# Patient Record
Sex: Male | Born: 1945
Health system: Southern US, Community
[De-identification: ages and names within clinical notes are randomized; demographics above are authoritative.]

## PROBLEM LIST (undated history)

## (undated) DIAGNOSIS — I679 Cerebrovascular disease, unspecified: Secondary | ICD-10-CM

## (undated) DIAGNOSIS — K635 Polyp of colon: Secondary | ICD-10-CM

## (undated) DIAGNOSIS — N529 Male erectile dysfunction, unspecified: Secondary | ICD-10-CM

## (undated) DIAGNOSIS — E663 Overweight: Secondary | ICD-10-CM

## (undated) DIAGNOSIS — D689 Coagulation defect, unspecified: Secondary | ICD-10-CM

## (undated) DIAGNOSIS — J189 Pneumonia, unspecified organism: Secondary | ICD-10-CM

## (undated) DIAGNOSIS — K5790 Diverticulosis of intestine, part unspecified, without perforation or abscess without bleeding: Secondary | ICD-10-CM

## (undated) DIAGNOSIS — L719 Rosacea, unspecified: Secondary | ICD-10-CM

## (undated) DIAGNOSIS — I739 Peripheral vascular disease, unspecified: Secondary | ICD-10-CM

## (undated) DIAGNOSIS — F17201 Nicotine dependence, unspecified, in remission: Secondary | ICD-10-CM

## (undated) DIAGNOSIS — I2510 Atherosclerotic heart disease of native coronary artery without angina pectoris: Secondary | ICD-10-CM

## (undated) DIAGNOSIS — I10 Essential (primary) hypertension: Secondary | ICD-10-CM

## (undated) DIAGNOSIS — E538 Deficiency of other specified B group vitamins: Secondary | ICD-10-CM

## (undated) DIAGNOSIS — K219 Gastro-esophageal reflux disease without esophagitis: Secondary | ICD-10-CM

## (undated) DIAGNOSIS — M1990 Unspecified osteoarthritis, unspecified site: Secondary | ICD-10-CM

## (undated) DIAGNOSIS — Z8489 Family history of other specified conditions: Secondary | ICD-10-CM

## (undated) DIAGNOSIS — D649 Anemia, unspecified: Secondary | ICD-10-CM

## (undated) DIAGNOSIS — K921 Melena: Secondary | ICD-10-CM

## (undated) DIAGNOSIS — E785 Hyperlipidemia, unspecified: Secondary | ICD-10-CM

## (undated) HISTORY — DX: Overweight: E66.3

## (undated) HISTORY — DX: Polyp of colon: K63.5

## (undated) HISTORY — PX: APPENDECTOMY: SHX54

## (undated) HISTORY — DX: Unspecified osteoarthritis, unspecified site: M19.90

## (undated) HISTORY — DX: Nicotine dependence, unspecified, in remission: F17.201

## (undated) HISTORY — DX: Deficiency of other specified B group vitamins: E53.8

## (undated) HISTORY — DX: Rosacea, unspecified: L71.9

## (undated) HISTORY — DX: Peripheral vascular disease, unspecified: I73.9

## (undated) HISTORY — DX: Male erectile dysfunction, unspecified: N52.9

## (undated) HISTORY — DX: Cerebrovascular disease, unspecified: I67.9

## (undated) HISTORY — DX: Anemia, unspecified: D64.9

## (undated) HISTORY — DX: Gastro-esophageal reflux disease without esophagitis: K21.9

## (undated) HISTORY — DX: Hyperlipidemia, unspecified: E78.5

## (undated) HISTORY — DX: Diverticulosis of intestine, part unspecified, without perforation or abscess without bleeding: K57.90

## (undated) HISTORY — DX: Atherosclerotic heart disease of native coronary artery without angina pectoris: I25.10

## (undated) HISTORY — DX: Coagulation defect, unspecified: D68.9

## (undated) HISTORY — DX: Pneumonia, unspecified organism: J18.9

## (undated) HISTORY — DX: Melena: K92.1

---

## 1991-05-09 DIAGNOSIS — I2510 Atherosclerotic heart disease of native coronary artery without angina pectoris: Secondary | ICD-10-CM

## 1991-05-09 HISTORY — DX: Atherosclerotic heart disease of native coronary artery without angina pectoris: I25.10

## 2000-12-25 ENCOUNTER — Ambulatory Visit (HOSPITAL_COMMUNITY): Admission: RE | Admit: 2000-12-25 | Discharge: 2000-12-25 | Payer: Self-pay | Admitting: Cardiology

## 2004-03-03 ENCOUNTER — Inpatient Hospital Stay (HOSPITAL_COMMUNITY): Admission: EM | Admit: 2004-03-03 | Discharge: 2004-03-04 | Payer: Self-pay | Admitting: Emergency Medicine

## 2004-03-03 ENCOUNTER — Encounter (INDEPENDENT_AMBULATORY_CARE_PROVIDER_SITE_OTHER): Payer: Self-pay | Admitting: *Deleted

## 2004-04-27 ENCOUNTER — Ambulatory Visit: Payer: Self-pay | Admitting: Internal Medicine

## 2004-04-27 LAB — CONVERTED CEMR LAB: PSA: 0.33 ng/mL

## 2004-05-16 ENCOUNTER — Ambulatory Visit: Payer: Self-pay | Admitting: Internal Medicine

## 2005-02-14 ENCOUNTER — Ambulatory Visit: Payer: Self-pay | Admitting: Cardiology

## 2005-03-23 ENCOUNTER — Ambulatory Visit: Payer: Self-pay | Admitting: Family Medicine

## 2005-04-28 ENCOUNTER — Ambulatory Visit: Payer: Self-pay | Admitting: Internal Medicine

## 2005-05-22 ENCOUNTER — Ambulatory Visit: Payer: Self-pay | Admitting: Internal Medicine

## 2005-06-01 ENCOUNTER — Ambulatory Visit: Payer: Self-pay | Admitting: Family Medicine

## 2006-02-23 ENCOUNTER — Ambulatory Visit: Payer: Self-pay | Admitting: Cardiology

## 2006-11-30 ENCOUNTER — Encounter: Payer: Self-pay | Admitting: Internal Medicine

## 2006-11-30 DIAGNOSIS — L719 Rosacea, unspecified: Secondary | ICD-10-CM

## 2006-11-30 DIAGNOSIS — K219 Gastro-esophageal reflux disease without esophagitis: Secondary | ICD-10-CM

## 2006-12-07 ENCOUNTER — Ambulatory Visit: Payer: Self-pay | Admitting: Internal Medicine

## 2006-12-07 DIAGNOSIS — E785 Hyperlipidemia, unspecified: Secondary | ICD-10-CM | POA: Insufficient documentation

## 2006-12-07 DIAGNOSIS — E782 Mixed hyperlipidemia: Secondary | ICD-10-CM | POA: Insufficient documentation

## 2006-12-07 LAB — CONVERTED CEMR LAB
BUN: 15 mg/dL (ref 6–23)
CO2: 30 meq/L (ref 19–32)
Cholesterol: 90 mg/dL (ref 0–200)
Glucose, Bld: 101 mg/dL — ABNORMAL HIGH (ref 70–99)
LDL Cholesterol: 33 mg/dL (ref 0–99)
Phosphorus: 3.6 mg/dL (ref 2.3–4.6)
Potassium: 4.8 meq/L (ref 3.5–5.1)
Sodium: 141 meq/L (ref 135–145)
Total CHOL/HDL Ratio: 2.1
Triglycerides: 73 mg/dL (ref 0–149)

## 2007-01-17 ENCOUNTER — Ambulatory Visit: Payer: Self-pay | Admitting: Internal Medicine

## 2007-01-18 ENCOUNTER — Encounter (INDEPENDENT_AMBULATORY_CARE_PROVIDER_SITE_OTHER): Payer: Self-pay | Admitting: *Deleted

## 2007-01-18 ENCOUNTER — Ambulatory Visit (HOSPITAL_COMMUNITY): Admission: RE | Admit: 2007-01-18 | Discharge: 2007-01-18 | Payer: Self-pay | Admitting: Cardiology

## 2007-01-18 ENCOUNTER — Ambulatory Visit: Payer: Self-pay | Admitting: Cardiology

## 2007-06-24 ENCOUNTER — Telehealth (INDEPENDENT_AMBULATORY_CARE_PROVIDER_SITE_OTHER): Payer: Self-pay | Admitting: *Deleted

## 2007-12-23 ENCOUNTER — Telehealth (INDEPENDENT_AMBULATORY_CARE_PROVIDER_SITE_OTHER): Payer: Self-pay | Admitting: *Deleted

## 2008-01-31 ENCOUNTER — Ambulatory Visit: Payer: Self-pay | Admitting: Cardiology

## 2008-02-03 ENCOUNTER — Telehealth: Payer: Self-pay | Admitting: Internal Medicine

## 2008-02-21 ENCOUNTER — Ambulatory Visit: Payer: Self-pay | Admitting: Internal Medicine

## 2008-02-21 DIAGNOSIS — M159 Polyosteoarthritis, unspecified: Secondary | ICD-10-CM | POA: Insufficient documentation

## 2008-02-21 DIAGNOSIS — M170 Bilateral primary osteoarthritis of knee: Secondary | ICD-10-CM

## 2008-02-26 LAB — CONVERTED CEMR LAB
ALT: 28 units/L (ref 0–53)
Alkaline Phosphatase: 40 units/L (ref 39–117)
BUN: 18 mg/dL (ref 6–23)
Bilirubin, Direct: 0.1 mg/dL (ref 0.0–0.3)
Chloride: 102 meq/L (ref 96–112)
Creatinine, Ser: 1 mg/dL (ref 0.4–1.5)
Eosinophils Relative: 2.2 % (ref 0.0–5.0)
GFR calc Af Amer: 97 mL/min
Glucose, Bld: 99 mg/dL (ref 70–99)
HDL: 40.4 mg/dL (ref >39.0)
LDL Cholesterol: 32 mg/dL (ref 0–99)
Monocytes Relative: 11 % (ref 3.0–12.0)
Neutrophils Relative %: 54.9 % (ref 43.0–77.0)
Phosphorus: 3.8 mg/dL (ref 2.3–4.6)
Platelets: 246 10*3/uL (ref 150–400)
RDW: 13.2 % (ref 11.5–14.6)
TSH: 0.82 microintl units/mL (ref 0.35–5.50)
Total Bilirubin: 0.7 mg/dL (ref 0.3–1.2)
Total CHOL/HDL Ratio: 2.1
Total Protein: 7.2 g/dL (ref 6.0–8.3)
VLDL: 13 mg/dL (ref 0–40)
WBC: 7.7 10*3/uL (ref 4.5–10.5)

## 2008-05-25 ENCOUNTER — Ambulatory Visit: Payer: Self-pay | Admitting: Internal Medicine

## 2008-12-07 ENCOUNTER — Ambulatory Visit: Payer: Self-pay | Admitting: Internal Medicine

## 2009-02-01 ENCOUNTER — Encounter: Payer: Self-pay | Admitting: Cardiology

## 2009-03-05 ENCOUNTER — Encounter: Payer: Self-pay | Admitting: Cardiology

## 2009-03-08 ENCOUNTER — Ambulatory Visit: Payer: Self-pay | Admitting: Cardiology

## 2009-03-08 DIAGNOSIS — E669 Obesity, unspecified: Secondary | ICD-10-CM

## 2009-03-15 ENCOUNTER — Ambulatory Visit (HOSPITAL_COMMUNITY): Admission: RE | Admit: 2009-03-15 | Discharge: 2009-03-15 | Payer: Self-pay | Admitting: Cardiology

## 2009-03-18 ENCOUNTER — Telehealth (INDEPENDENT_AMBULATORY_CARE_PROVIDER_SITE_OTHER): Payer: Self-pay | Admitting: *Deleted

## 2009-03-18 ENCOUNTER — Encounter (INDEPENDENT_AMBULATORY_CARE_PROVIDER_SITE_OTHER): Payer: Self-pay | Admitting: *Deleted

## 2009-04-07 ENCOUNTER — Encounter (INDEPENDENT_AMBULATORY_CARE_PROVIDER_SITE_OTHER): Payer: Self-pay | Admitting: *Deleted

## 2009-04-13 ENCOUNTER — Encounter: Payer: Self-pay | Admitting: Cardiology

## 2009-04-13 ENCOUNTER — Telehealth (INDEPENDENT_AMBULATORY_CARE_PROVIDER_SITE_OTHER): Payer: Self-pay | Admitting: *Deleted

## 2009-05-04 ENCOUNTER — Encounter (INDEPENDENT_AMBULATORY_CARE_PROVIDER_SITE_OTHER): Payer: Self-pay | Admitting: *Deleted

## 2009-05-04 ENCOUNTER — Ambulatory Visit: Payer: Self-pay | Admitting: Internal Medicine

## 2009-05-04 LAB — CONVERTED CEMR LAB
AST: 26 units/L
Alkaline Phosphatase: 33 units/L
Cholesterol: 96 mg/dL
Eosinophils Absolute: 0.1 10*3/uL
HCT: 43.1 %
LDL Cholesterol: 30 mg/dL
Lymphs Abs: 2.3 10*3/uL
MCV: 109.8 fL
Monocytes Absolute: 0.9 10*3/uL
Monocytes Relative: 11.3 %
Platelets: 257 10*3/uL
RBC: 3.92 M/uL
Total Protein: 7.5 g/dL
Triglycerides: 89 mg/dL
WBC: 8.4 10*3/uL

## 2009-05-05 LAB — CONVERTED CEMR LAB
AST: 26 units/L (ref 0–37)
BUN: 14 mg/dL (ref 6–23)
Basophils Relative: 0.9 % (ref 0.0–3.0)
CO2: 30 meq/L (ref 19–32)
Chloride: 104 meq/L (ref 96–112)
Eosinophils Relative: 1.7 % (ref 0.0–5.0)
GFR calc non Af Amer: 79.68 mL/min (ref >60)
HCT: 43.1 % (ref 39.0–52.0)
HDL: 48.2 mg/dL (ref >39.00)
Hemoglobin: 14.3 g/dL (ref 13.0–17.0)
LDL Cholesterol: 30 mg/dL (ref 0–99)
Lymphocytes Relative: 27.9 % (ref 12.0–46.0)
Lymphs Abs: 2.3 10*3/uL (ref 0.7–4.0)
Monocytes Relative: 11.3 % (ref 3.0–12.0)
Neutro Abs: 5 10*3/uL (ref 1.4–7.7)
PSA: 0.42 ng/mL (ref 0.10–4.00)
Phosphorus: 3.7 mg/dL (ref 2.3–4.6)
Potassium: 4.2 meq/L (ref 3.5–5.1)
RBC: 3.92 M/uL — ABNORMAL LOW (ref 4.22–5.81)
RDW: 13.3 % (ref 11.5–14.6)
Total Bilirubin: 0.8 mg/dL (ref 0.3–1.2)
Total CHOL/HDL Ratio: 2
Triglycerides: 89 mg/dL (ref 0.0–149.0)
VLDL: 17.8 mg/dL (ref 0.0–40.0)

## 2009-05-26 ENCOUNTER — Ambulatory Visit: Payer: Self-pay | Admitting: Internal Medicine

## 2009-05-28 ENCOUNTER — Encounter: Payer: Self-pay | Admitting: Internal Medicine

## 2009-05-28 LAB — CONVERTED CEMR LAB: Fecal Occult Bld: NEGATIVE

## 2009-09-06 ENCOUNTER — Ambulatory Visit: Payer: Self-pay | Admitting: Family Medicine

## 2009-09-06 DIAGNOSIS — M7660 Achilles tendinitis, unspecified leg: Secondary | ICD-10-CM

## 2009-10-25 ENCOUNTER — Ambulatory Visit: Payer: Self-pay | Admitting: Family Medicine

## 2010-03-07 ENCOUNTER — Encounter (INDEPENDENT_AMBULATORY_CARE_PROVIDER_SITE_OTHER): Payer: Self-pay | Admitting: *Deleted

## 2010-03-10 ENCOUNTER — Encounter (INDEPENDENT_AMBULATORY_CARE_PROVIDER_SITE_OTHER): Payer: Self-pay | Admitting: *Deleted

## 2010-03-11 ENCOUNTER — Ambulatory Visit: Payer: Self-pay | Admitting: Cardiology

## 2010-03-11 DIAGNOSIS — I739 Peripheral vascular disease, unspecified: Secondary | ICD-10-CM

## 2010-03-11 HISTORY — DX: Peripheral vascular disease, unspecified: I73.9

## 2010-03-18 ENCOUNTER — Ambulatory Visit (HOSPITAL_COMMUNITY): Admission: RE | Admit: 2010-03-18 | Discharge: 2010-03-18 | Payer: Self-pay | Admitting: Cardiology

## 2010-03-24 ENCOUNTER — Telehealth (INDEPENDENT_AMBULATORY_CARE_PROVIDER_SITE_OTHER): Payer: Self-pay | Admitting: *Deleted

## 2010-05-06 ENCOUNTER — Ambulatory Visit: Payer: Self-pay | Admitting: Internal Medicine

## 2010-05-08 DIAGNOSIS — K5790 Diverticulosis of intestine, part unspecified, without perforation or abscess without bleeding: Secondary | ICD-10-CM

## 2010-05-08 HISTORY — PX: COLONOSCOPY W/ POLYPECTOMY: SHX1380

## 2010-05-08 HISTORY — DX: Diverticulosis of intestine, part unspecified, without perforation or abscess without bleeding: K57.90

## 2010-05-10 LAB — CONVERTED CEMR LAB
Albumin: 3.9 g/dL (ref 3.5–5.2)
Basophils Relative: 0.6 % (ref 0.0–3.0)
CO2: 28 meq/L (ref 19–32)
Calcium: 9.1 mg/dL (ref 8.4–10.5)
Chloride: 103 meq/L (ref 96–112)
Eosinophils Relative: 1.8 % (ref 0.0–5.0)
HCT: 41.3 % (ref 39.0–52.0)
HDL: 39.4 mg/dL (ref >39.00)
Hemoglobin: 13.7 g/dL (ref 13.0–17.0)
LDL Cholesterol: 38 mg/dL (ref 0–99)
Lymphs Abs: 2.5 10*3/uL (ref 0.7–4.0)
MCV: 110.1 fL — ABNORMAL HIGH (ref 78.0–100.0)
Monocytes Absolute: 1 10*3/uL (ref 0.1–1.0)
Neutro Abs: 5.1 10*3/uL (ref 1.4–7.7)
RBC: 3.75 M/uL — ABNORMAL LOW (ref 4.22–5.81)
Sodium: 138 meq/L (ref 135–145)
Total Bilirubin: 0.7 mg/dL (ref 0.3–1.2)
Total CHOL/HDL Ratio: 2
Triglycerides: 56 mg/dL (ref 0.0–149.0)
WBC: 8.8 10*3/uL (ref 4.5–10.5)

## 2010-05-11 ENCOUNTER — Ambulatory Visit
Admission: RE | Admit: 2010-05-11 | Discharge: 2010-05-11 | Payer: Self-pay | Source: Home / Self Care | Attending: Internal Medicine | Admitting: Internal Medicine

## 2010-05-11 ENCOUNTER — Other Ambulatory Visit: Payer: Self-pay | Admitting: Internal Medicine

## 2010-05-11 DIAGNOSIS — R718 Other abnormality of red blood cells: Secondary | ICD-10-CM | POA: Insufficient documentation

## 2010-05-13 LAB — VITAMIN B12: Vitamin B-12: 230 pg/mL (ref 211–911)

## 2010-05-24 ENCOUNTER — Other Ambulatory Visit: Payer: Self-pay | Admitting: Internal Medicine

## 2010-05-24 ENCOUNTER — Ambulatory Visit
Admission: RE | Admit: 2010-05-24 | Discharge: 2010-05-24 | Payer: Self-pay | Source: Home / Self Care | Attending: Internal Medicine | Admitting: Internal Medicine

## 2010-05-24 DIAGNOSIS — K921 Melena: Secondary | ICD-10-CM | POA: Insufficient documentation

## 2010-05-24 HISTORY — DX: Melena: K92.1

## 2010-05-24 LAB — FECAL OCCULT BLOOD, IMMUNOCHEMICAL: Fecal Occult Bld: POSITIVE

## 2010-06-09 NOTE — Assessment & Plan Note (Signed)
Summary: 1 yr f/u per checkout on 03/08/09/tg      Allergies Added:   Visit Type:  Nurse visit Primary Provider:  Dr. Alphonsus Sias   History of Present Illness: [name] returns to the office as scheduled for reassessment of widespread vascular disease and vascular risk factors.  Since his previous visit one year ago, he has done extremely well.  He is active, including performing yard work, without cardiopulmonary symptoms.  Specifically, he denies chest discomfort, dyspnea, orthopnea, PND, lightheadedness, syncope, and pedal edema.  Blood pressure control has been good.  Lipid profile showed extremely low cholesterol values when assessed one year ago.  Current Medications (verified): 1)  Pravachol 20 Mg  Tabs (Pravastatin Sodium) .... Take One By Mouth Once A Day 2)  Toprol Xl 100 Mg  Tb24 (Metoprolol Succinate) .... Take One By Mouth Once A Day 3)  Aspirin 81 Mg  Tbec (Aspirin) .... Take One By Mouth Once A Day 4)  Cimetidine 400 Mg  Tabs (Cimetidine) .... Take One By Mouth Two Times A Day As Needed 5)  Cialis 20 Mg  Tabs (Tadalafil) .... Use As Directed About 1 Hour Before Sex 6)  Fish Oil 1200 Mg Caps (Omega-3 Fatty Acids) .[name].. 1 Daily By Mouth 7)  Chlorthalidone 25 Mg Tabs (Chlorthalidone) .... Take One Half Tablet By Mouth Daily 8)  Nitrostat 0.4 Mg Subl (Nitroglycerin) .... Take As Directed For Chest Pain  Allergies (verified): 1)  Cardizem Cd (Diltiazem Hcl Coated Beads) 2)  Zocor (Simvastatin)  Comments:  Nurse/Medical Assistant: patient wants to discuss the diffrence between viagra and cialis and which you  think is better also stated that meds are correct he doesn't take glucosamine or  nitroglycerin patches any longer.  Past History:  PMH, FH, and Social History reviewed and updated.  Review of Systems       See history of present illness.  Vital Signs:  Patient profile:   65 year old male Weight:      240 pounds BMI:     34.56 Pulse rate:   81 /  minute BP sitting:   127 / 68  (right arm)  Vitals Entered By: Dreama Saa, CNA (March 11, 2010 3:09 PM)  Nutrition Counseling: Patient's BMI is greater than 25 and therefore counseled on weight management options.  Physical Exam  General:  Overweight; well developed; no acute distress Weight-246, 8 lb. 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  Problem # 2:  HYPERLIPIDEMIA (ICD-272.4) Extremely low values of total and LDL cholesterol on modest pharmacologic therapy, which will be continued.  CHOL: 96 (05/04/2009)   LDL: 30 (05/04/2009)   HDL: 48.20 (05/04/2009)   TG: 89.0 (05/04/2009)  Problem # 3:  HYPERTENSION (ICD-401.1) Blood pressure well controlled with current medications, which will be continued.  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  No specific therapy warranted unless he develops significant  claudication.  I will reassess this nice gentleman in one year.  Problem # 6:  OVERWEIGHT (ICD-278.02) Weight loss advised and diet discussed.  Other Orders: Carotid Duplex (Carotid Duplex)  Patient Instructions: 1)  Your physician recommends that you schedule a follow-up appointment in: 1 year 2)  Your physician recommends that you continue on your current medications as directed. Please refer to the Current Medication list given to you today. 3)  Your physician has requested that you have a carotid duplex. This test is an ultrasound of the carotid arteries in your neck. It looks at blood flow through these arteries that supply the brain with blood. Allow one hour for this exam. There are no restrictions or special instructions.

## 2010-06-09 NOTE — Assessment & Plan Note (Signed)
Summary: CHECK UP/CLE   Vital Signs:  Patient profile:   65 year old male Height:      69 inches Weight:      238 pounds O2 Sat:      92 % on Room air Temp:     98.4 degrees F oral Pulse rate:   76 / minute Pulse rhythm:   regular BP sitting:   130 / 62  (left arm) Cuff size:   large  Vitals Entered By: Mervin Hack CMA Duncan Dull) (May 06, 2010 8:37 AM)  O2 Flow:  Room air CC: adult physical   History of Present Illness: Doing okay  Recent cardiology eval  no problems Carotids were fine doesn't use NTG---taken off list (esp since he occ uses ciallis)  Having ongoing knee pain  takes aleve 220 two times a day  This helps and trying to exercise more now has tried tylenol  still working    Preventive Screening-Counseling & Management  Alcohol-Tobacco     Smoking Status: quit > 6 months  Allergies: 1)  Cardizem Cd (Diltiazem Hcl Coated Beads) 2)  Zocor (Simvastatin)  Past History:  Past medical, surgical, family and social histories (including risk factors) reviewed for relevance to current acute and chronic problems.  Past Medical History: Reviewed history from 03/08/2009 and no changes required. 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 Appendectomy  Family History: Reviewed history from 11/30/2006 and no changes required. Dad died @77  MI, HTN, TIA's Mom died @63  lung cancer HTN in siblings CAD in pat uncles/grandparents  Social History: Reviewed history from 12/07/2006 and no changes  required. Marital Status: Married Children: 3 Occupation: Now with [employer] in [employer] Former Smoker--quit 1992 Alcohol use-no Smoking Status:  quit > 6 months  Review of Systems General:  weight is up a few pounds sleeps okay--occ awakened by knee pain wears seat belt. Eyes:  Denies double vision and vision loss-1 eye. ENT:  Denies decreased hearing and ringing in ears; teeth okay--regular with dentist. CV:  Denies chest pain or discomfort, difficulty breathing at night, difficulty breathing while lying down, fainting, lightheadness, palpitations, and shortness of breath with exertion. Resp:  Denies cough and shortness of breath. GI:  Complains of indigestion; denies abdominal pain, bloody stools, change in bowel habits, dark tarry stools, nausea, and vomiting; occ uses tagamet. GU:  Complains of erectile dysfunction; denies nocturia, urinary frequency, and urinary hesitancy; cialis not that effective. MS:  Complains of joint pain; denies joint swelling. Derm:  Denies lesion(s) and rash. Neuro:  Denies headaches, numbness, and tingling. Psych:  Denies anxiety and depression. Heme:  Denies abnormal bruising and enlarge lymph nodes. Allergy:  Denies seasonal allergies and sneezing.  Physical Exam  General:  alert and normal appearance.   Eyes:  pupils equal, pupils round, pupils reactive to light, and no optic disk abnormalities.   Ears:  R ear normal and L ear normal.   Mouth:  no erythema, no exudates, and no lesions.   Neck:  supple, no masses, no thyromegaly, and no cervical lymphadenopathy.   Lungs:  normal respiratory effort, no intercostal retractions, no accessory muscle use, and normal breath sounds.   Heart:  normal rate,  regular rhythm, no murmur, and no gallop.   Abdomen:  soft, non-tender, and no masses.   Rectal:  deferred after discussion Msk:  no joint tenderness and no joint swelling.   Mild crepitus in left knee Pulses:  feet warm but without  pulses Extremities:  No edema Neurologic:  alert & oriented X3, strength normal in all extremities, and gait normal.   Skin:  no rashes and no suspicious lesions.   Axillary Nodes:  No palpable lymphadenopathy Psych:  normally interactive, good eye contact, not anxious appearing, and not depressed appearing.     Impression & Recommendations:  Problem # 1:  PREVENTIVE HEALTH CARE (ICD-V70.0) Assessment Comment Only  doing well but not overly fit discussed exercise and weight loss he will do stool immunoassay again discussed PSA--he would like to continue for now  Orders: TLB-PSA (Prostate Specific Antigen) (84153-PSA)  Problem # 2:  HYPERTENSION (ICD-401.1) Assessment: Unchanged  good control no changes needed  His updated medication list for this problem includes:    Toprol Xl 100 Mg Tb24 (Metoprolol succinate) .[name]... Take one by mouth once a day    Chlorthalidone 25 Mg Tabs (Chlorthalidone) .[name]... Take one half tablet by mouth daily  BP today: 130/62 Prior BP: 127/68 (03/11/2010)  Labs Reviewed: K+: 4.2 (05/04/2009) Creat: : 1.0 (05/04/2009)   Chol: 96 (05/04/2009)   HDL: 48.20 (05/04/2009)   LDL: 30 (05/04/2009)   TG: 89.0 (05/04/2009)  Orders: TLB-Renal Function Panel (80069-RENAL) TLB-CBC Platelet - w/Differential (85025-CBCD) TLB-TSH (Thyroid Stimulating Hormone) (84443-TSH)  Problem # 3:  HYPERLIPIDEMIA (ICD-272.4) Assessment: Unchanged  very low but approp to continue given vasculopathy  His updated medication list for this problem includes:    Pravachol 20 Mg Tabs (Pravastatin sodium) .[name]... Take one by mouth once a day  Labs Reviewed: SGOT: 26 (05/04/2009)   SGPT: 28 (05/04/2009)   HDL:48.20 (05/04/2009), 48.20 (05/04/2009)  LDL:30 (05/04/2009), 30 (05/04/2009)  Chol:96 (05/04/2009), 96 (05/04/2009)  Trig:89.0 (05/04/2009), 89.0 (05/04/2009)  Orders: TLB-Lipid Panel (80061-LIPID) TLB-Hepatic/Liver Function Pnl (80076-HEPATIC) Venipuncture  (30865)  Problem # 4:  OSTEOARTHRITIS (ICD-715.90) Assessment: Unchanged suggested tylenol instead of aleve for increased cardiac safety  Complete Medication List: 1)  Pravachol 20 Mg Tabs (Pravastatin sodium) .... Take one by mouth once a day 2)  Toprol Xl 100 Mg Tb24 (Metoprolol succinate) .... Take one by mouth once a day 3)  Cimetidine 400 Mg Tabs (Cimetidine) .... Take one by mouth two times a day as needed 4)  Cialis 20 Mg Tabs (Tadalafil) .... Use as directed about 1 hour before sex 5)  Chlorthalidone 25 Mg Tabs (Chlorthalidone) .... Take one half tablet by mouth daily 6)  Aspirin 81 Mg Tbec (Aspirin) .... Take one by mouth once a day 7)  Fish Oil 1200 Mg Caps (Omega-3 fatty acids) .[name].. 1 daily by mouth  Patient Instructions: 1)  Please try tylenol 650mg  three times a day instead of aleve----continue this if it works reasonably well 2)  Please schedule a follow-up appointment in 1 year.  3)  Complete your hemoccult cards and return them soon.    Orders Added: 1)  Est. Patient 65& > [99397] 2)  TLB-PSA (Prostate Specific Antigen) [84153-PSA] 3)  TLB-Lipid Panel [80061-LIPID] 4)  TLB-Hepatic/Liver Function Pnl [80076-HEPATIC] 5)  Venipuncture [36415] 6)  TLB-Renal Function Panel [80069-RENAL] 7)  TLB-CBC Platelet - w/Differential [85025-CBCD] 8)  TLB-TSH (Thyroid Stimulating Hormone) [78469-GEX]    Current Allergies (reviewed today): CARDIZEM CD (DILTIAZEM HCL COATED BEADS) ZOCOR (SIMVASTATIN)

## 2010-06-09 NOTE — Letter (Signed)
Summary: Results Follow up Letter  [place] at [facility]  [address] [place], [place] [address]   Phone: [phone]  Fax: [phone]    05/28/2009 MRN: [id number]  Aaron Sexton [address] [place], [place]  [address]  Dear [name],  The following are the results of your recent test(s):  Test         Result    Pap Smear:        Normal _____  Not Normal _____ Comments: ______________________________________________________ Cholesterol: LDL(Bad cholesterol):         Your goal is less than:         HDL (Good cholesterol):       Your goal is more than: Comments:  ______________________________________________________ Mammogram:        Normal _____  Not Normal _____ Comments:  ___________________________________________________________________ Hemoccult:        Normal __X___  Not normal _______ Comments:  Negative for blood, repeat in 1 year.  _____________________________________________________________________ Other Tests:    We routinely do not discuss normal results over the telephone.  If you desire a copy of the results, or you have any questions about this information we can discuss them at your next office visit.   Sincerely,      Tillman Abide, MD

## 2010-06-09 NOTE — Miscellaneous (Signed)
Summary: labs bmp,cbcd,tsh,lipid,liver,05/04/2009  Clinical Lists Changes  Observations: Added new observation of ALBUMIN: 4.3 g/dL (16/02/9603 54:09) Added new observation of PROTEIN, TOT: 7.5 g/dL (81/19/1478 29:56) Added new observation of SGPT (ALT): 28 units/L (05/04/2009 16:44) Added new observation of SGOT (AST): 26 units/L (05/04/2009 16:44) Added new observation of ALK PHOS: 33 units/L (05/04/2009 16:44) Added new observation of LDL: 30 mg/dL (21/30/8657 84:69) Added new observation of HDL: 48.20 mg/dL (62/95/2841 32:44) Added new observation of TRIGLYC TOT: 89.0 mg/dL (05/10/7251 66:44) Added new observation of CHOLESTEROL: 96 mg/dL (03/47/4259 56:38) Added new observation of TSH: 1.01 microintl units/mL (05/04/2009 16:44) Added new observation of ABSOLUTE BAS: 0.1 K/uL (05/04/2009 16:44) Added new observation of BASOPHIL %: 0.9 % (05/04/2009 16:44) Added new observation of EOS ABSLT: 0.1 K/uL (05/04/2009 16:44) Added new observation of % EOS AUTO: 1.7 % (05/04/2009 16:44) Added new observation of ABSOLUTE MON: 0.9 K/uL (05/04/2009 16:44) Added new observation of MONOCYTE %: 11.3 % (05/04/2009 16:44) Added new observation of ABS LYMPHOCY: 2.3 K/uL (05/04/2009 16:44) Added new observation of LYMPHS %: 27.9 % (05/04/2009 16:44) Added new observation of ABS NEUTROPH: 58.2 K/uL (05/04/2009 16:44) Added new observation of PLATELETK/UL: 257 K/uL (05/04/2009 16:44) Added new observation of RDW: 13.3 % (05/04/2009 16:44) Added new observation of MCHC RBC: 33.1 g/dL (75/64/3329 51:88) Added new observation of MCV: 109.8 fL (05/04/2009 16:44) Added new observation of HCT: 43.1 % (05/04/2009 16:44) Added new observation of HGB: 14.3 g/dL (41/66/0630 16:01) Added new observation of RBC M/UL: 3.92 M/uL (05/04/2009 16:44) Added new observation of WBC COUNT: 8.4 10*3/microliter (05/04/2009 16:44)

## 2010-06-09 NOTE — Letter (Signed)
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

## 2010-06-09 NOTE — Letter (Signed)
Summary: [facility]: Immunoassay Fecal Occult Blood (iFOB) Order Form  [place] at [facility]  [address] [place], [place] [address]   Phone: [phone]  Fax: [phone]      [facility]: Immunoassay Fecal Occult Blood (iFOB) Order Form   May 06, 2010 MRN: [id number]   Aaron Sexton 1945-11-26   Physicican Name:________[place]_________________  Diagnosis Code:________V76.51__________________      Cindee Salt MD

## 2010-06-09 NOTE — Assessment & Plan Note (Signed)
Summary: TENDON HURTING IN LEFT HEEL/JRR   Vital Signs:  Patient profile:   65 year old male Height:      70 inches Weight:      230.4 pounds BMI:     33.18 Temp:     98.2 degrees F oral Pulse rate:   84 / minute Pulse rhythm:   regular BP sitting:   122 / 72  (left arm) Cuff size:   large  Vitals Entered By: Benny Lennert CMA Duncan Dull) (Sep 06, 2009 3:32 PM)  History of Present Illness: Chief complaint tendon in R heel hurting  R heel  achilles  has been ongoing for a few weeks first noticed puffy and tender about six weeks ago   No trauma or injury  Not that active. Will fish. No sports.  no interventions have been tried.  Does have some superfeet orthotics in place  Allergies: 1)  Cardizem Cd (Diltiazem Hcl Coated Beads) 2)  Zocor (Simvastatin)  Past History:  Past medical, surgical, family and social histories (including risk factors) reviewed, and no changes noted (except as noted below).  Past Medical History: Reviewed history from 03/08/2009 and no changes required. 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 Appendectomy  Family History: Reviewed history from 11/30/2006 and no changes required. Dad died @77  MI, HTN, TIA's Mom died @63  lung cancer HTN in siblings CAD in pat uncles/grandparents  Social History: Reviewed history from 12/07/2006 and no changes required. Marital Status: Married Children: 3 Occupation: Now with [employer] in [employer] Former Smoker--quit 1992 Alcohol use-no  Review of  Systems       REVIEW OF SYSTEMS  GEN: No systemic complaints, no fevers, chills, sweats, or other acute illnesses MSK: Detailed in the HPI GI: tolerating PO intake without difficulty Neuro: No numbness, parasthesias, or tingling associated. Otherwise the pertinent positives of the ROS are noted above.    Physical Exam  General:  GEN: Well-developed,well-nourished,in no acute distress; alert,appropriate and cooperative throughout examination HEENT: Normocephalic and atraumatic without obvious abnormalities. No apparent alopecia or balding. Ears, externally no deformities PULM: Breathing comfortably in no respiratory distress EXT: No clubbing, cyanosis, or edema PSYCH: Normally interactive. Cooperative during the interview. Pleasant. Friendly and conversant. Not anxious or depressed appearing. Normal, full affect.  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  Additionally, I have given the patient the program emphasizing eccentric overloading detailed in the instructions based on Dr. Renato Gails work and protocols.  Placed in Tuli's heel cups. NTG patches have been demonstrated to encourage blood flow in chronic tendinopathy, diminish pain, and encourage remodeling of tendon to become more normal.  Counselled absolutely no cialis. Encouraged ice massage.   f/u 6  weeks  Complete Medication List: 1)  Pravachol 20 Mg Tabs (Pravastatin sodium) .... Take one by mouth once a day 2)  Toprol Xl 100 Mg Tb24 (Metoprolol succinate) .... Take one by mouth once a day 3)  Aspirin 81 Mg Tbec (Aspirin) .... Take one by mouth once a day 4)  Cimetidine 400 Mg Tabs (Cimetidine) .... Take one by mouth two times a day as needed 5)  Cialis 20 Mg Tabs (Tadalafil) .... Use as directed about 1 hour before sex 6)  Fish Oil 1200 Mg Caps (Omega-3 fatty acids) .[name].. 1 daily by mouth 7)  Chlorthalidone 25 Mg Tabs (Chlorthalidone) .... Take one half tablet by mouth daily 8)  Nitrostat 0.4 Mg Subl (Nitroglycerin) .... Take as directed for chest pain 9)  Glucosamine 500 Mg Caps (Glucosamine sulfate) .[name].. 1 in am and 1 in pm 10)  Nitroglycerin 0.2 Mg/hr Pt24 (Nitroglycerin) .... Apply 1/4 patch every 24 hours to affected heel  Patient Instructions: 1)  Achilles Rehab 2)  Begin with easy walking, heel, toe and backwards 3)  Calf raises on a step 4)  First lower and then raise on 1 foot 5)  If this is painful lower on 1 foot but do the heel raise on both feet 6)  Begin with 3 sets of 10 repetitions 7)  Increase by 5 repetitions every 3 days 8)  Goal is 3 sets of 30 repetitions 9)  Do with both knee straight and knee at 20 degrees of flexion 10)  If pain persists at 3 sets of 30 - add backpack with 5 lbs 11)  Increase by 5 lbs per week to max of 30 lbs  Prescriptions: NITROGLYCERIN 0.2 MG/HR PT24 (NITROGLYCERIN) Apply 1/4 patch every 24 hours to affected heel  #8 x 1   Entered and Authorized by:   Hannah Beat MD   Signed by:   Hannah Beat MD on 09/06/2009   Method used:   Print then Give to Patient   RxID:   [id number]   Current Allergies (reviewed today): CARDIZEM CD (DILTIAZEM HCL COATED BEADS) ZOCOR (SIMVASTATIN)

## 2010-06-09 NOTE — Assessment & Plan Note (Signed)
Summary: ROA FOR 6 WEEK FOLLOW-UP/JRR   Vital Signs:  Patient profile:   65 year old male Height:      70 inches Weight:      232.4 pounds BMI:     33.47 Temp:     98.4 degrees F oral Pulse rate:   88 / minute Pulse rhythm:   regular BP sitting:   120 / 68  (left arm) Cuff size:   large  Vitals Entered By: Benny Lennert CMA Duncan Dull) (October 25, 2009 3:53 PM)  History of Present Illness: Chief complaint 6 week follow up  R heel  achilles  patient is doing  a fair bit better compared to last time I saw him, he is having some decrease in the swelling, he is been compliant with his  rehabilitation protocol, and  has been using  his superficial orthotics and heel cups in his work shoes.  Additionally, he continues with his nitroglycerin patches without any significant side effects  Does have some superfeet orthotics in place  ROS: no fever, chills, nausea.    Allergies: 1)  Cardizem Cd (Diltiazem Hcl Coated Beads) 2)  Zocor (Simvastatin)  Physical Exam  General:  GEN: Well-developed,well-nourished,in no acute distress; alert,appropriate and cooperative throughout examination HEENT: Normocephalic and atraumatic without obvious abnormalities. Ears, externally no deformities PULM: Breathing comfortably in no respiratory distress EXT: No clubbing, cyanosis, or edema PSYCH: Normally interactive. Cooperative during the interview. Pleasant. Friendly and conversant. Not anxious or depressed appearing. Normal, full affect.  Msk:  R foot Echymosis: no Edema: no ROM: full LE B Gait: heel toe, non-antalgic MT pain: no Lateral Mall: NT Medial Mall: NT Talus: NT Navicular: NT Cuboid: NT Calcaneous: NT Metatarsals: NT 5th MT: NT Phalanges: NT Achilles: minimally painful - decreased nodule size. Plantar Fascia: NT Fat Pad: NT Peroneals: NT Post Tib: NT Great Toe: Nml motion Ant Drawer: neg ATFL: NT CFL: NT Deltoid: NT   Impression & Recommendations:  Problem # 1:   ACHILLES TENDINITIS (ICD-726.71) Advance eccentric overloading based on Dr. Renato Gails work and protocols.  Counselled absolutely no cialis. Encouraged ice massage.   doing a lot better - expect cont improvement  f/u as needed   Complete Medication List: 1)  Pravachol 20 Mg Tabs (Pravastatin sodium) .... Take one by mouth once a day 2)  Toprol Xl 100 Mg Tb24 (Metoprolol succinate) .... Take one by mouth once a day 3)  Aspirin 81 Mg Tbec (Aspirin) .... Take one by mouth once a day 4)  Cimetidine 400 Mg Tabs (Cimetidine) .... Take one by mouth two times a day as needed 5)  Cialis 20 Mg Tabs (Tadalafil) .... Use as directed about 1 hour before sex 6)  Fish Oil 1200 Mg Caps (Omega-3 fatty acids) .[name].. 1 daily by mouth 7)  Chlorthalidone 25 Mg Tabs (Chlorthalidone) .... Take one half tablet by mouth daily 8)  Nitrostat 0.4 Mg Subl (Nitroglycerin) .... Take as directed for chest pain 9)  Glucosamine 500 Mg Caps (Glucosamine sulfate) .[name].. 1 in am and 1 in pm 10)  Nitroglycerin 0.2 Mg/hr Pt24 (Nitroglycerin) .... Apply 1/4 patch every 24 hours to affected heel  Current Allergies (reviewed today): CARDIZEM CD (DILTIAZEM HCL COATED BEADS) ZOCOR (SIMVASTATIN)

## 2010-06-09 NOTE — Progress Notes (Signed)
Summary: PT WOULD LIKE RESULTS   Phone Note Call from Patient Call back at Home Phone [phone]   Caller: PT Reason for Call: Lab or Test Results Summary of Call: PT HAD CARODIT DOPPLERS DONE LAST WEEK AND WOULD LIKE RESULTS LEFT ON V/M IF HE DOESNT ANSWER Initial call taken by: Faythe Ghee,  March 24, 2010 11:17 AM  Follow-up for Phone Call        results called to pts spouse per his request. Follow-up by: Teressa Lower RN,  March 25, 2010 9:57 AM

## 2010-06-09 NOTE — Miscellaneous (Signed)
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  Ankle-brachial indices are moderately depressed at   rest with segmental evaluation consistent with bilateral SFA   occlusive disease and potential component of proximal inflow   disease.  Further anatomic delineation would be helpful with a   study such as CT angiography to determine exact nature of occlusive   disease.    Read By:  Irish Lack,  M.D.   Released By:  Irish Lack,  M.D.  Additional Information  HL7 RESULT STATUS : F  External image : [id number]  External IF Update Timestamp : 2009-03-15:15:03:58.000000  (03/15/2009 10:29) Added new observation of US CAROTID:   Clinical Data: Carotid bruit.  History of hypertension, coronary   angioplasty and tobacco use.    BILATERAL CAROTID DUPLEX ULTRASOUND    Technique: Wallace Cullens scale imaging, color Doppler and duplex ultrasound   was performed of bilateral carotid and vertebral arteries in the   neck.    Comparison: None    Criteria:  Quantification of carotid stenosis is based on velocity   parameters that correlate the residual internal carotid diameter   with NASCET-based stenosis levels, using the diameter of the distal   internal carotid lumen as the denominator for stenosis measurement.    The following velocity measurements were obtained:                     PEAK SYSTOLIC/END DIASTOLIC   RIGHT   ICA:                        139/26cm/sec   CCA:                        104/18cm/sec   SYSTOLIC ICA/CCA RATIO:     1.3   DIASTOLIC ICA/CCA RATIO:    1.5   ECA:                        198cm/sec    LEFT   ICA:                        147/23cm/sec   CCA:                        99/21cm/sec   SYSTOLIC ICA/CCA RATIO:     1.5   DIASTOLIC ICA/CCA RATIO:    1.1   ECA:                         121cm/sec    Findings:    RIGHT CAROTID ARTERY: There is moderate calcified plaque at the   right carotid bifurcation involving the distal bulb and proximal   ICA as well as the external carotid artery origin.  Estimated right   ICA stenosis is 50 - 69% based on velocities. Carotid arteries are   also tortuous consistent with underlying hypertension.    RIGHT VERTEBRAL ARTERY:  Antegrade flow with normal wave form.    LEFT CAROTID ARTERY: Carotid arteries are tortuous.  Mild to   moderate calcified plaque present at the level of the distal bulb   and proximal ICA.  Velocities correspond to an estimated 50 - 69%   stenosis.  LEFT VERTEBRAL ARTERY:  Antegrade flow with normal wave form.    IMPRESSION:   Bilateral atherosclerotic plaque, right greater than left.   Velocities correspond to bilateral estimated 50 - 69% ICA stenoses.  (03/15/2009 10:29)      Carotid Doppler  Procedure date:  03/15/2009  Findings:        Clinical Data: Carotid bruit.  History of hypertension, coronary   angioplasty and tobacco use.    BILATERAL CAROTID DUPLEX ULTRASOUND    Technique: Wallace Cullens scale imaging, color Doppler and duplex ultrasound   was performed of bilateral carotid and vertebral arteries in the   neck.    Comparison: None    Criteria:  Quantification of carotid stenosis is based on velocity   parameters that correlate the residual internal carotid diameter   with NASCET-based stenosis levels, using the diameter of the distal   internal carotid lumen as the denominator for stenosis measurement.    The following velocity measurements were obtained:                     PEAK SYSTOLIC/END DIASTOLIC   RIGHT   ICA:                        139/26cm/sec   CCA:                        104/18cm/sec   SYSTOLIC ICA/CCA RATIO:     1.3   DIASTOLIC ICA/CCA RATIO:    1.5   ECA:                        198cm/sec    LEFT   ICA:                        147/23cm/sec   CCA:                         99/21cm/sec   SYSTOLIC ICA/CCA RATIO:     1.5   DIASTOLIC ICA/CCA RATIO:    1.1   ECA:                        121cm/sec    Findings:    RIGHT CAROTID ARTERY: There is moderate calcified plaque at the   right carotid bifurcation involving the distal bulb and proximal   ICA as well as the external carotid artery origin.  Estimated right   ICA stenosis is 50 - 69% based on velocities. Carotid arteries are   also tortuous consistent with underlying hypertension.    RIGHT VERTEBRAL ARTERY:  Antegrade flow with normal wave form.    LEFT CAROTID ARTERY: Carotid arteries are tortuous.  Mild to   moderate calcified plaque present at the level of the distal bulb   and proximal ICA.  Velocities correspond to an estimated 50 - 69%   stenosis.    LEFT VERTEBRAL ARTERY:  Antegrade flow with normal wave form.    IMPRESSION:   Bilateral atherosclerotic plaque, right greater than left.   Velocities correspond to bilateral estimated 50 - 69% ICA stenoses.   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  Ankle-brachial indices are moderately depressed at   rest with  segmental evaluation consistent with bilateral SFA   occlusive disease and potential component of proximal inflow   disease.  Further anatomic delineation would be helpful with a   study such as CT angiography to determine exact nature of occlusive   disease.    Read By:  Irish Lack,  M.D.   Released By:  Irish Lack,  M.D.  Additional Information  HL7 RESULT STATUS : F  External image : [id number]  External IF Update Timestamp : 2009-03-15:15:03:58.000000

## 2010-07-07 DIAGNOSIS — K635 Polyp of colon: Secondary | ICD-10-CM

## 2010-07-07 HISTORY — DX: Polyp of colon: K63.5

## 2010-07-26 ENCOUNTER — Ambulatory Visit: Payer: BC Managed Care – PPO | Admitting: Gastroenterology

## 2010-07-28 LAB — PATHOLOGY REPORT

## 2010-08-02 ENCOUNTER — Encounter: Payer: Self-pay | Admitting: Internal Medicine

## 2010-08-04 ENCOUNTER — Encounter: Payer: Self-pay | Admitting: Internal Medicine

## 2010-08-09 ENCOUNTER — Ambulatory Visit: Payer: BC Managed Care – PPO | Admitting: Gastroenterology

## 2010-08-24 ENCOUNTER — Encounter: Payer: Self-pay | Admitting: Internal Medicine

## 2010-09-20 NOTE — Letter (Signed)
January 31, 2008    Karie Schwalbe, MD  [address] [place], [place] [address]   RE:  Aaron Sexton, Aaron Sexton  MRN:  [id number]  /  DOB:  Sep 15, 1945   Dear Dr. Alphonsus Sias:   Aaron Sexton returns to the office for continued assessment and treatment  of coronary artery disease, now 16 years following percutaneous  intervention for single-vessel disease of the right coronary artery.  He  continues to do superbly with no cardiopulmonary symptoms.  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  This  was verified in September 2008 by a duplex study.  He continues to be  overweight, but has been eating less and losing some weight.  He remains  active.  His most recent laboratory available to me is from last year at  which time a chemistry profile was normal and lipid profile was  excellent.   CURRENT MEDICATIONS:  1. Aspirin 81 mg daily.  2. Toprol 100 mg daily.  3. Pravastatin 20 mg daily.  4. Fish oil 1200 mg daily.   PHYSICAL EXAMINATION:  GENERAL:  Pleasant gentleman in no acute  distress.  VITAL SIGNS:  The weight is 219, 9 pounds less than last year.  Blood  pressure 150/85, heart rate 75 and regular, respirations 14.  NECK:  No  jugular venous distention; right carotid bruit present.  LUNGS:  Clear.  CARDIAC:  Normal first and second heart sounds; modest systolic ejection  murmur.  ABDOMEN:  Soft and nontender; no organomegaly.  EXTREMITIES:  Distal pulses 1+; trace edema.   IMPRESSION:  Aaron Sexton is doing well overall.  Control of blood  pressure has been borderline.  We will add chlorthalidone 12.5 mg daily.  A chemistry profile is scheduled to be performed within the next few  months by Dr. Alphonsus Sias.  Lipid profile was advised at that time as well.  Otherwise, he will continue his current medication, monitor blood  pressure at home and plan to see me again in 1 year, at which time a  carotid ultrasound study will be repeated.     Sincerely,      Gerrit Friends. Dietrich Pates, MD, [facility]  Electronically Signed    RMR/MedQ  DD: 01/31/2008  DT: 02/01/2008  Job #: [id number]

## 2010-09-20 NOTE — Assessment & Plan Note (Signed)
[facility]                       [place] [facility] OFFICE NOTE   NAME:PRUITTMurrell, Dome                        MRN:          [id number]  DATE:01/18/2007                            DOB:          July 10, 1945    REFERRING PHYSICIAN:  Karie Schwalbe, MD   Mr. Obryan returns to the office for continuing assessment and treatment  of coronary disease, cardiovascular risk factors, and mild  cerebrovascular disease.  Since his last visit, he has done beautifully.  He has not required urgent medical care.  He has not had chest pain nor  dyspnea.  He remains fairly active including doing some work for the  [employer].   CURRENT MEDICATIONS:  1. Aspirin 81 mg daily.  2. Toprol 100 mg daily.  3. Pravastatin 20 mg daily.   Mr. Racca reports recent laboratories with Dr. Alphonsus Sias.  Those results  are not currently available to me but we are seeking them.  His total  cholesterol is quite low at 90 as reported by Dr. Karle Starch secretary.   PHYSICAL EXAMINATION:  GENERAL:  A very pleasant overweight gentleman in  no acute distress.  VITAL SIGNS:  The weight is 228, 6 pounds more than in October 2003.  Blood pressure  145/70, heart rate 72 and regular, respirations 18.  NECK:  No jugular venous distention; faint right carotid bruit.  LUNGS:  Clear.  CARDIAC:  Normal 1st and 2nd heart sounds; modest basilar systolic  ejection murmur; 4th heart sound present.  ABDOMEN:  Soft and nontender; aortic pulsation not palpable; no  organomegaly.  EXTREMITIES:  No edema; distal pulses intact.   IMPRESSION:  Mr. Skoda continues to do remarkably well, now 15 years  following intervention for coronary artery disease.  He has not even  required stress testing since that event.  He tells me that his blood  sugars have been elevated and that you are addressing this with weight  loss and dietary restrictions.  I will plan to reassess this nice  gentleman again in 1 year.     Gerrit Friends. Dietrich Pates, MD, [facility]  Electronically Signed    RMR/MedQ  DD: 01/18/2007  DT: 01/18/2007  Job #: [id number]   cc:   Karie Schwalbe, MD

## 2010-09-23 NOTE — Op Note (Signed)
NAME:  HAIG, SELA NO.:  [id number]   MEDICAL RECORD NO.:  [id number]          PATIENT TYPE:  EMS   LOCATION:  [facility]                         FACILITY:  [facility]   PHYSICIAN:  Jimmye Norman III, M.D.  DATE OF BIRTH:  11-Feb-1946   DATE OF PROCEDURE:  03/04/2004  DATE OF DISCHARGE:                                 OPERATIVE REPORT   PREOPERATIVE DIAGNOSIS:  Acute appendicitis.   POSTOPERATIVE DIAGNOSIS:  Acute appendicitis.   PROCEDURE:  Laparoscopic appendectomy.   SURGEON:  Jimmye Norman, M.D.   ANESTHESIA:  General endotracheal.   ESTIMATED BLOOD LOSS:  100-150 mL.   COMPLICATIONS:  Hemorrhage from mesoappendix, which tore.   CONDITION:  Stable.   INDICATION FOR OPERATION:  The patient is a 65 year old with acute  appendicitis by CT and clinically, who now comes for laparoscopic  appendectomy.   FINDINGS:  The patient had acutely inflamed appendix in the right lower  quadrant with no evidence of perforation or abscess formation.   OPERATION:  The patient was taken to the operating room and placed on the  table in supine position.  After an adequate endotracheal anesthetic was  administered, he was prepped and draped in the usual sterile manner exposing  the midline and the right upper quadrant.   A supraumbilical curvilinear incision was made using a #11 blade and taken  down to the midline fascia.  At the level of the umbilicus there, the  patient had a little fascial defect, which I took advantage of to use the  Optiview cannula to perforate the peritoneal cavity while tenting upon the  anterior abdominal wall using sharp towel clamps.  We then insufflated the  abdomen with carbon dioxide gas with minimal difficulty.  We directly placed  a right upper quadrant 5 mm cannula and a suprapubic 12 mm cannula under  direct vision.  After this was done, we placed the patient in Trendelenburg,  the left side was tilted down and the dissection begun.   The  inflamed appendix was attached by adhesions and inflammatory reaction to  the lateral wall.  We were able to mobilize that medially, subsequently  dissect out a window at the base, and then come across that with an Endo-GIA  with 3.5 mm closure staples.  As we lifted the appendix up in order to pass  the 2.5 mm stapler, the appendix tore away from its mesoappendix  approximately 2-3 cm, causing some bleeding from the mesoappendix.  These  bleeders had to be controlled with Endoclips, which required some time and  most of the blood loss during the case.  We eventually were able to clip the  two bleeders from the mesoappendix which were bleeding mostly and  subsequently confirm no further bleeding using exploration and observation  while irrigating.  We used a total of about 4 L of saline to irrigate.   Once we knew we had control of the mesoappendix with the clips, we then used  an Endo-GIA 2.5 mm stapler to __________ the rest of the mesoappendix.  This  was done without event and  subsequently we irrigated and then came out.   The supraumbilical fascia was closed using a figure-of-eight stitch of 0  Vicryl.  Vicryl 5-0 was used to close the skin and 0.25% Marcaine with  epinephrine was injected at all sites.  All needle counts, sponge counts,  and instrument counts were correct, and sterile dressings were applied.       JW/MEDQ  D:  03/03/2004  T:  03/04/2004  Job:  [id number]

## 2010-09-23 NOTE — H&P (Signed)
NAME:  Aaron Sexton, Aaron Sexton NO.:  [id number]   MEDICAL RECORD NO.:  [id number]          PATIENT TYPE:  EMS   LOCATION:  MAJO                         FACILITY:  [facility]   PHYSICIAN:  Jimmye Norman III, M.D.  DATE OF BIRTH:  1946/03/26   DATE OF ADMISSION:  03/03/2004  DATE OF DISCHARGE:                                HISTORY & PHYSICAL   IDENTIFICATION/CHIEF COMPLAINT:  The patient is a 65 year old male with CT-  diagnosed acute appendicitis.   HISTORY OF PRESENT ILLNESS:  The patient reports getting ill yesterday  evening about 7 p.m. after he had a meal.  It started off with lower  abdominal discomfort with no nausea or vomiting.   The patient was able to go to sleep; however, he did have significant pain  that awakened him at about 1 a.m.  At that time it was suggested to him that  perhaps his abdominal pain was secondary to angina that he had had in the  past and therefore he tried a nitroglycerin tablet which only made him  nauseated and threw up and lost his appetite.  The pain did not subside.  He  came into the emergency room this morning with abdominal pain, had a CT scan  subsequently done eventually, and was found to have acute appendicitis.   PAST MEDICAL HISTORY:  Significant for coronary artery disease and angina  which has been not active.  He has no shortness of breath or chest pain.  He  had an angioplasty 12 years ago by a cardiologist with [facility]  and he has been stable since that time.  He had not taken any nitroglycerin  pills up until yesterday evening when he thought that his abdominal pain was  related to his heart.  His only medication is Toprol-XL.  He is allergic to  [facility] which causes him to have a rash.  No previous surgery.  He is a  nonsmoker, did smoke over 20 years ago, does not take in any alcohol.   REVIEW OF SYSTEMS:  He has had no diarrhea or constipation.  He has had a  decreased appetite and no fevers or chills.   PHYSICAL EXAMINATION:  VITAL SIGNS:  He is afebrile, his other vital signs  are stable.  HEENT:  He is normocephalic and atraumatic and anicteric.  NECK:  Supple.  CHEST:  Clear.  CARDIAC:  No murmurs, gallops, rubs, or heaves.  ABDOMEN:  Soft.  He is tender in the right lower quadrant with some guarding  but no rebound.  He has no positive Rovsing's sign.  RECTAL:  Not performed.   CT scan demonstrates acute appendicitis without evidence of rupture.  His  white blood cell count is 16.9 with a left shift and his hemoglobin is  normal.  UA also shows no evidence of abnormality or UTI.   IMPRESSION:  Acute appendicitis.   PLAN:  Take him to the operating room for an appendectomy laparoscopically.  The risks and benefits have been explained to the patient including the  possibility that we will have to open.  JW/MEDQ  D:  03/03/2004  T:  03/03/2004  Job:  [id number]

## 2010-09-23 NOTE — Letter (Signed)
February 23, 2006     Karie Schwalbe, MD  [address] [place], [place] [address]   RE:  AZAZEL, FRANZE  MRN:  [id number]  /  DOB:  21-Aug-1945   Dear Aaron Sexton:   Mr. Degeorge returns to the office for continued assessment and treatment of  coronary disease.  In the 14 years since his percutaneous intervention, he  has remained asymptomatic.  Control of hyperlipidemia has been excellent  with small doses of statins.  He continues to refrain from cigarette  smoking.  He maintains and extremely positive attitude despite not having  been able to find a job for some time.  He has no health insurance and  requests that expenses be minimized.   CURRENT MEDICATIONS:  1. Aspirin 81 mg daily.  2. Toprol 100 mg daily.  3. Pravastatin 20 mg daily.   PHYSICAL EXAMINATION:  GENERAL:  A very pleasant, somewhat overweight  gentleman in no acute distress.  VITAL SIGNS:  Weight 229, 3 pounds less than last year.  Blood pressure  135/75, heart rate 75 and regular, respirations 16.  NECK:  No jugular venous distention.  Faint bilateral carotid bruits.  LUNGS:  Clear.  CARDIAC:  Normal first and second heart sounds.  ABDOMEN:  Aortic pulsation not palpable.  EXTREMITIES:  Distal pulses intact.  No edema.   IMPRESSION:  Mr. Sheldon continues to do beautifully.  I advised him that it  is time for his carotid ultrasound studies to be repeated.  He asked that  these be deferred until next year.  He requested samples of Viagra, but  these were not available in our office.  We will  continue his current medications, provide him with influenza vaccination and  plan to see this nice gentleman again in one year.    Sincerely,      Gerrit Friends. Dietrich Pates, MD, [facility]    RMR/MedQ  DD: 02/23/2006  DT: 02/26/2006  Job #: [id number]

## 2011-02-28 ENCOUNTER — Encounter: Payer: Self-pay | Admitting: Cardiology

## 2011-03-07 ENCOUNTER — Encounter: Payer: Self-pay | Admitting: Cardiology

## 2011-03-09 ENCOUNTER — Ambulatory Visit (INDEPENDENT_AMBULATORY_CARE_PROVIDER_SITE_OTHER): Payer: BC Managed Care – PPO | Admitting: Cardiology

## 2011-03-09 ENCOUNTER — Encounter: Payer: Self-pay | Admitting: Cardiology

## 2011-03-09 DIAGNOSIS — F528 Other sexual dysfunction not due to a substance or known physiological condition: Secondary | ICD-10-CM

## 2011-03-09 DIAGNOSIS — I679 Cerebrovascular disease, unspecified: Secondary | ICD-10-CM

## 2011-03-09 DIAGNOSIS — E785 Hyperlipidemia, unspecified: Secondary | ICD-10-CM

## 2011-03-09 DIAGNOSIS — E663 Overweight: Secondary | ICD-10-CM

## 2011-03-09 DIAGNOSIS — K219 Gastro-esophageal reflux disease without esophagitis: Secondary | ICD-10-CM

## 2011-03-09 DIAGNOSIS — I739 Peripheral vascular disease, unspecified: Secondary | ICD-10-CM

## 2011-03-09 DIAGNOSIS — M1990 Unspecified osteoarthritis, unspecified site: Secondary | ICD-10-CM

## 2011-03-09 DIAGNOSIS — K921 Melena: Secondary | ICD-10-CM

## 2011-03-09 DIAGNOSIS — I2510 Atherosclerotic heart disease of native coronary artery without angina pectoris: Secondary | ICD-10-CM | POA: Insufficient documentation

## 2011-03-09 NOTE — Assessment & Plan Note (Addendum)
Patient denies claudication at present.  No further evaluation of peripheral vascular disease is warranted.  Palpable distal pulses suggest reasonably good lower extremity circulation.

## 2011-03-09 NOTE — Progress Notes (Signed)
HPI : [name] returns to the office as scheduled for continued assessment and treatment of coronary disease and cardiovascular risk factors.  Since I last saw him one year ago, he has done superbly.  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  Despite an active lifestyle,he experiences no dyspnea, orthopnea, PND, pedal edema, lightheadedness, chest pain or syncope.  He recently underwent colonoscopy without complications and with negative results except for a single benign polyp.  His most recent laboratory studies performed nearly one year ago included an excellent lipid profile normal electrolytes and renal function.  Current Outpatient Prescriptions on File Prior to Visit  Medication Sig Dispense Refill  . aspirin 81 MG tablet Take 81 mg by mouth daily.        . chlorthalidone (HYGROTON) 25 MG tablet Take 12.5 mg by mouth daily.        . cimetidine (TAGAMET) 400 MG tablet Take 400 mg by mouth as directed.        . metoprolol (TOPROL-XL) 100 MG 24 hr tablet Take 100 mg by mouth daily.        . nitroGLYCERIN (NITROSTAT) 0.4 MG SL tablet Place 0.4 mg under the tongue every 5 (five) minutes as needed.        . Omega-3 Fatty Acids (FISH OIL PO) Take by mouth.        . pravastatin (PRAVACHOL) 40 MG tablet Take 40 mg by mouth daily.        . tadalafil (CIALIS) 20 MG tablet Take 20 mg by mouth daily as needed.           Allergies  Allergen Reactions  . Diltiazem Hcl   . Simvastatin     REACTION: Leg cramps      Past medical history, social history, and family history reviewed and updated.  ROS: See history of present illness.  PHYSICAL EXAM: BP 121/72  Pulse 89  Wt 107.956 kg (238 lb) ; decreased 2 pounds since last year General-Well developed; no acute distress Body habitus-overweight Neck-No JVD; no carotid bruits Lungs-clear lung fields; resonant to percussion Cardiovascular-normal PMI; normal S1  and S2; modest systolic ejection murmur Abdomen-normal bowel sounds; soft and non-tender without masses or organomegaly Musculoskeletal-No deformities, no cyanosis or clubbing Neurologic-Normal cranial nerves; symmetric strength and tone Skin-Warm, no significant lesions Extremities-distal pulses intact; trace edema   ASSESSMENT AND PLAN:

## 2011-03-09 NOTE — Assessment & Plan Note (Signed)
Patient has essentially been asymptomatic since percutaneous intervention 20 years ago.  This may be the result of good fortune or good control of cardiovascular risk factors.  We will try to see that both of these continue.

## 2011-03-09 NOTE — Assessment & Plan Note (Signed)
Patient was congratulated on a 2 pound weight loss and encouraged to restrict calories and exercise to achieve further weight reduction.

## 2011-03-09 NOTE — Assessment & Plan Note (Signed)
Total and LDL cholesterol are extremely low while HDL is quite good.  Current therapy will be continued.

## 2011-03-09 NOTE — Assessment & Plan Note (Signed)
Asymptomatic cerebrovascular disease of moderate severity.  A repeat carotid ultrasound will be obtained within the next 12 months.

## 2011-03-09 NOTE — Patient Instructions (Signed)
Your physician recommends that you schedule a follow-up appointment in: 12 months with Dr Dietrich Pates  Your physician has requested that you have a carotid duplex in 6 months. This test is an ultrasound of the carotid arteries in your neck. It looks at blood flow through these arteries that supply the brain with blood. Allow one hour for this exam. There are no restrictions or special instructions.

## 2011-05-10 ENCOUNTER — Encounter: Payer: Self-pay | Admitting: Internal Medicine

## 2011-05-15 ENCOUNTER — Ambulatory Visit (INDEPENDENT_AMBULATORY_CARE_PROVIDER_SITE_OTHER): Payer: BC Managed Care – PPO | Admitting: Internal Medicine

## 2011-05-15 ENCOUNTER — Encounter: Payer: Self-pay | Admitting: Internal Medicine

## 2011-05-15 VITALS — BP 140/74 | HR 73 | Temp 98.5°F | Ht 69.0 in | Wt 234.0 lb

## 2011-05-15 DIAGNOSIS — M1990 Unspecified osteoarthritis, unspecified site: Secondary | ICD-10-CM

## 2011-05-15 DIAGNOSIS — D126 Benign neoplasm of colon, unspecified: Secondary | ICD-10-CM | POA: Insufficient documentation

## 2011-05-15 DIAGNOSIS — E785 Hyperlipidemia, unspecified: Secondary | ICD-10-CM

## 2011-05-15 DIAGNOSIS — Z0000 Encounter for general adult medical examination without abnormal findings: Secondary | ICD-10-CM | POA: Insufficient documentation

## 2011-05-15 DIAGNOSIS — E538 Deficiency of other specified B group vitamins: Secondary | ICD-10-CM | POA: Insufficient documentation

## 2011-05-15 DIAGNOSIS — I2510 Atherosclerotic heart disease of native coronary artery without angina pectoris: Secondary | ICD-10-CM

## 2011-05-15 LAB — CBC WITH DIFFERENTIAL/PLATELET
Basophils Absolute: 0 10*3/uL (ref 0.0–0.1)
Basophils Relative: 0.5 % (ref 0.0–3.0)
Eosinophils Absolute: 0.1 10*3/uL (ref 0.0–0.7)
Lymphocytes Relative: 24.2 % (ref 12.0–46.0)
MCHC: 33.6 g/dL (ref 30.0–36.0)
Neutrophils Relative %: 65 % (ref 43.0–77.0)
RBC: 3.61 Mil/uL — ABNORMAL LOW (ref 4.22–5.81)
RDW: 14.3 % (ref 11.5–14.6)

## 2011-05-15 LAB — PSA: PSA: 0.32 ng/mL (ref 0.10–4.00)

## 2011-05-15 LAB — LIPID PANEL
HDL: 45.7 mg/dL (ref >39.00)
LDL Cholesterol: 41 mg/dL (ref 0–99)
Total CHOL/HDL Ratio: 2
VLDL: 17 mg/dL (ref 0.0–40.0)

## 2011-05-15 LAB — VITAMIN B12: Vitamin B-12: 697 pg/mL (ref 211–911)

## 2011-05-15 LAB — BASIC METABOLIC PANEL
CO2: 29 mEq/L (ref 19–32)
Calcium: 9 mg/dL (ref 8.4–10.5)
Creatinine, Ser: 0.9 mg/dL (ref 0.4–1.5)

## 2011-05-15 NOTE — Progress Notes (Signed)
Subjective:    Patient ID: Aaron Sexton, male    DOB: [birth year], 66 y.o.   MRN: [id number]  HPI Doing well Still with shoulder pain Did have cortisone shots in both shoulders 20 years ago and this helped Uses acetaminophen with some help---mostly helps knees but not shoulders  No other acute concerns  No changes in heart Continues to see Dr Dietrich Pates  Discussed PSA--he feels he wants to continue that Asks about testosterone levels---he is satisfied with cialis. No reason to check  Retiring in several months  Current Outpatient Prescriptions on File Prior to Visit  Medication Sig Dispense Refill  . aspirin 81 MG tablet Take 81 mg by mouth daily.        . chlorthalidone (HYGROTON) 25 MG tablet Take 12.5 mg by mouth daily.        . cimetidine (TAGAMET) 400 MG tablet Take 400 mg by mouth as directed.        . metoprolol (TOPROL-XL) 100 MG 24 hr tablet Take 100 mg by mouth daily.        . nitroGLYCERIN (NITROSTAT) 0.4 MG SL tablet Place 0.4 mg under the tongue every 5 (five) minutes as needed.        . Omega-3 Fatty Acids (FISH OIL PO) Take by mouth.        . pravastatin (PRAVACHOL) 40 MG tablet Take 40 mg by mouth daily.        . tadalafil (CIALIS) 20 MG tablet Take 20 mg by mouth daily as needed.          Allergies  Allergen Reactions  . Diltiazem Hcl   . Simvastatin     REACTION: Leg cramps    Past Medical History  Diagnosis Date  . Diverticulosis 2012    found on colonoscopy  . Colon polyps 3/12    single 2mm polyp--tubular adenoma  . GERD (gastroesophageal reflux disease)   . Arteriosclerotic cardiovascular disease (ASCVD) 1993    Critical RCA disease in 1993 treated with PTCA  . Hyperlipidemia   . Erectile dysfunction   . Osteoarthritis     knees/hands-Dr Sherlean Foot  . Cerebrovascular disease     Moderate ASVD without focal stenosis in 01/2007  . Tobacco abuse, in remission     30-40 pack years discontinued in 1993  . Overweight   . Rosacea   . Hematochezia  05/24/2010  . Peripheral vascular disease 03/11/2010    Moderate SFA stenosis; history of claudication  . Vitamin B12 deficiency     Past Surgical History  Procedure Date  . Appendectomy     Family History  Problem Relation Age of Onset  . Hypertension Father     And siblings  . Heart disease Father     And second-degree relatives  . Transient ischemic attack Father   . Lung cancer Mother     History   Social History  . Marital Status: Married    Spouse Name: N/A    Number of Children: N/A  . Years of Education: N/A   Occupational History  . Manages supply [employer]   Social History Main Topics  . Smoking status: Former Smoker -- 1.0 packs/day for 30 years  . Smokeless tobacco: Never Used   Comment: Quit in 1993  . Alcohol Use: No  . Drug Use: Not on file  . Sexually Active: Not on file   Other Topics Concern  . Not on file   Social History Narrative  .  No narrative on file   Review of Systems  Constitutional: Negative for fatigue and unexpected weight change.       Wears seat belt No falls and not worried about falling May feel tired after a long day's work  HENT: Negative for hearing loss, congestion, rhinorrhea, dental problem and tinnitus.        Regular with dentist---has top and bottom partials  Eyes: Negative for visual disturbance.       No unilateral vision loss or diplopia  Respiratory: Negative for cough, chest tightness and shortness of breath.   Cardiovascular: Negative for chest pain, palpitations and leg swelling.  Gastrointestinal: Negative for abdominal pain, constipation and blood in stool.       Usually has 4-5 stools per day. Normal for him No sig heartburn---rarely will use OTC tagamet  Genitourinary: Negative for urgency, frequency and difficulty urinating.       Still satisfied with cialis  Musculoskeletal: Positive for arthralgias. Negative for back pain and joint swelling.       Shoulder and knee pain  regularly  Skin: Negative for pallor and rash.       No suspicious lesions Chronic age spots and skin tags  Neurological: Negative for dizziness, syncope, weakness, light-headedness, numbness and headaches.  Hematological: Negative for adenopathy. Does not bruise/bleed easily.  Psychiatric/Behavioral: Negative for sleep disturbance and dysphoric mood. The patient is not nervous/anxious.        Objective:   Physical Exam  Constitutional: He is oriented to person, place, and time. He appears well-developed and well-nourished. No distress.  HENT:  Head: Normocephalic and atraumatic.  Right Ear: External ear normal.  Left Ear: External ear normal.  Mouth/Throat: Oropharynx is clear and moist. No oropharyngeal exudate.       TMs normal  Eyes: Conjunctivae and EOM are normal. Pupils are equal, round, and reactive to light.       Fundi benign  Neck: Normal range of motion. Neck supple. No thyromegaly present.  Cardiovascular: Normal rate, regular rhythm and normal heart sounds.  Exam reveals no gallop.   No murmur heard. Pulmonary/Chest: Effort normal and breath sounds normal. No respiratory distress. He has no wheezes. He has no rales.  Abdominal: Soft. There is no tenderness.  Musculoskeletal: Normal range of motion. He exhibits no edema and no tenderness.  Lymphadenopathy:    He has no cervical adenopathy.  Neurological: He is alert and oriented to person, place, and time.  Skin: Skin is warm. No rash noted. No erythema.  Psychiatric: He has a normal mood and affect. His behavior is normal. Judgment and thought content normal.          Assessment & Plan:

## 2011-05-15 NOTE — Patient Instructions (Signed)
Please set up 15 minute visit at your convenience to do shoulder injection

## 2011-05-15 NOTE — Assessment & Plan Note (Signed)
Lab Results  Component Value Date   LDLCALC 38 05/06/2010   Good control Due for recheck

## 2011-05-15 NOTE — Assessment & Plan Note (Signed)
Will schedule shoulder injections

## 2011-05-15 NOTE — Assessment & Plan Note (Signed)
Sig macrocytosis last year Will recheck on oral therapy

## 2011-05-15 NOTE — Assessment & Plan Note (Signed)
Seems to be quiet Dr Dietrich Pates follows

## 2011-05-15 NOTE — Assessment & Plan Note (Signed)
Doing okay but not in very good shape Discussed fitness He doesn't want zostavax Will check PSA

## 2011-05-16 LAB — HEPATIC FUNCTION PANEL
Bilirubin, Direct: 0.1 mg/dL (ref 0.0–0.3)
Total Bilirubin: 0.6 mg/dL (ref 0.3–1.2)

## 2011-05-25 ENCOUNTER — Ambulatory Visit (INDEPENDENT_AMBULATORY_CARE_PROVIDER_SITE_OTHER): Payer: BC Managed Care – PPO | Admitting: Internal Medicine

## 2011-05-25 ENCOUNTER — Encounter: Payer: Self-pay | Admitting: Internal Medicine

## 2011-05-25 DIAGNOSIS — M19019 Primary osteoarthritis, unspecified shoulder: Secondary | ICD-10-CM

## 2011-05-25 NOTE — Patient Instructions (Signed)
Please call for appointment if you want the right shoulder injected

## 2011-05-25 NOTE — Progress Notes (Signed)
Subjective:    Patient ID: Aaron Sexton, male    DOB: April 21, 1946, 66 y.o.   MRN: [id number]  HPI Ongoing bad pain in left >right shoulder Helped by cortisone in the past Here for injection   Review of Systems     Objective:   Physical Exam        Assessment & Plan:

## 2011-05-25 NOTE — Assessment & Plan Note (Signed)
Procedure  Sterile prep with posterior approach to left shoulder Local anaesthesia with 1.5cc plain 2% lidocaine Some spurs encountered but then shoulder entered cleanly 40mg  kenalog and 8cc 2% lidocaine Instilled Tolerated well Home care discussed

## 2011-06-16 ENCOUNTER — Other Ambulatory Visit: Payer: Self-pay | Admitting: *Deleted

## 2011-06-16 ENCOUNTER — Other Ambulatory Visit: Payer: Self-pay | Admitting: Cardiology

## 2011-06-16 MED ORDER — PRAVASTATIN SODIUM 40 MG PO TABS: 40.0000 mg | ORAL_TABLET | Freq: Every day | ORAL | Status: DC

## 2011-08-08 ENCOUNTER — Encounter: Payer: Self-pay | Admitting: Cardiology

## 2011-08-08 ENCOUNTER — Ambulatory Visit (HOSPITAL_COMMUNITY)
Admission: RE | Admit: 2011-08-08 | Discharge: 2011-08-08 | Disposition: A | Discharge status: Home / Self Care | Payer: BC Managed Care – PPO | Source: Ambulatory Visit | Attending: Cardiology | Admitting: Cardiology

## 2011-08-08 DIAGNOSIS — I679 Cerebrovascular disease, unspecified: Secondary | ICD-10-CM

## 2011-08-08 DIAGNOSIS — I6529 Occlusion and stenosis of unspecified carotid artery: Secondary | ICD-10-CM | POA: Insufficient documentation

## 2011-08-08 DIAGNOSIS — I10 Essential (primary) hypertension: Secondary | ICD-10-CM | POA: Insufficient documentation

## 2011-08-08 DIAGNOSIS — F17200 Nicotine dependence, unspecified, uncomplicated: Secondary | ICD-10-CM | POA: Insufficient documentation

## 2011-10-03 ENCOUNTER — Other Ambulatory Visit: Payer: Self-pay | Admitting: Cardiology

## 2011-10-03 ENCOUNTER — Encounter: Payer: Self-pay | Admitting: *Deleted

## 2011-10-03 ENCOUNTER — Ambulatory Visit (INDEPENDENT_AMBULATORY_CARE_PROVIDER_SITE_OTHER): Payer: BC Managed Care – PPO | Admitting: Family Medicine

## 2011-10-03 ENCOUNTER — Encounter: Payer: Self-pay | Admitting: Family Medicine

## 2011-10-03 VITALS — BP 160/78 | HR 96 | Temp 98.8°F | Wt 235.2 lb

## 2011-10-03 DIAGNOSIS — J069 Acute upper respiratory infection, unspecified: Secondary | ICD-10-CM | POA: Insufficient documentation

## 2011-10-03 DIAGNOSIS — J40 Bronchitis, not specified as acute or chronic: Secondary | ICD-10-CM

## 2011-10-03 MED ORDER — METOPROLOL SUCCINATE ER 100 MG PO TB24: 100.0000 mg | ORAL_TABLET | Freq: Every day | ORAL | Status: DC

## 2011-10-03 MED ORDER — HYDROCOD POLST-CHLORPHEN POLST 10-8 MG/5ML PO LQCR: 5.0000 mL | Freq: Every evening | ORAL | Status: DC | PRN

## 2011-10-03 MED ORDER — AZITHROMYCIN 250 MG PO TABS: ORAL_TABLET | ORAL | Status: AC

## 2011-10-03 NOTE — Telephone Encounter (Signed)
Per pt please call in today he is going to town this afternoon/tmj

## 2011-10-03 NOTE — Progress Notes (Signed)
Subjective:    Patient ID: Aaron Sexton, male    DOB: 1945-10-28, 66 y.o.   MRN: [id number]  HPI CC: cough "i haven't slept in 4 nights"  6d h/o mild cough, progressively worsening.  Mildly productive of sputum.  Trouble sleeping at night.  Worse when laying flat.  Mild HA with coughing.  Has not tried OTC med other than halls.  No fevers/chills, abd pain, nausea, ST, ear or tooth pain.  No PNdrainage.  No smokers at home.  No sick contacts.  No h/o asthma, COPD.  Recent PNA exposure.  Past Medical History  Diagnosis Date  . Diverticulosis 2012    found on colonoscopy  . Colon polyps 3/12    single 2mm polyp--tubular adenoma  . GERD (gastroesophageal reflux disease)   . Arteriosclerotic cardiovascular disease (ASCVD) 1993    Critical RCA disease in 1993 treated with PTCA  . Hyperlipidemia   . Erectile dysfunction   . Osteoarthritis     knees/hands-Dr Sherlean Foot  . Cerebrovascular disease     Moderate ASVD without focal stenosis in 01/2007  . Tobacco abuse, in remission     30-40 pack years discontinued in 1993  . Overweight   . Rosacea   . Hematochezia 05/24/2010  . Peripheral vascular disease 03/11/2010    Moderate SFA stenosis; history of claudication  . Vitamin B12 deficiency      Review of Systems Per HPI    Objective:   Physical Exam  Nursing note and vitals reviewed. Constitutional: He appears well-developed and well-nourished. No distress.       Evidently congested  HENT:  Head: Normocephalic and atraumatic.  Right Ear: Tympanic membrane, external ear and ear canal normal.  Left Ear: Tympanic membrane, external ear and ear canal normal.  Nose: Nose normal. No mucosal edema or rhinorrhea. Right sinus exhibits no maxillary sinus tenderness and no frontal sinus tenderness. Left sinus exhibits no maxillary sinus tenderness and no frontal sinus tenderness.  Mouth/Throat: Oropharynx is clear and moist. No oropharyngeal exudate.  Eyes: Conjunctivae and EOM are  normal. Pupils are equal, round, and reactive to light. No scleral icterus.  Neck: Normal range of motion. Neck supple.  Cardiovascular: Normal rate, regular rhythm, normal heart sounds and intact distal pulses.   No murmur heard. Pulmonary/Chest: Effort normal and breath sounds normal. No respiratory distress. He has no wheezes. He has no rales.       Slightly coarse at LUL  Lymphadenopathy:    He has no cervical adenopathy.  Skin: Skin is warm and dry. No rash noted.      Assessment & Plan:

## 2011-10-03 NOTE — Patient Instructions (Signed)
I think you have bronchitis. Treat with zpack and tussionex for cough at night. Push fluids and rest. If not improving as expected or any worsening cough, fever >101.5, or other concerns, please return to see [place].

## 2011-10-03 NOTE — Assessment & Plan Note (Signed)
Anticipate bronchitis. Given relative mild hypoxia and progression of sxs, will cover with zpack. Update [place] if not improving as expected. tussionex for cough at night.

## 2011-10-06 ENCOUNTER — Ambulatory Visit: Payer: BC Managed Care – PPO | Admitting: Family Medicine

## 2011-11-02 ENCOUNTER — Encounter: Payer: Self-pay | Admitting: Family Medicine

## 2011-11-02 ENCOUNTER — Ambulatory Visit (INDEPENDENT_AMBULATORY_CARE_PROVIDER_SITE_OTHER): Payer: BC Managed Care – PPO | Admitting: Family Medicine

## 2011-11-02 VITALS — BP 140/76 | HR 85 | Temp 97.9°F | Wt 234.8 lb

## 2011-11-02 DIAGNOSIS — J069 Acute upper respiratory infection, unspecified: Secondary | ICD-10-CM

## 2011-11-02 MED ORDER — HYDROCOD POLST-CHLORPHEN POLST 10-8 MG/5ML PO LQCR: 5.0000 mL | Freq: Every evening | ORAL | Status: DC | PRN

## 2011-11-02 NOTE — Progress Notes (Signed)
Subjective:    Patient ID: Aaron Sexton, male    DOB: October 08, 1945, 66 y.o.   MRN: [id number]  HPI CC: f/u bronchitis  Pleasant 66 yo pt of Dr. Karle Sexton presents as f/u after seen here 10/03/2011 with dx bronchitis and treatment with zpack and tussionex at night.  This did improve after initial treatment.  Now sxs returning for last 2 days.  Still has tussionex at home - takes at night and able to rest well.  Has run out.  Currently mostly dry cough, no sputum production  No fevers/chills, abd pain, nausea, ST, ear or tooth pain. No PNdrainage.  No HA.  No chest pain/tightness, SOB.  No nasal congestion  No smokers at home. No sick contacts. No h/o asthma, COPD.  At baseline no cough.  Has been working outside more, but no h/o allergies.   Past Medical History  Diagnosis Date  . Diverticulosis 2012    found on colonoscopy  . Colon polyps 3/12    single 2mm polyp--tubular adenoma  . GERD (gastroesophageal reflux disease)   . Arteriosclerotic cardiovascular disease (ASCVD) 1993    Critical RCA disease in 1993 treated with PTCA  . Hyperlipidemia   . Erectile dysfunction   . Osteoarthritis     knees/hands-Dr Aaron Sexton  . Cerebrovascular disease     Moderate ASVD without focal stenosis in 01/2007  . Tobacco abuse, in remission     30-40 pack years discontinued in 1993  . Overweight   . Rosacea   . Hematochezia 05/24/2010  . Peripheral vascular disease 03/11/2010    Moderate SFA stenosis; history of claudication  . Vitamin B12 deficiency      Review of Systems Per HPI    Objective:   Physical Exam  Nursing note and vitals reviewed. Constitutional: He appears well-developed and well-nourished. No distress.       Mild dry cough present  HENT:  Head: Normocephalic and atraumatic.  Right Ear: Hearing, tympanic membrane, external ear and ear canal normal.  Left Ear: Hearing, tympanic membrane, external ear and ear canal normal.  Nose: Nose normal. No mucosal edema or rhinorrhea.  Right sinus exhibits no maxillary sinus tenderness and no frontal sinus tenderness. Left sinus exhibits no maxillary sinus tenderness and no frontal sinus tenderness.  Mouth/Throat: Uvula is midline and oropharynx is clear and moist. No oropharyngeal exudate, posterior oropharyngeal edema, posterior oropharyngeal erythema or tonsillar abscesses.  Eyes: Conjunctivae and EOM are normal. Pupils are equal, round, and reactive to light. No scleral icterus.  Neck: Normal range of motion. Neck supple.  Cardiovascular: Normal rate, regular rhythm, normal heart sounds and intact distal pulses.   No murmur heard. Pulmonary/Chest: Effort normal and breath sounds normal. No respiratory distress. He has no wheezes. He has no rales.  Lymphadenopathy:    He has no cervical adenopathy.  Skin: Skin is warm and dry. No rash noted.      Assessment & Plan:

## 2011-11-02 NOTE — Patient Instructions (Signed)
I think this is viral upper respiratory infection with cough.  Treat with tussionex at night. May use simple mucinex or immediate release guaifenesin with plenty of fluid to mobilize mucous. Get plenty of rest. Antibiotics are not needed for this.  Viral infections usually take 7-10 days to resolve.  The cough can last a few weeks to go away. Please return if not feeling better in 1 week or if any fever >101.5, worsening productive cough. Call clinic with questions.  Good to see you today.

## 2011-11-02 NOTE — Assessment & Plan Note (Signed)
Anticipate viral uri with cough - treat supportively as per instructions. Discussed red flags to return or notify us.

## 2011-12-07 ENCOUNTER — Other Ambulatory Visit: Payer: Self-pay | Admitting: *Deleted

## 2011-12-07 MED ORDER — TADALAFIL 20 MG PO TABS: 20.0000 mg | ORAL_TABLET | Freq: Every day | ORAL | Status: DC | PRN

## 2012-03-13 ENCOUNTER — Encounter: Payer: Self-pay | Admitting: Cardiology

## 2012-03-13 ENCOUNTER — Ambulatory Visit (INDEPENDENT_AMBULATORY_CARE_PROVIDER_SITE_OTHER): Payer: BC Managed Care – PPO | Admitting: Cardiology

## 2012-03-13 VITALS — BP 128/82 | HR 73 | Ht 70.5 in | Wt 233.0 lb

## 2012-03-13 DIAGNOSIS — I7090 Unspecified atherosclerosis: Secondary | ICD-10-CM

## 2012-03-13 DIAGNOSIS — Z9189 Other specified personal risk factors, not elsewhere classified: Secondary | ICD-10-CM

## 2012-03-13 DIAGNOSIS — M1990 Unspecified osteoarthritis, unspecified site: Secondary | ICD-10-CM

## 2012-03-13 DIAGNOSIS — E785 Hyperlipidemia, unspecified: Secondary | ICD-10-CM

## 2012-03-13 DIAGNOSIS — Z9289 Personal history of other medical treatment: Secondary | ICD-10-CM

## 2012-03-13 DIAGNOSIS — I2510 Atherosclerotic heart disease of native coronary artery without angina pectoris: Secondary | ICD-10-CM

## 2012-03-13 DIAGNOSIS — I739 Peripheral vascular disease, unspecified: Secondary | ICD-10-CM

## 2012-03-13 DIAGNOSIS — I679 Cerebrovascular disease, unspecified: Secondary | ICD-10-CM

## 2012-03-13 NOTE — Progress Notes (Signed)
Patient ID: LYMON KIDNEY, male   DOB: 1946/01/30, 66 y.o.   MRN: [id number]  HPI: Scheduled annual visit for this delightful gentleman with long-standing coronary artery disease.  Since last year, he has continued to do extremely well with good control blood pressure, no new medical problems and no manifestations of atherosclerosis.  Carotid ultrasound 6 months ago revealed stable moderate disease.  He is retired from his position with the state, but remains active, performing maintenance chores around the home and fishing.  The only medical contacts he has had for a specific issue over the past year was for drainage of a sebaceous cyst.  Prior to Admission medications   Medication Sig Start Date End Date Taking? Authorizing Provider  aspirin 81 MG tablet Take 81 mg by mouth daily.     Yes Historical Provider, MD  chlorpheniramine-HYDROcodone (TUSSIONEX) 10-8 MG/5ML LQCR Take 5 mLs by mouth at bedtime as needed. Sedation precautions 11/02/11  Yes Eustaquio Boyden, MD  chlorthalidone (HYGROTON) 25 MG tablet TAKE 1/2 TABLET BY MOUTH EVERY DAY 06/16/11  Yes Kathlen Brunswick, MD  cimetidine (TAGAMET) 400 MG tablet Take 400 mg by mouth as directed.     Yes Historical Provider, MD  doxycycline (DORYX) 100 MG EC tablet Take 100 mg by mouth daily.   Yes Historical Provider, MD  metoprolol succinate (TOPROL-XL) 100 MG 24 hr tablet Take 1 tablet (100 mg total) by mouth daily. Take with or immediately following a meal. 10/03/11  Yes Kathlen Brunswick, MD  nitroGLYCERIN (NITROSTAT) 0.4 MG SL tablet Place 0.4 mg under the tongue every 5 (five) minutes as needed.     Yes Historical Provider, MD  Omega-3 Fatty Acids (FISH OIL PO) Take by mouth.     Yes Historical Provider, MD  pravastatin (PRAVACHOL) 20 MG tablet Take 20 mg by mouth daily.   Yes Historical Provider, MD  tadalafil (CIALIS) 20 MG tablet Take 1 tablet (20 mg total) by mouth daily as needed. 12/07/11  Yes Kathlen Brunswick, MD  vitamin B-12  (CYANOCOBALAMIN) 1000 MCG tablet Take 1,000 mcg by mouth daily.     Yes Historical Provider, MD   Allergies  Allergen Reactions  . Diltiazem Hcl      Past medical history, social history, and family history reviewed and updated.  ROS: Denies chest pain, orthopnea, PND, dyspnea, palpitations, lightheadedness or syncope.  He experiences burning discomfort in the soles of his feet after walking approximately 1/4 mile it resolves promptly with rest.  All other systems reviewed and are negative.  PHYSICAL EXAM: Ht 5' 10.5" (1.791 m)  Wt 105.688 kg (233 lb)  BMI 32.96 kg/m2  General-Well developed; no acute distress Body habitus-Moderately overweight Neck-No JVD; Faint right carotid bruit Lungs-clear lung fields; resonant to percussion Cardiovascular-normal PMI; normal S1 and S2; minimal systolic murmur at the left sternal border Abdomen-normal bowel sounds; soft and non-tender without masses or organomegaly Musculoskeletal-No deformities, no cyanosis or clubbing Neurologic-Normal cranial nerves; symmetric strength and tone; vibratory sense in lower extremities normal Skin-Warm, no significant lesions Extremities-1-2+distal pulses; no edema  ASSESSMENT AND PLAN:  Pueblito Bing, MD 03/13/2012 1:34 PM

## 2012-03-13 NOTE — Assessment & Plan Note (Addendum)
No neurologic symptoms.  Moderate cerebrovascular disease that has been stable since 2011.  Repeat carotid ultrasound will be obtained in approximately 2 years.

## 2012-03-13 NOTE — Assessment & Plan Note (Signed)
Excellent lipid profile less than one year ago.  Current therapy appears quite efficacious.

## 2012-03-13 NOTE — Assessment & Plan Note (Signed)
Patient remains asymptomatic.  We will continue to focus our attention on optimal control of cardiovascular risk factors.

## 2012-03-13 NOTE — Progress Notes (Deleted)
Name: Aaron Sexton    DOB: [birth year]  Age: 66 y.o.  MR#: [id number]       PCP:  Tillman Abide, MD      Insurance: @PAYORNAME @   MEDICATION LIST  CC:    Chief Complaint  Patient presents with  . Follow-up    VS Ht 5' 10.5" (1.791 m)  Wt 233 lb (105.688 kg)  BMI 32.96 kg/m2  Weights Current Weight  03/13/12 233 lb (105.688 kg)  11/02/11 234 lb 12 oz (106.482 kg)  10/03/11 235 lb 4 oz (106.709 kg)    Blood Pressure  BP Readings from Last 3 Encounters:  11/02/11 140/76  10/03/11 160/78  05/25/11 140/70     Admit date:  (Not on file) Last encounter with RMR:  10/03/2011   Allergy Allergies  Allergen Reactions  . Diltiazem Hcl     Current Outpatient Prescriptions  Medication Sig Dispense Refill  . aspirin 81 MG tablet Take 81 mg by mouth daily.        . chlorpheniramine-HYDROcodone (TUSSIONEX) 10-8 MG/5ML LQCR Take 5 mLs by mouth at bedtime as needed. Sedation precautions  140 mL  0  . chlorthalidone (HYGROTON) 25 MG tablet TAKE 1/2 TABLET BY MOUTH EVERY DAY  45 tablet  2  . cimetidine (TAGAMET) 400 MG tablet Take 400 mg by mouth as directed.        . doxycycline (DORYX) 100 MG EC tablet Take 100 mg by mouth daily.      . metoprolol succinate (TOPROL-XL) 100 MG 24 hr tablet Take 1 tablet (100 mg total) by mouth daily. Take with or immediately following a meal.  90 tablet  3  . nitroGLYCERIN (NITROSTAT) 0.4 MG SL tablet Place 0.4 mg under the tongue every 5 (five) minutes as needed.        . Omega-3 Fatty Acids (FISH OIL PO) Take by mouth.        . pravastatin (PRAVACHOL) 20 MG tablet Take 20 mg by mouth daily.      . tadalafil (CIALIS) 20 MG tablet Take 1 tablet (20 mg total) by mouth daily as needed.  10 tablet  6  . vitamin B-12 (CYANOCOBALAMIN) 1000 MCG tablet Take 1,000 mcg by mouth daily.          Discontinued Meds:   There are no discontinued medications.  Patient Active Problem List  Diagnosis  . HYPERLIPIDEMIA  . Overweight  . ERECTILE DYSFUNCTION  .  PERIPHERAL VASCULAR DISEASE  . Gastroesophageal reflux disease  . OSTEOARTHRITIS  . Arteriosclerotic cardiovascular disease (ASCVD)  . Cerebrovascular disease  . Adenomatous colon polyp  . Vitamin B12 deficiency  . Routine general medical examination at a health care facility  . Osteoarthritis, shoulder  . Viral URI with cough    LABS No visits with results within 3 Month(s) from this visit. Latest known visit with results is:  Office Visit on 05/15/2011  Component Date Value  . Vitamin B-12 05/15/2011 697   . Cholesterol 05/15/2011 104   . Triglycerides 05/15/2011 85.0   . HDL 05/15/2011 45.70   . VLDL 05/15/2011 17.0   . LDL Cholesterol 05/15/2011 41   . Total CHOL/HDL Ratio 05/15/2011 2   . PSA 05/15/2011 0.32   . Sodium 05/15/2011 139   . Potassium 05/15/2011 4.0   . Chloride 05/15/2011 103   . CO2 05/15/2011 29   . Glucose, Bld 05/15/2011 89   . BUN 05/15/2011 16   . Creatinine, Ser 05/15/2011 0.9   .  Calcium 05/15/2011 9.0   . GFR 05/15/2011 88.29   . WBC 05/15/2011 9.7   . RBC 05/15/2011 3.61*  . Hemoglobin 05/15/2011 13.6   . HCT 05/15/2011 40.4   . MCV 05/15/2011 111.8 Repeated and verified X2.*  . MCHC 05/15/2011 33.6   . RDW 05/15/2011 14.3   . Platelets 05/15/2011 269.0   . Neutrophils Relative 05/15/2011 65.0   . Lymphocytes Relative 05/15/2011 24.2   . Monocytes Relative 05/15/2011 9.0   . Eosinophils Relative 05/15/2011 1.3   . Basophils Relative 05/15/2011 0.5   . Neutro Abs 05/15/2011 6.3   . Lymphs Abs 05/15/2011 2.3   . Monocytes Absolute 05/15/2011 0.9   . Eosinophils Absolute 05/15/2011 0.1   . Basophils Absolute 05/15/2011 0.0   . TSH 05/15/2011 0.79   . Total Bilirubin 05/15/2011 0.6   . Bilirubin, Direct 05/15/2011 0.1   . Alkaline Phosphatase 05/15/2011 38*  . AST 05/15/2011 27   . ALT 05/15/2011 34   . Total Protein 05/15/2011 7.2   . Albumin 05/15/2011 4.2      Results for this Opt Visit:     Results for orders placed in  visit on 05/15/11  VITAMIN B12      Component Value Range   Vitamin B-12 697  211 - 911 pg/mL  LIPID PANEL      Component Value Range   Cholesterol 104  0 - 200 mg/dL   Triglycerides 34.7  0.0 - 149.0 mg/dL   HDL 42.59  >56.38 mg/dL   VLDL 75.6  0.0 - 43.3 mg/dL   LDL Cholesterol 41  0 - 99 mg/dL   Total CHOL/HDL Ratio 2    PSA      Component Value Range   PSA 0.32  0.10 - 4.00 ng/mL  BASIC METABOLIC PANEL      Component Value Range   Sodium 139  135 - 145 mEq/L   Potassium 4.0  3.5 - 5.1 mEq/L   Chloride 103  96 - 112 mEq/L   CO2 29  19 - 32 mEq/L   Glucose, Bld 89  70 - 99 mg/dL   BUN 16  6 - 23 mg/dL   Creatinine, Ser 0.9  0.4 - 1.5 mg/dL   Calcium 9.0  8.4 - 29.5 mg/dL   GFR 18.84  >16.60 mL/min  CBC WITH DIFFERENTIAL      Component Value Range   WBC 9.7  4.5 - 10.5 K/uL   RBC 3.61 (*) 4.22 - 5.81 Mil/uL   Hemoglobin 13.6  13.0 - 17.0 g/dL   HCT 63.0  16.0 - 10.9 %   MCV 111.8 Repeated and verified X2. (*) 78.0 - 100.0 fl   MCHC 33.6  30.0 - 36.0 g/dL   RDW 32.3  55.7 - 32.2 %   Platelets 269.0  150.0 - 400.0 K/uL   Neutrophils Relative 65.0  43.0 - 77.0 %   Lymphocytes Relative 24.2  12.0 - 46.0 %   Monocytes Relative 9.0  3.0 - 12.0 %   Eosinophils Relative 1.3  0.0 - 5.0 %   Basophils Relative 0.5  0.0 - 3.0 %   Neutro Abs 6.3  1.4 - 7.7 K/uL   Lymphs Abs 2.3  0.7 - 4.0 K/uL   Monocytes Absolute 0.9  0.1 - 1.0 K/uL   Eosinophils Absolute 0.1  0.0 - 0.7 K/uL   Basophils Absolute 0.0  0.0 - 0.1 K/uL  TSH      Component Value Range   TSH  0.79  0.35 - 5.50 uIU/mL  HEPATIC FUNCTION PANEL      Component Value Range   Total Bilirubin 0.6  0.3 - 1.2 mg/dL   Bilirubin, Direct 0.1  0.0 - 0.3 mg/dL   Alkaline Phosphatase 38 (*) 39 - 117 U/L   AST 27  0 - 37 U/L   ALT 34  0 - 53 U/L   Total Protein 7.2  6.0 - 8.3 g/dL   Albumin 4.2  3.5 - 5.2 g/dL    EKG Orders placed in visit on 03/08/09  . CONVERTED CEMR EKG     Prior Assessment and Plan Problem List as  of 03/13/2012            Cardiology Problems   HYPERLIPIDEMIA   Last Assessment & Plan Note   05/15/2011 Office Visit Signed 05/15/2011 10:57 AM by Karie Schwalbe, MD     Lab Results  Component Value Date   LDLCALC 38 05/06/2010   Good control Due for recheck    PERIPHERAL VASCULAR DISEASE   Last Assessment & Plan Note   03/09/2011 Office Visit Addendum 03/11/2011 10:54 PM by Kathlen Brunswick, MD    Patient denies claudication at present.  No further evaluation of peripheral vascular disease is warranted.  Palpable distal pulses suggest reasonably good lower extremity circulation.    Arteriosclerotic cardiovascular disease (ASCVD)   Last Assessment & Plan Note   05/15/2011 Office Visit Signed 05/15/2011 10:56 AM by Karie Schwalbe, MD    Seems to be quiet Dr Dietrich Pates follows    Cerebrovascular disease   Last Assessment & Plan Note   03/09/2011 Office Visit Signed 03/09/2011  4:05 PM by Kathlen Brunswick, MD    Asymptomatic cerebrovascular disease of moderate severity.  A repeat carotid ultrasound will be obtained within the next 12 months.      Other   Overweight   Last Assessment & Plan Note   03/09/2011 Office Visit Signed 03/09/2011  4:07 PM by Kathlen Brunswick, MD    Patient was congratulated on a 2 pound weight loss and encouraged to restrict calories and exercise to achieve further weight reduction.    ERECTILE DYSFUNCTION   Gastroesophageal reflux disease   OSTEOARTHRITIS   Last Assessment & Plan Note   05/15/2011 Office Visit Signed 05/15/2011 10:57 AM by Karie Schwalbe, MD    Will schedule shoulder injections    Adenomatous colon polyp   Vitamin B12 deficiency   Last Assessment & Plan Note   05/15/2011 Office Visit Signed 05/15/2011 10:57 AM by Karie Schwalbe, MD    Sig macrocytosis last year Will recheck on oral therapy    Routine general medical examination at a health care facility   Last Assessment & Plan Note   05/15/2011 Office Visit Signed 05/15/2011 10:56 AM by  Karie Schwalbe, MD    Doing okay but not in very good shape Discussed fitness He doesn't want zostavax Will check PSA    Osteoarthritis, shoulder   Last Assessment & Plan Note   05/25/2011 Office Visit Signed 05/25/2011  4:40 PM by Karie Schwalbe, MD    Procedure  Sterile prep with posterior approach to left shoulder Local anaesthesia with 1.5cc plain 2% lidocaine Some spurs encountered but then shoulder entered cleanly 40mg  kenalog and 8cc 2% lidocaine Instilled Tolerated well Home care discussed    Viral URI with cough   Last Assessment & Plan Note   11/02/2011 Office Visit Signed 11/02/2011  9:10 AM  by Eustaquio Boyden, MD    Anticipate viral uri with cough - treat supportively as per instructions. Discussed red flags to return or notify us.        Imaging: No results found.   FRS Calculation: Score not calculated. Missing: Total Cholesterol

## 2012-03-13 NOTE — Patient Instructions (Addendum)
Your physician recommends that you schedule a follow-up appointment in: 1 year follow up  

## 2012-03-13 NOTE — Assessment & Plan Note (Signed)
Patient notes burning discomfort in the soles of his feet with exertion and relief with rest.  This certainly does not sound like typical claudication.  He has a history of SFA occlusion, but distal pulses are relatively good.  I suggested symptomatic treatment.

## 2012-03-19 ENCOUNTER — Other Ambulatory Visit: Payer: Self-pay | Admitting: Cardiology

## 2012-03-19 ENCOUNTER — Other Ambulatory Visit: Payer: Self-pay | Admitting: *Deleted

## 2012-03-19 ENCOUNTER — Ambulatory Visit (INDEPENDENT_AMBULATORY_CARE_PROVIDER_SITE_OTHER): Payer: Medicare Other | Admitting: *Deleted

## 2012-03-19 DIAGNOSIS — Z23 Encounter for immunization: Secondary | ICD-10-CM

## 2012-03-19 MED ORDER — CHLORTHALIDONE 25 MG PO TABS: 12.5000 mg | ORAL_TABLET | Freq: Every day | ORAL | Status: DC

## 2012-03-19 MED ORDER — PRAVASTATIN SODIUM 20 MG PO TABS: 20.0000 mg | ORAL_TABLET | Freq: Every day | ORAL | Status: DC

## 2012-03-19 NOTE — Telephone Encounter (Signed)
Pharmacy states they have faxed this over last week.tmj

## 2012-05-14 ENCOUNTER — Encounter: Payer: BC Managed Care – PPO | Admitting: Internal Medicine

## 2012-05-31 ENCOUNTER — Encounter: Payer: BC Managed Care – PPO | Admitting: Internal Medicine

## 2012-07-26 ENCOUNTER — Encounter: Payer: Self-pay | Admitting: Family Medicine

## 2012-07-26 ENCOUNTER — Ambulatory Visit (INDEPENDENT_AMBULATORY_CARE_PROVIDER_SITE_OTHER): Payer: Medicare PPO | Admitting: Family Medicine

## 2012-07-26 VITALS — BP 138/78 | HR 78 | Temp 98.8°F | Wt 234.0 lb

## 2012-07-26 DIAGNOSIS — J069 Acute upper respiratory infection, unspecified: Secondary | ICD-10-CM | POA: Insufficient documentation

## 2012-07-26 MED ORDER — HYDROCOD POLST-CHLORPHEN POLST 10-8 MG/5ML PO LQCR: 5.0000 mL | Freq: Every evening | ORAL | Status: DC | PRN

## 2012-07-26 MED ORDER — AZITHROMYCIN 250 MG PO TABS: ORAL_TABLET | ORAL | Status: DC

## 2012-07-26 NOTE — Progress Notes (Signed)
Subjective:    Patient ID: Aaron Sexton, male    DOB: 1945-12-21, 67 y.o.   MRN: [id number]  HPI CC: cough  1d h/o cough - keeping him up at night - with significant PNdrainage.  Productive of clear.  Head congestion.  Mild ST last night.  Hasn't tried anything for this in past.  No fevers/chills, abd pain, ear or tooth pain, HA or rash.  No smokers. No sick contacts. No h/o asthma, COPD, allergic rhinitis.  comorbidities include CAD and HLD.  On doxy daily for rosacea.  Past Medical History  Diagnosis Date  . Diverticulosis 2012    found on colonoscopy  . Colon polyps 3/12    single 2mm polyp--tubular adenoma  . GERD (gastroesophageal reflux disease)   . Arteriosclerotic cardiovascular disease (ASCVD) 1993    Critical RCA disease in 1993 treated with PTCA  . Hyperlipidemia   . Erectile dysfunction   . Osteoarthritis     knees/hands-Dr Sherlean Foot  . Cerebrovascular disease     Moderate ASVD without focal stenosis in 01/2007  . Tobacco abuse, in remission     30-40 pack years discontinued in 1993  . Overweight   . Rosacea   . Hematochezia 05/24/2010  . Peripheral vascular disease 03/11/2010    Moderate SFA stenosis; history of claudication  . Vitamin B12 deficiency      Review of Systems Per HPI    Objective:   Physical Exam  Nursing note and vitals reviewed. Constitutional: He appears well-developed and well-nourished. No distress.  HENT:  Head: Normocephalic and atraumatic.  Right Ear: Hearing, tympanic membrane, external ear and ear canal normal.  Left Ear: Hearing, tympanic membrane, external ear and ear canal normal.  Nose: Nose normal. No mucosal edema or rhinorrhea. Right sinus exhibits no maxillary sinus tenderness and no frontal sinus tenderness. Left sinus exhibits no maxillary sinus tenderness and no frontal sinus tenderness.  Mouth/Throat: Uvula is midline and mucous membranes are normal. Posterior oropharyngeal erythema present. No oropharyngeal  exudate, posterior oropharyngeal edema or tonsillar abscesses.  Nasal congestion  Eyes: Conjunctivae and EOM are normal. Pupils are equal, round, and reactive to light. No scleral icterus.  Neck: Normal range of motion. Neck supple. No thyromegaly present.  Cardiovascular: Normal rate, regular rhythm, normal heart sounds and intact distal pulses.   No murmur heard. Pulmonary/Chest: Effort normal and breath sounds normal. No respiratory distress. He has no wheezes. He has no rales.  Harsh cough present  Lymphadenopathy:    He has no cervical adenopathy.  Skin: Skin is warm and dry. No rash noted.       Assessment & Plan:

## 2012-07-26 NOTE — Assessment & Plan Note (Signed)
Given short duration, anticipate viral cough. Supportive care as per instructions, tussionex for cough at night. If worsening or developing fever or worsening cough, provided with zpack script as WASP. Pt agrees with plan.

## 2012-07-26 NOTE — Patient Instructions (Signed)
Sounds like you have a viral upper respiratory infection with cough. Antibiotics are not needed for this.  Viral infections usually take 7-10 days to resolve.  The cough can last several weeks to go away. Use medication as prescribed: tussionex at night time for cough. Push fluids and plenty of rest. If cough worsening or fever, fill zpack provided today. Call clinic with questions.  Good to see you today.

## 2012-07-29 ENCOUNTER — Ambulatory Visit: Payer: Medicare Other | Admitting: Family Medicine

## 2012-08-16 ENCOUNTER — Ambulatory Visit (INDEPENDENT_AMBULATORY_CARE_PROVIDER_SITE_OTHER): Payer: Medicare PPO | Admitting: Internal Medicine

## 2012-08-16 ENCOUNTER — Encounter: Payer: Self-pay | Admitting: Internal Medicine

## 2012-08-16 VITALS — BP 140/70 | HR 73 | Temp 97.7°F | Ht 70.0 in | Wt 232.0 lb

## 2012-08-16 DIAGNOSIS — I2510 Atherosclerotic heart disease of native coronary artery without angina pectoris: Secondary | ICD-10-CM

## 2012-08-16 DIAGNOSIS — E669 Obesity, unspecified: Secondary | ICD-10-CM

## 2012-08-16 DIAGNOSIS — I7090 Unspecified atherosclerosis: Secondary | ICD-10-CM

## 2012-08-16 DIAGNOSIS — Z1331 Encounter for screening for depression: Secondary | ICD-10-CM

## 2012-08-16 DIAGNOSIS — Z7902 Long term (current) use of antithrombotics/antiplatelets: Secondary | ICD-10-CM | POA: Insufficient documentation

## 2012-08-16 DIAGNOSIS — K219 Gastro-esophageal reflux disease without esophagitis: Secondary | ICD-10-CM

## 2012-08-16 DIAGNOSIS — E785 Hyperlipidemia, unspecified: Secondary | ICD-10-CM

## 2012-08-16 DIAGNOSIS — F528 Other sexual dysfunction not due to a substance or known physiological condition: Secondary | ICD-10-CM

## 2012-08-16 DIAGNOSIS — Z0000 Encounter for general adult medical examination without abnormal findings: Secondary | ICD-10-CM

## 2012-08-16 LAB — BASIC METABOLIC PANEL
BUN: 25 mg/dL — ABNORMAL HIGH (ref 6–23)
Chloride: 101 mEq/L (ref 96–112)
Creatinine, Ser: 1.1 mg/dL (ref 0.4–1.5)

## 2012-08-16 LAB — LIPID PANEL
Cholesterol: 84 mg/dL (ref 0–200)
Triglycerides: 71 mg/dL (ref 0.0–149.0)

## 2012-08-16 LAB — VITAMIN B12: Vitamin B-12: 878 pg/mL (ref 211–911)

## 2012-08-16 LAB — CBC WITH DIFFERENTIAL/PLATELET
Eosinophils Absolute: 0.2 10*3/uL (ref 0.0–0.7)
Eosinophils Relative: 2.1 % (ref 0.0–5.0)
MCV: 111.9 fl — ABNORMAL HIGH (ref 78.0–100.0)
Monocytes Absolute: 1 10*3/uL (ref 0.1–1.0)
Neutrophils Relative %: 57.3 % (ref 43.0–77.0)
Platelets: 280 10*3/uL (ref 150.0–400.0)
WBC: 8.6 10*3/uL (ref 4.5–10.5)

## 2012-08-16 LAB — TSH: TSH: 0.51 u[IU]/mL (ref 0.35–5.50)

## 2012-08-16 LAB — HEPATIC FUNCTION PANEL: Total Bilirubin: 0.3 mg/dL (ref 0.3–1.2)

## 2012-08-16 NOTE — Assessment & Plan Note (Signed)
Healthy but lacking in fitness Discussed exercise Defer PSA to next year DASH diet info given

## 2012-08-16 NOTE — Assessment & Plan Note (Signed)
Will give DASH diet info

## 2012-08-16 NOTE — Progress Notes (Signed)
Subjective:    Patient ID: Aaron Sexton, male    DOB: 02/13/1946, 67 y.o.   MRN: [id number]  HPI Here for physical Doing well Did retire and that has gone well Stays busy with yard work, etc---but does no exercise. Discussed this  UTD on colon and imms PSA fine last year  No heart problems Has not needed NTG in all his cardiac years---does have them just in case Discussed taking the NTG off list---any angina should be considered unstable (and I am not comfortable with him having this with his cialis) Current Outpatient Prescriptions on File Prior to Visit  Medication Sig Dispense Refill  . aspirin 81 MG tablet Take 81 mg by mouth daily.        . chlorthalidone (HYGROTON) 25 MG tablet Take 0.5 tablets (12.5 mg total) by mouth daily.  45 tablet  3  . cimetidine (TAGAMET) 400 MG tablet Take 400 mg by mouth 2 (two) times daily as needed.       . metoprolol succinate (TOPROL-XL) 100 MG 24 hr tablet Take 1 tablet (100 mg total) by mouth daily. Take with or immediately following a meal.  90 tablet  3  . Omega-3 Fatty Acids (FISH OIL PO) Take by mouth.        . pravastatin (PRAVACHOL) 20 MG tablet Take 1 tablet (20 mg total) by mouth daily.  90 tablet  3  . tadalafil (CIALIS) 20 MG tablet Take 1 tablet (20 mg total) by mouth daily as needed.  10 tablet  6  . vitamin B-12 (CYANOCOBALAMIN) 1000 MCG tablet Take 1,000 mcg by mouth daily.         No current facility-administered medications on file prior to visit.    Allergies  Allergen Reactions  . Diltiazem Hcl     Past Medical History  Diagnosis Date  . Diverticulosis 2012    found on colonoscopy  . Colon polyps 3/12    single 2mm polyp--tubular adenoma  . GERD (gastroesophageal reflux disease)   . Arteriosclerotic cardiovascular disease (ASCVD) 1993    Critical RCA disease in 1993 treated with PTCA  . Hyperlipidemia   . Erectile dysfunction   . Osteoarthritis     knees/hands-Dr Sherlean Foot  . Cerebrovascular disease     Moderate  ASVD without focal stenosis in 01/2007  . Tobacco abuse, in remission     30-40 pack years discontinued in 1993  . Overweight   . Rosacea   . Hematochezia 05/24/2010  . Peripheral vascular disease 03/11/2010    Moderate SFA stenosis; history of claudication  . Vitamin B12 deficiency     Past Surgical History  Procedure Laterality Date  . Appendectomy    . Colonoscopy w/ polypectomy  2012    Family History  Problem Relation Age of Onset  . Hypertension Father     And siblings  . Heart disease Father     And second-degree relatives  . Transient ischemic attack Father   . Lung cancer Mother     History   Social History  . Marital Status: Married    Spouse Name: N/A    Number of Children: N/A  . Years of Education: N/A   Occupational History  . 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 Smoking status: Former Smoker -- 1.00 packs/day for 30 years  . Smokeless tobacco: Never Used     Comment: Quit in 1993  . Alcohol Use:  No  . Drug Use: No  . Sexually Active: Not on file   Other Topics Concern  . Not on file   Social History Narrative   No living will   Requests wife as health care POA   Would accept resuscitation but no artificial ventilation.   No tube feeds if cognitively unaware   Review of Systems  Constitutional: Negative for fatigue and unexpected weight change.       Wears seat belt  HENT: Negative for hearing loss, congestion, rhinorrhea, dental problem and tinnitus.        Regular with dentist  Eyes: Negative for visual disturbance.       No diplopia or unilateral vision loss  Respiratory: Negative for cough, chest tightness and shortness of breath.   Cardiovascular: Negative for chest pain, palpitations and leg swelling.  Gastrointestinal: Negative for nausea, vomiting, abdominal pain, constipation and blood in stool.       Rare heartburn-- uses tagament occ  Endocrine: Negative for cold  intolerance and heat intolerance.  Genitourinary: Negative for urgency, frequency and difficulty urinating.       Satisfied with the cialis  Musculoskeletal: Positive for arthralgias. Negative for back pain and joint swelling.       Hand arthritis--uses tylenol prn  Skin: Negative for rash.       Some hyperpigmented spots---nothing suspicious  Allergic/Immunologic: Negative for environmental allergies and immunocompromised state.  Neurological: Negative for dizziness, syncope, weakness, light-headedness, numbness and headaches.       Still has some burning on plantar feet  Psychiatric/Behavioral: Negative for sleep disturbance and dysphoric mood. The patient is not nervous/anxious.        Objective:   Physical Exam  Constitutional: He is oriented to person, place, and time. He appears well-developed and well-nourished. No distress.  HENT:  Head: Normocephalic and atraumatic.  Right Ear: External ear normal.  Left Ear: External ear normal.  Mouth/Throat: Oropharynx is clear and moist. No oropharyngeal exudate.  Eyes: Conjunctivae and EOM are normal. Pupils are equal, round, and reactive to light.  Neck: Normal range of motion. Neck supple. No thyromegaly present.  Cardiovascular: Normal rate, regular rhythm and normal heart sounds.  Exam reveals no gallop.   No murmur heard. ?faint distal pulses  Pulmonary/Chest: Effort normal and breath sounds normal. No respiratory distress. He has no wheezes. He has no rales.  Abdominal: Soft. There is no tenderness.  Musculoskeletal: He exhibits no edema and no tenderness.  Lymphadenopathy:    He has no cervical adenopathy.  Neurological: He is alert and oriented to person, place, and time.  Skin: No rash noted. No erythema.  Psychiatric: He has a normal mood and affect. His behavior is normal.          Assessment & Plan:

## 2012-08-16 NOTE — Assessment & Plan Note (Signed)
Rarely needs the tagamet

## 2012-08-16 NOTE — Assessment & Plan Note (Signed)
No problems with med Will recheck labs

## 2012-08-16 NOTE — Assessment & Plan Note (Signed)
Satisfied with the cialis 

## 2012-08-16 NOTE — Assessment & Plan Note (Signed)
Has been quiet No angina Will stop the NTG--- call 911 if he has symptoms (is on cialis)

## 2012-08-16 NOTE — Patient Instructions (Signed)

## 2012-08-20 ENCOUNTER — Encounter: Payer: Self-pay | Admitting: *Deleted

## 2012-09-12 ENCOUNTER — Encounter: Payer: Self-pay | Admitting: Internal Medicine

## 2012-09-12 ENCOUNTER — Ambulatory Visit (INDEPENDENT_AMBULATORY_CARE_PROVIDER_SITE_OTHER): Payer: Medicare PPO | Admitting: Internal Medicine

## 2012-09-12 VITALS — BP 140/80 | HR 80 | Temp 97.9°F | Wt 230.0 lb

## 2012-09-12 DIAGNOSIS — J069 Acute upper respiratory infection, unspecified: Secondary | ICD-10-CM | POA: Insufficient documentation

## 2012-09-12 NOTE — Assessment & Plan Note (Signed)
Discussed apparent viral etiology Supportive Rx If worsens next week, would consider empiric antibiotic

## 2012-09-12 NOTE — Progress Notes (Signed)
Subjective:    Patient ID: Aaron Sexton, male    DOB: 09-14-1945, 67 y.o.   MRN: [id number]  HPI Has been sick again Woke up yesterday morning---- stopped up in head and ears Eyes are watering Nasal congestion Tried tylenol  No fever Some cough--clear sputum Still has some tussinex--helps him sleep at night No SOB Slight scratchy throat from drainage  Current Outpatient Prescriptions on File Prior to Visit  Medication Sig Dispense Refill  . aspirin 81 MG tablet Take 81 mg by mouth daily.        . chlorthalidone (HYGROTON) 25 MG tablet Take 0.5 tablets (12.5 mg total) by mouth daily.  45 tablet  3  . cimetidine (TAGAMET) 400 MG tablet Take 400 mg by mouth 2 (two) times daily as needed.       . metoprolol succinate (TOPROL-XL) 100 MG 24 hr tablet Take 1 tablet (100 mg total) by mouth daily. Take with or immediately following a meal.  90 tablet  3  . Omega-3 Fatty Acids (FISH OIL PO) Take by mouth.        . pravastatin (PRAVACHOL) 20 MG tablet Take 1 tablet (20 mg total) by mouth daily.  90 tablet  3  . tadalafil (CIALIS) 20 MG tablet Take 1 tablet (20 mg total) by mouth daily as needed.  10 tablet  6  . vitamin B-12 (CYANOCOBALAMIN) 1000 MCG tablet Take 1,000 mcg by mouth daily.         No current facility-administered medications on file prior to visit.    Allergies  Allergen Reactions  . Diltiazem Hcl     Past Medical History  Diagnosis Date  . Diverticulosis 2012    found on colonoscopy  . Colon polyps 3/12    single 2mm polyp--tubular adenoma  . GERD (gastroesophageal reflux disease)   . Arteriosclerotic cardiovascular disease (ASCVD) 1993    Critical RCA disease in 1993 treated with PTCA  . Hyperlipidemia   . Erectile dysfunction   . Osteoarthritis     knees/hands-Dr Sherlean Foot  . Cerebrovascular disease     Moderate ASVD without focal stenosis in 01/2007  . Tobacco abuse, in remission     30-40 pack years discontinued in 1993  . Overweight   . Rosacea   .  Hematochezia 05/24/2010  . Peripheral vascular disease 03/11/2010    Moderate SFA stenosis; history of claudication  . Vitamin B12 deficiency     Past Surgical History  Procedure Laterality Date  . Appendectomy    . Colonoscopy w/ polypectomy  2012    Family History  Problem Relation Age of Onset  . Hypertension Father     And siblings  . Heart disease Father     And second-degree relatives  . Transient ischemic attack Father   . Lung cancer Mother     History   Social History  . Marital Status: Married    Spouse Name: N/A    Number of Children: N/A  . Years of Education: N/A   Occupational History  . 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 Smoking status: Former Smoker -- 1.00 packs/day for 30 years  . Smokeless tobacco: Never Used     Comment: Quit in 1993  . Alcohol Use: No  . Drug Use: No  . Sexually Active: Not on file   Other Topics Concern  . Not on file   Social History Narrative   No  living will   Requests wife as health care POA   Would accept resuscitation but no artificial ventilation.   No tube feeds if cognitively unaware    Review of Systems No allergy symptoms No rash  No vomiting or diarrhea    Objective:   Physical Exam  Constitutional: He appears well-developed and well-nourished. No distress.  HENT:  Mouth/Throat: Oropharynx is clear and moist. No oropharyngeal exudate.  No sinus tenderness TMs normal Mild nasal congestion   Neck: Normal range of motion. Neck supple. No thyromegaly present.  Pulmonary/Chest: Effort normal and breath sounds normal. No respiratory distress. He has no wheezes. He has no rales.  Lymphadenopathy:    He has no cervical adenopathy.          Assessment & Plan:

## 2012-12-11 ENCOUNTER — Other Ambulatory Visit: Payer: Self-pay | Admitting: Cardiology

## 2012-12-11 NOTE — Telephone Encounter (Signed)
Medication sent via escribe for Metoprolol.  

## 2012-12-13 ENCOUNTER — Other Ambulatory Visit: Payer: Self-pay | Admitting: *Deleted

## 2012-12-13 MED ORDER — METOPROLOL SUCCINATE ER 100 MG PO TB24: ORAL_TABLET | ORAL | Status: DC

## 2013-01-29 ENCOUNTER — Encounter: Payer: Self-pay | Admitting: Cardiology

## 2013-02-12 ENCOUNTER — Ambulatory Visit (INDEPENDENT_AMBULATORY_CARE_PROVIDER_SITE_OTHER): Payer: Medicare PPO

## 2013-02-12 DIAGNOSIS — Z23 Encounter for immunization: Secondary | ICD-10-CM

## 2013-03-13 ENCOUNTER — Other Ambulatory Visit: Payer: Self-pay | Admitting: Cardiology

## 2013-03-14 ENCOUNTER — Ambulatory Visit (INDEPENDENT_AMBULATORY_CARE_PROVIDER_SITE_OTHER): Payer: Medicare PPO | Admitting: Cardiology

## 2013-03-14 ENCOUNTER — Encounter: Payer: Self-pay | Admitting: Cardiology

## 2013-03-14 VITALS — BP 130/62 | HR 70 | Ht 70.0 in | Wt 230.0 lb

## 2013-03-14 DIAGNOSIS — E785 Hyperlipidemia, unspecified: Secondary | ICD-10-CM

## 2013-03-14 DIAGNOSIS — I2510 Atherosclerotic heart disease of native coronary artery without angina pectoris: Secondary | ICD-10-CM

## 2013-03-14 DIAGNOSIS — I10 Essential (primary) hypertension: Secondary | ICD-10-CM

## 2013-03-14 DIAGNOSIS — I739 Peripheral vascular disease, unspecified: Secondary | ICD-10-CM

## 2013-03-14 MED ORDER — CHLORTHALIDONE 25 MG PO TABS: 12.5000 mg | ORAL_TABLET | Freq: Every day | ORAL | Status: DC

## 2013-03-14 MED ORDER — CILOSTAZOL 50 MG PO TABS: 50.0000 mg | ORAL_TABLET | Freq: Two times a day (BID) | ORAL | Status: DC

## 2013-03-14 MED ORDER — NITROGLYCERIN 0.4 MG SL SUBL: 0.4000 mg | SUBLINGUAL_TABLET | SUBLINGUAL | Status: DC | PRN

## 2013-03-14 MED ORDER — METOPROLOL SUCCINATE ER 100 MG PO TB24: ORAL_TABLET | ORAL | Status: DC

## 2013-03-14 MED ORDER — PRAVASTATIN SODIUM 20 MG PO TABS: 20.0000 mg | ORAL_TABLET | Freq: Every day | ORAL | Status: DC

## 2013-03-14 NOTE — Progress Notes (Signed)
Clinical Summary Aaron Sexton is a 67 y.o.male former patient of Dr Dietrich Pates, presents for follow up of the following medical problems  1. CAD - prior PTCA to RCA in 1993 - denies any recent chest pain, no SOB or DOE. Fairly sedentary lifestyle, tolerates daily activities well. No orthopnea, no pnd, no orthopnea - compliant with meds: ASA, Toprol, pravastatin  2. Carotid stenosis - moderate disease on most recent imaging - denies any neurological symptoms  3. Hyperlipidemia - compliant with statin, denies any side effects  4. Leg cramping - bilateral cramping pain in calves with walking at about 1 block. No pain at rest. No sores on feet.  Started several years ago. Stable symptoms over that time Prior ABIs in 2010 showed right ABI 0.56 Left 0.44  5. HTN - checks bp at home daily, typically 120s/70s - compliant with meds  Past Medical History  Diagnosis Date  . Diverticulosis 2012    found on colonoscopy  . Colon polyps 3/12    single 2mm polyp--tubular adenoma  . GERD (gastroesophageal reflux disease)   . Arteriosclerotic cardiovascular disease (ASCVD) 1993    Critical RCA disease in 1993 treated with PTCA  . Hyperlipidemia   . Erectile dysfunction   . Osteoarthritis     knees/hands-Dr Sherlean Foot  . Cerebrovascular disease     Moderate ASVD without focal stenosis in 01/2007  . Tobacco abuse, in remission     30-40 pack years discontinued in 1993  . Overweight(278.02)   . Rosacea   . Hematochezia 05/24/2010  . Peripheral vascular disease 03/11/2010    Moderate SFA stenosis; history of claudication  . Vitamin B12 deficiency      Allergies  Allergen Reactions  . Diltiazem Hcl      Current Outpatient Prescriptions  Medication Sig Dispense Refill  . aspirin 81 MG tablet Take 81 mg by mouth daily.        . chlorthalidone (HYGROTON) 25 MG tablet Take 0.5 tablets (12.5 mg total) by mouth daily.  45 tablet  3  . chlorthalidone (HYGROTON) 25 MG tablet TAKE 1/2 TABLET  BY MOUTH EVERY DAY  45 tablet  2  . cimetidine (TAGAMET) 400 MG tablet Take 400 mg by mouth 2 (two) times daily as needed.       . metoprolol succinate (TOPROL-XL) 100 MG 24 hr tablet TAKE ONE TABLET BY MOUTH EVERY DAY  90 tablet  1  . Omega-3 Fatty Acids (FISH OIL PO) Take by mouth.        . pravastatin (PRAVACHOL) 20 MG tablet Take 1 tablet (20 mg total) by mouth daily.  90 tablet  3  . pravastatin (PRAVACHOL) 20 MG tablet TAKE ONE TABLET BY MOUTH EVERY DAY  90 tablet  2  . tadalafil (CIALIS) 20 MG tablet Take 1 tablet (20 mg total) by mouth daily as needed.  10 tablet  6  . vitamin B-12 (CYANOCOBALAMIN) 1000 MCG tablet Take 1,000 mcg by mouth daily.         No current facility-administered medications for this visit.     Past Surgical History  Procedure Laterality Date  . Appendectomy    . Colonoscopy w/ polypectomy  2012     Allergies  Allergen Reactions  . Diltiazem Hcl       Family History  Problem Relation Age of Onset  . Hypertension Father     And siblings  . Heart disease Father     And second-degree relatives  . Transient ischemic  attack Father   . Lung cancer Mother      Social History Aaron Sexton reports that he has quit smoking. He has never used smokeless tobacco. Aaron Sexton reports that he does not drink alcohol.   Review of Systems CONSTITUTIONAL: No weight loss, fever, chills, weakness or fatigue.  HEENT: Eyes: No visual loss, blurred vision, double vision or yellow sclerae.No hearing loss, sneezing, congestion, runny nose or sore throat.  SKIN: No rash or itching.  CARDIOVASCULAR: per HPI RESPIRATORY: per HPI GASTROINTESTINAL: No anorexia, nausea, vomiting or diarrhea. No abdominal pain or blood.  GENITOURINARY: No burning on urination, no polyuria NEUROLOGICAL: No headache, dizziness, syncope, paralysis, ataxia, numbness or tingling in the extremities. No change in bowel or bladder control.  MUSCULOSKELETAL: per HPI  LYMPHATICS: No enlarged  nodes. No history of splenectomy.  PSYCHIATRIC: No history of depression or anxiety.  ENDOCRINOLOGIC: No reports of sweating, cold or heat intolerance. No polyuria or polydipsia.  [name]   Physical Examination p 70 bp 130/62 Wt 230 lbs BMI 33 Gen: resting comfortably, no acute distress HEENT: no scleral icterus, pupils equal round and reactive, no palptable cervical adenopathy,  CV: RRR, no m/r/g, no JVD, no carotid brui Resp: Clear to auscultation bilaterally GI: abdomen is soft, non-tender, non-distended, normal bowel sounds, no hepatosplenomegaly MSK: extremities are warm, 1+ bilateral edema.  Skin: warm, no rash Neuro:  no focal deficits Psych: appropriate affect   Diagnostic Studies 08/2011 Carotid US:  IMPRESSION: No significant change in the bilateral carotid atherosclerosis.  Right ICA narrowing less than 50%.  Moderate left ICA narrowing, 50-69%.  Pertinent labs 08/2012: TC 84 TG 71 HDL 40 LDL 29 Na 135 BUN 25 Cr 1.1 Hgb 12.9 Hct 39.3 Plt 280 AST 23 ALT 31 TSH 0.51  03/14/13 Clinic EKG: sinus rhythm, normal axis, no ischemic changes Assessment and Plan  1. CAD - no current symptoms - continue current medications and risk factor modification  2. Carotid stenosis - no current symptoms, moderate by recent US - repeat in 1 year  3. Hyperlipidemia - at goal on current statin, continue current medication - low cholesterol levels, despite known cardiovascular disease would not convert to high dose statin at this time  4. Claudication - symptoms consistent with stable claudication, prior ABI showed significant bilateral disease - start cilostazol 50mg  bid, educated on walking training for his symptoms  5. HTN - at goal, continue current meds.  - consider adding ACE-I at follow up due to proven cardiovascular outcome benefits in patients with known cardiovascular disease at follow up     Antoine Poche, M.D., F.A.C.C.

## 2013-03-14 NOTE — Patient Instructions (Addendum)
Your physician recommends that you schedule a follow-up appointment in: 4 months  Your physician has recommended you make the following change in your medication:   1) START PLETAL 50MG  TWICE DAILY

## 2013-03-17 ENCOUNTER — Ambulatory Visit: Payer: Medicare PPO | Admitting: Cardiology

## 2013-07-24 ENCOUNTER — Encounter: Payer: Self-pay | Admitting: Cardiology

## 2013-07-30 ENCOUNTER — Ambulatory Visit: Payer: Medicare PPO | Admitting: Cardiology

## 2013-08-11 ENCOUNTER — Ambulatory Visit: Payer: Medicare PPO | Admitting: Cardiology

## 2013-08-19 ENCOUNTER — Ambulatory Visit (INDEPENDENT_AMBULATORY_CARE_PROVIDER_SITE_OTHER): Payer: Medicare PPO | Admitting: Internal Medicine

## 2013-08-19 ENCOUNTER — Encounter: Payer: Self-pay | Admitting: Internal Medicine

## 2013-08-19 VITALS — BP 122/60 | HR 79 | Temp 97.5°F | Ht 70.0 in | Wt 231.0 lb

## 2013-08-19 DIAGNOSIS — D649 Anemia, unspecified: Secondary | ICD-10-CM

## 2013-08-19 DIAGNOSIS — I2510 Atherosclerotic heart disease of native coronary artery without angina pectoris: Secondary | ICD-10-CM

## 2013-08-19 DIAGNOSIS — M1990 Unspecified osteoarthritis, unspecified site: Secondary | ICD-10-CM

## 2013-08-19 DIAGNOSIS — E785 Hyperlipidemia, unspecified: Secondary | ICD-10-CM

## 2013-08-19 DIAGNOSIS — I679 Cerebrovascular disease, unspecified: Secondary | ICD-10-CM

## 2013-08-19 DIAGNOSIS — D539 Nutritional anemia, unspecified: Secondary | ICD-10-CM | POA: Insufficient documentation

## 2013-08-19 DIAGNOSIS — E538 Deficiency of other specified B group vitamins: Secondary | ICD-10-CM

## 2013-08-19 DIAGNOSIS — I7090 Unspecified atherosclerosis: Secondary | ICD-10-CM

## 2013-08-19 DIAGNOSIS — Z125 Encounter for screening for malignant neoplasm of prostate: Secondary | ICD-10-CM

## 2013-08-19 DIAGNOSIS — Z0000 Encounter for general adult medical examination without abnormal findings: Secondary | ICD-10-CM

## 2013-08-19 DIAGNOSIS — Z23 Encounter for immunization: Secondary | ICD-10-CM

## 2013-08-19 DIAGNOSIS — I739 Peripheral vascular disease, unspecified: Secondary | ICD-10-CM

## 2013-08-19 LAB — CBC WITH DIFFERENTIAL/PLATELET
BASOS ABS: 0.1 10*3/uL (ref 0.0–0.1)
Basophils Relative: 0.8 % (ref 0.0–3.0)
EOS ABS: 0.2 10*3/uL (ref 0.0–0.7)
Eosinophils Relative: 1.9 % (ref 0.0–5.0)
HCT: 39.7 % (ref 39.0–52.0)
Hemoglobin: 12.9 g/dL — ABNORMAL LOW (ref 13.0–17.0)
LYMPHS PCT: 26.2 % (ref 12.0–46.0)
Lymphs Abs: 2.5 10*3/uL (ref 0.7–4.0)
MCHC: 32.5 g/dL (ref 30.0–36.0)
MONO ABS: 0.9 10*3/uL (ref 0.1–1.0)
Monocytes Relative: 9.5 % (ref 3.0–12.0)
NEUTROS PCT: 61.6 % (ref 43.0–77.0)
Neutro Abs: 5.9 10*3/uL (ref 1.4–7.7)
PLATELETS: 312 10*3/uL (ref 150.0–400.0)
RBC: 3.47 Mil/uL — ABNORMAL LOW (ref 4.22–5.81)
RDW: 14.8 % — AB (ref 11.5–14.6)
WBC: 9.6 10*3/uL (ref 4.5–10.5)

## 2013-08-19 LAB — COMPREHENSIVE METABOLIC PANEL
ALBUMIN: 4.1 g/dL (ref 3.5–5.2)
ALK PHOS: 35 U/L — AB (ref 39–117)
ALT: 28 U/L (ref 0–53)
AST: 20 U/L (ref 0–37)
BUN: 23 mg/dL (ref 6–23)
CALCIUM: 9.2 mg/dL (ref 8.4–10.5)
CHLORIDE: 100 meq/L (ref 96–112)
CO2: 27 mEq/L (ref 19–32)
Creatinine, Ser: 0.9 mg/dL (ref 0.4–1.5)
GFR: 91.16 mL/min (ref >60.00)
Glucose, Bld: 98 mg/dL (ref 70–99)
POTASSIUM: 3.9 meq/L (ref 3.5–5.1)
SODIUM: 135 meq/L (ref 135–145)
TOTAL PROTEIN: 7.4 g/dL (ref 6.0–8.3)
Total Bilirubin: 0.9 mg/dL (ref 0.3–1.2)

## 2013-08-19 LAB — VITAMIN B12: Vitamin B-12: 1354 pg/mL — ABNORMAL HIGH (ref 211–911)

## 2013-08-19 LAB — LIPID PANEL
CHOLESTEROL: 86 mg/dL (ref 0–200)
HDL: 49.7 mg/dL (ref >39.00)
LDL CALC: 27 mg/dL (ref 0–99)
Total CHOL/HDL Ratio: 2
Triglycerides: 45 mg/dL (ref 0.0–149.0)
VLDL: 9 mg/dL (ref 0.0–40.0)

## 2013-08-19 LAB — T4, FREE: Free T4: 0.84 ng/dL (ref 0.60–1.60)

## 2013-08-19 LAB — PSA: PSA: 0.3 ng/mL (ref 0.10–4.00)

## 2013-08-19 LAB — TSH: TSH: 0.43 u[IU]/mL (ref 0.35–5.50)

## 2013-08-19 MED ORDER — LOSARTAN POTASSIUM 25 MG PO TABS: 25.0000 mg | ORAL_TABLET | Freq: Every day | ORAL | Status: DC

## 2013-08-19 MED ORDER — ZOSTER VACCINE LIVE 19400 UNT/0.65ML ~~LOC~~ SOLR: 0.6500 mL | Freq: Once | SUBCUTANEOUS | Status: DC

## 2013-08-19 NOTE — Progress Notes (Signed)
Pre visit review using our clinic review tool, if applicable. No additional management support is needed unless otherwise documented below in the visit note. 

## 2013-08-19 NOTE — Progress Notes (Signed)
Subjective:    Patient ID: Aaron Sexton, male    DOB: [birth year], 68 y.o.   MRN: [id number]  HPI Here for physical Has had cardiology follow up recently--no changes Reviewed advanced directives Discussed exercise  Ongoing arthritis pain Knees, shoulders, etc Tylenol gives a little help  Heart has been fine No chest pain, SOB, etc No edema No palpitations Some leg pain when he walks Has burning on bottom of feet also  Current Outpatient Prescriptions on File Prior to Visit  Medication Sig Dispense Refill  . aspirin 81 MG tablet Take 81 mg by mouth daily.        . chlorthalidone (HYGROTON) 25 MG tablet Take 0.5 tablets (12.5 mg total) by mouth daily.  135 tablet  3  . cilostazol (PLETAL) 50 MG tablet Take 1 tablet (50 mg total) by mouth 2 (two) times daily.  180 tablet  1  . cimetidine (TAGAMET) 400 MG tablet Take 400 mg by mouth 2 (two) times daily as needed.       . metoprolol succinate (TOPROL-XL) 100 MG 24 hr tablet TAKE ONE TABLET BY MOUTH EVERY DAY  90 tablet  3  . nitroGLYCERIN (NITROSTAT) 0.4 MG SL tablet Place 1 tablet (0.4 mg total) under the tongue every 5 (five) minutes as needed for chest pain.  90 tablet  3  . Omega-3 Fatty Acids (FISH OIL PO) Take by mouth.        . pravastatin (PRAVACHOL) 20 MG tablet Take 1 tablet (20 mg total) by mouth daily.  90 tablet  3  . tadalafil (CIALIS) 20 MG tablet Take 1 tablet (20 mg total) by mouth daily as needed.  10 tablet  6  . vitamin B-12 (CYANOCOBALAMIN) 1000 MCG tablet Take 1,000 mcg by mouth daily.         No current facility-administered medications on file prior to visit.    Allergies  Allergen Reactions  . Diltiazem Hcl     Past Medical History  Diagnosis Date  . Diverticulosis 2012    found on colonoscopy  . Colon polyps 3/12    single 2mm polyp--tubular adenoma  . GERD (gastroesophageal reflux disease)   . Arteriosclerotic cardiovascular disease (ASCVD) 1993    Critical RCA disease in 1993 treated with  PTCA  . Hyperlipidemia   . Erectile dysfunction   . Osteoarthritis     knees/hands-Dr Sherlean Foot  . Cerebrovascular disease     Moderate ASVD without focal stenosis in 01/2007  . Tobacco abuse, in remission     30-40 pack years discontinued in 1993  . Overweight   . Rosacea   . Hematochezia 05/24/2010  . Peripheral vascular disease 03/11/2010    Moderate SFA stenosis; history of claudication  . Vitamin B12 deficiency     Past Surgical History  Procedure Laterality Date  . Appendectomy    . Colonoscopy w/ polypectomy  2012    Family History  Problem Relation Age of Onset  . Hypertension Father     And siblings  . Heart disease Father     And second-degree relatives  . Transient ischemic attack Father   . Lung cancer Mother     History   Social History  . Marital Status: Married    Spouse Name: N/A    Number of Children: N/A  . Years of Education: N/A   Occupational History  . 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  Smoking status: Former Smoker -- 1.00 packs/day for 30 years  . Smokeless tobacco: Never Used     Comment: Quit in 1993  . Alcohol Use: No  . Drug Use: No  . Sexual Activity: Not on file   Other Topics Concern  . Not on file   Social History Narrative   No living will   Requests wife as health care POA   Would accept resuscitation but no artificial ventilation.   No tube feeds if cognitively unaware   Review of Systems  Constitutional: Negative for fatigue and unexpected weight change.       Wears seat belt  HENT: Negative for dental problem, hearing loss and tinnitus.        Regular with dentist  Eyes: Negative for visual disturbance.       No diplopia or unilateral vision loss  Respiratory: Negative for chest tightness and shortness of breath.        Rare cough  Cardiovascular: Negative for chest pain, palpitations and leg swelling.  Gastrointestinal: Negative for nausea,  vomiting, abdominal pain, constipation and blood in stool.       No heartburn  Endocrine: Negative for cold intolerance and heat intolerance.  Genitourinary: Negative for urgency, frequency and difficulty urinating.       Hasn't used the cialis  Musculoskeletal: Positive for arthralgias. Negative for back pain and joint swelling.  Skin: Negative for rash.       No suspicious lesions  Allergic/Immunologic: Negative for environmental allergies and immunocompromised state.  Neurological: Positive for numbness. Negative for dizziness, syncope, weakness, light-headedness and headaches.  Hematological: Negative for adenopathy. Does not bruise/bleed easily.  Psychiatric/Behavioral: Negative for sleep disturbance and dysphoric mood. The patient is not nervous/anxious.        Objective:   Physical Exam  Constitutional: He is oriented to person, place, and time. He appears well-developed and well-nourished. No distress.  HENT:  Head: Normocephalic and atraumatic.  Right Ear: External ear normal.  Left Ear: External ear normal.  Mouth/Throat: Oropharynx is clear and moist. No oropharyngeal exudate.  Eyes: Conjunctivae and EOM are normal. Pupils are equal, round, and reactive to light.  Neck: Normal range of motion. Neck supple. No thyromegaly present.  Cardiovascular: Normal rate, regular rhythm and normal heart sounds.  Exam reveals no gallop.   No murmur heard. Very faint pedal pulses  Pulmonary/Chest: Effort normal and breath sounds normal. No respiratory distress. He has no wheezes. He has no rales.  Abdominal: Soft. He exhibits no distension. There is no tenderness. There is no rebound and no guarding.  Musculoskeletal: He exhibits no edema and no tenderness.  Lymphadenopathy:    He has no cervical adenopathy.  Neurological: He is alert and oriented to person, place, and time.  Skin: No rash noted. No erythema.  Psychiatric: He has a normal mood and affect. His behavior is normal.           Assessment & Plan:

## 2013-08-19 NOTE — Assessment & Plan Note (Signed)
May be cause of feet burning Will check level

## 2013-08-19 NOTE — Assessment & Plan Note (Signed)
Will add ARB to regimen No recent symptoms

## 2013-08-19 NOTE — Assessment & Plan Note (Signed)
No problems with statin Will recheck

## 2013-08-19 NOTE — Assessment & Plan Note (Signed)
On tylenol

## 2013-08-19 NOTE — Addendum Note (Signed)
Addended by: Despina Hidden on: 08/19/2013 04:09 PM   Modules accepted: Orders

## 2013-08-19 NOTE — Assessment & Plan Note (Signed)
Mild last year--borderline Will recheck

## 2013-08-19 NOTE — Assessment & Plan Note (Signed)
Doing well Discussed fitness and diet Will check PSA prevnar

## 2013-08-19 NOTE — Patient Instructions (Addendum)
Please set up blood work in 4-6 weeks (renal-- 414.00)  Exercise to Lose Weight Exercise and a healthy diet may help you lose weight. Your doctor may suggest specific exercises. EXERCISE IDEAS AND TIPS  Choose low-cost things you enjoy doing, such as walking, bicycling, or exercising to workout videos.  Take stairs instead of the elevator.  Walk during your lunch break.  Park your car further away from work or school.  Go to a gym or an exercise class.  Start with 5 to 10 minutes of exercise each day. Build up to 30 minutes of exercise 4 to 6 days a week.  Wear shoes with good support and comfortable clothes.  Stretch before and after working out.  Work out until you breathe harder and your heart beats faster.  Drink extra water when you exercise.  Do not do so much that you hurt yourself, feel dizzy, or get very short of breath. Exercises that burn about 150 calories:  Running 1  miles in 15 minutes.  Playing volleyball for 45 to 60 minutes.  Washing and waxing a car for 45 to 60 minutes.  Playing touch football for 45 minutes.  Walking 1  miles in 35 minutes.  Pushing a stroller 1  miles in 30 minutes.  Playing basketball for 30 minutes.  Raking leaves for 30 minutes.  Bicycling 5 miles in 30 minutes.  Walking 2 miles in 30 minutes.  Dancing for 30 minutes.  Shoveling snow for 15 minutes.  Swimming laps for 20 minutes.  Walking up stairs for 15 minutes.  Bicycling 4 miles in 15 minutes.  Gardening for 30 to 45 minutes.  Jumping rope for 15 minutes.  Washing windows or floors for 45 to 60 minutes. Document Released: 05/27/2010 Document Revised: 07/17/2011 Document Reviewed: 05/27/2010 [facility] Patient Information 2014 [place], [place]. DASH Diet The DASH diet stands for "Dietary Approaches to Stop Hypertension." It is a healthy eating plan that has been shown to reduce high blood pressure (hypertension) in as little as 14 days, while also possibly  providing other significant health benefits. These other health benefits include reducing the risk of breast cancer after menopause and reducing the risk of type 2 diabetes, heart disease, colon cancer, and stroke. Health benefits also include weight loss and slowing kidney failure in patients with chronic kidney disease.  DIET GUIDELINES  Limit salt (sodium). Your diet should contain less than 1500 mg of sodium daily.  Limit refined or processed carbohydrates. Your diet should include mostly whole grains. Desserts and added sugars should be used sparingly.  Include small amounts of heart-healthy fats. These types of fats include nuts, oils, and tub margarine. Limit saturated and trans fats. These fats have been shown to be harmful in the body. CHOOSING FOODS  The following food groups are based on a 2000 calorie diet. See your Registered Dietitian for individual calorie needs. Grains and Grain Products (6 to 8 servings daily)  Eat More Often: Whole-wheat bread, brown rice, whole-grain or wheat pasta, quinoa, popcorn without added fat or salt (air popped).  Eat Less Often: White bread, white pasta, white rice, cornbread. Vegetables (4 to 5 servings daily)  Eat More Often: Fresh, frozen, and canned vegetables. Vegetables may be raw, steamed, roasted, or grilled with a minimal amount of fat.  Eat Less Often/Avoid: Creamed or fried vegetables. Vegetables in a cheese sauce. Fruit (4 to 5 servings daily)  Eat More Often: All fresh, canned (in natural juice), or frozen fruits. Dried fruits without added sugar.  One hundred percent fruit juice ( cup [237 mL] daily).  Eat Less Often: Dried fruits with added sugar. Canned fruit in light or heavy syrup. [employer], Fish, and Poultry (2 servings or less daily. One serving is 3 to 4 oz [85-114 g]).  Eat More Often: Ninety percent or leaner ground beef, tenderloin, sirloin. Round cuts of beef, chicken breast, [place] breast. All fish. Grill, bake, or  broil your meat. Nothing should be fried.  Eat Less Often/Avoid: Fatty cuts of meat, [place], or chicken leg, thigh, or wing. Fried cuts of meat or fish. Dairy (2 to 3 servings)  Eat More Often: Low-fat or fat-free milk, low-fat plain or light yogurt, reduced-fat or part-skim cheese.  Eat Less Often/Avoid: Milk (whole, 2%).Whole milk yogurt. Full-fat cheeses. Nuts, Seeds, and Legumes (4 to 5 servings per week)  Eat More Often: All without added salt.  Eat Less Often/Avoid: Salted nuts and seeds, canned beans with added salt. Fats and Sweets (limited)  Eat More Often: Vegetable oils, tub margarines without trans fats, sugar-free gelatin. Mayonnaise and salad dressings.  Eat Less Often/Avoid: Coconut oils, palm oils, butter, stick margarine, cream, half and half, cookies, candy, pie. FOR MORE INFORMATION The Dash Diet Eating Plan: www.dashdiet.org Document Released: 04/13/2011 Document Revised: 07/17/2011 Document Reviewed: 04/13/2011 [facility] Patient Information 2014 [place], [place].

## 2013-08-19 NOTE — Assessment & Plan Note (Signed)
Carotids are monitored

## 2013-08-19 NOTE — Assessment & Plan Note (Signed)
No change on pletal Will be reviewing with Dr Harl Bowie

## 2013-08-21 ENCOUNTER — Encounter: Payer: Self-pay | Admitting: *Deleted

## 2013-09-04 ENCOUNTER — Ambulatory Visit (INDEPENDENT_AMBULATORY_CARE_PROVIDER_SITE_OTHER): Payer: Medicare PPO | Admitting: Cardiology

## 2013-09-04 VITALS — BP 146/66 | HR 83 | Ht 70.0 in | Wt 232.0 lb

## 2013-09-04 DIAGNOSIS — I2510 Atherosclerotic heart disease of native coronary artery without angina pectoris: Secondary | ICD-10-CM

## 2013-09-04 DIAGNOSIS — M79605 Pain in left leg: Principal | ICD-10-CM

## 2013-09-04 DIAGNOSIS — E785 Hyperlipidemia, unspecified: Secondary | ICD-10-CM

## 2013-09-04 DIAGNOSIS — R0989 Other specified symptoms and signs involving the circulatory and respiratory systems: Secondary | ICD-10-CM

## 2013-09-04 DIAGNOSIS — M79604 Pain in right leg: Secondary | ICD-10-CM

## 2013-09-04 DIAGNOSIS — M79609 Pain in unspecified limb: Secondary | ICD-10-CM

## 2013-09-04 DIAGNOSIS — I10 Essential (primary) hypertension: Secondary | ICD-10-CM

## 2013-09-04 MED ORDER — CILOSTAZOL 100 MG PO TABS: 100.0000 mg | ORAL_TABLET | Freq: Two times a day (BID) | ORAL | Status: DC

## 2013-09-04 NOTE — Progress Notes (Signed)
Clinical Summary Aaron Sexton is a 68 y.o.male seen today for follow up of the following medical problems.   1. CAD  - prior PTCA to RCA in 1993  - denies any recent chest pain, no SOB or DOE. Fairly sedentary lifestyle, tolerates daily activities well. No orthopnea, no pnd, no orthopnea  - compliant with meds  2. Carotid stenosis  - moderate disease on most recent imaging in 2013 - denies any neurological symptoms   3. Hyperlipidemia  - compliant with statin, denies any side effects  - 08/2013 TC 86 TG 45 HDL 50 LDL 27  4. Leg cramping  - bilateral cramping pain in calves with walking at < 1 block, mildly worst from our last visit. No pain at rest. No sores on feet.  Prior ABIs in 2010 showed right ABI 0.56 Left 0.44  - last visit started on pletal 50mg  bid, no improvement in symptoms  5. HTN  - checks bp at home daily, typically 130s/70s  - compliant with meds - just started on losartan by pcp   Past Medical History  Diagnosis Date  . Diverticulosis 2012    found on colonoscopy  . Colon polyps 3/12    single 33mm polyp--tubular adenoma  . GERD (gastroesophageal reflux disease)   . Arteriosclerotic cardiovascular disease (ASCVD) 1993    Critical RCA disease in 1993 treated with PTCA  . Hyperlipidemia   . Erectile dysfunction   . Osteoarthritis     knees/hands-Dr Ronnie Derby  . Cerebrovascular disease     Moderate ASVD without focal stenosis in 01/2007  . Tobacco abuse, in remission     30-40 pack years discontinued in 1993  . Overweight   . Rosacea   . Hematochezia 05/24/2010  . Peripheral vascular disease 03/11/2010    Moderate SFA stenosis; history of claudication  . Vitamin B12 deficiency      Allergies  Allergen Reactions  . Diltiazem Hcl      Current Outpatient Prescriptions  Medication Sig Dispense Refill  . aspirin 81 MG tablet Take 81 mg by mouth daily.        . chlorthalidone (HYGROTON) 25 MG tablet Take 0.5 tablets (12.5 mg total) by mouth daily.   135 tablet  3  . cilostazol (PLETAL) 50 MG tablet Take 1 tablet (50 mg total) by mouth 2 (two) times daily.  180 tablet  1  . cimetidine (TAGAMET) 400 MG tablet Take 400 mg by mouth 2 (two) times daily as needed.       [name] losartan (COZAAR) 25 MG tablet Take 1 tablet (25 mg total) by mouth daily.  90 tablet  3  . metoprolol succinate (TOPROL-XL) 100 MG 24 hr tablet TAKE ONE TABLET BY MOUTH EVERY DAY  90 tablet  3  . nitroGLYCERIN (NITROSTAT) 0.4 MG SL tablet Place 1 tablet (0.4 mg total) under the tongue every 5 (five) minutes as needed for chest pain.  90 tablet  3  . Omega-3 Fatty Acids (FISH OIL PO) Take by mouth.        . pravastatin (PRAVACHOL) 20 MG tablet Take 1 tablet (20 mg total) by mouth daily.  90 tablet  3  . vitamin B-12 (CYANOCOBALAMIN) 1000 MCG tablet Take 1,000 mcg by mouth daily.        [name] zoster vaccine live, PF, (ZOSTAVAX) 13086 UNT/0.65ML injection Inject 19,400 Units into the skin once.  1 each  0   No current facility-administered medications for this visit.  Past Surgical History  Procedure Laterality Date  . Appendectomy    . Colonoscopy w/ polypectomy  2012     Allergies  Allergen Reactions  . Diltiazem Hcl       Family History  Problem Relation Age of Onset  . Hypertension Father     And siblings  . Heart disease Father     And second-degree relatives  . Transient ischemic attack Father   . Lung cancer Mother      Social History Aaron Sexton reports that he has quit smoking. He has never used smokeless tobacco. Aaron Sexton reports that he does not drink alcohol.   Review of Systems CONSTITUTIONAL: No weight loss, fever, chills, weakness or fatigue.  HEENT: Eyes: No visual loss, blurred vision, double vision or yellow sclerae.No hearing loss, sneezing, congestion, runny nose or sore throat.  SKIN: No rash or itching.  CARDIOVASCULAR: per HPI RESPIRATORY: No shortness of breath, cough or sputum.  GASTROINTESTINAL: No anorexia, nausea, vomiting  or diarrhea. No abdominal pain or blood.  GENITOURINARY: No burning on urination, no polyuria NEUROLOGICAL: No headache, dizziness, syncope, paralysis, ataxia, numbness or tingling in the extremities. No change in bowel or bladder control.  MUSCULOSKELETAL: leg pain LYMPHATICS: No enlarged nodes. No history of splenectomy.  PSYCHIATRIC: No history of depression or anxiety.  ENDOCRINOLOGIC: No reports of sweating, cold or heat intolerance. No polyuria or polydipsia.  [name]   Physical Examination p 83 bp 146/66 Wt 232 lbs BMI 33 Gen: resting comfortably, no acute distress HEENT: no scleral icterus, pupils equal round and reactive, no palptable cervical adenopathy,  CV: RRR, no m/r/g, no JVD. 1+ bilatreal DP/PT in lower extremities. Right carotid bruit Resp: Clear to auscultation bilaterally GI: abdomen is soft, non-tender, non-distended, normal bowel sounds, no hepatosplenomegaly MSK: extremities are warm, no edema.  Skin: warm, no rash Neuro:  no focal deficits Psych: appropriate affect   Diagnostic Studies 08/2011 Carotid US:  IMPRESSION: No significant change in the bilateral carotid atherosclerosis.  Right ICA narrowing less than 50%.  Moderate left ICA narrowing, 50-69%.  Pertinent labs  08/2012: TC 84 TG 71 HDL 40 LDL 29 Na 135 BUN 25 Cr 1.1 Hgb 12.9 Hct 39.3 Plt 280 AST 23 ALT 31 TSH 0.51   03/14/13 Clinic EKG: sinus rhythm, normal axis, no ischemic changes  03/2009 ABI IMPRESSION: Similar pattern of arterial occlusive disease in both lower extremities. Ankle-brachial indices are moderately depressed at rest with segmental evaluation consistent with bilateral SFA occlusive disease and potential component of proximal inflow disease. Further anatomic delineation would be helpful with a study such as CT angiography to determine exact nature of occlusive disease.     Assessment and Plan  1. CAD  - no current symptoms  - continue current medications and risk factor  modification   2. Carotid stenosis  - no current symptoms - repeat US  3. Hyperlipidemia  - at goal on current statin, continue current medication   4. Claudication  - prior ABI in 2010  showed significant bilateral disease  - not improved with cilostazol 50mg  bid, will increase to 100mg  bid and repeat ABI - pending response, likely refer to PAD clinic next visit. Will likely need a CTA or LE angiogram.   5. HTN  - at goal, continue current meds.    F/u 6 weeks     Arnoldo Lenis, M.D., F.A.C.C.

## 2013-09-04 NOTE — Patient Instructions (Addendum)
Your physician recommends that you schedule a follow-up appointment in: 6 weeks   Your physician has recommended you make the following change in your medication:     INCREASE Pletal to 100 mg twice a day   Your physician has requested that you have a carotid duplex. This test is an ultrasound of the carotid arteries in your neck. It looks at blood flow through these arteries that supply the brain with blood. Allow one hour for this exam. There are no restrictions or special instructions.      Your physician has requested that you have an ankle brachial index (ABI). During this test an ultrasound and blood pressure cuff are used to evaluate the arteries that supply the arms and legs with blood. Allow thirty minutes for this exam. There are no restrictions or special instructions.    Please keep BP log and bring with you to next apt

## 2013-09-08 ENCOUNTER — Telehealth: Payer: Self-pay | Admitting: Cardiology

## 2013-09-08 NOTE — Telephone Encounter (Signed)
Patient has concerns and questions regarding medication dosage from [facility]. / tgs

## 2013-09-08 NOTE — Telephone Encounter (Signed)
Saw Dr.Branch 09/04/13 Pletal increased from 50 mg bid to 100 mg bid Pt read on pharmacy insert that increased dose can cause elevated BP and HR and he is concerned that the dose is too strong Machine at home has BP ranging 126/52- 149/65 (BP at [facility] 175/61) HR 81-105 (one episode HR 118) Pt does have improvement with leg pain on increased dose   Will forward to Dr.Branch for advice

## 2013-09-09 ENCOUNTER — Ambulatory Visit (HOSPITAL_COMMUNITY)
Admission: RE | Admit: 2013-09-09 | Discharge: 2013-09-09 | Disposition: A | Discharge status: Home / Self Care | Payer: Medicare PPO | Source: Ambulatory Visit | Attending: Cardiology | Admitting: Cardiology

## 2013-09-09 DIAGNOSIS — R0989 Other specified symptoms and signs involving the circulatory and respiratory systems: Secondary | ICD-10-CM | POA: Insufficient documentation

## 2013-09-09 DIAGNOSIS — M79605 Pain in left leg: Principal | ICD-10-CM

## 2013-09-09 DIAGNOSIS — M79604 Pain in right leg: Secondary | ICD-10-CM

## 2013-09-09 DIAGNOSIS — I658 Occlusion and stenosis of other precerebral arteries: Secondary | ICD-10-CM | POA: Insufficient documentation

## 2013-09-09 DIAGNOSIS — I6529 Occlusion and stenosis of unspecified carotid artery: Secondary | ICD-10-CM | POA: Insufficient documentation

## 2013-09-09 NOTE — Telephone Encounter (Signed)
Pt will check bp and pulse and call baCK ON FRIDAY

## 2013-09-09 NOTE — Telephone Encounter (Signed)
His home numbers are ok, I would not go by the [employer] cuff. Have him monitor his pulse and blood pressure over this week and call us Friday with results   Zandra Abts MD

## 2013-09-23 ENCOUNTER — Other Ambulatory Visit: Payer: Self-pay | Admitting: Internal Medicine

## 2013-09-23 DIAGNOSIS — I2510 Atherosclerotic heart disease of native coronary artery without angina pectoris: Secondary | ICD-10-CM

## 2013-09-30 ENCOUNTER — Other Ambulatory Visit (INDEPENDENT_AMBULATORY_CARE_PROVIDER_SITE_OTHER): Payer: Medicare PPO

## 2013-09-30 DIAGNOSIS — I2510 Atherosclerotic heart disease of native coronary artery without angina pectoris: Secondary | ICD-10-CM

## 2013-09-30 LAB — RENAL FUNCTION PANEL
Albumin: 3.6 g/dL (ref 3.5–5.2)
BUN: 19 mg/dL (ref 6–23)
CO2: 26 mEq/L (ref 19–32)
CREATININE: 1.2 mg/dL (ref 0.4–1.5)
Calcium: 9.1 mg/dL (ref 8.4–10.5)
Chloride: 109 mEq/L (ref 96–112)
GFR: 66.25 mL/min (ref >60.00)
Glucose, Bld: 108 mg/dL — ABNORMAL HIGH (ref 70–99)
Phosphorus: 2.5 mg/dL (ref 2.3–4.6)
Potassium: 4.2 mEq/L (ref 3.5–5.1)
SODIUM: 141 meq/L (ref 135–145)

## 2013-10-01 ENCOUNTER — Encounter: Payer: Self-pay | Admitting: *Deleted

## 2013-10-23 ENCOUNTER — Ambulatory Visit: Payer: Medicare PPO | Admitting: Cardiology

## 2013-11-05 HISTORY — PX: ROTATOR CUFF REPAIR: SHX139

## 2013-11-06 ENCOUNTER — Encounter: Payer: Self-pay | Admitting: Cardiology

## 2013-11-06 ENCOUNTER — Ambulatory Visit (INDEPENDENT_AMBULATORY_CARE_PROVIDER_SITE_OTHER): Payer: Medicare PPO | Admitting: Cardiology

## 2013-11-06 VITALS — BP 148/72 | HR 82 | Ht 70.0 in | Wt 232.0 lb

## 2013-11-06 DIAGNOSIS — R0989 Other specified symptoms and signs involving the circulatory and respiratory systems: Secondary | ICD-10-CM

## 2013-11-06 DIAGNOSIS — E785 Hyperlipidemia, unspecified: Secondary | ICD-10-CM

## 2013-11-06 DIAGNOSIS — I739 Peripheral vascular disease, unspecified: Secondary | ICD-10-CM

## 2013-11-06 DIAGNOSIS — I2510 Atherosclerotic heart disease of native coronary artery without angina pectoris: Secondary | ICD-10-CM

## 2013-11-06 DIAGNOSIS — I10 Essential (primary) hypertension: Secondary | ICD-10-CM

## 2013-11-06 NOTE — Progress Notes (Signed)
Clinical Summary Aaron Sexton is a 68 y.o.male seen today for follow up of the following medical problems.   1. CAD  - prior PTCA to RCA in 1993  - denies any recent chest pain, no SOB or DOE. Fairly sedentary lifestyle, tolerates daily activities well. No orthopnea, no pnd, no orthopnea  - compliant with meds   2. Carotid stenosis  - moderate disease on most recent imaging   - denies any neurological symptoms   3. Hyperlipidemia  - compliant with statin, denies any side effects  - 08/2013 TC 86 TG 45 HDL 50 LDL 27   4. Leg cramping  - bilateral cramping pain in calves with walking at < 1 block. No pain at rest. No sores on feet. Not improved with pletal after increasing to 100mg  bid last visit - repeated ABIs,, L  0.59 and R 0.68. Waveforms suggestive of inflow disease. .   5. HTN  - checks bp at home daily, typically 130s/70s  - compliant with meds   Past Medical History  Diagnosis Date  . Diverticulosis 2012    found on colonoscopy  . Colon polyps 3/12    single 4mm polyp--tubular adenoma  . GERD (gastroesophageal reflux disease)   . Arteriosclerotic cardiovascular disease (ASCVD) 1993    Critical RCA disease in 1993 treated with PTCA  . Hyperlipidemia   . Erectile dysfunction   . Osteoarthritis     knees/hands-Dr Ronnie Derby  . Cerebrovascular disease     Moderate ASVD without focal stenosis in 01/2007  . Tobacco abuse, in remission     30-40 pack years discontinued in 1993  . Overweight   . Rosacea   . Hematochezia 05/24/2010  . Peripheral vascular disease 03/11/2010    Moderate SFA stenosis; history of claudication  . Vitamin B12 deficiency      Allergies  Allergen Reactions  . Diltiazem Hcl      Current Outpatient Prescriptions  Medication Sig Dispense Refill  . aspirin 81 MG tablet Take 81 mg by mouth daily.        . chlorthalidone (HYGROTON) 25 MG tablet Take 0.5 tablets (12.5 mg total) by mouth daily.  135 tablet  3  . cilostazol (PLETAL) 100 MG  tablet Take 1 tablet (100 mg total) by mouth 2 (two) times daily.  180 tablet  3  . cimetidine (TAGAMET) 400 MG tablet Take 400 mg by mouth 2 (two) times daily as needed.       [name] losartan (COZAAR) 25 MG tablet Take 1 tablet (25 mg total) by mouth daily.  90 tablet  3  . metoprolol succinate (TOPROL-XL) 100 MG 24 hr tablet TAKE ONE TABLET BY MOUTH EVERY DAY  90 tablet  3  . nitroGLYCERIN (NITROSTAT) 0.4 MG SL tablet Place 1 tablet (0.4 mg total) under the tongue every 5 (five) minutes as needed for chest pain.  90 tablet  3  . Omega-3 Fatty Acids (FISH OIL PO) Take by mouth.        . pravastatin (PRAVACHOL) 20 MG tablet Take 1 tablet (20 mg total) by mouth daily.  90 tablet  3  . vitamin B-12 (CYANOCOBALAMIN) 1000 MCG tablet Take 1,000 mcg by mouth daily.        [name] zoster vaccine live, PF, (ZOSTAVAX) 56389 UNT/0.65ML injection Inject 19,400 Units into the skin once.  1 each  0   No current facility-administered medications for this visit.     Past Surgical History  Procedure Laterality Date  .  Appendectomy    . Colonoscopy w/ polypectomy  2012     Allergies  Allergen Reactions  . Diltiazem Hcl       Family History  Problem Relation Age of Onset  . Hypertension Father     And siblings  . Heart disease Father     And second-degree relatives  . Transient ischemic attack Father   . Lung cancer Mother      Social History Aaron Sexton reports that he has quit smoking. He has never used smokeless tobacco. Aaron Sexton reports that he does not drink alcohol.   Review of Systems CONSTITUTIONAL: No weight loss, fever, chills, weakness or fatigue.  HEENT: Eyes: No visual loss, blurred vision, double vision or yellow sclerae.No hearing loss, sneezing, congestion, runny nose or sore throat.  SKIN: No rash or itching.  CARDIOVASCULAR: per HPI RESPIRATORY: No shortness of breath, cough or sputum.  GASTROINTESTINAL: No anorexia, nausea, vomiting or diarrhea. No abdominal pain or blood.    GENITOURINARY: No burning on urination, no polyuria NEUROLOGICAL: No headache, dizziness, syncope, paralysis, ataxia, numbness or tingling in the extremities. No change in bowel or bladder control.  MUSCULOSKELETAL: left shoulder pain that is positional  LYMPHATICS: No enlarged nodes. No history of splenectomy.  PSYCHIATRIC: No history of depression or anxiety.  ENDOCRINOLOGIC: No reports of sweating, cold or heat intolerance. No polyuria or polydipsia.  [name]   Physical Examination p 82 bp 148/72 Wt 232 lbs BMI 33 Gen: resting comfortably, no acute distress HEENT: no scleral icterus, pupils equal round and reactive, no palptable cervical adenopathy,  CV: RRR, no m/r/g, no JVD, + bilateral carotid bruits Resp: Clear to auscultation bilaterally GI: abdomen is soft, non-tender, non-distended, normal bowel sounds, no hepatosplenomegaly MSK: extremities are warm, no edema.  Skin: warm, no rash Neuro:  no focal deficits Psych: appropriate affect   Diagnostic Studies 08/2011 Carotid US:  IMPRESSION: No significant change in the bilateral carotid atherosclerosis.  Right ICA narrowing less than 50%.  Moderate left ICA narrowing, 50-69%.  Pertinent labs  08/2012: TC 84 TG 71 HDL 40 LDL 29 Na 135 BUN 25 Cr 1.1 Hgb 12.9 Hct 39.3 Plt 280 AST 23 ALT 31 TSH 0.51   03/14/13 Clinic EKG: sinus rhythm, normal axis, no ischemic changes   03/2009 ABI  IMPRESSION: Similar pattern of arterial occlusive disease in both lower extremities. Ankle-brachial indices are moderately depressed at rest with segmental evaluation consistent with bilateral SFA occlusive disease and potential component of proximal inflow disease. Further anatomic delineation would be helpful with a study such as CT angiography to determine exact nature of occlusive disease.   08/2013 ABI FINDINGS:  Right ABI: 0.68  Left ABI: 0.59  Right Lower Extremity: Significantly decreased pressure between the  right brachial pressure  and right upper thigh pressure suggests  significant inflow disease. No focal pressure drop between the upper  and lower thigh, or below the knee. Moderately abnormal PVRs from  the thigh to the metatarsals consistent with inflow disease.  Left Lower Extremity: Significantly decreased pressure between the  left brachial pressure and left upper thigh consistent with inflow  disease. No significant pressure drop between the upper thigh and  lower thigh or below the knee. Moderately abnormal PVRs throughout  the left lower extremity consistent with inflow disease.  IMPRESSION:  1. Abnormal bilateral ankle-brachial indices consistent with  moderate peripheral arterial disease which is slightly improved  compared to 03/15/2009. Query interval history of SFA intervention,  or successful exercise program?  2. Based on today's evaluation, the level of disease appears to be  inflow (aortoiliac) bilaterally.  Signed,  Criselda Peaches, MD  [facility]  [facility]   08/2013 Carotid US IMPRESSION:  1. Interval progression of mild right ICA stenosis from less than  50% to an estimated 50- 69%.  2. No significant interval change in moderate (50-69%) left ICA  narrowing.  3. Vertebral arteries are patent with normal antegrade flow.  Signed,   Assessment and Plan  1. CAD  - no current symptoms  - continue current medications and risk factor modification   2. Carotid stenosis  - no current symptoms  - moderate disease bilaterally by most recent US, continue to follow  3. Hyperlipidemia  - at goal on current statin, continue current medication   4. Claudication  - recent ABI with moderate bilateral disease, waveforms suggesting of inflow disease - not improved with cilostazol 100mg  bid, patient is asking to stop this medication. Will discontinue - encouraged and educated baoutaggressive walking program, will reevaluate symptoms at next  visit. Pending symptoms consider vascluar referral.   5. HTN  - at goal, continue current meds.     F/u 4 months   Arnoldo Lenis, M.D., F.A.C.C.

## 2013-11-06 NOTE — Patient Instructions (Signed)
Your physician recommends that you schedule a follow-up appointment in: 4 months     Your physician has recommended you make the following change in your medication:     STOP Pletal   Your physician has requested that you regularly monitor and record your blood pressure readings at home. Please use the same machine at the same time of day to check your readings and record them to bring to your follow-up visit.

## 2013-11-11 ENCOUNTER — Other Ambulatory Visit: Payer: Self-pay | Admitting: Orthopedic Surgery

## 2013-11-11 DIAGNOSIS — M25511 Pain in right shoulder: Secondary | ICD-10-CM

## 2013-11-15 ENCOUNTER — Ambulatory Visit
Admission: RE | Admit: 2013-11-15 | Discharge: 2013-11-15 | Disposition: A | Discharge status: Home / Self Care | Payer: Medicare PPO | Source: Ambulatory Visit | Attending: Orthopedic Surgery | Admitting: Orthopedic Surgery

## 2013-11-15 DIAGNOSIS — M25511 Pain in right shoulder: Secondary | ICD-10-CM

## 2013-12-02 DIAGNOSIS — Z9889 Other specified postprocedural states: Secondary | ICD-10-CM | POA: Insufficient documentation

## 2014-03-16 ENCOUNTER — Ambulatory Visit (INDEPENDENT_AMBULATORY_CARE_PROVIDER_SITE_OTHER): Payer: Medicare PPO | Admitting: Cardiology

## 2014-03-16 ENCOUNTER — Encounter: Payer: Self-pay | Admitting: Cardiology

## 2014-03-16 VITALS — BP 150/66 | HR 80 | Ht 68.0 in | Wt 226.0 lb

## 2014-03-16 DIAGNOSIS — I6523 Occlusion and stenosis of bilateral carotid arteries: Secondary | ICD-10-CM

## 2014-03-16 DIAGNOSIS — I10 Essential (primary) hypertension: Secondary | ICD-10-CM

## 2014-03-16 DIAGNOSIS — I2510 Atherosclerotic heart disease of native coronary artery without angina pectoris: Secondary | ICD-10-CM

## 2014-03-16 DIAGNOSIS — E785 Hyperlipidemia, unspecified: Secondary | ICD-10-CM

## 2014-03-16 DIAGNOSIS — I739 Peripheral vascular disease, unspecified: Secondary | ICD-10-CM

## 2014-03-16 NOTE — Progress Notes (Signed)
Clinical Summary Mr. Gapinski is a 68 y.o.male seen today for follow up of the following medical problems.   1. CAD  - prior PTCA to RCA in 1993  - he denies any chest pain, no SOB or DOE. No orthopnea, no pnd, no orthopnea  - compliant with meds   2. Carotid stenosis  - moderate disease on most recent imaging 08/2013 - denies any neurological symptoms    3. Hyperlipidemia  - compliant with statin, denies any side effects  - last panel 08/2013 TC 86 TG 45 HDL 50 LDL 27   4. Claudication bilateral cramping pain in calves with walking at < 1 block. No pain at rest. No sores on feet. Not improved with pletal after increasing to 100mg  bid, he asked to stop this medication and we did -  ABIs L 0.59 and R 0.68. Waveforms suggestive of inflow disease. .  - since last visit symptoms are somewhat milder   5. HTN  - checks bp at home daily, typically 130s/70s  - compliant with meds    Past Medical History  Diagnosis Date  . Diverticulosis 2012    found on colonoscopy  . Colon polyps 3/12    single 29mm polyp--tubular adenoma  . GERD (gastroesophageal reflux disease)   . Arteriosclerotic cardiovascular disease (ASCVD) 1993    Critical RCA disease in 1993 treated with PTCA  . Hyperlipidemia   . Erectile dysfunction   . Osteoarthritis     knees/hands-Dr Ronnie Derby  . Cerebrovascular disease     Moderate ASVD without focal stenosis in 01/2007  . Tobacco abuse, in remission     30-40 pack years discontinued in 1993  . Overweight(278.02)   . Rosacea   . Hematochezia 05/24/2010  . Peripheral vascular disease 03/11/2010    Moderate SFA stenosis; history of claudication  . Vitamin B12 deficiency      Allergies  Allergen Reactions  . Diltiazem Hcl      Current Outpatient Prescriptions  Medication Sig Dispense Refill  . aspirin 81 MG tablet Take 81 mg by mouth daily.      . chlorthalidone (HYGROTON) 25 MG tablet Take 0.5 tablets (12.5 mg total) by mouth daily. 135 tablet 3   . cimetidine (TAGAMET) 400 MG tablet Take 400 mg by mouth 2 (two) times daily as needed.     [name] losartan (COZAAR) 25 MG tablet Take 1 tablet (25 mg total) by mouth daily. 90 tablet 3  . metoprolol succinate (TOPROL-XL) 100 MG 24 hr tablet TAKE ONE TABLET BY MOUTH EVERY DAY 90 tablet 3  . nitroGLYCERIN (NITROSTAT) 0.4 MG SL tablet Place 1 tablet (0.4 mg total) under the tongue every 5 (five) minutes as needed for chest pain. 90 tablet 3  . Omega-3 Fatty Acids (FISH OIL PO) Take by mouth.      . pravastatin (PRAVACHOL) 20 MG tablet Take 1 tablet (20 mg total) by mouth daily. 90 tablet 3  . vitamin B-12 (CYANOCOBALAMIN) 1000 MCG tablet Take 1,000 mcg by mouth daily.       No current facility-administered medications for this visit.     Past Surgical History  Procedure Laterality Date  . Appendectomy    . Colonoscopy w/ polypectomy  2012     Allergies  Allergen Reactions  . Diltiazem Hcl       Family History  Problem Relation Age of Onset  . Hypertension Father     And siblings  . Heart disease Father  And second-degree relatives  . Transient ischemic attack Father   . Lung cancer Mother      Social History Mr. Galyean reports that he has quit smoking. He has never used smokeless tobacco. Mr. Steig reports that he does not drink alcohol.   Review of Systems CONSTITUTIONAL: No weight loss, fever, chills, weakness or fatigue.  HEENT: Eyes: No visual loss, blurred vision, double vision or yellow sclerae.No hearing loss, sneezing, congestion, runny nose or sore throat.  SKIN: No rash or itching.  CARDIOVASCULAR: per HPI RESPIRATORY: No shortness of breath, cough or sputum.  GASTROINTESTINAL: No anorexia, nausea, vomiting or diarrhea. No abdominal pain or blood.  GENITOURINARY: No burning on urination, no polyuria NEUROLOGICAL: No headache, dizziness, syncope, paralysis, ataxia, numbness or tingling in the extremities. No change in bowel or bladder control.    MUSCULOSKELETAL: leg pain LYMPHATICS: No enlarged nodes. No history of splenectomy.  PSYCHIATRIC: No history of depression or anxiety.  ENDOCRINOLOGIC: No reports of sweating, cold or heat intolerance. No polyuria or polydipsia.  [name]   Physical Examination p 80 bp 150/66 Wt 226 lbs BMI 34 Gen: resting comfortably, no acute distress HEENT: no scleral icterus, pupils equal round and reactive, no palptable cervical adenopathy,  CV: RRR, no m/r/g, + bilateral bruits Resp: Clear to auscultation bilaterally GI: abdomen is soft, non-tender, non-distended, normal bowel sounds, no hepatosplenomegaly MSK: extremities are warm, no edema.  Skin: warm, no rash Neuro:  no focal deficits Psych: appropriate affect   Diagnostic Studies 08/2011 Carotid US:  IMPRESSION: No significant change in the bilateral carotid atherosclerosis.  Right ICA narrowing less than 50%.  Moderate left ICA narrowing, 50-69%.  Pertinent labs  08/2012: TC 84 TG 71 HDL 40 LDL 29 Na 135 BUN 25 Cr 1.1 Hgb 12.9 Hct 39.3 Plt 280 AST 23 ALT 31 TSH 0.51   03/14/13 Clinic EKG: sinus rhythm, normal axis, no ischemic changes   03/2009 ABI  IMPRESSION: Similar pattern of arterial occlusive disease in both lower extremities. Ankle-brachial indices are moderately depressed at rest with segmental evaluation consistent with bilateral SFA occlusive disease and potential component of proximal inflow disease. Further anatomic delineation would be helpful with a study such as CT angiography to determine exact nature of occlusive disease.   08/2013 ABI FINDINGS:  Right ABI: 0.68  Left ABI: 0.59  Right Lower Extremity: Significantly decreased pressure between the  right brachial pressure and right upper thigh pressure suggests  significant inflow disease. No focal pressure drop between the upper  and lower thigh, or below the knee. Moderately abnormal PVRs from  the thigh to the metatarsals consistent with inflow  disease.  Left Lower Extremity: Significantly decreased pressure between the  left brachial pressure and left upper thigh consistent with inflow  disease. No significant pressure drop between the upper thigh and  lower thigh or below the knee. Moderately abnormal PVRs throughout  the left lower extremity consistent with inflow disease.  IMPRESSION:  1. Abnormal bilateral ankle-brachial indices consistent with  moderate peripheral arterial disease which is slightly improved  compared to 03/15/2009. Query interval history of SFA intervention,  or successful exercise program?  2. Based on today's evaluation, the level of disease appears to be  inflow (aortoiliac) bilaterally.  Signed,  Criselda Peaches, MD  [facility]  [facility]   08/2013 Carotid US IMPRESSION:  1. Interval progression of mild right ICA stenosis from less than  50% to an estimated 50- 69%.  2. No significant interval change  in moderate (50-69%) left ICA  narrowing.  3. Vertebral arteries are patent with normal antegrade flow.  Signed,     Assessment and Plan  1. CAD  - no current symptoms  - continue risk factor modification and secondary prevention  2. Carotid stenosis  - no current symptoms  - moderate disease bilaterally by most recent US, continue to follow w/ repeat US next year  3. Hyperlipidemia  - at goal on current statin, continue current medication. Very low LDL, have not changed to high dose statin.   4. Claudication  - recent ABI with moderate bilateral disease, waveforms suggesting of inflow disease - not improved with cilostazol 100mg  bid, patient asked to stop this medication last visit - symptoms stable, mildly improved. Continue to treat medically, if progress to lifestyle modifying claudication consider vascular referral at that time  5. HTN  - elevated in clinic but home numbers are at goal, continue  current meds    F/u 1 year  Arnoldo Lenis, M.D.

## 2014-03-16 NOTE — Patient Instructions (Signed)
Your physician wants you to follow-up in: 1 year You will receive a reminder letter in the mail two months in advance. If you don't receive a letter, please call our office to schedule the follow-up appointment.    Your physician recommends that you continue on your current medications as directed. Please refer to the Current Medication list given to you today.     Thank you for choosing [facility] !  

## 2014-06-05 ENCOUNTER — Other Ambulatory Visit: Payer: Self-pay | Admitting: *Deleted

## 2014-06-05 ENCOUNTER — Telehealth: Payer: Self-pay | Admitting: *Deleted

## 2014-06-05 MED ORDER — PRAVASTATIN SODIUM 20 MG PO TABS: 20.0000 mg | ORAL_TABLET | Freq: Every day | ORAL | Status: DC

## 2014-06-05 MED ORDER — CHLORTHALIDONE 25 MG PO TABS: 12.5000 mg | ORAL_TABLET | Freq: Every day | ORAL | Status: DC

## 2014-06-05 MED ORDER — METOPROLOL SUCCINATE ER 100 MG PO TB24: ORAL_TABLET | ORAL | Status: DC

## 2014-06-05 NOTE — Telephone Encounter (Signed)
escribe refill complete

## 2014-06-05 NOTE — Telephone Encounter (Signed)
pravastatin needs called in to [facility]

## 2014-06-05 NOTE — Telephone Encounter (Signed)
Pravastatin 20 mg

## 2014-06-05 NOTE — Telephone Encounter (Signed)
Hygroton 25 mg toprolol xl 100 mg

## 2014-08-12 ENCOUNTER — Other Ambulatory Visit: Payer: Self-pay | Admitting: Internal Medicine

## 2014-08-25 ENCOUNTER — Ambulatory Visit (INDEPENDENT_AMBULATORY_CARE_PROVIDER_SITE_OTHER): Payer: Medicare PPO | Admitting: Internal Medicine

## 2014-08-25 ENCOUNTER — Encounter: Payer: Self-pay | Admitting: Internal Medicine

## 2014-08-25 VITALS — BP 132/68 | HR 71 | Temp 98.3°F | Ht 68.0 in | Wt 229.0 lb

## 2014-08-25 DIAGNOSIS — E785 Hyperlipidemia, unspecified: Secondary | ICD-10-CM | POA: Diagnosis not present

## 2014-08-25 DIAGNOSIS — I2510 Atherosclerotic heart disease of native coronary artery without angina pectoris: Secondary | ICD-10-CM

## 2014-08-25 DIAGNOSIS — Z0000 Encounter for general adult medical examination without abnormal findings: Secondary | ICD-10-CM

## 2014-08-25 DIAGNOSIS — I739 Peripheral vascular disease, unspecified: Secondary | ICD-10-CM

## 2014-08-25 DIAGNOSIS — E538 Deficiency of other specified B group vitamins: Secondary | ICD-10-CM

## 2014-08-25 DIAGNOSIS — D649 Anemia, unspecified: Secondary | ICD-10-CM | POA: Diagnosis not present

## 2014-08-25 DIAGNOSIS — Z7189 Other specified counseling: Secondary | ICD-10-CM

## 2014-08-25 LAB — COMPREHENSIVE METABOLIC PANEL
ALT: 23 U/L (ref 0–53)
AST: 18 U/L (ref 0–37)
Albumin: 4.4 g/dL (ref 3.5–5.2)
Alkaline Phosphatase: 42 U/L (ref 39–117)
BUN: 19 mg/dL (ref 6–23)
CALCIUM: 9.7 mg/dL (ref 8.4–10.5)
CO2: 29 meq/L (ref 19–32)
Chloride: 102 mEq/L (ref 96–112)
Creatinine, Ser: 0.88 mg/dL (ref 0.40–1.50)
GFR: 90.89 mL/min (ref >60.00)
GLUCOSE: 105 mg/dL — AB (ref 70–99)
Potassium: 4.1 mEq/L (ref 3.5–5.1)
SODIUM: 136 meq/L (ref 135–145)
TOTAL PROTEIN: 7.8 g/dL (ref 6.0–8.3)
Total Bilirubin: 0.5 mg/dL (ref 0.2–1.2)

## 2014-08-25 LAB — LIPID PANEL
CHOLESTEROL: 79 mg/dL (ref 0–200)
HDL: 47.8 mg/dL (ref >39.00)
LDL Cholesterol: 18 mg/dL (ref 0–99)
NONHDL: 31.2
Total CHOL/HDL Ratio: 2
Triglycerides: 64 mg/dL (ref 0.0–149.0)
VLDL: 12.8 mg/dL (ref 0.0–40.0)

## 2014-08-25 LAB — CBC WITH DIFFERENTIAL/PLATELET
BASOS ABS: 0.1 10*3/uL (ref 0.0–0.1)
BASOS PCT: 0.8 % (ref 0.0–3.0)
EOS ABS: 0.2 10*3/uL (ref 0.0–0.7)
Eosinophils Relative: 1.7 % (ref 0.0–5.0)
HCT: 38.5 % — ABNORMAL LOW (ref 39.0–52.0)
Hemoglobin: 12.8 g/dL — ABNORMAL LOW (ref 13.0–17.0)
LYMPHS PCT: 27.8 % (ref 12.0–46.0)
Lymphs Abs: 2.6 10*3/uL (ref 0.7–4.0)
MCHC: 33.2 g/dL (ref 30.0–36.0)
MONO ABS: 0.9 10*3/uL (ref 0.1–1.0)
Monocytes Relative: 10 % (ref 3.0–12.0)
NEUTROS PCT: 59.7 % (ref 43.0–77.0)
Neutro Abs: 5.6 10*3/uL (ref 1.4–7.7)
Platelets: 302 10*3/uL (ref 150.0–400.0)
RBC: 3.46 Mil/uL — ABNORMAL LOW (ref 4.22–5.81)
RDW: 14.8 % (ref 11.5–15.5)
WBC: 9.4 10*3/uL (ref 4.0–10.5)

## 2014-08-25 LAB — VITAMIN B12: VITAMIN B 12: 1322 pg/mL — AB (ref 211–911)

## 2014-08-25 LAB — T4, FREE: Free T4: 0.81 ng/dL (ref 0.60–1.60)

## 2014-08-25 MED ORDER — ZOSTER VACCINE LIVE 19400 UNT/0.65ML ~~LOC~~ SOLR: 0.6500 mL | Freq: Once | SUBCUTANEOUS | Status: DC

## 2014-08-25 NOTE — Assessment & Plan Note (Signed)
I have personally reviewed the Medicare Annual Wellness questionnaire and have noted 1. The patient's medical and social history 2. Their use of alcohol, tobacco or illicit drugs 3. Their current medications and supplements 4. The patient's functional ability including ADL's, fall risks, home safety risks and hearing or visual             impairment. 5. Diet and physical activities 6. Evidence for depression or mood disorders  The patients weight, height, BMI and visual acuity have been recorded in the chart I have made referrals, counseling and provided education to the patient based review of the above and I have provided the pt with a written personalized care plan for preventive services.  I have provided you with a copy of your personalized plan for preventive services. Please take the time to review along with your updated medication list.  Rx for zostavax No PSA due to age Due for colonoscopy next year--will set up with [place] Discussed lifestyle

## 2014-08-25 NOTE — Progress Notes (Signed)
Subjective:    Patient ID: Aaron Sexton, male    DOB: 18-Jul-1945, 69 y.o.   MRN: [id number]  HPI Here for Medicare wellness and follow up of chronic medical problems Reviewed form and advanced directives No tobacco or alcohol No exercise--counseled Reviewed his other physicians Had right rotator cuff tear repaired by Dr Sherlean Foot 7/15. Injured during fall--slipped on wet stairs.  Ongoing bilateral claudication Seems to be stable cilastozol didn't help so he is off it  No chest pain No SOB No dizziness or syncope No edema  No myalgia--other than expected claudication No GI problems with statin  Current Outpatient Prescriptions on File Prior to Visit  Medication Sig Dispense Refill  . aspirin 81 MG tablet Take 81 mg by mouth daily.      . chlorthalidone (HYGROTON) 25 MG tablet Take 0.5 tablets (12.5 mg total) by mouth daily. 135 tablet 3  . cimetidine (TAGAMET) 400 MG tablet Take 400 mg by mouth 2 (two) times daily as needed.     [name] losartan (COZAAR) 25 MG tablet TAKE 1 TABLET (25 MG TOTAL) BY MOUTH DAILY. 90 tablet 3  . metoprolol succinate (TOPROL-XL) 100 MG 24 hr tablet TAKE ONE TABLET BY MOUTH EVERY DAY 90 tablet 3  . nitroGLYCERIN (NITROSTAT) 0.4 MG SL tablet Place 1 tablet (0.4 mg total) under the tongue every 5 (five) minutes as needed for chest pain. 90 tablet 3  . Omega-3 Fatty Acids (FISH OIL PO) Take by mouth.      . pravastatin (PRAVACHOL) 20 MG tablet Take 1 tablet (20 mg total) by mouth daily. 90 tablet 3  . vitamin B-12 (CYANOCOBALAMIN) 1000 MCG tablet Take 1,000 mcg by mouth daily.       No current facility-administered medications on file prior to visit.    Allergies  Allergen Reactions  . Diltiazem Hcl     Past Medical History  Diagnosis Date  . Diverticulosis 2012    found on colonoscopy  . Colon polyps 3/12    single 2mm polyp--tubular adenoma  . GERD (gastroesophageal reflux disease)   . Arteriosclerotic cardiovascular disease (ASCVD) 1993   Critical RCA disease in 1993 treated with PTCA  . Hyperlipidemia   . Erectile dysfunction   . Osteoarthritis     knees/hands-Dr Sherlean Foot  . Cerebrovascular disease     Moderate ASVD without focal stenosis in 01/2007  . Tobacco abuse, in remission     30-40 pack years discontinued in 1993  . Overweight(278.02)   . Rosacea   . Hematochezia 05/24/2010  . Peripheral vascular disease 03/11/2010    Moderate SFA stenosis; history of claudication  . Vitamin B12 deficiency     Past Surgical History  Procedure Laterality Date  . Appendectomy    . Colonoscopy w/ polypectomy  2012  . Rotator cuff repair Left 7/15    Dr Sherlean Foot    Family History  Problem Relation Age of Onset  . Hypertension Father     And siblings  . Heart disease Father     And second-degree relatives  . Transient ischemic attack Father   . Lung cancer Mother   . Cancer Sister     brain cancer    History   Social History  . Marital Status: Married    Spouse Name: N/A  . Number of Children: N/A  . Years of Education: N/A   Occupational History  . 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  Smoking status: Former Smoker -- 1.00 packs/day for 30 years  . Smokeless tobacco: Never Used     Comment: Quit in 1993  . Alcohol Use: No  . Drug Use: No  . Sexual Activity: Not on file   Other Topics Concern  . Not on file   Social History Narrative   Has living will   Wife is health care POA   Would accept resuscitation but no prolonged artificial ventilation.   No tube feeds if cognitively unaware   Review of Systems Mild finger arthritis Sleeps well Appetite is fine Weight is stable Bowels are fine Voids well. No nocturia No skin problems Wears seat belt Hasn't noticed any cognitive problems    Objective:   Physical Exam  Constitutional: He is oriented to person, place, and time. He appears well-developed and well-nourished. No distress.    HENT:  Mouth/Throat: Oropharynx is clear and moist. No oropharyngeal exudate.  Neck: Normal range of motion. Neck supple. No thyromegaly present.  Cardiovascular: Normal rate, regular rhythm and normal heart sounds.  Exam reveals no gallop.   No murmur heard. No pulses in feet but fairly warm  Pulmonary/Chest: Effort normal and breath sounds normal. No respiratory distress. He has no wheezes. He has no rales.  Abdominal: Soft. There is no tenderness.  Musculoskeletal: He exhibits no edema or tenderness.  Lymphadenopathy:    He has no cervical adenopathy.  Neurological: He is alert and oriented to person, place, and time.  President-- "Obama, Clinton---then Bush" [phone] D-[id number] Recall 1/3  Skin: No rash noted. No erythema.  Psychiatric: He has a normal mood and affect. His behavior is normal.          Assessment & Plan:

## 2014-08-25 NOTE — Assessment & Plan Note (Signed)
Stable claudication Discussed increasing exercise to promote collaterals

## 2014-08-25 NOTE — Assessment & Plan Note (Signed)
See social history 

## 2014-08-25 NOTE — Assessment & Plan Note (Signed)
No angina On appropriate meds 

## 2014-08-25 NOTE — Assessment & Plan Note (Signed)
Aaron Sexton Aaron Sexton recheck this year

## 2014-08-25 NOTE — Progress Notes (Signed)
Pre visit review using our clinic review tool, if applicable. No additional management support is needed unless otherwise documented below in the visit note. 

## 2014-08-25 NOTE — Patient Instructions (Signed)
DASH Eating Plan DASH stands for "Dietary Approaches to Stop Hypertension." The DASH eating plan is a healthy eating plan that has been shown to reduce high blood pressure (hypertension). Additional health benefits may include reducing the risk of type 2 diabetes mellitus, heart disease, and stroke. The DASH eating plan may also help with weight loss. WHAT DO I NEED TO KNOW ABOUT THE DASH EATING PLAN? For the DASH eating plan, you will follow these general guidelines:  Choose foods with a percent daily value for sodium of less than 5% (as listed on the food label).  Use salt-free seasonings or herbs instead of table salt or sea salt.  Check with your health care provider or pharmacist before using salt substitutes.  Eat lower-sodium products, often labeled as "lower sodium" or "no salt added."  Eat fresh foods.  Eat more vegetables, fruits, and low-fat dairy products.  Choose whole grains. Look for the word "whole" as the first word in the ingredient list.  Choose fish and skinless chicken or turkey more often than red meat. Limit fish, poultry, and meat to 6 oz (170 g) each day.  Limit sweets, desserts, sugars, and sugary drinks.  Choose heart-healthy fats.  Limit cheese to 1 oz (28 g) per day.  Eat more home-cooked food and less restaurant, buffet, and fast food.  Limit fried foods.  Cook foods using methods other than frying.  Limit canned vegetables. If you do use them, rinse them well to decrease the sodium.  When eating at a restaurant, ask that your food be prepared with less salt, or no salt if possible. WHAT FOODS CAN I EAT? Seek help from a dietitian for individual calorie needs. Grains Whole grain or whole wheat bread. Brown rice. Whole grain or whole wheat pasta. Quinoa, bulgur, and whole grain cereals. Low-sodium cereals. Corn or whole wheat flour tortillas. Whole grain cornbread. Whole grain crackers. Low-sodium crackers. Vegetables Fresh or frozen vegetables  (raw, steamed, roasted, or grilled). Low-sodium or reduced-sodium tomato and vegetable juices. Low-sodium or reduced-sodium tomato sauce and paste. Low-sodium or reduced-sodium canned vegetables.  Fruits All fresh, canned (in natural juice), or frozen fruits. Meat and Other Protein Products Ground beef (85% or leaner), grass-fed beef, or beef trimmed of fat. Skinless chicken or turkey. Ground chicken or turkey. Pork trimmed of fat. All fish and seafood. Eggs. Dried beans, peas, or lentils. Unsalted nuts and seeds. Unsalted canned beans. Dairy Low-fat dairy products, such as skim or 1% milk, 2% or reduced-fat cheeses, low-fat ricotta or cottage cheese, or plain low-fat yogurt. Low-sodium or reduced-sodium cheeses. Fats and Oils Tub margarines without trans fats. Light or reduced-fat mayonnaise and salad dressings (reduced sodium). Avocado. Safflower, olive, or canola oils. Natural peanut or almond butter. Other Unsalted popcorn and pretzels. The items listed above may not be a complete list of recommended foods or beverages. Contact your dietitian for more options. WHAT FOODS ARE NOT RECOMMENDED? Grains White bread. White pasta. White rice. Refined cornbread. Bagels and croissants. Crackers that contain trans fat. Vegetables Creamed or fried vegetables. Vegetables in a cheese sauce. Regular canned vegetables. Regular canned tomato sauce and paste. Regular tomato and vegetable juices. Fruits Dried fruits. Canned fruit in light or heavy syrup. Fruit juice. Meat and Other Protein Products Fatty cuts of meat. Ribs, chicken wings, bacon, sausage, bologna, salami, chitterlings, fatback, hot dogs, bratwurst, and packaged luncheon meats. Salted nuts and seeds. Canned beans with salt. Dairy Whole or 2% milk, cream, half-and-half, and cream cheese. Whole-fat or sweetened yogurt. Full-fat   cheeses or blue cheese. Nondairy creamers and whipped toppings. Processed cheese, cheese spreads, or cheese  curds. Condiments Onion and garlic salt, seasoned salt, table salt, and sea salt. Canned and packaged gravies. Worcestershire sauce. Tartar sauce. Barbecue sauce. Teriyaki sauce. Soy sauce, including reduced sodium. Steak sauce. Fish sauce. Oyster sauce. Cocktail sauce. Horseradish. Ketchup and mustard. Meat flavorings and tenderizers. Bouillon cubes. Hot sauce. Tabasco sauce. Marinades. Taco seasonings. Relishes. Fats and Oils Butter, stick margarine, lard, shortening, ghee, and bacon fat. Coconut, palm kernel, or palm oils. Regular salad dressings. Other Pickles and olives. Salted popcorn and pretzels. The items listed above may not be a complete list of foods and beverages to avoid. Contact your dietitian for more information. WHERE CAN I FIND MORE INFORMATION? National Heart, Lung, and Blood Institute: www.nhlbi.nih.gov/health/health-topics/topics/dash/ Document Released: 04/13/2011 Document Revised: 09/08/2013 Document Reviewed: 02/26/2013 ExitCare Patient Information 2015 [employer]. This information is not intended to replace advice given to you by your health care provider. Make sure you discuss any questions you have with your health care provider. Exercise to Lose Weight Exercise and a healthy diet may help you lose weight. Your doctor may suggest specific exercises. EXERCISE IDEAS AND TIPS  Choose low-cost things you enjoy doing, such as walking, bicycling, or exercising to workout videos.  Take stairs instead of the elevator.  Walk during your lunch break.  Park your car further away from work or school.  Go to a gym or an exercise class.  Start with 5 to 10 minutes of exercise each day. Build up to 30 minutes of exercise 4 to 6 days a week.  Wear shoes with good support and comfortable clothes.  Stretch before and after working out.  Work out until you breathe harder and your heart beats faster.  Drink extra water when you exercise.  Do not do so much that you  hurt yourself, feel dizzy, or get very short of breath. Exercises that burn about 150 calories:  Running 1  miles in 15 minutes.  Playing volleyball for 45 to 60 minutes.  Washing and waxing a car for 45 to 60 minutes.  Playing touch football for 45 minutes.  Walking 1  miles in 35 minutes.  Pushing a stroller 1  miles in 30 minutes.  Playing basketball for 30 minutes.  Raking leaves for 30 minutes.  Bicycling 5 miles in 30 minutes.  Walking 2 miles in 30 minutes.  Dancing for 30 minutes.  Shoveling snow for 15 minutes.  Swimming laps for 20 minutes.  Walking up stairs for 15 minutes.  Bicycling 4 miles in 15 minutes.  Gardening for 30 to 45 minutes.  Jumping rope for 15 minutes.  Washing windows or floors for 45 to 60 minutes. Document Released: 05/27/2010 Document Revised: 07/17/2011 Document Reviewed: 05/27/2010 ExitCare Patient Information 2015 [employer]. This information is not intended to replace advice given to you by your health care provider. Make sure you discuss any questions you have with your health care provider.  

## 2014-08-25 NOTE — Assessment & Plan Note (Signed)
Will recheck levels Mild trouble on cognitive tests this year---probably not related

## 2014-08-26 ENCOUNTER — Encounter: Payer: Self-pay | Admitting: *Deleted

## 2014-09-22 DIAGNOSIS — H524 Presbyopia: Secondary | ICD-10-CM | POA: Diagnosis not present

## 2014-12-21 ENCOUNTER — Other Ambulatory Visit: Payer: Self-pay

## 2014-12-21 MED ORDER — CIMETIDINE 400 MG PO TABS: 400.0000 mg | ORAL_TABLET | Freq: Two times a day (BID) | ORAL | Status: DC | PRN

## 2015-03-17 ENCOUNTER — Encounter: Payer: Self-pay | Admitting: Cardiology

## 2015-03-17 ENCOUNTER — Other Ambulatory Visit: Payer: Self-pay | Admitting: Cardiology

## 2015-03-17 ENCOUNTER — Ambulatory Visit (INDEPENDENT_AMBULATORY_CARE_PROVIDER_SITE_OTHER): Payer: Medicare PPO | Admitting: Cardiology

## 2015-03-17 VITALS — HR 84 | Ht 70.5 in | Wt 230.6 lb

## 2015-03-17 DIAGNOSIS — I739 Peripheral vascular disease, unspecified: Secondary | ICD-10-CM

## 2015-03-17 DIAGNOSIS — Z136 Encounter for screening for cardiovascular disorders: Secondary | ICD-10-CM

## 2015-03-17 DIAGNOSIS — I2510 Atherosclerotic heart disease of native coronary artery without angina pectoris: Secondary | ICD-10-CM | POA: Diagnosis not present

## 2015-03-17 DIAGNOSIS — I6523 Occlusion and stenosis of bilateral carotid arteries: Secondary | ICD-10-CM | POA: Diagnosis not present

## 2015-03-17 NOTE — Progress Notes (Signed)
Patient ID: ZANDON PINELL, male   DOB: [birth year], 69 y.o.   MRN: [id number]     Clinical Summary Mr. Scholtes is a 69 y.o.male seen today for follow up of the following medical problems.   1. CAD  - prior PTCA to RCA in 1993  - denies any chest pain or SOB or DOE - compliant with meds  2. Carotid stenosis  - moderate disease on most recent imaging 08/2013 - denies any neurological symptoms   3. Hyperlipidemia  - compliant with statin. - last panel 08/2014 TC 79 HDL 47 LDL 18 TG 64  4. Claudication bilateral cramping pain in calves with walking at < 1 block. No pain at rest. No sores on feet. Not improved with pletal after increasing to 100mg  bid, he asked to stop this medication and we did - ABIs L 0.59 and R 0.68. Waveforms suggestive of inflow disease. .   - notes symptoms are overall stable since last visit  5. HTN  - checks bp at home daily, typically 130s/70s  - compliant with meds  Past Medical History  Diagnosis Date  . Diverticulosis 2012    found on colonoscopy  . Colon polyps 3/12    single 2mm polyp--tubular adenoma  . GERD (gastroesophageal reflux disease)   . Arteriosclerotic cardiovascular disease (ASCVD) 1993    Critical RCA disease in 1993 treated with PTCA  . Hyperlipidemia   . Erectile dysfunction   . Osteoarthritis     knees/hands-Dr Sherlean Foot  . Cerebrovascular disease     Moderate ASVD without focal stenosis in 01/2007  . Tobacco abuse, in remission     30-40 pack years discontinued in 1993  . Overweight(278.02)   . Rosacea   . Hematochezia 05/24/2010  . Peripheral vascular disease 03/11/2010    Moderate SFA stenosis; history of claudication  . Vitamin B12 deficiency      Allergies  Allergen Reactions  . Diltiazem Hcl      Current Outpatient Prescriptions  Medication Sig Dispense Refill  . aspirin 81 MG tablet Take 81 mg by mouth daily.      . chlorthalidone (HYGROTON) 25 MG tablet Take 0.5 tablets (12.5 mg total) by mouth daily.  135 tablet 3  . cimetidine (TAGAMET) 400 MG tablet Take 1 tablet (400 mg total) by mouth 2 (two) times daily as needed. 60 tablet 3  . losartan (COZAAR) 25 MG tablet TAKE 1 TABLET (25 MG TOTAL) BY MOUTH DAILY. 90 tablet 3  . metoprolol succinate (TOPROL-XL) 100 MG 24 hr tablet TAKE ONE TABLET BY MOUTH EVERY DAY 90 tablet 3  . nitroGLYCERIN (NITROSTAT) 0.4 MG SL tablet Place 1 tablet (0.4 mg total) under the tongue every 5 (five) minutes as needed for chest pain. 90 tablet 3  . Omega-3 Fatty Acids (FISH OIL PO) Take by mouth.      . pravastatin (PRAVACHOL) 20 MG tablet Take 1 tablet (20 mg total) by mouth daily. 90 tablet 3  . vitamin B-12 (CYANOCOBALAMIN) 1000 MCG tablet Take 1,000 mcg by mouth daily.      [name] zoster vaccine live, PF, (ZOSTAVAX) 29528 UNT/0.65ML injection Inject 19,400 Units into the skin once. 1 each 0   No current facility-administered medications for this visit.     Past Surgical History  Procedure Laterality Date  . Appendectomy    . Colonoscopy w/ polypectomy  2012  . Rotator cuff repair Left 7/15    Dr Sherlean Foot     Allergies  Allergen Reactions  . Diltiazem  Hcl       Family History  Problem Relation Age of Onset  . Hypertension Father     And siblings  . Heart disease Father     And second-degree relatives  . Transient ischemic attack Father   . Lung cancer Mother   . Cancer Sister     brain cancer     Social History Mr. Donaghey reports that he has quit smoking. He has never used smokeless tobacco. Mr. Alioto reports that he does not drink alcohol.   Review of Systems CONSTITUTIONAL: No weight loss, fever, chills, weakness or fatigue.  HEENT: Eyes: No visual loss, blurred vision, double vision or yellow sclerae.No hearing loss, sneezing, congestion, runny nose or sore throat.  SKIN: No rash or itching.  CARDIOVASCULAR: per HPI RESPIRATORY: No shortness of breath, cough or sputum.  GASTROINTESTINAL: No anorexia, nausea, vomiting or diarrhea. No  abdominal pain or blood.  GENITOURINARY: No burning on urination, no polyuria NEUROLOGICAL: No headache, dizziness, syncope, paralysis, ataxia, numbness or tingling in the extremities. No change in bowel or bladder control.  MUSCULOSKELETAL: per HPI LYMPHATICS: No enlarged nodes. No history of splenectomy.  PSYCHIATRIC: No history of depression or anxiety.  ENDOCRINOLOGIC: No reports of sweating, cold or heat intolerance. No polyuria or polydipsia.  [name]   Physical Examination Filed Vitals:   03/17/15 1407  Pulse: 84   Filed Vitals:   03/17/15 1407  Height: 5' 10.5" (1.791 m)  Weight: 230 lb 9.6 oz (104.599 kg)    Gen: resting comfortably, no acute distress HEENT: no scleral icterus, pupils equal round and reactive, no palptable cervical adenopathy,  CV: RRR, no m/r/g, no jvd Resp: Clear to auscultation bilaterally GI: abdomen is soft, non-tender, non-distended, normal bowel sounds, no hepatosplenomegaly MSK: extremities are warm, no edema.  Skin: warm, no rash Neuro:  no focal deficits Psych: appropriate affect   Diagnostic Studies  08/2011 Carotid US:  IMPRESSION: No significant change in the bilateral carotid atherosclerosis.  Right ICA narrowing less than 50%.  Moderate left ICA narrowing, 50-69%.  Pertinent labs  08/2012: TC 84 TG 71 HDL 40 LDL 29 Na 135 BUN 25 Cr 1.1 Hgb 12.9 Hct 39.3 Plt 280 AST 23 ALT 31 TSH 0.51   03/14/13 Clinic EKG: sinus rhythm, normal axis, no ischemic changes   03/2009 ABI  IMPRESSION: Similar pattern of arterial occlusive disease in both lower extremities. Ankle-brachial indices are moderately depressed at rest with segmental evaluation consistent with bilateral SFA occlusive disease and potential component of proximal inflow disease. Further anatomic delineation would be helpful with a study such as CT angiography to determine exact nature of occlusive disease.   08/2013 ABI FINDINGS:  Right ABI: 0.68  Left ABI: 0.59   Right Lower Extremity: Significantly decreased pressure between the  right brachial pressure and right upper thigh pressure suggests  significant inflow disease. No focal pressure drop between the upper  and lower thigh, or below the knee. Moderately abnormal PVRs from  the thigh to the metatarsals consistent with inflow disease.  Left Lower Extremity: Significantly decreased pressure between the  left brachial pressure and left upper thigh consistent with inflow  disease. No significant pressure drop between the upper thigh and  lower thigh or below the knee. Moderately abnormal PVRs throughout  the left lower extremity consistent with inflow disease.  IMPRESSION:  1. Abnormal bilateral ankle-brachial indices consistent with  moderate peripheral arterial disease which is slightly improved  compared to 03/15/2009. Query interval history of SFA intervention,  or successful exercise program?  2. Based on today's evaluation, the level of disease appears to be  inflow (aortoiliac) bilaterally.  Signed,  Sterling Big, MD  [facility]  [facility]   08/2013 Carotid US IMPRESSION:  1. Interval progression of mild right ICA stenosis from less than  50% to an estimated 50- 69%.  2. No significant interval change in moderate (50-69%) left ICA  narrowing.  3. Vertebral arteries are patent with normal antegrade flow.  Signed,       Assessment and Plan  1. CAD  - no current symptoms  - continue risk factor modification and secondary prevention  2. Carotid stenosis  - no current symptoms  - will repeat carotid US  3. Hyperlipidemia  - at goal on current statin, continue current medication. Very low LDL, have not changed to high dose statin.   4. Claudication  - recent ABI with moderate bilateral disease, waveforms suggesting of inflow disease - not improved with cilostazol 100mg  bid, patient  asked to stop this medication last visit - symptoms stable. WIll continue to monitor. Reducated on walking program for claudicaoitn.   5. HTN  - at goal, conitnue current meds  6. Tobacco history - male over the age of 21 with previous tobacco history. Will order AAA screen.    F/u 1 year Antoine Poche, M.D.

## 2015-03-17 NOTE — Patient Instructions (Addendum)
Your physician wants you to follow-up in: 1 year with Dr Bryna Colander will receive a reminder letter in the mail two months in advance. If you don't receive a letter, please call our office to schedule the follow-up appointment.    Your physician recommends that you continue on your current medications as directed. Please refer to the Current Medication list given to you today.    If you need a refill on your cardiac medications before your next appointment, please call your pharmacy.    Your physician has requested that you have an abdominal aorta duplex. During this test, an ultrasound is used to evaluate the aorta. Allow 30 minutes for this exam. Do not eat after midnight the day before and avoid carbonated beverages   Your physician has requested that you have a carotid duplex. This test is an ultrasound of the carotid arteries in your neck. It looks at blood flow through these arteries that supply the brain with blood. Allow one hour for this exam. There are no restrictions or special instructions.     Thank you for choosing [facility] !

## 2015-03-22 ENCOUNTER — Ambulatory Visit (HOSPITAL_COMMUNITY)
Admission: RE | Admit: 2015-03-22 | Discharge: 2015-03-22 | Disposition: A | Discharge status: Home / Self Care | Payer: Medicare PPO | Source: Ambulatory Visit | Attending: Cardiology | Admitting: Cardiology

## 2015-03-22 DIAGNOSIS — I77811 Abdominal aortic ectasia: Secondary | ICD-10-CM | POA: Diagnosis not present

## 2015-03-22 DIAGNOSIS — Z1389 Encounter for screening for other disorder: Secondary | ICD-10-CM | POA: Diagnosis not present

## 2015-03-22 DIAGNOSIS — I10 Essential (primary) hypertension: Secondary | ICD-10-CM | POA: Insufficient documentation

## 2015-03-22 DIAGNOSIS — Z136 Encounter for screening for cardiovascular disorders: Secondary | ICD-10-CM

## 2015-03-22 DIAGNOSIS — I2510 Atherosclerotic heart disease of native coronary artery without angina pectoris: Secondary | ICD-10-CM | POA: Insufficient documentation

## 2015-03-22 DIAGNOSIS — I6522 Occlusion and stenosis of left carotid artery: Secondary | ICD-10-CM | POA: Insufficient documentation

## 2015-03-22 DIAGNOSIS — I6523 Occlusion and stenosis of bilateral carotid arteries: Secondary | ICD-10-CM

## 2015-05-21 ENCOUNTER — Telehealth: Payer: Self-pay | Admitting: Cardiology

## 2015-05-21 NOTE — Telephone Encounter (Signed)
FYI: Patient called office requesting appointment with Dr.Branch for "leg pain". Advised patient that Dr. Nelly Laurence next appointment in [facility] office was not until 07/07/15.  Offered patient appointment w/ extender and he refused. Also offered patient appointment in the [place] office and he refused. Advised patient that for "leg pain" he could also follow with PCP. He stated that he would call back if he needed to. / tg

## 2015-05-24 ENCOUNTER — Ambulatory Visit (INDEPENDENT_AMBULATORY_CARE_PROVIDER_SITE_OTHER): Payer: Medicare Other | Admitting: Adult Health

## 2015-05-24 ENCOUNTER — Encounter: Payer: Self-pay | Admitting: Adult Health

## 2015-05-24 VITALS — BP 138/58 | HR 84 | Ht 70.5 in | Wt 228.0 lb

## 2015-05-24 DIAGNOSIS — I2510 Atherosclerotic heart disease of native coronary artery without angina pectoris: Secondary | ICD-10-CM

## 2015-05-24 DIAGNOSIS — I739 Peripheral vascular disease, unspecified: Secondary | ICD-10-CM | POA: Diagnosis not present

## 2015-05-24 DIAGNOSIS — E7800 Pure hypercholesterolemia, unspecified: Secondary | ICD-10-CM | POA: Diagnosis not present

## 2015-05-24 NOTE — Progress Notes (Signed)
Cardiology Office Note   Date:  05/24/2015   ID:  Aaron Sexton, DOB [birth year], MRN [id number]  PCP:  Aaron Abide, MD  Cardiologist:  Aaron Calix, NP   Chief Complaint  Patient presents with  . Coronary Artery Disease  . PAD      History of Present Illness: Aaron Sexton is a 70 y.o. male who presents for ongoing assessment and management of CAD, with hx of PTCA to RCA, carotid artery stenosis, hyperlipidemia, and claudication. He comes today with worsening leg pain.   He has a hx of abnormal ABI from May of 2015  Right ABI: 0.68 Left ABI: 0.59 IMPRESSION: 1. Abnormal bilateral ankle-brachial indices consistent with moderate peripheral arterial disease which is slightly improved compared to 03/15/2009. Query interval history of SFA intervention, or successful exercise program? 2. Based on today's evaluation, the level of disease appears to be inflow (aortoiliac) bilaterally.   He was treated medically with Pletal but this did not help after 3 months of treatment.He comes today with worsening symptoms on the left, unable to bear weight due to pain, and worsening pain with walking. Some tingling but no numbness. He states the pain sometimes radiates into his left hip. He is now ready for referral to PVD clinic.   Past Medical History  Diagnosis Date  . Diverticulosis 2012    found on colonoscopy  . Colon polyps 3/12    single 2mm polyp--tubular adenoma  . GERD (gastroesophageal reflux disease)   . Arteriosclerotic cardiovascular disease (ASCVD) 1993    Critical RCA disease in 1993 treated with PTCA  . Hyperlipidemia   . Erectile dysfunction   . Osteoarthritis     knees/hands-Dr Aaron Sexton  . Cerebrovascular disease     Moderate ASVD without focal stenosis in 01/2007  . Tobacco abuse, in remission     30-40 pack years discontinued in 1993  . Overweight(278.02)   . Rosacea   . Hematochezia 05/24/2010  . Peripheral vascular disease (HCC) 03/11/2010   Moderate SFA stenosis; history of claudication  . Vitamin B12 deficiency     Past Surgical History  Procedure Laterality Date  . Appendectomy    . Colonoscopy w/ polypectomy  2012  . Rotator cuff repair Left 7/15    Dr Aaron Sexton     Current Outpatient Prescriptions  Medication Sig Dispense Refill  . aspirin 81 MG tablet Take 81 mg by mouth daily.      . chlorthalidone (HYGROTON) 25 MG tablet Take 0.5 tablets (12.5 mg total) by mouth daily. 135 tablet 3  . cimetidine (TAGAMET) 400 MG tablet Take 1 tablet (400 mg total) by mouth 2 (two) times daily as needed. 60 tablet 3  . losartan (COZAAR) 25 MG tablet TAKE 1 TABLET (25 MG TOTAL) BY MOUTH DAILY. 90 tablet 3  . metoprolol succinate (TOPROL-XL) 100 MG 24 hr tablet TAKE ONE TABLET BY MOUTH EVERY DAY 90 tablet 3  . nitroGLYCERIN (NITROSTAT) 0.4 MG SL tablet Place 1 tablet (0.4 mg total) under the tongue every 5 (five) minutes as needed for chest pain. 90 tablet 3  . Omega-3 Fatty Acids (FISH OIL PO) Take by mouth.      . pravastatin (PRAVACHOL) 20 MG tablet Take 1 tablet (20 mg total) by mouth daily. 90 tablet 3  . vitamin B-12 (CYANOCOBALAMIN) 1000 MCG tablet Take 1,000 mcg by mouth daily.       No current facility-administered medications for this visit.    Allergies:   Diltiazem hcl  Social History:  The patient  reports that he quit smoking about 25 years ago. He has never used smokeless tobacco. He reports that he does not drink alcohol or use illicit drugs.   Family History:  The patient's family history includes Cancer in his sister; Heart disease in his father; Hypertension in his father; Lung cancer in his mother; Transient ischemic attack in his father.    ROS: All other systems are reviewed and negative. Unless otherwise mentioned in H&P    PHYSICAL EXAM: VS:  BP 138/58 mmHg  Pulse 84  Ht 5' 10.5" (1.791 m)  Wt 228 lb (103.42 kg)  BMI 32.24 kg/m2  SpO2 95% , BMI Body mass index is 32.24 kg/(m^2). GEN: Well  nourished, well developed, in no acute distress HEENT: normal Neck: no JVD, Right carotid bruit, non on the left.  Cardiac: RRR; 1/6 systolic murmur, , rubs, or gallops,no edema  Respiratory:  clear to auscultation bilaterally, normal work of breathing GI: soft, nontender, nondistended, + BS MS: no deformity or atrophyUnable to palpate pulses in either leg from popliteal distal. Bilateral femoral bruits are auscultated. Skin is warm to the touch. No erythema or edema.  Skin: warm and dry, no rash Neuro:  Strength and sensation are intact Psych: euthymic mood, full affect  Other Cardiac Studies 03/22/2015 Carotid Ultrasound Right: Heterogeneous plaque at the carotid bifurcation, with discordant results regarding degree of stenosis. Peak velocity suggests 50%- 69% stenosis, with the ICA/ CCA ratio suggesting a lesser degree of stenosis. If establishing a more accurate degree of stenosis is desired, cerebral angiogram should be considered, or as a second best test, CTA. Left: Heterogeneous plaque at the carotid bifurcation, with discordant results regarding degree of stenosis. Peak velocity suggests 50% -69% stenosis, with the ICA/ CCA ratio suggesting a lesser degree of stenosis. If establishing a more accurate degree of stenosis is desired, cerebral angiogram should be considered, or as a second best test, CTA.  Recent Labs: 08/25/2014: ALT 23; BUN 19; Creatinine, Ser 0.88; Hemoglobin 12.8*; Platelets 302.0; Potassium 4.1; Sodium 136    Lipid Panel    Component Value Date/Time   CHOL 79 08/25/2014 1039   TRIG 64.0 08/25/2014 1039   HDL 47.80 08/25/2014 1039   CHOLHDL 2 08/25/2014 1039   VLDL 12.8 08/25/2014 1039   LDLCALC 18 08/25/2014 1039      Wt Readings from Last 3 Encounters:  05/24/15 228 lb (103.42 kg)  03/17/15 230 lb 9.6 oz (104.599 kg)  08/25/14 229 lb (103.874 kg)     ASSESSMENT AND PLAN:  1. PAD: Abnormal ABI in May of 2015, with worsening claudication  symptoms on the left and significant pain with walking. No evidence of necrosis, edema or skin damage. Skin is warn. Femoral bruits are noted. I will refer him to Dr. Allyson Sabal for evaluation and need for arteriogram and possible intervention. I have discussed this with the patient and his wife who are happy to see Dr. Allyson Sabal, as he did PTCA on this patient in the past. In the interim, I have provided non-narcotic pain control with Tramadol 50 mg Q 8 hours. He will see Dr. Allyson Sabal this week.   2. CAD: S/P PTCA to RCA in 1990's. He is asymptomatic from cardiology standpoint. Will countinue ARB, Metoprolol and ASA.   3. Hypercholesterolemia: On Pravastatin.Recent labs in April of 2016 demonstrate good control.    Current medicines are reviewed at length with the patient today.    Labs/ tests ordered today include:  Orders Placed This Encounter  Procedures  . Ambulatory referral to Cardiology     Disposition:   FU with after seen and recommendations per Dr. Allyson Sabal.   Signed, [name], NP  05/24/2015 2:45 PM    [facility] [address], [place], [place] [address] Phone: [phone]; Fax: [phone]

## 2015-05-24 NOTE — Patient Instructions (Signed)
Your physician recommends that you schedule a follow-up appointment in: as directed  You have been referred to Dr Gwenlyn Found at [facility] [phone]   Your physician recommends that you continue on your current medications as directed. Please refer to the Current Medication list given to you today.       Thank you for choosing [facility] !

## 2015-05-24 NOTE — Progress Notes (Signed)
Name: Aaron Sexton    DOB: 06-Nov-1945  Age: 70 y.o.  MR#: [id number]       PCP:  Tillman Abide, MD      Insurance: Payor: Cleatrice Burke MEDICARE / Plan: [facility] MEDICARE / Product Type: *No Product type* /   CC:    Chief Complaint  Patient presents with  . Coronary Artery Disease  . PAD    VS Filed Vitals:   05/24/15 1407  BP: 138/58  Pulse: 84  Height: 5' 10.5" (1.791 m)  Weight: 228 lb (103.42 kg)  SpO2: 95%    Weights Current Weight  05/24/15 228 lb (103.42 kg)  03/17/15 230 lb 9.6 oz (104.599 kg)  08/25/14 229 lb (103.874 kg)    Blood Pressure  BP Readings from Last 3 Encounters:  05/24/15 138/58  08/25/14 132/68  03/16/14 150/66     Admit date:  (Not on file) Last encounter with RMR:  Visit date not found   Allergy Diltiazem hcl  Current Outpatient Prescriptions  Medication Sig Dispense Refill  . aspirin 81 MG tablet Take 81 mg by mouth daily.      . chlorthalidone (HYGROTON) 25 MG tablet Take 0.5 tablets (12.5 mg total) by mouth daily. 135 tablet 3  . cimetidine (TAGAMET) 400 MG tablet Take 1 tablet (400 mg total) by mouth 2 (two) times daily as needed. 60 tablet 3  . losartan (COZAAR) 25 MG tablet TAKE 1 TABLET (25 MG TOTAL) BY MOUTH DAILY. 90 tablet 3  . metoprolol succinate (TOPROL-XL) 100 MG 24 hr tablet TAKE ONE TABLET BY MOUTH EVERY DAY 90 tablet 3  . nitroGLYCERIN (NITROSTAT) 0.4 MG SL tablet Place 1 tablet (0.4 mg total) under the tongue every 5 (five) minutes as needed for chest pain. 90 tablet 3  . Omega-3 Fatty Acids (FISH OIL PO) Take by mouth.      . pravastatin (PRAVACHOL) 20 MG tablet Take 1 tablet (20 mg total) by mouth daily. 90 tablet 3  . vitamin B-12 (CYANOCOBALAMIN) 1000 MCG tablet Take 1,000 mcg by mouth daily.       No current facility-administered medications for this visit.    Discontinued Meds:   There are no discontinued medications.  Patient Active Problem List   Diagnosis Date Noted  . Advance directive discussed with  patient 08/25/2014  . Anemia 08/19/2013  . Routine general medical examination at a health care facility 08/16/2012  . Adenomatous colon polyp   . Vitamin B12 deficiency   . Arteriosclerotic cardiovascular disease (ASCVD)   . Cerebrovascular disease   . Peripheral vascular disease (HCC) 03/11/2010  . Obesity, unspecified 03/08/2009  . OSTEOARTHRITIS 02/21/2008  . HYPERLIPIDEMIA 12/07/2006  . Gastroesophageal reflux disease 11/30/2006    LABS    Component Value Date/Time   NA 136 08/25/2014 1039   NA 141 09/30/2013 0910   NA 135 08/19/2013 1047   K 4.1 08/25/2014 1039   K 4.2 09/30/2013 0910   K 3.9 08/19/2013 1047   CL 102 08/25/2014 1039   CL 109 09/30/2013 0910   CL 100 08/19/2013 1047   CO2 29 08/25/2014 1039   CO2 26 09/30/2013 0910   CO2 27 08/19/2013 1047   GLUCOSE 105* 08/25/2014 1039   GLUCOSE 108* 09/30/2013 0910   GLUCOSE 98 08/19/2013 1047   BUN 19 08/25/2014 1039   BUN 19 09/30/2013 0910   BUN 23 08/19/2013 1047   CREATININE 0.88 08/25/2014 1039   CREATININE 1.2 09/30/2013 0910   CREATININE 0.9 08/19/2013 1047  CALCIUM 9.7 08/25/2014 1039   CALCIUM 9.1 09/30/2013 0910   CALCIUM 9.2 08/19/2013 1047   GFRNONAA 102.76 05/06/2010 0914   GFRNONAA 79.68 05/04/2009 1028   GFRNONAA 80 02/21/2008 1024   GFRAA 97 02/21/2008 1024   GFRAA 110 12/07/2006 0918   CMP     Component Value Date/Time   NA 136 08/25/2014 1039   K 4.1 08/25/2014 1039   CL 102 08/25/2014 1039   CO2 29 08/25/2014 1039   GLUCOSE 105* 08/25/2014 1039   BUN 19 08/25/2014 1039   CREATININE 0.88 08/25/2014 1039   CALCIUM 9.7 08/25/2014 1039   PROT 7.8 08/25/2014 1039   ALBUMIN 4.4 08/25/2014 1039   AST 18 08/25/2014 1039   ALT 23 08/25/2014 1039   ALKPHOS 42 08/25/2014 1039   BILITOT 0.5 08/25/2014 1039   GFRNONAA 102.76 05/06/2010 0914   GFRAA 97 02/21/2008 1024       Component Value Date/Time   WBC 9.4 08/25/2014 1039   WBC 9.6 08/19/2013 1047   WBC 8.6 08/16/2012 0916    HGB 12.8* 08/25/2014 1039   HGB 12.9* 08/19/2013 1047   HGB 12.9* 08/16/2012 0916   HCT 38.5* 08/25/2014 1039   HCT 39.7 08/19/2013 1047   HCT 39.3 08/16/2012 0916   MCV 111.2 Repeated and verified X2.* 08/25/2014 1039   MCV 114.5 Repeated and verified X2.* 08/19/2013 1047   MCV 111.9 Repeated and verified X2.* 08/16/2012 0916    Lipid Panel     Component Value Date/Time   CHOL 79 08/25/2014 1039   TRIG 64.0 08/25/2014 1039   HDL 47.80 08/25/2014 1039   CHOLHDL 2 08/25/2014 1039   VLDL 12.8 08/25/2014 1039   LDLCALC 18 08/25/2014 1039    ABG No results found for: PHART, PCO2ART, PO2ART, HCO3, TCO2, ACIDBASEDEF, O2SAT   Lab Results  Component Value Date   TSH 0.43 08/19/2013   BNP (last 3 results) No results for input(s): BNP in the last 8760 hours.  ProBNP (last 3 results) No results for input(s): PROBNP in the last 8760 hours.  Cardiac Panel (last 3 results) No results for input(s): CKTOTAL, CKMB, TROPONINI, RELINDX in the last 72 hours.  Iron/TIBC/Ferritin/ %Sat No results found for: IRON, TIBC, FERRITIN, IRONPCTSAT   EKG Orders placed or performed in visit on 03/17/15  . EKG 12-Lead     Prior Assessment and Plan Problem List as of 05/24/2015      Cardiovascular and Mediastinum   Peripheral vascular disease [facility])   Last Assessment & Plan 08/25/2014 Office Visit Written 08/25/2014 10:23 AM by Karie Schwalbe, MD    Stable claudication Discussed increasing exercise to promote collaterals      Arteriosclerotic cardiovascular disease (ASCVD)   Last Assessment & Plan 08/25/2014 Office Visit Written 08/25/2014 10:23 AM by Karie Schwalbe, MD    No angina On appropriate meds      Cerebrovascular disease   Last Assessment & Plan 08/19/2013 Office Visit Written 08/19/2013 10:33 AM by Karie Schwalbe, MD    Carotids are monitored        Digestive   Gastroesophageal reflux disease   Last Assessment & Plan 08/16/2012 Office Visit Written 08/16/2012  9:07 AM by  Karie Schwalbe, MD    Rarely needs the tagamet      Adenomatous colon polyp   Vitamin B12 deficiency   Last Assessment & Plan 08/25/2014 Office Visit Written 08/25/2014 10:24 AM by Karie Schwalbe, MD    Will recheck levels Mild trouble on  cognitive tests this year---probably not related        Musculoskeletal and Integument   OSTEOARTHRITIS   Last Assessment & Plan 08/19/2013 Office Visit Written 08/19/2013 10:35 AM by Karie Schwalbe, MD    On tylenol        Other   HYPERLIPIDEMIA   Last Assessment & Plan 08/19/2013 Office Visit Written 08/19/2013 10:34 AM by Karie Schwalbe, MD    No problems with statin Will recheck      Obesity, unspecified   Last Assessment & Plan 08/16/2012 Office Visit Written 08/16/2012  9:07 AM by Karie Schwalbe, MD    Will give DASH diet info      Routine general medical examination at a health care facility   Last Assessment & Plan 08/25/2014 Office Visit Written 08/25/2014 10:22 AM by Karie Schwalbe, MD    I have personally reviewed the Medicare Annual Wellness questionnaire and have noted 1. The patient's medical and social history 2. Their use of alcohol, tobacco or illicit drugs 3. Their current medications and supplements 4. The patient's functional ability including ADL's, fall risks, home safety risks and hearing or visual             impairment. 5. Diet and physical activities 6. Evidence for depression or mood disorders  The patients weight, height, BMI and visual acuity have been recorded in the chart I have made referrals, counseling and provided education to the patient based review of the above and I have provided the pt with a written personalized care plan for preventive services.  I have provided you with a copy of your personalized plan for preventive services. Please take the time to review along with your updated medication list.  Rx for zostavax No PSA due to age Due for colonoscopy next year--will set up with  [facility] Discussed lifestyle      Anemia   Last Assessment & Plan 08/25/2014 Office Visit Written 08/25/2014 10:24 AM by Karie Schwalbe, MD    Mild Will recheck this year      Advance directive discussed with patient   Last Assessment & Plan 08/25/2014 Office Visit Written 08/25/2014 10:25 AM by Karie Schwalbe, MD    See social history          Imaging: No results found.

## 2015-05-28 ENCOUNTER — Ambulatory Visit
Admission: RE | Admit: 2015-05-28 | Discharge: 2015-05-28 | Disposition: A | Discharge status: Home / Self Care | Payer: Medicare Other | Source: Ambulatory Visit | Attending: Cardiovascular Disease | Admitting: Cardiovascular Disease

## 2015-05-28 ENCOUNTER — Encounter: Payer: Self-pay | Admitting: Cardiovascular Disease

## 2015-05-28 ENCOUNTER — Ambulatory Visit (INDEPENDENT_AMBULATORY_CARE_PROVIDER_SITE_OTHER): Payer: Medicare Other | Admitting: Cardiovascular Disease

## 2015-05-28 DIAGNOSIS — I739 Peripheral vascular disease, unspecified: Secondary | ICD-10-CM | POA: Diagnosis not present

## 2015-05-28 NOTE — Assessment & Plan Note (Signed)
Aaron Sexton was referred to me by Jory Sims nurse practitioner for evaluation of peripheral arterial disease. He has a history of coronary artery disease status post angioplasty of his RCA by myself in 1994. He had Dopplers performed 09/11/13 revealing a right ABI 0.68 and left upper 59 with both SFA and inflow disease. He does complain of left eye limiting claudication left greater than right. I'm going to repeat his lower extremity or till Doppler studies and arrange for him to undergo angiography and potential endovascular therapy.

## 2015-05-28 NOTE — Progress Notes (Signed)
05/28/2015 Aaron Sexton   April 04, 1946  [id number]  Primary Physician Tillman Abide, MD Primary Cardiologist: Runell Gess MD [name]   HPI:   Aaron Sexton is a 70 year old mildly overweight married Caucasian male father of 3, grandfather and 3 grandchildren referred by [name] registered nurse practitioner for evaluation of peripheral arterial disease. His cardiologist is Dr. Dina Rich. He has a history of hypertension and hyperlipidemia. I performed angioplasty of his right coronary artery back in 1994 and he's been a symptomatically since. He stopped smoking at that time. 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 He complains of worsening left greater than right lower extremity claudication. Dopplers performed at [facility] 09/11/13 revealed a right ABI 0.68 and a left ABI 0.59.   Current Outpatient Prescriptions  Medication Sig Dispense Refill  . aspirin 81 MG tablet Take 81 mg by mouth daily.      . chlorthalidone (HYGROTON) 25 MG tablet Take 0.5 tablets (12.5 mg total) by mouth daily. 135 tablet 3  . cimetidine (TAGAMET) 400 MG tablet Take 1 tablet (400 mg total) by mouth 2 (two) times daily as needed. 60 tablet 3  . losartan (COZAAR) 25 MG tablet TAKE 1 TABLET (25 MG TOTAL) BY MOUTH DAILY. 90 tablet 3  . metoprolol succinate (TOPROL-XL) 100 MG 24 hr tablet TAKE ONE TABLET BY MOUTH EVERY DAY 90 tablet 3  . nitroGLYCERIN (NITROSTAT) 0.4 MG SL tablet Place 1 tablet (0.4 mg total) under the tongue every 5 (five) minutes as needed for chest pain. 90 tablet 3  . Omega-3 Fatty Acids (FISH OIL PO) Take by mouth.      . pravastatin (PRAVACHOL) 20 MG tablet Take 1 tablet (20 mg total) by mouth daily. 90 tablet 3  . traMADol (ULTRAM) 50 MG tablet Take 50 mg by mouth every 8 (eight) hours as needed.  1  . vitamin B-12 (CYANOCOBALAMIN) 1000 MCG tablet Take 1,000 mcg by mouth daily.       No current facility-administered  medications for this visit.    Allergies  Allergen Reactions  . Diltiazem Hcl     Social History   Social History  . Marital Status: Married    Spouse Name: N/A  . Number of Children: N/A  . Years of Education: N/A   Occupational History  . 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 Smoking status: Former Smoker -- 1.00 packs/day for 30 years    Quit date: 05/16/1990  . Smokeless tobacco: Never Used     Comment: Quit in 1993  . Alcohol Use: No  . Drug Use: No  . Sexual Activity: Not on file   Other Topics Concern  . Not on file   Social History Narrative   Has living will   Wife is health care POA   Would accept resuscitation but no prolonged artificial ventilation.   No tube feeds if cognitively unaware     Review of Systems: General: negative for chills, fever, night sweats or weight changes.  Cardiovascular: negative for chest pain, dyspnea on exertion, edema, orthopnea, palpitations, paroxysmal nocturnal dyspnea or shortness of breath Dermatological: negative for rash Respiratory: negative for cough or wheezing Urologic: negative for hematuria Abdominal: negative for nausea, vomiting, diarrhea, bright red blood per rectum, melena, or hematemesis Neurologic: negative for visual changes, syncope, or dizziness All other systems reviewed and are otherwise  negative except as noted above.    Blood pressure 130/68, pulse 66, height 5\' 10"  (1.778 m), weight 230 lb 3.2 oz (104.418 kg).  General appearance: alert and no distress Neck: no adenopathy, no JVD, supple, symmetrical, trachea midline, thyroid not enlarged, symmetric, no tenderness/mass/nodules and right carotid bruit Lungs: clear to auscultation bilaterally Heart: regular rate and rhythm, S1, S2 normal, no murmur, click, rub or gallop Extremities: extremities normal, atraumatic, no cyanosis or edema and absent pedal pulses  EKG not performed  today  ASSESSMENT AND PLAN:   Peripheral vascular disease Aaron Sexton was referred to me by [name] nurse practitioner for evaluation of peripheral arterial disease. He has a history of coronary artery disease status post angioplasty of his RCA by myself in 1994. He had Dopplers performed 09/11/13 revealing a right ABI 0.68 and left upper 59 with both SFA and inflow disease. He does complain of left eye limiting claudication left greater than right. I'm going to repeat his lower extremity or till Doppler studies and arrange for him to undergo angiography and potential endovascular therapy.      Runell Gess MD FACP,FACC,FAHA, [facility] 05/28/2015 10:47 AM

## 2015-05-28 NOTE — Patient Instructions (Signed)
Medication Instructions:  Your physician recommends that you continue on your current medications as directed. Please refer to the Current Medication list given to you today.   Labwork: Your physician recommends that you return for lab work in: Within 7 days of procedure The lab can be found on the FIRST FLOOR of out building in [address]   Testing/Procedures: Dr. Gwenlyn Found has ordered a peripheral angiogram to be done at [facility].  This procedure is going to look at the bloodflow in your lower extremities.  If Dr. Gwenlyn Found is able to open up the arteries, you will have to spend one night in the hospital.  If he is not able to open the arteries, you will be able to go home that same day.  SCHEDULED ON 06/21/15  After the procedure, you will not be allowed to drive for 3 days or push, pull, or lift anything greater than 10 lbs for one week.    You will be required to have the following tests prior to the procedure:  1. Blood work-the blood work can be done no more than 7 days prior to the procedure.  It can be done at any [facility] lab.  There is one downstairs on the first floor of this building and one in the [facility] building [phone] [address], [address])  2. Chest Xray-the chest xray order has already been placed at the [facility].   ([place])  *REPS: Scott  Puncture site : Right Groin      Any Other Special Instructions Will Be Listed Below (If Applicable).     If you need a refill on your cardiac medications before your next appointment, please call your pharmacy.

## 2015-05-30 ENCOUNTER — Other Ambulatory Visit: Payer: Self-pay

## 2015-05-30 DIAGNOSIS — I739 Peripheral vascular disease, unspecified: Secondary | ICD-10-CM

## 2015-05-31 ENCOUNTER — Telehealth: Payer: Self-pay | Admitting: Cardiovascular Disease

## 2015-05-31 NOTE — Telephone Encounter (Signed)
Routed to Dr. Harl Bowie as he is pt primary cardiologist.

## 2015-05-31 NOTE — Telephone Encounter (Signed)
1. What dental office are you calling from? (wife called in) [facility]   2. What is your office phone and fax number?  She did not know   3. What type of procedure is the patient having performed? Teeth Cleaning   4. What date is procedure scheduled? 2/8  5. What is your question (ex. Antibiotics prior to procedure, holding medication-we need to know how long dentist wants pt to hold med)? Is pt ok to have this procedure done.  6.

## 2015-06-01 ENCOUNTER — Other Ambulatory Visit: Payer: Self-pay | Admitting: Cardiovascular Disease

## 2015-06-01 DIAGNOSIS — I739 Peripheral vascular disease, unspecified: Secondary | ICD-10-CM

## 2015-06-01 NOTE — Telephone Encounter (Signed)
Spoke with pt, aware okay for dental procedure.

## 2015-06-01 NOTE — Telephone Encounter (Signed)
Dental cleaning is fine. Does not need antibiotics or to hold any meds

## 2015-06-02 MED ORDER — PRAVASTATIN SODIUM 20 MG PO TABS: 20.0000 mg | ORAL_TABLET | Freq: Every day | ORAL | Status: DC

## 2015-06-02 MED ORDER — CHLORTHALIDONE 25 MG PO TABS: 12.5000 mg | ORAL_TABLET | Freq: Every day | ORAL | Status: DC

## 2015-06-02 MED ORDER — METOPROLOL SUCCINATE ER 100 MG PO TB24: ORAL_TABLET | ORAL | Status: DC

## 2015-06-10 ENCOUNTER — Other Ambulatory Visit: Payer: Self-pay | Admitting: Cardiovascular Disease

## 2015-06-10 ENCOUNTER — Ambulatory Visit (HOSPITAL_COMMUNITY)
Admission: RE | Admit: 2015-06-10 | Discharge: 2015-06-10 | Disposition: A | Discharge status: Home / Self Care | Payer: Medicare Other | Source: Ambulatory Visit | Attending: Cardiovascular Disease | Admitting: Cardiovascular Disease

## 2015-06-10 DIAGNOSIS — I739 Peripheral vascular disease, unspecified: Secondary | ICD-10-CM | POA: Insufficient documentation

## 2015-06-11 ENCOUNTER — Other Ambulatory Visit: Payer: Self-pay | Admitting: Cardiovascular Disease

## 2015-06-15 NOTE — Progress Notes (Signed)
OK. Thx

## 2015-06-17 LAB — CBC WITH DIFFERENTIAL/PLATELET
BASOS ABS: 0 10*3/uL (ref 0.0–0.1)
BASOS PCT: 0 % (ref 0–1)
EOS ABS: 0.2 10*3/uL (ref 0.0–0.7)
EOS PCT: 2 % (ref 0–5)
HCT: 37.4 % — ABNORMAL LOW (ref 39.0–52.0)
Hemoglobin: 12.4 g/dL — ABNORMAL LOW (ref 13.0–17.0)
LYMPHS ABS: 3.1 10*3/uL (ref 0.7–4.0)
Lymphocytes Relative: 27 % (ref 12–46)
MCH: 36.9 pg — ABNORMAL HIGH (ref 26.0–34.0)
MCHC: 33.2 g/dL (ref 30.0–36.0)
MCV: 111.3 fL — AB (ref 78.0–100.0)
MPV: 9.8 fL (ref 8.6–12.4)
Monocytes Absolute: 1 10*3/uL (ref 0.1–1.0)
Monocytes Relative: 9 % (ref 3–12)
NEUTROS PCT: 62 % (ref 43–77)
Neutro Abs: 7.1 10*3/uL (ref 1.7–7.7)
PLATELETS: 335 10*3/uL (ref 150–400)
RBC: 3.36 MIL/uL — AB (ref 4.22–5.81)
RDW: 14.2 % (ref 11.5–15.5)
WBC: 11.4 10*3/uL — AB (ref 4.0–10.5)

## 2015-06-17 LAB — TSH: TSH: 0.9 mIU/L (ref 0.40–4.50)

## 2015-06-17 LAB — BASIC METABOLIC PANEL
BUN: 17 mg/dL (ref 7–25)
CALCIUM: 9.3 mg/dL (ref 8.6–10.3)
CHLORIDE: 103 mmol/L (ref 98–110)
CO2: 22 mmol/L (ref 20–31)
CREATININE: 1 mg/dL (ref 0.70–1.18)
Glucose, Bld: 86 mg/dL (ref 65–99)
Potassium: 4 mmol/L (ref 3.5–5.3)
Sodium: 138 mmol/L (ref 135–146)

## 2015-06-17 LAB — PROTIME-INR
INR: 1.06 (ref <1.50)
Prothrombin Time: 13.9 seconds (ref 11.6–15.2)

## 2015-06-17 LAB — APTT: aPTT: 36 seconds (ref 24–37)

## 2015-06-21 ENCOUNTER — Encounter (HOSPITAL_COMMUNITY): Payer: Self-pay | Admitting: Cardiovascular Disease

## 2015-06-21 ENCOUNTER — Ambulatory Visit (HOSPITAL_COMMUNITY)
Admission: RE | Admit: 2015-06-21 | Discharge: 2015-06-21 | Disposition: A | Discharge status: Home / Self Care | Payer: Medicare Other | Source: Ambulatory Visit | Attending: Cardiovascular Disease | Admitting: Cardiovascular Disease

## 2015-06-21 ENCOUNTER — Encounter (HOSPITAL_COMMUNITY)
Admission: RE | Disposition: A | Discharge status: Home / Self Care | Payer: Self-pay | Source: Ambulatory Visit | Attending: Cardiovascular Disease

## 2015-06-21 DIAGNOSIS — I10 Essential (primary) hypertension: Secondary | ICD-10-CM | POA: Insufficient documentation

## 2015-06-21 DIAGNOSIS — I70213 Atherosclerosis of native arteries of extremities with intermittent claudication, bilateral legs: Secondary | ICD-10-CM | POA: Insufficient documentation

## 2015-06-21 DIAGNOSIS — E785 Hyperlipidemia, unspecified: Secondary | ICD-10-CM | POA: Diagnosis not present

## 2015-06-21 DIAGNOSIS — I739 Peripheral vascular disease, unspecified: Secondary | ICD-10-CM

## 2015-06-21 HISTORY — PX: PERIPHERAL VASCULAR CATHETERIZATION: SHX172C

## 2015-06-21 SURGERY — LOWER EXTREMITY ANGIOGRAPHY

## 2015-06-21 MED ORDER — LIDOCAINE HCL (PF) 1 % IJ SOLN
INTRAMUSCULAR | Status: AC
  Filled 2015-06-21: qty 30

## 2015-06-21 MED ORDER — ACETAMINOPHEN 325 MG PO TABS: 650.0000 mg | ORAL_TABLET | ORAL | Status: DC | PRN

## 2015-06-21 MED ORDER — HYDRALAZINE HCL 20 MG/ML IJ SOLN: 10.0000 mg | INTRAMUSCULAR | Status: DC | PRN

## 2015-06-21 MED ORDER — SODIUM CHLORIDE 0.9 % IV SOLN: INTRAVENOUS | Status: AC

## 2015-06-21 MED ORDER — SODIUM CHLORIDE 0.9 % WEIGHT BASED INFUSION: 1.0000 mL/kg/h | INTRAVENOUS | Status: DC

## 2015-06-21 MED ORDER — SODIUM CHLORIDE 0.9 % WEIGHT BASED INFUSION
3.0000 mL/kg/h | INTRAVENOUS | Status: DC
  Administered 2015-06-21: 3 mL/kg/h via INTRAVENOUS

## 2015-06-21 MED ORDER — LIDOCAINE HCL (PF) 1 % IJ SOLN
INTRAMUSCULAR | Status: DC | PRN
  Administered 2015-06-21: 30 mL

## 2015-06-21 MED ORDER — MORPHINE SULFATE (PF) 2 MG/ML IV SOLN: 2.0000 mg | INTRAVENOUS | Status: DC | PRN

## 2015-06-21 MED ORDER — HEPARIN (PORCINE) IN NACL 2-0.9 UNIT/ML-% IJ SOLN
INTRAMUSCULAR | Status: AC
  Filled 2015-06-21: qty 1000

## 2015-06-21 MED ORDER — ASPIRIN 81 MG PO CHEW
81.0000 mg | CHEWABLE_TABLET | ORAL | Status: AC
  Administered 2015-06-21: 81 mg via ORAL

## 2015-06-21 MED ORDER — ONDANSETRON HCL 4 MG/2ML IJ SOLN: 4.0000 mg | Freq: Four times a day (QID) | INTRAMUSCULAR | Status: DC | PRN

## 2015-06-21 MED ORDER — SODIUM CHLORIDE 0.9 % IJ SOLN: 3.0000 mL | INTRAMUSCULAR | Status: DC | PRN

## 2015-06-21 MED ORDER — ASPIRIN 81 MG PO CHEW
CHEWABLE_TABLET | ORAL | Status: AC
  Filled 2015-06-21: qty 1

## 2015-06-21 MED ORDER — HEPARIN (PORCINE) IN NACL 2-0.9 UNIT/ML-% IJ SOLN
INTRAMUSCULAR | Status: DC | PRN
Start: 2015-06-21 — End: 2015-06-21
  Administered 2015-06-21: 1000 mL

## 2015-06-21 MED ORDER — IODIXANOL 320 MG/ML IV SOLN
INTRAVENOUS | Status: DC | PRN
  Administered 2015-06-21: 115 mL via INTRA_ARTERIAL

## 2015-06-21 SURGICAL SUPPLY — 11 items
CATH ANGIO 5F PIGTAIL 65CM (CATHETERS) ×2 IMPLANT
CATH CROSS OVER TEMPO 5F (CATHETERS) ×2 IMPLANT
CATH STRAIGHT 5FR 65CM (CATHETERS) ×2 IMPLANT
KIT PV (KITS) ×4 IMPLANT
SHEATH PINNACLE 5F 10CM (SHEATH) ×2 IMPLANT
STOPCOCK MORSE 400PSI 3WAY (MISCELLANEOUS) ×2 IMPLANT
SYRINGE MEDRAD AVANTA MACH 7 (SYRINGE) ×2 IMPLANT
TRANSDUCER W/STOPCOCK (MISCELLANEOUS) ×4 IMPLANT
TRAY PV CATH (CUSTOM PROCEDURE TRAY) ×4 IMPLANT
TUBING CIL FLEX 10 FLL-RA (TUBING) ×2 IMPLANT
WIRE HITORQ VERSACORE ST 145CM (WIRE) ×2 IMPLANT

## 2015-06-21 NOTE — Discharge Instructions (Signed)
Angiogram, Care After °Refer to this sheet in the next few weeks. These instructions provide you with information about caring for yourself after your procedure. Your health care provider may also give you more specific instructions. Your treatment has been planned according to current medical practices, but problems sometimes occur. Call your health care provider if you have any problems or questions after your procedure. °WHAT TO EXPECT AFTER THE PROCEDURE °After your procedure, it is typical to have the following: °· Bruising at the catheter insertion site that usually fades within 1-2 weeks. °· Blood collecting in the tissue (hematoma) that may be painful to the touch. It should usually decrease in size and tenderness within 1-2 weeks. °HOME CARE INSTRUCTIONS °· Take medicines only as directed by your health care provider. °· You may shower 24-48 hours after the procedure or as directed by your health care provider. Remove the bandage (dressing) and gently wash the site with plain soap and water. Pat the area dry with a clean towel. Do not rub the site, because this may cause bleeding. °· Do not take baths, swim, or use a hot tub until your health care provider approves. °· Check your insertion site every day for redness, swelling, or drainage. °· Do not apply powder or lotion to the site. °· Do not lift over 10 lb (4.5 kg) for 5 days after your procedure or as directed by your health care provider. °· Ask your health care provider when it is okay to: °¨ Return to work or school. °¨ Resume usual physical activities or sports. °¨ Resume sexual activity. °· Do not drive home if you are discharged the same day as the procedure. Have someone else drive you. °· You may drive 24 hours after the procedure unless otherwise instructed by your health care provider. °· Do not operate machinery or power tools for 24 hours after the procedure or as directed by your health care provider. °· If your procedure was done as an  outpatient procedure, which means that you went home the same day as your procedure, a responsible adult should be with you for the first 24 hours after you arrive home. °· Keep all follow-up visits as directed by your health care provider. This is important. °SEEK MEDICAL CARE IF: °· You have a fever. °· You have chills. °· You have increased bleeding from the catheter insertion site. Hold pressure on the site.  CALL 911 °SEEK IMMEDIATE MEDICAL CARE IF: °· You have unusual pain at the catheter insertion site. °· You have redness, warmth, or swelling at the catheter insertion site. °· You have drainage (other than a small amount of blood on the dressing) from the catheter insertion site. °· The catheter insertion site is bleeding, and the bleeding does not stop after 30 minutes of holding steady pressure on the site. °· The area near or just beyond the catheter insertion site becomes pale, cool, tingly, or numb. °  °This information is not intended to replace advice given to you by your health care provider. Make sure you discuss any questions you have with your health care provider. °  °Document Released: 11/10/2004 Document Revised: 05/15/2014 Document Reviewed: 09/25/2012 °Elsevier Interactive Patient Education ©2016 Elsevier [employer] ° °

## 2015-06-21 NOTE — Progress Notes (Signed)
Site area: RFA Site Prior to Removal:  Level 0 Pressure Applied For:20 min Manual:   yes Patient Status During Pull:  stagble Post Pull Site:  Level 0 Post Pull Instructions Given:  given Post Pull Pulses Present: doppler Dressing Applied:  clear Bedrest begins @ 0910 1310 Comments:

## 2015-06-21 NOTE — H&P (View-Only) (Signed)
OK. Thx

## 2015-06-21 NOTE — Interval H&P Note (Signed)
History and Physical Interval Note:  06/21/2015 7:28 AM  Aaron Sexton  has presented today for surgery, with the diagnosis of pad  The various methods of treatment have been discussed with the patient and family. After consideration of risks, benefits and other options for treatment, the patient has consented to  Procedure(s): Lower Extremity Angiography (N/A) as a surgical intervention .  The patient's history has been reviewed, patient examined, no change in status, stable for surgery.  I have reviewed the patient's chart and labs.  Questions were answered to the patient's satisfaction.     [name]

## 2015-06-22 ENCOUNTER — Telehealth: Payer: Self-pay | Admitting: Cardiovascular Disease

## 2015-06-22 NOTE — Telephone Encounter (Signed)
New MEssage  Pt wife calling to speak w/ RN concerning angiogram from 2/15.[name] Please call back and discuss.

## 2015-06-22 NOTE — Telephone Encounter (Signed)
Pt wife requesting pt see Dr. Gwenlyn Found for his f/u rather than PA. Msg sent to scheduling.

## 2015-07-07 ENCOUNTER — Ambulatory Visit: Payer: Medicare Other | Admitting: Physician Assistant

## 2015-07-14 ENCOUNTER — Encounter: Payer: Self-pay | Admitting: Cardiovascular Disease

## 2015-07-14 ENCOUNTER — Ambulatory Visit (INDEPENDENT_AMBULATORY_CARE_PROVIDER_SITE_OTHER): Payer: Medicare Other | Admitting: Cardiovascular Disease

## 2015-07-14 VITALS — BP 160/68 | HR 88 | Ht 70.0 in | Wt 224.0 lb

## 2015-07-14 DIAGNOSIS — I739 Peripheral vascular disease, unspecified: Secondary | ICD-10-CM

## 2015-07-14 DIAGNOSIS — Z01818 Encounter for other preprocedural examination: Secondary | ICD-10-CM

## 2015-07-14 NOTE — Progress Notes (Signed)
07/14/2015 Aaron Sexton   01/04/1946  [id number]  Primary Physician Tillman Abide, MD Primary Cardiologist: Runell Gess MD Aaron Sexton   HPI:  Aaron Sexton is a 70 year old mildly overweight married Caucasian male father of 3, grandfather and 3 grandchildren referred by [name] registered nurse practitioner for evaluation of peripheral arterial disease. His cardiologist is Dr. Dina Rich. He has a history of hypertension and hyperlipidemia. I performed angioplasty of his right coronary artery back in 1994 and he's been a symptomatically since. He stopped smoking at that time. 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 He complains of worsening left greater than right lower extremity claudication. Dopplers performed at [facility] 09/11/13 revealed a right ABI 0.68 and a left ABI 0.59. He peripheral angiography by myself 06/21/15 revealing bilateral SFA occlusions. The right was longer than left although the left was fluoroscopically calcified. He had three-vessel run off bilaterally. We have discussed options regarding treatment including percutaneous vascular station, surgical consultation and medical therapy. He prefers to attempt percutaneous revascularization.   Current Outpatient Prescriptions  Medication Sig Dispense Refill  . aspirin 81 MG tablet Take 81 mg by mouth daily.      . chlorthalidone (HYGROTON) 25 MG tablet Take 0.5 tablets (12.5 mg total) by mouth daily. 135 tablet 3  . cimetidine (TAGAMET) 400 MG tablet Take 1 tablet (400 mg total) by mouth 2 (two) times daily as needed. (Patient taking differently: Take 400 mg by mouth 2 (two) times daily as needed (indigestion). ) 60 tablet 3  . losartan (COZAAR) 25 MG tablet TAKE 1 TABLET (25 MG TOTAL) BY MOUTH DAILY. 90 tablet 3  . metoprolol succinate (TOPROL-XL) 100 MG 24 hr tablet TAKE ONE TABLET BY MOUTH EVERY DAY 90 tablet 3  . nitroGLYCERIN (NITROSTAT) 0.4 MG SL  tablet Place 1 tablet (0.4 mg total) under the tongue every 5 (five) minutes as needed for chest pain. 90 tablet 3  . Omega-3 Fatty Acids (FISH OIL PO) Take 1,000 mg by mouth daily.     . pravastatin (PRAVACHOL) 20 MG tablet Take 1 tablet (20 mg total) by mouth daily. 90 tablet 3  . traMADol (ULTRAM) 50 MG tablet Take 50 mg by mouth every 8 (eight) hours as needed for moderate pain.   1  . vitamin B-12 (CYANOCOBALAMIN) 1000 MCG tablet Take 1,000 mcg by mouth daily.       No current facility-administered medications for this visit.    Allergies  Allergen Reactions  . Diltiazem Hcl Hives and Rash    Social History   Social History  . Marital Status: Married    Spouse Name: N/A  . Number of Children: N/A  . Years of Education: N/A   Occupational History  . 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 Smoking status: Former Smoker -- 1.00 packs/day for 30 years    Quit date: 05/16/1990  . Smokeless tobacco: Never Used     Comment: Quit in 1993  . Alcohol Use: No  . Drug Use: No  . Sexual Activity: Not on file   Other Topics Concern  . Not on file   Social History Narrative   Has living will   Wife is health care POA   Would accept resuscitation but no prolonged artificial ventilation.   No tube feeds if cognitively unaware     Review of Systems:  General: negative for chills, fever, night sweats or weight changes.  Cardiovascular: negative for chest pain, dyspnea on exertion, edema, orthopnea, palpitations, paroxysmal nocturnal dyspnea or shortness of breath Dermatological: negative for rash Respiratory: negative for cough or wheezing Urologic: negative for hematuria Abdominal: negative for nausea, vomiting, diarrhea, bright red blood per rectum, melena, or hematemesis Neurologic: negative for visual changes, syncope, or dizziness All other systems reviewed and are otherwise negative except as noted  above.    Blood pressure 160/68, pulse 88, height 5\' 10"  (1.778 m), weight 224 lb (101.606 kg).  General appearance: alert and no distress Neck: no adenopathy, no carotid bruit, no JVD, supple, symmetrical, trachea midline and thyroid not enlarged, symmetric, no tenderness/mass/nodules Lungs: clear to auscultation bilaterally Heart: regular rate and rhythm, S1, S2 normal, no murmur, click, rub or gallop Extremities: extremities normal, atraumatic, no cyanosis or edema  EKG not performed today  ASSESSMENT AND PLAN:   Peripheral vascular disease (HCC) Aaron Sexton returns after his recent peripheral angiogram which I performed 06/21/15. He does have lifestyle limiting claudication. He has bilateral SFA occlusion. Left SFA has a small to medium with occlusion appears fluoroscopically calcified with three-vessel runoff. The right SFA has a longer occlusion beginning at the origin down to at the adductor canal. I have discussed options including percutaneous intervention,* surgery or conservative medical therapy. After careful discussion of risks and benefits he desires to proceed with attempt at right SFA percutaneous revascularization. I'll schedule this for March 23. I discussed the risks and benefits with the patient and his wife.      Runell Gess MD FACP,FACC,FAHA, Aaron Sexton 07/14/2015 5:08 PM

## 2015-07-14 NOTE — Assessment & Plan Note (Signed)
Aaron Sexton returns after his recent peripheral angiogram which I performed 06/21/15. He does have lifestyle limiting claudication. He has bilateral SFA occlusion. Left SFA has a small to medium with occlusion appears fluoroscopically calcified with three-vessel runoff. The right SFA has a longer occlusion beginning at the origin down to at the adductor canal. I have discussed options including percutaneous intervention,* surgery or conservative medical therapy. After careful discussion of risks and benefits he desires to proceed with attempt at right SFA percutaneous revascularization. I'll schedule this for March 23. I discussed the risks and benefits with the patient and his wife.

## 2015-07-14 NOTE — Patient Instructions (Signed)
Medication Instructions:  Your physician recommends that you continue on your current medications as directed. Please refer to the Current Medication list given to you today.   Labwork: Your physician recommends that you return for lab work in: 7 days before procedure -  The lab can be found on the FIRST FLOOR of out building in [address]   Testing/Procedures: Dr. Gwenlyn Found has ordered a peripheral angiogram to be done at [facility].  This procedure is going to look at the bloodflow in your lower extremities.  If Dr. Gwenlyn Found is able to open up the arteries, you will have to spend one night in the hospital.  If he is not able to open the arteries, you will be able to go home that same day.    After the procedure, you will not be allowed to drive for 3 days or push, pull, or lift anything greater than 10 lbs for one week.    You will be required to have the following tests prior to the procedure:  1. Blood work-the blood work can be done no more than 7 days prior to the procedure.  It can be done at any [facility] lab.  There is one downstairs on the first floor of this building and one in the [facility] building [phone] [address], [address])  2. *REPS: Scott  Puncture site: Left   Any Other Special Instructions Will Be Listed Below (If Applicable).     If you need a refill on your cardiac medications before your next appointment, please call your pharmacy.

## 2015-07-19 LAB — CBC WITH DIFFERENTIAL/PLATELET
BASOS PCT: 1 % (ref 0–1)
Basophils Absolute: 0.1 10*3/uL (ref 0.0–0.1)
EOS ABS: 0.3 10*3/uL (ref 0.0–0.7)
EOS PCT: 3 % (ref 0–5)
HCT: 35.6 % — ABNORMAL LOW (ref 39.0–52.0)
Hemoglobin: 12 g/dL — ABNORMAL LOW (ref 13.0–17.0)
LYMPHS ABS: 2.6 10*3/uL (ref 0.7–4.0)
Lymphocytes Relative: 29 % (ref 12–46)
MCH: 37.5 pg — AB (ref 26.0–34.0)
MCHC: 33.7 g/dL (ref 30.0–36.0)
MCV: 111.3 fL — AB (ref 78.0–100.0)
MONO ABS: 0.9 10*3/uL (ref 0.1–1.0)
MONOS PCT: 10 % (ref 3–12)
MPV: 9.8 fL (ref 8.6–12.4)
Neutro Abs: 5.1 10*3/uL (ref 1.7–7.7)
Neutrophils Relative %: 57 % (ref 43–77)
PLATELETS: 314 10*3/uL (ref 150–400)
RBC: 3.2 MIL/uL — ABNORMAL LOW (ref 4.22–5.81)
RDW: 14 % (ref 11.5–15.5)
WBC: 8.9 10*3/uL (ref 4.0–10.5)

## 2015-07-20 LAB — BASIC METABOLIC PANEL
BUN: 14 mg/dL (ref 7–25)
CALCIUM: 8.8 mg/dL (ref 8.6–10.3)
CHLORIDE: 104 mmol/L (ref 98–110)
CO2: 27 mmol/L (ref 20–31)
CREATININE: 1.02 mg/dL (ref 0.70–1.18)
GLUCOSE: 98 mg/dL (ref 65–99)
Potassium: 3.8 mmol/L (ref 3.5–5.3)
Sodium: 140 mmol/L (ref 135–146)

## 2015-07-20 LAB — PROTIME-INR
INR: 1.04 (ref <1.50)
Prothrombin Time: 13.7 seconds (ref 11.6–15.2)

## 2015-07-20 LAB — APTT: APTT: 33 s (ref 24–37)

## 2015-07-23 ENCOUNTER — Other Ambulatory Visit: Payer: Self-pay

## 2015-07-23 DIAGNOSIS — I739 Peripheral vascular disease, unspecified: Secondary | ICD-10-CM

## 2015-07-26 ENCOUNTER — Ambulatory Visit (HOSPITAL_COMMUNITY)
Admission: RE | Admit: 2015-07-26 | Discharge: 2015-07-26 | Disposition: A | Discharge status: Home / Self Care | Payer: Medicare Other | Source: Ambulatory Visit | Attending: Cardiovascular Disease | Admitting: Cardiovascular Disease

## 2015-07-26 ENCOUNTER — Encounter (HOSPITAL_COMMUNITY)
Admission: RE | Disposition: A | Discharge status: Home / Self Care | Payer: Self-pay | Source: Ambulatory Visit | Attending: Cardiovascular Disease

## 2015-07-26 DIAGNOSIS — E663 Overweight: Secondary | ICD-10-CM | POA: Insufficient documentation

## 2015-07-26 DIAGNOSIS — I739 Peripheral vascular disease, unspecified: Secondary | ICD-10-CM

## 2015-07-26 DIAGNOSIS — Z9861 Coronary angioplasty status: Secondary | ICD-10-CM | POA: Insufficient documentation

## 2015-07-26 DIAGNOSIS — Z87891 Personal history of nicotine dependence: Secondary | ICD-10-CM | POA: Diagnosis not present

## 2015-07-26 DIAGNOSIS — I10 Essential (primary) hypertension: Secondary | ICD-10-CM | POA: Diagnosis not present

## 2015-07-26 DIAGNOSIS — I700 Atherosclerosis of aorta: Secondary | ICD-10-CM | POA: Insufficient documentation

## 2015-07-26 DIAGNOSIS — Z6831 Body mass index (BMI) 31.0-31.9, adult: Secondary | ICD-10-CM | POA: Insufficient documentation

## 2015-07-26 DIAGNOSIS — E785 Hyperlipidemia, unspecified: Secondary | ICD-10-CM | POA: Insufficient documentation

## 2015-07-26 DIAGNOSIS — I70213 Atherosclerosis of native arteries of extremities with intermittent claudication, bilateral legs: Secondary | ICD-10-CM | POA: Diagnosis not present

## 2015-07-26 DIAGNOSIS — I2510 Atherosclerotic heart disease of native coronary artery without angina pectoris: Secondary | ICD-10-CM | POA: Diagnosis not present

## 2015-07-26 DIAGNOSIS — Z7982 Long term (current) use of aspirin: Secondary | ICD-10-CM | POA: Insufficient documentation

## 2015-07-26 HISTORY — PX: PERIPHERAL VASCULAR CATHETERIZATION: SHX172C

## 2015-07-26 SURGERY — LOWER EXTREMITY ANGIOGRAPHY

## 2015-07-26 MED ORDER — NITROGLYCERIN 1 MG/10 ML FOR IR/CATH LAB
INTRA_ARTERIAL | Status: DC | PRN
  Administered 2015-07-26: 08:00:00

## 2015-07-26 MED ORDER — LIDOCAINE HCL (PF) 1 % IJ SOLN
INTRAMUSCULAR | Status: AC
  Filled 2015-07-26: qty 30

## 2015-07-26 MED ORDER — ACETAMINOPHEN 325 MG PO TABS: 650.0000 mg | ORAL_TABLET | ORAL | Status: DC | PRN

## 2015-07-26 MED ORDER — IODIXANOL 320 MG/ML IV SOLN
INTRAVENOUS | Status: DC | PRN
  Administered 2015-07-26: 70 mL via INTRA_ARTERIAL

## 2015-07-26 MED ORDER — MIDAZOLAM HCL 2 MG/2ML IJ SOLN
INTRAMUSCULAR | Status: AC
  Filled 2015-07-26: qty 2

## 2015-07-26 MED ORDER — HEPARIN (PORCINE) IN NACL 2-0.9 UNIT/ML-% IJ SOLN
INTRAMUSCULAR | Status: AC
  Filled 2015-07-26: qty 1000

## 2015-07-26 MED ORDER — SODIUM CHLORIDE 0.9 % WEIGHT BASED INFUSION
3.0000 mL/kg/h | INTRAVENOUS | Status: AC
  Administered 2015-07-26: 3 mL/kg/h via INTRAVENOUS

## 2015-07-26 MED ORDER — FENTANYL CITRATE (PF) 100 MCG/2ML IJ SOLN
INTRAMUSCULAR | Status: DC | PRN
  Administered 2015-07-26: 25 ug via INTRAVENOUS

## 2015-07-26 MED ORDER — SODIUM CHLORIDE 0.9% FLUSH: 3.0000 mL | INTRAVENOUS | Status: DC | PRN

## 2015-07-26 MED ORDER — MORPHINE SULFATE (PF) 2 MG/ML IV SOLN: 2.0000 mg | INTRAVENOUS | Status: DC | PRN

## 2015-07-26 MED ORDER — SODIUM CHLORIDE 0.9 % WEIGHT BASED INFUSION: 1.0000 mL/kg/h | INTRAVENOUS | Status: DC

## 2015-07-26 MED ORDER — SODIUM CHLORIDE 0.9 % IV SOLN: INTRAVENOUS | Status: AC

## 2015-07-26 MED ORDER — HEPARIN SODIUM (PORCINE) 1000 UNIT/ML IJ SOLN
INTRAMUSCULAR | Status: AC
  Filled 2015-07-26: qty 1

## 2015-07-26 MED ORDER — ONDANSETRON HCL 4 MG/2ML IJ SOLN: 4.0000 mg | Freq: Four times a day (QID) | INTRAMUSCULAR | Status: DC | PRN

## 2015-07-26 MED ORDER — ASPIRIN 81 MG PO CHEW: 81.0000 mg | CHEWABLE_TABLET | ORAL | Status: DC

## 2015-07-26 MED ORDER — NITROGLYCERIN 1 MG/10 ML FOR IR/CATH LAB
INTRA_ARTERIAL | Status: DC | PRN
  Administered 2015-07-26: 200 ug via INTRACORONARY

## 2015-07-26 MED ORDER — MIDAZOLAM HCL 2 MG/2ML IJ SOLN
INTRAMUSCULAR | Status: DC | PRN
  Administered 2015-07-26: 1 mg via INTRAVENOUS

## 2015-07-26 MED ORDER — FENTANYL CITRATE (PF) 100 MCG/2ML IJ SOLN
INTRAMUSCULAR | Status: AC
  Filled 2015-07-26: qty 2

## 2015-07-26 MED ORDER — NITROGLYCERIN 1 MG/10 ML FOR IR/CATH LAB
INTRA_ARTERIAL | Status: AC
  Filled 2015-07-26: qty 10

## 2015-07-26 SURGICAL SUPPLY — 12 items
CATH ANGIO 5F PIGTAIL 65CM (CATHETERS) ×2 IMPLANT
CATH CROSS OVER TEMPO 5F (CATHETERS) ×2 IMPLANT
CATH SOFT-VU 4F 65 STRAIGHT (CATHETERS) IMPLANT
CATH SOFT-VU STRAIGHT 4F 65CM (CATHETERS) ×3
GUIDEWIRE ANGLED .035X150CM (WIRE) ×2 IMPLANT
KIT PV (KITS) ×3 IMPLANT
SHEATH PINNACLE 5F 10CM (SHEATH) ×2 IMPLANT
SYR MEDRAD MARK V 150ML (SYRINGE) ×3 IMPLANT
TRANSDUCER W/STOPCOCK (MISCELLANEOUS) ×3 IMPLANT
TRAY PV CATH (CUSTOM PROCEDURE TRAY) ×3 IMPLANT
TUBING CIL FLEX 10 FLL-RA (TUBING) ×2 IMPLANT
WIRE HITORQ VERSACORE ST 145CM (WIRE) ×2 IMPLANT

## 2015-07-26 NOTE — Progress Notes (Signed)
Site area: rt groin Site Prior to Removal:  Level 0  Pressure Applied For:  20 minutes Manual:   yes Patient Status During Pull:  stable Post Pull Site:  Level  0 Post Pull Instructions Given:  yes Post Pull Pulses Present: yes Dressing Applied:  tegaderm Bedrest begins @  0930 Comments:

## 2015-07-26 NOTE — Discharge Instructions (Signed)

## 2015-07-26 NOTE — H&P (Signed)
07/26/15 HERO Aaron Sexton  1945/08/14  [id number]  Primary Physician Tillman Abide, MD Primary Cardiologist: Runell Gess MD [name]   HPI: [name] is a 70 year old mildly overweight married Caucasian male father of 3, grandfather and 3 grandchildren referred by [name] registered nurse practitioner for evaluation of peripheral arterial disease. His cardiologist is Dr. Dina Rich. He has a history of hypertension and hyperlipidemia. I performed angioplasty of his right coronary artery back in 1994 and he's been a symptomatically since. He stopped smoking at that time. 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 He complains of worsening left greater than right lower extremity claudication. Dopplers performed at [facility] 09/11/13 revealed a right ABI 0.68 and a left ABI 0.59.   Current Outpatient Prescriptions  Medication Sig Dispense Refill  . aspirin 81 MG tablet Take 81 mg by mouth daily.     . chlorthalidone (HYGROTON) 25 MG tablet Take 0.5 tablets (12.5 mg total) by mouth daily. 135 tablet 3  . cimetidine (TAGAMET) 400 MG tablet Take 1 tablet (400 mg total) by mouth 2 (two) times daily as needed. 60 tablet 3  . losartan (COZAAR) 25 MG tablet TAKE 1 TABLET (25 MG TOTAL) BY MOUTH DAILY. 90 tablet 3  . metoprolol succinate (TOPROL-XL) 100 MG 24 hr tablet TAKE ONE TABLET BY MOUTH EVERY DAY 90 tablet 3  . nitroGLYCERIN (NITROSTAT) 0.4 MG SL tablet Place 1 tablet (0.4 mg total) under the tongue every 5 (five) minutes as needed for chest pain. 90 tablet 3  . Omega-3 Fatty Acids (FISH OIL PO) Take by mouth.     . pravastatin (PRAVACHOL) 20 MG tablet Take 1 tablet (20 mg total) by mouth daily. 90 tablet 3  . traMADol (ULTRAM) 50 MG tablet Take 50 mg by mouth every 8 (eight) hours as needed.  1  . vitamin B-12 (CYANOCOBALAMIN) 1000 MCG tablet Take 1,000 mcg by  mouth daily.      No current facility-administered medications for this visit.    Allergies  Allergen Reactions  . Diltiazem Hcl     Social History   Social History  . Marital Status: Married    Spouse Name: N/A  . Number of Children: N/A  . Years of Education: N/A   Occupational History  . 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 Smoking status: Former Smoker -- 1.00 packs/day for 30 years    Quit date: 05/16/1990  . Smokeless tobacco: Never Used     Comment: Quit in 1993  . Alcohol Use: No  . Drug Use: No  . Sexual Activity: Not on file   Other Topics Concern  . Not on file   Social History Narrative   Has living will   Wife is health care POA   Would accept resuscitation but no prolonged artificial ventilation.   No tube feeds if cognitively unaware     Review of Systems: General: negative for chills, fever, night sweats or weight changes.  Cardiovascular: negative for chest pain, dyspnea on exertion, edema, orthopnea, palpitations, paroxysmal nocturnal dyspnea or shortness of breath Dermatological: negative for rash Respiratory: negative for cough or wheezing Urologic: negative for hematuria Abdominal: negative for nausea, vomiting, diarrhea, bright red blood per rectum, melena, or hematemesis Neurologic: negative for visual changes, syncope, or dizziness All other systems reviewed and are otherwise negative except as noted above.  Blood pressure 130/68, pulse 66, height 5\' 10"  (1.778 m), weight 230 lb 3.2 oz (104.418 kg).  General appearance: alert and no distress Neck: no adenopathy, no JVD, supple, symmetrical, trachea midline, thyroid not enlarged, symmetric, no tenderness/mass/nodules and right carotid bruit Lungs: clear to auscultation bilaterally Heart: regular rate and rhythm, S1, S2 normal, no  murmur, click, rub or gallop Extremities: extremities normal, atraumatic, no cyanosis or edema and absent pedal pulses  EKG not performed today  ASSESSMENT AND PLAN:   Peripheral vascular disease Aaron Sexton was referred to me by [name] nurse practitioner for evaluation of peripheral arterial disease. He has a history of coronary artery disease status post angioplasty of his RCA by myself in 1994. He had Dopplers performed 09/11/13 revealing a right ABI 0.68 and left upper 59 with both SFA and inflow disease. He does complain of left eye limiting claudication left greater than right. I'm going to repeat his lower extremity or till Doppler studies and arrange for him to undergo angiography and potential endovascular therapy.     Runell Gess, M.D., FACP, [facility], [facility] [address] [place], [place]  [address]  [phone] 07/26/2015 7:33 AM

## 2015-07-26 NOTE — Interval H&P Note (Signed)
History and Physical Interval Note:  07/26/2015 7:32 AM  Aaron Sexton  has presented today for surgery, with the diagnosis of claudication/pad  The various methods of treatment have been discussed with the patient and family. After consideration of risks, benefits and other options for treatment, the patient has consented to  Procedure(s): Lower Extremity Angiography (N/A) as a surgical intervention .  The patient's history has been reviewed, patient examined, no change in status, stable for surgery.  I have reviewed the patient's chart and labs.  Questions were answered to the patient's satisfaction.     [name]

## 2015-07-26 NOTE — H&P (View-Only) (Signed)
07/14/2015 Aaron Sexton   01/04/1946  [id number]  Primary Physician Tillman Abide, MD Primary Cardiologist: Runell Gess MD [name]   HPI:  Mr. Balsamo is a 70 year old mildly overweight married Caucasian male father of 3, grandfather and 3 grandchildren referred by [name] registered nurse practitioner for evaluation of peripheral arterial disease. His cardiologist is Dr. Dina Rich. He has a history of hypertension and hyperlipidemia. I performed angioplasty of his right coronary artery back in 1994 and he's been a symptomatically since. He stopped smoking at that time. 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 He complains of worsening left greater than right lower extremity claudication. Dopplers performed at [facility] 09/11/13 revealed a right ABI 0.68 and a left ABI 0.59. He peripheral angiography by myself 06/21/15 revealing bilateral SFA occlusions. The right was longer than left although the left was fluoroscopically calcified. He had three-vessel run off bilaterally. We have discussed options regarding treatment including percutaneous vascular station, surgical consultation and medical therapy. He prefers to attempt percutaneous revascularization.   Current Outpatient Prescriptions  Medication Sig Dispense Refill  . aspirin 81 MG tablet Take 81 mg by mouth daily.      . chlorthalidone (HYGROTON) 25 MG tablet Take 0.5 tablets (12.5 mg total) by mouth daily. 135 tablet 3  . cimetidine (TAGAMET) 400 MG tablet Take 1 tablet (400 mg total) by mouth 2 (two) times daily as needed. (Patient taking differently: Take 400 mg by mouth 2 (two) times daily as needed (indigestion). ) 60 tablet 3  . losartan (COZAAR) 25 MG tablet TAKE 1 TABLET (25 MG TOTAL) BY MOUTH DAILY. 90 tablet 3  . metoprolol succinate (TOPROL-XL) 100 MG 24 hr tablet TAKE ONE TABLET BY MOUTH EVERY DAY 90 tablet 3  . nitroGLYCERIN (NITROSTAT) 0.4 MG SL  tablet Place 1 tablet (0.4 mg total) under the tongue every 5 (five) minutes as needed for chest pain. 90 tablet 3  . Omega-3 Fatty Acids (FISH OIL PO) Take 1,000 mg by mouth daily.     . pravastatin (PRAVACHOL) 20 MG tablet Take 1 tablet (20 mg total) by mouth daily. 90 tablet 3  . traMADol (ULTRAM) 50 MG tablet Take 50 mg by mouth every 8 (eight) hours as needed for moderate pain.   1  . vitamin B-12 (CYANOCOBALAMIN) 1000 MCG tablet Take 1,000 mcg by mouth daily.       No current facility-administered medications for this visit.    Allergies  Allergen Reactions  . Diltiazem Hcl Hives and Rash    Social History   Social History  . Marital Status: Married    Spouse Name: N/A  . Number of Children: N/A  . Years of Education: N/A   Occupational History  . 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 Smoking status: Former Smoker -- 1.00 packs/day for 30 years    Quit date: 05/16/1990  . Smokeless tobacco: Never Used     Comment: Quit in 1993  . Alcohol Use: No  . Drug Use: No  . Sexual Activity: Not on file   Other Topics Concern  . Not on file   Social History Narrative   Has living will   Wife is health care POA   Would accept resuscitation but no prolonged artificial ventilation.   No tube feeds if cognitively unaware     Review of Systems:  General: negative for chills, fever, night sweats or weight changes.  Cardiovascular: negative for chest pain, dyspnea on exertion, edema, orthopnea, palpitations, paroxysmal nocturnal dyspnea or shortness of breath Dermatological: negative for rash Respiratory: negative for cough or wheezing Urologic: negative for hematuria Abdominal: negative for nausea, vomiting, diarrhea, bright red blood per rectum, melena, or hematemesis Neurologic: negative for visual changes, syncope, or dizziness All other systems reviewed and are otherwise negative except as noted  above.    Blood pressure 160/68, pulse 88, height 5\' 10"  (1.778 m), weight 224 lb (101.606 kg).  General appearance: alert and no distress Neck: no adenopathy, no carotid bruit, no JVD, supple, symmetrical, trachea midline and thyroid not enlarged, symmetric, no tenderness/mass/nodules Lungs: clear to auscultation bilaterally Heart: regular rate and rhythm, S1, S2 normal, no murmur, click, rub or gallop Extremities: extremities normal, atraumatic, no cyanosis or edema  EKG not performed today  ASSESSMENT AND PLAN:   Peripheral vascular disease (HCC) Superior returns after his recent peripheral angiogram which I performed 06/21/15. He does have lifestyle limiting claudication. He has bilateral SFA occlusion. Left SFA has a small to medium with occlusion appears fluoroscopically calcified with three-vessel runoff. The right SFA has a longer occlusion beginning at the origin down to at the adductor canal. I have discussed options including percutaneous intervention,* surgery or conservative medical therapy. After careful discussion of risks and benefits he desires to proceed with attempt at right SFA percutaneous revascularization. I'll schedule this for March 23. I discussed the risks and benefits with the patient and his wife.      Runell Gess MD FACP,FACC,FAHA, [facility] 07/14/2015 5:08 PM

## 2015-07-27 ENCOUNTER — Encounter (HOSPITAL_COMMUNITY): Payer: Self-pay | Admitting: Cardiovascular Disease

## 2015-07-27 ENCOUNTER — Ambulatory Visit (INDEPENDENT_AMBULATORY_CARE_PROVIDER_SITE_OTHER): Payer: Medicare Other | Admitting: Cardiovascular Disease

## 2015-07-27 VITALS — BP 140/68 | HR 81 | Ht 70.5 in | Wt 224.7 lb

## 2015-07-27 DIAGNOSIS — I739 Peripheral vascular disease, unspecified: Secondary | ICD-10-CM

## 2015-07-27 MED FILL — Heparin Sodium (Porcine) 2 Unit/ML in Sodium Chloride 0.9%: INTRAMUSCULAR | Qty: 500 | Status: AC

## 2015-07-27 MED FILL — Lidocaine HCl Local Preservative Free (PF) Inj 1%: INTRAMUSCULAR | Qty: 30 | Status: AC

## 2015-07-27 MED FILL — Heparin Sodium (Porcine) Inj 1000 Unit/ML: INTRAMUSCULAR | Qty: 10 | Status: AC

## 2015-07-27 NOTE — Progress Notes (Signed)
Mr. Dehn returns today after undergoing angiography yesterday. 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 My plan is to perform diamondback orbital rotational atherectomy, PTCA and stenting of his right external iliac artery. Right femoral approach on April 3 with potential left SFA intervention at the same time. Discussed this approach the patient and family agrees to proceed.

## 2015-07-27 NOTE — Patient Instructions (Signed)
Medication Instructions:  Your physician recommends that you continue on your current medications as directed. Please refer to the Current Medication list given to you today.   Labwork: Your physician recommends that you return for lab work in: 7 days before procedure The lab can be found on the FIRST FLOOR of out building in [address]   Testing/Procedures: Dr. Gwenlyn Found has ordered a peripheral angiogram to be done at [facility].  This procedure is going to look at the bloodflow in your lower extremities.  If Dr. Gwenlyn Found is able to open up the arteries, you will have to spend one night in the hospital.  If he is not able to open the arteries, you will be able to go home that same day.  SCHEDULE ON 4/3  After the procedure, you will not be allowed to drive for 3 days or push, pull, or lift anything greater than 10 lbs for one week.    You will be required to have the following tests prior to the procedure:  1. Blood work-the blood work can be done no more than 7 days prior to the procedure.  It can be done at any [facility] lab.  There is one downstairs on the first floor of this building and one in the [facility] building [phone] [address])  *REPS: SCOTT AND ERIC  Puncture site RIGHT GROIN    Any Other Special Instructions Will Be Listed Below (If Applicable).     If you need a refill on your cardiac medications before your next appointment, please call your pharmacy.

## 2015-07-27 NOTE — Assessment & Plan Note (Signed)
Aaron Sexton returns today after undergoing repeat angiography yesterday. I found a 90% calcific exophytic plaque in the right external iliac artery with a 30 mm gradient after intra-articular to glycerin. He does have total SFAs bilaterally. 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 We talked about performing diamondback orbital rotational atherectomy on the right external iliac artery stenosis followed by potential left SFA intervention. The patient is agreeable with this approach.

## 2015-07-28 ENCOUNTER — Telehealth: Payer: Self-pay | Admitting: *Deleted

## 2015-07-28 ENCOUNTER — Ambulatory Visit: Payer: Medicare Other | Admitting: Cardiovascular Disease

## 2015-07-28 ENCOUNTER — Encounter: Payer: Self-pay | Admitting: Cardiovascular Disease

## 2015-07-28 NOTE — Telephone Encounter (Signed)
Spoke with patient regarding procedure scheduled for Monday 08/09/15 at 9:00 am at [place] time at [place] Stay is 7:00 am--NPO after midnight and plan on spending one night in the hospital.  Please have pre procedure labs done on Tuesday 08/03/15.  I also told patient I would mail a copy of his instructions to him.  He voiced his understanding.

## 2015-07-28 NOTE — Telephone Encounter (Signed)
Spoke with Randall Hiss and [place] regarding procedure scheduled for Monday 08/09/15 at [facility].  They both voiced their understanding.

## 2015-07-29 ENCOUNTER — Telehealth: Payer: Self-pay

## 2015-07-29 ENCOUNTER — Other Ambulatory Visit: Payer: Self-pay

## 2015-07-29 DIAGNOSIS — I739 Peripheral vascular disease, unspecified: Secondary | ICD-10-CM

## 2015-07-29 NOTE — Telephone Encounter (Signed)
Great - thanks

## 2015-07-29 NOTE — Telephone Encounter (Signed)
Spoke to patient after receiving notes from Dr Gwenlyn Found. He said he is feeling well. Having another procedure in April, also. Appreciated our call

## 2015-08-03 ENCOUNTER — Ambulatory Visit: Payer: Medicare Other | Admitting: Cardiovascular Disease

## 2015-08-04 LAB — CBC WITH DIFFERENTIAL/PLATELET
BASOS ABS: 0.1 10*3/uL (ref 0.0–0.1)
Basophils Relative: 1 % (ref 0–1)
EOS ABS: 0.2 10*3/uL (ref 0.0–0.7)
EOS PCT: 2 % (ref 0–5)
HCT: 35.3 % — ABNORMAL LOW (ref 39.0–52.0)
Hemoglobin: 11.7 g/dL — ABNORMAL LOW (ref 13.0–17.0)
LYMPHS PCT: 28 % (ref 12–46)
Lymphs Abs: 2.8 10*3/uL (ref 0.7–4.0)
MCH: 36.8 pg — ABNORMAL HIGH (ref 26.0–34.0)
MCHC: 33.1 g/dL (ref 30.0–36.0)
MCV: 111 fL — ABNORMAL HIGH (ref 78.0–100.0)
MPV: 9.8 fL (ref 8.6–12.4)
Monocytes Absolute: 0.7 10*3/uL (ref 0.1–1.0)
Monocytes Relative: 7 % (ref 3–12)
NEUTROS PCT: 62 % (ref 43–77)
Neutro Abs: 6.1 10*3/uL (ref 1.7–7.7)
PLATELETS: 345 10*3/uL (ref 150–400)
RBC: 3.18 MIL/uL — AB (ref 4.22–5.81)
RDW: 13.9 % (ref 11.5–15.5)
WBC: 9.9 10*3/uL (ref 4.0–10.5)

## 2015-08-04 LAB — BASIC METABOLIC PANEL
BUN: 19 mg/dL (ref 7–25)
CHLORIDE: 105 mmol/L (ref 98–110)
CO2: 26 mmol/L (ref 20–31)
CREATININE: 1.01 mg/dL (ref 0.70–1.18)
Calcium: 9.1 mg/dL (ref 8.6–10.3)
Glucose, Bld: 123 mg/dL — ABNORMAL HIGH (ref 65–99)
POTASSIUM: 4 mmol/L (ref 3.5–5.3)
Sodium: 140 mmol/L (ref 135–146)

## 2015-08-04 LAB — PROTIME-INR
INR: 1.07 (ref <1.50)
Prothrombin Time: 14 seconds (ref 11.6–15.2)

## 2015-08-04 LAB — APTT: APTT: 33 s (ref 24–37)

## 2015-08-07 ENCOUNTER — Other Ambulatory Visit: Payer: Self-pay | Admitting: Internal Medicine

## 2015-08-09 ENCOUNTER — Ambulatory Visit (HOSPITAL_COMMUNITY)
Admission: RE | Admit: 2015-08-09 | Discharge: 2015-08-10 | Disposition: A | Discharge status: Home / Self Care | Payer: Medicare Other | Source: Ambulatory Visit | Attending: Cardiovascular Disease | Admitting: Cardiovascular Disease

## 2015-08-09 ENCOUNTER — Encounter (HOSPITAL_COMMUNITY): Payer: Self-pay | Admitting: General Practice

## 2015-08-09 ENCOUNTER — Encounter (HOSPITAL_COMMUNITY)
Admission: RE | Disposition: A | Discharge status: Home / Self Care | Payer: Self-pay | Source: Ambulatory Visit | Attending: Cardiovascular Disease

## 2015-08-09 DIAGNOSIS — I70211 Atherosclerosis of native arteries of extremities with intermittent claudication, right leg: Secondary | ICD-10-CM | POA: Diagnosis not present

## 2015-08-09 DIAGNOSIS — Z87891 Personal history of nicotine dependence: Secondary | ICD-10-CM | POA: Insufficient documentation

## 2015-08-09 DIAGNOSIS — I2510 Atherosclerotic heart disease of native coronary artery without angina pectoris: Secondary | ICD-10-CM | POA: Diagnosis not present

## 2015-08-09 DIAGNOSIS — Z9861 Coronary angioplasty status: Secondary | ICD-10-CM | POA: Insufficient documentation

## 2015-08-09 DIAGNOSIS — I10 Essential (primary) hypertension: Secondary | ICD-10-CM | POA: Diagnosis not present

## 2015-08-09 DIAGNOSIS — Z7982 Long term (current) use of aspirin: Secondary | ICD-10-CM | POA: Diagnosis not present

## 2015-08-09 DIAGNOSIS — E785 Hyperlipidemia, unspecified: Secondary | ICD-10-CM | POA: Insufficient documentation

## 2015-08-09 DIAGNOSIS — I739 Peripheral vascular disease, unspecified: Secondary | ICD-10-CM | POA: Diagnosis present

## 2015-08-09 DIAGNOSIS — I70213 Atherosclerosis of native arteries of extremities with intermittent claudication, bilateral legs: Secondary | ICD-10-CM | POA: Insufficient documentation

## 2015-08-09 HISTORY — DX: Family history of other specified conditions: Z84.89

## 2015-08-09 HISTORY — PX: ILIAC VEIN ANGIOPLASTY / STENTING: SHX1788

## 2015-08-09 HISTORY — PX: PERIPHERAL VASCULAR CATHETERIZATION: SHX172C

## 2015-08-09 LAB — POCT ACTIVATED CLOTTING TIME
ACTIVATED CLOTTING TIME: 255 s
ACTIVATED CLOTTING TIME: 224 s
ACTIVATED CLOTTING TIME: 183 s
ACTIVATED CLOTTING TIME: 162 s
Activated Clotting Time: 198 seconds
Activated Clotting Time: 204 seconds

## 2015-08-09 SURGERY — LOWER EXTREMITY ANGIOGRAPHY

## 2015-08-09 MED ORDER — NITROGLYCERIN 0.4 MG SL SUBL: 0.4000 mg | SUBLINGUAL_TABLET | SUBLINGUAL | Status: DC | PRN

## 2015-08-09 MED ORDER — HEPARIN SODIUM (PORCINE) 1000 UNIT/ML IJ SOLN
INTRAMUSCULAR | Status: AC
  Filled 2015-08-09: qty 1

## 2015-08-09 MED ORDER — HEPARIN SODIUM (PORCINE) 1000 UNIT/ML IJ SOLN
INTRAMUSCULAR | Status: DC | PRN
  Administered 2015-08-09: 4000 [IU] via INTRAVENOUS
  Administered 2015-08-09: 5000 [IU] via INTRAVENOUS
  Administered 2015-08-09: 6000 [IU] via INTRAVENOUS

## 2015-08-09 MED ORDER — ATROPINE SULFATE 0.1 MG/ML IJ SOLN
INTRAMUSCULAR | Status: AC
  Filled 2015-08-09: qty 10

## 2015-08-09 MED ORDER — VERAPAMIL HCL 2.5 MG/ML IV SOLN
INTRAVENOUS | Status: AC
  Filled 2015-08-09: qty 2

## 2015-08-09 MED ORDER — CLOPIDOGREL BISULFATE 300 MG PO TABS
ORAL_TABLET | ORAL | Status: AC
  Filled 2015-08-09: qty 1

## 2015-08-09 MED ORDER — MORPHINE SULFATE (PF) 2 MG/ML IV SOLN: 2.0000 mg | INTRAVENOUS | Status: DC | PRN

## 2015-08-09 MED ORDER — CLOPIDOGREL BISULFATE 300 MG PO TABS
ORAL_TABLET | ORAL | Status: DC | PRN
  Administered 2015-08-09: 300 mg via ORAL

## 2015-08-09 MED ORDER — LIDOCAINE HCL (PF) 1 % IJ SOLN
INTRAMUSCULAR | Status: AC
  Filled 2015-08-09: qty 30

## 2015-08-09 MED ORDER — SODIUM CHLORIDE 0.9 % WEIGHT BASED INFUSION: 1.0000 mL/kg/h | INTRAVENOUS | Status: DC

## 2015-08-09 MED ORDER — LOSARTAN POTASSIUM 50 MG PO TABS
25.0000 mg | ORAL_TABLET | Freq: Every day | ORAL | Status: DC
  Administered 2015-08-10: 09:00:00 25 mg via ORAL
  Filled 2015-08-09 (×2): qty 1

## 2015-08-09 MED ORDER — MIDAZOLAM HCL 2 MG/2ML IJ SOLN
INTRAMUSCULAR | Status: DC | PRN
  Administered 2015-08-09: 1 mg via INTRAVENOUS

## 2015-08-09 MED ORDER — FENTANYL CITRATE (PF) 100 MCG/2ML IJ SOLN
INTRAMUSCULAR | Status: DC | PRN
  Administered 2015-08-09: 25 ug via INTRAVENOUS

## 2015-08-09 MED ORDER — PRAVASTATIN SODIUM 40 MG PO TABS
20.0000 mg | ORAL_TABLET | Freq: Every day | ORAL | Status: DC
  Administered 2015-08-09: 18:00:00 20 mg via ORAL
  Filled 2015-08-09: qty 1

## 2015-08-09 MED ORDER — METOPROLOL SUCCINATE ER 50 MG PO TB24
100.0000 mg | ORAL_TABLET | Freq: Every day | ORAL | Status: DC
  Administered 2015-08-10: 09:00:00 100 mg via ORAL
  Filled 2015-08-09: qty 2

## 2015-08-09 MED ORDER — CHLORTHALIDONE 25 MG PO TABS
12.5000 mg | ORAL_TABLET | Freq: Every day | ORAL | Status: DC
  Administered 2015-08-10: 12.5 mg via ORAL
  Filled 2015-08-09: qty 0.5

## 2015-08-09 MED ORDER — CLOPIDOGREL BISULFATE 75 MG PO TABS
75.0000 mg | ORAL_TABLET | Freq: Every day | ORAL | Status: DC
  Administered 2015-08-10: 09:00:00 75 mg via ORAL
  Filled 2015-08-09: qty 1

## 2015-08-09 MED ORDER — VIPERSLIDE LUBRICANT OPTIME
TOPICAL | Status: DC | PRN
  Administered 2015-08-09: 10:00:00 via SURGICAL_CAVITY

## 2015-08-09 MED ORDER — SODIUM CHLORIDE 0.9 % WEIGHT BASED INFUSION
3.0000 mL/kg/h | INTRAVENOUS | Status: DC
  Administered 2015-08-09: 3 mL/kg/h via INTRAVENOUS

## 2015-08-09 MED ORDER — SODIUM CHLORIDE 0.9% FLUSH: 3.0000 mL | INTRAVENOUS | Status: DC | PRN

## 2015-08-09 MED ORDER — NITROGLYCERIN IN D5W 200-5 MCG/ML-% IV SOLN
INTRAVENOUS | Status: AC
  Filled 2015-08-09: qty 250

## 2015-08-09 MED ORDER — HEPARIN (PORCINE) IN NACL 2-0.9 UNIT/ML-% IJ SOLN
INTRAMUSCULAR | Status: DC | PRN
  Administered 2015-08-09: 11:00:00

## 2015-08-09 MED ORDER — ACETAMINOPHEN 325 MG PO TABS: 650.0000 mg | ORAL_TABLET | ORAL | Status: DC | PRN

## 2015-08-09 MED ORDER — ONDANSETRON HCL 4 MG/2ML IJ SOLN: 4.0000 mg | Freq: Four times a day (QID) | INTRAMUSCULAR | Status: DC | PRN

## 2015-08-09 MED ORDER — IODIXANOL 320 MG/ML IV SOLN
INTRAVENOUS | Status: DC | PRN
  Administered 2015-08-09: 100 mL via INTRA_ARTERIAL

## 2015-08-09 MED ORDER — TRAMADOL HCL 50 MG PO TABS
50.0000 mg | ORAL_TABLET | Freq: Three times a day (TID) | ORAL | Status: DC | PRN
  Administered 2015-08-09: 13:00:00 50 mg via ORAL
  Filled 2015-08-09: qty 1

## 2015-08-09 MED ORDER — HYDRALAZINE HCL 20 MG/ML IJ SOLN: 10.0000 mg | INTRAMUSCULAR | Status: DC | PRN

## 2015-08-09 MED ORDER — ASPIRIN 81 MG PO CHEW: 81.0000 mg | CHEWABLE_TABLET | ORAL | Status: DC

## 2015-08-09 MED ORDER — FAMOTIDINE 20 MG PO TABS: 20.0000 mg | ORAL_TABLET | Freq: Every day | ORAL | Status: DC

## 2015-08-09 MED ORDER — ASPIRIN EC 325 MG PO TBEC
325.0000 mg | DELAYED_RELEASE_TABLET | Freq: Every day | ORAL | Status: DC
  Filled 2015-08-09: qty 1

## 2015-08-09 MED ORDER — HYDRALAZINE HCL 20 MG/ML IJ SOLN
10.0000 mg | INTRAMUSCULAR | Status: DC | PRN
  Administered 2015-08-09: 14:00:00 10 mg via INTRAVENOUS
  Filled 2015-08-09: qty 1

## 2015-08-09 MED ORDER — VITAMIN B-12 1000 MCG PO TABS
1000.0000 ug | ORAL_TABLET | Freq: Every day | ORAL | Status: DC
  Administered 2015-08-10: 09:00:00 1000 ug via ORAL
  Filled 2015-08-09 (×2): qty 1

## 2015-08-09 MED ORDER — MIDAZOLAM HCL 2 MG/2ML IJ SOLN
INTRAMUSCULAR | Status: AC
  Filled 2015-08-09: qty 2

## 2015-08-09 MED ORDER — HEPARIN (PORCINE) IN NACL 2-0.9 UNIT/ML-% IJ SOLN
INTRAMUSCULAR | Status: AC
  Filled 2015-08-09: qty 1000

## 2015-08-09 MED ORDER — ASPIRIN 81 MG PO TABS: 81.0000 mg | ORAL_TABLET | Freq: Every day | ORAL | Status: DC

## 2015-08-09 MED ORDER — FENTANYL CITRATE (PF) 100 MCG/2ML IJ SOLN
INTRAMUSCULAR | Status: AC
  Filled 2015-08-09: qty 2

## 2015-08-09 MED ORDER — SODIUM CHLORIDE 0.9 % IV SOLN: INTRAVENOUS | Status: AC

## 2015-08-09 MED ORDER — LIDOCAINE HCL (PF) 1 % IJ SOLN
INTRAMUSCULAR | Status: DC | PRN
  Administered 2015-08-09: 30 mL

## 2015-08-09 MED ORDER — NITROGLYCERIN 1 MG/10 ML FOR IR/CATH LAB
INTRA_ARTERIAL | Status: DC | PRN
  Administered 2015-08-09: 200 ug via INTRA_ARTERIAL

## 2015-08-09 SURGICAL SUPPLY — 30 items
BALLN ARMADA 7X20X80 (BALLOONS) ×4
BALLN ARMADA 8X40X80 (BALLOONS) ×4
BALLOON ARMADA 7X20X80 (BALLOONS) IMPLANT
BALLOON ARMADA 8X40X80 (BALLOONS) IMPLANT
CATH CROSS OVER TEMPO 5F (CATHETERS) ×2 IMPLANT
CATH CXI SUPP ANG 2.6FR 150CM (MICROCATHETER) ×2 IMPLANT
CATH SOFT-VU 4F 65 STRAIGHT (CATHETERS) IMPLANT
CATH SOFT-VU STRAIGHT 4F 65CM (CATHETERS) ×4
CATH VIANCE CROSS STAND 150CM (MICROCATHETER) ×4
CATH VIANCE CROSS STD 150CM (MICROCATHETER) IMPLANT
DIAMONDBACK SOLID OAS 2.0MM (CATHETERS) ×4
GUIDEWIRE ASTATO XS 20G 300CM (WIRE) ×2 IMPLANT
KIT ENCORE 26 ADVANTAGE (KITS) ×2 IMPLANT
KIT PV (KITS) ×4 IMPLANT
LUBRICANT VIPERSLIDE CORONARY (MISCELLANEOUS) ×2 IMPLANT
SHEATH BRITE TIP 7FR 35CM (SHEATH) ×2 IMPLANT
SHEATH HIGHFLEX ANSEL 7FR 55CM (SHEATH) ×2 IMPLANT
SHEATH PINNACLE 7F 10CM (SHEATH) ×2 IMPLANT
STENT ABSOLUTE 10X40X135 (Permanent Stent) ×2 IMPLANT
STOPCOCK MORSE 400PSI 3WAY (MISCELLANEOUS) ×2 IMPLANT
SYRINGE MEDRAD AVANTA MACH 7 (SYRINGE) ×2 IMPLANT
SYSTEM DIMNDBCK SLD OAS 2.0MM (CATHETERS) IMPLANT
TAPE RADIOPAQUE TURBO (MISCELLANEOUS) ×2 IMPLANT
TRANSDUCER W/STOPCOCK (MISCELLANEOUS) ×4 IMPLANT
TRAY PV CATH (CUSTOM PROCEDURE TRAY) ×4 IMPLANT
TUBING CIL FLEX 10 FLL-RA (TUBING) ×2 IMPLANT
WIRE HITORQ VERSACORE ST 145CM (WIRE) ×2 IMPLANT
WIRE SPARTACORE .014X300CM (WIRE) ×2 IMPLANT
WIRE VERSACORE LOC 115CM (WIRE) ×2 IMPLANT
WIRE VIPER ADVANCE .017X335CM (WIRE) ×2 IMPLANT

## 2015-08-09 NOTE — Progress Notes (Signed)
Site area: right groin  Site Prior to Removal:  Level 0  Pressure Applied For 20 MINUTES    Minutes Beginning at 1435  Manual:   Yes.    Patient Status During Pull:  AAO X4  Post Pull Groin Site:  Level 0  Post Pull Instructions Given:  Yes.    Post Pull Pulses Present:  Yes.    Dressing Applied:  Yes.    Comments:  Tolerated procedure well

## 2015-08-09 NOTE — Telephone Encounter (Signed)
Rx sent electronically.  

## 2015-08-09 NOTE — H&P (Signed)
Admission Note  08/09/15 BOBAK PYNES  08-04-1945  [id number]  Primary Physician Tillman Abide, MD Primary Cardiologist: Runell Gess MD [name]   HPI: Mr. Phenix is a 70 year old mildly overweight married Caucasian male father of 3, grandfather and 3 grandchildren referred by [name] registered nurse practitioner for evaluation of peripheral arterial disease. His cardiologist is Dr. Dina Rich. He has a history of hypertension and hyperlipidemia. I performed angioplasty of his right coronary artery back in 1994 and he's been a symptomatically since. He stopped smoking at that time. 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 He complains of worsening left greater than right lower extremity claudication. Dopplers performed at [facility] 09/11/13 revealed a right ABI 0.68 and a left ABI 0.59.   Current Outpatient Prescriptions  Medication Sig Dispense Refill  . aspirin 81 MG tablet Take 81 mg by mouth daily.     . chlorthalidone (HYGROTON) 25 MG tablet Take 0.5 tablets (12.5 mg total) by mouth daily. 135 tablet 3  . cimetidine (TAGAMET) 400 MG tablet Take 1 tablet (400 mg total) by mouth 2 (two) times daily as needed. 60 tablet 3  . losartan (COZAAR) 25 MG tablet TAKE 1 TABLET (25 MG TOTAL) BY MOUTH DAILY. 90 tablet 3  . metoprolol succinate (TOPROL-XL) 100 MG 24 hr tablet TAKE ONE TABLET BY MOUTH EVERY DAY 90 tablet 3  . nitroGLYCERIN (NITROSTAT) 0.4 MG SL tablet Place 1 tablet (0.4 mg total) under the tongue every 5 (five) minutes as needed for chest pain. 90 tablet 3  . Omega-3 Fatty Acids (FISH OIL PO) Take by mouth.     . pravastatin (PRAVACHOL) 20 MG tablet Take 1 tablet (20 mg total) by mouth daily. 90 tablet 3  . traMADol (ULTRAM) 50 MG tablet Take 50 mg by mouth every 8 (eight) hours as needed.  1  .  vitamin B-12 (CYANOCOBALAMIN) 1000 MCG tablet Take 1,000 mcg by mouth daily.      No current facility-administered medications for this visit.    Allergies  Allergen Reactions  . Diltiazem Hcl     Social History   Social History  . Marital Status: Married    Spouse Name: N/A  . Number of Children: N/A  . Years of Education: N/A   Occupational History  . 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 Smoking status: Former Smoker -- 1.00 packs/day for 30 years    Quit date: 05/16/1990  . Smokeless tobacco: Never Used     Comment: Quit in 1993  . Alcohol Use: No  . Drug Use: No  . Sexual Activity: Not on file   Other Topics Concern  . Not on file   Social History Narrative   Has living will   Wife is health care POA   Would accept resuscitation but no prolonged artificial ventilation.   No tube feeds if cognitively unaware    Review of Systems: General: negative for chills, fever, night sweats or weight changes.  Cardiovascular: negative for chest pain, dyspnea on exertion, edema, orthopnea, palpitations, paroxysmal nocturnal dyspnea or shortness of breath Dermatological: negative for rash Respiratory: negative for cough or wheezing Urologic: negative for hematuria Abdominal: negative for nausea, vomiting, diarrhea, bright red blood per rectum, melena, or hematemesis Neurologic: negative for visual changes, syncope, or dizziness All other systems reviewed and are otherwise negative except as noted above.  Blood pressure 130/68, pulse 66, height 5\' 10"  (1.778 m), weight 230 lb 3.2 oz (104.418 kg).  General appearance: alert and no distress Neck: no adenopathy, no JVD, supple, symmetrical, trachea midline, thyroid not enlarged, symmetric, no  tenderness/mass/nodules and right carotid bruit Lungs: clear to auscultation bilaterally Heart: regular rate and rhythm, S1, S2 normal, no murmur, click, rub or gallop Extremities: extremities normal, atraumatic, no cyanosis or edema and absent pedal pulses  EKG not performed today  ASSESSMENT AND PLAN:   Peripheral vascular disease [name] was referred to me by [name] nurse practitioner for evaluation of peripheral arterial disease. He has a history of coronary artery disease status post angioplasty of his RCA by myself in 1994. He had Dopplers performed 09/11/13 revealing a right ABI 0.68 and left upper 59 with both SFA and inflow disease. He does complain of left eye limiting claudication left greater than right. I'm going to repeat his lower extremity or till Doppler studies and arrange for him to undergo angiography and potential endovascular therapy.  Runell Gess, M.D., FACP, [facility], [facility] [address] [place], [place]  [address]  [phone] 08/09/2015 9:10 AM

## 2015-08-09 NOTE — Interval H&P Note (Signed)
History and Physical Interval Note:  08/09/2015 9:10 AM  Aaron Sexton  has presented today for surgery, with the diagnosis of claudication  The various methods of treatment have been discussed with the patient and family. After consideration of risks, benefits and other options for treatment, the patient has consented to  Procedure(s): Lower Extremity Angiography (N/A) as a surgical intervention .  The patient's history has been reviewed, patient examined, no change in status, stable for surgery.  I have reviewed the patient's chart and labs.  Questions were answered to the patient's satisfaction.     [name]

## 2015-08-10 DIAGNOSIS — E785 Hyperlipidemia, unspecified: Secondary | ICD-10-CM

## 2015-08-10 DIAGNOSIS — I70213 Atherosclerosis of native arteries of extremities with intermittent claudication, bilateral legs: Secondary | ICD-10-CM | POA: Diagnosis not present

## 2015-08-10 DIAGNOSIS — I739 Peripheral vascular disease, unspecified: Secondary | ICD-10-CM | POA: Diagnosis not present

## 2015-08-10 MED ORDER — CLOPIDOGREL BISULFATE 75 MG PO TABS: 75.0000 mg | ORAL_TABLET | Freq: Every day | ORAL | Status: DC

## 2015-08-10 MED ORDER — ANGIOPLASTY BOOK
Freq: Once | Status: AC
  Administered 2015-08-10: 06:00:00
  Filled 2015-08-10: qty 1

## 2015-08-10 NOTE — Discharge Instructions (Signed)

## 2015-08-10 NOTE — Care Management Note (Signed)
Case Management Note  Patient Details  Name: Aaron Sexton MRN: [id number] Date of Birth: 1946-05-05  Subjective/Objective:  Patient is from home, pta indep, on plavix, no other needs.                 Action/Plan:   Expected Discharge Date:                  Expected Discharge Plan:  Home/Self Care  In-House Referral:     Discharge planning Services  CM Consult  Post Acute Care Choice:    Choice offered to:     DME Arranged:    DME Agency:     HH Arranged:    [place] Agency:     Status of Service:  Completed, signed off  Medicare Important Message Given:    Date Medicare IM Given:    Medicare IM give by:    Date Additional Medicare IM Given:    Additional Medicare Important Message give by:     If discussed at [place] of Stay Meetings, dates discussed:    Additional Comments:  Zenon Mayo, RN 08/10/2015, 9:39 PM

## 2015-08-10 NOTE — Progress Notes (Signed)
Pt up in hall ambulating without difficulty; ambulates independently. No s/s of distress. Denies shortness of breath or chest pain.

## 2015-08-10 NOTE — Discharge Summary (Signed)
Discharge Summary    Patient ID: Aaron Sexton,  MRN: [id number], DOB/AGE: 72-Mar-1947 72 y.o.  Admit date: 08/09/2015 Discharge date: 08/10/2015  Primary Care Provider: Tillman Abide Primary Cardiologist: Dr Wyline Mood PV: Dr Allyson Sabal  Discharge Diagnoses    Primary discharge diagnosis   Peripheral vascular disease [facility])  Active Problems:   Claudication (HCC)   Allergies Allergies  Allergen Reactions  . Diltiazem Hcl Hives and Rash    Diagnostic Studies/Procedures    Peripheral Cath: 08/09/2015 Procedures Performed: 1. Diamondback orbital rotational atherectomy, PTA and stenting right external iliac artery 2. Contralateral access 3. 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 I will see him back in the office in 2-3 weeks for follow-up. That time we will discuss staged right SFA intervention.  _____________   History of Present Illness     70 yo male w/ hx HTN, HLD, PTCA RCA 1994. S/p eval for PAD, ABIs were abnormal and pt scheduled for PV cath. On 08/09/2015.  Hospital Course     Consultants: None   Cath results above. Pt had PTA and stent R-EIA, L-SFA could not be crossed. Tolerated the procedure well.  On 08/10/2015, Aaron Sexton was seen by Dr Delton See and all data were reviewed. His VS were stable and he was having no bleeding issues. He was having no chest pain or shortness of breath. His cath site was without hematoma.  _____________  Discharge Vitals Blood pressure 136/59, pulse 78, temperature 97.5 F (36.4 C), temperature source Oral, resp. rate 21, height 5' 10.5" (1.791 m), weight 223 lb 15.8 oz (101.6 kg), SpO2 93 %.  Filed Weights   08/09/15 0714 08/09/15 1920 08/10/15 0636  Weight: 224 lb (101.606 kg) 223 lb 15.8 oz (101.6 kg) 223 lb 15.8 oz (101.6 kg)  General: Well  developed, well nourished, male in no acute distress Head: Eyes PERRLA, No xanthomas.   Normocephalic and atraumatic  Lungs: Clear bilaterally to auscultation. Heart: HRRR S1 S2, without MRG.  Pulses are 2+ & equal both upper extremities, decreased but palpable in right lower extremity and not palpable in the left lower extremity but the foot is warm and capillary refill is preserved. Very soft bilateral femoral bruits are appreciated. No JVD. Abdomen: Bowel sounds are present, abdomen soft and non-tender without masses or  hernias noted. Msk: Normal strength and tone for age. Extremities: No clubbing, cyanosis or edema. Cath site right groin with minimal ecchymosis, no hematoma  Skin:  No rashes or lesions noted. Neuro: Alert and oriented X 3. Psych:  Good affect, responds appropriately   Labs & Radiologic Studies    NONE   Disposition   Pt is being discharged home today in good condition.  Follow-up Plans & Appointments    Follow-up Information    Follow up with Nanetta Batty, MD On 09/03/2015.   Specialties:  Cardiology, Radiology   Why:  See MD at 10:30, please arrive 15 minutes early for paperwork.   Contact information:   [address] [place] [address] [phone]        Discharge Medications   Current Discharge Medication List    START taking these medications   Details  clopidogrel (PLAVIX) 75 MG tablet Take 1 tablet (75 mg total) by mouth daily with breakfast. Qty: 30 tablet, Refills: 11      CONTINUE these medications which have  NOT CHANGED   Details  aspirin 81 MG tablet Take 81 mg by mouth daily.      chlorthalidone (HYGROTON) 25 MG tablet Take 0.5 tablets (12.5 mg total) by mouth daily. Qty: 135 tablet, Refills: 3    cimetidine (TAGAMET) 400 MG tablet Take 1 tablet (400 mg total) by mouth 2 (two) times daily as needed. Qty: 60 tablet, Refills: 3    metoprolol succinate (TOPROL-XL) 100 MG 24 hr tablet TAKE ONE TABLET BY MOUTH  EVERY DAY Qty: 90 tablet, Refills: 3    Omega-3 Fatty Acids (FISH OIL PO) Take 1,000 mg by mouth daily.     pravastatin (PRAVACHOL) 20 MG tablet Take 1 tablet (20 mg total) by mouth daily. Qty: 90 tablet, Refills: 3    traMADol (ULTRAM) 50 MG tablet Take 50 mg by mouth every 8 (eight) hours as needed for moderate pain.  Refills: 1    vitamin B-12 (CYANOCOBALAMIN) 1000 MCG tablet Take 1,000 mcg by mouth daily.      losartan (COZAAR) 25 MG tablet TAKE ONE TABLET BY MOUTH EVERY DAY Qty: 90 tablet, Refills: 0    nitroGLYCERIN (NITROSTAT) 0.4 MG SL tablet Place 1 tablet (0.4 mg total) under the tongue every 5 (five) minutes as needed for chest pain. Qty: 90 tablet, Refills: 3         Outstanding Labs/Studies   None  Duration of Discharge Encounter   Greater than 30 minutes including physician time.  Melida Quitter NP 08/10/2015, 12:23 PM

## 2015-08-16 ENCOUNTER — Other Ambulatory Visit: Payer: Self-pay | Admitting: Cardiovascular Disease

## 2015-08-16 NOTE — Telephone Encounter (Signed)
°*  STAT* If patient is at the pharmacy, call can be transferred to refill team.   1. Which medications need to be refilled? (please list name of each medication and dose if known) Tramadol 2.Whiich pharmacy/location (including street and city if [employer]) is medication to be sent to?[facility]-[phone]  3. Do they need a 30 day or 90 day supply? 90 and refills

## 2015-08-17 ENCOUNTER — Other Ambulatory Visit: Payer: Self-pay | Admitting: Adult Health

## 2015-08-17 NOTE — Telephone Encounter (Signed)
Message was forwarded to [facility] as patient's wife stated she was going to call that office for refill request.   Encounter closed.

## 2015-08-17 NOTE — Telephone Encounter (Signed)
Spoke with patient's wife. Notified her that Dr. Gwenlyn Found does not refill this medication. She states this medication was refilled by either Dr. Harl Bowie or K. [employer] routed

## 2015-08-23 ENCOUNTER — Telehealth (HOSPITAL_COMMUNITY): Payer: Self-pay | Admitting: Adult Health

## 2015-08-23 ENCOUNTER — Other Ambulatory Visit: Payer: Self-pay

## 2015-08-23 MED ORDER — TRAMADOL HCL 50 MG PO TABS: 50.0000 mg | ORAL_TABLET | Freq: Three times a day (TID) | ORAL | Status: DC | PRN

## 2015-08-23 NOTE — Telephone Encounter (Signed)
Pt is needing someone to call in his Tramadol

## 2015-08-23 NOTE — Telephone Encounter (Signed)
plz phone in. Will refill #20 to last him until appt with PCP later this month and then will need to further discuss with PCP

## 2015-08-23 NOTE — Telephone Encounter (Signed)
Mrs Iocco left v/m requesting refill tramadol for lt leg and knee pain to [facility] [address] Last seen 08/25/14 for annual exam with Dr Silvio Pate. rx was given by Ms Purcell Nails NP who advised pts wife to ck with pts PCP for refill. Pt has CPX appt with Dr Silvio Pate on 09/03/15. Dr Silvio Pate is out of office. Pt is out of med and request refill done today, 08/23/15.Please advise.

## 2015-08-23 NOTE — Telephone Encounter (Signed)
Left refill on voice mail at pharmacy  

## 2015-08-23 NOTE — Telephone Encounter (Signed)
Returned pt call. Let him know he needs to contact his PCP for refills on his tramadol.

## 2015-09-01 ENCOUNTER — Encounter: Payer: Medicare PPO | Admitting: Internal Medicine

## 2015-09-01 ENCOUNTER — Other Ambulatory Visit: Payer: Self-pay | Admitting: Internal Medicine

## 2015-09-01 NOTE — Telephone Encounter (Signed)
Left refill on voice mail at pharmacy  

## 2015-09-01 NOTE — Telephone Encounter (Signed)
Mrs Aaron Sexton signed) left v/m requesting refill tramadol to [facility] [address] Pt will be out of med on 09/03/15. rx last filled # 20 on 08/23/15. Pt cancelled CPX with Dr Silvio Pate on 09/01/15; pt has appt to see Dr Gwenlyn Found on 09/03/15 about blockages in his leg.

## 2015-09-01 NOTE — Telephone Encounter (Signed)
Approved: #20 x 0 

## 2015-09-03 ENCOUNTER — Ambulatory Visit (INDEPENDENT_AMBULATORY_CARE_PROVIDER_SITE_OTHER): Payer: Medicare Other | Admitting: Cardiovascular Disease

## 2015-09-03 ENCOUNTER — Encounter: Payer: Self-pay | Admitting: Cardiovascular Disease

## 2015-09-03 ENCOUNTER — Encounter: Payer: Medicare PPO | Admitting: Internal Medicine

## 2015-09-03 VITALS — BP 136/68 | HR 90 | Ht 70.0 in | Wt 224.0 lb

## 2015-09-03 DIAGNOSIS — I739 Peripheral vascular disease, unspecified: Secondary | ICD-10-CM | POA: Diagnosis not present

## 2015-09-03 DIAGNOSIS — Z01818 Encounter for other preprocedural examination: Secondary | ICD-10-CM | POA: Diagnosis not present

## 2015-09-03 NOTE — Progress Notes (Signed)
09/03/2015 Aaron Sexton   05/06/1946  [id number]  Primary Physician Tillman Abide, MD Primary Cardiologist: Runell Gess MD [name]   HPI:  [name] is a 70 year old mildly overweight married Caucasian male father of 3, grandfather and 3 grandchildren referred by [name] registered nurse practitioner for evaluation of peripheral arterial disease. His cardiologist is Dr. Dina Rich. i last saw him in the office 07/28/15. He has a history of hypertension and hyperlipidemia. I performed angioplasty of his right coronary artery back in 1994 and he's been a symptomatically since. He stopped smoking at that time. 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 He complains of worsening left greater than right lower extremity claudication. Dopplers performed at [facility] 09/11/13 revealed a right ABI 0.68 and a left ABI 0.59. There was a high-frequency signal in the right external iliac artery. Angiography recently performed showed a high-grade calcific//exophytic plaque in the right external iliac artery with a 30 mm gradient. I performed diamond back orbital rotational atherectomy, PTCA and stenting of a highly calcified physiologically significant rightexternal iliac artery stenosis. I failed to cross the highly calcified left SFA CTO however. His right leg is mildly symptomatically improved but he wishes to have his right SFA intervened on.   Current Outpatient Prescriptions  Medication Sig Dispense Refill  . aspirin 81 MG tablet Take 81 mg by mouth daily.      . chlorthalidone (HYGROTON) 25 MG tablet Take 0.5 tablets (12.5 mg total) by mouth daily. 135 tablet 3  . cimetidine (TAGAMET) 400 MG tablet Take 1 tablet (400 mg total) by mouth 2 (two) times daily as needed. (Patient taking differently: Take 400 mg by mouth 2 (two) times daily as needed (indigestion). ) 60 tablet 3  . clopidogrel (PLAVIX) 75 MG tablet Take 1 tablet  (75 mg total) by mouth daily with breakfast. 30 tablet 11  . losartan (COZAAR) 25 MG tablet TAKE ONE TABLET BY MOUTH EVERY DAY 90 tablet 0  . metoprolol succinate (TOPROL-XL) 100 MG 24 hr tablet TAKE ONE TABLET BY MOUTH EVERY DAY 90 tablet 3  . nitroGLYCERIN (NITROSTAT) 0.4 MG SL tablet Place 1 tablet (0.4 mg total) under the tongue every 5 (five) minutes as needed for chest pain. 90 tablet 3  . Omega-3 Fatty Acids (FISH OIL PO) Take 1,000 mg by mouth daily.     . pravastatin (PRAVACHOL) 20 MG tablet Take 1 tablet (20 mg total) by mouth daily. 90 tablet 3  . traMADol (ULTRAM) 50 MG tablet TAKE 1 TABLET BY MOUTH EVERY 8 HOURS AS NEEDED FOR MODERATE PAIN 20 tablet 0  . vitamin B-12 (CYANOCOBALAMIN) 1000 MCG tablet Take 1,000 mcg by mouth daily.       No current facility-administered medications for this visit.    Allergies  Allergen Reactions  . Diltiazem Hcl Hives and Rash    Social History   Social History  . Marital Status: Married    Spouse Name: N/A  . Number of Children: N/A  . Years of Education: N/A   Occupational History  . 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 Smoking status: Former Smoker -- 1.00 packs/day for 30 years    Quit date: 05/16/1990  . Smokeless tobacco: Never Used     Comment: Quit in 1993  . Alcohol Use: No  . Drug Use: No  . Sexual Activity:  Not on file   Other Topics Concern  . Not on file   Social History Narrative   Has living will   Wife is health care POA   Would accept resuscitation but no prolonged artificial ventilation.   No tube feeds if cognitively unaware     Review of Systems: General: negative for chills, fever, night sweats or weight changes.  Cardiovascular: negative for chest pain, dyspnea on exertion, edema, orthopnea, palpitations, paroxysmal nocturnal dyspnea or shortness of breath Dermatological: negative for rash Respiratory: negative for cough or  wheezing Urologic: negative for hematuria Abdominal: negative for nausea, vomiting, diarrhea, bright red blood per rectum, melena, or hematemesis Neurologic: negative for visual changes, syncope, or dizziness All other systems reviewed and are otherwise negative except as noted above.    Blood pressure 136/68, pulse 90, height 5\' 10"  (1.778 m), weight 224 lb (101.606 kg).  General appearance: alert and no distress Neck: no adenopathy, no carotid bruit, no JVD, supple, symmetrical, trachea midline and thyroid not enlarged, symmetric, no tenderness/mass/nodules Lungs: clear to auscultation bilaterally Heart: regular rate and rhythm, S1, S2 normal, no murmur, click, rub or gallop Extremities: extremities normal, atraumatic, no cyanosis or edema  EKG not performed today  ASSESSMENT AND PLAN:   Peripheral vascular disease [facility]) [name] has known occluded SFAs bilaterally. His left SFA is highly calcified. I recently performed diamondback orbital rotational atherectomy, PTCA and stenting of a highly calcified right external iliac artery stenosis and during the same sitting attempted to cross the left SFA unsuccessfully. He says that his right leg feels somewhat improved though he still has lifestyle including claudication. He wishes to proceed with attempt at right SFA revascularization. If this is successful he will need referral to [facility] for Hoy Finlay to do retrograde access attempt at left SFA intervention.      Runell Gess MD FACP,FACC,FAHA, [facility] 09/03/2015 11:27 AM

## 2015-09-03 NOTE — Patient Instructions (Signed)
Medication Instructions:  Your physician recommends that you continue on your current medications as directed. Please refer to the Current Medication list given to you today.    Testing/Procedures: Dr. Gwenlyn Found has ordered a peripheral angiogram to be done at [facility].  This procedure is going to look at the bloodflow in your lower extremities.  If Dr. Gwenlyn Found is able to open up the arteries, you will have to spend one night in the hospital.  If he is not able to open the arteries, you will be able to go home that same day.  SCHEDULED FOR [facility] 5/15  After the procedure, you will not be allowed to drive for 3 days or push, pull, or lift anything greater than 10 lbs for one week.    You will be required to have the following tests prior to the procedure:  1. Blood work-the blood work can be done no more than 7 days prior to the procedure.  It can be done at any [facility] lab.  There is one downstairs on the first floor of this building and one in the [facility] building [phone] N. [address])    *REPS: Scott  Puncture site: Left Groin     Any Other Special Instructions Will Be Listed Below (If Applicable).     If you need a refill on your cardiac medications before your next appointment, please call your pharmacy.

## 2015-09-03 NOTE — Assessment & Plan Note (Signed)
[name] has known occluded SFAs bilaterally. His left SFA is highly calcified. I recently performed diamondback orbital rotational atherectomy, PTCA and stenting of a highly calcified right external iliac artery stenosis and during the same sitting attempted to cross the left SFA unsuccessfully. He says that his right leg feels somewhat improved though he still has lifestyle including claudication. He wishes to proceed with attempt at right SFA revascularization. If this is successful he will need referral to [facility] for [name] to do retrograde access attempt at left SFA intervention.

## 2015-09-13 ENCOUNTER — Other Ambulatory Visit: Payer: Self-pay | Admitting: Internal Medicine

## 2015-09-13 NOTE — Telephone Encounter (Signed)
Approved: #20 x 0 If he needs ongoing meds, will need to see him before June

## 2015-09-13 NOTE — Telephone Encounter (Signed)
Spoke to patient. He is having surgery on Monday on his arteries in his legs. Says he takes the tramadol for the leg pain. He said he is used to getting #90 of Tramadol. He would rather not come in for a visit since he is due to come in June.

## 2015-09-13 NOTE — Telephone Encounter (Signed)
Pt left v/m requesting cb about tramadol refill.

## 2015-09-13 NOTE — Telephone Encounter (Signed)
Last filled 09-01-15 #20 Last OV 09-03-15 Next OV 11-03-15

## 2015-09-14 NOTE — Telephone Encounter (Signed)
The circulation problems shouldn't be causing pain--other than with walking. I have not prescribed this for him before and will not give that many without a discussion (we can have that discussion when he comes in for his yearly)

## 2015-09-14 NOTE — Telephone Encounter (Signed)
Spoke to patient. He said he will discuss it with you at the office visit next month. Left refill on voice mail at pharmacy

## 2015-09-16 LAB — PROTIME-INR
INR: 1.08 (ref <1.50)
PROTHROMBIN TIME: 14.1 s (ref 11.6–15.2)

## 2015-09-16 LAB — CBC WITH DIFFERENTIAL/PLATELET
BASOS PCT: 0 %
Basophils Absolute: 0 cells/uL (ref 0–200)
EOS PCT: 3 %
Eosinophils Absolute: 294 cells/uL (ref 15–500)
HCT: 35.8 % — ABNORMAL LOW (ref 38.5–50.0)
Hemoglobin: 11.7 g/dL — ABNORMAL LOW (ref 13.2–17.1)
LYMPHS PCT: 29 %
Lymphs Abs: 2842 cells/uL (ref 850–3900)
MCH: 36.4 pg — ABNORMAL HIGH (ref 27.0–33.0)
MCHC: 32.7 g/dL (ref 32.0–36.0)
MCV: 111.5 fL — ABNORMAL HIGH (ref 80.0–100.0)
MPV: 9.3 fL (ref 7.5–12.5)
Monocytes Absolute: 686 cells/uL (ref 200–950)
Monocytes Relative: 7 %
NEUTROS PCT: 61 %
Neutro Abs: 5978 cells/uL (ref 1500–7800)
PLATELETS: 306 10*3/uL (ref 140–400)
RBC: 3.21 MIL/uL — AB (ref 4.20–5.80)
RDW: 14.5 % (ref 11.0–15.0)
WBC: 9.8 10*3/uL (ref 3.8–10.8)

## 2015-09-16 LAB — BASIC METABOLIC PANEL
BUN: 21 mg/dL (ref 7–25)
CHLORIDE: 103 mmol/L (ref 98–110)
CO2: 25 mmol/L (ref 20–31)
Calcium: 9.2 mg/dL (ref 8.6–10.3)
Creat: 0.91 mg/dL (ref 0.70–1.18)
Glucose, Bld: 93 mg/dL (ref 65–99)
POTASSIUM: 4 mmol/L (ref 3.5–5.3)
Sodium: 137 mmol/L (ref 135–146)

## 2015-09-16 LAB — APTT: APTT: 34 s (ref 24–37)

## 2015-09-18 ENCOUNTER — Other Ambulatory Visit: Payer: Self-pay

## 2015-09-18 DIAGNOSIS — I739 Peripheral vascular disease, unspecified: Secondary | ICD-10-CM

## 2015-09-20 ENCOUNTER — Encounter (HOSPITAL_COMMUNITY)
Admission: RE | Disposition: A | Discharge status: Home / Self Care | Payer: Self-pay | Source: Ambulatory Visit | Attending: Cardiovascular Disease

## 2015-09-20 ENCOUNTER — Ambulatory Visit (HOSPITAL_COMMUNITY)
Admission: RE | Admit: 2015-09-20 | Discharge: 2015-09-20 | Disposition: A | Discharge status: Home / Self Care | Payer: Medicare Other | Source: Ambulatory Visit | Attending: Cardiovascular Disease | Admitting: Cardiovascular Disease

## 2015-09-20 DIAGNOSIS — E785 Hyperlipidemia, unspecified: Secondary | ICD-10-CM | POA: Insufficient documentation

## 2015-09-20 DIAGNOSIS — E663 Overweight: Secondary | ICD-10-CM | POA: Diagnosis not present

## 2015-09-20 DIAGNOSIS — Z87891 Personal history of nicotine dependence: Secondary | ICD-10-CM | POA: Diagnosis not present

## 2015-09-20 DIAGNOSIS — I70211 Atherosclerosis of native arteries of extremities with intermittent claudication, right leg: Secondary | ICD-10-CM

## 2015-09-20 DIAGNOSIS — Z7982 Long term (current) use of aspirin: Secondary | ICD-10-CM | POA: Insufficient documentation

## 2015-09-20 DIAGNOSIS — Z955 Presence of coronary angioplasty implant and graft: Secondary | ICD-10-CM | POA: Diagnosis not present

## 2015-09-20 DIAGNOSIS — I7092 Chronic total occlusion of artery of the extremities: Secondary | ICD-10-CM | POA: Insufficient documentation

## 2015-09-20 DIAGNOSIS — Z7902 Long term (current) use of antithrombotics/antiplatelets: Secondary | ICD-10-CM | POA: Diagnosis not present

## 2015-09-20 DIAGNOSIS — I739 Peripheral vascular disease, unspecified: Secondary | ICD-10-CM | POA: Diagnosis present

## 2015-09-20 DIAGNOSIS — Z6831 Body mass index (BMI) 31.0-31.9, adult: Secondary | ICD-10-CM | POA: Insufficient documentation

## 2015-09-20 DIAGNOSIS — I2510 Atherosclerotic heart disease of native coronary artery without angina pectoris: Secondary | ICD-10-CM | POA: Insufficient documentation

## 2015-09-20 DIAGNOSIS — I10 Essential (primary) hypertension: Secondary | ICD-10-CM | POA: Diagnosis not present

## 2015-09-20 HISTORY — PX: PERIPHERAL VASCULAR CATHETERIZATION: SHX172C

## 2015-09-20 LAB — POCT ACTIVATED CLOTTING TIME: ACTIVATED CLOTTING TIME: 167 s

## 2015-09-20 SURGERY — LOWER EXTREMITY ANGIOGRAPHY
Anesthesia: LOCAL | Laterality: Right

## 2015-09-20 MED ORDER — SODIUM CHLORIDE 0.9 % WEIGHT BASED INFUSION
3.0000 mL/kg/h | INTRAVENOUS | Status: DC
  Administered 2015-09-20: 3 mL/kg/h via INTRAVENOUS

## 2015-09-20 MED ORDER — ACETAMINOPHEN 325 MG PO TABS: 650.0000 mg | ORAL_TABLET | ORAL | Status: DC | PRN

## 2015-09-20 MED ORDER — FENTANYL CITRATE (PF) 100 MCG/2ML IJ SOLN
INTRAMUSCULAR | Status: DC | PRN
  Administered 2015-09-20: 25 ug via INTRAVENOUS

## 2015-09-20 MED ORDER — ASPIRIN 81 MG PO CHEW
81.0000 mg | CHEWABLE_TABLET | ORAL | Status: DC
Start: 2015-09-21 — End: 2015-09-20

## 2015-09-20 MED ORDER — SODIUM CHLORIDE 0.9 % IV SOLN: INTRAVENOUS | Status: DC

## 2015-09-20 MED ORDER — FENTANYL CITRATE (PF) 100 MCG/2ML IJ SOLN
INTRAMUSCULAR | Status: AC
  Filled 2015-09-20: qty 2

## 2015-09-20 MED ORDER — IODIXANOL 320 MG/ML IV SOLN
INTRAVENOUS | Status: DC | PRN
  Administered 2015-09-20: 125 mL via INTRA_ARTERIAL

## 2015-09-20 MED ORDER — ONDANSETRON HCL 4 MG/2ML IJ SOLN: 4.0000 mg | Freq: Four times a day (QID) | INTRAMUSCULAR | Status: DC | PRN

## 2015-09-20 MED ORDER — HEPARIN (PORCINE) IN NACL 2-0.9 UNIT/ML-% IJ SOLN
INTRAMUSCULAR | Status: AC
  Filled 2015-09-20: qty 1000

## 2015-09-20 MED ORDER — HEPARIN SODIUM (PORCINE) 1000 UNIT/ML IJ SOLN
INTRAMUSCULAR | Status: AC
  Filled 2015-09-20: qty 1

## 2015-09-20 MED ORDER — HEPARIN SODIUM (PORCINE) 1000 UNIT/ML IJ SOLN
INTRAMUSCULAR | Status: DC | PRN
  Administered 2015-09-20: 6000 [IU] via INTRAVENOUS
  Administered 2015-09-20: 2500 [IU] via INTRAVENOUS

## 2015-09-20 MED ORDER — TRAMADOL HCL 50 MG PO TABS
ORAL_TABLET | ORAL | Status: AC
  Filled 2015-09-20: qty 1

## 2015-09-20 MED ORDER — MIDAZOLAM HCL 2 MG/2ML IJ SOLN
INTRAMUSCULAR | Status: AC
  Filled 2015-09-20: qty 2

## 2015-09-20 MED ORDER — TRAMADOL HCL 50 MG PO TABS
50.0000 mg | ORAL_TABLET | Freq: Once | ORAL | Status: AC
  Administered 2015-09-20: 50 mg via ORAL

## 2015-09-20 MED ORDER — MIDAZOLAM HCL 2 MG/2ML IJ SOLN
INTRAMUSCULAR | Status: DC | PRN
  Administered 2015-09-20: 1 mg via INTRAVENOUS

## 2015-09-20 MED ORDER — HEPARIN (PORCINE) IN NACL 2-0.9 UNIT/ML-% IJ SOLN
INTRAMUSCULAR | Status: DC | PRN
  Administered 2015-09-20: 1000 mL

## 2015-09-20 MED ORDER — SODIUM CHLORIDE 0.9% FLUSH: 3.0000 mL | INTRAVENOUS | Status: DC | PRN

## 2015-09-20 MED ORDER — SODIUM CHLORIDE 0.9 % WEIGHT BASED INFUSION: 1.0000 mL/kg/h | INTRAVENOUS | Status: DC

## 2015-09-20 MED ORDER — LIDOCAINE HCL (PF) 1 % IJ SOLN
INTRAMUSCULAR | Status: DC | PRN
  Administered 2015-09-20: 25 mL

## 2015-09-20 MED ORDER — LIDOCAINE HCL (PF) 1 % IJ SOLN
INTRAMUSCULAR | Status: AC
  Filled 2015-09-20: qty 30

## 2015-09-20 MED ORDER — MORPHINE SULFATE (PF) 2 MG/ML IV SOLN: 2.0000 mg | INTRAVENOUS | Status: DC | PRN

## 2015-09-20 SURGICAL SUPPLY — 16 items
CATH CROSS OVER TEMPO 5F (CATHETERS) ×1 IMPLANT
CATH VIANCE CROSS STAND 150CM (MICROCATHETER) ×3
CATH VIANCE CROSS STD 150CM (MICROCATHETER) IMPLANT
GUIDEWIRE ASTATO XS 20G 300CM (WIRE) ×1 IMPLANT
KIT ENCORE 26 ADVANTAGE (KITS) ×1 IMPLANT
KIT PV (KITS) ×3 IMPLANT
SHEATH HIGHFLEX ANSEL 7FR 55CM (SHEATH) ×1 IMPLANT
SHEATH PINNACLE 5F 10CM (SHEATH) ×1 IMPLANT
SHEATH PINNACLE 7F 10CM (SHEATH) ×1 IMPLANT
STOPCOCK MORSE 400PSI 3WAY (MISCELLANEOUS) ×1 IMPLANT
SYRINGE MEDRAD AVANTA MACH 7 (SYRINGE) ×1 IMPLANT
TAPE RADIOPAQUE TURBO (MISCELLANEOUS) ×1 IMPLANT
TRANSDUCER W/STOPCOCK (MISCELLANEOUS) ×3 IMPLANT
TRAY PV CATH (CUSTOM PROCEDURE TRAY) ×3 IMPLANT
TUBING CIL FLEX 10 FLL-RA (TUBING) ×1 IMPLANT
WIRE HITORQ VERSACORE ST 145CM (WIRE) ×1 IMPLANT

## 2015-09-20 NOTE — Discharge Instructions (Signed)

## 2015-09-20 NOTE — Interval H&P Note (Signed)
History and Physical Interval Note:  09/20/2015 8:51 AM  Aaron Sexton  has presented today for surgery, with the diagnosis of pvd  The various methods of treatment have been discussed with the patient and family. After consideration of risks, benefits and other options for treatment, the patient has consented to  Procedure(s): Lower Extremity Angiography (N/A) as a surgical intervention .  The patient's history has been reviewed, patient examined, no change in status, stable for surgery.  I have reviewed the patient's chart and labs.  Questions were answered to the patient's satisfaction.     [name]

## 2015-09-20 NOTE — H&P (View-Only) (Signed)
09/03/2015 Aaron Sexton   05/06/1946  [id number]  Primary Physician Tillman Abide, MD Primary Cardiologist: Runell Gess MD [name]   HPI:  Aaron Sexton is a 70 year old mildly overweight married Caucasian male father of 3, grandfather and 3 grandchildren referred by [name] registered nurse practitioner for evaluation of peripheral arterial disease. His cardiologist is Dr. Dina Rich. i last saw him in the office 07/28/15. He has a history of hypertension and hyperlipidemia. I performed angioplasty of his right coronary artery back in 1994 and he's been a symptomatically since. He stopped smoking at that time. 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 He complains of worsening left greater than right lower extremity claudication. Dopplers performed at [facility] 09/11/13 revealed a right ABI 0.68 and a left ABI 0.59. There was a high-frequency signal in the right external iliac artery. Angiography recently performed showed a high-grade calcific//exophytic plaque in the right external iliac artery with a 30 mm gradient. I performed diamond back orbital rotational atherectomy, PTCA and stenting of a highly calcified physiologically significant rightexternal iliac artery stenosis. I failed to cross the highly calcified left SFA CTO however. His right leg is mildly symptomatically improved but he wishes to have his right SFA intervened on.   Current Outpatient Prescriptions  Medication Sig Dispense Refill  . aspirin 81 MG tablet Take 81 mg by mouth daily.      . chlorthalidone (HYGROTON) 25 MG tablet Take 0.5 tablets (12.5 mg total) by mouth daily. 135 tablet 3  . cimetidine (TAGAMET) 400 MG tablet Take 1 tablet (400 mg total) by mouth 2 (two) times daily as needed. (Patient taking differently: Take 400 mg by mouth 2 (two) times daily as needed (indigestion). ) 60 tablet 3  . clopidogrel (PLAVIX) 75 MG tablet Take 1 tablet  (75 mg total) by mouth daily with breakfast. 30 tablet 11  . losartan (COZAAR) 25 MG tablet TAKE ONE TABLET BY MOUTH EVERY DAY 90 tablet 0  . metoprolol succinate (TOPROL-XL) 100 MG 24 hr tablet TAKE ONE TABLET BY MOUTH EVERY DAY 90 tablet 3  . nitroGLYCERIN (NITROSTAT) 0.4 MG SL tablet Place 1 tablet (0.4 mg total) under the tongue every 5 (five) minutes as needed for chest pain. 90 tablet 3  . Omega-3 Fatty Acids (FISH OIL PO) Take 1,000 mg by mouth daily.     . pravastatin (PRAVACHOL) 20 MG tablet Take 1 tablet (20 mg total) by mouth daily. 90 tablet 3  . traMADol (ULTRAM) 50 MG tablet TAKE 1 TABLET BY MOUTH EVERY 8 HOURS AS NEEDED FOR MODERATE PAIN 20 tablet 0  . vitamin B-12 (CYANOCOBALAMIN) 1000 MCG tablet Take 1,000 mcg by mouth daily.       No current facility-administered medications for this visit.    Allergies  Allergen Reactions  . Diltiazem Hcl Hives and Rash    Social History   Social History  . Marital Status: Married    Spouse Name: N/A  . Number of Children: N/A  . Years of Education: N/A   Occupational History  . 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 Smoking status: Former Smoker -- 1.00 packs/day for 30 years    Quit date: 05/16/1990  . Smokeless tobacco: Never Used     Comment: Quit in 1993  . Alcohol Use: No  . Drug Use: No  . Sexual Activity:  Not on file   Other Topics Concern  . Not on file   Social History Narrative   Has living will   Wife is health care POA   Would accept resuscitation but no prolonged artificial ventilation.   No tube feeds if cognitively unaware     Review of Systems: General: negative for chills, fever, night sweats or weight changes.  Cardiovascular: negative for chest pain, dyspnea on exertion, edema, orthopnea, palpitations, paroxysmal nocturnal dyspnea or shortness of breath Dermatological: negative for rash Respiratory: negative for cough or  wheezing Urologic: negative for hematuria Abdominal: negative for nausea, vomiting, diarrhea, bright red blood per rectum, melena, or hematemesis Neurologic: negative for visual changes, syncope, or dizziness All other systems reviewed and are otherwise negative except as noted above.    Blood pressure 136/68, pulse 90, height 5\' 10"  (1.778 m), weight 224 lb (101.606 kg).  General appearance: alert and no distress Neck: no adenopathy, no carotid bruit, no JVD, supple, symmetrical, trachea midline and thyroid not enlarged, symmetric, no tenderness/mass/nodules Lungs: clear to auscultation bilaterally Heart: regular rate and rhythm, S1, S2 normal, no murmur, click, rub or gallop Extremities: extremities normal, atraumatic, no cyanosis or edema  EKG not performed today  ASSESSMENT AND PLAN:   Peripheral vascular disease [facility]) [name] has known occluded SFAs bilaterally. His left SFA is highly calcified. I recently performed diamondback orbital rotational atherectomy, PTCA and stenting of a highly calcified right external iliac artery stenosis and during the same sitting attempted to cross the left SFA unsuccessfully. He says that his right leg feels somewhat improved though he still has lifestyle including claudication. He wishes to proceed with attempt at right SFA revascularization. If this is successful he will need referral to [facility] for Hoy Finlay to do retrograde access attempt at left SFA intervention.      Runell Gess MD FACP,FACC,FAHA, [facility] 09/03/2015 11:27 AM

## 2015-09-20 NOTE — Progress Notes (Signed)
Removal of Left arterial femoral sheath without complication by C. Tierre Gerard. Left groin stable with no oozing, bruising or hematoma present. Distal pulses present and unchanged from prior assessment. Pulses present with doppler. Sterile dressing applied to insertion site with sterile 4 x 4 and tegaderm to secure. Dressing dry and intact. BP: 108/64, HR: 68, SP02: 98%.

## 2015-09-21 ENCOUNTER — Other Ambulatory Visit: Payer: Self-pay | Admitting: Internal Medicine

## 2015-09-21 ENCOUNTER — Encounter (HOSPITAL_COMMUNITY): Payer: Self-pay | Admitting: Cardiovascular Disease

## 2015-09-21 ENCOUNTER — Other Ambulatory Visit: Payer: Self-pay

## 2015-09-21 LAB — POCT ACTIVATED CLOTTING TIME
Activated Clotting Time: 209 seconds
Activated Clotting Time: 188 seconds

## 2015-09-21 NOTE — Telephone Encounter (Signed)
Aaron Sexton left v/m requesting refill for tramadol to [facility] [address]. Pt had angiogram on 09/20/15 on rt leg and requesting pain med.Aaron Sexton request cb. Pt has CPX scheduled on 11/03/15.

## 2015-09-22 MED ORDER — TRAMADOL HCL 50 MG PO TABS: 50.0000 mg | ORAL_TABLET | Freq: Three times a day (TID) | ORAL | Status: DC

## 2015-09-22 NOTE — Telephone Encounter (Signed)
Left refill on voice mail at pharmacy  

## 2015-09-22 NOTE — Telephone Encounter (Signed)
Approved: #20 x 0 

## 2015-10-05 ENCOUNTER — Other Ambulatory Visit: Payer: Self-pay | Admitting: Internal Medicine

## 2015-10-05 NOTE — Telephone Encounter (Signed)
Approved: #20 x 0 

## 2015-10-05 NOTE — Telephone Encounter (Signed)
Last filled 09-12-15 #20 Next OV 11-03-15. Pt was advised earlier in the month that he needed to be seen to discuss the medication and he had said he has an appt in June and would discuss it then, but it is not until the end of the month

## 2015-10-05 NOTE — Telephone Encounter (Signed)
Left refill on voice mail at pharmacy  

## 2015-10-15 ENCOUNTER — Ambulatory Visit (INDEPENDENT_AMBULATORY_CARE_PROVIDER_SITE_OTHER): Payer: Medicare Other | Admitting: Cardiovascular Disease

## 2015-10-15 ENCOUNTER — Encounter: Payer: Self-pay | Admitting: Cardiovascular Disease

## 2015-10-15 VITALS — BP 122/60 | HR 74 | Ht 70.0 in | Wt 220.8 lb

## 2015-10-15 DIAGNOSIS — I739 Peripheral vascular disease, unspecified: Secondary | ICD-10-CM

## 2015-10-15 NOTE — Progress Notes (Signed)
10/15/2015 Aaron Sexton   1945-12-10  [id number]  Primary Physician Tillman Abide, MD Primary Cardiologist: Runell Gess MD [name]  HPI:  Aaron Sexton is a 70 year old mildly overweight married Caucasian male father of 3, grandfather and 3 grandchildren referred by [name] registered nurse practitioner for evaluation of peripheral arterial disease. His cardiologist is Dr. Dina Rich. i last saw him in the office 09/03/15. He has a history of hypertension and hyperlipidemia. I performed angioplasty of his right coronary artery back in 1994 and he's been a symptomatically since. He stopped smoking at that time. 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 He complains of worsening left greater than right lower extremity claudication. Dopplers performed at [facility] 09/11/13 revealed a right ABI 0.68 and a left ABI 0.59. There was a high-frequency signal in the right external iliac artery. Angiography recently performed showed a high-grade calcific//exophytic plaque in the right external iliac artery with a 30 mm gradient. I performed diamond back orbital rotational atherectomy, PTCA and stenting of a highly calcified physiologically significant rightexternal iliac artery stenosis. I failed to cross the highly calcified left SFA CTO however.I attempted right SFA intervention 09/20/15 unsuccessfully He does have mild lifestyle limiting claudication but this is not severe.  Current Outpatient Prescriptions  Medication Sig Dispense Refill  . aspirin 81 MG tablet Take 81 mg by mouth daily.      . chlorthalidone (HYGROTON) 25 MG tablet Take 0.5 tablets (12.5 mg total) by mouth daily. 135 tablet 3  . cimetidine (TAGAMET) 400 MG tablet Take 1 tablet (400 mg total) by mouth 2 (two) times daily as needed. (Patient taking differently: Take 400 mg by mouth 2 (two) times daily as needed (indigestion). ) 60 tablet 3  . clopidogrel (PLAVIX) 75  MG tablet Take 1 tablet (75 mg total) by mouth daily with breakfast. 30 tablet 11  . losartan (COZAAR) 25 MG tablet TAKE ONE TABLET BY MOUTH EVERY DAY 90 tablet 0  . metoprolol succinate (TOPROL-XL) 100 MG 24 hr tablet TAKE ONE TABLET BY MOUTH EVERY DAY 90 tablet 3  . nitroGLYCERIN (NITROSTAT) 0.4 MG SL tablet Place 1 tablet (0.4 mg total) under the tongue every 5 (five) minutes as needed for chest pain. 90 tablet 3  . Omega-3 Fatty Acids (FISH OIL PO) Take 1,000 mg by mouth daily.     . pravastatin (PRAVACHOL) 20 MG tablet Take 1 tablet (20 mg total) by mouth daily. 90 tablet 3  . traMADol (ULTRAM) 50 MG tablet TAKE 1 TABLET BY MOUTH EVERY 8 HOURS AS NEEDED FOR PAIN 20 tablet 0  . vitamin B-12 (CYANOCOBALAMIN) 1000 MCG tablet Take 1,000 mcg by mouth daily.       No current facility-administered medications for this visit.    Allergies  Allergen Reactions  . Diltiazem Hcl Hives and Rash    Social History   Social History  . Marital Status: Married    Spouse Name: N/A  . Number of Children: N/A  . Years of Education: N/A   Occupational History  . 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 Smoking status: Former Smoker -- 1.00 packs/day for 30 years    Quit date: 05/16/1990  . Smokeless tobacco: Never Used     Comment: Quit in 1993  . Alcohol Use: No  . Drug Use: No  . Sexual Activity:  Not on file   Other Topics Concern  . Not on file   Social History Narrative   Has living will   Wife is health care POA   Would accept resuscitation but no prolonged artificial ventilation.   No tube feeds if cognitively unaware     Review of Systems: General: negative for chills, fever, night sweats or weight changes.  Cardiovascular: negative for chest pain, dyspnea on exertion, edema, orthopnea, palpitations, paroxysmal nocturnal dyspnea or shortness of breath Dermatological: negative for rash Respiratory: negative for  cough or wheezing Urologic: negative for hematuria Abdominal: negative for nausea, vomiting, diarrhea, bright red blood per rectum, melena, or hematemesis Neurologic: negative for visual changes, syncope, or dizziness All other systems reviewed and are otherwise negative except as noted above.    Blood pressure 122/60, pulse 74, height 5\' 10"  (1.778 m), weight 220 lb 12.8 oz (100.154 kg).  General appearance: alert and no distress Neck: no adenopathy, no carotid bruit, no JVD, supple, symmetrical, trachea midline and thyroid not enlarged, symmetric, no tenderness/mass/nodules Lungs: clear to auscultation bilaterally Heart: regular rate and rhythm, S1, S2 normal, no murmur, click, rub or gallop Extremities: extremities normal, atraumatic, no cyanosis or edema  EKG normal sinus rhythm at 74 with nonspecific ST and T-wave changes. I personally reviewed this EKG  ASSESSMENT AND PLAN:   Peripheral vascular disease [facility]) Aaron Sexton returns today after his recent failed attempt at right SFA percutaneous vascular patient which I performed 09/20/15. He did have a right external iliac artery stent prior to that. He had fell attempt at left SFA intervention as well He has known occluded SFAs bilaterally. He does have mild limitation of his daily activities related to claudication but not severely so. Should he wish his SFAs to be revascularized have offered referral to [facility] which Dr. Hoy Finlay may have better luck performing retrograde approach.      Runell Gess MD FACP,FACC,FAHA, [facility] 10/15/2015 12:32 PM

## 2015-10-15 NOTE — Patient Instructions (Signed)

## 2015-10-15 NOTE — Assessment & Plan Note (Signed)
Aaron Sexton returns today after his recent failed attempt at right SFA percutaneous vascular patient which I performed 09/20/15. He did have a right external iliac artery stent prior to that. He had fell attempt at left SFA intervention as well He has known occluded SFAs bilaterally. He does have mild limitation of his daily activities related to claudication but not severely so. Should he wish his SFAs to be revascularized have offered referral to [facility] which Dr. [name] may have better luck performing retrograde approach.

## 2015-10-27 ENCOUNTER — Other Ambulatory Visit: Payer: Self-pay | Admitting: Internal Medicine

## 2015-10-27 NOTE — Telephone Encounter (Signed)
Approved: #20 x 0 

## 2015-10-27 NOTE — Telephone Encounter (Signed)
Left refill on voice mail at pharmacy  

## 2015-10-27 NOTE — Telephone Encounter (Signed)
Last filled 10-05-15 #20 Last OV 08-25-14 Next OV 11-03-15

## 2015-11-03 ENCOUNTER — Encounter: Payer: Self-pay | Admitting: Internal Medicine

## 2015-11-03 ENCOUNTER — Ambulatory Visit (INDEPENDENT_AMBULATORY_CARE_PROVIDER_SITE_OTHER): Payer: Medicare Other | Admitting: Internal Medicine

## 2015-11-03 VITALS — BP 130/68 | HR 74 | Temp 97.3°F | Ht 67.75 in | Wt 220.0 lb

## 2015-11-03 DIAGNOSIS — Z0000 Encounter for general adult medical examination without abnormal findings: Secondary | ICD-10-CM | POA: Diagnosis not present

## 2015-11-03 DIAGNOSIS — Z7189 Other specified counseling: Secondary | ICD-10-CM

## 2015-11-03 DIAGNOSIS — I2510 Atherosclerotic heart disease of native coronary artery without angina pectoris: Secondary | ICD-10-CM

## 2015-11-03 DIAGNOSIS — I739 Peripheral vascular disease, unspecified: Secondary | ICD-10-CM | POA: Diagnosis not present

## 2015-11-03 DIAGNOSIS — D126 Benign neoplasm of colon, unspecified: Secondary | ICD-10-CM

## 2015-11-03 DIAGNOSIS — I779 Disorder of arteries and arterioles, unspecified: Secondary | ICD-10-CM | POA: Insufficient documentation

## 2015-11-03 LAB — COMPREHENSIVE METABOLIC PANEL
ALBUMIN: 4.3 g/dL (ref 3.5–5.2)
ALK PHOS: 42 U/L (ref 39–117)
ALT: 20 U/L (ref 0–53)
AST: 18 U/L (ref 0–37)
BILIRUBIN TOTAL: 0.4 mg/dL (ref 0.2–1.2)
BUN: 15 mg/dL (ref 6–23)
CO2: 29 mEq/L (ref 19–32)
CREATININE: 0.83 mg/dL (ref 0.40–1.50)
Calcium: 9.4 mg/dL (ref 8.4–10.5)
Chloride: 101 mEq/L (ref 96–112)
GFR: 96.91 mL/min (ref >60.00)
GLUCOSE: 97 mg/dL (ref 70–99)
Potassium: 3.8 mEq/L (ref 3.5–5.1)
SODIUM: 136 meq/L (ref 135–145)
TOTAL PROTEIN: 7.8 g/dL (ref 6.0–8.3)

## 2015-11-03 LAB — LIPID PANEL
CHOLESTEROL: 80 mg/dL (ref 0–200)
HDL: 47.8 mg/dL (ref >39.00)
LDL Cholesterol: 21 mg/dL (ref 0–99)
NONHDL: 31.73
Total CHOL/HDL Ratio: 2
Triglycerides: 56 mg/dL (ref 0.0–149.0)
VLDL: 11.2 mg/dL (ref 0.0–40.0)

## 2015-11-03 MED ORDER — TRAMADOL HCL 50 MG PO TABS: 50.0000 mg | ORAL_TABLET | Freq: Three times a day (TID) | ORAL | Status: DC | PRN

## 2015-11-03 NOTE — Assessment & Plan Note (Signed)
Tolerating the claudication Helped by the tramadol Considering further intervention by specialist at [place]

## 2015-11-03 NOTE — Progress Notes (Signed)
Subjective:    Patient ID: Aaron Sexton, male    DOB: 1945/08/20, 70 y.o.   MRN: [id number]  HPI Here for Medicare wellness and follow up of chronic health conditions Reviewed form and advanced directives Reviewed other doctors No tobacco now. No alcohol Hasn't been exercising--but tries to walk when he can Vision and hearing are fine No falls No depression or anhedonia Independent with instrumental ADLs Hasn't noted any sig cognitive problems  Has had ongoing claudication in both legs Several procedures for this Dr Allyson Sabal unable to get access for percutaneous Rx Considering going to [facility] for specialist--but not sure if he wants to He uses the tramadol twice a day---"helps me live with the pain" Tylenol no help  Has carotid disease No symptoms Recent evaluation 50-69% bilaterally  No chest pain No SOB No palpitations No dizziness or syncope  Tolerates statin No muscle aching or GI symptoms  Takes cimetidine prn only Needs rarely actually  Current Outpatient Prescriptions on File Prior to Visit  Medication Sig Dispense Refill  . aspirin 81 MG tablet Take 81 mg by mouth daily.      . chlorthalidone (HYGROTON) 25 MG tablet Take 0.5 tablets (12.5 mg total) by mouth daily. 135 tablet 3  . cimetidine (TAGAMET) 400 MG tablet Take 1 tablet (400 mg total) by mouth 2 (two) times daily as needed. (Patient taking differently: Take 400 mg by mouth 2 (two) times daily as needed (indigestion). ) 60 tablet 3  . clopidogrel (PLAVIX) 75 MG tablet Take 1 tablet (75 mg total) by mouth daily with breakfast. 30 tablet 11  . losartan (COZAAR) 25 MG tablet TAKE ONE TABLET BY MOUTH EVERY DAY 90 tablet 0  . metoprolol succinate (TOPROL-XL) 100 MG 24 hr tablet TAKE ONE TABLET BY MOUTH EVERY DAY 90 tablet 3  . nitroGLYCERIN (NITROSTAT) 0.4 MG SL tablet Place 1 tablet (0.4 mg total) under the tongue every 5 (five) minutes as needed for chest pain. 90 tablet 3  . Omega-3 Fatty Acids (FISH OIL  PO) Take 1,000 mg by mouth daily.     . pravastatin (PRAVACHOL) 20 MG tablet Take 1 tablet (20 mg total) by mouth daily. 90 tablet 3  . traMADol (ULTRAM) 50 MG tablet TAKE 1 TABLET BY MOUTH EVERY 8 HOURS AS NEEDED FOR PAIN 20 tablet 0  . vitamin B-12 (CYANOCOBALAMIN) 1000 MCG tablet Take 1,000 mcg by mouth daily.       No current facility-administered medications on file prior to visit.    Allergies  Allergen Reactions  . Diltiazem Hcl Hives and Rash    Past Medical History  Diagnosis Date  . Diverticulosis 2012    found on colonoscopy  . Colon polyps 3/12    single 2mm polyp--tubular adenoma  . GERD (gastroesophageal reflux disease)   . Arteriosclerotic cardiovascular disease (ASCVD) 1993    Critical RCA disease in 1993 treated with PTCA  . Hyperlipidemia   . Erectile dysfunction   . Osteoarthritis     knees/hands-Dr Sherlean Foot  . Cerebrovascular disease     Moderate ASVD without focal stenosis in 01/2007  . Tobacco abuse, in remission     30-40 pack years discontinued in 1993  . Overweight(278.02)   . Rosacea   . Hematochezia 05/24/2010  . Peripheral vascular disease (HCC) 03/11/2010    Moderate SFA stenosis; history of claudication  . Vitamin B12 deficiency   . Family history of adverse reaction to anesthesia     difficult for son & sistor  to wake     Past Surgical History  Procedure Laterality Date  . Appendectomy    . Colonoscopy w/ polypectomy  2012  . Rotator cuff repair Left 7/15    Dr Sherlean Foot  . Peripheral vascular catheterization Bilateral 06/21/2015    Procedure: Lower Extremity Angiography;  Surgeon: Runell Gess, MD;  Location[facility] INVASIVE CV LAB;  Service: Cardiovascular;  Laterality: Bilateral;  . Peripheral vascular catheterization N/A 06/21/2015    Procedure: Abdominal Aortogram;  Surgeon: Runell Gess, MD;  Location: [facility] INVASIVE CV LAB;  Service: Cardiovascular;  Laterality: N/A;  . Peripheral vascular catheterization N/A 07/26/2015    Procedure: Lower  Extremity Angiography;  Surgeon: Runell Gess, MD;  Location: [facility] INVASIVE CV LAB;  Service: Cardiovascular;  Laterality: N/A;  . Iliac vein angioplasty / stenting Right 08/09/2015  . Peripheral vascular catheterization N/A 08/09/2015    Procedure: Lower Extremity Angiography;  Surgeon: Runell Gess, MD;  Location: [facility] INVASIVE CV LAB;  Service: Cardiovascular;  Laterality: N/A;  . Peripheral vascular catheterization  08/09/2015    Procedure: Peripheral Vascular Intervention;  Surgeon: Runell Gess, MD;  Location: [facility] INVASIVE CV LAB;  Service: Cardiovascular;;  rt ext. iliac atherectomy and stent  . Peripheral vascular catheterization N/A 09/20/2015    Procedure: Lower Extremity Angiography;  Surgeon: Runell Gess, MD;  Location: [facility] INVASIVE CV LAB;  Service: Cardiovascular;  Laterality: N/A;  . Peripheral vascular catheterization Right 09/20/2015    Procedure: Peripheral Vascular Intervention;  Surgeon: Runell Gess, MD;  Location: [facility] INVASIVE CV LAB;  Service: Cardiovascular;  Laterality: Right;  SFA    Family History  Problem Relation Age of Onset  . Hypertension Father     And siblings  . Heart disease Father     And second-degree relatives  . Transient ischemic attack Father   . Lung cancer Mother   . Cancer Sister     brain cancer    Social History   Social History  . Marital Status: Married    Spouse Name: N/A  . Number of Children: N/A  . Years of Education: N/A   Occupational History  . 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 Smoking status: Former Smoker -- 1.00 packs/day for 30 years    Quit date: 05/16/1990  . Smokeless tobacco: Never Used     Comment: Quit in 1993  . Alcohol Use: No  . Drug Use: No  . Sexual Activity: Not on file   Other Topics Concern  . Not on file   Social History Narrative   Has living will   Wife is health care POA   Would accept resuscitation but no  prolonged artificial ventilation.   No tube feeds if cognitively unaware   Review of Systems Has lost about 10# being careful Sleeps well Teeth okay--- keeps up with dentist [facility] dentist) Wears seat belt Bowels are fine--no blood in stool Voids okay. No sex--no problem. No back or joint pains No skin problems    Objective:   Physical Exam  Constitutional: He is oriented to person, place, and time. He appears well-developed and well-nourished. No distress.  HENT:  Mouth/Throat: Oropharynx is clear and moist. No oropharyngeal exudate.  Neck: Normal range of motion. Neck supple. No thyromegaly present.  Cardiovascular: Normal rate, regular rhythm and normal heart sounds.  Exam reveals no gallop.   No murmur heard. Feet warm but no pulses palpable  Pulmonary/Chest: Effort normal and breath sounds normal. No respiratory distress. He has no wheezes. He has no rales.  Abdominal: Soft. There is no tenderness.  Musculoskeletal: He exhibits no edema or tenderness.  Lymphadenopathy:    He has no cervical adenopathy.  Neurological: He is alert and oriented to person, place, and time.  President-- "Arletha Pili, [place]" [phone] [id number] Recall 3/3  Skin: No rash noted. No erythema.  Psychiatric: He has a normal mood and affect. His behavior is normal.          Assessment & Plan:

## 2015-11-03 NOTE — Assessment & Plan Note (Signed)
I have personally reviewed the Medicare Annual Wellness questionnaire and have noted 1. The patient's medical and social history 2. Their use of alcohol, tobacco or illicit drugs 3. Their current medications and supplements 4. The patient's functional ability including ADL's, fall risks, home safety risks and hearing or visual             impairment. 5. Diet and physical activities 6. Evidence for depression or mood disorders  The patients weight, height, BMI and visual acuity have been recorded in the chart I have made referrals, counseling and provided education to the patient based review of the above and I have provided the pt with a written personalized care plan for preventive services.  I have provided you with a copy of your personalized plan for preventive services. Please take the time to review along with your updated medication list.  Yearly flu vaccine Has been working on exercise Due for colonoscopy-- Dr Gustavo Lah No PSA due to age

## 2015-11-03 NOTE — Assessment & Plan Note (Signed)
See social history 

## 2015-11-03 NOTE — Assessment & Plan Note (Signed)
Due for colon now

## 2015-11-03 NOTE — Assessment & Plan Note (Signed)
No angina On appropriate secondary prevention

## 2015-11-03 NOTE — Progress Notes (Signed)
Pre visit review using our clinic review tool, if applicable. No additional management support is needed unless otherwise documented below in the visit note. 

## 2015-11-03 NOTE — Assessment & Plan Note (Signed)
Not enough for intervention On statin, plavix, BP control

## 2015-11-05 ENCOUNTER — Other Ambulatory Visit: Payer: Self-pay | Admitting: Internal Medicine

## 2015-11-29 ENCOUNTER — Other Ambulatory Visit: Payer: Self-pay | Admitting: Internal Medicine

## 2015-11-29 NOTE — Telephone Encounter (Signed)
Received refill request electronically Last refill 11/03/15 #60 Last office visit 11/03/15

## 2015-11-30 NOTE — Telephone Encounter (Signed)
Medication phoned to pharmacy.  

## 2015-11-30 NOTE — Telephone Encounter (Signed)
Please call in.  Thanks.   

## 2015-12-30 ENCOUNTER — Other Ambulatory Visit: Payer: Self-pay | Admitting: Family Medicine

## 2015-12-30 NOTE — Telephone Encounter (Signed)
Left refill on voice mail at pharmacy  

## 2015-12-30 NOTE — Telephone Encounter (Signed)
Approved: #[phone] x 0 

## 2015-12-30 NOTE — Telephone Encounter (Signed)
Last filled 11-30-15 #60 Last OV 11-03-15 Next OV 11-10-16

## 2016-01-27 ENCOUNTER — Other Ambulatory Visit: Payer: Self-pay | Admitting: Internal Medicine

## 2016-01-27 NOTE — Telephone Encounter (Signed)
Last filled 12-30-15 #60 Last OV 11-03-15 Next OV 11-10-16

## 2016-01-27 NOTE — Telephone Encounter (Signed)
Left refill on voice mail at pharmacy  

## 2016-01-27 NOTE — Telephone Encounter (Signed)
Approved: #[phone] x 0 

## 2016-02-29 ENCOUNTER — Other Ambulatory Visit: Payer: Self-pay | Admitting: Internal Medicine

## 2016-02-29 NOTE — Telephone Encounter (Signed)
Left refill on voice mail at pharmacy  

## 2016-02-29 NOTE — Telephone Encounter (Signed)
Approved: #[phone] x 0 

## 2016-02-29 NOTE — Telephone Encounter (Signed)
Last filled 01-27-16 #60 Last OV 11-03-15 Next OV 11-10-16

## 2016-03-08 ENCOUNTER — Ambulatory Visit (INDEPENDENT_AMBULATORY_CARE_PROVIDER_SITE_OTHER): Payer: Medicare Other | Admitting: Family Medicine

## 2016-03-08 ENCOUNTER — Encounter: Payer: Self-pay | Admitting: Family Medicine

## 2016-03-08 VITALS — BP 118/62 | HR 72 | Temp 97.8°F | Wt 220.0 lb

## 2016-03-08 DIAGNOSIS — L03032 Cellulitis of left toe: Secondary | ICD-10-CM | POA: Diagnosis not present

## 2016-03-08 MED ORDER — AMOXICILLIN-POT CLAVULANATE 875-125 MG PO TABS: 1.0000 | ORAL_TABLET | Freq: Two times a day (BID) | ORAL | 0 refills | Status: DC

## 2016-03-08 NOTE — Patient Instructions (Signed)
Please let us know if you are getting worse or are not better in 48 hours  Paronychia Paronychia is an infection of the skin that surrounds a nail. It usually affects the skin around a fingernail, but it may also occur near a toenail. It often causes pain and swelling around the nail. This condition may come on suddenly or develop over a longer period. In some cases, a collection of pus (abscess) can form near or under the nail. Usually, paronychia is not serious and it clears up with treatment. CAUSES This condition may be caused by bacteria or fungi. It is commonly caused by either Streptococcus or Staphylococcus bacteria. The bacteria or fungi often cause the infection by getting into the affected area through an opening in the skin, such as a cut or a hangnail. RISK FACTORS This condition is more likely to develop in:  People who get their hands wet often, such as those who work as Designer, industrial/product, bartenders, or nurses.  People who bite their fingernails or suck their thumbs.  People who trim their nails too short.  People who have hangnails or injured fingertips.  People who get manicures.  People who have diabetes. SYMPTOMS Symptoms of this condition include:  Redness and swelling of the skin near the nail.  Tenderness around the nail when you touch the area.  Pus-filled bumps under the cuticle. The cuticle is the skin at the base or sides of the nail.  Fluid or pus under the nail.  Throbbing pain in the area. DIAGNOSIS This condition is usually diagnosed with a physical exam. In some cases, a sample of pus may be taken from an abscess to be tested in a lab. This can help to determine what type of bacteria or fungi is causing the condition. TREATMENT Treatment for this condition depends on the cause and severity of the condition. If the condition is mild, it may clear up on its own in a few days. Your health care provider may recommend soaking the affected area in warm water a  few times a day. When treatment is needed, the options may include:  Antibiotic medicine, if the condition is caused by a bacterial infection.  Antifungal medicine, if the condition is caused by a fungal infection.  Incision and drainage, if an abscess is present. In this procedure, the health care provider will cut open the abscess so the pus can drain out. HOME CARE INSTRUCTIONS  Soak the affected area in warm water if directed to do so by your health care provider. You may be told to do this for 20 minutes, 2-3 times a day. Keep the area dry in between soakings.  Take medicines only as directed by your health care provider.  If you were prescribed an antibiotic medicine, finish all of it even if you start to feel better.  Keep the affected area clean.  Do not try to drain a fluid-filled bump yourself.  If you will be washing dishes or performing other tasks that require your hands to get wet, wear rubber gloves. You should also wear gloves if your hands might come in contact with irritating substances, such as cleaners or chemicals.  Follow your health care provider's instructions about:  Wound care.  Bandage (dressing) changes and removal. SEEK MEDICAL CARE IF:  Your symptoms get worse or do not improve with treatment.  You have a fever or chills.  You have redness spreading from the affected area.  You have continued or increased fluid, blood, or pus  coming from the affected area.  Your finger or knuckle becomes swollen or is difficult to move.   This information is not intended to replace advice given to you by your health care provider. Make sure you discuss any questions you have with your health care provider.   Document Released: 10/18/2000 Document Revised: 09/08/2014 Document Reviewed: 04/01/2014 Elsevier Interactive Patient Education [employer].

## 2016-03-08 NOTE — Progress Notes (Signed)
Subjective:    Patient ID: Aaron Sexton, male    DOB: 02/24/1946, 70 y.o.   MRN: [id number]  HPI This is a very pleasant 70 yo male accompanied by his wife. He has had 4 days of left great toe pain. He awoke 4 days ago with slight pain and it has gotten progressively worse.He was on his feet a lot yesterday and pain was worse than today. No fever/chills or streaking. No history of gout or similar pain in feet. He has known PVD. Occasional burning of feet, no decreased sensation. Has been doing warm water and Epson salt soaks and applied Mercurocrome.    Past Medical History:  Diagnosis Date  . Arteriosclerotic cardiovascular disease (ASCVD) 1993   Critical RCA disease in 1993 treated with PTCA  . Cerebrovascular disease    Moderate ASVD without focal stenosis in 01/2007  . Colon polyps 3/12   single 2mm polyp--tubular adenoma  . Diverticulosis 2012   found on colonoscopy  . Erectile dysfunction   . Family history of adverse reaction to anesthesia    difficult for son & sistor to wake   . GERD (gastroesophageal reflux disease)   . Hematochezia 05/24/2010  . Hyperlipidemia   . Osteoarthritis    knees/hands-Dr Sherlean Foot  . Overweight(278.02)   . Peripheral vascular disease (HCC) 03/11/2010   Moderate SFA stenosis; history of claudication  . Rosacea   . Tobacco abuse, in remission    30-40 pack years discontinued in 1993  . Vitamin B12 deficiency    Past Surgical History:  Procedure Laterality Date  . APPENDECTOMY    . COLONOSCOPY W/ POLYPECTOMY  2012  . ILIAC VEIN ANGIOPLASTY / STENTING Right 08/09/2015  . PERIPHERAL VASCULAR CATHETERIZATION Bilateral 06/21/2015   Procedure: Lower Extremity Angiography;  Surgeon: Runell Gess, MD;  Location: [facility] INVASIVE CV LAB;  Service: Cardiovascular;  Laterality: Bilateral;  . PERIPHERAL VASCULAR CATHETERIZATION N/A 06/21/2015   Procedure: Abdominal Aortogram;  Surgeon: Runell Gess, MD;  Location: [facility] INVASIVE CV LAB;  Service:  Cardiovascular;  Laterality: N/A;  . PERIPHERAL VASCULAR CATHETERIZATION N/A 07/26/2015   Procedure: Lower Extremity Angiography;  Surgeon: Runell Gess, MD;  Location: [facility] INVASIVE CV LAB;  Service: Cardiovascular;  Laterality: N/A;  . PERIPHERAL VASCULAR CATHETERIZATION N/A 08/09/2015   Procedure: Lower Extremity Angiography;  Surgeon: Runell Gess, MD;  Location: [facility] INVASIVE CV LAB;  Service: Cardiovascular;  Laterality: N/A;  . PERIPHERAL VASCULAR CATHETERIZATION  08/09/2015   Procedure: Peripheral Vascular Intervention;  Surgeon: Runell Gess, MD;  Location: [facility] INVASIVE CV LAB;  Service: Cardiovascular;;  rt ext. iliac atherectomy and stent  . PERIPHERAL VASCULAR CATHETERIZATION N/A 09/20/2015   Procedure: Lower Extremity Angiography;  Surgeon: Runell Gess, MD;  Location: [facility] INVASIVE CV LAB;  Service: Cardiovascular;  Laterality: N/A;  . PERIPHERAL VASCULAR CATHETERIZATION Right 09/20/2015   Procedure: Peripheral Vascular Intervention;  Surgeon: Runell Gess, MD;  Location: [facility] INVASIVE CV LAB;  Service: Cardiovascular;  Laterality: Right;  SFA  . ROTATOR CUFF REPAIR Left 7/15   Dr Sherlean Foot   Family History  Problem Relation Age of Onset  . Hypertension Father     And siblings  . Heart disease Father     And second-degree relatives  . Transient ischemic attack Father   . Lung cancer Mother   . Cancer Sister     brain cancer      Review of Systems Per HPI    Objective:   Physical Exam  Constitutional:  He is oriented to person, place, and time. He appears well-developed and well-nourished. No distress.  Obese.   HENT:  Head: Normocephalic and atraumatic.  Eyes: Conjunctivae are normal.  Cardiovascular: Normal rate.   Pulmonary/Chest: Effort normal.  Musculoskeletal: He exhibits no edema.  Neurological: He is alert and oriented to person, place, and time.  Skin: Skin is warm and dry. He is not diaphoretic.  Left great toe with erythema around nail bed to DIP,  difficult to assess amount due to mercurochrome use. Lateral edge of nail with visible pus under skin. Non fluctuant. Tender.   Psychiatric: He has a normal mood and affect. His behavior is normal. Judgment and thought content normal.  Vitals reviewed.     BP 118/62   Pulse 72   Temp 97.8 F (36.6 C)   Wt 220 lb (99.8 kg)   SpO2 93%   BMI 33.70 kg/m  Wt Readings from Last 3 Encounters:  03/08/16 220 lb (99.8 kg)  11/03/15 220 lb (99.8 kg)  10/15/15 220 lb 12.8 oz (100.2 kg)       Assessment & Plan:  1. Paronychia of great toe, left - not a candidate for drainage since area non fluctuant and at increased risk due to Plavix - Provided written and verbal information regarding diagnosis and treatment. - RTC precautions reviewed - continue warm soaks several times a day  - amoxicillin-clavulanate (AUGMENTIN) 875-125 MG tablet; Take 1 tablet by mouth 2 (two) times daily.  Dispense: 14 tablet; Refill: 0  Olean Ree, FNP-BC  [facility], [facility]  03/08/2016 12:04 PM

## 2016-04-05 ENCOUNTER — Other Ambulatory Visit: Payer: Self-pay

## 2016-04-05 NOTE — Telephone Encounter (Signed)
Pt left v/m requesting status of refill. Pt did not leave name of pharmacy or name of med. Left v/m requesting pt to cb.

## 2016-04-06 MED ORDER — TRAMADOL HCL 50 MG PO TABS: 50.0000 mg | ORAL_TABLET | Freq: Three times a day (TID) | ORAL | 0 refills | Status: DC | PRN

## 2016-04-06 NOTE — Telephone Encounter (Signed)
Left refill on voice mail at pharmacy  

## 2016-04-06 NOTE — Telephone Encounter (Signed)
Pt left v/m requesting refill tramadol to [facility] [address]. Last refilled # 60 on 02/29/16. Last annual 11/03/15.

## 2016-04-06 NOTE — Telephone Encounter (Signed)
Approved: #[phone] x 0 

## 2016-04-10 ENCOUNTER — Encounter: Payer: Self-pay | Admitting: Cardiology

## 2016-04-10 ENCOUNTER — Ambulatory Visit (INDEPENDENT_AMBULATORY_CARE_PROVIDER_SITE_OTHER): Payer: Medicare Other | Admitting: Cardiology

## 2016-04-10 VITALS — BP 139/72 | HR 66 | Ht 67.0 in | Wt 215.2 lb

## 2016-04-10 DIAGNOSIS — I2510 Atherosclerotic heart disease of native coronary artery without angina pectoris: Secondary | ICD-10-CM

## 2016-04-10 DIAGNOSIS — I6523 Occlusion and stenosis of bilateral carotid arteries: Secondary | ICD-10-CM | POA: Diagnosis not present

## 2016-04-10 DIAGNOSIS — I739 Peripheral vascular disease, unspecified: Secondary | ICD-10-CM

## 2016-04-10 NOTE — Patient Instructions (Signed)
Your physician wants you to follow-up in: 1 Year with Dr. Harl Bowie.  You will receive a reminder letter in the mail two months in advance. If you don't receive a letter, please call our office to schedule the follow-up appointment.  Your physician recommends that you continue on your current medications as directed. Please refer to the Current Medication list given to you today.  Your physician has requested that you have a carotid duplex. This test is an ultrasound of the carotid arteries in your neck. It looks at blood flow through these arteries that supply the brain with blood. Allow one hour for this exam. There are no restrictions or special instructions.  If you need a refill on your cardiac medications before your next appointment, please call your pharmacy.  Thank you for choosing [facility]!

## 2016-04-10 NOTE — Progress Notes (Signed)
Clinical Summary Mr. Merrick is a 70 y.o.male seen today for follow up of the following medical problems.   1. CAD  - prior PTCA to RCA in 1993   - no recent chest pain. No SOB or DOE - compliant with meds  2. Carotid stenosis  - moderate disease on most recent imaging 03/2015 - denies any neurological symptoms since last visit   3. Hyperlipidemia  - compliant with statin. - last panel 10/2015 TC 80 TG 56 HDL 48 LDL 21 - compliant with statin.   4. Claudication bilateral cramping pain in calves with walking at < 1 block. No pain at rest. No sores on feet. Not improved with pletal after increasing to 100mg  bid, he asked to stop this medication and we did - ABIs L 0.59 and R 0.68. Waveforms suggestive of inflow disease. .  - followed by Dr Gwenlyn Found.  - 09/20/15 failed attempt at percutanous revasc of right SFA CTO, recs for continued medical therapy.  - 08/09/15 stenting of right EIA - from notes prior failed attempt at left SFA stenting as well  - no recent leg pains.    5. HTN  - checks bp once weekly. Typicalyl 130s/ 80s - compliant with meds  Past Medical History:  Diagnosis Date  . Arteriosclerotic cardiovascular disease (ASCVD) 1993   Critical RCA disease in 1993 treated with PTCA  . Cerebrovascular disease    Moderate ASVD without focal stenosis in 01/2007  . Colon polyps 3/12   single 39mm polyp--tubular adenoma  . Diverticulosis 2012   found on colonoscopy  . Erectile dysfunction   . Family history of adverse reaction to anesthesia    difficult for son & sistor to wake   . GERD (gastroesophageal reflux disease)   . Hematochezia 05/24/2010  . Hyperlipidemia   . Osteoarthritis    knees/hands-Dr Ronnie Derby  . Overweight(278.02)   . Peripheral vascular disease ([place]) 03/11/2010   Moderate SFA stenosis; history of claudication  . Rosacea   . Tobacco abuse, in remission    30-40 pack years discontinued in 1993  . Vitamin B12 deficiency      Allergies    Allergen Reactions  . Diltiazem Hcl Hives and Rash     Current Outpatient Prescriptions  Medication Sig Dispense Refill  . amoxicillin-clavulanate (AUGMENTIN) 875-125 MG tablet Take 1 tablet by mouth 2 (two) times daily. 14 tablet 0  . aspirin 81 MG tablet Take 81 mg by mouth daily.      . chlorthalidone (HYGROTON) 25 MG tablet Take 0.5 tablets (12.5 mg total) by mouth daily. 135 tablet 3  . cimetidine (TAGAMET) 400 MG tablet Take 1 tablet (400 mg total) by mouth 2 (two) times daily as needed. (Patient taking differently: Take 400 mg by mouth 2 (two) times daily as needed (indigestion). ) 60 tablet 3  . clopidogrel (PLAVIX) 75 MG tablet Take 1 tablet (75 mg total) by mouth daily with breakfast. 30 tablet 11  . losartan (COZAAR) 25 MG tablet TAKE ONE TABLET BY MOUTH EVERY DAY 90 tablet 3  . metoprolol succinate (TOPROL-XL) 100 MG 24 hr tablet TAKE ONE TABLET BY MOUTH EVERY DAY 90 tablet 3  . nitroGLYCERIN (NITROSTAT) 0.4 MG SL tablet Place 1 tablet (0.4 mg total) under the tongue every 5 (five) minutes as needed for chest pain. 90 tablet 3  . Omega-3 Fatty Acids (FISH OIL PO) Take 1,000 mg by mouth daily.     . pravastatin (PRAVACHOL) 20 MG tablet Take 1  tablet (20 mg total) by mouth daily. 90 tablet 3  . traMADol (ULTRAM) 50 MG tablet Take 1 tablet (50 mg total) by mouth every 8 (eight) hours as needed. for pain 60 tablet 0  . vitamin B-12 (CYANOCOBALAMIN) 1000 MCG tablet Take 1,000 mcg by mouth daily.       No current facility-administered medications for this visit.      Past Surgical History:  Procedure Laterality Date  . APPENDECTOMY    . COLONOSCOPY W/ POLYPECTOMY  2012  . ILIAC VEIN ANGIOPLASTY / STENTING Right 08/09/2015  . PERIPHERAL VASCULAR CATHETERIZATION Bilateral 06/21/2015   Procedure: Lower Extremity Angiography;  Surgeon: Lorretta Harp, MD;  Location: [facility] CV LAB;  Service: Cardiovascular;  Laterality: Bilateral;  . PERIPHERAL VASCULAR CATHETERIZATION N/A  06/21/2015   Procedure: Abdominal Aortogram;  Surgeon: Lorretta Harp, MD;  Location: [place] CV LAB;  Service: Cardiovascular;  Laterality: N/A;  . PERIPHERAL VASCULAR CATHETERIZATION N/A 07/26/2015   Procedure: Lower Extremity Angiography;  Surgeon: Lorretta Harp, MD;  Location: [facility] CV LAB;  Service: Cardiovascular;  Laterality: N/A;  . PERIPHERAL VASCULAR CATHETERIZATION N/A 08/09/2015   Procedure: Lower Extremity Angiography;  Surgeon: Lorretta Harp, MD;  Location: [place] CV LAB;  Service: Cardiovascular;  Laterality: N/A;  . PERIPHERAL VASCULAR CATHETERIZATION  08/09/2015   Procedure: Peripheral Vascular Intervention;  Surgeon: Lorretta Harp, MD;  Location: [place] CV LAB;  Service: Cardiovascular;;  rt ext. iliac atherectomy and stent  . PERIPHERAL VASCULAR CATHETERIZATION N/A 09/20/2015   Procedure: Lower Extremity Angiography;  Surgeon: Lorretta Harp, MD;  Location: [place] CV LAB;  Service: Cardiovascular;  Laterality: N/A;  . PERIPHERAL VASCULAR CATHETERIZATION Right 09/20/2015   Procedure: Peripheral Vascular Intervention;  Surgeon: Lorretta Harp, MD;  Location: [place] CV LAB;  Service: Cardiovascular;  Laterality: Right;  SFA  . ROTATOR CUFF REPAIR Left 7/15   Dr Ronnie Derby     Allergies  Allergen Reactions  . Diltiazem Hcl Hives and Rash      Family History  Problem Relation Age of Onset  . Hypertension Father     And siblings  . Heart disease Father     And second-degree relatives  . Transient ischemic attack Father   . Lung cancer Mother   . Cancer Sister     brain cancer     Social History Mr. Busse reports that he quit smoking about 25 years ago. He has a 30.00 pack-year smoking history. He has never used smokeless tobacco. Mr. Vongphakdy reports that he does not drink alcohol.   Review of Systems CONSTITUTIONAL: No weight loss, fever, chills, weakness or fatigue.  HEENT: Eyes: No visual loss, blurred vision, double vision or  yellow sclerae.No hearing loss, sneezing, congestion, runny nose or sore throat.  SKIN: No rash or itching.  CARDIOVASCULAR: per hpi RESPIRATORY: No shortness of breath, cough or sputum.  GASTROINTESTINAL: No anorexia, nausea, vomiting or diarrhea. No abdominal pain or blood.  GENITOURINARY: No burning on urination, no polyuria NEUROLOGICAL: No headache, dizziness, syncope, paralysis, ataxia, numbness or tingling in the extremities. No change in bowel or bladder control.  MUSCULOSKELETAL: No muscle, back pain, joint pain or stiffness.  LYMPHATICS: No enlarged nodes. No history of splenectomy.  PSYCHIATRIC: No history of depression or anxiety.  ENDOCRINOLOGIC: No reports of sweating, cold or heat intolerance. No polyuria or polydipsia.  [name]   Physical Examination Vitals:   04/10/16 1630  BP: 139/72  Pulse: 66   Vitals:  04/10/16 1630  Weight: 215 lb 3.2 oz (97.6 kg)  Height: 5\' 7"  (1.702 m)    Gen: resting comfortably, no acute distress HEENT: no scleral icterus, pupils equal round and reactive, no palptable cervical adenopathy,  CV: RRR, no m/r/g, no jvd Resp: Clear to auscultation bilaterally GI: abdomen is soft, non-tender, non-distended, normal bowel sounds, no hepatosplenomegaly MSK: extremities are warm, no edema.  Skin: warm, no rash Neuro:  no focal deficits Psych: appropriate affect   Diagnostic Studies 08/2011 Carotid US:  IMPRESSION: No significant change in the bilateral carotid atherosclerosis.  Right ICA narrowing less than 50%.  Moderate left ICA narrowing, 50-69%.  Pertinent labs  08/2012: TC 84 TG 71 HDL 40 LDL 29 Na 135 BUN 25 Cr 1.1 Hgb 12.9 Hct 39.3 Plt 280 AST 23 ALT 31 TSH 0.51   03/14/13 Clinic EKG: sinus rhythm, normal axis, no ischemic changes   03/2009 ABI  IMPRESSION: Similar pattern of arterial occlusive disease in both lower extremities. Ankle-brachial indices are moderately depressed at rest with segmental evaluation consistent with  bilateral SFA occlusive disease and potential component of proximal inflow disease. Further anatomic delineation would be helpful with a study such as CT angiography to determine exact nature of occlusive disease.   08/2013 ABI FINDINGS:  Right ABI: 0.68  Left ABI: 0.59  Right Lower Extremity: Significantly decreased pressure between the  right brachial pressure and right upper thigh pressure suggests  significant inflow disease. No focal pressure drop between the upper  and lower thigh, or below the knee. Moderately abnormal PVRs from  the thigh to the metatarsals consistent with inflow disease.  Left Lower Extremity: Significantly decreased pressure between the  left brachial pressure and left upper thigh consistent with inflow  disease. No significant pressure drop between the upper thigh and  lower thigh or below the knee. Moderately abnormal PVRs throughout  the left lower extremity consistent with inflow disease.  IMPRESSION:  1. Abnormal bilateral ankle-brachial indices consistent with  moderate peripheral arterial disease which is slightly improved  compared to 03/15/2009. Query interval history of SFA intervention,  or successful exercise program?  2. Based on today's evaluation, the level of disease appears to be  inflow (aortoiliac) bilaterally.  Signed,  Criselda Peaches, MD  [facility]  [facility]   08/2013 Carotid US IMPRESSION:  1. Interval progression of mild right ICA stenosis from less than  50% to an estimated 50- 69%.  2. No significant interval change in moderate (50-69%) left ICA  narrowing.  3. Vertebral arteries are patent with normal antegrade flow.  Signed,   03/2015 Carotid US Right:  Heterogeneous plaque at the carotid bifurcation, with discordant results regarding degree of stenosis. Peak velocity suggests 50%- 69% stenosis, with the ICA/ CCA ratio suggesting  a lesser degree of stenosis. If establishing a more accurate degree of stenosis is desired, cerebral angiogram should be considered, or as a second best test, CTA.  Left:  Heterogeneous plaque at the carotid bifurcation, with discordant results regarding degree of stenosis. Peak velocity suggests 50% -69% stenosis, with the ICA/ CCA ratio suggesting a lesser degree of stenosis. If establishing a more accurate degree of stenosis is desired, cerebral angiogram should be considered, or as a second best test, CTA.  Assessment and Plan   1. CAD  - no current symptoms  - continue current meds  2. Carotid stenosis  - no current symptoms  - we will repeat carotid US  3. Hyperlipidemia  -  lipids at goal, continue current meds  4. Claudication  - no recent symptoms - continue to follow with Dr Gwenlyn Found   5. HTN  - he is at goal, conitnue current meds      Arnoldo Lenis, M.D.

## 2016-05-09 ENCOUNTER — Other Ambulatory Visit: Payer: Self-pay | Admitting: Internal Medicine

## 2016-05-09 NOTE — Telephone Encounter (Signed)
This is a medication only Dr Silvio Pate can approve. We have not received a request from them as of right now. I will send the request to him to approve for tomorrow  Last called in 04-06-16 #60 Last OV 03-08-16 Next OV 11-10-16

## 2016-05-09 NOTE — Telephone Encounter (Signed)
Pt is calling about his refill of tramadol.  He said the pharmacy called them because they havent heard from [place] about this request.  Can you please call the pharmacy.

## 2016-05-10 MED ORDER — TRAMADOL HCL 50 MG PO TABS: 50.0000 mg | ORAL_TABLET | Freq: Three times a day (TID) | ORAL | 0 refills | Status: DC | PRN

## 2016-05-10 NOTE — Telephone Encounter (Signed)
Approved: #[phone] x 0 

## 2016-05-10 NOTE — Telephone Encounter (Signed)
Left refill on voice mail at pharmacy  

## 2016-05-23 ENCOUNTER — Ambulatory Visit (HOSPITAL_COMMUNITY)
Admission: RE | Admit: 2016-05-23 | Discharge: 2016-05-23 | Disposition: A | Discharge status: Home / Self Care | Payer: Medicare Other | Source: Ambulatory Visit | Attending: Cardiology | Admitting: Cardiology

## 2016-05-23 ENCOUNTER — Other Ambulatory Visit: Payer: Self-pay | Admitting: Cardiology

## 2016-05-23 DIAGNOSIS — I6523 Occlusion and stenosis of bilateral carotid arteries: Secondary | ICD-10-CM | POA: Insufficient documentation

## 2016-05-26 ENCOUNTER — Telehealth: Payer: Self-pay | Admitting: Cardiology

## 2016-05-26 NOTE — Telephone Encounter (Signed)
Wanting results from Carotid US

## 2016-05-29 NOTE — Telephone Encounter (Signed)
Will forward to Dr. Branch. 

## 2016-06-09 ENCOUNTER — Other Ambulatory Visit: Payer: Self-pay | Admitting: Internal Medicine

## 2016-06-09 ENCOUNTER — Other Ambulatory Visit: Payer: Self-pay

## 2016-06-09 MED ORDER — TRAMADOL HCL 50 MG PO TABS: 50.0000 mg | ORAL_TABLET | Freq: Three times a day (TID) | ORAL | 0 refills | Status: DC | PRN

## 2016-06-09 NOTE — Telephone Encounter (Signed)
Ok to phone in Tramadol 

## 2016-06-09 NOTE — Telephone Encounter (Signed)
Left refill on voice mail at pharmacy  

## 2016-06-09 NOTE — Telephone Encounter (Signed)
Pt request refill tramadol to [facility] [address] Pt thought pharmacy requested refill on 06/06/16; pt will be out of med today. Last seen 11/03/15 for annual exam. Last refilled # 60 on 05/10/16. Dr Silvio Pate out of office this afternoon.Please advise.

## 2016-06-26 ENCOUNTER — Telehealth: Payer: Self-pay | Admitting: Cardiovascular Disease

## 2016-06-26 NOTE — Telephone Encounter (Signed)
New Message    Pt c/o medication issue:  1. Name of Medication: clopidogrel (PLAVIX) 75 MG tablet  2. How are you currently taking this medication (dosage and times per day)? 75 MG taking it 1 time per day  3. Are you having a reaction (difficulty breathing--STAT)? NO  4. What is your medication issue? Pt having teeth cleaned and wants to know if it is OK to stay on Plavis. Pt states that last year he was advised to come off for the teeth cleaning and would like call back

## 2016-06-26 NOTE — Telephone Encounter (Signed)
Returned call.  Advised no interruption to antiplatelet therapy for routine cleaning. Pt aware to call if further needs.

## 2016-07-04 ENCOUNTER — Other Ambulatory Visit: Payer: Self-pay | Admitting: Internal Medicine

## 2016-07-04 NOTE — Telephone Encounter (Signed)
Approved: #[phone] x 0 

## 2016-07-04 NOTE — Telephone Encounter (Signed)
Rx called in to pharmacy. 

## 2016-07-04 NOTE — Telephone Encounter (Signed)
Last filled 06/09/2016 #60--please advise

## 2016-07-11 ENCOUNTER — Encounter: Payer: Self-pay | Admitting: Cardiovascular Disease

## 2016-07-11 ENCOUNTER — Ambulatory Visit (INDEPENDENT_AMBULATORY_CARE_PROVIDER_SITE_OTHER): Payer: Medicare Other | Admitting: Cardiovascular Disease

## 2016-07-11 VITALS — BP 132/62 | HR 71 | Ht 69.5 in | Wt 217.0 lb

## 2016-07-11 DIAGNOSIS — I2510 Atherosclerotic heart disease of native coronary artery without angina pectoris: Secondary | ICD-10-CM | POA: Diagnosis not present

## 2016-07-11 DIAGNOSIS — I739 Peripheral vascular disease, unspecified: Secondary | ICD-10-CM

## 2016-07-11 NOTE — Patient Instructions (Signed)
Medication Instructions: Your physician recommends that you continue on your current medications as directed. Please refer to the Current Medication list given to you today.  Testing/Procedures: Your physician has requested that you have a lower extremity arterial duplex. During this test, ultrasound is used to evaluate arterial blood flow in the legs. Allow one hour for this exam. There are no restrictions or special instructions.  Your physician has requested that you have an ankle brachial index (ABI). During this test an ultrasound and blood pressure cuff are used to evaluate the arteries that supply the arms and legs with blood. Allow thirty minutes for this exam. There are no restrictions or special instructions.  (In May 2018)   Follow-Up: Your physician wants you to follow-up in: 1 year with Dr. Berry. You will receive a reminder letter in the mail two months in advance. If you don't receive a letter, please call our office to schedule the follow-up appointment.  If you need a refill on your cardiac medications before your next appointment, please call your pharmacy.  

## 2016-07-11 NOTE — Assessment & Plan Note (Signed)
History of peripheral arterial disease status post angiography by myself 06/21/15 revealing long segment calcified bilateral SFA CTO's with three-vessel runoff. I ultimately performed diamondback orbital rotation atherectomy, PTCA and stenting of a highly calcified, physiologic significant right external iliac artery stenosis which improved his claudication. I then came back and was unsuccessful at revascularizing his right SFA percutaneously because of the long diffuse calcified nature of the stenosis. He continues to have limiting claudication. He failed a trial of Pletal. At this point, he has 3 options. We can continue to treat him conservatively, I can refer him to [name] for another attempt at antegrade recanalization of his right SFA were femoropopliteal bypass grafting. We will continue to follow his lower extremity arterial Doppler studies. I will see him back in one year.

## 2016-07-11 NOTE — Progress Notes (Signed)
07/11/2016 Aaron Sexton   [birth year]  [id number]  Primary Physician Aaron Abide, MD Primary Cardiologist: Aaron Gess MD Aaron Sexton  HPI:  [name] is a 71 year old mildly overweight married Caucasian male father of 3, grandfather and 3 grandchildren referred by Aaron Sexton registered nurse practitioner for evaluation of peripheral arterial disease. His cardiologist is Dr. Dina Sexton. i last saw him in the office 10/15/15. He has a history of hypertension and hyperlipidemia. I performed angioplasty of his right coronary artery back in 1994 and he's been a symptomatically since. He stopped smoking at that time. 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 He complains of worsening left greater than right lower extremity claudication. Dopplers performed at [facility] 09/11/13 revealed a right ABI 0.68 and a left ABI 0.59. There was a high-frequency signal in the right external iliac artery. Angiography recently performed showed a high-grade calcific//exophytic plaque in the right external iliac artery with a 30 mm gradient. 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 I failed to cross the highly calcified left SFA CTO however.I attempted right SFA intervention 09/20/15 unsuccessfully He does have mild lifestyle limiting claudication but this is not severe. Since I saw him a year ago. Been no change. He did fail a trial of Pletal.   Current Outpatient Prescriptions  Medication Sig Dispense Refill  . aspirin 81 MG tablet Take 81 mg by mouth daily.      . chlorthalidone (HYGROTON) 25 MG tablet Take 0.5 tablets (12.5 mg total) by mouth daily. 135 tablet 3  . cimetidine (TAGAMET) 400 MG tablet Take 400 mg by mouth 2 (two) times daily as needed (indigestion).    . clopidogrel (PLAVIX) 75 MG tablet Take 1 tablet (75 mg  total) by mouth daily with breakfast. 30 tablet 11  . losartan (COZAAR) 25 MG tablet TAKE ONE TABLET BY MOUTH EVERY DAY 90 tablet 3  . metoprolol succinate (TOPROL-XL) 100 MG 24 hr tablet TAKE 1 TABLET BY MOUTH EVERY DAY 90 tablet 0  . nitroGLYCERIN (NITROSTAT) 0.4 MG SL tablet Place 1 tablet (0.4 mg total) under the tongue every 5 (five) minutes as needed for chest pain. 90 tablet 3  . Omega-3 Fatty Acids (FISH OIL PO) Take 1,000 mg by mouth daily.     . pravastatin (PRAVACHOL) 20 MG tablet TAKE 1 TABLET(20 MG) BY MOUTH DAILY 90 tablet 0  . traMADol (ULTRAM) 50 MG tablet TAKE 1 TABLET BY MOUTH EVERY 8 HOURS AS NEEDED FOR PAIN 60 tablet 0  . vitamin B-12 (CYANOCOBALAMIN) 1000 MCG tablet Take 1,000 mcg by mouth daily.       No current facility-administered medications for this visit.     Allergies  Allergen Reactions  . Diltiazem Hcl Hives and Rash    Social History   Social History  . Marital status: Married    Spouse name: N/A  . Number of children: N/A  . Years of education: N/A   Occupational History  . 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 Smoking status: Former Smoker    Packs/day: 1.00    Years: 30.00    Quit date: 05/16/1990  . Smokeless tobacco: Never Used     Comment: Quit in 1993  . Alcohol use No  . Drug use: No  .  Sexual activity: Not on file   Other Topics Concern  . Not on file   Social History Narrative   Has living will   Wife is health care POA   Would accept resuscitation but no prolonged artificial ventilation.   No tube feeds if cognitively unaware     Review of Systems: General: negative for chills, fever, night sweats or weight changes.  Cardiovascular: negative for chest pain, dyspnea on exertion, edema, orthopnea, palpitations, paroxysmal nocturnal dyspnea or shortness of breath Dermatological: negative for rash Respiratory: negative for cough or wheezing Urologic: negative for  hematuria Abdominal: negative for nausea, vomiting, diarrhea, bright red blood per rectum, melena, or hematemesis Neurologic: negative for visual changes, syncope, or dizziness All other systems reviewed and are otherwise negative except as noted above.    Blood pressure 132/62, pulse 71, height 5' 9.5" (1.765 m), weight 217 lb (98.4 kg).  General appearance: alert and no distress Neck: no adenopathy, no carotid bruit, no JVD, supple, symmetrical, trachea midline and thyroid not enlarged, symmetric, no tenderness/mass/nodules Lungs: clear to auscultation bilaterally Heart: regular rate and rhythm, S1, S2 normal, no murmur, click, rub or gallop Extremities: extremities normal, atraumatic, no cyanosis or edema  EKG sinus rhythm at 71 without ST or T-wave changes. I personally reviewed this EKG  ASSESSMENT AND PLAN:   Peripheral vascular disease (HCC) History of peripheral arterial disease status post angiography by myself 06/21/15 revealing long segment calcified bilateral SFA CTO's with three-vessel runoff. I ultimately performed diamondback orbital rotation atherectomy, PTCA and stenting of a highly calcified, physiologic significant right external iliac artery stenosis which improved his claudication. I then came back and was unsuccessful at revascularizing his right SFA percutaneously because of the long diffuse calcified nature of the stenosis. He continues to have limiting claudication. He failed a trial of Pletal. At this point, he has 3 options. We can continue to treat him conservatively, I can refer him to Aaron Sexton for another attempt at antegrade recanalization of his right SFA were femoropopliteal bypass grafting. We will continue to follow his lower extremity arterial Doppler studies. I will see him back in one year.      Aaron Gess MD FACP,FACC,FAHA, [facility] 07/11/2016 1:54 PM

## 2016-07-26 ENCOUNTER — Other Ambulatory Visit: Payer: Self-pay

## 2016-07-26 MED ORDER — CLOPIDOGREL BISULFATE 75 MG PO TABS: 75.0000 mg | ORAL_TABLET | Freq: Every day | ORAL | 10 refills | Status: DC

## 2016-08-07 ENCOUNTER — Other Ambulatory Visit: Payer: Self-pay | Admitting: Internal Medicine

## 2016-08-07 NOTE — Telephone Encounter (Signed)
Last filled 07-04-16 #60 Last OV 03-08-16 Acute Next OV 11-10-16

## 2016-08-07 NOTE — Telephone Encounter (Signed)
Left refill on voice mail at pharmacy  

## 2016-08-07 NOTE — Telephone Encounter (Signed)
Approved: #[phone] x 0 

## 2016-08-18 ENCOUNTER — Other Ambulatory Visit: Payer: Self-pay | Admitting: Cardiology

## 2016-09-04 ENCOUNTER — Other Ambulatory Visit: Payer: Self-pay | Admitting: Internal Medicine

## 2016-09-04 NOTE — Telephone Encounter (Signed)
Rx last filled 08/07/16-- please advise

## 2016-09-04 NOTE — Telephone Encounter (Signed)
Approved: #[phone] x 0 

## 2016-09-05 NOTE — Telephone Encounter (Signed)
Left refill on voice mail at pharmacy  

## 2016-09-25 ENCOUNTER — Ambulatory Visit (HOSPITAL_COMMUNITY)
Admission: RE | Admit: 2016-09-25 | Discharge: 2016-09-25 | Disposition: A | Discharge status: Home / Self Care | Payer: Medicare Other | Source: Ambulatory Visit | Attending: Cardiovascular Disease | Admitting: Cardiovascular Disease

## 2016-09-25 ENCOUNTER — Encounter (HOSPITAL_COMMUNITY): Payer: Medicare Other

## 2016-09-25 DIAGNOSIS — I708 Atherosclerosis of other arteries: Secondary | ICD-10-CM | POA: Diagnosis not present

## 2016-09-25 DIAGNOSIS — I745 Embolism and thrombosis of iliac artery: Secondary | ICD-10-CM | POA: Insufficient documentation

## 2016-09-25 DIAGNOSIS — I2510 Atherosclerotic heart disease of native coronary artery without angina pectoris: Secondary | ICD-10-CM

## 2016-09-25 DIAGNOSIS — I739 Peripheral vascular disease, unspecified: Secondary | ICD-10-CM

## 2016-09-27 ENCOUNTER — Other Ambulatory Visit: Payer: Self-pay | Admitting: Cardiovascular Disease

## 2016-09-27 DIAGNOSIS — I739 Peripheral vascular disease, unspecified: Secondary | ICD-10-CM

## 2016-10-05 ENCOUNTER — Other Ambulatory Visit: Payer: Self-pay | Admitting: Internal Medicine

## 2016-10-05 NOTE — Telephone Encounter (Signed)
Last filled 09-05-16 #60 Last OV/CPE 11-03-15 Next OV 11-10-16 No UDS on file

## 2016-10-05 NOTE — Telephone Encounter (Signed)
Approved: #60 x 0 UDS at next visit

## 2016-10-05 NOTE — Telephone Encounter (Signed)
Left refill on voice mail at pharmacy  

## 2016-10-26 ENCOUNTER — Other Ambulatory Visit: Payer: Self-pay | Admitting: Internal Medicine

## 2016-11-06 ENCOUNTER — Other Ambulatory Visit: Payer: Self-pay | Admitting: Internal Medicine

## 2016-11-06 NOTE — Telephone Encounter (Signed)
Last filled 10-05-16 #60 Last OV 08-06-16 Next OV 11-10-16  Forwarding to Dr Damita Dunnings in Dr Alla German absence. Please send back to me when approved/denied. Thanks

## 2016-11-06 NOTE — Telephone Encounter (Signed)
Please call in.  Thanks.   

## 2016-11-07 NOTE — Telephone Encounter (Signed)
Left refill on voice mail at pharmacy  

## 2016-11-10 ENCOUNTER — Encounter: Payer: Self-pay | Admitting: Internal Medicine

## 2016-11-10 ENCOUNTER — Ambulatory Visit (INDEPENDENT_AMBULATORY_CARE_PROVIDER_SITE_OTHER): Payer: Medicare Other | Admitting: Internal Medicine

## 2016-11-10 VITALS — BP 142/74 | HR 70 | Temp 97.9°F | Ht 68.0 in | Wt 220.0 lb

## 2016-11-10 DIAGNOSIS — I779 Disorder of arteries and arterioles, unspecified: Secondary | ICD-10-CM

## 2016-11-10 DIAGNOSIS — I739 Peripheral vascular disease, unspecified: Secondary | ICD-10-CM

## 2016-11-10 DIAGNOSIS — Z0000 Encounter for general adult medical examination without abnormal findings: Secondary | ICD-10-CM

## 2016-11-10 DIAGNOSIS — K219 Gastro-esophageal reflux disease without esophagitis: Secondary | ICD-10-CM | POA: Diagnosis not present

## 2016-11-10 DIAGNOSIS — E538 Deficiency of other specified B group vitamins: Secondary | ICD-10-CM

## 2016-11-10 DIAGNOSIS — Z7189 Other specified counseling: Secondary | ICD-10-CM | POA: Diagnosis not present

## 2016-11-10 DIAGNOSIS — Z1211 Encounter for screening for malignant neoplasm of colon: Secondary | ICD-10-CM | POA: Diagnosis not present

## 2016-11-10 DIAGNOSIS — I2510 Atherosclerotic heart disease of native coronary artery without angina pectoris: Secondary | ICD-10-CM | POA: Diagnosis not present

## 2016-11-10 LAB — COMPREHENSIVE METABOLIC PANEL
ALT: 17 U/L (ref 0–53)
AST: 14 U/L (ref 0–37)
Albumin: 4.2 g/dL (ref 3.5–5.2)
Alkaline Phosphatase: 37 U/L — ABNORMAL LOW (ref 39–117)
BUN: 20 mg/dL (ref 6–23)
CHLORIDE: 103 meq/L (ref 96–112)
CO2: 30 meq/L (ref 19–32)
Calcium: 9.5 mg/dL (ref 8.4–10.5)
Creatinine, Ser: 0.97 mg/dL (ref 0.40–1.50)
GFR: 80.72 mL/min (ref >60.00)
Glucose, Bld: 107 mg/dL — ABNORMAL HIGH (ref 70–99)
POTASSIUM: 3.8 meq/L (ref 3.5–5.1)
SODIUM: 140 meq/L (ref 135–145)
TOTAL PROTEIN: 7.6 g/dL (ref 6.0–8.3)
Total Bilirubin: 0.4 mg/dL (ref 0.2–1.2)

## 2016-11-10 LAB — LIPID PANEL
CHOLESTEROL: 84 mg/dL (ref 0–200)
HDL: 48.8 mg/dL (ref >39.00)
LDL Cholesterol: 23 mg/dL (ref 0–99)
NONHDL: 35.33
Total CHOL/HDL Ratio: 2
Triglycerides: 60 mg/dL (ref 0.0–149.0)
VLDL: 12 mg/dL (ref 0.0–40.0)

## 2016-11-10 LAB — CBC WITH DIFFERENTIAL/PLATELET
BASOS PCT: 0.7 % (ref 0.0–3.0)
Basophils Absolute: 0.1 10*3/uL (ref 0.0–0.1)
Eosinophils Absolute: 0.2 10*3/uL (ref 0.0–0.7)
Eosinophils Relative: 2.1 % (ref 0.0–5.0)
HCT: 36.5 % — ABNORMAL LOW (ref 39.0–52.0)
Hemoglobin: 12.1 g/dL — ABNORMAL LOW (ref 13.0–17.0)
LYMPHS ABS: 2.3 10*3/uL (ref 0.7–4.0)
Lymphocytes Relative: 27.1 % (ref 12.0–46.0)
MCHC: 33.2 g/dL (ref 30.0–36.0)
MONO ABS: 1 10*3/uL (ref 0.1–1.0)
MONOS PCT: 11.4 % (ref 3.0–12.0)
NEUTROS ABS: 5 10*3/uL (ref 1.4–7.7)
NEUTROS PCT: 58.7 % (ref 43.0–77.0)
Platelets: 310 10*3/uL (ref 150.0–400.0)
RBC: 3.23 Mil/uL — AB (ref 4.22–5.81)
RDW: 14.6 % (ref 11.5–15.5)
WBC: 8.5 10*3/uL (ref 4.0–10.5)

## 2016-11-10 LAB — VITAMIN B12: VITAMIN B 12: 1243 pg/mL — AB (ref 211–911)

## 2016-11-10 NOTE — Progress Notes (Signed)
Subjective:    Patient ID: Aaron Sexton, male    DOB: 1945/07/24, 71 y.o.   MRN: [id number]  HPI Here for Medicare wellness visit and follow up of chronic medical conditions Reviewed form and advanced directives Reviewed other doctors No tobacco or alcohol Doesn't exercise--discussed No falls No problems with vision or hearing No depression or anhedonia Independent with instrumental ADLs No apparent memory problems  Never got the colonoscopy done---concerned about the plavix Will check with Dr Allyson Sabal  Not having as much pain in his legs Walks for only brief periods Takes the tramadol bid most days  No chest pain No SOB No dizziness or syncope No edema No headaches No focal weakness, dysphagia, aphasia, facial droop, etc  Uses cimetidine only prn No regular heartburn No dysphagia  Current Outpatient Prescriptions on File Prior to Visit  Medication Sig Dispense Refill  . aspirin 81 MG tablet Take 81 mg by mouth daily.      . chlorthalidone (HYGROTON) 25 MG tablet TAKE 1/2 TABLET BY MOUTH DAILY 45 tablet 3  . cimetidine (TAGAMET) 400 MG tablet Take 400 mg by mouth 2 (two) times daily as needed (indigestion).    . clopidogrel (PLAVIX) 75 MG tablet Take 1 tablet (75 mg total) by mouth daily with breakfast. 30 tablet 10  . losartan (COZAAR) 25 MG tablet TAKE ONE TABLET BY MOUTH EVERY DAY 90 tablet 0  . metoprolol succinate (TOPROL-XL) 100 MG 24 hr tablet TAKE 1 TABLET BY MOUTH EVERY DAY 90 tablet 3  . nitroGLYCERIN (NITROSTAT) 0.4 MG SL tablet Place 1 tablet (0.4 mg total) under the tongue every 5 (five) minutes as needed for chest pain. 90 tablet 3  . Omega-3 Fatty Acids (FISH OIL PO) Take 1,000 mg by mouth daily.     . pravastatin (PRAVACHOL) 20 MG tablet TAKE 1 TABLET(20 MG) BY MOUTH DAILY 90 tablet 3  . traMADol (ULTRAM) 50 MG tablet TAKE 1 TABLET BY MOUTH EVERY 8 HOURS AS NEEDED FOR PAIN 60 tablet 0  . vitamin B-12 (CYANOCOBALAMIN) 1000 MCG tablet Take 1,000 mcg by  mouth daily.       No current facility-administered medications on file prior to visit.     Allergies  Allergen Reactions  . Diltiazem Hcl Hives and Rash    Past Medical History:  Diagnosis Date  . Arteriosclerotic cardiovascular disease (ASCVD) 1993   Critical RCA disease in 1993 treated with PTCA  . Cerebrovascular disease    Moderate ASVD without focal stenosis in 01/2007  . Colon polyps 3/12   single 2mm polyp--tubular adenoma  . Diverticulosis 2012   found on colonoscopy  . Erectile dysfunction   . Family history of adverse reaction to anesthesia    difficult for son & sistor to wake   . GERD (gastroesophageal reflux disease)   . Hematochezia 05/24/2010  . Hyperlipidemia   . Osteoarthritis    knees/hands-Dr Sherlean Foot  . Overweight(278.02)   . Peripheral vascular disease (HCC) 03/11/2010   Moderate SFA stenosis; history of claudication  . Rosacea   . Tobacco abuse, in remission    30-40 pack years discontinued in 1993  . Vitamin B12 deficiency     Past Surgical History:  Procedure Laterality Date  . APPENDECTOMY    . COLONOSCOPY W/ POLYPECTOMY  2012  . ILIAC VEIN ANGIOPLASTY / STENTING Right 08/09/2015  . PERIPHERAL VASCULAR CATHETERIZATION Bilateral 06/21/2015   Procedure: Lower Extremity Angiography;  Surgeon: Runell Gess, MD;  Location: [facility] INVASIVE CV LAB;  Service: Cardiovascular;  Laterality: Bilateral;  . PERIPHERAL VASCULAR CATHETERIZATION N/A 06/21/2015   Procedure: Abdominal Aortogram;  Surgeon: Runell Gess, MD;  Location: [facility] INVASIVE CV LAB;  Service: Cardiovascular;  Laterality: N/A;  . PERIPHERAL VASCULAR CATHETERIZATION N/A 07/26/2015   Procedure: Lower Extremity Angiography;  Surgeon: Runell Gess, MD;  Location: [facility] INVASIVE CV LAB;  Service: Cardiovascular;  Laterality: N/A;  . PERIPHERAL VASCULAR CATHETERIZATION N/A 08/09/2015   Procedure: Lower Extremity Angiography;  Surgeon: Runell Gess, MD;  Location: [facility] INVASIVE CV LAB;  Service:  Cardiovascular;  Laterality: N/A;  . PERIPHERAL VASCULAR CATHETERIZATION  08/09/2015   Procedure: Peripheral Vascular Intervention;  Surgeon: Runell Gess, MD;  Location: [facility] INVASIVE CV LAB;  Service: Cardiovascular;;  rt ext. iliac atherectomy and stent  . PERIPHERAL VASCULAR CATHETERIZATION N/A 09/20/2015   Procedure: Lower Extremity Angiography;  Surgeon: Runell Gess, MD;  Location: [facility] INVASIVE CV LAB;  Service: Cardiovascular;  Laterality: N/A;  . PERIPHERAL VASCULAR CATHETERIZATION Right 09/20/2015   Procedure: Peripheral Vascular Intervention;  Surgeon: Runell Gess, MD;  Location: [facility] INVASIVE CV LAB;  Service: Cardiovascular;  Laterality: Right;  SFA  . ROTATOR CUFF REPAIR Left 7/15   Dr Sherlean Foot    Family History  Problem Relation Age of Onset  . Hypertension Father        And siblings  . Heart disease Father        And second-degree relatives  . Transient ischemic attack Father   . Lung cancer Mother   . Cancer Sister        brain cancer    Social History   Social History  . Marital status: Married    Spouse name: N/A  . Number of children: N/A  . Years of education: N/A   Occupational History  . 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 Smoking status: Former Smoker    Packs/day: 1.00    Years: 30.00    Quit date: 05/16/1990  . Smokeless tobacco: Never Used     Comment: Quit in 1993  . Alcohol use No  . Drug use: No  . Sexual activity: Not on file   Other Topics Concern  . Not on file   Social History Narrative   Has living will   Wife is health care POA   Would accept resuscitation but no prolonged artificial ventilation.   No tube feeds if cognitively unaware   Review of Systems  Appetite is good Weight stable Sleeps well Wears seat belt Bowels are fine. No blood visible Voids okay. No problems with stream. No rash or suspicious lesions. Unchanged dark lesion on right arm  (keratosis) No sig back or joint pain Keeps up with dentist--[facility] program    Objective:   Physical Exam  Constitutional: He is oriented to person, place, and time. He appears well-nourished. No distress.  HENT:  Mouth/Throat: Oropharynx is clear and moist. No oropharyngeal exudate.  Neck: Normal range of motion. No thyromegaly present.  Cardiovascular: Normal rate, regular rhythm and normal heart sounds.  Exam reveals no gallop.   No murmur heard. Feet warm without palpable pulses  Pulmonary/Chest: Effort normal and breath sounds normal. No respiratory distress. He has no wheezes. He has no rales.  Abdominal: Soft. There is no tenderness.  Musculoskeletal: He exhibits no edema or tenderness.  Lymphadenopathy:    He has no cervical adenopathy.  Neurological: He is alert and oriented to  person, place, and time.  President--- "Marguarite Arbour Trump, Obama, Bush" 100-93-? [id number] Recall 3/3  Skin: No rash noted. No erythema.  Psychiatric: He has a normal mood and affect. His behavior is normal.          Assessment & Plan:

## 2016-11-10 NOTE — Assessment & Plan Note (Addendum)
No angina Keeps up with cardiologist  BP at home generally under 122 systolic A lot of stress now--will hold off on increase BP Readings from Last 3 Encounters:  11/10/16 (!) 142/74  07/11/16 132/62  04/10/16 139/72

## 2016-11-10 NOTE — Assessment & Plan Note (Signed)
On antiplatelet Rx and statin

## 2016-11-10 NOTE — Assessment & Plan Note (Signed)
I have personally reviewed the Medicare Annual Wellness questionnaire and have noted  1. The patient's medical and social history  2. Their use of alcohol, tobacco or illicit drugs  3. Their current medications and supplements  4. The patient's functional ability including ADL's, fall risks, home safety risks and hearing or visual              impairment.  5. Diet and physical activities  6. Evidence for depression or mood disorders  The patients weight, height, BMI and visual acuity have been recorded in the chart  I have made referrals, counseling and provided education to the patient based review of the above and I have provided the pt with a written personalized care plan for preventive services.   I have provided you with a copy of your personalized plan for preventive services. Please take the time to review along with your updated medication list.  Yearly flu vaccine Didn't get colonoscopy--afraid to stop plavix. Will do FIT No PSA due to age Valentino Saxon to walk

## 2016-11-10 NOTE — Assessment & Plan Note (Signed)
Okay with only occasional cimetidine

## 2016-11-10 NOTE — Assessment & Plan Note (Signed)
See social history 

## 2016-11-10 NOTE — Assessment & Plan Note (Signed)
Continues on supplement

## 2016-11-10 NOTE — Assessment & Plan Note (Signed)
Ongoing limitations due to PVD Uses the tramadol regularly

## 2016-11-14 ENCOUNTER — Encounter: Payer: Self-pay | Admitting: *Deleted

## 2016-11-20 ENCOUNTER — Other Ambulatory Visit (INDEPENDENT_AMBULATORY_CARE_PROVIDER_SITE_OTHER): Payer: Medicare Other

## 2016-11-20 DIAGNOSIS — Z1211 Encounter for screening for malignant neoplasm of colon: Secondary | ICD-10-CM

## 2016-11-20 LAB — FECAL OCCULT BLOOD, IMMUNOCHEMICAL: FECAL OCCULT BLD: NEGATIVE

## 2016-12-06 ENCOUNTER — Other Ambulatory Visit: Payer: Self-pay | Admitting: Internal Medicine

## 2016-12-06 NOTE — Telephone Encounter (Signed)
Approved: #[phone] x 0 

## 2016-12-06 NOTE — Telephone Encounter (Signed)
Left refill on voice mail at pharmacy  

## 2016-12-06 NOTE — Telephone Encounter (Signed)
Last filled 10-05-16 #60 Last OV 11-10-16 Next OV 11-16-17

## 2017-01-04 ENCOUNTER — Other Ambulatory Visit: Payer: Self-pay | Admitting: Internal Medicine

## 2017-01-04 NOTE — Telephone Encounter (Signed)
Last filled 12-06-16 #60 Last OV 11-10-16 Next OV 11-16-17

## 2017-01-04 NOTE — Telephone Encounter (Signed)
Left refill on voice mail at pharmacy  

## 2017-01-04 NOTE — Telephone Encounter (Signed)
Approved: #[phone] x 0 

## 2017-01-22 ENCOUNTER — Other Ambulatory Visit: Payer: Self-pay

## 2017-01-22 ENCOUNTER — Telehealth: Payer: Self-pay | Admitting: Cardiology

## 2017-01-22 ENCOUNTER — Other Ambulatory Visit: Payer: Self-pay | Admitting: Internal Medicine

## 2017-01-22 MED ORDER — NITROGLYCERIN 0.4 MG SL SUBL: 0.4000 mg | SUBLINGUAL_TABLET | SUBLINGUAL | 3 refills | Status: DC | PRN

## 2017-01-22 NOTE — Telephone Encounter (Signed)
°*  STAT* If patient is at the pharmacy, call can be transferred to refill team.   1. Which medications need to be refilled? (please list name of each medication and dose if known) nitroGLYCERIN (NITROSTAT) 0.4 MG SL tablet [[id number]]    2. Which pharmacy/location (including street and city if local pharmacy) is medication to be sent to? [place]    3. Do they need a 30 day or 90 day supply? 90 day

## 2017-01-22 NOTE — Telephone Encounter (Signed)
Done

## 2017-02-06 ENCOUNTER — Other Ambulatory Visit: Payer: Self-pay

## 2017-02-06 NOTE — Telephone Encounter (Signed)
Pt left vm  Requesting refill tramadol to [facility] [address]last refilled # 60 on 01/04/17; last annual 11/10/16. Pt thought he left request last week for tramadol; do not see request in chart and did not get phone message. I apologized to pt and he said would be OK to have called in on 02/07/17. Pt is out of tramadol.

## 2017-02-07 MED ORDER — TRAMADOL HCL 50 MG PO TABS: 50.0000 mg | ORAL_TABLET | Freq: Three times a day (TID) | ORAL | 0 refills | Status: DC | PRN

## 2017-02-07 NOTE — Telephone Encounter (Signed)
Approved: #[phone] x 0 

## 2017-02-07 NOTE — Telephone Encounter (Signed)
Left refill on voice mail at pharmacy  

## 2017-03-05 ENCOUNTER — Other Ambulatory Visit: Payer: Self-pay | Admitting: Internal Medicine

## 2017-03-05 NOTE — Telephone Encounter (Signed)
Approved: #[phone] x 0 

## 2017-03-05 NOTE — Telephone Encounter (Signed)
Last filled 02-07-17 #60 Last OV 11-11-26 Next OV 11-16-17

## 2017-03-05 NOTE — Telephone Encounter (Signed)
Left refill on voice mail at pharmacy  

## 2017-04-03 ENCOUNTER — Other Ambulatory Visit: Payer: Self-pay | Admitting: Internal Medicine

## 2017-04-03 NOTE — Telephone Encounter (Signed)
Approved: #[phone] x 0 

## 2017-04-03 NOTE — Telephone Encounter (Signed)
Last Rx 03/05/2017. Last OV 11/2016. pls advise

## 2017-04-03 NOTE — Telephone Encounter (Signed)
Rx called in to requested pharmacy 

## 2017-05-04 ENCOUNTER — Other Ambulatory Visit: Payer: Self-pay | Admitting: Internal Medicine

## 2017-05-04 NOTE — Telephone Encounter (Signed)
Left refill on voice mail at pharmacy  

## 2017-05-04 NOTE — Telephone Encounter (Signed)
Approved: #[phone] x 0 

## 2017-05-04 NOTE — Telephone Encounter (Signed)
Last filled 04-03-17 #60 Last OV 11-10-16 Next OV 11-16-17

## 2017-05-11 ENCOUNTER — Encounter: Payer: Self-pay | Admitting: Cardiology

## 2017-05-11 ENCOUNTER — Ambulatory Visit: Payer: Medicare Other | Admitting: Cardiology

## 2017-05-11 VITALS — BP 144/60 | HR 90 | Ht 68.0 in | Wt 219.0 lb

## 2017-05-11 DIAGNOSIS — I2510 Atherosclerotic heart disease of native coronary artery without angina pectoris: Secondary | ICD-10-CM | POA: Diagnosis not present

## 2017-05-11 DIAGNOSIS — E782 Mixed hyperlipidemia: Secondary | ICD-10-CM

## 2017-05-11 DIAGNOSIS — I6523 Occlusion and stenosis of bilateral carotid arteries: Secondary | ICD-10-CM | POA: Diagnosis not present

## 2017-05-11 DIAGNOSIS — I10 Essential (primary) hypertension: Secondary | ICD-10-CM

## 2017-05-11 NOTE — Progress Notes (Signed)
Clinical Summary Aaron Sexton is a 72 y.o.male seen today for follow up of the following medical problems.   1. CAD  - prior PTCA to RCA in 1993    - no recent chest pain. No recent SOB/DOE - compliant with meds.   2. Carotid stenosis  - Jan 2018 [place] 50-69%.  - no recent symptoms  3. Hyperlipidemia  - 11/2016 TC 84 TG 60 HDL 49 LDL 23 - compliant with meds  4. Claudication/PAD bilateral cramping pain in calves with walking at < 1 block. No pain at rest. No sores on feet. Not improved with pletal after increasing to 100mg  bid, he asked to stop this medication and we did - ABIs L 0.59 and R 0.68. Waveforms suggestive of inflow disease. .  - followed by Dr Gwenlyn Found.  - 09/20/15 failed attempt at percutanous revasc of right SFA CTO, recs for continued medical therapy.  - 08/09/15 stenting of right EIA - from notes prior failed attempt at left SFA stenting as well  - no recent leg pains.    5. HTN  - home bp's around 120-130/70-80s - remains compliant with meds   Past Medical History:  Diagnosis Date  . Arteriosclerotic cardiovascular disease (ASCVD) 1993   Critical RCA disease in 1993 treated with PTCA  . Cerebrovascular disease    Moderate ASVD without focal stenosis in 01/2007  . Colon polyps 3/12   single 37mm polyp--tubular adenoma  . Diverticulosis 2012   found on colonoscopy  . Erectile dysfunction   . Family history of adverse reaction to anesthesia    difficult for son & sistor to wake   . GERD (gastroesophageal reflux disease)   . Hematochezia 05/24/2010  . Hyperlipidemia   . Osteoarthritis    knees/hands-Dr Ronnie Derby  . Overweight(278.02)   . Peripheral vascular disease ([place]) 03/11/2010   Moderate SFA stenosis; history of claudication  . Rosacea   . Tobacco abuse, in remission    30-40 pack years discontinued in 1993  . Vitamin B12 deficiency      Allergies  Allergen Reactions  . Diltiazem Hcl Hives and Rash     Current Outpatient  Medications  Medication Sig Dispense Refill  . aspirin 81 MG tablet Take 81 mg by mouth daily.      . chlorthalidone (HYGROTON) 25 MG tablet TAKE 1/2 TABLET BY MOUTH DAILY 45 tablet 3  . cimetidine (TAGAMET) 400 MG tablet Take 400 mg by mouth 2 (two) times daily as needed (indigestion).    . clopidogrel (PLAVIX) 75 MG tablet Take 1 tablet (75 mg total) by mouth daily with breakfast. 30 tablet 10  . Cyanocobalamin (VITAMIN B-12) 500 MCG SUBL Place 500 mcg under the tongue daily.    [name] losartan (COZAAR) 25 MG tablet TAKE ONE TABLET BY MOUTH EVERY DAY 90 tablet 2  . metoprolol succinate (TOPROL-XL) 100 MG 24 hr tablet TAKE 1 TABLET BY MOUTH EVERY DAY 90 tablet 3  . nitroGLYCERIN (NITROSTAT) 0.4 MG SL tablet Place 1 tablet (0.4 mg total) under the tongue every 5 (five) minutes as needed for chest pain. 25 tablet 3  . Omega-3 Fatty Acids (FISH OIL PO) Take 1,000 mg by mouth daily.     . pravastatin (PRAVACHOL) 20 MG tablet TAKE 1 TABLET(20 MG) BY MOUTH DAILY 90 tablet 3  . traMADol (ULTRAM) 50 MG tablet TAKE 1 TABLET BY MOUTH EVERY 8 HOURS AS NEEDED FOR PAIN 60 tablet 0   No current facility-administered medications for this visit.  Past Surgical History:  Procedure Laterality Date  . APPENDECTOMY    . COLONOSCOPY W/ POLYPECTOMY  2012  . ILIAC VEIN ANGIOPLASTY / STENTING Right 08/09/2015  . PERIPHERAL VASCULAR CATHETERIZATION Bilateral 06/21/2015   Procedure: Lower Extremity Angiography;  Surgeon: Lorretta Harp, MD;  Location: [facility];  Service: Cardiovascular;  Laterality: Bilateral;  . PERIPHERAL VASCULAR CATHETERIZATION N/A 06/21/2015   Procedure: Abdominal Aortogram;  Surgeon: Lorretta Harp, MD;  Location: [facility];  Service: Cardiovascular;  Laterality: N/A;  . PERIPHERAL VASCULAR CATHETERIZATION N/A 07/26/2015   Procedure: Lower Extremity Angiography;  Surgeon: Lorretta Harp, MD;  Location: [facility];  Service: Cardiovascular;  Laterality: N/A;  .  PERIPHERAL VASCULAR CATHETERIZATION N/A 08/09/2015   Procedure: Lower Extremity Angiography;  Surgeon: Lorretta Harp, MD;  Location: [facility];  Service: Cardiovascular;  Laterality: N/A;  . PERIPHERAL VASCULAR CATHETERIZATION  08/09/2015   Procedure: Peripheral Vascular Intervention;  Surgeon: Lorretta Harp, MD;  Location: [facility];  Service: Cardiovascular;;  rt ext. iliac atherectomy and stent  . PERIPHERAL VASCULAR CATHETERIZATION N/A 09/20/2015   Procedure: Lower Extremity Angiography;  Surgeon: Lorretta Harp, MD;  Location: [facility];  Service: Cardiovascular;  Laterality: N/A;  . PERIPHERAL VASCULAR CATHETERIZATION Right 09/20/2015   Procedure: Peripheral Vascular Intervention;  Surgeon: Lorretta Harp, MD;  Location: [facility];  Service: Cardiovascular;  Laterality: Right;  SFA  . ROTATOR CUFF REPAIR Left 7/15   Dr Ronnie Derby     Allergies  Allergen Reactions  . Diltiazem Hcl Hives and Rash      Family History  Problem Relation Age of Onset  . Hypertension Father        And siblings  . Heart disease Father        And second-degree relatives  . Transient ischemic attack Father   . Lung cancer Mother   . Cancer Sister        brain cancer     Social History Aaron Sexton reports that he quit smoking about 27 years ago. He has a 30.00 pack-year smoking history. he has never used smokeless tobacco. Aaron Sexton reports that he does not drink alcohol.   Review of Systems CONSTITUTIONAL: No weight loss, fever, chills, weakness or fatigue.  HEENT: Eyes: No visual loss, blurred vision, double vision or yellow sclerae.No hearing loss, sneezing, congestion, runny nose or sore throat.  SKIN: No rash or itching.  CARDIOVASCULAR: per hpi RESPIRATORY: No shortness of breath, cough or sputum.  GASTROINTESTINAL: No anorexia, nausea, vomiting or diarrhea. No abdominal pain or blood.  GENITOURINARY: No burning on urination, no polyuria NEUROLOGICAL:  No headache, dizziness, syncope, paralysis, ataxia, numbness or tingling in the extremities. No change in bowel or bladder control.  MUSCULOSKELETAL: No muscle, back pain, joint pain or stiffness.  LYMPHATICS: No enlarged nodes. No history of splenectomy.  PSYCHIATRIC: No history of depression or anxiety.  ENDOCRINOLOGIC: No reports of sweating, cold or heat intolerance. No polyuria or polydipsia.  [name]   Physical Examination Vitals:   05/11/17 0918  BP: (!) 144/60  Pulse: 90  SpO2: 98%   Vitals:   05/11/17 0918  Weight: 219 lb (99.3 kg)  Height: 5\' 8"  (1.727 m)    Gen: resting comfortably, no acute distress HEENT: no scleral icterus, pupils equal round and reactive, no palptable cervical adenopathy,  CV: RRR, no m/rg, no jvd Resp: Clear to auscultation bilaterally GI: abdomen is soft, non-tender, non-distended,  normal bowel sounds, no hepatosplenomegaly MSK: extremities are warm, no edema.  Skin: warm, no rash Neuro:  no focal deficits Psych: appropriate affect   Diagnostic Studies 08/2011 Carotid US:  IMPRESSION: No significant change in the bilateral carotid atherosclerosis.  Right ICA narrowing less than 50%.  Moderate left ICA narrowing, 50-69%. Pertinent labs  08/2012: TC 84 TG 71 HDL 40 LDL 29 Na 135 BUN 25 Cr 1.1 Hgb 12.9 Hct 39.3 Plt 280 AST 23 ALT 31 TSH 0.51   03/14/13 Clinic EKG: sinus rhythm, normal axis, no ischemic changes   03/2009 ABI IMPRESSION: Similar pattern of arterial occlusive disease in both lower extremities. Ankle-brachial indices are moderately depressed at rest with segmental evaluation consistent with bilateral SFA occlusive disease and potential component of proximal inflow disease. Further anatomic delineation would be helpful with a study such as CT angiography to determine exact nature of occlusive disease.  08/2013 ABI FINDINGS:  Right ABI: 0.68  Left ABI: 0.59  Right Lower Extremity: Significantly decreased pressure  between the  right brachial pressure and right upper thigh pressure suggests  significant inflow disease. No focal pressure drop between the upper  and lower thigh, or below the knee. Moderately abnormal PVRs from  the thigh to the metatarsals consistent with inflow disease.  Left Lower Extremity: Significantly decreased pressure between the  left brachial pressure and left upper thigh consistent with inflow  disease. No significant pressure drop between the upper thigh and  lower thigh or below the knee. Moderately abnormal PVRs throughout  the left lower extremity consistent with inflow disease.  IMPRESSION:  1. Abnormal bilateral ankle-brachial indices consistent with  moderate peripheral arterial disease which is slightly improved  compared to 03/15/2009. Query interval history of SFA intervention,  or successful exercise program?  2. Based on today's evaluation, the level of disease appears to be  inflow (aortoiliac) bilaterally.  Signed,  Criselda Peaches, MD  [facility]  [facility]   08/2013 Carotid US IMPRESSION:  1. Interval progression of mild right ICA stenosis from less than  50% to an estimated 50- 69%.  2. No significant interval change in moderate (50-69%) left ICA  narrowing.  3. Vertebral arteries are patent with normal antegrade flow.  Signed,   03/2015 Carotid US Right:  Heterogeneous plaque at the carotid bifurcation, with discordant results regarding degree of stenosis. Peak velocity suggests 50%- 69% stenosis, with the ICA/ CCA ratio suggesting a lesser degree of stenosis. If establishing a more accurate degree of stenosis is desired, cerebral angiogram should be considered, or as a second best test, CTA.  Left:  Heterogeneous plaque at the carotid bifurcation, with discordant results regarding degree of stenosis. Peak velocity suggests 50% -69% stenosis, with the  ICA/ CCA ratio suggesting a lesser degree of stenosis. If establishing a more accurate degree of stenosis is desired, cerebral angiogram should be considered, or as a second best test, CTA.    Assessment and Plan  1. CAD  - remains asymptomatic, continue current meds  2. Carotid stenosis  - asymptomatic. Repeat carotid US.   3. Hyperlipidemia  - he is at goal, continue current meds  4. Claudication  - continue to follow with Dr Gwenlyn Found   5. HTN  - elevated in clinic, home numbers at goal - continue current meds     Aaron Sexton, M.D.

## 2017-05-11 NOTE — Patient Instructions (Signed)
Medication Instructions:  Your physician recommends that you continue on your current medications as directed. Please refer to the Current Medication list given to you today.   Labwork: NONE  Testing/Procedures: Your physician has requested that you have a carotid duplex. This test is an ultrasound of the carotid arteries in your neck. It looks at blood flow through these arteries that supply the brain with blood. Allow one hour for this exam. There are no restrictions or special instructions.    Follow-Up: Your physician wants you to follow-up in: 1 YEAR.  You will receive a reminder letter in the mail two months in advance. If you don't receive a letter, please call our office to schedule the follow-up appointment.   Any Other Special Instructions Will Be Listed Below (If Applicable).     If you need a refill on your cardiac medications before your next appointment, please call your pharmacy.   

## 2017-05-16 ENCOUNTER — Encounter: Payer: Self-pay | Admitting: Cardiology

## 2017-05-17 ENCOUNTER — Ambulatory Visit (HOSPITAL_COMMUNITY)
Admission: RE | Admit: 2017-05-17 | Discharge: 2017-05-17 | Disposition: A | Discharge status: Home / Self Care | Payer: Medicare Other | Source: Ambulatory Visit | Attending: Cardiology | Admitting: Cardiology

## 2017-05-17 DIAGNOSIS — I6523 Occlusion and stenosis of bilateral carotid arteries: Secondary | ICD-10-CM | POA: Insufficient documentation

## 2017-05-31 ENCOUNTER — Other Ambulatory Visit: Payer: Self-pay | Admitting: Internal Medicine

## 2017-05-31 NOTE — Telephone Encounter (Signed)
Last filled 05-04-17 #60 Last OV 11-10-16 Next OV 11-16-17

## 2017-06-22 ENCOUNTER — Other Ambulatory Visit: Payer: Self-pay | Admitting: Cardiovascular Disease

## 2017-06-22 NOTE — Telephone Encounter (Signed)
REFILL 

## 2017-06-29 ENCOUNTER — Other Ambulatory Visit: Payer: Self-pay | Admitting: Internal Medicine

## 2017-06-29 NOTE — Telephone Encounter (Signed)
Last filled 05-31-17 #60  Last OV 11-10-16 Next OV 11-16-17

## 2017-07-24 ENCOUNTER — Encounter: Payer: Self-pay | Admitting: Cardiovascular Disease

## 2017-07-24 ENCOUNTER — Ambulatory Visit: Payer: Medicare Other | Admitting: Cardiovascular Disease

## 2017-07-24 VITALS — BP 132/64 | HR 70 | Ht 70.0 in | Wt 218.0 lb

## 2017-07-24 DIAGNOSIS — I2510 Atherosclerotic heart disease of native coronary artery without angina pectoris: Secondary | ICD-10-CM

## 2017-07-24 DIAGNOSIS — I6523 Occlusion and stenosis of bilateral carotid arteries: Secondary | ICD-10-CM

## 2017-07-24 DIAGNOSIS — I739 Peripheral vascular disease, unspecified: Secondary | ICD-10-CM

## 2017-07-24 NOTE — Progress Notes (Signed)
07/24/2017 Aaron Sexton   1945-07-12  [id number]  Primary Physician Karie Schwalbe, MD Primary Cardiologist: Runell Gess MD Nicholes Calamity, [place]  HPI:  Aaron Sexton is a 72 y.o.  mildly overweight married Caucasian male father of 3, grandfather and 3 grandchildren referred by [name] registered nurse practitioner for evaluation of peripheral arterial disease. His cardiologist is Dr. Dina Rich. i last saw him in the office 10/15/15. He has a history of hypertension and hyperlipidemia. I performed angioplasty of his right coronary artery back in 1994 and he's been a symptomatically since. He stopped smoking at that time. 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 He complains of worsening left greater than right lower extremity claudication. Dopplers performed at [facility] 09/11/13 revealed a right ABI 0.68 and a left ABI 0.59. There was a high-frequency signal in the right external iliac artery. Angiography recently performed showed a high-grade calcific//exophytic plaque in the right external iliac artery with a 30 mm gradient. 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 I failed to cross the highly calcified left SFA CTO however.I attempted right SFA intervention 09/20/15 unsuccessfully He does have mild lifestyle limiting claudication but this is not severe.   I saw him a year ago. He still has moderate bilateral calf claudication which is lifestyle limiting. He did tell try also Pletal. I'm going to refer him to Dr. Hoy Finlay at [facility] in [place] for attempted bilateral SFA endovascular revascularization.     Current Meds  Medication Sig  . aspirin 81 MG tablet Take 81 mg by mouth daily.    . chlorthalidone (HYGROTON) 25 MG tablet TAKE 1/2 TABLET BY MOUTH DAILY  . cimetidine (TAGAMET) 400 MG tablet  Take 400 mg by mouth 2 (two) times daily as needed (indigestion).  . clopidogrel (PLAVIX) 75 MG tablet TAKE 1 TABLET(75 MG) BY MOUTH DAILY WITH BREAKFAST  . Cyanocobalamin (VITAMIN B-12) 500 MCG SUBL Place 500 mcg under the tongue daily.  [name] losartan (COZAAR) 25 MG tablet TAKE ONE TABLET BY MOUTH EVERY DAY  . metoprolol succinate (TOPROL-XL) 100 MG 24 hr tablet TAKE 1 TABLET BY MOUTH EVERY DAY  . nitroGLYCERIN (NITROSTAT) 0.4 MG SL tablet Place 1 tablet (0.4 mg total) under the tongue every 5 (five) minutes as needed for chest pain.  . Omega-3 Fatty Acids (FISH OIL PO) Take 1,000 mg by mouth daily.   . pravastatin (PRAVACHOL) 20 MG tablet TAKE 1 TABLET(20 MG) BY MOUTH DAILY  . traMADol (ULTRAM) 50 MG tablet TAKE 1 TABLET BY MOUTH EVERY 8 HOURS AS NEEDED FOR PAIN     Allergies  Allergen Reactions  . Diltiazem Hcl Hives and Rash    Social History   Socioeconomic History  . Marital status: Married    Spouse name: Not on file  . Number of children: Not on file  . Years of education: Not on file  . Highest education level: Not on file  Social Needs  . Financial resource strain: Not on file  . Food insecurity - worry: Not on file  . Food insecurity - inability: Not on file  . Transportation needs - medical: Not on file  . Transportation needs - non-medical: Not on file  Occupational History  . 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 Smoking  status: Former Smoker    Packs/day: 1.00    Years: 30.00    Pack years: 30.00    Last attempt to quit: 05/16/1990    Years since quitting: 27.2  . Smokeless tobacco: Never Used  . Tobacco comment: Quit in 1993  Substance and Sexual Activity  . Alcohol use: No    Alcohol/week: 0.0 oz  . Drug use: No  . Sexual activity: Not on file  Other Topics Concern  . Not on file  Social History Narrative   Has living will   Wife is health care POA   Would accept resuscitation but no  prolonged artificial ventilation.   No tube feeds if cognitively unaware     Review of Systems: General: negative for chills, fever, night sweats or weight changes.  Cardiovascular: negative for chest pain, dyspnea on exertion, edema, orthopnea, palpitations, paroxysmal nocturnal dyspnea or shortness of breath Dermatological: negative for rash Respiratory: negative for cough or wheezing Urologic: negative for hematuria Abdominal: negative for nausea, vomiting, diarrhea, bright red blood per rectum, melena, or hematemesis Neurologic: negative for visual changes, syncope, or dizziness All other systems reviewed and are otherwise negative except as noted above.    Blood pressure 132/64, pulse 70, height 5\' 10"  (1.778 m), weight 218 lb (98.9 kg).  General appearance: alert and no distress Neck: no adenopathy, no carotid bruit, no JVD, supple, symmetrical, trachea midline and thyroid not enlarged, symmetric, no tenderness/mass/nodules Lungs: clear to auscultation bilaterally Heart: regular rate and rhythm, S1, S2 normal, no murmur, click, rub or gallop Extremities: extremities normal, atraumatic, no cyanosis or edema Pulses: absent pedal pulses bilaterally Skin: Skin color, texture, turgor normal. No rashes or lesions Neurologic: Alert and oriented X 3, normal strength and tone. Normal symmetric reflexes. Normal coordination and gait  EKG /rhythm at 70 without ST or T-wave changes. I personally reviewed this EKG  ASSESSMENT AND PLAN:   Claudication [facility]) History of peripheral arterial disease status post diamond that lower facial atherectomy, PTA and stenting of the highly calcifiedright external iliac artery back in 2017. I had to tell the attempts at crossing his right and left SFA which are highly calcified. He felt Pletal. M dinner for him to Dr. Hoy Finlay at [facility] for attempted SFA revascularizationin the near future.      Runell Gess MD FACP,FACC,FAHA,  [facility] 07/24/2017 4:15 PM

## 2017-07-24 NOTE — Assessment & Plan Note (Signed)
History of peripheral arterial disease status post diamond that lower facial atherectomy, PTA and stenting of the highly calcifiedright external iliac artery back in 2017. I had to tell the attempts at crossing his right and left SFA which are highly calcified. He felt Pletal. M dinner for him to Dr. [name] at [facility] for attempted SFA revascularizationin the near future.

## 2017-07-24 NOTE — Patient Instructions (Addendum)
Medication Instructions: Your physician recommends that you continue on your current medications as directed. Please refer to the Current Medication list given to you today.   Follow-Up: You have been referred to Dr. [name] at [facility] in [place] for evaluation for SFA Intervention.  Your physician wants you to follow-up in: 1 year with Dr. Gwenlyn Found. You will receive a reminder letter in the mail two months in advance. If you don't receive a letter, please call our office to schedule the follow-up appointment.  If you need a refill on your cardiac medications before your next appointment, please call your pharmacy.

## 2017-07-26 ENCOUNTER — Other Ambulatory Visit: Payer: Self-pay | Admitting: Cardiology

## 2017-07-26 ENCOUNTER — Other Ambulatory Visit: Payer: Self-pay | Admitting: Internal Medicine

## 2017-07-26 MED ORDER — CHLORTHALIDONE 25 MG PO TABS: 12.5000 mg | ORAL_TABLET | Freq: Every day | ORAL | 3 refills | Status: DC

## 2017-07-26 NOTE — Telephone Encounter (Signed)
Wellness appt scheduled for 11/16/17, last filled on 06/29/17 #60 tabs with 0 refills

## 2017-08-02 ENCOUNTER — Telehealth: Payer: Self-pay | Admitting: Cardiology

## 2017-08-02 NOTE — Telephone Encounter (Signed)
Called Aaron Sexton at [phone] to check on appt.  The patient is scheduled to see Dr. [name] on 08-31-17 at 1 p.m.

## 2017-08-20 ENCOUNTER — Ambulatory Visit: Payer: Medicare Other | Admitting: Family Medicine

## 2017-08-20 ENCOUNTER — Telehealth: Payer: Self-pay

## 2017-08-20 NOTE — Telephone Encounter (Signed)
PLEASE NOTE: All timestamps contained within this report are represented as [place] Standard Time. CONFIDENTIALTY NOTICE: This fax transmission is intended only for the addressee. It contains information that is legally privileged, confidential or otherwise protected from use or disclosure. If you are not the intended recipient, you are strictly prohibited from reviewing, disclosing, copying using or disseminating any of this information or taking any action in reliance on or regarding this information. If you have received this fax in error, please notify us immediately by telephone so that we can arrange for its return to [place]. Phone: [phone], Toll-Free: [phone], Fax: [phone] Page: 1 of 1 Call Id: [id number] [place] Night - Client Nonclinical Telephone Record [place] Night - Client Client Site [place] Physician Viviana Simpler - MD Contact Type Call Who Is Calling Patient / Member / Family / Caregiver Caller Name Aaron Sexton Caller Phone Number [phone] Patient Name Aaron Sexton Patient DOB 30-Aug-1945 Call Type Message Only Information Provided Reason for Call Request to [place] Appointment Initial Comment Caller states her husband is in the ER in [place], needing to cancel appointment 08/20/2017 at 3:45pm. Additional Comment Call Closed By: Nancy Nordmann Transaction Date/Time: 08/17/2017 7:41:57 PM (ET)

## 2017-09-03 ENCOUNTER — Other Ambulatory Visit: Payer: Self-pay | Admitting: Internal Medicine

## 2017-09-03 NOTE — Telephone Encounter (Signed)
Last filled 07-26-17 #60 Last OV 11-10-16 Next OV 09-07-17

## 2017-09-07 ENCOUNTER — Ambulatory Visit: Payer: Medicare Other | Admitting: Internal Medicine

## 2017-09-07 ENCOUNTER — Encounter: Payer: Self-pay | Admitting: Internal Medicine

## 2017-09-07 VITALS — BP 122/68 | HR 78 | Temp 97.5°F | Resp 22 | Ht 70.0 in | Wt 206.0 lb

## 2017-09-07 DIAGNOSIS — I739 Peripheral vascular disease, unspecified: Secondary | ICD-10-CM | POA: Diagnosis not present

## 2017-09-07 DIAGNOSIS — J181 Lobar pneumonia, unspecified organism: Secondary | ICD-10-CM | POA: Diagnosis not present

## 2017-09-07 LAB — COMPREHENSIVE METABOLIC PANEL
ALK PHOS: 45 U/L (ref 39–117)
ALT: 15 U/L (ref 0–53)
AST: 18 U/L (ref 0–37)
Albumin: 3.7 g/dL (ref 3.5–5.2)
BILIRUBIN TOTAL: 0.4 mg/dL (ref 0.2–1.2)
BUN: 19 mg/dL (ref 6–23)
CO2: 29 mEq/L (ref 19–32)
Calcium: 9.2 mg/dL (ref 8.4–10.5)
Chloride: 100 mEq/L (ref 96–112)
Creatinine, Ser: 0.9 mg/dL (ref 0.40–1.50)
GFR: 87.81 mL/min (ref >60.00)
GLUCOSE: 96 mg/dL (ref 70–99)
POTASSIUM: 3.9 meq/L (ref 3.5–5.1)
Sodium: 137 mEq/L (ref 135–145)
Total Protein: 7.4 g/dL (ref 6.0–8.3)

## 2017-09-07 LAB — CBC
HCT: 31 % — ABNORMAL LOW (ref 39.0–52.0)
Hemoglobin: 10.3 g/dL — ABNORMAL LOW (ref 13.0–17.0)
MCHC: 33.3 g/dL (ref 30.0–36.0)
MCV: 112.9 fl — ABNORMAL HIGH (ref 78.0–100.0)
PLATELETS: 335 10*3/uL (ref 150.0–400.0)
RBC: 2.74 Mil/uL — ABNORMAL LOW (ref 4.22–5.81)
RDW: 16 % — AB (ref 11.5–15.5)
WBC: 7.6 10*3/uL (ref 4.0–10.5)

## 2017-09-07 LAB — SEDIMENTATION RATE: Sed Rate: 77 mm/h — ABNORMAL HIGH (ref 0–20)

## 2017-09-07 NOTE — Assessment & Plan Note (Signed)
Will do handicapped permit

## 2017-09-07 NOTE — Progress Notes (Signed)
Subjective:    Patient ID: Aaron Sexton, male    DOB: December 07, 1945, 72 y.o.   MRN: [id number]  HPI Here for hospital follow up  Was in hospital for 7-8 days in [place] [facility]) Visiting daughter Had been coughing and fever. No SOB but couldn't eat To ER-- diagnosed with pneumonia in both lungs with sepsis IV antibiotics and oral levofloxacin for another 5 days Now taking probiotic Seen by ID when in hospital--and then once as outpatient Was told the blood work was "off the chart" (??sed rate) A repeat CXR was not any better ---but only 1 week later  No fever No SOB Only a little cough  Current Outpatient Medications on File Prior to Visit  Medication Sig Dispense Refill  . aspirin 81 MG tablet Take 81 mg by mouth daily.      . chlorthalidone (HYGROTON) 25 MG tablet Take 0.5 tablets (12.5 mg total) by mouth daily. 45 tablet 3  . cimetidine (TAGAMET) 400 MG tablet Take 400 mg by mouth 2 (two) times daily as needed (indigestion).    . clopidogrel (PLAVIX) 75 MG tablet TAKE 1 TABLET(75 MG) BY MOUTH DAILY WITH BREAKFAST 30 tablet 11  . Cyanocobalamin (VITAMIN B-12) 500 MCG SUBL Place 500 mcg under the tongue daily.    [name] losartan (COZAAR) 25 MG tablet TAKE ONE TABLET BY MOUTH EVERY DAY 90 tablet 2  . metoprolol succinate (TOPROL-XL) 100 MG 24 hr tablet TAKE 1 TABLET BY MOUTH EVERY DAY 90 tablet 0  . nitroGLYCERIN (NITROSTAT) 0.4 MG SL tablet Place 1 tablet (0.4 mg total) under the tongue every 5 (five) minutes as needed for chest pain. 25 tablet 3  . Omega-3 Fatty Acids (FISH OIL PO) Take 1,000 mg by mouth daily.     . pravastatin (PRAVACHOL) 20 MG tablet TAKE 1 TABLET(20 MG) BY MOUTH DAILY 90 tablet 0  . Probiotic Product (PROBIOTIC PO) Take by mouth.    . traMADol (ULTRAM) 50 MG tablet TAKE 1 TABLET BY MOUTH EVERY 8 HOURS AS NEEDED FOR PAIN 60 tablet 0   No current facility-administered medications on file prior to visit.     Allergies  Allergen Reactions    . Diltiazem Hcl Hives and Rash    Past Medical History:  Diagnosis Date  . Arteriosclerotic cardiovascular disease (ASCVD) 1993   Critical RCA disease in 1993 treated with PTCA  . Cerebrovascular disease    Moderate ASVD without focal stenosis in 01/2007  . Colon polyps 3/12   single 2mm polyp--tubular adenoma  . Diverticulosis 2012   found on colonoscopy  . Erectile dysfunction   . Family history of adverse reaction to anesthesia    difficult for son & sistor to wake   . GERD (gastroesophageal reflux disease)   . Hematochezia 05/24/2010  . Hyperlipidemia   . Osteoarthritis    knees/hands-Dr Sherlean Foot  . Overweight(278.02)   . Peripheral vascular disease (HCC) 03/11/2010   Moderate SFA stenosis; history of claudication  . Rosacea   . Tobacco abuse, in remission    30-40 pack years discontinued in 1993  . Vitamin B12 deficiency     Past Surgical History:  Procedure Laterality Date  . APPENDECTOMY    . COLONOSCOPY W/ POLYPECTOMY  2012  . ILIAC VEIN ANGIOPLASTY / STENTING Right 08/09/2015  . PERIPHERAL VASCULAR CATHETERIZATION Bilateral 06/21/2015   Procedure: Lower Extremity Angiography;  Surgeon: Runell Gess, MD;  Location: [facility] INVASIVE CV LAB;  Service: Cardiovascular;  Laterality: Bilateral;  .  PERIPHERAL VASCULAR CATHETERIZATION N/A 06/21/2015   Procedure: Abdominal Aortogram;  Surgeon: Runell Gess, MD;  Location: [facility] INVASIVE CV LAB;  Service: Cardiovascular;  Laterality: N/A;  . PERIPHERAL VASCULAR CATHETERIZATION N/A 07/26/2015   Procedure: Lower Extremity Angiography;  Surgeon: Runell Gess, MD;  Location: [facility] INVASIVE CV LAB;  Service: Cardiovascular;  Laterality: N/A;  . PERIPHERAL VASCULAR CATHETERIZATION N/A 08/09/2015   Procedure: Lower Extremity Angiography;  Surgeon: Runell Gess, MD;  Location: [facility] INVASIVE CV LAB;  Service: Cardiovascular;  Laterality: N/A;  . PERIPHERAL VASCULAR CATHETERIZATION  08/09/2015   Procedure: Peripheral Vascular Intervention;   Surgeon: Runell Gess, MD;  Location: [facility] INVASIVE CV LAB;  Service: Cardiovascular;;  rt ext. iliac atherectomy and stent  . PERIPHERAL VASCULAR CATHETERIZATION N/A 09/20/2015   Procedure: Lower Extremity Angiography;  Surgeon: Runell Gess, MD;  Location: [facility] INVASIVE CV LAB;  Service: Cardiovascular;  Laterality: N/A;  . PERIPHERAL VASCULAR CATHETERIZATION Right 09/20/2015   Procedure: Peripheral Vascular Intervention;  Surgeon: Runell Gess, MD;  Location: [facility] INVASIVE CV LAB;  Service: Cardiovascular;  Laterality: Right;  SFA  . ROTATOR CUFF REPAIR Left 7/15   Dr Sherlean Foot    Family History  Problem Relation Age of Onset  . Hypertension Father        And siblings  . Heart disease Father        And second-degree relatives  . Transient ischemic attack Father   . Lung cancer Mother   . Cancer Sister        brain cancer    Social History   Socioeconomic History  . Marital status: Married    Spouse name: Not on file  . Number of children: Not on file  . Years of education: Not on file  . Highest education level: Not on file  Occupational History  . 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 Financial resource strain: Not on file  . Food insecurity:    Worry: Not on file    Inability: Not on file  . Transportation needs:    Medical: Not on file    Non-medical: Not on file  Tobacco Use  . Smoking status: Former Smoker    Packs/day: 1.00    Years: 30.00    Pack years: 30.00    Last attempt to quit: 05/16/1990    Years since quitting: 27.3  . Smokeless tobacco: Never Used  . Tobacco comment: Quit in 1993  Substance and Sexual Activity  . Alcohol use: No    Alcohol/week: 0.0 oz  . Drug use: No  . Sexual activity: Not on file  Lifestyle  . Physical activity:    Days per week: Not on file    Minutes per session: Not on file  . Stress: Not on file  Relationships  . Social connections:    Talks on  phone: Not on file    Gets together: Not on file    Attends religious service: Not on file    Active member of club or organization: Not on file    Attends meetings of clubs or organizations: Not on file    Relationship status: Not on file  . Intimate partner violence:    Fear of current or ex partner: Not on file    Emotionally abused: Not on file    Physically abused: Not on file    Forced sexual activity: Not on file  Other Topics Concern  .  Not on file  Social History Narrative   Has living will   Wife is health care POA   Would accept resuscitation but no prolonged artificial ventilation.   No tube feeds if cognitively unaware   Review of Systems Bad diarrhea with the antibiotics--better now. Eating better now Has lost some weight--even before the illness    Objective:   Physical Exam  Constitutional: He appears well-developed. No distress.  HENT:  Mouth/Throat: Oropharynx is clear and moist. No oropharyngeal exudate.  Neck: No thyromegaly present.  Cardiovascular: Normal rate, regular rhythm and normal heart sounds. Exam reveals no gallop.  No murmur heard. Pulmonary/Chest: Effort normal. No stridor. No respiratory distress. He has no wheezes.  No dullness to suggest effusion Slight left basilar crackles  Musculoskeletal: He exhibits no edema.  Lymphadenopathy:    He has no cervical adenopathy.  Psychiatric: He has a normal mood and affect. His behavior is normal.          Assessment & Plan:

## 2017-09-07 NOTE — Assessment & Plan Note (Signed)
Still some findings in LLL and told it was bilateral Will recheck labs Didn't get the hospital records ---will request them now Needs repeat CXR sometime--- has follow up in 2 months (will do this sooner if any worrisome findings on the initial CXR from the hospital) Done with the antibiotics Still mild tachypnea---discussed taking it easy over the next 2-3 weeks

## 2017-09-12 IMAGING — US US CAROTID DUPLEX BILAT
1 series · 13 of 24 positions shown · non-contrast
Comparison: 03/22/2015 .

CLINICAL DATA: Carotid artery stenosis.

EXAM:
BILATERAL CAROTID DUPLEX ULTRASOUND
TECHNIQUE: Gray scale imaging, color Doppler and duplex ultrasound were
performed of bilateral carotid and vertebral arteries in the neck.

[Series 1: us carotid duplex bilat · 0.06mm/px · 13 of 88 slices shown]
[im 1/88]
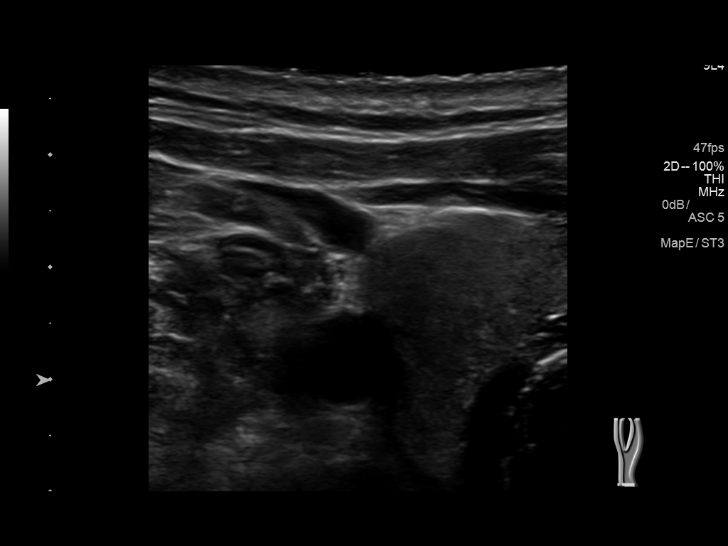
[im 8/88]
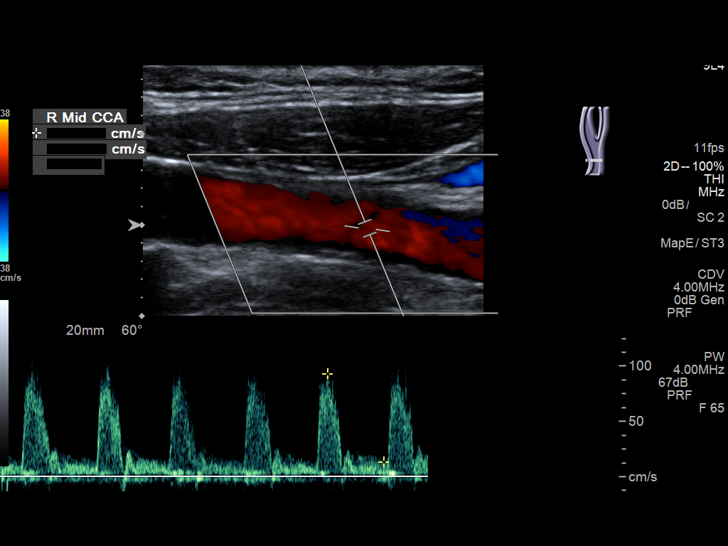
[im 16/88]
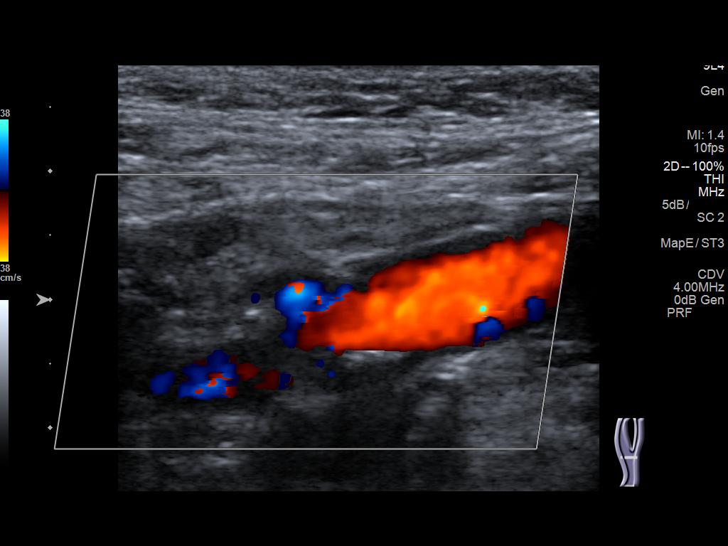
[im 23/88]
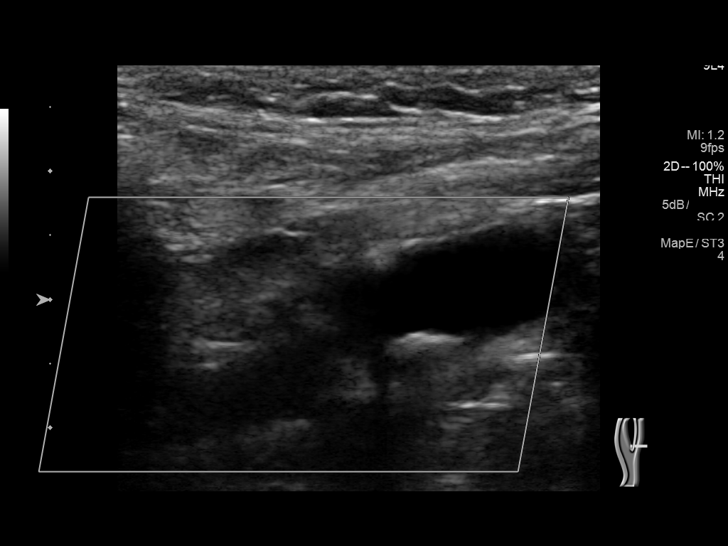
[im 31/88]
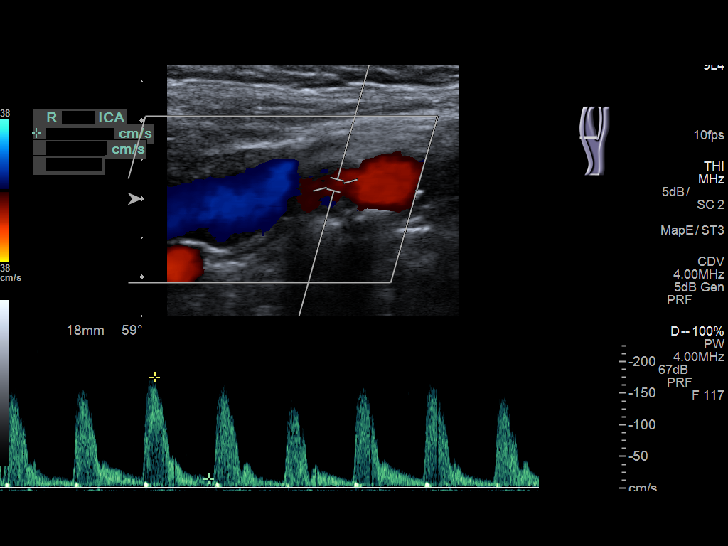
[im 38/88]
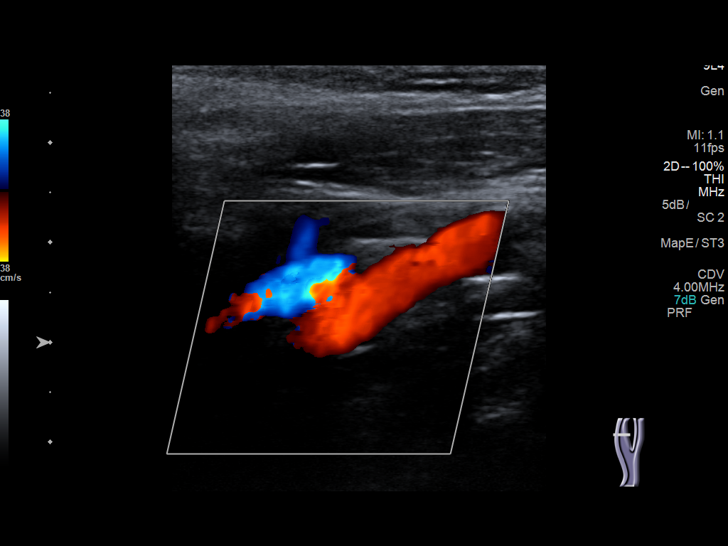
[im 46/88]
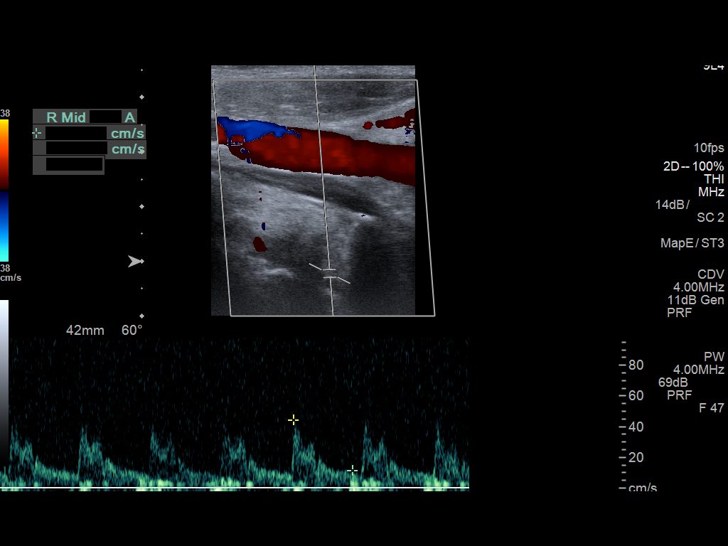
[im 50/88]
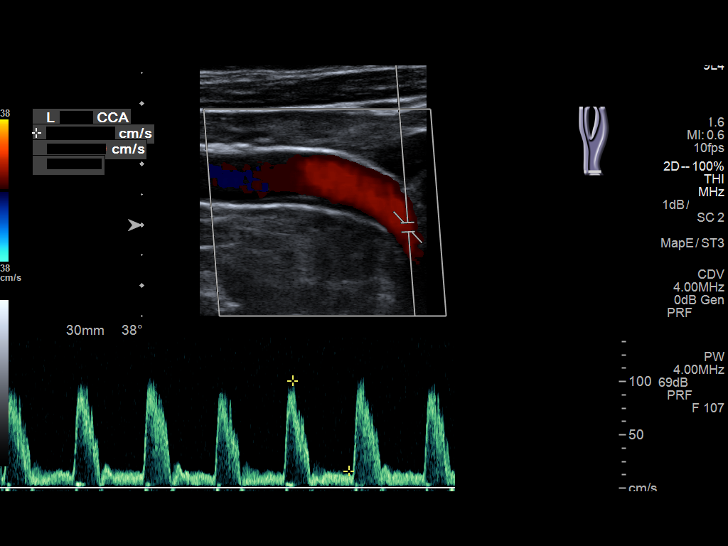
[im 57/88]
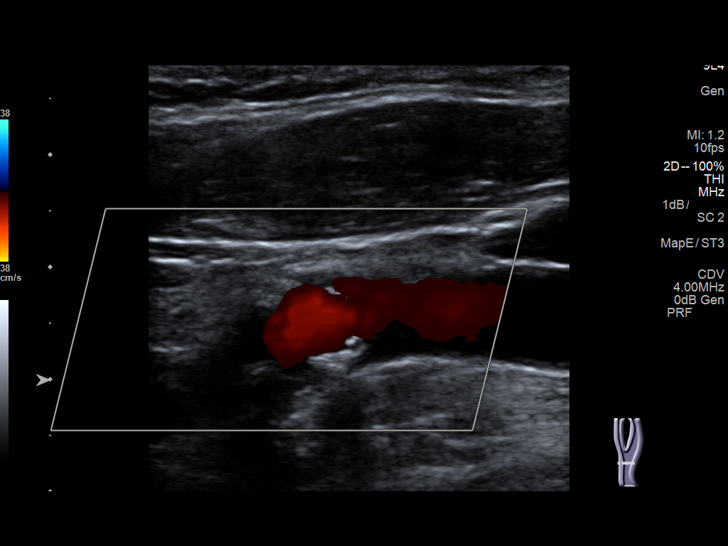
[im 65/88]
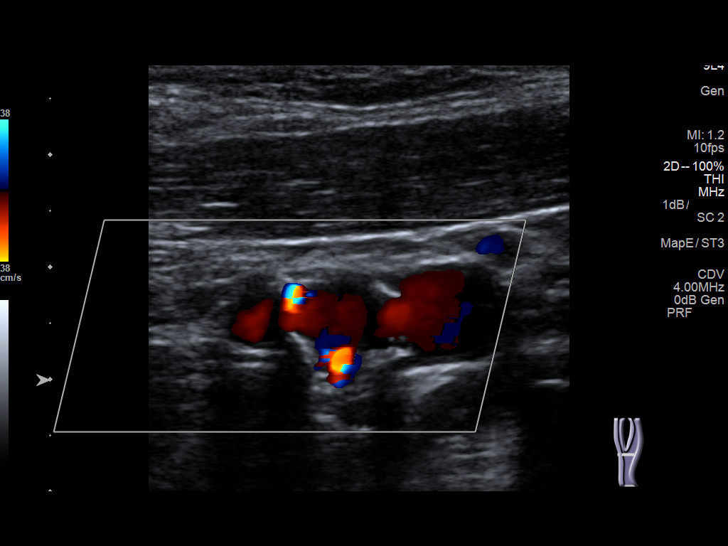
[im 72/88]
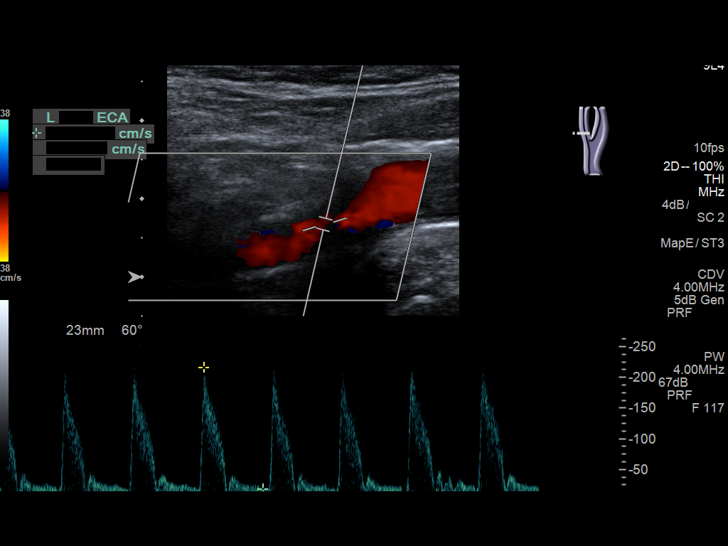
[im 80/88]
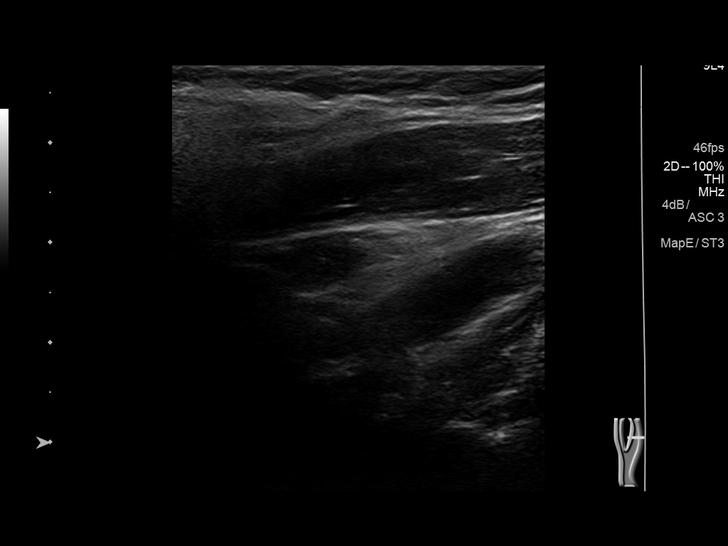
[im 88/88]
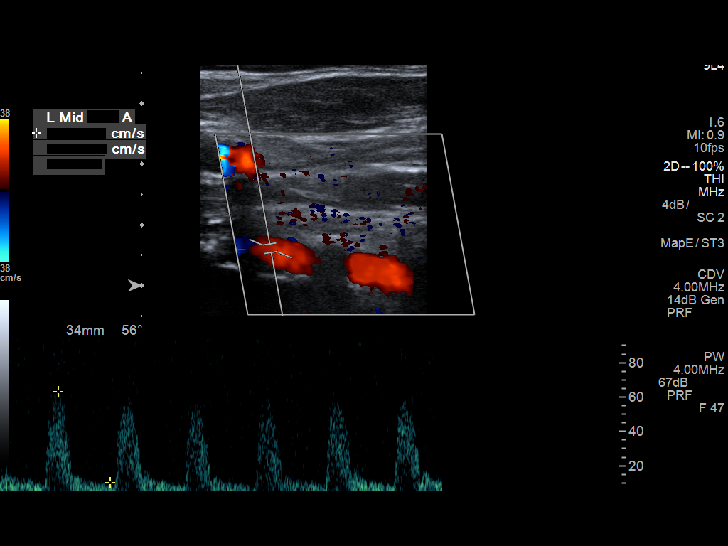

[13 of 24 positions shown; findings below may reference images not displayed]

FINDINGS: Criteria: Quantification of carotid stenosis is based on velocity
parameters that correlate the residual internal carotid diameter
with NASCET-based stenosis levels, using the diameter of the distal
internal carotid lumen as the denominator for stenosis measurement.

The following velocity measurements were obtained:

RIGHT

ICA:  175 cm/sec

CCA:  15 cm/sec

SYSTOLIC ICA/CCA RATIO:

DIASTOLIC ICA/CCA RATIO:

ECA:  127 cm/sec

LEFT

ICA:  193/34 cm/sec

CCA:  103/60 cm/sec

SYSTOLIC ICA/CCA RATIO:

DIASTOLIC ICA/CCA RATIO:

ECA:  216 cm/sec

RIGHT CAROTID ARTERY: Prominent atherosclerotic vascular plaque at
the right carotid bifurcation and proximal internal carotid artery.
Worsening flow velocity and velocity ratios. Findings consistent
with 50-69% stenosis.

RIGHT VERTEBRAL ARTERY:  Patent with antegrade flow.

LEFT CAROTID ARTERY: Prominent atherosclerotic vascular plaque left
carotid bifurcation and proximal internal carotid artery. Worsening
flow velocity velocity ratios on the left are noted. Findings
consistent with 50-69% stenosis. Ulcerated plaque left carotid bulb
may be present.

LEFT VERTEBRAL ARTERY:  Patent with antegrade flow.
IMPRESSION: 1. Prominent atherosclerotic vascular plaques are noted the right
and left carotid bifurcations and proximal internal carotid
arteries. O velocity and velocity ratios have worsened from prior
exam. Findings consistent with bilateral carotid bifurcation/
proximal ICA 50-69% stenosis. Ulceration of the left carotid bulb
plaque may be present.

2. Vertebral arteries are patent with antegrade flow.

## 2017-09-18 ENCOUNTER — Telehealth: Payer: Self-pay | Admitting: Internal Medicine

## 2017-09-18 NOTE — Telephone Encounter (Signed)
I was not the one who requested the 1st set. I will have to see if the request was scanned in. Otherwise, he will have to sign a new release form.

## 2017-09-18 NOTE — Telephone Encounter (Signed)
See below CRM   Copied from [place] #[id number]. Topic: General - Other >> Sep 18, 2017  8:54 AM Synthia Innocent wrote: Reason for CRM: Patient would like to know if medical records were received from [facility] in [facility]  >> Sep 18, 2017 10:26 AM Margot Ables wrote: Resend request needs to include name/DOB, records from 08/17/17-08/24/17 for Dr. Eduard Roux, and put on it "STAT" to rush the request, fax# [phone].

## 2017-09-19 ENCOUNTER — Telehealth: Payer: Self-pay | Admitting: Internal Medicine

## 2017-09-19 NOTE — Telephone Encounter (Signed)
Received from Joya Salm forwarded 40 pages

## 2017-09-20 NOTE — Telephone Encounter (Signed)
Per note on 09-19-17, notes were received.

## 2017-09-24 ENCOUNTER — Telehealth: Payer: Self-pay

## 2017-09-24 NOTE — Telephone Encounter (Signed)
Spoke to pt. He will let us know if it is not improving in the next 1-2- weeks.

## 2017-09-24 NOTE — Telephone Encounter (Signed)
Spoke to pt. He is still having a lingering cough. Needs to know what to do about. Does he need an OV? He took some Delsym a few nights ago and it helped some.

## 2017-09-24 NOTE — Telephone Encounter (Signed)
A little residual cough is not surprising. If he has fever or SOB---needs to be seen right away. If the cough persists over the next 1-2 weeks, I will repeat the chest x-ray then  (instead of waiting till his follow up visit)

## 2017-10-02 ENCOUNTER — Other Ambulatory Visit: Payer: Self-pay | Admitting: Internal Medicine

## 2017-10-02 NOTE — Telephone Encounter (Signed)
Last filled 09-03-17 #60 Last OV 09-07-17 Next OV 11-16-17

## 2017-10-08 ENCOUNTER — Other Ambulatory Visit: Payer: Self-pay | Admitting: Cardiovascular Disease

## 2017-10-08 DIAGNOSIS — I739 Peripheral vascular disease, unspecified: Secondary | ICD-10-CM

## 2017-10-12 ENCOUNTER — Ambulatory Visit (HOSPITAL_COMMUNITY)
Admission: RE | Admit: 2017-10-12 | Discharge: 2017-10-12 | Disposition: A | Discharge status: Home / Self Care | Payer: Medicare Other | Source: Ambulatory Visit | Attending: Cardiology | Admitting: Cardiology

## 2017-10-12 DIAGNOSIS — I739 Peripheral vascular disease, unspecified: Secondary | ICD-10-CM | POA: Insufficient documentation

## 2017-10-12 DIAGNOSIS — I700 Atherosclerosis of aorta: Secondary | ICD-10-CM | POA: Diagnosis not present

## 2017-10-12 DIAGNOSIS — Z87891 Personal history of nicotine dependence: Secondary | ICD-10-CM | POA: Diagnosis not present

## 2017-10-12 DIAGNOSIS — I2510 Atherosclerotic heart disease of native coronary artery without angina pectoris: Secondary | ICD-10-CM | POA: Diagnosis not present

## 2017-10-12 DIAGNOSIS — I10 Essential (primary) hypertension: Secondary | ICD-10-CM | POA: Diagnosis not present

## 2017-10-12 DIAGNOSIS — E785 Hyperlipidemia, unspecified: Secondary | ICD-10-CM | POA: Insufficient documentation

## 2017-10-12 DIAGNOSIS — R6889 Other general symptoms and signs: Secondary | ICD-10-CM | POA: Diagnosis not present

## 2017-10-15 ENCOUNTER — Other Ambulatory Visit: Payer: Self-pay | Admitting: *Deleted

## 2017-10-15 DIAGNOSIS — I739 Peripheral vascular disease, unspecified: Secondary | ICD-10-CM

## 2017-10-22 ENCOUNTER — Other Ambulatory Visit: Payer: Self-pay | Admitting: Cardiology

## 2017-11-02 ENCOUNTER — Other Ambulatory Visit: Payer: Self-pay | Admitting: Internal Medicine

## 2017-11-02 NOTE — Telephone Encounter (Signed)
Last filled 10-02-17 #60 Last CPE: 11-10-16 Next OV/CPE: 11-16-17  [facility] [address]

## 2017-11-16 ENCOUNTER — Ambulatory Visit (INDEPENDENT_AMBULATORY_CARE_PROVIDER_SITE_OTHER)
Admission: RE | Admit: 2017-11-16 | Discharge: 2017-11-16 | Disposition: A | Discharge status: Home / Self Care | Payer: Medicare Other | Source: Ambulatory Visit | Attending: Internal Medicine | Admitting: Internal Medicine

## 2017-11-16 ENCOUNTER — Encounter: Payer: Self-pay | Admitting: Internal Medicine

## 2017-11-16 ENCOUNTER — Ambulatory Visit (INDEPENDENT_AMBULATORY_CARE_PROVIDER_SITE_OTHER): Payer: Medicare Other | Admitting: Internal Medicine

## 2017-11-16 VITALS — BP 134/70 | HR 66 | Ht 67.5 in | Wt 204.0 lb

## 2017-11-16 DIAGNOSIS — J181 Lobar pneumonia, unspecified organism: Secondary | ICD-10-CM | POA: Diagnosis not present

## 2017-11-16 DIAGNOSIS — Z7189 Other specified counseling: Secondary | ICD-10-CM

## 2017-11-16 DIAGNOSIS — I6523 Occlusion and stenosis of bilateral carotid arteries: Secondary | ICD-10-CM | POA: Diagnosis not present

## 2017-11-16 DIAGNOSIS — Z1211 Encounter for screening for malignant neoplasm of colon: Secondary | ICD-10-CM | POA: Diagnosis not present

## 2017-11-16 DIAGNOSIS — Z0000 Encounter for general adult medical examination without abnormal findings: Secondary | ICD-10-CM | POA: Diagnosis not present

## 2017-11-16 DIAGNOSIS — M170 Bilateral primary osteoarthritis of knee: Secondary | ICD-10-CM

## 2017-11-16 DIAGNOSIS — I739 Peripheral vascular disease, unspecified: Secondary | ICD-10-CM | POA: Diagnosis not present

## 2017-11-16 LAB — CBC
HEMATOCRIT: 37 % — AB (ref 39.0–52.0)
HEMOGLOBIN: 12.5 g/dL — AB (ref 13.0–17.0)
MCHC: 33.9 g/dL (ref 30.0–36.0)
MCV: 111.1 fl — AB (ref 78.0–100.0)
PLATELETS: 293 10*3/uL (ref 150.0–400.0)
RBC: 3.33 Mil/uL — ABNORMAL LOW (ref 4.22–5.81)
RDW: 14 % (ref 11.5–15.5)
WBC: 7.8 10*3/uL (ref 4.0–10.5)

## 2017-11-16 LAB — LIPID PANEL
CHOL/HDL RATIO: 2
Cholesterol: 85 mg/dL (ref 0–200)
HDL: 52.9 mg/dL (ref >39.00)
LDL Cholesterol: 21 mg/dL (ref 0–99)
NonHDL: 32.18
Triglycerides: 54 mg/dL (ref 0.0–149.0)
VLDL: 10.8 mg/dL (ref 0.0–40.0)

## 2017-11-16 LAB — SEDIMENTATION RATE: Sed Rate: 24 mm/hr — ABNORMAL HIGH (ref 0–20)

## 2017-11-16 NOTE — Assessment & Plan Note (Signed)
See social history 

## 2017-11-16 NOTE — Progress Notes (Signed)
Hearing Screening   Method: Audiometry   125Hz  250Hz  500Hz  1000Hz  2000Hz  3000Hz  4000Hz  6000Hz  8000Hz   Right ear:   25 25 20  25     Left ear:   20 20 20  20       Visual Acuity Screening   Right eye Left eye Both eyes  Without correction:     With correction: 20/25 20/20 20/15

## 2017-11-16 NOTE — Assessment & Plan Note (Signed)
Stable moderate disease

## 2017-11-16 NOTE — Assessment & Plan Note (Addendum)
I have personally reviewed the Medicare Annual Wellness questionnaire and have noted 1. The patient's medical and social history 2. Their use of alcohol, tobacco or illicit drugs 3. Their current medications and supplements 4. The patient's functional ability including ADL's, fall risks, home safety risks and hearing or visual             impairment. 5. Diet and physical activities 6. Evidence for depression or mood disorders  The patients weight, height, BMI and visual acuity have been recorded in the chart I have made referrals, counseling and provided education to the patient based review of the above and I have provided the pt with a written personalized care plan for preventive services.  I have provided you with a copy of your personalized plan for preventive services. Please take the time to review along with your updated medication list.  Yearly flu vaccine FIT No PSA due to age Will consider shingrix in future Discussed being as active as possible

## 2017-11-16 NOTE — Assessment & Plan Note (Signed)
Clinically resolved Will recheck CXR, CBC and sed rate

## 2017-11-16 NOTE — Assessment & Plan Note (Signed)
Still limited with walking On statin, ASA

## 2017-11-16 NOTE — Assessment & Plan Note (Signed)
Does okay with the tramadol bid

## 2017-11-16 NOTE — Progress Notes (Signed)
Subjective:    Patient ID: Aaron Sexton, male    DOB: Sep 25, 1945, 72 y.o.   MRN: [id number]  HPI Here for Medicare wellness visit and follow up of chronic health conditions Reviewed form and advanced directives Reviewed other doctors No tobacco or alcohol Vision and hearing are fine No exercise---tries to walk some 1 fall ---missed step. No injury No depression or anhedonia Independent with instrumental ADLs   Still limited in walking--pain in both legs Better after rest Continues with Dr Allyson Sabal  Yearly carotid checks Still moderate but subcritical blockage No aphasia, facial droop, focal weakness, etc  Feels completely over the pneumonia Energy levels back to baseline No cough or fever  No chest pain No palpitations No SOB No dizziness or syncope Mild edema if up on feet for a while--resolves with elevation  Current Outpatient Medications on File Prior to Visit  Medication Sig Dispense Refill  . aspirin 81 MG tablet Take 81 mg by mouth daily.      . chlorthalidone (HYGROTON) 25 MG tablet Take 0.5 tablets (12.5 mg total) by mouth daily. 45 tablet 3  . cimetidine (TAGAMET) 400 MG tablet Take 400 mg by mouth 2 (two) times daily as needed (indigestion).    . clopidogrel (PLAVIX) 75 MG tablet TAKE 1 TABLET(75 MG) BY MOUTH DAILY WITH BREAKFAST 30 tablet 11  . Cyanocobalamin (VITAMIN B-12) 500 MCG SUBL Place 500 mcg under the tongue daily.    [name] losartan (COZAAR) 25 MG tablet TAKE ONE TABLET BY MOUTH EVERY DAY 90 tablet 0  . metoprolol succinate (TOPROL-XL) 100 MG 24 hr tablet TAKE 1 TABLET BY MOUTH EVERY DAY 90 tablet 3  . nitroGLYCERIN (NITROSTAT) 0.4 MG SL tablet Place 1 tablet (0.4 mg total) under the tongue every 5 (five) minutes as needed for chest pain. 25 tablet 3  . Omega-3 Fatty Acids (FISH OIL PO) Take 1,000 mg by mouth daily.     . pravastatin (PRAVACHOL) 20 MG tablet TAKE 1 TABLET(20 MG) BY MOUTH DAILY 90 tablet 3  . Probiotic Product (PROBIOTIC PO) Take by  mouth.    . traMADol (ULTRAM) 50 MG tablet TAKE 1 TABLET BY MOUTH EVERY 8 HOURS AS NEEDED FOR PAIN 60 tablet 0   No current facility-administered medications on file prior to visit.     Allergies  Allergen Reactions  . Diltiazem Hcl Hives and Rash    Past Medical History:  Diagnosis Date  . Arteriosclerotic cardiovascular disease (ASCVD) 1993   Critical RCA disease in 1993 treated with PTCA  . Cerebrovascular disease    Moderate ASVD without focal stenosis in 01/2007  . Colon polyps 3/12   single 2mm polyp--tubular adenoma  . Diverticulosis 2012   found on colonoscopy  . Erectile dysfunction   . Family history of adverse reaction to anesthesia    difficult for son & sistor to wake   . GERD (gastroesophageal reflux disease)   . Hematochezia 05/24/2010  . Hyperlipidemia   . Osteoarthritis    knees/hands-Dr Sherlean Foot  . Overweight(278.02)   . Peripheral vascular disease (HCC) 03/11/2010   Moderate SFA stenosis; history of claudication  . Rosacea   . Tobacco abuse, in remission    30-40 pack years discontinued in 1993  . Vitamin B12 deficiency     Past Surgical History:  Procedure Laterality Date  . APPENDECTOMY    . COLONOSCOPY W/ POLYPECTOMY  2012  . ILIAC VEIN ANGIOPLASTY / STENTING Right 08/09/2015  . PERIPHERAL VASCULAR CATHETERIZATION Bilateral 06/21/2015  Procedure: Lower Extremity Angiography;  Surgeon: Runell Gess, MD;  Location: [facility] INVASIVE CV LAB;  Service: Cardiovascular;  Laterality: Bilateral;  . PERIPHERAL VASCULAR CATHETERIZATION N/A 06/21/2015   Procedure: Abdominal Aortogram;  Surgeon: Runell Gess, MD;  Location: [facility] INVASIVE CV LAB;  Service: Cardiovascular;  Laterality: N/A;  . PERIPHERAL VASCULAR CATHETERIZATION N/A 07/26/2015   Procedure: Lower Extremity Angiography;  Surgeon: Runell Gess, MD;  Location: [facility] INVASIVE CV LAB;  Service: Cardiovascular;  Laterality: N/A;  . PERIPHERAL VASCULAR CATHETERIZATION N/A 08/09/2015   Procedure: Lower  Extremity Angiography;  Surgeon: Runell Gess, MD;  Location: [facility] INVASIVE CV LAB;  Service: Cardiovascular;  Laterality: N/A;  . PERIPHERAL VASCULAR CATHETERIZATION  08/09/2015   Procedure: Peripheral Vascular Intervention;  Surgeon: Runell Gess, MD;  Location: [facility] INVASIVE CV LAB;  Service: Cardiovascular;;  rt ext. iliac atherectomy and stent  . PERIPHERAL VASCULAR CATHETERIZATION N/A 09/20/2015   Procedure: Lower Extremity Angiography;  Surgeon: Runell Gess, MD;  Location: [facility] INVASIVE CV LAB;  Service: Cardiovascular;  Laterality: N/A;  . PERIPHERAL VASCULAR CATHETERIZATION Right 09/20/2015   Procedure: Peripheral Vascular Intervention;  Surgeon: Runell Gess, MD;  Location: [facility] INVASIVE CV LAB;  Service: Cardiovascular;  Laterality: Right;  SFA  . ROTATOR CUFF REPAIR Left 7/15   Dr Sherlean Foot    Family History  Problem Relation Age of Onset  . Hypertension Father        And siblings  . Heart disease Father        And second-degree relatives  . Transient ischemic attack Father   . Lung cancer Mother   . Cancer Sister        brain cancer    Social History   Socioeconomic History  . Marital status: Married    Spouse name: Not on file  . Number of children: Not on file  . Years of education: Not on file  . Highest education level: Not on file  Occupational History  . 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 Financial resource strain: Not on file  . Food insecurity:    Worry: Not on file    Inability: Not on file  . Transportation needs:    Medical: Not on file    Non-medical: Not on file  Tobacco Use  . Smoking status: Former Smoker    Packs/day: 1.00    Years: 30.00    Pack years: 30.00    Last attempt to quit: 05/16/1990    Years since quitting: 27.5  . Smokeless tobacco: Never Used  . Tobacco comment: Quit in 1993  Substance and Sexual Activity  . Alcohol use: No    Alcohol/week: 0.0 oz  .  Drug use: No  . Sexual activity: Not on file  Lifestyle  . Physical activity:    Days per week: Not on file    Minutes per session: Not on file  . Stress: Not on file  Relationships  . Social connections:    Talks on phone: Not on file    Gets together: Not on file    Attends religious service: Not on file    Active member of club or organization: Not on file    Attends meetings of clubs or organizations: Not on file    Relationship status: Not on file  . Intimate partner violence:    Fear of current or ex partner: Not on file    Emotionally abused: Not  on file    Physically abused: Not on file    Forced sexual activity: Not on file  Other Topics Concern  . Not on file  Social History Narrative   Has living will   Wife is health care POA---alternate is son Aaron Sexton   Would accept resuscitation but no prolonged artificial ventilation.   No tube feeds if cognitively unaware   Review of Systems Appetite is good Weight is down from baseline--being careful Sleeps well Wears seat belt No teeth problems--regular exams No skin lesions---does not see derm Bowels are okay Voids with good stream. Empties fine Rare heartburn---tagamet prn. No dysphagia Does have knee pain--- tramadol bid helps    Objective:   Physical Exam  Constitutional: He is oriented to person, place, and time. He appears well-developed. No distress.  Neck: No thyromegaly present.  Cardiovascular: Normal rate, regular rhythm and normal heart sounds. Exam reveals no gallop.  No murmur heard. Feet warm Very faint pedal pulses  Respiratory: Effort normal and breath sounds normal. No respiratory distress. He has no wheezes. He has no rales.  GI: Soft. There is no tenderness.  Musculoskeletal: He exhibits no edema or tenderness.  Lymphadenopathy:    He has no cervical adenopathy.  Neurological: He is alert and oriented to person, place, and time.  President--- "Garnet Koyanagi, Obama, Bush" 100- "I don't do  numbers" [id number]---mixed it up some Recall 3/3  Skin: No rash noted. No erythema.  Psychiatric: He has a normal mood and affect. His behavior is normal.           Assessment & Plan:

## 2017-11-22 ENCOUNTER — Other Ambulatory Visit (INDEPENDENT_AMBULATORY_CARE_PROVIDER_SITE_OTHER): Payer: Medicare Other

## 2017-11-22 DIAGNOSIS — Z1211 Encounter for screening for malignant neoplasm of colon: Secondary | ICD-10-CM | POA: Diagnosis not present

## 2017-11-22 LAB — FECAL OCCULT BLOOD, IMMUNOCHEMICAL: Fecal Occult Bld: NEGATIVE

## 2017-12-04 ENCOUNTER — Other Ambulatory Visit: Payer: Self-pay | Admitting: Cardiology

## 2017-12-10 ENCOUNTER — Other Ambulatory Visit: Payer: Self-pay | Admitting: Internal Medicine

## 2017-12-10 NOTE — Telephone Encounter (Signed)
Last filled 11-02-17 #60 Last OV 11-16-17  Next OV 11-22-18

## 2018-01-02 ENCOUNTER — Other Ambulatory Visit: Payer: Self-pay | Admitting: Internal Medicine

## 2018-01-02 NOTE — Telephone Encounter (Signed)
Last filled 12-10-17 #60 Last OV 11-16-17 Next OV 11-22-18

## 2018-01-18 ENCOUNTER — Encounter: Payer: Self-pay | Admitting: Internal Medicine

## 2018-01-18 ENCOUNTER — Ambulatory Visit: Payer: Medicare Other | Admitting: Internal Medicine

## 2018-01-18 ENCOUNTER — Ambulatory Visit (INDEPENDENT_AMBULATORY_CARE_PROVIDER_SITE_OTHER)
Admission: RE | Admit: 2018-01-18 | Discharge: 2018-01-18 | Disposition: A | Discharge status: Home / Self Care | Payer: Medicare Other | Source: Ambulatory Visit | Attending: Internal Medicine | Admitting: Internal Medicine

## 2018-01-18 VITALS — BP 132/74 | HR 75 | Temp 98.0°F | Ht 67.5 in | Wt 207.0 lb

## 2018-01-18 DIAGNOSIS — M25531 Pain in right wrist: Secondary | ICD-10-CM | POA: Diagnosis not present

## 2018-01-18 NOTE — Assessment & Plan Note (Addendum)
Likely just contusion but does have significant limitation of use Will check x-ray  X-ray negative Discussed supportive Rx---brace till pain better, ice intemittently and eventually heat

## 2018-01-18 NOTE — Progress Notes (Signed)
Subjective:    Patient ID: Aaron Sexton, male    DOB: May 28, 1945, 72 y.o.   MRN: [id number]  HPI Here due to wrist pain on right  The ice got stuck in the tray Banged it with the base of his right hand Didn't hurt at all then Noticed pain a couple of days later--some aching Tried warm and cold water---feels better. Ice pack also  Daughter gave him elastic brace--may help some  No medications for this Limited in hand movement  Current Outpatient Medications on File Prior to Visit  Medication Sig Dispense Refill  . aspirin 81 MG tablet Take 81 mg by mouth daily.      . chlorthalidone (HYGROTON) 25 MG tablet Take 0.5 tablets (12.5 mg total) by mouth daily. 45 tablet 3  . cimetidine (TAGAMET) 400 MG tablet Take 400 mg by mouth 2 (two) times daily as needed (indigestion).    . clopidogrel (PLAVIX) 75 MG tablet TAKE 1 TABLET(75 MG) BY MOUTH DAILY WITH BREAKFAST 30 tablet 11  . Cyanocobalamin (VITAMIN B-12) 500 MCG SUBL Place 500 mcg under the tongue daily.    [name] losartan (COZAAR) 25 MG tablet TAKE ONE TABLET BY MOUTH EVERY DAY 90 tablet 0  . metoprolol succinate (TOPROL-XL) 100 MG 24 hr tablet TAKE 1 TABLET BY MOUTH EVERY DAY 90 tablet 3  . nitroGLYCERIN (NITROSTAT) 0.4 MG SL tablet Place 1 tablet (0.4 mg total) under the tongue every 5 (five) minutes as needed for chest pain. 25 tablet 3  . Omega-3 Fatty Acids (FISH OIL PO) Take 1,000 mg by mouth daily.     . pravastatin (PRAVACHOL) 20 MG tablet TAKE 1 TABLET(20 MG) BY MOUTH DAILY 90 tablet 3  . Probiotic Product (PROBIOTIC PO) Take by mouth.    . traMADol (ULTRAM) 50 MG tablet TAKE 1 TABLET BY MOUTH EVERY 8 HOURS AS NEEDED FOR PAIN 60 tablet 0   No current facility-administered medications on file prior to visit.     Allergies  Allergen Reactions  . Diltiazem Hcl Hives and Rash    Past Medical History:  Diagnosis Date  . Arteriosclerotic cardiovascular disease (ASCVD) 1993   Critical RCA disease in 1993 treated with PTCA    . Cerebrovascular disease    Moderate ASVD without focal stenosis in 01/2007  . Colon polyps 3/12   single 2mm polyp--tubular adenoma  . Diverticulosis 2012   found on colonoscopy  . Erectile dysfunction   . Family history of adverse reaction to anesthesia    difficult for son & sistor to wake   . GERD (gastroesophageal reflux disease)   . Hematochezia 05/24/2010  . Hyperlipidemia   . Osteoarthritis    knees/hands-Dr Sherlean Foot  . Overweight(278.02)   . Peripheral vascular disease (HCC) 03/11/2010   Moderate SFA stenosis; history of claudication  . Rosacea   . Tobacco abuse, in remission    30-40 pack years discontinued in 1993  . Vitamin B12 deficiency     Past Surgical History:  Procedure Laterality Date  . APPENDECTOMY    . COLONOSCOPY W/ POLYPECTOMY  2012  . ILIAC VEIN ANGIOPLASTY / STENTING Right 08/09/2015  . PERIPHERAL VASCULAR CATHETERIZATION Bilateral 06/21/2015   Procedure: Lower Extremity Angiography;  Surgeon: Runell Gess, MD;  Location: [facility] INVASIVE CV LAB;  Service: Cardiovascular;  Laterality: Bilateral;  . PERIPHERAL VASCULAR CATHETERIZATION N/A 06/21/2015   Procedure: Abdominal Aortogram;  Surgeon: Runell Gess, MD;  Location: [facility] INVASIVE CV LAB;  Service: Cardiovascular;  Laterality: N/A;  .  PERIPHERAL VASCULAR CATHETERIZATION N/A 07/26/2015   Procedure: Lower Extremity Angiography;  Surgeon: Runell Gess, MD;  Location: [facility] INVASIVE CV LAB;  Service: Cardiovascular;  Laterality: N/A;  . PERIPHERAL VASCULAR CATHETERIZATION N/A 08/09/2015   Procedure: Lower Extremity Angiography;  Surgeon: Runell Gess, MD;  Location: [facility] INVASIVE CV LAB;  Service: Cardiovascular;  Laterality: N/A;  . PERIPHERAL VASCULAR CATHETERIZATION  08/09/2015   Procedure: Peripheral Vascular Intervention;  Surgeon: Runell Gess, MD;  Location: [facility] INVASIVE CV LAB;  Service: Cardiovascular;;  rt ext. iliac atherectomy and stent  . PERIPHERAL VASCULAR CATHETERIZATION N/A 09/20/2015    Procedure: Lower Extremity Angiography;  Surgeon: Runell Gess, MD;  Location: [facility] INVASIVE CV LAB;  Service: Cardiovascular;  Laterality: N/A;  . PERIPHERAL VASCULAR CATHETERIZATION Right 09/20/2015   Procedure: Peripheral Vascular Intervention;  Surgeon: Runell Gess, MD;  Location: [facility] INVASIVE CV LAB;  Service: Cardiovascular;  Laterality: Right;  SFA  . ROTATOR CUFF REPAIR Left 7/15   Dr Sherlean Foot    Family History  Problem Relation Age of Onset  . Hypertension Father        And siblings  . Heart disease Father        And second-degree relatives  . Transient ischemic attack Father   . Lung cancer Mother   . Cancer Sister        brain cancer    Social History   Socioeconomic History  . Marital status: Married    Spouse name: Not on file  . Number of children: Not on file  . Years of education: Not on file  . Highest education level: Not on file  Occupational History  . 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 Financial resource strain: Not on file  . Food insecurity:    Worry: Not on file    Inability: Not on file  . Transportation needs:    Medical: Not on file    Non-medical: Not on file  Tobacco Use  . Smoking status: Former Smoker    Packs/day: 1.00    Years: 30.00    Pack years: 30.00    Last attempt to quit: 05/16/1990    Years since quitting: 27.6  . Smokeless tobacco: Never Used  . Tobacco comment: Quit in 1993  Substance and Sexual Activity  . Alcohol use: No    Alcohol/week: 0.0 standard drinks  . Drug use: No  . Sexual activity: Not on file  Lifestyle  . Physical activity:    Days per week: Not on file    Minutes per session: Not on file  . Stress: Not on file  Relationships  . Social connections:    Talks on phone: Not on file    Gets together: Not on file    Attends religious service: Not on file    Active member of club or organization: Not on file    Attends meetings of  clubs or organizations: Not on file    Relationship status: Not on file  . Intimate partner violence:    Fear of current or ex partner: Not on file    Emotionally abused: Not on file    Physically abused: Not on file    Forced sexual activity: Not on file  Other Topics Concern  . Not on file  Social History Narrative   Has living will   Wife is health care POA---alternate is son Algernon Huxley   Would accept resuscitation but  no prolonged artificial ventilation.   No tube feeds if cognitively unaware   Review of Systems  No fever Some tingling and numbness at the tips of his fingers     Objective:   Physical Exam  Constitutional: No distress.  Musculoskeletal:  Right elbow normal No tenderness over fingers or metacarpals Moderate mid right wrist tenderness           Assessment & Plan:

## 2018-01-23 ENCOUNTER — Ambulatory Visit: Payer: Medicare Other | Admitting: Internal Medicine

## 2018-01-29 ENCOUNTER — Other Ambulatory Visit: Payer: Self-pay | Admitting: *Deleted

## 2018-01-29 DIAGNOSIS — I739 Peripheral vascular disease, unspecified: Secondary | ICD-10-CM

## 2018-01-30 ENCOUNTER — Other Ambulatory Visit: Payer: Self-pay | Admitting: Internal Medicine

## 2018-01-30 NOTE — Telephone Encounter (Signed)
Last filled 01-03-18 #60 Last OV Acute 01-18-18 Next OV 11-22-18

## 2018-02-28 ENCOUNTER — Ambulatory Visit: Payer: Medicare Other

## 2018-03-05 ENCOUNTER — Other Ambulatory Visit: Payer: Self-pay | Admitting: Internal Medicine

## 2018-03-05 NOTE — Telephone Encounter (Signed)
Last filled 01-30-18 #60 Last OV 11-16-17  Next OV 11-22-18

## 2018-04-01 ENCOUNTER — Other Ambulatory Visit: Payer: Self-pay | Admitting: Internal Medicine

## 2018-04-01 NOTE — Telephone Encounter (Signed)
Last filled 03-05-18 #60 Last OV 11-16-17 Next OV 11-22-18 [facility] [address] [facility] [employer]

## 2018-04-18 ENCOUNTER — Encounter: Payer: Self-pay | Admitting: Internal Medicine

## 2018-04-18 ENCOUNTER — Telehealth: Payer: Self-pay | Admitting: Internal Medicine

## 2018-04-18 DIAGNOSIS — Z0279 Encounter for issue of other medical certificate: Secondary | ICD-10-CM

## 2018-04-18 NOTE — Telephone Encounter (Signed)
Letter done $20 charge 

## 2018-04-18 NOTE — Telephone Encounter (Signed)
Placed in Dr Alla German Inbox

## 2018-04-18 NOTE — Telephone Encounter (Signed)
Pt dropped off form requesting a letter to be excused from jury duty. Pt is requesting it to be mailed to him. Placed request in rx tower.

## 2018-04-19 NOTE — Telephone Encounter (Signed)
Patient notified letter is complete and $20 charge.  Letter mailed to patient.

## 2018-05-02 ENCOUNTER — Other Ambulatory Visit: Payer: Self-pay | Admitting: Internal Medicine

## 2018-05-02 NOTE — Telephone Encounter (Signed)
Last Filled 04-01-18 #60 Last OV 11-16-17 Next OV 11-22-18 [facility] [address]

## 2018-05-14 ENCOUNTER — Ambulatory Visit: Payer: Medicare Other | Admitting: Cardiology

## 2018-05-14 ENCOUNTER — Encounter: Payer: Self-pay | Admitting: Cardiology

## 2018-05-14 VITALS — BP 129/66 | HR 73 | Ht 67.5 in | Wt 212.0 lb

## 2018-05-14 DIAGNOSIS — I739 Peripheral vascular disease, unspecified: Secondary | ICD-10-CM

## 2018-05-14 DIAGNOSIS — I2510 Atherosclerotic heart disease of native coronary artery without angina pectoris: Secondary | ICD-10-CM | POA: Diagnosis not present

## 2018-05-14 DIAGNOSIS — I10 Essential (primary) hypertension: Secondary | ICD-10-CM

## 2018-05-14 DIAGNOSIS — I6523 Occlusion and stenosis of bilateral carotid arteries: Secondary | ICD-10-CM

## 2018-05-14 DIAGNOSIS — E782 Mixed hyperlipidemia: Secondary | ICD-10-CM

## 2018-05-14 NOTE — Patient Instructions (Addendum)
Medication Instructions:  Your physician recommends that you continue on your current medications as directed. Please refer to the Current Medication list given to you today.   Labwork: NONE  Testing/Procedures: Your physician has requested that you have a carotid duplex. This test is an ultrasound of the carotid arteries in your neck. It looks at blood flow through these arteries that supply the brain with blood. Allow one hour for this exam. There are no restrictions or special instructions.    Follow-Up: Your physician wants you to follow-up in: 1 YEAR.  You will receive a reminder letter in the mail two months in advance. If you don't receive a letter, please call our office to schedule the follow-up appointment.   Any Other Special Instructions Will Be Listed Below (If Applicable).     If you need a refill on your cardiac medications before your next appointment, please call your pharmacy.   

## 2018-05-14 NOTE — Progress Notes (Signed)
Clinical Summary Mr. Ressler is a 73 y.o.male seen today for follow up of the following medical problems.  1. CAD  - prior PTCA to RCA in 1993   - no recent chest pain. No SOB/DOE - compliant with meds  2. Carotid stenosis  - Jan 2019 [place] 50-69% bilateal disease - denies any symptoms  3. Hyperlipidemia  11/2017 TC 85 TG 54 HDL 52 LDL 21  4. Claudication/PAD  bilateral cramping pain in calves with walking at < 1 block. No pain at rest. No sores on feet. Not improved with pletal after increasing to 100mg  bid, he asked to stop this medication and we did - ABIs L 0.59 and R 0.68. Waveforms suggestive of inflow disease. .  - followed by Dr Gwenlyn Found.  - 09/20/15 failed attempt at percutanous revasc of right SFA CTO, recs for continued medical therapy.  - 08/09/15 stenting of right EIA - from notes prior failed attempt at left SFA stenting as well  - chronic stable leg pains. Bilateral calves.    5. HTN   - home bp's 110s-120s/70s   Past Medical History:  Diagnosis Date  . Arteriosclerotic cardiovascular disease (ASCVD) 1993   Critical RCA disease in 1993 treated with PTCA  . Cerebrovascular disease    Moderate ASVD without focal stenosis in 01/2007  . Colon polyps 3/12   single 57mm polyp--tubular adenoma  . Diverticulosis 2012   found on colonoscopy  . Erectile dysfunction   . Family history of adverse reaction to anesthesia    difficult for son & sistor to wake   . GERD (gastroesophageal reflux disease)   . Hematochezia 05/24/2010  . Hyperlipidemia   . Osteoarthritis    knees/hands-Dr Ronnie Derby  . Overweight(278.02)   . Peripheral vascular disease ([place]) 03/11/2010   Moderate SFA stenosis; history of claudication  . Rosacea   . Tobacco abuse, in remission    30-40 pack years discontinued in 1993  . Vitamin B12 deficiency      Allergies  Allergen Reactions  . Diltiazem Hcl Hives and Rash     Current Outpatient Medications  Medication Sig Dispense  Refill  . aspirin 81 MG tablet Take 81 mg by mouth daily.      . chlorthalidone (HYGROTON) 25 MG tablet Take 0.5 tablets (12.5 mg total) by mouth daily. 45 tablet 3  . cimetidine (TAGAMET) 400 MG tablet Take 400 mg by mouth 2 (two) times daily as needed (indigestion).    . clopidogrel (PLAVIX) 75 MG tablet TAKE 1 TABLET(75 MG) BY MOUTH DAILY WITH BREAKFAST 30 tablet 11  . Cyanocobalamin (VITAMIN B-12) 500 MCG SUBL Place 500 mcg under the tongue daily.    [name] losartan (COZAAR) 25 MG tablet TAKE ONE TABLET BY MOUTH EVERY DAY 90 tablet 3  . metoprolol succinate (TOPROL-XL) 100 MG 24 hr tablet TAKE 1 TABLET BY MOUTH EVERY DAY 90 tablet 3  . nitroGLYCERIN (NITROSTAT) 0.4 MG SL tablet Place 1 tablet (0.4 mg total) under the tongue every 5 (five) minutes as needed for chest pain. 25 tablet 3  . Omega-3 Fatty Acids (FISH OIL PO) Take 1,000 mg by mouth daily.     . pravastatin (PRAVACHOL) 20 MG tablet TAKE 1 TABLET(20 MG) BY MOUTH DAILY 90 tablet 3  . Probiotic Product (PROBIOTIC PO) Take by mouth.    . traMADol (ULTRAM) 50 MG tablet TAKE 1 TABLET BY MOUTH EVERY 8 HOURS AS NEEDED FOR PAIN 60 tablet 0   No current facility-administered medications for  this visit.      Past Surgical History:  Procedure Laterality Date  . APPENDECTOMY    . COLONOSCOPY W/ POLYPECTOMY  2012  . ILIAC VEIN ANGIOPLASTY / STENTING Right 08/09/2015  . PERIPHERAL VASCULAR CATHETERIZATION Bilateral 06/21/2015   Procedure: Lower Extremity Angiography;  Surgeon: Lorretta Harp, MD;  Location: [place] CV LAB;  Service: Cardiovascular;  Laterality: Bilateral;  . PERIPHERAL VASCULAR CATHETERIZATION N/A 06/21/2015   Procedure: Abdominal Aortogram;  Surgeon: Lorretta Harp, MD;  Location: [place] CV LAB;  Service: Cardiovascular;  Laterality: N/A;  . PERIPHERAL VASCULAR CATHETERIZATION N/A 07/26/2015   Procedure: Lower Extremity Angiography;  Surgeon: Lorretta Harp, MD;  Location: [place] CV LAB;  Service:  Cardiovascular;  Laterality: N/A;  . PERIPHERAL VASCULAR CATHETERIZATION N/A 08/09/2015   Procedure: Lower Extremity Angiography;  Surgeon: Lorretta Harp, MD;  Location: [place] CV LAB;  Service: Cardiovascular;  Laterality: N/A;  . PERIPHERAL VASCULAR CATHETERIZATION  08/09/2015   Procedure: Peripheral Vascular Intervention;  Surgeon: Lorretta Harp, MD;  Location: [place] CV LAB;  Service: Cardiovascular;;  rt ext. iliac atherectomy and stent  . PERIPHERAL VASCULAR CATHETERIZATION N/A 09/20/2015   Procedure: Lower Extremity Angiography;  Surgeon: Lorretta Harp, MD;  Location: [place] CV LAB;  Service: Cardiovascular;  Laterality: N/A;  . PERIPHERAL VASCULAR CATHETERIZATION Right 09/20/2015   Procedure: Peripheral Vascular Intervention;  Surgeon: Lorretta Harp, MD;  Location: [place] CV LAB;  Service: Cardiovascular;  Laterality: Right;  SFA  . ROTATOR CUFF REPAIR Left 7/15   Dr Ronnie Derby     Allergies  Allergen Reactions  . Diltiazem Hcl Hives and Rash      Family History  Problem Relation Age of Onset  . Hypertension Father        And siblings  . Heart disease Father        And second-degree relatives  . Transient ischemic attack Father   . Lung cancer Mother   . Cancer Sister        brain cancer     Social History Mr. Cowley reports that he quit smoking about 28 years ago. He has a 30.00 pack-year smoking history. He has never used smokeless tobacco. Mr. Brosseau reports no history of alcohol use.   Review of Systems CONSTITUTIONAL: No weight loss, fever, chills, weakness or fatigue.  HEENT: Eyes: No visual loss, blurred vision, double vision or yellow sclerae.No hearing loss, sneezing, congestion, runny nose or sore throat.  SKIN: No rash or itching.  CARDIOVASCULAR: per hpi RESPIRATORY: No shortness of breath, cough or sputum.  GASTROINTESTINAL: No anorexia, nausea, vomiting or diarrhea. No abdominal pain or blood.  GENITOURINARY: No burning on  urination, no polyuria NEUROLOGICAL: No headache, dizziness, syncope, paralysis, ataxia, numbness or tingling in the extremities. No change in bowel or bladder control.  MUSCULOSKELETAL: No muscle, back pain, joint pain or stiffness.  LYMPHATICS: No enlarged nodes. No history of splenectomy.  PSYCHIATRIC: No history of depression or anxiety.  ENDOCRINOLOGIC: No reports of sweating, cold or heat intolerance. No polyuria or polydipsia.  [name]   Physical Examination Vitals:   05/14/18 1453  BP: 129/66  Pulse: 73  SpO2: 95%   Vitals:   05/14/18 1453  Weight: 212 lb (96.2 kg)  Height: 5' 7.5" (1.715 m)    Gen: resting comfortably, no acute distress HEENT: no scleral icterus, pupils equal round and reactive, no palptable cervical adenopathy,  CV: RRR, no m/r/g, no jvd Resp: Clear to auscultation bilaterally GI:  abdomen is soft, non-tender, non-distended, normal bowel sounds, no hepatosplenomegaly MSK: extremities are warm, no edema.  Skin: warm, no rash Neuro:  no focal deficits Psych: appropriate affect   Diagnostic Studies  08/2011 Carotid US:  IMPRESSION: No significant change in the bilateral carotid atherosclerosis.  Right ICA narrowing less than 50%.  Moderate left ICA narrowing, 50-69%. Pertinent labs  08/2012: TC 84 TG 71 HDL 40 LDL 29 Na 135 BUN 25 Cr 1.1 Hgb 12.9 Hct 39.3 Plt 280 AST 23 ALT 31 TSH 0.51   03/14/13 Clinic EKG: sinus rhythm, normal axis, no ischemic changes   03/2009 ABI IMPRESSION: Similar pattern of arterial occlusive disease in both lower extremities. Ankle-brachial indices are moderately depressed at rest with segmental evaluation consistent with bilateral SFA occlusive disease and potential component of proximal inflow disease. Further anatomic delineation would be helpful with a study such as CT angiography to determine exact nature of occlusive disease.  08/2013 ABI FINDINGS:  Right ABI: 0.68  Left ABI: 0.59  Right Lower  Extremity: Significantly decreased pressure between the  right brachial pressure and right upper thigh pressure suggests  significant inflow disease. No focal pressure drop between the upper  and lower thigh, or below the knee. Moderately abnormal PVRs from  the thigh to the metatarsals consistent with inflow disease.  Left Lower Extremity: Significantly decreased pressure between the  left brachial pressure and left upper thigh consistent with inflow  disease. No significant pressure drop between the upper thigh and  lower thigh or below the knee. Moderately abnormal PVRs throughout  the left lower extremity consistent with inflow disease.  IMPRESSION:  1. Abnormal bilateral ankle-brachial indices consistent with  moderate peripheral arterial disease which is slightly improved  compared to 03/15/2009. Query interval history of SFA intervention,  or successful exercise program?  2. Based on today's evaluation, the level of disease appears to be  inflow (aortoiliac) bilaterally.  Signed,  Criselda Peaches, MD  [facility]  [facility]   08/2013 Carotid US IMPRESSION:  1. Interval progression of mild right ICA stenosis from less than  50% to an estimated 50- 69%.  2. No significant interval change in moderate (50-69%) left ICA  narrowing.  3. Vertebral arteries are patent with normal antegrade flow.  Signed,   03/2015 Carotid US Right:  Heterogeneous plaque at the carotid bifurcation, with discordant results regarding degree of stenosis. Peak velocity suggests 50%- 69% stenosis, with the ICA/ CCA ratio suggesting a lesser degree of stenosis. If establishing a more accurate degree of stenosis is desired, cerebral angiogram should be considered, or as a second best test, CTA.  Left:  Heterogeneous plaque at the carotid bifurcation, with discordant results regarding degree of stenosis. Peak  velocity suggests 50% -69% stenosis, with the ICA/ CCA ratio suggesting a lesser degree of stenosis. If establishing a more accurate degree of stenosis is desired, cerebral angiogram should be considered, or as a second best test, CTA.  Jan 2019 carotid US IMPRESSION: 50-69% stenosis in the right and left internal carotid arteries. No significant change.    Assessment and Plan  1. CAD  - no symptoms, continue current meds  2. Carotid stenosis  - asymptomatic. Will repeat carotid US - if carotids stable then go 2 year intervals  3. Hyperlipidemia  -at goal, continue current meds  4. Claudication/PAD - continue to follow with Dr Gwenlyn Found  5. HTN  -at goal, continue current meds   F/u 1 year  Arnoldo Lenis, M.D

## 2018-05-15 ENCOUNTER — Encounter: Payer: Self-pay | Admitting: Cardiology

## 2018-05-20 ENCOUNTER — Ambulatory Visit (HOSPITAL_COMMUNITY)
Admission: RE | Admit: 2018-05-20 | Discharge: 2018-05-20 | Disposition: A | Discharge status: Home / Self Care | Payer: Medicare Other | Source: Ambulatory Visit | Attending: Cardiology | Admitting: Cardiology

## 2018-05-20 DIAGNOSIS — I6523 Occlusion and stenosis of bilateral carotid arteries: Secondary | ICD-10-CM | POA: Insufficient documentation

## 2018-06-04 ENCOUNTER — Other Ambulatory Visit: Payer: Self-pay | Admitting: Internal Medicine

## 2018-06-04 NOTE — Telephone Encounter (Signed)
Last filled 05-02-18 #60 Last OV 05-14-18 Next OV 11-22-18 [facility] [address] [employer] [address]

## 2018-07-03 ENCOUNTER — Other Ambulatory Visit: Payer: Self-pay | Admitting: Cardiovascular Disease

## 2018-07-03 ENCOUNTER — Other Ambulatory Visit: Payer: Self-pay | Admitting: Internal Medicine

## 2018-07-03 NOTE — Telephone Encounter (Signed)
Last filled 06-04-18 #60 Last OV 11-16-17 Next OV 11-22-18 [facility] [address]

## 2018-08-07 ENCOUNTER — Other Ambulatory Visit: Payer: Self-pay | Admitting: Internal Medicine

## 2018-08-07 ENCOUNTER — Other Ambulatory Visit: Payer: Self-pay | Admitting: Cardiology

## 2018-08-07 NOTE — Telephone Encounter (Signed)
Last filled 07-03-18 #60 Last OV 11-16-17 Next OV 11-22-18 [facility] [address] [employer] [address]

## 2018-09-06 IMAGING — US US CAROTID DUPLEX BILAT
1 series · 13 of 24 positions shown · non-contrast
Comparison: 05/23/2016

CLINICAL DATA: Bilateral carotid artery stenosis

EXAM:
BILATERAL CAROTID DUPLEX ULTRASOUND
TECHNIQUE: Gray scale imaging, color Doppler and duplex ultrasound were
performed of bilateral carotid and vertebral arteries in the neck.

[Series 1: us carotid duplex bilat · 0.06mm/px · 13 of 75 slices shown]
[im 1/75]
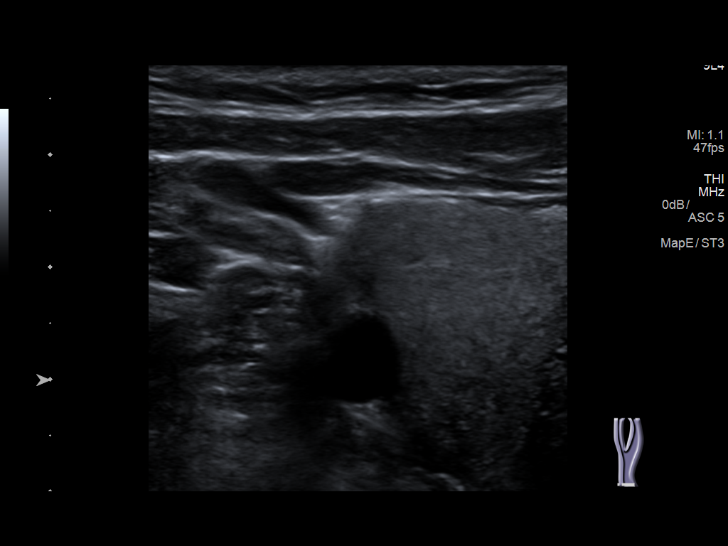
[im 7/75]
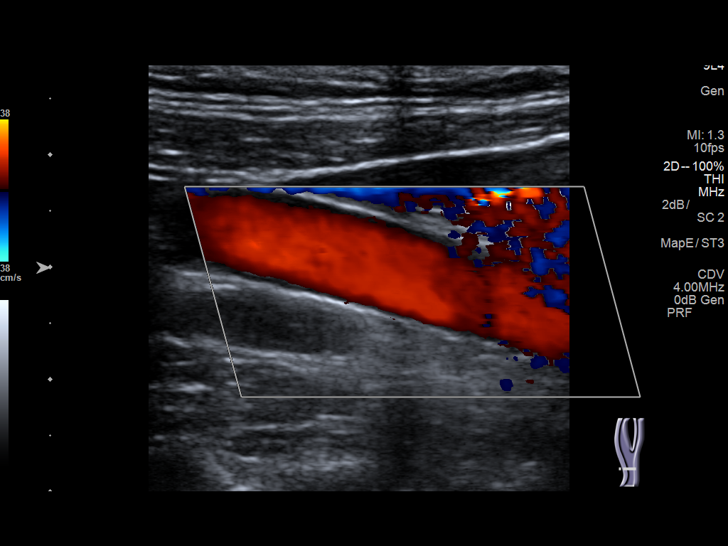
[im 13/75]
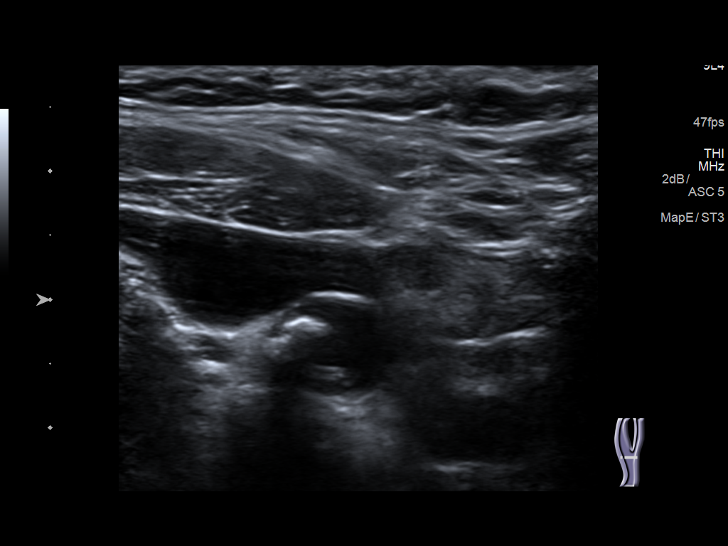
[im 20/75]
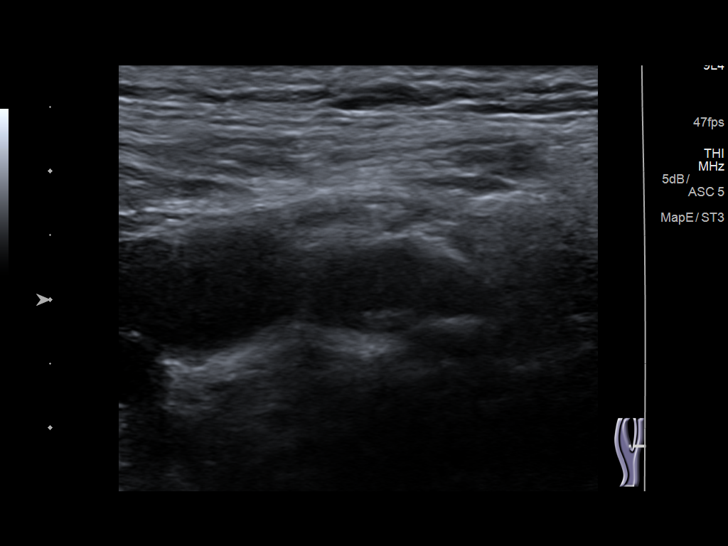
[im 26/75]
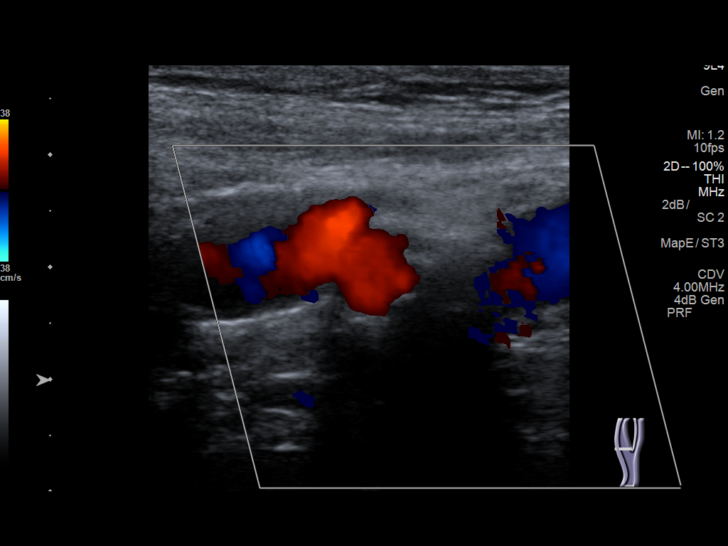
[im 33/75]
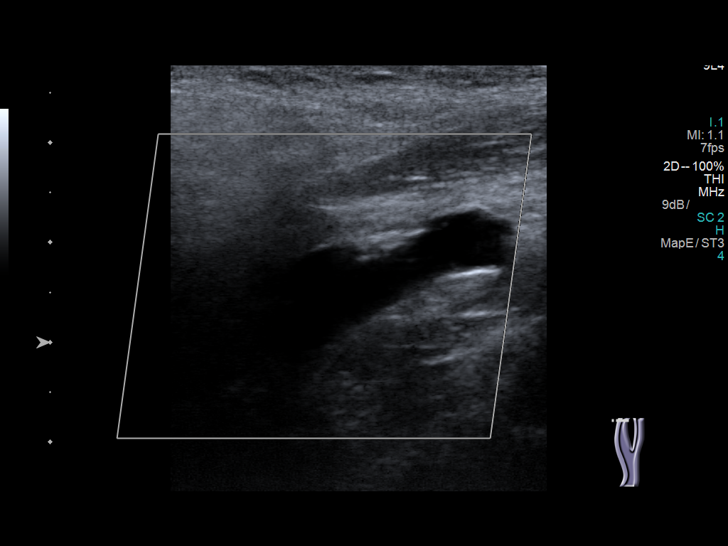
[im 39/75]
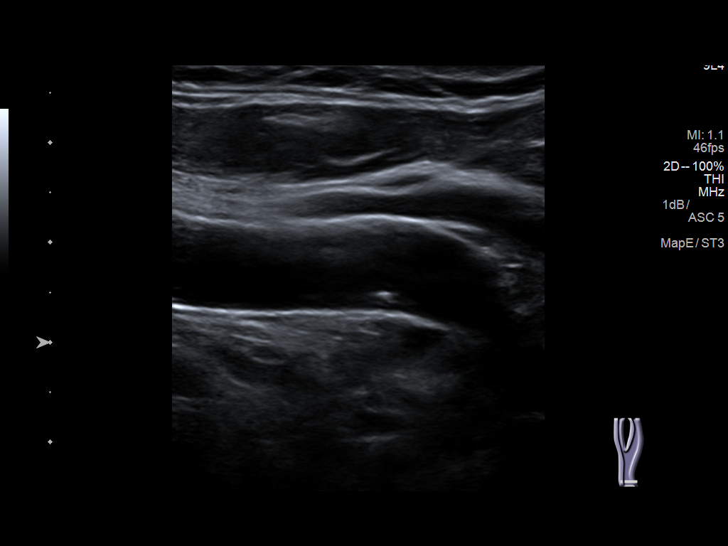
[im 42/75]
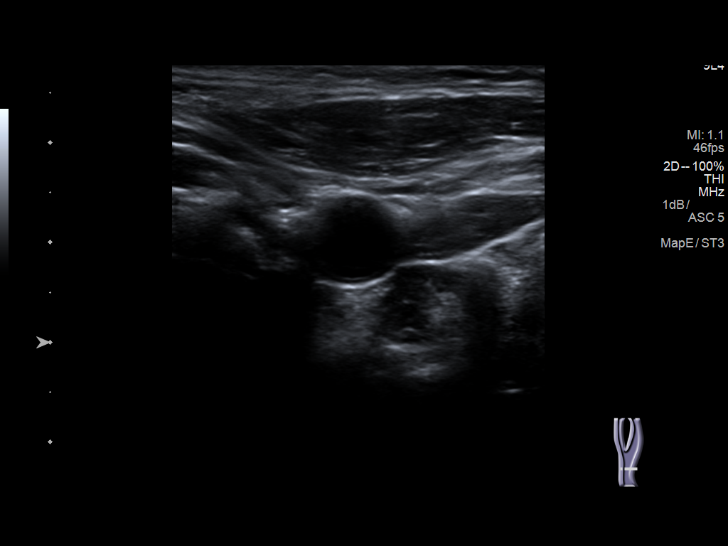
[im 49/75]
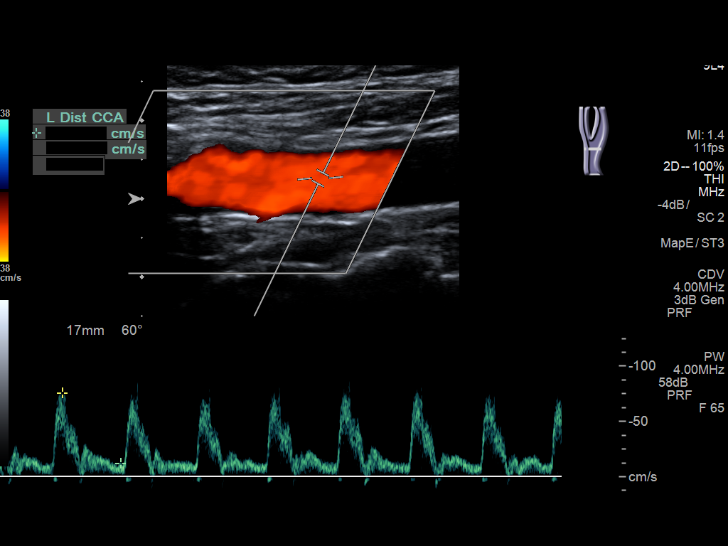
[im 55/75]
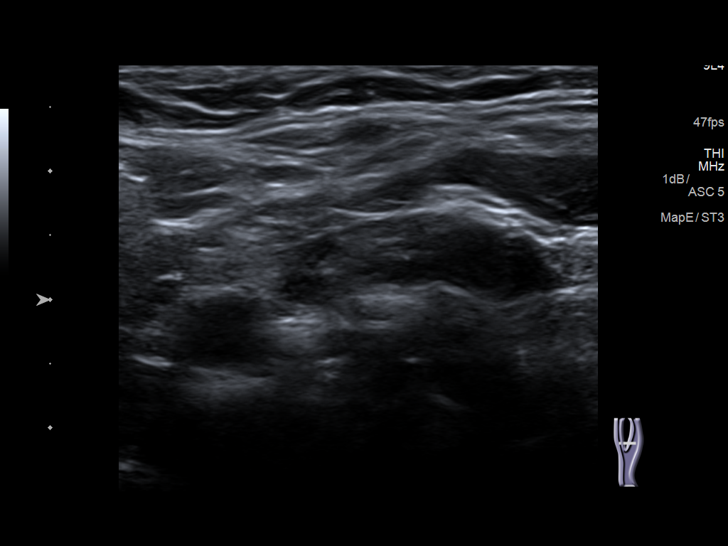
[im 62/75]
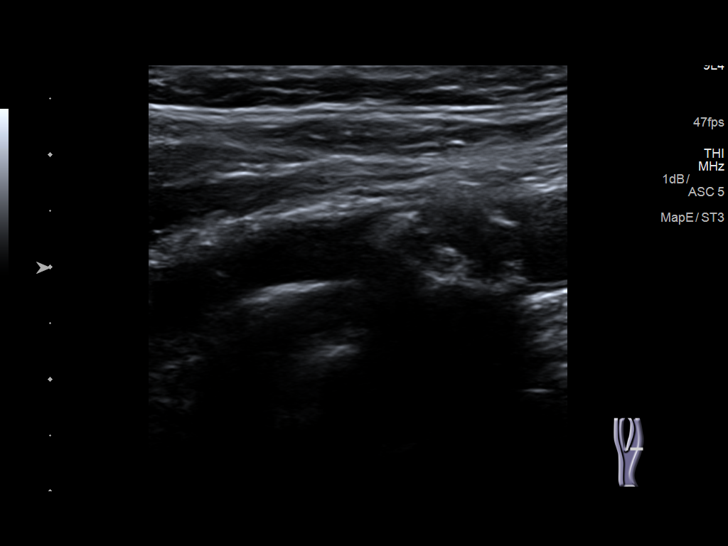
[im 68/75]
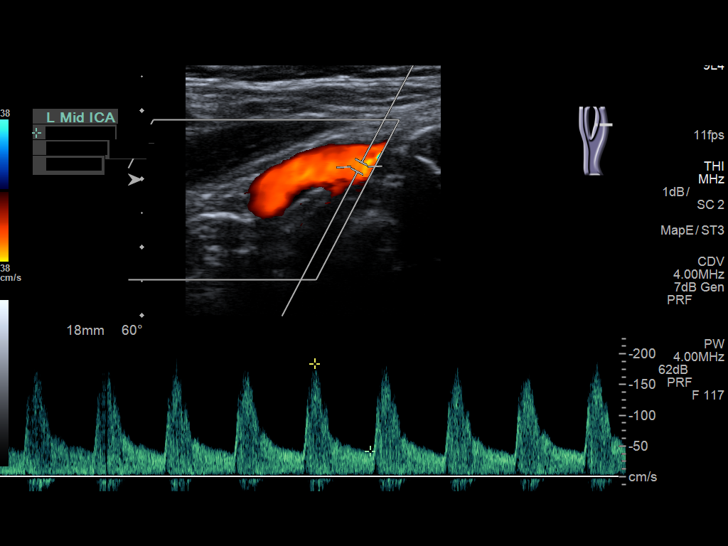
[im 75/75]
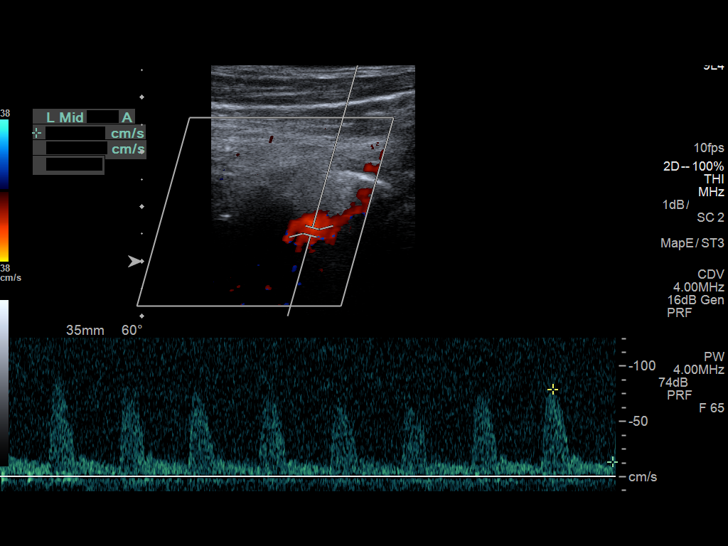

[13 of 24 positions shown; findings below may reference images not displayed]

FINDINGS: Criteria: Quantification of carotid stenosis is based on velocity
parameters that correlate the residual internal carotid diameter
with NASCET-based stenosis levels, using the diameter of the distal
internal carotid lumen as the denominator for stenosis measurement.

The following velocity measurements were obtained:

RIGHT

ICA:  156 cm/sec

CCA:  100 cm/sec

SYSTOLIC ICA/CCA RATIO:

DIASTOLIC ICA/CCA RATIO:

ECA:  163 cm/sec

LEFT

ICA:  184 cm/sec

CCA:  97 cm/sec

SYSTOLIC ICA/CCA RATIO:

DIASTOLIC ICA/CCA RATIO:

ECA:  231 cm/sec

RIGHT CAROTID ARTERY: Moderate mixed plaque in the bulb. Low
resistance internal carotid Doppler pattern.

RIGHT VERTEBRAL ARTERY:  Antegrade.

LEFT CAROTID ARTERY: Moderate mixed plaque in the bulb. Low
resistance internal carotid Doppler pattern.

LEFT VERTEBRAL ARTERY:  Antegrade.
IMPRESSION: 50-69% stenosis in the right and left internal carotid arteries. No
significant change.

## 2018-09-09 ENCOUNTER — Other Ambulatory Visit: Payer: Self-pay | Admitting: Internal Medicine

## 2018-09-09 IMAGING — DX DG CHEST 2V
2 series · 2 of 2 positions shown · non-contrast
Comparison: Radiographs May 28, 2015.

CLINICAL DATA: Pneumonia.

EXAM:
CHEST - 2 VIEW

[chest pa]
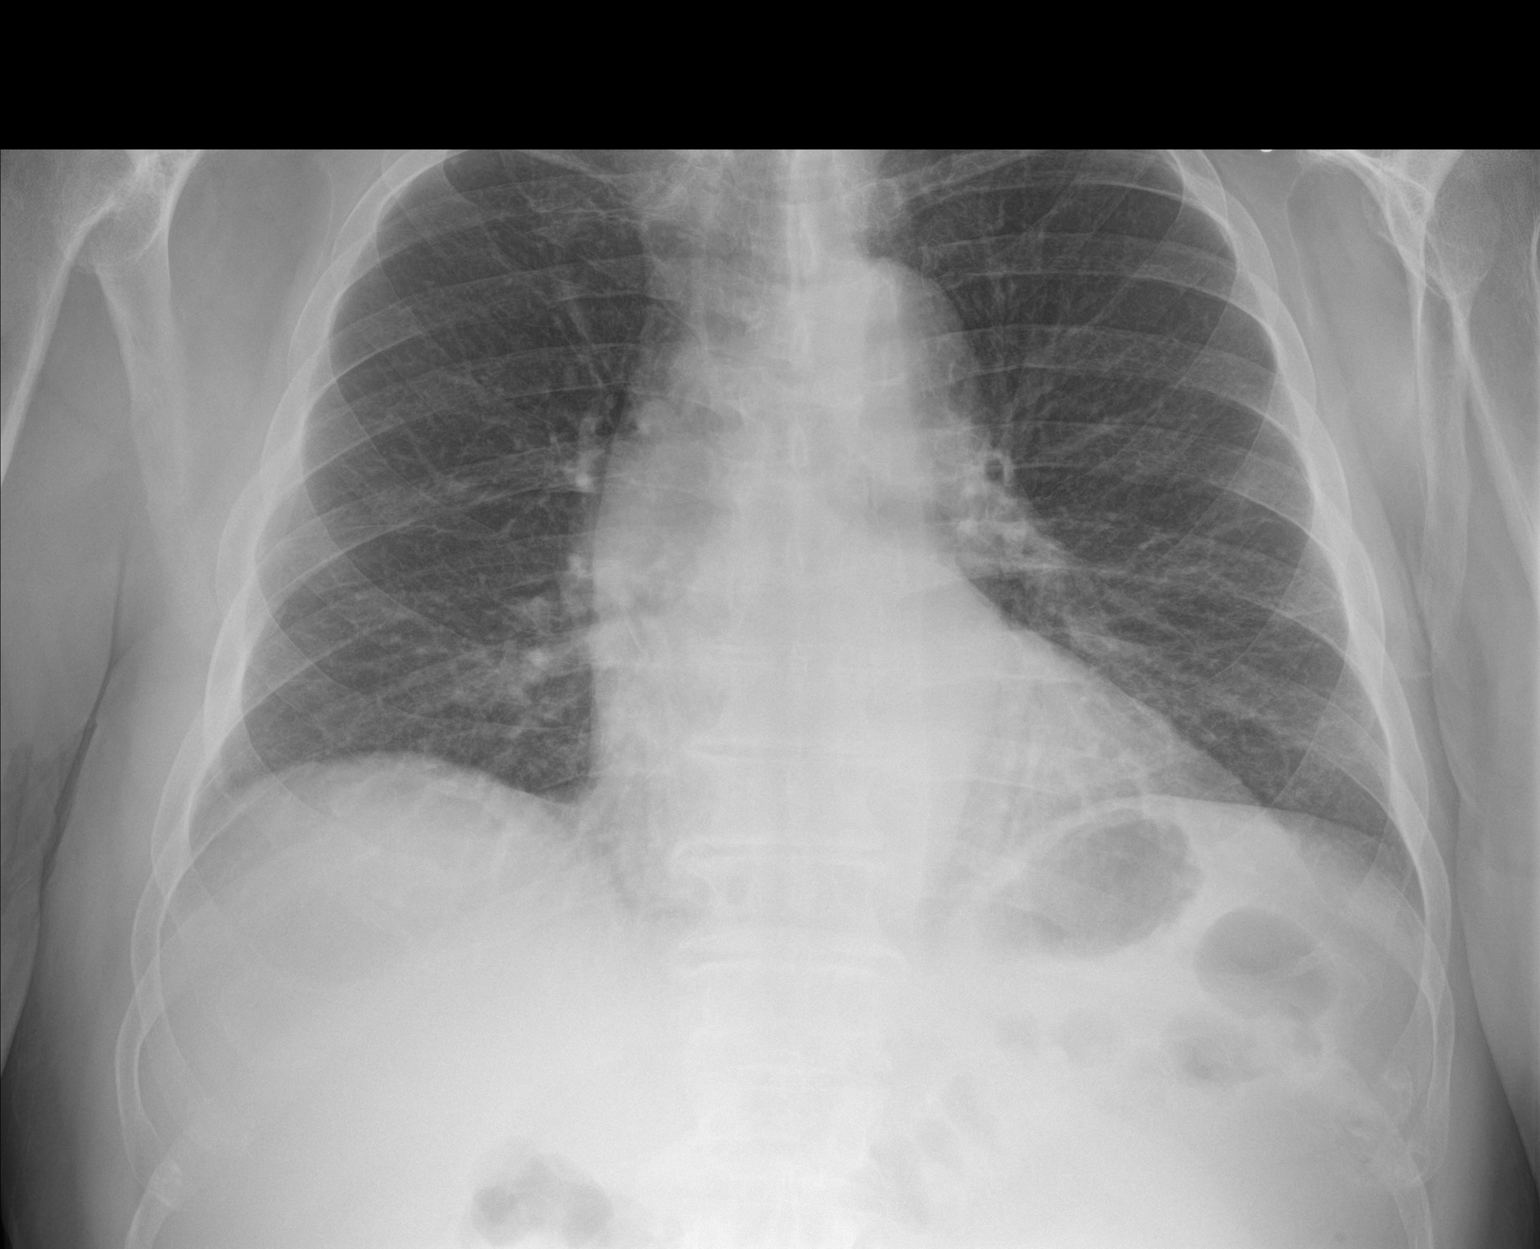

[chest lat]
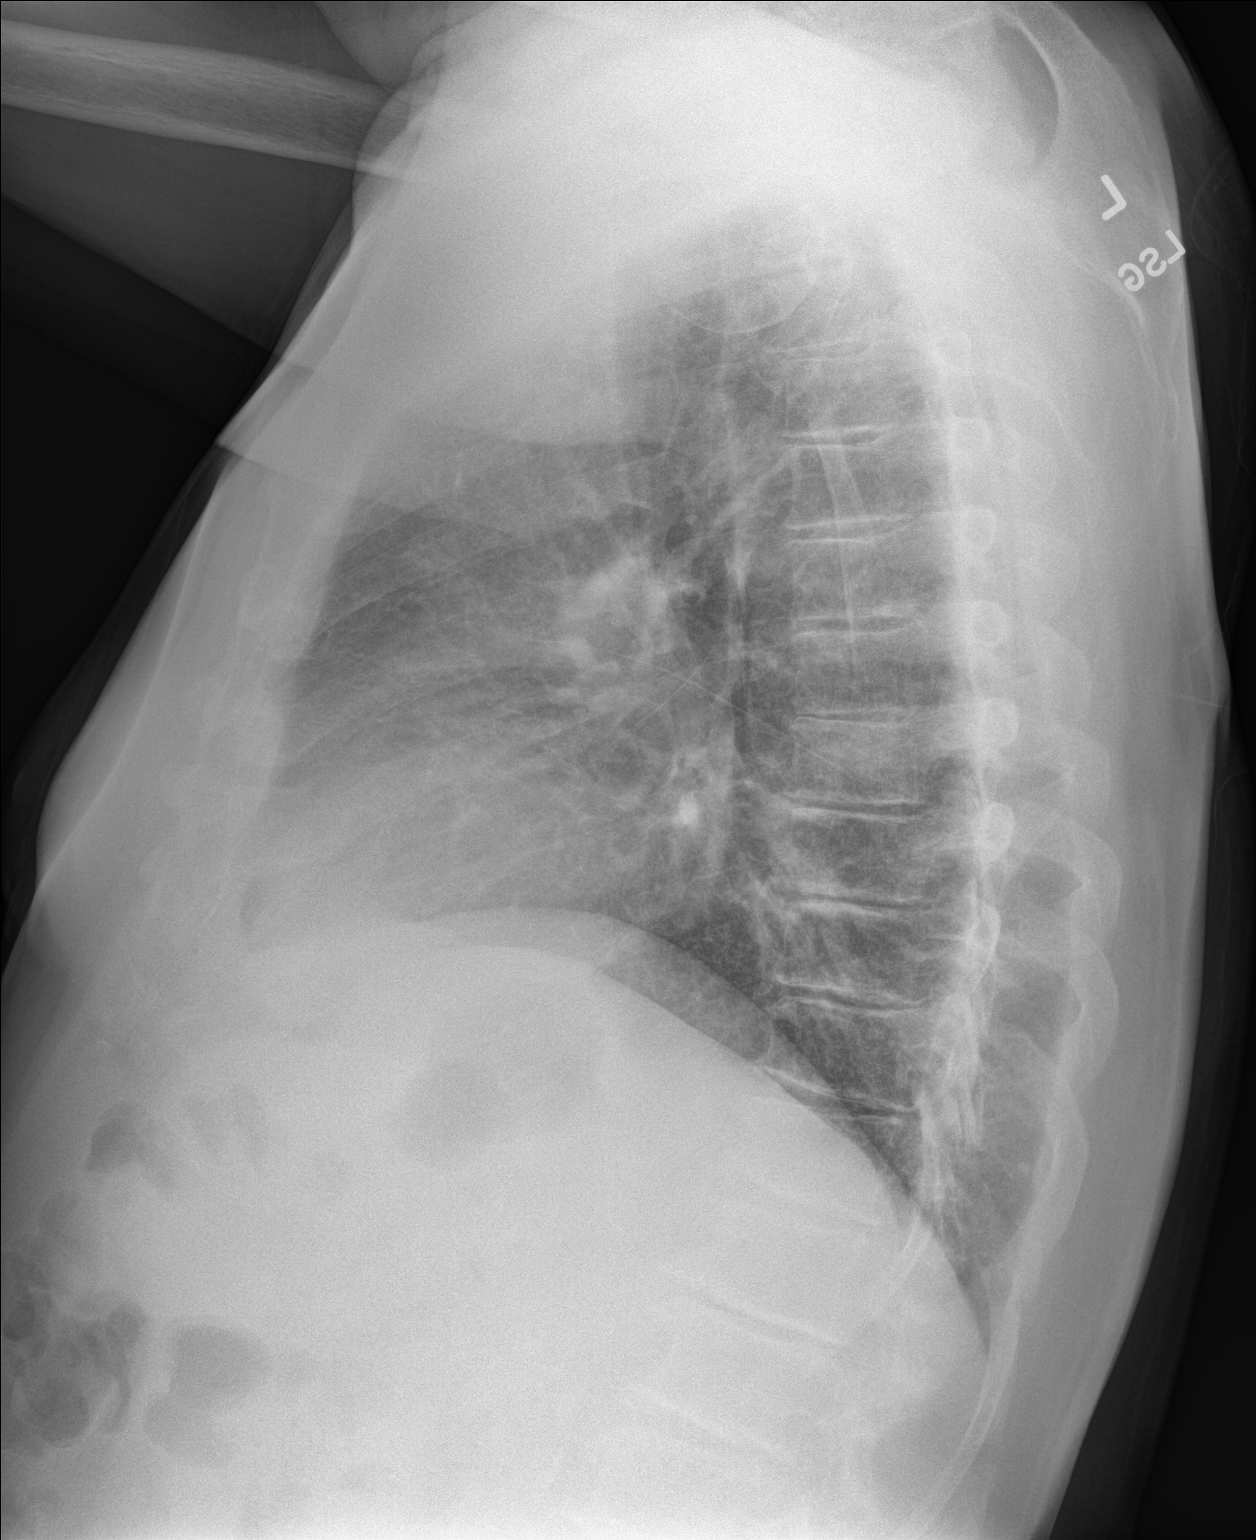

[2 of 2 positions shown; findings below may reference images not displayed]

FINDINGS: The heart size and mediastinal contours are within normal limits.
Both lungs are clear. No pneumothorax or pleural effusion is noted.
The visualized skeletal structures are unremarkable.
IMPRESSION: No active cardiopulmonary disease.

## 2018-09-09 NOTE — Telephone Encounter (Signed)
Last filled 08-07-18 #60 Last OV 05-14-18 Next OV 11-22-18 [facility] [address]

## 2018-10-01 ENCOUNTER — Other Ambulatory Visit: Payer: Self-pay | Admitting: Internal Medicine

## 2018-10-09 ENCOUNTER — Other Ambulatory Visit: Payer: Self-pay | Admitting: Internal Medicine

## 2018-10-09 NOTE — Telephone Encounter (Signed)
Last filled 09-09-18 #60 Last OV 05-14-18 Next OV 11-22-18 [facility] [address] [employer]

## 2018-10-18 ENCOUNTER — Other Ambulatory Visit (HOSPITAL_COMMUNITY): Payer: Self-pay | Admitting: Cardiovascular Disease

## 2018-10-18 DIAGNOSIS — I739 Peripheral vascular disease, unspecified: Secondary | ICD-10-CM

## 2018-10-25 ENCOUNTER — Ambulatory Visit (HOSPITAL_BASED_OUTPATIENT_CLINIC_OR_DEPARTMENT_OTHER)
Admission: RE | Admit: 2018-10-25 | Discharge: 2018-10-25 | Disposition: A | Discharge status: Home / Self Care | Payer: Medicare Other | Source: Ambulatory Visit | Attending: Cardiovascular Disease | Admitting: Cardiovascular Disease

## 2018-10-25 ENCOUNTER — Other Ambulatory Visit: Payer: Self-pay

## 2018-10-25 ENCOUNTER — Ambulatory Visit (HOSPITAL_COMMUNITY)
Admission: RE | Admit: 2018-10-25 | Discharge: 2018-10-25 | Disposition: A | Discharge status: Home / Self Care | Payer: Medicare Other | Source: Ambulatory Visit | Attending: Cardiology | Admitting: Cardiology

## 2018-10-25 ENCOUNTER — Other Ambulatory Visit (HOSPITAL_COMMUNITY): Payer: Self-pay | Admitting: Cardiovascular Disease

## 2018-10-25 DIAGNOSIS — I739 Peripheral vascular disease, unspecified: Secondary | ICD-10-CM | POA: Insufficient documentation

## 2018-10-25 DIAGNOSIS — Z95828 Presence of other vascular implants and grafts: Secondary | ICD-10-CM

## 2018-10-30 ENCOUNTER — Encounter (HOSPITAL_COMMUNITY): Payer: Medicare Other

## 2018-11-04 ENCOUNTER — Telehealth: Payer: Self-pay | Admitting: Cardiovascular Disease

## 2018-11-04 ENCOUNTER — Other Ambulatory Visit: Payer: Self-pay | Admitting: Internal Medicine

## 2018-11-04 NOTE — Telephone Encounter (Signed)
Patient calling calling for his vascular test results.

## 2018-11-04 NOTE — Telephone Encounter (Addendum)
Pt updated with results a voiced understanding. Pt requesting results be mailed for his record keeping.

## 2018-11-05 NOTE — Telephone Encounter (Signed)
Last filled 10-09-18 #60 Last OV 01-18-18 Next OV 11-22-18 [facility] [address] [facility] [employer]

## 2018-11-22 ENCOUNTER — Encounter: Payer: Self-pay | Admitting: Internal Medicine

## 2018-11-22 ENCOUNTER — Other Ambulatory Visit: Payer: Self-pay

## 2018-11-22 ENCOUNTER — Ambulatory Visit (INDEPENDENT_AMBULATORY_CARE_PROVIDER_SITE_OTHER): Payer: Medicare Other | Admitting: Internal Medicine

## 2018-11-22 VITALS — BP 136/72 | HR 74 | Temp 98.6°F | Ht 67.0 in | Wt 211.0 lb

## 2018-11-22 DIAGNOSIS — M159 Polyosteoarthritis, unspecified: Secondary | ICD-10-CM | POA: Diagnosis not present

## 2018-11-22 DIAGNOSIS — Z1211 Encounter for screening for malignant neoplasm of colon: Secondary | ICD-10-CM

## 2018-11-22 DIAGNOSIS — Z0000 Encounter for general adult medical examination without abnormal findings: Secondary | ICD-10-CM

## 2018-11-22 DIAGNOSIS — I739 Peripheral vascular disease, unspecified: Secondary | ICD-10-CM

## 2018-11-22 DIAGNOSIS — K219 Gastro-esophageal reflux disease without esophagitis: Secondary | ICD-10-CM | POA: Diagnosis not present

## 2018-11-22 DIAGNOSIS — I6523 Occlusion and stenosis of bilateral carotid arteries: Secondary | ICD-10-CM

## 2018-11-22 DIAGNOSIS — Z7189 Other specified counseling: Secondary | ICD-10-CM

## 2018-11-22 DIAGNOSIS — E538 Deficiency of other specified B group vitamins: Secondary | ICD-10-CM | POA: Diagnosis not present

## 2018-11-22 LAB — LIPID PANEL
Cholesterol: 82 mg/dL (ref 0–200)
HDL: 52.2 mg/dL (ref >39.00)
LDL Cholesterol: 22 mg/dL (ref 0–99)
NonHDL: 30.04
Total CHOL/HDL Ratio: 2
Triglycerides: 41 mg/dL (ref 0.0–149.0)
VLDL: 8.2 mg/dL (ref 0.0–40.0)

## 2018-11-22 LAB — COMPREHENSIVE METABOLIC PANEL
ALT: 15 U/L (ref 0–53)
AST: 13 U/L (ref 0–37)
Albumin: 4.4 g/dL (ref 3.5–5.2)
Alkaline Phosphatase: 37 U/L — ABNORMAL LOW (ref 39–117)
BUN: 24 mg/dL — ABNORMAL HIGH (ref 6–23)
CO2: 28 mEq/L (ref 19–32)
Calcium: 8.9 mg/dL (ref 8.4–10.5)
Chloride: 103 mEq/L (ref 96–112)
Creatinine, Ser: 0.92 mg/dL (ref 0.40–1.50)
GFR: 80.28 mL/min (ref >60.00)
Glucose, Bld: 100 mg/dL — ABNORMAL HIGH (ref 70–99)
Potassium: 4.2 mEq/L (ref 3.5–5.1)
Sodium: 138 mEq/L (ref 135–145)
Total Bilirubin: 0.5 mg/dL (ref 0.2–1.2)
Total Protein: 7 g/dL (ref 6.0–8.3)

## 2018-11-22 LAB — CBC
HCT: 31.3 % — ABNORMAL LOW (ref 39.0–52.0)
Hemoglobin: 10.6 g/dL — ABNORMAL LOW (ref 13.0–17.0)
MCHC: 33.9 g/dL (ref 30.0–36.0)
MCV: 113.5 fl — ABNORMAL HIGH (ref 78.0–100.0)
Platelets: 312 10*3/uL (ref 150.0–400.0)
RBC: 2.76 Mil/uL — ABNORMAL LOW (ref 4.22–5.81)
RDW: 14.8 % (ref 11.5–15.5)
WBC: 7.9 10*3/uL (ref 4.0–10.5)

## 2018-11-22 LAB — VITAMIN B12: Vitamin B-12: 739 pg/mL (ref 211–911)

## 2018-11-22 NOTE — Assessment & Plan Note (Signed)
Controlled with cimetidine now

## 2018-11-22 NOTE — Assessment & Plan Note (Signed)
More prominent in hands/wrists now No true synovitis to suggest RA, etc He will try voltaren gel

## 2018-11-22 NOTE — Assessment & Plan Note (Signed)
See social history 

## 2018-11-22 NOTE — Assessment & Plan Note (Signed)
I have personally reviewed the Medicare Annual Wellness questionnaire and have noted 1. The patient's medical and social history 2. Their use of alcohol, tobacco or illicit drugs 3. Their current medications and supplements 4. The patient's functional ability including ADL's, fall risks, home safety risks and hearing or visual             impairment. 5. Diet and physical activities 6. Evidence for depression or mood disorders  The patients weight, height, BMI and visual acuity have been recorded in the chart I have made referrals, counseling and provided education to the patient based review of the above and I have provided the pt with a written personalized care plan for preventive services.  I have provided you with a copy of your personalized plan for preventive services. Please take the time to review along with your updated medication list.  Flu vaccine in the fall Td is any injury No PSA due to age FIT Discussed fitness

## 2018-11-22 NOTE — Assessment & Plan Note (Signed)
Will recheck levels

## 2018-11-22 NOTE — Progress Notes (Signed)
Subjective:    Patient ID: Aaron Sexton, male    DOB: 1946/04/15, 73 y.o.   MRN: [id number]  HPI Here for Medicare wellness visit and follow up of chronic health conditions Reviewed form and advanced directives Reviewed other doctors No tobacco or alcohol Vision is fine Hearing is okay No falls No depression or anhedonia Independent with instrumental ADLs--cooks, yard/garden work Memory seems to be fine  Having some pain in both wrists and ankles Figures it is arthritis Constant pain---they do swell (as well as his hands) AM stiffness till he puts hot water on them Also with finger numbness Uses tramadol bid generally---this does help No FH of RA, etc  Keeps up cardiologist and vascular doctor Regular ultrasounds, etc No stroke like symptoms Still gets claudication fairly quickly--but tries to walk No foot ulcers  Stomach quiet on cimetidine now No dysphagia Also takes probiotic  Current Outpatient Medications on File Prior to Visit  Medication Sig Dispense Refill  . aspirin 81 MG tablet Take 81 mg by mouth daily.      . chlorthalidone (HYGROTON) 25 MG tablet TAKE 1/2 TABLET(12.5 MG) BY MOUTH DAILY 45 tablet 3  . cimetidine (TAGAMET) 400 MG tablet Take 400 mg by mouth 2 (two) times daily as needed (indigestion).    . clopidogrel (PLAVIX) 75 MG tablet TAKE 1 TABLET(75 MG) BY MOUTH DAILY WITH BREAKFAST 30 tablet 11  . Cyanocobalamin (VITAMIN B-12) 500 MCG SUBL Place 500 mcg under the tongue daily.    [name] losartan (COZAAR) 25 MG tablet TAKE ONE TABLET BY MOUTH EVERY DAY 90 tablet 3  . metoprolol succinate (TOPROL-XL) 100 MG 24 hr tablet TAKE 1 TABLET BY MOUTH EVERY DAY 90 tablet 3  . nitroGLYCERIN (NITROSTAT) 0.4 MG SL tablet Place 1 tablet (0.4 mg total) under the tongue every 5 (five) minutes as needed for chest pain. 25 tablet 3  . Omega-3 Fatty Acids (FISH OIL PO) Take 1,000 mg by mouth daily.     . pravastatin (PRAVACHOL) 20 MG tablet TAKE 1 TABLET(20 MG) BY MOUTH  DAILY 90 tablet 3  . Probiotic Product (PROBIOTIC PO) Take by mouth.    . traMADol (ULTRAM) 50 MG tablet TAKE 1 TABLET BY MOUTH EVERY 8 HOURS AS NEEDED FOR PAIN 60 tablet 0   No current facility-administered medications on file prior to visit.     Allergies  Allergen Reactions  . Diltiazem Hcl Hives and Rash    Past Medical History:  Diagnosis Date  . Arteriosclerotic cardiovascular disease (ASCVD) 1993   Critical RCA disease in 1993 treated with PTCA  . Cerebrovascular disease    Moderate ASVD without focal stenosis in 01/2007  . Colon polyps 3/12   single 2mm polyp--tubular adenoma  . Diverticulosis 2012   found on colonoscopy  . Erectile dysfunction   . Family history of adverse reaction to anesthesia    difficult for son & sistor to wake   . GERD (gastroesophageal reflux disease)   . Hematochezia 05/24/2010  . Hyperlipidemia   . Osteoarthritis    knees/hands-Dr Sherlean Foot  . Overweight(278.02)   . Peripheral vascular disease (HCC) 03/11/2010   Moderate SFA stenosis; history of claudication  . Rosacea   . Tobacco abuse, in remission    30-40 pack years discontinued in 1993  . Vitamin B12 deficiency     Past Surgical History:  Procedure Laterality Date  . APPENDECTOMY    . COLONOSCOPY W/ POLYPECTOMY  2012  . ILIAC VEIN ANGIOPLASTY / STENTING Right 08/09/2015  .  PERIPHERAL VASCULAR CATHETERIZATION Bilateral 06/21/2015   Procedure: Lower Extremity Angiography;  Surgeon: Runell Gess, MD;  Location: [facility] INVASIVE CV LAB;  Service: Cardiovascular;  Laterality: Bilateral;  . PERIPHERAL VASCULAR CATHETERIZATION N/A 06/21/2015   Procedure: Abdominal Aortogram;  Surgeon: Runell Gess, MD;  Location: [facility] INVASIVE CV LAB;  Service: Cardiovascular;  Laterality: N/A;  . PERIPHERAL VASCULAR CATHETERIZATION N/A 07/26/2015   Procedure: Lower Extremity Angiography;  Surgeon: Runell Gess, MD;  Location: [facility] INVASIVE CV LAB;  Service: Cardiovascular;  Laterality: N/A;  . PERIPHERAL  VASCULAR CATHETERIZATION N/A 08/09/2015   Procedure: Lower Extremity Angiography;  Surgeon: Runell Gess, MD;  Location: [facility] INVASIVE CV LAB;  Service: Cardiovascular;  Laterality: N/A;  . PERIPHERAL VASCULAR CATHETERIZATION  08/09/2015   Procedure: Peripheral Vascular Intervention;  Surgeon: Runell Gess, MD;  Location: [facility] INVASIVE CV LAB;  Service: Cardiovascular;;  rt ext. iliac atherectomy and stent  . PERIPHERAL VASCULAR CATHETERIZATION N/A 09/20/2015   Procedure: Lower Extremity Angiography;  Surgeon: Runell Gess, MD;  Location: [facility] INVASIVE CV LAB;  Service: Cardiovascular;  Laterality: N/A;  . PERIPHERAL VASCULAR CATHETERIZATION Right 09/20/2015   Procedure: Peripheral Vascular Intervention;  Surgeon: Runell Gess, MD;  Location: [facility] INVASIVE CV LAB;  Service: Cardiovascular;  Laterality: Right;  SFA  . ROTATOR CUFF REPAIR Left 7/15   Dr Sherlean Foot    Family History  Problem Relation Age of Onset  . Hypertension Father        And siblings  . Heart disease Father        And second-degree relatives  . Transient ischemic attack Father   . Lung cancer Mother   . Cancer Sister        brain cancer    Social History   Socioeconomic History  . Marital status: Married    Spouse name: Not on file  . Number of children: Not on file  . Years of education: Not on file  . Highest education level: Not on file  Occupational History  . 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 Financial resource strain: Not on file  . Food insecurity    Worry: Not on file    Inability: Not on file  . Transportation needs    Medical: Not on file    Non-medical: Not on file  Tobacco Use  . Smoking status: Former Smoker    Packs/day: 1.00    Years: 30.00    Pack years: 30.00    Quit date: 05/16/1990    Years since quitting: 28.5  . Smokeless tobacco: Never Used  . Tobacco comment: Quit in 1993  Substance and Sexual Activity  .  Alcohol use: No    Alcohol/week: 0.0 standard drinks  . Drug use: No  . Sexual activity: Not on file  Lifestyle  . Physical activity    Days per week: Not on file    Minutes per session: Not on file  . Stress: Not on file  Relationships  . Social Musician on phone: Not on file    Gets together: Not on file    Attends religious service: Not on file    Active member of club or organization: Not on file    Attends meetings of clubs or organizations: Not on file    Relationship status: Not on file  . Intimate partner violence    Fear of current or ex partner: Not on file  Emotionally abused: Not on file    Physically abused: Not on file    Forced sexual activity: Not on file  Other Topics Concern  . Not on file  Social History Narrative   Has living will   Wife is health care POA---alternate is son Algernon Huxley   Would accept resuscitation but no prolonged artificial ventilation.   No tube feeds if cognitively unaware   Review of Systems Appetite is okay Weight is about the same Sleeps fine Wears seat belt Teeth are fine--keeps up with dentist No suspicious skin lesions--no rash Bowels are fine--no blood Voids fine. No nocturia. Stream is fine Some AM back pain also    Objective:   Physical Exam  Constitutional: He is oriented to person, place, and time. He appears well-developed. No distress.  HENT:  Mouth/Throat: Oropharynx is clear and moist. No oropharyngeal exudate.  Neck: No thyromegaly present.  Cardiovascular: Normal rate, regular rhythm and normal heart sounds. Exam reveals no gallop.  No murmur heard. Feet warm but no palpable pulses  Respiratory: Effort normal and breath sounds normal. No respiratory distress. He has no wheezes. He has no rales.  GI: Soft. There is no abdominal tenderness.  Musculoskeletal:        General: No tenderness or edema.     Comments: No synovitis  Lymphadenopathy:    He has no cervical adenopathy.  Neurological: He is  alert and oriented to person, place, and time.  President--- "Magda Paganini" [phone] [id number] Recall 3/3  Skin: No rash noted. No erythema.  Psychiatric: He has a normal mood and affect. His behavior is normal.           Assessment & Plan:

## 2018-11-22 NOTE — Assessment & Plan Note (Signed)
Limited in exercise Discussed resistance alternatives Regular follow up Clopidogrel/statin

## 2018-11-22 NOTE — Assessment & Plan Note (Signed)
No TIA symptoms Ultrasound stable

## 2018-11-23 ENCOUNTER — Other Ambulatory Visit: Payer: Self-pay | Admitting: Cardiology

## 2018-11-25 ENCOUNTER — Other Ambulatory Visit: Payer: Self-pay | Admitting: Cardiology

## 2018-11-29 ENCOUNTER — Telehealth: Payer: Self-pay

## 2018-11-29 MED ORDER — PRAVASTATIN SODIUM 40 MG PO TABS: 40.0000 mg | ORAL_TABLET | Freq: Every day | ORAL | 3 refills | Status: DC

## 2018-11-29 NOTE — Telephone Encounter (Signed)
Sounds good. Thanks 

## 2018-11-29 NOTE — Telephone Encounter (Signed)
Received a letter from [facility] suggesting pt increase his pravastatin for his ASCVD from 20mg  to at least 40mg .   I spoke to the pt and he is in agreement. He will try the increased dosage of 40mg . He will take 2 of his 20mg  until he runs out. I sent a new rx for 40mg  to [facility], also.  I faxed the form back to [facility].

## 2018-12-03 ENCOUNTER — Telehealth: Payer: Self-pay | Admitting: Radiology

## 2018-12-03 ENCOUNTER — Other Ambulatory Visit (INDEPENDENT_AMBULATORY_CARE_PROVIDER_SITE_OTHER): Payer: Medicare Other

## 2018-12-03 DIAGNOSIS — Z1211 Encounter for screening for malignant neoplasm of colon: Secondary | ICD-10-CM | POA: Diagnosis not present

## 2018-12-03 LAB — FECAL OCCULT BLOOD, IMMUNOCHEMICAL: Fecal Occult Bld: POSITIVE — AB

## 2018-12-03 NOTE — Telephone Encounter (Signed)
[facility] called a POSITIVE ifob, results given to [facility].

## 2018-12-04 ENCOUNTER — Telehealth: Payer: Self-pay

## 2018-12-04 NOTE — Telephone Encounter (Signed)
Left message for patient to call back  

## 2018-12-04 NOTE — Telephone Encounter (Signed)
See lab result note.

## 2018-12-04 NOTE — Telephone Encounter (Signed)
Would recommend follow up with PCP re: positive IFOB and to discuss further testing if needed

## 2018-12-05 ENCOUNTER — Other Ambulatory Visit: Payer: Self-pay | Admitting: Internal Medicine

## 2018-12-05 NOTE — Telephone Encounter (Signed)
See lab notes

## 2018-12-05 NOTE — Telephone Encounter (Signed)
Patient returned call from office.   C/B # [phone]

## 2018-12-05 NOTE — Telephone Encounter (Signed)
Last filled 11-05-18 #60 Last OV 11-22-18 Next OV 12-02-18 [facility] [address] [facility] to [employer] in Dr Alla German absence

## 2018-12-07 ENCOUNTER — Other Ambulatory Visit: Payer: Self-pay | Admitting: Internal Medicine

## 2018-12-07 DIAGNOSIS — R195 Other fecal abnormalities: Secondary | ICD-10-CM

## 2018-12-16 ENCOUNTER — Telehealth: Payer: Self-pay | Admitting: Gastroenterology

## 2018-12-16 NOTE — Telephone Encounter (Signed)
Dr. Loletha Carrow, pt requested you as a GI MD.  Pt was referred for a colonoscopy; previous colonoscopy and path reports in 2012 from [facility] are in [place] and will be sent to you for review.

## 2018-12-17 NOTE — Telephone Encounter (Signed)
I would be glad to see him.   He has been referred for heme positive stool.  They were unable to complete his 2012 colonoscopy, and he is on plavix for vascular disease. Please make him a new patient office appointment with me.

## 2019-01-06 ENCOUNTER — Other Ambulatory Visit: Payer: Self-pay | Admitting: Internal Medicine

## 2019-01-07 NOTE — Telephone Encounter (Signed)
Last filled 12-05-18 #60 Last OV 11-22-18 Next OV 12-02-19 [facility] [address] [facility] [employer]

## 2019-01-21 ENCOUNTER — Other Ambulatory Visit (INDEPENDENT_AMBULATORY_CARE_PROVIDER_SITE_OTHER): Payer: Medicare Other

## 2019-01-21 ENCOUNTER — Encounter: Payer: Self-pay | Admitting: Gastroenterology

## 2019-01-21 ENCOUNTER — Telehealth: Payer: Self-pay

## 2019-01-21 ENCOUNTER — Ambulatory Visit (INDEPENDENT_AMBULATORY_CARE_PROVIDER_SITE_OTHER): Payer: Medicare Other | Admitting: Gastroenterology

## 2019-01-21 ENCOUNTER — Encounter (INDEPENDENT_AMBULATORY_CARE_PROVIDER_SITE_OTHER): Payer: Self-pay

## 2019-01-21 VITALS — BP 132/60 | HR 76 | Temp 98.4°F | Ht 67.0 in | Wt 212.2 lb

## 2019-01-21 DIAGNOSIS — D539 Nutritional anemia, unspecified: Secondary | ICD-10-CM

## 2019-01-21 DIAGNOSIS — I739 Peripheral vascular disease, unspecified: Secondary | ICD-10-CM

## 2019-01-21 DIAGNOSIS — R195 Other fecal abnormalities: Secondary | ICD-10-CM

## 2019-01-21 DIAGNOSIS — Z86010 Personal history of colonic polyps: Secondary | ICD-10-CM

## 2019-01-21 LAB — IBC + FERRITIN
Ferritin: 1001.5 ng/mL — ABNORMAL HIGH (ref 22.0–322.0)
Iron: 145 ug/dL (ref 42–165)
Saturation Ratios: 63.2 % — ABNORMAL HIGH (ref 20.0–50.0)
Transferrin: 164 mg/dL — ABNORMAL LOW (ref 212.0–360.0)

## 2019-01-21 LAB — FOLATE: Folate: 9.3 ng/mL (ref >5.9)

## 2019-01-21 MED ORDER — NA SULFATE-K SULFATE-MG SULF 17.5-3.13-1.6 GM/177ML PO SOLN: 1.0000 | Freq: Once | ORAL | 0 refills | Status: AC

## 2019-01-21 NOTE — Telephone Encounter (Signed)
[facility] Pre-operative Risk Assessment     Request for surgical clearance:     Endoscopy Procedure  What type of surgery is being performed?     Colonoscopy   When is this surgery scheduled?     02-17-2019  What type of clearance is required ?   Pharmacy  Are there any medications that need to be held prior to surgery and how long? Yes, Plavix, 5 days  Practice name and name of physician performing surgery?      [facility]  What is your office phone and fax number?      Phone- [phone]  Fax630 [phone]  Anesthesia type (None, local, MAC, general) ?       MAC

## 2019-01-21 NOTE — Progress Notes (Signed)
[facility] Consult Note:  History: Aaron Sexton 01/21/2019  Referring provider: Venia Carbon, MD  Reason for consult/chief complaint: positive FIT (pt on Plavix) and Anemia   Subjective  HPI:  This is a very pleasant 73 year old man referred by primary care for anemia and heme positive stool.  He apparently has stool check for occult blood annually, and this was the first time it was positive. He denies abdominal pain, nausea vomiting or weight loss.  He has intermittent regurgitation and pyrosis apparently well-controlled with as needed Tagamet.  He denies dysphagia or odynophagia. No rectal bleeding.  Years of intermittent diarrhea that he and his wife both feel is under control if he takes a daily probiotic.  He underwent colonoscopy with Dr. Gustavo Lah in [place] in March 2012.  It was an incomplete exam to the splenic flexure due to a tortuous colon.  A diminutive descending colon tubular adenoma was removed, he has had no further colonoscopies since then.    ROS:  Review of Systems  Constitutional: Negative for appetite change and unexpected weight change.  HENT: Negative for mouth sores and voice change.   Eyes: Negative for pain and redness.  Respiratory: Negative for cough and shortness of breath.   Cardiovascular: Positive for chest pain. Negative for palpitations.       Claudication  Genitourinary: Negative for dysuria and hematuria.  Musculoskeletal: Positive for arthralgias. Negative for myalgias.  Skin: Negative for pallor and rash.  Neurological: Negative for weakness and headaches.  Hematological: Negative for adenopathy.   Chronic stable angina  Past Medical History: Past Medical History:  Diagnosis Date  . Anemia   . Arteriosclerotic cardiovascular disease (ASCVD) 1993   Critical RCA disease in 1993 treated with PTCA  . Cerebrovascular disease    Moderate ASVD without focal stenosis in 01/2007  . Colon polyps 3/12   single 34m polyp--tubular adenoma  . Diverticulosis 2012   found on colonoscopy  . Erectile dysfunction   . Family history of adverse reaction to anesthesia    difficult for son & sistor to wake   . GERD (gastroesophageal reflux disease)   . Hematochezia 05/24/2010  . Hyperlipidemia   . Osteoarthritis    knees/hands-Dr LRonnie Derby . Overweight(278.02)   . Peripheral vascular disease ([facility] 03/11/2010   Moderate SFA stenosis; history of claudication  . Pneumonia   . Rosacea   . Tobacco abuse, in remission    30-40 pack years discontinued in 1993  . Vitamin B12 deficiency      Past Surgical History: Past Surgical History:  Procedure Laterality Date  . APPENDECTOMY    . COLONOSCOPY W/ POLYPECTOMY  2012  . ILIAC VEIN ANGIOPLASTY / STENTING Right 08/09/2015  . PERIPHERAL VASCULAR CATHETERIZATION Bilateral 06/21/2015   Procedure: Lower Extremity Angiography;  Surgeon: JLorretta Harp MD;  [facility];  Service: Cardiovascular;  Laterality: Bilateral;  . PERIPHERAL VASCULAR CATHETERIZATION N/A 06/21/2015   Procedure: Abdominal Aortogram;  Surgeon: JLorretta Harp MD;  [facility];  Service: Cardiovascular;  Laterality: N/A;  . PERIPHERAL VASCULAR CATHETERIZATION N/A 07/26/2015   Procedure: Lower Extremity Angiography;  Surgeon: JLorretta Harp MD;  [facility];  Service: Cardiovascular;  Laterality: N/A;  . PERIPHERAL VASCULAR CATHETERIZATION N/A 08/09/2015   Procedure: Lower Extremity Angiography;  Surgeon: JLorretta Harp MD;  [facility];  Service: Cardiovascular;  Laterality: N/A;  . PERIPHERAL VASCULAR CATHETERIZATION  08/09/2015   Procedure: Peripheral Vascular  Intervention;  Surgeon: Lorretta Harp, MD;  Location: [place] CV LAB;  Service: Cardiovascular;;  rt ext. iliac atherectomy and stent  . PERIPHERAL VASCULAR CATHETERIZATION N/A 09/20/2015   Procedure: Lower Extremity Angiography;  Surgeon: Lorretta Harp, MD;  Location: [place] CV LAB;  Service: Cardiovascular;  Laterality: N/A;  . PERIPHERAL VASCULAR CATHETERIZATION Right 09/20/2015   Procedure: Peripheral Vascular Intervention;  Surgeon: Lorretta Harp, MD;  Location: [place] CV LAB;  Service: Cardiovascular;  Laterality: Right;  SFA  . ROTATOR CUFF REPAIR Left 7/15   Dr Ronnie Derby     Family History: Family History  Problem Relation Age of Onset  . Hypertension Father        And siblings  . Heart disease Father        And second-degree relatives  . Transient ischemic attack Father   . Lung cancer Mother   . Diabetes Mother   . Brain cancer Sister   . Diabetes Brother     Social History: Social History   Socioeconomic History  . Marital status: Married    Spouse name: Not on file  . Number of children: Not on file  . Years of education: Not on file  . Highest education level: Not on file  Occupational History  . Occupation: Managed supply chain--- retired 2013    Comment: [employer]  . Financial resource strain: Not on file  . Food insecurity    Worry: Not on file    Inability: Not on file  . Transportation needs    Medical: Not on file    Non-medical: Not on file  Tobacco Use  . Smoking status: Former Smoker    Packs/day: 1.00    Years: 30.00    Pack years: 30.00    Quit date: 05/16/1990    Years since quitting: 28.7  . Smokeless tobacco: Never Used  . Tobacco comment: Quit in 1993  Substance and Sexual Activity  . Alcohol use: No    Alcohol/week: 0.0 standard drinks  . Drug use: No  . Sexual activity: Not on file  Lifestyle  . Physical activity    Days per week: Not on file    Minutes per session: Not on file  . Stress: Not on file  Relationships  . Social Herbalist on phone: Not on file    Gets together: Not on file    Attends religious service: Not on file    Active member of club or organization: Not on file    Attends meetings of clubs or  organizations: Not on file    Relationship status: Not on file  Other Topics Concern  . Not on file  Social History Narrative   Has living will   Wife is health care POA---alternate is son Monica Martinez   Would accept resuscitation but no prolonged artificial ventilation.   No tube feeds if cognitively unaware    Allergies: Allergies  Allergen Reactions  . Diltiazem Hcl Hives and Rash  . Cardizem [Diltiazem Hcl]     Outpatient Meds: Current Outpatient Medications  Medication Sig Dispense Refill  . aspirin 81 MG tablet Take 81 mg by mouth daily.      . chlorthalidone (HYGROTON) 25 MG tablet TAKE 1/2 TABLET(12.5 MG) BY MOUTH DAILY 45 tablet 3  . cimetidine (TAGAMET) 400 MG tablet Take 400 mg by mouth 2 (two) times daily as needed (indigestion).    . clopidogrel (PLAVIX) 75 MG tablet TAKE  1 TABLET(75 MG) BY MOUTH DAILY WITH BREAKFAST 30 tablet 11  . Cyanocobalamin (VITAMIN B-12) 500 MCG SUBL Place 500 mcg under the tongue daily.    [name] losartan (COZAAR) 25 MG tablet TAKE ONE TABLET BY MOUTH EVERY DAY 90 tablet 3  . metoprolol succinate (TOPROL-XL) 100 MG 24 hr tablet TAKE 1 TABLET BY MOUTH EVERY DAY 90 tablet 3  . nitroGLYCERIN (NITROSTAT) 0.4 MG SL tablet Place 1 tablet (0.4 mg total) under the tongue every 5 (five) minutes as needed for chest pain. 25 tablet 3  . Omega-3 Fatty Acids (FISH OIL PO) Take 1,000 mg by mouth daily.     . pravastatin (PRAVACHOL) 40 MG tablet Take 1 tablet (40 mg total) by mouth daily. 90 tablet 3  . Probiotic Product (PROBIOTIC PO) Take by mouth.    . traMADol (ULTRAM) 50 MG tablet TAKE 1 TABLET BY MOUTH EVERY 8 HOURS AS NEEDED FOR PAIN 60 tablet 0  . Na Sulfate-K Sulfate-Mg Sulf 17.5-3.13-1.6 GM/177ML SOLN Take 1 kit by mouth once for 1 dose. 354 mL 0   No current facility-administered medications for this visit.       ___________________________________________________________________ Objective   Exam:  BP 132/60 (BP Location: Left Arm, Patient Position:  Sitting, Cuff Size: Normal)   Pulse 76   Temp 98.4 F (36.9 C)   Ht _0  (1.702 m) Comment: height measured without shoes  Wt 212 lb 4 oz (96.3 kg)   BMI 33.24 kg/m    General: Well-appearing, pleasant and conversational.  Antalgic gait, gets on exam table without assistance or his cane.  Eyes: sclera anicteric, no redness  ENT: oral mucosa moist without lesions, no cervical or supraclavicular lymphadenopathy  CV: RRR without murmur, S1/S2, no JVD, no peripheral edema  Resp: clear to auscultation bilaterally, normal RR and effort noted  GI: soft, no tenderness, with active bowel sounds. No guarding or palpable organomegaly noted.  Skin; warm and dry, no rash or jaundice noted  Neuro: awake, alert and oriented x 3. Normal gross motor function and fluent speech  Labs:  CBC Latest Ref Rng & Units 11/22/2018 11/16/2017 09/07/2017  WBC 4.0 - 10.5 K/uL 7.9 7.8 7.6  Hemoglobin 13.0 - 17.0 g/dL 10.6(L) 12.5(L) 10.3(L)  Hematocrit 39.0 - 52.0 % 31.3(L) 37.0(L) 31.0(L)  Platelets 150.0 - 400.0 K/uL 312.0 293.0 335.0   MCV 113 B12 = 739 on 05/24/55 No folic acid level on file No iron levels on file  FOBT positive recently   Assessment: Encounter Diagnoses  Name Primary?  . Heme positive stool Yes  . Macrocytic anemia   . Personal history of colonic polyps   . Peripheral vascular disease (HCC)     Heme positive stool, of uncertain relation to the macrocytic anemia.  No localizing GI symptoms regarding possible source of blood loss. PAD, last intervention 2017, on DAPT.  Plan:  Colonoscopy.  He was agreeable after discussion of procedure and risks.  The benefits and risks of the planned procedure were described in detail with the patient or (when appropriate) their health care proxy.  Risks were outlined as including, but not limited to, bleeding, infection, perforation, adverse medication reaction leading to cardiac or pulmonary decompensation.  The limitation of incomplete  mucosal visualization was also discussed.  No guarantees or warranties were given.  Patient at increased risk for cardiopulmonary complications of procedure due to medical comorbidities.  He understands the small but real risk of vascular event if he is off Plavix 5 days before  the procedure (but remaining on aspirin).  I expect his cardiologist will be agreeable to this, given how long it has been since last intervention, but we will consult with them on this. He also understands the possibility I may also not be able to navigate the tortuosity discovered on last colonoscopy.  I sent him to the lab today for folic acid and iron levels because of the macrocytic anemia.  Thank you for the courtesy of this consult.  Please call me with any questions or concerns.  Nelida Meuse III  CC: Referring provider noted above

## 2019-01-21 NOTE — Patient Instructions (Signed)
If you are age 75 or older, your body mass index should be between 23-30. Your Body mass index is 33.24 kg/m. If this is out of the aforementioned range listed, please consider follow up with your Primary Care Provider.  If you are age 2 or younger, your body mass index should be between 19-25. Your Body mass index is 33.24 kg/m. If this is out of the aformentioned range listed, please consider follow up with your Primary Care Provider.   You have been scheduled for a colonoscopy. Please follow written instructions given to you at your visit today.  Please pick up your prep supplies at the pharmacy within the next 1-3 days. If you use inhalers (even only as needed), please bring them with you on the day of your procedure. Your physician has requested that you go to www.startemmi.com and enter the access code given to you at your visit today. This web site gives a general overview about your procedure. However, you should still follow specific instructions given to you by our office regarding your preparation for the procedure.  Your provider has requested that you go to the basement level for lab work before leaving today. Press "B" on the elevator. The lab is located at the first door on the left as you exit the elevator.   It was a pleasure to see you today!  Dr. Loletha Carrow

## 2019-01-22 NOTE — Telephone Encounter (Signed)
Okay to hold Plavix for his procedure

## 2019-01-22 NOTE — Telephone Encounter (Signed)
Ok to hold plavix for procedure   J Tinnie Kunin MD 

## 2019-01-22 NOTE — Telephone Encounter (Signed)
Dr. Harl Bowie and Dr. Gwenlyn Found  Please comment on whether or not the patient can hold Plavix for 5 days for upcoming colo. Richardson Dopp, PA-C    01/22/2019 10:02 AM

## 2019-01-22 NOTE — Telephone Encounter (Signed)
Patient has been notified and aware. He states clear understanding to hold his Plavix for 5 days.

## 2019-01-22 NOTE — Telephone Encounter (Signed)
Left a message to return call.  

## 2019-01-24 ENCOUNTER — Other Ambulatory Visit: Payer: Self-pay | Admitting: *Deleted

## 2019-01-24 DIAGNOSIS — R195 Other fecal abnormalities: Secondary | ICD-10-CM

## 2019-01-27 ENCOUNTER — Other Ambulatory Visit: Payer: Medicare Other

## 2019-01-27 DIAGNOSIS — R195 Other fecal abnormalities: Secondary | ICD-10-CM

## 2019-01-31 ENCOUNTER — Other Ambulatory Visit: Payer: Self-pay | Admitting: Internal Medicine

## 2019-01-31 LAB — HEMOCHROMATOSIS DNA-PCR(C282Y,H63D)

## 2019-02-03 ENCOUNTER — Other Ambulatory Visit: Payer: Self-pay | Admitting: Internal Medicine

## 2019-02-03 ENCOUNTER — Encounter: Payer: Self-pay | Admitting: Gastroenterology

## 2019-02-03 NOTE — Telephone Encounter (Signed)
Last filled 01-07-19 #60 Last OV 11-22-18 Next OV 12-02-19 [facility] [address] [facility] [employer]

## 2019-02-06 ENCOUNTER — Telehealth: Payer: Self-pay | Admitting: Gastroenterology

## 2019-02-10 NOTE — Telephone Encounter (Signed)
Refer to result note on 10/2.

## 2019-02-17 ENCOUNTER — Ambulatory Visit (AMBULATORY_SURGERY_CENTER): Payer: Medicare Other | Admitting: Gastroenterology

## 2019-02-17 ENCOUNTER — Encounter: Payer: Self-pay | Admitting: Gastroenterology

## 2019-02-17 ENCOUNTER — Other Ambulatory Visit: Payer: Self-pay

## 2019-02-17 VITALS — BP 117/60 | HR 63 | Temp 98.0°F | Resp 19 | Ht 67.0 in | Wt 212.0 lb

## 2019-02-17 DIAGNOSIS — D122 Benign neoplasm of ascending colon: Secondary | ICD-10-CM | POA: Diagnosis not present

## 2019-02-17 DIAGNOSIS — R195 Other fecal abnormalities: Secondary | ICD-10-CM

## 2019-02-17 DIAGNOSIS — D120 Benign neoplasm of cecum: Secondary | ICD-10-CM

## 2019-02-17 DIAGNOSIS — D124 Benign neoplasm of descending colon: Secondary | ICD-10-CM | POA: Diagnosis not present

## 2019-02-17 DIAGNOSIS — K5730 Diverticulosis of large intestine without perforation or abscess without bleeding: Secondary | ICD-10-CM

## 2019-02-17 MED ORDER — SODIUM CHLORIDE 0.9 % IV SOLN: 500.0000 mL | Freq: Once | INTRAVENOUS | Status: DC

## 2019-02-17 NOTE — Progress Notes (Signed)
To PACU, VSS. Report to Rn.tb 

## 2019-02-17 NOTE — Patient Instructions (Addendum)
YOU HAD AN ENDOSCOPIC PROCEDURE TODAY AT [facility]:   Refer to the procedure report that was given to you for any specific questions about what was found during the examination.  If the procedure report does not answer your questions, please call your gastroenterologist to clarify.  If you requested that your care partner not be given the details of your procedure findings, then the procedure report has been included in a sealed envelope for you to review at your convenience later.  YOU SHOULD EXPECT: Some feelings of bloating in the abdomen. Passage of more gas than usual.  Walking can help get rid of the air that was put into your GI tract during the procedure and reduce the bloating. If you had a lower endoscopy (such as a colonoscopy or flexible sigmoidoscopy) you may notice spotting of blood in your stool or on the toilet paper. If you underwent a bowel prep for your procedure, you may not have a normal bowel movement for a few days.  Please Note:  You might notice some irritation and congestion in your nose or some drainage.  This is from the oxygen used during your procedure.  There is no need for concern and it should clear up in a day or so.  SYMPTOMS TO REPORT IMMEDIATELY:   Following lower endoscopy (colonoscopy or flexible sigmoidoscopy):  Excessive amounts of blood in the stool  Significant tenderness or worsening of abdominal pains  Swelling of the abdomen that is new, acute  Fever of 100F or higher   For urgent or emergent issues, a gastroenterologist can be reached at any hour by calling [phone].   DIET:  We do recommend a small meal at first, but then you may proceed to your regular diet.  Drink plenty of fluids but you should avoid alcoholic beverages for 24 hours.  ACTIVITY:  You should plan to take it easy for the rest of today and you should NOT DRIVE or use heavy machinery until tomorrow (because of the sedation medicines used during the test).     FOLLOW UP: Our staff will call the number listed on your records 48-72 hours following your procedure to check on you and address any questions or concerns that you may have regarding the information given to you following your procedure. If we do not reach you, we will leave a message.  We will attempt to reach you two times.  During this call, we will ask if you have developed any symptoms of COVID 19. If you develop any symptoms (ie: fever, flu-like symptoms, shortness of breath, cough etc.) before then, please call [phone].  If you test positive for Covid 19 in the 2 weeks post procedure, please call and report this information to [place].    If any biopsies were taken you will be contacted by phone or by letter within the next 1-3 weeks.  Please call us at [phone] if you have not heard about the biopsies in 3 weeks.    SIGNATURES/CONFIDENTIALITY: You and/or your care partner have signed paperwork which will be entered into your electronic medical record.  These signatures attest to the fact that that the information above on your After Visit Summary has been reviewed and is understood.  Full responsibility of the confidentiality of this discharge information lies with you and/or your care-partner.    Handouts were given to your on polyps and diverticulosis. Restart PLAVIX in 3 days on Thursday, at prior dose. You may resume your current  medications today. Await biopsy results. Please call if any questions or concerns.

## 2019-02-17 NOTE — Op Note (Signed)
[facility] Patient Name: Aaron Sexton Procedure Date: 02/17/2019 2:07 PM MRN: [id number] Endoscopist: Sherilyn Cooter L. Myrtie Neither , MD Age: 73 Referring MD:  Date of Birth: 1945/08/04 Gender: Male Account #: [id number] Procedure:                Colonoscopy Indications:              Heme positive stool; macrocytic anemia (with                            elevated iron levels and compound heterozygous for                            [facility] - hematology referral pending) Medicines:                Monitored Anesthesia Care Procedure:                Pre-Anesthesia Assessment:                           - Prior to the procedure, a History and Physical                            was performed, and patient medications and                            allergies were reviewed. The patient's tolerance of                            previous anesthesia was also reviewed. The risks                            and benefits of the procedure and the sedation                            options and risks were discussed with the patient.                            All questions were answered, and informed consent                            was obtained. Prior Anticoagulants: The patient has                            taken Plavix (clopidogrel), last dose was 5 days                            prior to procedure. ASA Grade Assessment: II - A                            patient with mild systemic disease. After reviewing                            the risks and benefits, the patient was deemed in  satisfactory condition to undergo the procedure.                           After obtaining informed consent, the colonoscope                            was passed under direct vision. Throughout the                            procedure, the patient's blood pressure, pulse, and                            oxygen saturations were monitored continuously. The                            Colonoscope was  introduced through the anus and                            advanced to the the cecum, identified by                            appendiceal orifice and ileocecal valve. The                            colonoscopy was performed with difficulty due to                            multiple diverticula in the colon, a redundant                            colon and significant looping. The patient                            tolerated the procedure well. The quality of the                            bowel preparation was good. The ileocecal valve,                            appendiceal orifice, and rectum were photographed. Scope In: 2:14:01 PM Scope Out: 3:05:38 PM Scope Withdrawal Time: 0 hours 43 minutes 7 seconds  Total Procedure Duration: 0 hours 51 minutes 37 seconds  Findings:                 The perianal and digital rectal examinations were                            normal.                           Many diverticula were found in the left colon.                            There was associated tortuosity narrowing, making  scope passage challenging. Also caused redundancy                            and scope looping.                           A 12 mm polyp was found in the cecum. The polyp was                            sessile. The polyp was removed with a piecemeal                            technique using a hot snare. Scope position                            challenging due to anatomy (see above) Resection                            and retrieval were complete. To prevent bleeding                            post-intervention, two hemostatic clips were                            successfully placed (MR conditional).                           Three sessile polyps were found in the ascending                            colon. The polyps were 4 to 6 mm in size. These                            polyps were removed with a cold snare. Resection                             and retrieval were complete.                           A 5 mm polyp was found in the descending colon. The                            polyp was sessile. The polyp was removed with a                            cold snare. Resection and retrieval were complete.                           The exam was otherwise without abnormality on                            direct and retroflexion views. Complications:            No immediate complications. Estimated  Blood Loss:     Estimated blood loss was minimal. Impression:               - Diverticulosis in the left colon.                           - One 12 mm polyp in the cecum, removed piecemeal                            using a hot snare. Resected and retrieved. Clips                            (MR conditional) were placed.                           - Three 4 to 6 mm polyps in the ascending colon,                            removed with a cold snare. Resected and retrieved.                           - One 5 mm polyp in the descending colon, removed                            with a cold snare. Resected and retrieved.                           - The examination was otherwise normal on direct                            and retroflexion views. Recommendation:           - Patient has a contact number available for                            emergencies. The signs and symptoms of potential                            delayed complications were discussed with the                            patient. Return to normal activities tomorrow.                            Written discharge instructions were provided to the                            patient.                           - Resume previous diet.                           - Resume Plavix (clopidogrel) at prior dose in 3  days.                           - Await pathology results.                           - No recommendation at this time regarding repeat                             colonoscopy. [name], MD 02/17/2019 3:15:35 PM This report has been signed electronically.

## 2019-02-17 NOTE — Progress Notes (Signed)
Per Dr. Davonna Belling to take asa 81 mg daily.  Pt does not need to hold this. Pt ask that I go over the discharge instructions with his wife.  Pt's son is the driver and he did not want me to give results to his son.  I called the son, it was his phone number and asked that he get the car and bring it to the main entrance and ask his mother to come back to the recovery room.   I went over discharge instructions with pt's wife also and explained to hold PLAVIX until Thursday.  Explained 2 clips were placed in pt's cecum in his colon.  Clip card given to pt's wife and asked her to place this in his wallet and if  A MRI is ordered please show this this metal clip card.  maw

## 2019-02-17 NOTE — Progress Notes (Signed)
Vs by Janett Billow by JB front desk

## 2019-02-19 ENCOUNTER — Telehealth: Payer: Self-pay

## 2019-02-19 NOTE — Telephone Encounter (Signed)
  Follow up Call-  Call back number 02/17/2019  Post procedure Call Back phone  # [phone]  Permission to leave phone message Yes  Some recent data might be hidden     Patient questions:  Do you have a fever, pain , or abdominal swelling? No. Pain Score  0 *  Have you tolerated food without any problems? Yes.    Have you been able to return to your normal activities? Yes.    Do you have any questions about your discharge instructions: Diet   No. Medications  No. Follow up visit  No.  Do you have questions or concerns about your Care? No.  Actions: * If pain score is 4 or above: 1. No action needed, pain <4.Have you developed a fever since your procedure? no  2.   Have you had an respiratory symptoms (SOB or cough) since your procedure? no  3.   Have you tested positive for COVID 19 since your procedure no  4.   Have you had any family members/close contacts diagnosed with the COVID 19 since your procedure?  no   If yes to any of these questions please route to Joylene John, RN and Alphonsa Gin, Therapist, sports.

## 2019-02-25 ENCOUNTER — Encounter: Payer: Self-pay | Admitting: Gastroenterology

## 2019-03-07 ENCOUNTER — Other Ambulatory Visit: Payer: Self-pay

## 2019-03-07 MED ORDER — TRAMADOL HCL 50 MG PO TABS: 50.0000 mg | ORAL_TABLET | Freq: Three times a day (TID) | ORAL | 0 refills | Status: DC | PRN

## 2019-03-07 NOTE — Telephone Encounter (Signed)
refilled 

## 2019-03-07 NOTE — Telephone Encounter (Signed)
Patient states that he needs refill on his Tramadol and that he has 2 pills left and he takes this 2 times daily. Patient states that pharmacy has contacted [place] about this and has not heard back. I advised patient that I am sorry for the delay but I do not see any requests for this medication prior to now in the computer at leas,t unless it is on paper request. Since Dr Silvio Pate is not here and he will run out over the weekend advised patient I would see if another provider can help.   Last filled on 02/03/2019 #60 with 0 refill. Last OV 11-22-18 Next OV 12-02-19 [facility] [address]

## 2019-04-07 ENCOUNTER — Other Ambulatory Visit: Payer: Self-pay | Admitting: Internal Medicine

## 2019-04-08 NOTE — Telephone Encounter (Signed)
Tramadol 50mg  Last filled on 03/07/2019 #60 with 0 refill. Last OV 11-22-18 Next OV 12-02-19 [facility] [address]

## 2019-05-05 ENCOUNTER — Other Ambulatory Visit: Payer: Self-pay | Admitting: Internal Medicine

## 2019-05-05 ENCOUNTER — Other Ambulatory Visit: Payer: Self-pay | Admitting: Cardiovascular Disease

## 2019-05-05 NOTE — Telephone Encounter (Signed)
Last filled 04-08-19 #60 Last OV 11-22-18 Next OV 12-02-19 [facility] [address]

## 2019-05-16 ENCOUNTER — Telehealth: Payer: Medicare Other | Admitting: Cardiology

## 2019-05-27 ENCOUNTER — Ambulatory Visit: Payer: Medicare Other | Admitting: Cardiology

## 2019-05-27 ENCOUNTER — Encounter: Payer: Self-pay | Admitting: Cardiology

## 2019-05-27 VITALS — BP 143/63 | HR 74 | Temp 97.3°F | Ht 69.0 in | Wt 212.0 lb

## 2019-05-27 DIAGNOSIS — I6523 Occlusion and stenosis of bilateral carotid arteries: Secondary | ICD-10-CM | POA: Diagnosis not present

## 2019-05-27 DIAGNOSIS — I2510 Atherosclerotic heart disease of native coronary artery without angina pectoris: Secondary | ICD-10-CM | POA: Diagnosis not present

## 2019-05-27 DIAGNOSIS — I10 Essential (primary) hypertension: Secondary | ICD-10-CM | POA: Diagnosis not present

## 2019-05-27 DIAGNOSIS — E782 Mixed hyperlipidemia: Secondary | ICD-10-CM | POA: Diagnosis not present

## 2019-05-27 NOTE — Progress Notes (Signed)
Clinical Summary Mr. Goda is a 74 y.o.male seen today for follow up of the following medical problems.  1. CAD  - prior PTCA to RCA in 1993   - no recent chest pain - compliant with meds  2. Carotid stenosis  - Jan 2020 [place] 50-69% bilateal disease -no recent symptoms  3. Hyperlipidemia  - compliant with statin 11/2018 TC 82 TG 41 HDL 52 LDL 22   4. Claudication/PAD  bilateral cramping pain in calves with walking at < 1 block. No pain at rest. No sores on feet. Not improved with pletal after increasing to 100mg  bid, he asked to stop this medication and we did - ABIs L 0.59 and R 0.68. Waveforms suggestive of inflow disease. .  - followed by Dr Gwenlyn Found.  - 09/20/15 failed attempt at percutanous revasc of right SFA CTO, recs for continued medical therapy.  - 08/09/15 stenting of right EIA - from notes prior failed attempt at left SFA stenting as well   - stable symptoms  5. HTN  - home bp's 120s/70s.  - compliant with meds   Past Medical History:  Diagnosis Date  . Anemia   . Arteriosclerotic cardiovascular disease (ASCVD) 1993   Critical RCA disease in 1993 treated with PTCA  . Cerebrovascular disease    Moderate ASVD without focal stenosis in 01/2007  . Colon polyps 3/12   single 23mm polyp--tubular adenoma  . Diverticulosis 2012   found on colonoscopy  . Erectile dysfunction   . Family history of adverse reaction to anesthesia    difficult for son & sistor to wake   . GERD (gastroesophageal reflux disease)   . Hematochezia 05/24/2010  . Hyperlipidemia   . Osteoarthritis    knees/hands-Dr Ronnie Derby  . Overweight(278.02)   . Peripheral vascular disease ([place]) 03/11/2010   Moderate SFA stenosis; history of claudication  . Pneumonia   . Rosacea   . Tobacco abuse, in remission    30-40 pack years discontinued in 1993  . Vitamin B12 deficiency      Allergies  Allergen Reactions  . Diltiazem Hcl Hives and Rash  . Cardizem [Diltiazem Hcl]       Current Outpatient Medications  Medication Sig Dispense Refill  . aspirin 81 MG tablet Take 81 mg by mouth daily.      . chlorthalidone (HYGROTON) 25 MG tablet TAKE 1/2 TABLET(12.5 MG) BY MOUTH DAILY 45 tablet 3  . cimetidine (TAGAMET) 400 MG tablet Take 400 mg by mouth 2 (two) times daily as needed (indigestion).    . clopidogrel (PLAVIX) 75 MG tablet TAKE 1 TABLET(75 MG) BY MOUTH DAILY WITH BREAKFAST 30 tablet 11  . Cyanocobalamin (VITAMIN B-12) 500 MCG SUBL Place 500 mcg under the tongue daily.    [name] losartan (COZAAR) 25 MG tablet TAKE ONE TABLET BY MOUTH EVERY DAY 90 tablet 3  . metoprolol succinate (TOPROL-XL) 100 MG 24 hr tablet TAKE 1 TABLET BY MOUTH EVERY DAY 90 tablet 3  . nitroGLYCERIN (NITROSTAT) 0.4 MG SL tablet Place 1 tablet (0.4 mg total) under the tongue every 5 (five) minutes as needed for chest pain. 25 tablet 3  . Omega-3 Fatty Acids (FISH OIL PO) Take 1,000 mg by mouth daily.     . pravastatin (PRAVACHOL) 40 MG tablet Take 1 tablet (40 mg total) by mouth daily. 90 tablet 3  . Probiotic Product (PROBIOTIC PO) Take by mouth.    . traMADol (ULTRAM) 50 MG tablet TAKE 1 TABLET(50 MG) BY MOUTH EVERY  8 HOURS AS NEEDED FOR PAIN 60 tablet 0   No current facility-administered medications for this visit.     Past Surgical History:  Procedure Laterality Date  . APPENDECTOMY    . COLONOSCOPY W/ POLYPECTOMY  2012  . ILIAC VEIN ANGIOPLASTY / STENTING Right 08/09/2015  . PERIPHERAL VASCULAR CATHETERIZATION Bilateral 06/21/2015   Procedure: Lower Extremity Angiography;  Surgeon: Lorretta Harp, MD;  Location: [facility];  Service: Cardiovascular;  Laterality: Bilateral;  . PERIPHERAL VASCULAR CATHETERIZATION N/A 06/21/2015   Procedure: Abdominal Aortogram;  Surgeon: Lorretta Harp, MD;  Location: [facility];  Service: Cardiovascular;  Laterality: N/A;  . PERIPHERAL VASCULAR CATHETERIZATION N/A 07/26/2015   Procedure: Lower Extremity Angiography;  Surgeon:  Lorretta Harp, MD;  Location: [facility];  Service: Cardiovascular;  Laterality: N/A;  . PERIPHERAL VASCULAR CATHETERIZATION N/A 08/09/2015   Procedure: Lower Extremity Angiography;  Surgeon: Lorretta Harp, MD;  Location: [facility];  Service: Cardiovascular;  Laterality: N/A;  . PERIPHERAL VASCULAR CATHETERIZATION  08/09/2015   Procedure: Peripheral Vascular Intervention;  Surgeon: Lorretta Harp, MD;  Location: [facility];  Service: Cardiovascular;;  rt ext. iliac atherectomy and stent  . PERIPHERAL VASCULAR CATHETERIZATION N/A 09/20/2015   Procedure: Lower Extremity Angiography;  Surgeon: Lorretta Harp, MD;  Location: [facility];  Service: Cardiovascular;  Laterality: N/A;  . PERIPHERAL VASCULAR CATHETERIZATION Right 09/20/2015   Procedure: Peripheral Vascular Intervention;  Surgeon: Lorretta Harp, MD;  Location: [facility];  Service: Cardiovascular;  Laterality: Right;  SFA  . ROTATOR CUFF REPAIR Left 7/15   Dr Ronnie Derby     Allergies  Allergen Reactions  . Diltiazem Hcl Hives and Rash  . Cardizem [Diltiazem Hcl]       Family History  Problem Relation Age of Onset  . Hypertension Father        And siblings  . Heart disease Father        And second-degree relatives  . Transient ischemic attack Father   . Lung cancer Mother   . Diabetes Mother   . Brain cancer Sister   . Diabetes Brother      Social History Mr. Majkut reports that he quit smoking about 29 years ago. He has a 30.00 pack-year smoking history. He has never used smokeless tobacco. Mr. Lauderman reports no history of alcohol use.   Review of Systems CONSTITUTIONAL: No weight loss, fever, chills, weakness or fatigue.  HEENT: Eyes: No visual loss, blurred vision, double vision or yellow sclerae.No hearing loss, sneezing, congestion, runny nose or sore throat.  SKIN: No rash or itching.  CARDIOVASCULAR: per hpi RESPIRATORY: No shortness of breath, cough or sputum.   GASTROINTESTINAL: No anorexia, nausea, vomiting or diarrhea. No abdominal pain or blood.  GENITOURINARY: No burning on urination, no polyuria NEUROLOGICAL: No headache, dizziness, syncope, paralysis, ataxia, numbness or tingling in the extremities. No change in bowel or bladder control.  MUSCULOSKELETAL: No muscle, back pain, joint pain or stiffness.  LYMPHATICS: No enlarged nodes. No history of splenectomy.  PSYCHIATRIC: No history of depression or anxiety.  ENDOCRINOLOGIC: No reports of sweating, cold or heat intolerance. No polyuria or polydipsia.  [name]   Physical Examination Vitals:   05/27/19 1318  BP: (!) 143/63  Pulse: 74  Temp: (!) 97.3 F (36.3 C)  SpO2: 94%   Filed Weights   05/27/19 1318  Weight: 212 lb (96.2 kg)    Gen: resting comfortably, no acute  distress HEENT: no scleral icterus, pupils equal round and reactive, no palptable cervical adenopathy,  CV: RRR, no m/r/g, no jvd Resp: Clear to auscultation bilaterally GI: abdomen is soft, non-tender, non-distended, normal bowel sounds, no hepatosplenomegaly MSK: extremities are warm, no edema.  Skin: warm, no rash Neuro:  no focal deficits Psych: appropriate affect   Diagnostic Studies 08/2011 Carotid US:  IMPRESSION: No significant change in the bilateral carotid atherosclerosis.  Right ICA narrowing less than 50%.  Moderate left ICA narrowing, 50-69%. Pertinent labs  08/2012: TC 84 TG 71 HDL 40 LDL 29 Na 135 BUN 25 Cr 1.1 Hgb 12.9 Hct 39.3 Plt 280 AST 23 ALT 31 TSH 0.51   03/14/13 Clinic EKG: sinus rhythm, normal axis, no ischemic changes   03/2009 ABI IMPRESSION: Similar pattern of arterial occlusive disease in both lower extremities. Ankle-brachial indices are moderately depressed at rest with segmental evaluation consistent with bilateral SFA occlusive disease and potential component of proximal inflow disease. Further anatomic delineation would be helpful with a study such as CT angiography  to determine exact nature of occlusive disease.  08/2013 ABI FINDINGS:  Right ABI: 0.68  Left ABI: 0.59  Right Lower Extremity: Significantly decreased pressure between the  right brachial pressure and right upper thigh pressure suggests  significant inflow disease. No focal pressure drop between the upper  and lower thigh, or below the knee. Moderately abnormal PVRs from  the thigh to the metatarsals consistent with inflow disease.  Left Lower Extremity: Significantly decreased pressure between the  left brachial pressure and left upper thigh consistent with inflow  disease. No significant pressure drop between the upper thigh and  lower thigh or below the knee. Moderately abnormal PVRs throughout  the left lower extremity consistent with inflow disease.  IMPRESSION:  1. Abnormal bilateral ankle-brachial indices consistent with  moderate peripheral arterial disease which is slightly improved  compared to 03/15/2009. Query interval history of SFA intervention,  or successful exercise program?  2. Based on today's evaluation, the level of disease appears to be  inflow (aortoiliac) bilaterally.  Signed,  Criselda Peaches, MD  [facility]  [facility]   08/2013 Carotid US IMPRESSION:  1. Interval progression of mild right ICA stenosis from less than  50% to an estimated 50- 69%.  2. No significant interval change in moderate (50-69%) left ICA  narrowing.  3. Vertebral arteries are patent with normal antegrade flow.  Signed,   03/2015 Carotid US Right:  Heterogeneous plaque at the carotid bifurcation, with discordant results regarding degree of stenosis. Peak velocity suggests 50%- 69% stenosis, with the ICA/ CCA ratio suggesting a lesser degree of stenosis. If establishing a more accurate degree of stenosis is desired, cerebral angiogram should be considered, or as a second best test,  CTA.  Left:  Heterogeneous plaque at the carotid bifurcation, with discordant results regarding degree of stenosis. Peak velocity suggests 50% -69% stenosis, with the ICA/ CCA ratio suggesting a lesser degree of stenosis. If establishing a more accurate degree of stenosis is desired, cerebral angiogram should be considered, or as a second best test, CTA.  Jan 2019 carotid US IMPRESSION: 50-69% stenosis in the right and left internal carotid arteries. No significant change.    Assessment and Plan  1. CAD  - no recent symptoms, continue currnet meds  2. Carotid stenosis  - continue medical therapy, repeat surveillance [place] next year  3. Hyperlipidemia  -continue statin, request pcp labs  4. Claudication/PAD - continue to follow  with Dr Gwenlyn Found  5. HTN  -elevated in clinic but home numbers at goal, continue current meds   F/u 1 year   Arnoldo Lenis, M.D.

## 2019-05-27 NOTE — Patient Instructions (Signed)

## 2019-06-02 ENCOUNTER — Other Ambulatory Visit: Payer: Self-pay

## 2019-06-02 MED ORDER — CHLORTHALIDONE 25 MG PO TABS: ORAL_TABLET | ORAL | 3 refills | Status: DC

## 2019-06-04 ENCOUNTER — Other Ambulatory Visit: Payer: Self-pay

## 2019-06-04 NOTE — Telephone Encounter (Signed)
Last filled 05/05/2019 #60 with 0 refill Last OV 11-22-18 Next OV 12-02-19 [facility] [address]

## 2019-06-05 MED ORDER — TRAMADOL HCL 50 MG PO TABS: ORAL_TABLET | ORAL | 0 refills | Status: DC

## 2019-07-07 ENCOUNTER — Other Ambulatory Visit: Payer: Self-pay | Admitting: Internal Medicine

## 2019-07-07 NOTE — Telephone Encounter (Signed)
Last filled 06/04/19 #60 Last OV 11-22-18 Next OV 12-02-19 [facility] [address]  Nothing was received from [employer]

## 2019-07-07 NOTE — Telephone Encounter (Signed)
Patient called in regards to refill request, He stated that the pharmacy sent over the request on Friday and did not hear back from [place]i did not see a request in the chart   Patient is completely out of medication   TRAMADOL 50 MG

## 2019-07-08 MED ORDER — TRAMADOL HCL 50 MG PO TABS: ORAL_TABLET | ORAL | 0 refills | Status: DC

## 2019-08-06 ENCOUNTER — Other Ambulatory Visit: Payer: Self-pay | Admitting: Internal Medicine

## 2019-08-06 NOTE — Telephone Encounter (Signed)
Last filled 07-08-19 #60 Last OV 11-22-18 Next OV 12-02-19 [facility] [address] [employer] [address]

## 2019-09-01 ENCOUNTER — Other Ambulatory Visit: Payer: Self-pay

## 2019-09-01 MED ORDER — METOPROLOL SUCCINATE ER 100 MG PO TB24: 100.0000 mg | ORAL_TABLET | Freq: Every day | ORAL | 3 refills | Status: DC

## 2019-09-01 NOTE — Telephone Encounter (Signed)
Refilled toprol

## 2019-09-04 ENCOUNTER — Other Ambulatory Visit: Payer: Self-pay | Admitting: Internal Medicine

## 2019-09-04 NOTE — Telephone Encounter (Signed)
Last filled 08-06-19 #60 Last OV 11-22-18 Next OV 12-02-19 [facility] [address]

## 2019-10-02 ENCOUNTER — Other Ambulatory Visit: Payer: Self-pay | Admitting: Internal Medicine

## 2019-10-02 NOTE — Telephone Encounter (Signed)
Last filled 09-04-19 #60 Last OV 11-22-18 Next OV 12-02-19 [facility] [address]

## 2019-10-27 ENCOUNTER — Ambulatory Visit (HOSPITAL_BASED_OUTPATIENT_CLINIC_OR_DEPARTMENT_OTHER)
Admission: RE | Admit: 2019-10-27 | Discharge: 2019-10-27 | Disposition: A | Discharge status: Home / Self Care | Payer: Medicare PPO | Source: Ambulatory Visit | Attending: Cardiovascular Disease | Admitting: Cardiovascular Disease

## 2019-10-27 ENCOUNTER — Ambulatory Visit (HOSPITAL_COMMUNITY)
Admission: RE | Admit: 2019-10-27 | Discharge: 2019-10-27 | Disposition: A | Discharge status: Home / Self Care | Payer: Medicare PPO | Source: Ambulatory Visit | Attending: Cardiovascular Disease | Admitting: Cardiovascular Disease

## 2019-10-27 ENCOUNTER — Other Ambulatory Visit (HOSPITAL_COMMUNITY): Payer: Self-pay | Admitting: Cardiovascular Disease

## 2019-10-27 ENCOUNTER — Other Ambulatory Visit: Payer: Self-pay

## 2019-10-27 DIAGNOSIS — Z95828 Presence of other vascular implants and grafts: Secondary | ICD-10-CM | POA: Insufficient documentation

## 2019-10-27 DIAGNOSIS — I739 Peripheral vascular disease, unspecified: Secondary | ICD-10-CM | POA: Diagnosis not present

## 2019-10-31 ENCOUNTER — Telehealth: Payer: Self-pay | Admitting: Gastroenterology

## 2019-10-31 ENCOUNTER — Other Ambulatory Visit: Payer: Self-pay

## 2019-10-31 NOTE — Telephone Encounter (Signed)
Spoke with pt. He was contacted in early October and wanted to wait until after colonoscopy to have referral. Referral was not made at that time. Referral entered in epic and pt knows to expect a phone call regarding the appt from the cancer center. Thanked pt for the call.

## 2019-11-03 ENCOUNTER — Other Ambulatory Visit: Payer: Self-pay | Admitting: Internal Medicine

## 2019-11-03 NOTE — Telephone Encounter (Signed)
Last refilled 10/02/19 #60  Last OV 11-22-18 Next OV 12-02-19 [facility] [address] [employer]

## 2019-11-12 ENCOUNTER — Inpatient Hospital Stay (HOSPITAL_COMMUNITY): Payer: Medicare PPO | Attending: Hematology

## 2019-11-12 ENCOUNTER — Encounter (HOSPITAL_COMMUNITY): Payer: Self-pay | Admitting: Hematology

## 2019-11-12 ENCOUNTER — Other Ambulatory Visit: Payer: Self-pay

## 2019-11-12 ENCOUNTER — Inpatient Hospital Stay (HOSPITAL_COMMUNITY): Payer: Medicare PPO | Attending: Hematology | Admitting: Hematology

## 2019-11-12 VITALS — Ht 67.0 in

## 2019-11-12 DIAGNOSIS — K5730 Diverticulosis of large intestine without perforation or abscess without bleeding: Secondary | ICD-10-CM | POA: Insufficient documentation

## 2019-11-12 DIAGNOSIS — D539 Nutritional anemia, unspecified: Secondary | ICD-10-CM | POA: Diagnosis not present

## 2019-11-12 DIAGNOSIS — M1990 Unspecified osteoarthritis, unspecified site: Secondary | ICD-10-CM | POA: Diagnosis not present

## 2019-11-12 DIAGNOSIS — R7989 Other specified abnormal findings of blood chemistry: Secondary | ICD-10-CM | POA: Diagnosis not present

## 2019-11-12 DIAGNOSIS — I2510 Atherosclerotic heart disease of native coronary artery without angina pectoris: Secondary | ICD-10-CM | POA: Diagnosis not present

## 2019-11-12 DIAGNOSIS — D122 Benign neoplasm of ascending colon: Secondary | ICD-10-CM | POA: Diagnosis not present

## 2019-11-12 LAB — COMPREHENSIVE METABOLIC PANEL
ALT: 19 U/L (ref 0–44)
AST: 18 U/L (ref 15–41)
Albumin: 4.2 g/dL (ref 3.5–5.0)
Alkaline Phosphatase: 35 U/L — ABNORMAL LOW (ref 38–126)
Anion gap: 10 (ref 5–15)
BUN: 20 mg/dL (ref 8–23)
CO2: 24 mmol/L (ref 22–32)
Calcium: 8.9 mg/dL (ref 8.9–10.3)
Chloride: 103 mmol/L (ref 98–111)
Creatinine, Ser: 1.01 mg/dL (ref 0.61–1.24)
GFR calc Af Amer: >60 mL/min (ref >60)
GFR calc non Af Amer: >60 mL/min (ref >60)
Glucose, Bld: 93 mg/dL (ref 70–99)
Potassium: 3.6 mmol/L (ref 3.5–5.1)
Sodium: 137 mmol/L (ref 135–145)
Total Bilirubin: 0.5 mg/dL (ref 0.3–1.2)
Total Protein: 7.8 g/dL (ref 6.5–8.1)

## 2019-11-12 LAB — FOLATE: Folate: 12 ng/mL (ref >5.9)

## 2019-11-12 LAB — FERRITIN: Ferritin: 831 ng/mL — ABNORMAL HIGH (ref 24–336)

## 2019-11-12 LAB — VITAMIN B12: Vitamin B-12: 658 pg/mL (ref 180–914)

## 2019-11-12 LAB — CBC WITH DIFFERENTIAL/PLATELET
Abs Immature Granulocytes: 0.02 10*3/uL (ref 0.00–0.07)
Basophils Absolute: 0.1 10*3/uL (ref 0.0–0.1)
Basophils Relative: 1 %
Eosinophils Absolute: 0.2 10*3/uL (ref 0.0–0.5)
Eosinophils Relative: 3 %
HCT: 32.4 % — ABNORMAL LOW (ref 39.0–52.0)
Hemoglobin: 10.6 g/dL — ABNORMAL LOW (ref 13.0–17.0)
Immature Granulocytes: 0 %
Lymphocytes Relative: 31 %
Lymphs Abs: 2.7 10*3/uL (ref 0.7–4.0)
MCH: 37.7 pg — ABNORMAL HIGH (ref 26.0–34.0)
MCHC: 32.7 g/dL (ref 30.0–36.0)
MCV: 115.3 fL — ABNORMAL HIGH (ref 80.0–100.0)
Monocytes Absolute: 0.9 10*3/uL (ref 0.1–1.0)
Monocytes Relative: 11 %
Neutro Abs: 4.7 10*3/uL (ref 1.7–7.7)
Neutrophils Relative %: 54 %
Platelets: 322 10*3/uL (ref 150–400)
RBC: 2.81 MIL/uL — ABNORMAL LOW (ref 4.22–5.81)
RDW: 14.4 % (ref 11.5–15.5)
WBC: 8.6 10*3/uL (ref 4.0–10.5)
nRBC: 0.4 % — ABNORMAL HIGH (ref 0.0–0.2)

## 2019-11-12 LAB — SEDIMENTATION RATE: Sed Rate: 28 mm/hr — ABNORMAL HIGH (ref 0–16)

## 2019-11-12 LAB — C-REACTIVE PROTEIN: CRP: 0.6 mg/dL (ref <1.0)

## 2019-11-12 LAB — HEPATITIS B CORE ANTIBODY, IGM: Hep B C IgM: NONREACTIVE

## 2019-11-12 LAB — IRON AND TIBC
Iron: 145 ug/dL (ref 45–182)
Saturation Ratios: 61 % — ABNORMAL HIGH (ref 17.9–39.5)
TIBC: 239 ug/dL — ABNORMAL LOW (ref 250–450)
UIBC: 94 ug/dL

## 2019-11-12 LAB — LACTATE DEHYDROGENASE: LDH: 130 U/L (ref 98–192)

## 2019-11-12 LAB — HEPATITIS B SURFACE ANTIBODY,QUALITATIVE: Hep B S Ab: NONREACTIVE

## 2019-11-12 LAB — HEPATITIS B SURFACE ANTIGEN: Hepatitis B Surface Ag: NONREACTIVE

## 2019-11-12 LAB — VITAMIN D 25 HYDROXY (VIT D DEFICIENCY, FRACTURES): Vit D, 25-Hydroxy: 13.31 ng/mL — ABNORMAL LOW (ref 30–100)

## 2019-11-12 NOTE — Progress Notes (Signed)
CONSULT NOTE  Patient Care Team: Aaron Carbon, MD as PCP - General (Internal Medicine) Aaron Bowie Alphonse Guild, MD as PCP - Cardiology (Cardiology)  CHIEF COMPLAINTS/PURPOSE OF CONSULTATION: Elevated ferritin  HISTORY OF PRESENTING ILLNESS:  Aaron Sexton 74 y.o. male was sent here by his GI doctor for an elevated ferritin.  Patient was found to be anemic.  And was sent to his GI doctor for work-up.  It was found that his ferritin level was in the 1000s.  He was then sent for consult.  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  Patient denies a family history of hemochromatosis.  Patient denies a history of hepatitis.  Patient denies any alcohol smoking or illicit drug use.  Patient denies use of cast iron pans.  Patient reports he does not take any extra vitamin C or D.  He only takes vitamin B12 and fish oil.  He has never taken oral iron supplementation.  Patient avoids raw seafood.  Patient reports that he does have joint pain that has been worsening over the past few years.  Mostly his hands and hips and knees.  He has never been diagnosed with rheumatoid arthritis nor has he been tested.  Patient denies any abdominal pain.  Patient has not noticed any recent bleeding such as epistasis, hematuria or hematochezia.  He did have positive occult stool cards.  Patient denies any over-the-counter NSAID ingestion.  He has no prior history or diagnosis of cancer.  He reports a positive family history of a mother with lung cancer.  And a sister with glioblastoma.  MEDICAL HISTORY:  Past Medical History:  Diagnosis Date  . Anemia   . Arteriosclerotic cardiovascular disease (ASCVD) 1993   Critical RCA disease in 1993 treated with PTCA  . Cerebrovascular disease    Moderate ASVD without focal stenosis in 01/2007  . Colon polyps 3/12   single 53mm polyp--tubular adenoma  . Diverticulosis 2012   found on colonoscopy  . Erectile dysfunction   . Family history of adverse  reaction to anesthesia    difficult for son & sistor to wake   . GERD (gastroesophageal reflux disease)   . Hematochezia 05/24/2010  . Hyperlipidemia   . Osteoarthritis    knees/hands-Dr Aaron Sexton  . Overweight(278.02)   . Peripheral vascular disease ([place]) 03/11/2010   Moderate SFA stenosis; history of claudication  . Pneumonia   . Rosacea   . Tobacco abuse, in remission    30-40 pack years discontinued in 1993  . Vitamin B12 deficiency     SURGICAL HISTORY: Past Surgical History:  Procedure Laterality Date  . APPENDECTOMY    . COLONOSCOPY W/ POLYPECTOMY  2012  . ILIAC VEIN ANGIOPLASTY / STENTING Right 08/09/2015  . PERIPHERAL VASCULAR CATHETERIZATION Bilateral 06/21/2015   Procedure: Lower Extremity Angiography;  Surgeon: Lorretta Harp, MD;  Location: [facility];  Service: Cardiovascular;  Laterality: Bilateral;  . PERIPHERAL VASCULAR CATHETERIZATION N/A 06/21/2015   Procedure: Abdominal Aortogram;  Surgeon: Lorretta Harp, MD;  Location: [facility];  Service: Cardiovascular;  Laterality: N/A;  . PERIPHERAL VASCULAR CATHETERIZATION N/A 07/26/2015   Procedure: Lower Extremity Angiography;  Surgeon: Lorretta Harp, MD;  Location: [facility];  Service: Cardiovascular;  Laterality: N/A;  . PERIPHERAL VASCULAR CATHETERIZATION N/A 08/09/2015   Procedure: Lower Extremity Angiography;  Surgeon: Lorretta Harp, MD;  Location: [facility];  Service: Cardiovascular;  Laterality: N/A;  . PERIPHERAL VASCULAR CATHETERIZATION  08/09/2015   Procedure: Peripheral Vascular Intervention;  Surgeon: Lorretta Harp, MD;  Location: [facility];  Service: Cardiovascular;;  rt ext. iliac atherectomy and stent  . PERIPHERAL VASCULAR CATHETERIZATION N/A 09/20/2015   Procedure: Lower Extremity Angiography;  Surgeon: Lorretta Harp, MD;  Location: [facility];  Service: Cardiovascular;  Laterality: N/A;  . PERIPHERAL VASCULAR CATHETERIZATION Right 09/20/2015    Procedure: Peripheral Vascular Intervention;  Surgeon: Lorretta Harp, MD;  Location: [facility];  Service: Cardiovascular;  Laterality: Right;  SFA  . ROTATOR CUFF REPAIR Left 7/15   Dr Aaron Sexton    SOCIAL HISTORY: Social History   Socioeconomic History  . Marital status: Married    Spouse name: Not on file  . Number of children: 3  . Years of education: Not on file  . Highest education level: Not on file  Occupational History  . 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 Smoking status: Former Smoker    Packs/day: 1.00    Years: 30.00    Pack years: 30.00    Quit date: 05/16/1990    Years since quitting: 29.5  . Smokeless tobacco: Never Used  . Tobacco comment: Quit in 1993  Vaping Use  . Vaping Use: Never used  Substance and Sexual Activity  . Alcohol use: No    Alcohol/week: 0.0 standard drinks  . Drug use: No  . Sexual activity: Not on file  Other Topics Concern  . Not on file  Social History Narrative   Has living will   Wife is health care POA---alternate is son Aaron Sexton   Would accept resuscitation but no prolonged artificial ventilation.   No tube feeds if cognitively unaware   Social Determinants of Health   Financial Resource Strain:   . Difficulty of Paying Living Expenses:   Food Insecurity:   . Worried About Charity fundraiser in the Last Year:   . Arboriculturist in the Last Year:   Transportation Needs:   . Film/video editor (Medical):   [name] Lack of Transportation (Non-Medical):   Physical Activity:   . Days of Exercise per Week:   . Minutes of Exercise per Session:   Stress:   . Feeling of Stress :   Social Connections:   . Frequency of Communication with Friends and Family:   . Frequency of Social Gatherings with Friends and Family:   . Attends Religious Services:   . Active Member of Clubs or Organizations:   . Attends Archivist Meetings:   [name] Marital Status:    Intimate Partner Violence:   . Fear of Current or Ex-Partner:   . Emotionally Abused:   [name] Physically Abused:   . Sexually Abused:     FAMILY HISTORY: Family History  Problem Relation Age of Onset  . Hypertension Father        And siblings  . Heart disease Father        And second-degree relatives  . Transient ischemic attack Father   . Lung cancer Mother   . Diabetes Mother   . Brain cancer Sister   . Diabetes Brother   . Atrial fibrillation Brother   . Heart disease Sister   . Heart disease Sister   . Thyroid disease Sister   . Ulcers Son   . Diabetes Son   . Diabetes Son     ALLERGIES:  is allergic to diltiazem  hcl and cardizem [diltiazem hcl].  MEDICATIONS:  Current Outpatient Medications  Medication Sig Dispense Refill  . aspirin 81 MG tablet Take 81 mg by mouth daily.      . chlorthalidone (HYGROTON) 25 MG tablet TAKE 1/2 TABLET(12.5 MG) BY MOUTH DAILY 45 tablet 3  . clopidogrel (PLAVIX) 75 MG tablet TAKE 1 TABLET(75 MG) BY MOUTH DAILY WITH BREAKFAST 30 tablet 11  . Cyanocobalamin (VITAMIN B-12) 500 MCG SUBL Place 500 mcg under the tongue daily.    [name] losartan (COZAAR) 25 MG tablet TAKE ONE TABLET BY MOUTH EVERY DAY 90 tablet 3  . metoprolol succinate (TOPROL-XL) 100 MG 24 hr tablet Take 1 tablet (100 mg total) by mouth daily. Take with or immediately following a meal. 90 tablet 3  . Omega-3 Fatty Acids (FISH OIL PO) Take 1,000 mg by mouth daily.     . pravastatin (PRAVACHOL) 40 MG tablet Take 1 tablet (40 mg total) by mouth daily. 90 tablet 3  . Probiotic Product (PROBIOTIC PO) Take by mouth.    . traMADol (ULTRAM) 50 MG tablet TAKE 1 TABLET(50 MG) BY MOUTH EVERY 8 HOURS AS NEEDED FOR PAIN 60 tablet 0  . cimetidine (TAGAMET) 400 MG tablet Take 400 mg by mouth 2 (two) times daily as needed (indigestion). (Patient not taking: Reported on 11/12/2019)    . nitroGLYCERIN (NITROSTAT) 0.4 MG SL tablet Place 1 tablet (0.4 mg total) under the tongue every 5 (five) minutes as  needed for chest pain. (Patient not taking: Reported on 11/12/2019) 25 tablet 3   No current facility-administered medications for this visit.    REVIEW OF SYSTEMS:   Constitutional: Denies fevers, chills or abnormal night sweats Respiratory: Denies cough, dyspnea or wheezes Cardiovascular: Denies palpitation, chest discomfort or lower extremity swelling Gastrointestinal:  Denies nausea, heartburn or change in bowel habits, + constipation, + diarrhea Skin: Denies abnormal skin rashes Lymphatics: Denies new lymphadenopathy or easy bruising Neurological:Denies numbness, tingling or new weaknesses Behavioral/Psych: Mood is stable, no new changes ,+ anxiety All other systems were reviewed with the patient and are negative.  PHYSICAL EXAMINATION: ECOG PERFORMANCE STATUS: 0 - Asymptomatic  There were no vitals filed for this visit. There were no vitals filed for this visit.  GENERAL:alert, no distress and comfortable SKIN: skin color, texture, turgor are normal, no rashes or significant lesions NECK: supple, thyroid normal size, non-tender, without nodularity LYMPH:  no palpable lymphadenopathy in the cervical, axillary or inguinal LUNGS: clear to auscultation and percussion with normal breathing effort HEART: regular rate & rhythm and no murmurs and no lower extremity edema ABDOMEN:abdomen soft, non-tender and normal bowel sounds Musculoskeletal:no cyanosis of digits and no clubbing  PSYCH: alert & oriented x 3 with fluent speech NEURO: no focal motor/sensory deficits  LABORATORY DATA:  I have reviewed the data as listed Recent Results (from the past 2160 hour(s))  CBC with Differential/Platelet     Status: Abnormal   Collection Time: 11/12/19  2:30 PM  Result Value Ref Range   WBC 8.6 4.0 - 10.5 K/uL   RBC 2.81 (L) 4.22 - 5.81 MIL/uL   Hemoglobin 10.6 (L) 13.0 - 17.0 g/dL   HCT 32.4 (L) 39 - 52 %   MCV 115.3 (H) 80.0 - 100.0 fL   MCH 37.7 (H) 26.0 - 34.0 pg   MCHC 32.7 30.0 -  36.0 g/dL   RDW 14.4 11.5 - 15.5 %   Platelets 322 150 - 400 K/uL   nRBC 0.4 (H) 0.0 - 0.2 %  Neutrophils Relative % 54 %   Neutro Abs 4.7 1.7 - 7.7 K/uL   Lymphocytes Relative 31 %   Lymphs Abs 2.7 0.7 - 4.0 K/uL   Monocytes Relative 11 %   Monocytes Absolute 0.9 0 - 1 K/uL   Eosinophils Relative 3 %   Eosinophils Absolute 0.2 0 - 0 K/uL   Basophils Relative 1 %   Basophils Absolute 0.1 0 - 0 K/uL   Immature Granulocytes 0 %   Abs Immature Granulocytes 0.02 0.00 - 0.07 K/uL    Comment: Performed at [facility], [address], [place], [place] [address]  Comprehensive metabolic panel     Status: Abnormal   Collection Time: 11/12/19  2:30 PM  Result Value Ref Range   Sodium 137 135 - 145 mmol/L   Potassium 3.6 3.5 - 5.1 mmol/L   Chloride 103 98 - 111 mmol/L   CO2 24 22 - 32 mmol/L   Glucose, Bld 93 70 - 99 mg/dL    Comment: Glucose reference range applies only to samples taken after fasting for at least 8 hours.   BUN 20 8 - 23 mg/dL   Creatinine, Ser 1.01 0.61 - 1.24 mg/dL   Calcium 8.9 8.9 - 10.3 mg/dL   Total Protein 7.8 6.5 - 8.1 g/dL   Albumin 4.2 3.5 - 5.0 g/dL   AST 18 15 - 41 U/L   ALT 19 0 - 44 U/L   Alkaline Phosphatase 35 (L) 38 - 126 U/L   Total Bilirubin 0.5 0.3 - 1.2 mg/dL   GFR calc non Af Amer >60 >60 mL/min   GFR calc Af Amer >60 >60 mL/min   Anion gap 10 5 - 15    Comment: Performed at [facility], [address] [place], [place] [address]  Ferritin     Status: Abnormal   Collection Time: 11/12/19  2:30 PM  Result Value Ref Range   Ferritin 831 (H) 24 - 336 ng/mL    Comment: Performed at [facility], [address] [place], [place] [address]  Iron and TIBC     Status: Abnormal   Collection Time: 11/12/19  2:30 PM  Result Value Ref Range   Iron 145 45 - 182 ug/dL   TIBC 239 (L) 250 - 450 ug/dL   Saturation Ratios 61 (H) 17.9 - 39.5 %   UIBC 94 ug/dL    Comment: Performed at [facility], [address] [place], [place] [address]  Lactate  dehydrogenase     Status: None   Collection Time: 11/12/19  2:30 PM  Result Value Ref Range   LDH 130 98 - 192 U/L    Comment: Performed at [facility], [address] [place], [place] [address]  Vitamin B12     Status: None   Collection Time: 11/12/19  2:30 PM  Result Value Ref Range   Vitamin B-12 658 180 - 914 pg/mL    Comment: (NOTE) This assay is not validated for testing neonatal or myeloproliferative syndrome specimens for Vitamin B12 levels. Performed at [facility], [address], [place], [place] [address]   VITAMIN D 25 Hydroxy (Vit-D Deficiency, Fractures)     Status: Abnormal   Collection Time: 11/12/19  2:30 PM  Result Value Ref Range   Vit D, 25-Hydroxy 13.31 (L) 30 - 100 ng/mL    Comment: (NOTE) Vitamin D deficiency has been defined by the Institute of Medicine  and an Endocrine Society practice guideline as a level of serum 25-OH  vitamin D less than 20 ng/mL (1,2). The Endocrine Society went on to  further define vitamin D insufficiency as a level between 21 and 29  ng/mL (2).  1. IOM (Institute of Medicine). 2010. Dietary reference intakes for  calcium and D. [place]: The [employer]. 2. Holick MF, Binkley [place], Bischoff-Ferrari HA, et al. Evaluation,  treatment, and prevention of vitamin D deficiency: an Endocrine  Society clinical practice guideline, JCEM. 2011 Jul; 96(7): 1911-30.  Performed at [facility], [place] [address] [place], [place] [address]   Folate     Status: None   Collection Time: 11/12/19  2:30 PM  Result Value Ref Range   Folate 12.0 >5.9 ng/mL    Comment: Performed at [facility], [address], [place], [place] [address]  Hepatitis B core antibody, IgM     Status: None   Collection Time: 11/12/19  2:30 PM  Result Value Ref Range   Hep B C IgM NON REACTIVE NON REACTIVE    Comment: Performed at [facility], [place] [address], [place], [place] [address]  Hepatitis B surface antibody,qualitative     Status:  None   Collection Time: 11/12/19  2:30 PM  Result Value Ref Range   Hep B S Ab NON REACTIVE NON REACTIVE    Comment: (NOTE) Inconsistent with immunity, less than 10 mIU/mL.  Performed at [facility], [place] [address] [place], [place] [address]   Hepatitis B surface antigen     Status: None   Collection Time: 11/12/19  2:30 PM  Result Value Ref Range   Hepatitis B Surface Ag NON REACTIVE NON REACTIVE    Comment: Performed at [facility] [address] [place], [place] [address]  C-reactive protein     Status: None   Collection Time: 11/12/19  2:30 PM  Result Value Ref Range   CRP 0.6 <1.0 mg/dL    Comment: Performed at [facility], [address] [place], [place] [address]  Sedimentation rate     Status: Abnormal   Collection Time: 11/12/19  2:30 PM  Result Value Ref Range   Sed Rate 28 (H) 0 - 16 mm/hr    Comment: Performed at [facility], [address], [place], [place] [address]    RADIOGRAPHIC STUDIES: I have personally reviewed the radiological images as listed and agreed with the findings in the report. VAS [place] ABI WITH/WO TBI  Result Date: 10/28/2019 LOWER EXTREMITY DOPPLER STUDY Indications: Claudication, peripheral artery disease, and Patient uses cane to              mobilize around. He doesn't walk to much and notices if he exerts              himself both legs will start to ache. Other Factors: SEE AORTOILIAC DUPLEX REPORT.  Vascular Interventions: 08/09/2015 PTA and stent placed in right EIA.                          Patient has known bilateral SFA occlusions with                         inability to treat. Comparison Study: Prior ABI 10/25/2018 Right .60 Left .77 Performing Technologist: Salvadore Dom RVT  Examination Guidelines: A complete evaluation includes at minimum, Doppler waveform signals and systolic blood pressure reading at the level of bilateral brachial, anterior tibial, and posterior tibial  arteries, when vessel segments are accessible.  Bilateral testing is considered an integral part of a complete examination. Photoelectric Plethysmograph (PPG) waveforms and toe systolic pressure readings are included as required and additional duplex testing as needed. Limited examinations for reoccurring indications may be performed as noted.  ABI Findings: +---------+------------------+-----+----------+--------+ Right    Rt Pressure (mmHg)IndexWaveform  Comment  +---------+------------------+-----+----------+--------+ Brachial 141                                       +---------+------------------+-----+----------+--------+ ATA      109               0.69 monophasic         +---------+------------------+-----+----------+--------+ PTA      128               0.82 monophasic         +---------+------------------+-----+----------+--------+ PERO     73                0.46 monophasic         +---------+------------------+-----+----------+--------+ Great Toe64                0.41 Abnormal           +---------+------------------+-----+----------+--------+ +---------+------------------+-----+----------+-------+ Left     Lt Pressure (mmHg)IndexWaveform  Comment +---------+------------------+-----+----------+-------+ Brachial 157                                      +---------+------------------+-----+----------+-------+ ATA      78                0.50 monophasic        +---------+------------------+-----+----------+-------+ PTA      90                0.57 monophasic        +---------+------------------+-----+----------+-------+ PERO                            absent            +---------+------------------+-----+----------+-------+ Great Toe53                0.34 Abnormal          +---------+------------------+-----+----------+-------+ +-------+-----------+-----------+------------+------------+ ABI/TBIToday's ABIToday's TBIPrevious ABIPrevious TBI  +-------+-----------+-----------+------------+------------+ Right  .82        .41        .60         .44          +-------+-----------+-----------+------------+------------+ Left   .57        .34        .68         .33          +-------+-----------+-----------+------------+------------+ Right ABIs appear increased compared to prior study on 10/25/2018. Left ABIs and TBIs appear essentially unchanged compared to prior study on 10/25/2018.  Summary: Right: Resting right ankle-brachial index indicates mild right lower extremity arterial disease. The right toe-brachial index is abnormal. Left: Resting left ankle-brachial index indicates moderate left lower extremity arterial disease. The left toe-brachial index is abnormal.  *See table(s) above for measurements and observations.  Suggest follow up study in 12 months. Electronically signed by Larae Grooms MD on 10/28/2019 at 12:05:33 AM.    Final    VAS US AORTA/IVC/ILIACS  Result  Date: 10/28/2019 ABDOMINAL AORTA STUDY Indications: Patient uses cane to mobilize around. He doesn't walk to much and              notices if he exerts himself both legs will start to ache Risk Factors: Hypertension, hyperlipidemia, past history of smoking. Other Factors: SEE ABI REPORT                 Today's ABI                Right .82                Left .57. Vascular Interventions: 08/09/2015 PTA and stent placed in right EIA.                          Patient has known bilateral SFA occlusions with                         inability to treat. Limitations: Air/bowel gas and obesity.  Comparison Study: 10/25/2018 Aortoiliac duplex showed highest velocities in right                   CIA 154 cm/s, right EIA distal stent 231 cm/s. Left CIA 169                   cm/s, left distal EIA 343 cm/s. Performing Technologist: Salvadore Dom RVT, RDCS (AE), RDMS  Examination Guidelines: A complete evaluation includes B-mode imaging, spectral Doppler, color Doppler, and power Doppler as  needed of all accessible portions of each vessel. Bilateral testing is considered an integral part of a complete examination. Limited examinations for reoccurring indications may be performed as noted.  Abdominal Aorta Findings: +-------------+-------+----------+---------+----------+--------+---------------+ Location     AP (cm)Trans (cm)PSV      Waveform  ThrombusComments                                      (cm/s)                                     +-------------+-------+----------+---------+----------+--------+---------------+ Proximal     1.80   2.10      69       monophasic                        +-------------+-------+----------+---------+----------+--------+---------------+ Distal       1.60             71       monophasic                        +-------------+-------+----------+---------+----------+--------+---------------+ RT CIA Prox                   57       monophasic                        +-------------+-------+----------+---------+----------+--------+---------------+ RT CIA Mid                    65       monophasic                        +-------------+-------+----------+---------+----------+--------+---------------+  RT CIA Distal                 120      monophasic                        +-------------+-------+----------+---------+----------+--------+---------------+ RT EIA Prox                   139      monophasic        area of stent   +-------------+-------+----------+---------+----------+--------+---------------+ RT EIA Mid                    184      monophasic        area of stent   +-------------+-------+----------+---------+----------+--------+---------------+ RT EIA Distal                 174      monophasic        area of stent   +-------------+-------+----------+---------+----------+--------+---------------+ LT CIA Prox                   66       monophasic                         +-------------+-------+----------+---------+----------+--------+---------------+ LT CIA Mid                    91       monophasic                        +-------------+-------+----------+---------+----------+--------+---------------+ LT CIA Distal                 132      monophasic                        +-------------+-------+----------+---------+----------+--------+---------------+ LT EIA Prox                   169      monophasic                        +-------------+-------+----------+---------+----------+--------+---------------+ LT EIA Mid                    256      monophasic        > 50 % stenosis +-------------+-------+----------+---------+----------+--------+---------------+ LT EIA Distal                 276      monophasic        > 50 % stenosis +-------------+-------+----------+---------+----------+--------+---------------+ RT PRX CFA 153 cm/s LT PRX CFA 217 cm/s= >50 % stenosis. IVC/Iliac Findings: +--------+------+--------+--------+   IVC   PatentThrombusComments +--------+------+--------+--------+ IVC Proxpatent                 +--------+------+--------+--------+    Right Stent(s): +---+--------+--------+--------+--------+ EIAPSV cm/sStenosisWaveformComments +---+--------+--------+--------+--------+ stent struts not seen, refer to native aorta worksheet.   Summary: Abdominal Aorta: No evidence of an abdominal aortic aneurysm was visualized. The largest aortic measurement is 2.1 cm. Stenosis: +--------------------+-------------+--------------+---------------------------+ Location            Stenosis     Stent         Comments                    +--------------------+-------------+--------------+---------------------------+ Right External Iliac  1-49% stenosisstent struts not well seen. +--------------------+-------------+--------------+---------------------------+ Left External Iliac >50% stenosis                                           +--------------------+-------------+--------------+---------------------------+ Heavy atheroscelosis noted throughout aorta and iliac arteries. Left proximal CFA >50 % stenosis. IVC/Iliac: There is no evidence of thrombus involving the IVC. Unable to duplicate highest arterial velocities noted in prior exam, otherwise stable exam from last year.  *See table(s) above for measurements and observations. Suggest follow up study in 12 months.  Electronically signed by Larae Grooms MD on 10/28/2019 at 12:07:29 AM.    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 -EMR evaluation shows hemochromatosis testing on 01/27/2019 which showed heterozygosity for C282Y and H63D variants. -Given the elevated ferritin levels above 1000 and the results of genetic testing, this is compatible with hereditary hemochromatosis. -Patient does have arthritis of the small joints of the hands and arthritis of the back. -Denies any history of congestive heart failure but has CAD.  No history of diabetes.  No pigmentation of the skin. -I have recommended repeating ferritin and iron panel today.  We will also check MRI of the liver with and without contrast for hepatic iron overload. -His previous labs indicate that he is anemic.  He might not be a candidate for therapeutic phlebotomy. -Alternatives include iron chelation therapy with Exjade.  I will see him back after the imaging.  2.  Macrocytic anemia: -We will repeat CBC.  We will check for O17, folic acid levels. -Colonoscopy by Dr. Loletha Carrow on 02/17/2019 shows diverticulosis in the left colon, 12 mm polyp in the cecum, 4 to 6 mm polyp in the ascending colon and 5 mm polyp in the descending colon.  Pathology was consistent with tubular adenomas.   All questions were answered. The patient knows to call the clinic with any problems, questions or concerns.      Derek Jack, MD 11/12/19  7:22 PM

## 2019-11-13 LAB — RHEUMATOID FACTOR: Rheumatoid fact SerPl-aCnc: <10 IU/mL (ref 0.0–13.9)

## 2019-11-14 LAB — METHYLMALONIC ACID, SERUM: Methylmalonic Acid, Quantitative: 132 nmol/L (ref 0–378)

## 2019-11-18 ENCOUNTER — Other Ambulatory Visit (HOSPITAL_COMMUNITY): Payer: Self-pay | Admitting: *Deleted

## 2019-11-26 ENCOUNTER — Ambulatory Visit (HOSPITAL_COMMUNITY): Payer: Medicare PPO

## 2019-11-29 ENCOUNTER — Other Ambulatory Visit: Payer: Self-pay | Admitting: Internal Medicine

## 2019-12-02 ENCOUNTER — Ambulatory Visit (INDEPENDENT_AMBULATORY_CARE_PROVIDER_SITE_OTHER): Payer: Medicare PPO | Admitting: Internal Medicine

## 2019-12-02 ENCOUNTER — Encounter: Payer: Self-pay | Admitting: Internal Medicine

## 2019-12-02 ENCOUNTER — Other Ambulatory Visit: Payer: Self-pay

## 2019-12-02 DIAGNOSIS — M159 Polyosteoarthritis, unspecified: Secondary | ICD-10-CM | POA: Diagnosis not present

## 2019-12-02 DIAGNOSIS — Z7189 Other specified counseling: Secondary | ICD-10-CM

## 2019-12-02 DIAGNOSIS — I6523 Occlusion and stenosis of bilateral carotid arteries: Secondary | ICD-10-CM

## 2019-12-02 DIAGNOSIS — Z0000 Encounter for general adult medical examination without abnormal findings: Secondary | ICD-10-CM | POA: Diagnosis not present

## 2019-12-02 DIAGNOSIS — K219 Gastro-esophageal reflux disease without esophagitis: Secondary | ICD-10-CM

## 2019-12-02 DIAGNOSIS — I739 Peripheral vascular disease, unspecified: Secondary | ICD-10-CM

## 2019-12-02 MED ORDER — TRAMADOL HCL 50 MG PO TABS: 50.0000 mg | ORAL_TABLET | Freq: Three times a day (TID) | ORAL | 0 refills | Status: DC | PRN

## 2019-12-02 NOTE — Progress Notes (Signed)
Subjective:    Patient ID: Aaron Sexton, male    DOB: 22-Oct-1945, 74 y.o.   MRN: [id number]  HPI Here for Medicare wellness visit and follow up of chronic health conditions This visit occurred during the SARS-CoV-2 public health emergency.  Safety protocols were in place, including screening questions prior to the visit, additional usage of staff PPE, and extensive cleaning of exam room while observing appropriate contact time as indicated for disinfecting solutions.   Reviewed advanced directives Reviewed other doctors--- Dr Sherin Quarry, Dr Danis--GI, Dr Branch--cardiology, Dr Nada Boozer, Dental clinic at [facility] No hospitalizations or surgery this year No alcohol or tobacco Not really able to exercise Vision is fine No hearing problems No depression or anhedonia No falls Does do house and yard work No memory problems  Had colonoscopy Multiple polyps including 12mm one No follow up needed  Saw hematologist---high ferritin with anemia Has follow up planned  Continues with Dr Nada Boozer Still with bilateral claudication---intermittent Not really exercising--will walk till pain No chest pain No SOB No dizziness or syncope No edema No focal weakness, aphasia, facial droop, etc  Still with arthritic hands Tramadol does help---uses 2-3 per day  Current Outpatient Medications on File Prior to Visit  Medication Sig Dispense Refill  . aspirin 81 MG tablet Take 81 mg by mouth daily.      . chlorthalidone (HYGROTON) 25 MG tablet TAKE 1/2 TABLET(12.5 MG) BY MOUTH DAILY 45 tablet 3  . cimetidine (TAGAMET) 400 MG tablet Take 400 mg by mouth 2 (two) times daily as needed (indigestion).     . clopidogrel (PLAVIX) 75 MG tablet TAKE 1 TABLET(75 MG) BY MOUTH DAILY WITH BREAKFAST 30 tablet 11  . Cyanocobalamin (VITAMIN B-12) 500 MCG SUBL Place 500 mcg under the tongue daily.    [name] losartan (COZAAR) 25 MG tablet TAKE ONE TABLET BY MOUTH EVERY DAY 90 tablet 3  . metoprolol  succinate (TOPROL-XL) 100 MG 24 hr tablet Take 1 tablet (100 mg total) by mouth daily. Take with or immediately following a meal. 90 tablet 3  . nitroGLYCERIN (NITROSTAT) 0.4 MG SL tablet Place 1 tablet (0.4 mg total) under the tongue every 5 (five) minutes as needed for chest pain. 25 tablet 3  . Omega-3 Fatty Acids (FISH OIL PO) Take 1,000 mg by mouth daily.     . pravastatin (PRAVACHOL) 40 MG tablet Take 1 tablet (40 mg total) by mouth daily. 90 tablet 3  . Probiotic Product (PROBIOTIC PO) Take by mouth.    . traMADol (ULTRAM) 50 MG tablet TAKE 1 TABLET(50 MG) BY MOUTH EVERY 8 HOURS AS NEEDED FOR PAIN 60 tablet 0   No current facility-administered medications on file prior to visit.    Allergies  Allergen Reactions  . Diltiazem Hcl Hives and Rash  . Cardizem [Diltiazem Hcl]     Past Medical History:  Diagnosis Date  . Anemia   . Arteriosclerotic cardiovascular disease (ASCVD) 1993   Critical RCA disease in 1993 treated with PTCA  . Cerebrovascular disease    Moderate ASVD without focal stenosis in 01/2007  . Colon polyps 3/12   single 2mm polyp--tubular adenoma  . Diverticulosis 2012   found on colonoscopy  . Erectile dysfunction   . Family history of adverse reaction to anesthesia    difficult for son & sistor to wake   . GERD (gastroesophageal reflux disease)   . Hematochezia 05/24/2010  . Hyperlipidemia   . Osteoarthritis    knees/hands-Dr Sherlean Foot  . Overweight(278.02)   .  Peripheral vascular disease (HCC) 03/11/2010   Moderate SFA stenosis; history of claudication  . Pneumonia   . Rosacea   . Tobacco abuse, in remission    30-40 pack years discontinued in 1993  . Vitamin B12 deficiency     Past Surgical History:  Procedure Laterality Date  . APPENDECTOMY    . COLONOSCOPY W/ POLYPECTOMY  2012  . ILIAC VEIN ANGIOPLASTY / STENTING Right 08/09/2015  . PERIPHERAL VASCULAR CATHETERIZATION Bilateral 06/21/2015   Procedure: Lower Extremity Angiography;  Surgeon: Runell Gess, MD;  Location: [facility] INVASIVE CV LAB;  Service: Cardiovascular;  Laterality: Bilateral;  . PERIPHERAL VASCULAR CATHETERIZATION N/A 06/21/2015   Procedure: Abdominal Aortogram;  Surgeon: Runell Gess, MD;  Location: [facility] INVASIVE CV LAB;  Service: Cardiovascular;  Laterality: N/A;  . PERIPHERAL VASCULAR CATHETERIZATION N/A 07/26/2015   Procedure: Lower Extremity Angiography;  Surgeon: Runell Gess, MD;  Location: [facility] INVASIVE CV LAB;  Service: Cardiovascular;  Laterality: N/A;  . PERIPHERAL VASCULAR CATHETERIZATION N/A 08/09/2015   Procedure: Lower Extremity Angiography;  Surgeon: Runell Gess, MD;  Location: [facility] INVASIVE CV LAB;  Service: Cardiovascular;  Laterality: N/A;  . PERIPHERAL VASCULAR CATHETERIZATION  08/09/2015   Procedure: Peripheral Vascular Intervention;  Surgeon: Runell Gess, MD;  Location: [facility] INVASIVE CV LAB;  Service: Cardiovascular;;  rt ext. iliac atherectomy and stent  . PERIPHERAL VASCULAR CATHETERIZATION N/A 09/20/2015   Procedure: Lower Extremity Angiography;  Surgeon: Runell Gess, MD;  Location: [facility] INVASIVE CV LAB;  Service: Cardiovascular;  Laterality: N/A;  . PERIPHERAL VASCULAR CATHETERIZATION Right 09/20/2015   Procedure: Peripheral Vascular Intervention;  Surgeon: Runell Gess, MD;  Location: [facility] INVASIVE CV LAB;  Service: Cardiovascular;  Laterality: Right;  SFA  . ROTATOR CUFF REPAIR Left 7/15   Dr Sherlean Foot    Family History  Problem Relation Age of Onset  . Hypertension Father        And siblings  . Heart disease Father        And second-degree relatives  . Transient ischemic attack Father   . Lung cancer Mother   . Diabetes Mother   . Brain cancer Sister   . Diabetes Brother   . Atrial fibrillation Brother   . Heart disease Sister   . Heart disease Sister   . Thyroid disease Sister   . Ulcers Son   . Diabetes Son   . Diabetes Son     Social History   Socioeconomic History  . Marital status: Married    Spouse name: Not on file  .  Number of children: 3  . Years of education: Not on file  . Highest education level: Not on file  Occupational History  . 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 Smoking status: Former Smoker    Packs/day: 1.00    Years: 30.00    Pack years: 30.00    Quit date: 05/16/1990    Years since quitting: 29.5  . Smokeless tobacco: Never Used  . Tobacco comment: Quit in 1993  Vaping Use  . Vaping Use: Never used  Substance and Sexual Activity  . Alcohol use: No    Alcohol/week: 0.0 standard drinks  . Drug use: No  . Sexual activity: Not on file  Other Topics Concern  . Not on file  Social History Narrative   Has living will   Wife is health care POA---alternate is son Algernon Huxley   Would accept resuscitation but  no prolonged artificial ventilation.   No tube feeds if cognitively unaware   Social Determinants of Health   Financial Resource Strain:   . Difficulty of Paying Living Expenses:   Food Insecurity:   . Worried About Programme researcher, broadcasting/film/video in the Last Year:   . Barista in the Last Year:   Transportation Needs:   . Freight forwarder (Medical):   [name] Lack of Transportation (Non-Medical):   Physical Activity:   . Days of Exercise per Week:   . Minutes of Exercise per Session:   Stress:   . Feeling of Stress :   Social Connections:   . Frequency of Communication with Friends and Family:   . Frequency of Social Gatherings with Friends and Family:   . Attends Religious Services:   . Active Member of Clubs or Organizations:   . Attends Banker Meetings:   [name] Marital Status:   Intimate Partner Violence:   . Fear of Current or Ex-Partner:   . Emotionally Abused:   [name] Physically Abused:   . Sexually Abused:    Review of Systems Appetite is good Weight is stable Sleeps okay Wears seat belt Keeps up with dental clinic No skin problems No heartburn or dysphagia Bowels fine--no blood       Objective:   Physical Exam Constitutional:      Appearance: Normal appearance.  HENT:     Head: Normocephalic and atraumatic.     Mouth/Throat:     Mouth: Mucous membranes are moist.     Comments: No lesions Eyes:     Conjunctiva/sclera: Conjunctivae normal.     Pupils: Pupils are equal, round, and reactive to light.  Cardiovascular:     Rate and Rhythm: Normal rate and regular rhythm.     Heart sounds: No murmur heard.  No gallop.      Comments: Faint pulse in left foot--absent in right Pulmonary:     Effort: Pulmonary effort is normal.     Breath sounds: Normal breath sounds. No wheezing or rales.  Abdominal:     Palpations: Abdomen is soft.     Tenderness: There is no abdominal tenderness.  Musculoskeletal:     Cervical back: Neck supple.     Right lower leg: No edema.     Left lower leg: No edema.  Lymphadenopathy:     Cervical: No cervical adenopathy.  Skin:    General: Skin is warm.     Findings: No rash.  Neurological:     Mental Status: He is alert and oriented to person, place, and time.     Comments: President---"Biden, Trump, Obama" 100-92-87 [id number] Recall 3/3  Psychiatric:        Mood and Affect: Mood normal.        Behavior: Behavior normal.            Assessment & Plan:

## 2019-12-02 NOTE — Progress Notes (Signed)
Hearing Screening   Method: Audiometry   125Hz  250Hz  500Hz  1000Hz  2000Hz  3000Hz  4000Hz  6000Hz  8000Hz   Right ear:   20 20 20  20     Left ear:   20 20 20  20     Vision Screening Comments: Sees Dr Marvel Plan Every 2 years. Has appt in the Fall.

## 2019-12-02 NOTE — Assessment & Plan Note (Signed)
Worst in hands--but generalized Gets by with the tramadol

## 2019-12-02 NOTE — Assessment & Plan Note (Signed)
No TIA symptoms On statin, ASA

## 2019-12-02 NOTE — Assessment & Plan Note (Signed)
Uses the cimetidine prn only

## 2019-12-02 NOTE — Assessment & Plan Note (Signed)
I have personally reviewed the Medicare Annual Wellness questionnaire and have noted 1. The patient's medical and social history 2. Their use of alcohol, tobacco or illicit drugs 3. Their current medications and supplements 4. The patient's functional ability including ADL's, fall risks, home safety risks and hearing or visual             impairment. 5. Diet and physical activities 6. Evidence for depression or mood disorders  The patients weight, height, BMI and visual acuity have been recorded in the chart I have made referrals, counseling and provided education to the patient based review of the above and I have provided the pt with a written personalized care plan for preventive services.  I have provided you with a copy of your personalized plan for preventive services. Please take the time to review along with your updated medication list.  Done with colonoscopies No PSA due to age Discussed exercise Flu vaccine in the fall Td if any injury Consider shingrix

## 2019-12-02 NOTE — Assessment & Plan Note (Signed)
See social history 

## 2019-12-02 NOTE — Assessment & Plan Note (Signed)
Continues on statin and ASA 

## 2019-12-03 ENCOUNTER — Ambulatory Visit (HOSPITAL_COMMUNITY): Payer: Medicare PPO | Admitting: Hematology

## 2019-12-11 ENCOUNTER — Other Ambulatory Visit: Payer: Self-pay

## 2019-12-11 ENCOUNTER — Ambulatory Visit (HOSPITAL_COMMUNITY)
Admission: RE | Admit: 2019-12-11 | Discharge: 2019-12-11 | Disposition: A | Discharge status: Home / Self Care | Payer: Medicare PPO | Source: Ambulatory Visit | Attending: Hematology | Admitting: Hematology

## 2019-12-11 ENCOUNTER — Ambulatory Visit (HOSPITAL_COMMUNITY)
Admission: RE | Admit: 2019-12-11 | Discharge: 2019-12-11 | Disposition: A | Discharge status: Home / Self Care | Payer: Medicare PPO | Source: Ambulatory Visit | Attending: Nurse Practitioner | Admitting: Nurse Practitioner

## 2019-12-11 DIAGNOSIS — R7989 Other specified abnormal findings of blood chemistry: Secondary | ICD-10-CM | POA: Diagnosis not present

## 2019-12-11 DIAGNOSIS — Z01818 Encounter for other preprocedural examination: Secondary | ICD-10-CM | POA: Diagnosis not present

## 2019-12-11 DIAGNOSIS — I700 Atherosclerosis of aorta: Secondary | ICD-10-CM | POA: Diagnosis not present

## 2019-12-11 DIAGNOSIS — M47816 Spondylosis without myelopathy or radiculopathy, lumbar region: Secondary | ICD-10-CM | POA: Diagnosis not present

## 2019-12-11 MED ORDER — GADOBUTROL 1 MMOL/ML IV SOLN
10.0000 mL | Freq: Once | INTRAVENOUS | Status: AC | PRN
  Administered 2019-12-11: 10 mL via INTRAVENOUS

## 2019-12-15 ENCOUNTER — Inpatient Hospital Stay (HOSPITAL_COMMUNITY): Payer: Medicare PPO | Attending: Hematology | Admitting: Hematology

## 2019-12-15 ENCOUNTER — Other Ambulatory Visit: Payer: Self-pay

## 2019-12-15 DIAGNOSIS — D509 Iron deficiency anemia, unspecified: Secondary | ICD-10-CM | POA: Diagnosis not present

## 2019-12-15 DIAGNOSIS — Z79899 Other long term (current) drug therapy: Secondary | ICD-10-CM | POA: Insufficient documentation

## 2019-12-15 DIAGNOSIS — I2510 Atherosclerotic heart disease of native coronary artery without angina pectoris: Secondary | ICD-10-CM | POA: Insufficient documentation

## 2019-12-15 DIAGNOSIS — Z800 Family history of malignant neoplasm of digestive organs: Secondary | ICD-10-CM | POA: Insufficient documentation

## 2019-12-15 DIAGNOSIS — Z86010 Personal history of colonic polyps: Secondary | ICD-10-CM | POA: Diagnosis not present

## 2019-12-15 DIAGNOSIS — N529 Male erectile dysfunction, unspecified: Secondary | ICD-10-CM | POA: Diagnosis not present

## 2019-12-15 DIAGNOSIS — E785 Hyperlipidemia, unspecified: Secondary | ICD-10-CM | POA: Insufficient documentation

## 2019-12-15 DIAGNOSIS — Z801 Family history of malignant neoplasm of trachea, bronchus and lung: Secondary | ICD-10-CM | POA: Diagnosis not present

## 2019-12-15 DIAGNOSIS — Z7982 Long term (current) use of aspirin: Secondary | ICD-10-CM | POA: Insufficient documentation

## 2019-12-15 DIAGNOSIS — E538 Deficiency of other specified B group vitamins: Secondary | ICD-10-CM | POA: Insufficient documentation

## 2019-12-15 DIAGNOSIS — R5383 Other fatigue: Secondary | ICD-10-CM | POA: Insufficient documentation

## 2019-12-15 DIAGNOSIS — E559 Vitamin D deficiency, unspecified: Secondary | ICD-10-CM | POA: Diagnosis not present

## 2019-12-15 DIAGNOSIS — M1990 Unspecified osteoarthritis, unspecified site: Secondary | ICD-10-CM | POA: Diagnosis not present

## 2019-12-15 DIAGNOSIS — I739 Peripheral vascular disease, unspecified: Secondary | ICD-10-CM | POA: Insufficient documentation

## 2019-12-15 DIAGNOSIS — M129 Arthropathy, unspecified: Secondary | ICD-10-CM | POA: Diagnosis not present

## 2019-12-15 DIAGNOSIS — Z87891 Personal history of nicotine dependence: Secondary | ICD-10-CM | POA: Diagnosis not present

## 2019-12-15 MED ORDER — ERGOCALCIFEROL 1.25 MG (50000 UT) PO CAPS: 50000.0000 [IU] | ORAL_CAPSULE | ORAL | 2 refills | Status: DC

## 2019-12-15 MED ORDER — DEFERASIROX 180 MG PO TABS: 540.0000 mg | ORAL_TABLET | Freq: Every day | ORAL | 0 refills | Status: DC

## 2019-12-15 NOTE — Patient Instructions (Signed)
[place] at [facility] Discharge Instructions  You were seen today by Dr. Delton Coombes. He went over your recent results. You will be prescribed vitamin D to take once weekly. Do not take vitamin C or iron supplements and do not eat raw seafood. Purchase Imodium over the counter to take as needed for your diarrhea. Since you are not a candidate for phlebotomy, you will be prescribed Exjade (deferasirox) 540 mg to decrease the iron level in your blood; take it with a light meal or on an empty stomach. Dr. Delton Coombes will see you back in 3 weeks for labs and follow up.   Thank you for choosing [place] at [facility] to provide your oncology and hematology care.  To afford each patient quality time with our provider, please arrive at least 15 minutes before your scheduled appointment time.   If you have a lab appointment with the [facility] please come in thru the Main Entrance and check in at the main information desk  You need to re-schedule your appointment should you arrive 10 or more minutes late.  We strive to give you quality time with our providers, and arriving late affects you and other patients whose appointments are after yours.  Also, if you no show three or more times for appointments you may be dismissed from the clinic at the providers discretion.     Again, thank you for choosing [facility].  Our hope is that these requests will decrease the amount of time that you wait before being seen by our physicians.       _____________________________________________________________  Should you have questions after your visit to [facility], please contact our office at [phone] between the hours of 8:00 a.m. and 4:30 p.m.  Voicemails left after 4:00 p.m. will not be returned until the following business day.  For prescription refill requests, have your pharmacy contact our office and allow 72 hours.    Cancer  Center Support Programs:   > Cancer Support Group  2nd Tuesday of the month 1pm-2pm, Journey Room

## 2019-12-15 NOTE — Progress Notes (Signed)
[address], [place] [address]   CLINIC:  Medical Oncology/Hematology  PCP:  Venia Carbon, MD [place] / [place] [address]  [phone]  REASON FOR VISIT:  Follow-up for hereditary hemochromatosis  PRIOR THERAPY: None  CURRENT THERAPY: Under work-up  INTERVAL HISTORY:  [name], a 74 y.o. 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 Aaron Sexton was last seen on 11/12/2019.  Today he is accompanied by his wife. He denies any new issues his the last office visit and denies any new pains or recent infections. He has arthritis in his hands, wrists, back, and knees. He has 1 loose BM daily and takes probiotics for it daily.   REVIEW OF SYSTEMS:  Review of Systems  Constitutional: Positive for fatigue (moderate). Negative for appetite change.  Musculoskeletal: Negative for arthralgias and myalgias.  Neurological: Positive for numbness (fingers).  All other systems reviewed and are negative.   PAST MEDICAL/SURGICAL HISTORY:  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   Cerebrovascular disease    Moderate ASVD without focal stenosis in 01/2007   Colon polyps 3/12   single 36mm polyp--tubular adenoma   Diverticulosis 2012   found on colonoscopy   Erectile dysfunction    Family history of adverse reaction to anesthesia    difficult for son & sistor to wake    GERD (gastroesophageal reflux disease)    Hematochezia 05/24/2010   Hyperlipidemia    Osteoarthritis    knees/hands-Dr Lucey   Overweight(278.02)    Peripheral vascular disease ([place]) 03/11/2010   Moderate SFA stenosis; history of claudication   Pneumonia    Rosacea    Tobacco abuse, in remission    30-40 pack years discontinued in 1993   Vitamin B12 deficiency    Past Surgical History:  Procedure  Laterality Date   APPENDECTOMY     COLONOSCOPY W/ POLYPECTOMY  2012   [place] / STENTING Right 08/09/2015   PERIPHERAL VASCULAR CATHETERIZATION Bilateral 06/21/2015   Procedure: Lower Extremity Angiography;  Surgeon: Lorretta Harp, MD;  Location: [facility];  Service: Cardiovascular;  Laterality: Bilateral;   PERIPHERAL VASCULAR CATHETERIZATION N/A 06/21/2015   Procedure: Abdominal Aortogram;  Surgeon: Lorretta Harp, MD;  Location: [facility];  Service: Cardiovascular;  Laterality: N/A;   PERIPHERAL VASCULAR CATHETERIZATION N/A 07/26/2015   Procedure: Lower Extremity Angiography;  Surgeon: Lorretta Harp, MD;  Location: [facility];  Service: Cardiovascular;  Laterality: N/A;   PERIPHERAL VASCULAR CATHETERIZATION N/A 08/09/2015   Procedure: Lower Extremity Angiography;  Surgeon: Lorretta Harp, MD;  Location: [facility];  Service: Cardiovascular;  Laterality: N/A;   PERIPHERAL VASCULAR CATHETERIZATION  08/09/2015   Procedure: Peripheral Vascular Intervention;  Surgeon: Lorretta Harp, MD;  Location: [facility];  Service: Cardiovascular;;  rt ext. iliac atherectomy and stent   PERIPHERAL VASCULAR CATHETERIZATION N/A 09/20/2015   Procedure: Lower Extremity Angiography;  Surgeon: Lorretta Harp, MD;  Location: [facility];  Service: Cardiovascular;  Laterality: N/A;   PERIPHERAL VASCULAR CATHETERIZATION Right 09/20/2015   Procedure: Peripheral Vascular Intervention;  Surgeon: Lorretta Harp, MD;  Location: [facility];  Service: Cardiovascular;  Laterality: Right;  SFA   ROTATOR CUFF REPAIR Left 7/15   Dr Ronnie Derby    SOCIAL HISTORY:  Social History   Socioeconomic History   Marital status: Married    Spouse name: Not on file   Number of children: 3   Years of education: Not on file   Highest education level: Not on file  Occupational History   Occupation: Managed supply chain--- retired 2013     Comment: [employer]  Tobacco Use   Smoking status: Former Smoker    Packs/day: 1.00    Years: 30.00    Pack years: 30.00    Quit date: 05/16/1990    Years since quitting: 29.6   Smokeless tobacco: Never Used   Tobacco comment: Quit in 1993  Vaping Use   Vaping Use: Never used  Substance and Sexual Activity   Alcohol use: No    Alcohol/week: 0.0 standard drinks   Drug use: No   Sexual activity: Not on file  Other Topics Concern   Not on file  Social History Narrative   Has living will   Wife is health care POA---alternate is son Monica Martinez   Would accept resuscitation but no prolonged artificial ventilation.   No tube feeds if cognitively unaware   Social Determinants of Health   Financial Resource Strain:    Difficulty of Paying Living Expenses:   Food Insecurity:    Worried About Charity fundraiser in the Last Year:    Arboriculturist in the Last Year:   Transportation Needs:    Film/video editor (Medical):    Lack of Transportation (Non-Medical):   Physical Activity:    Days of Exercise per Week:    Minutes of Exercise per Session:   Stress:    Feeling of Stress :   Social Connections:    Frequency of Communication with Friends and Family:    Frequency of Social Gatherings with Friends and Family:    Attends Religious Services:    Active Member of Clubs or Organizations:    Attends Music therapist:    Marital Status:   Intimate Partner Violence:    Fear of Current or Ex-Partner:    Emotionally Abused:    Physically Abused:    Sexually Abused:     FAMILY HISTORY:  Family History  Problem Relation Age of Onset   Hypertension Father        And siblings   Heart disease Father        And second-degree relatives   Transient ischemic attack Father    Lung cancer Mother    Diabetes Mother    Brain cancer Sister    Diabetes Brother    Atrial fibrillation Brother    Heart disease Sister     Heart disease Sister    Thyroid disease Sister    Ulcers Son    Diabetes Son    Diabetes Son     CURRENT MEDICATIONS:  Current Outpatient Medications  Medication Sig Dispense Refill   aspirin 81 MG tablet Take 81 mg by mouth daily.       chlorthalidone (HYGROTON) 25 MG tablet TAKE 1/2 TABLET(12.5 MG) BY MOUTH DAILY 45 tablet 3   cimetidine (TAGAMET) 400 MG tablet Take 400 mg by mouth 2 (two) times daily as needed (indigestion).      clopidogrel (PLAVIX) 75 MG tablet TAKE 1 TABLET(75 MG) BY MOUTH DAILY WITH BREAKFAST 30 tablet 11   Cyanocobalamin (VITAMIN B-12) 500 MCG SUBL Place 500 mcg under the tongue daily.     losartan (COZAAR) 25 MG tablet TAKE ONE TABLET BY MOUTH  EVERY DAY 90 tablet 3   metoprolol succinate (TOPROL-XL) 100 MG 24 hr tablet Take 1 tablet (100 mg total) by mouth daily. Take with or immediately following a meal. 90 tablet 3   nitroGLYCERIN (NITROSTAT) 0.4 MG SL tablet Place 1 tablet (0.4 mg total) under the tongue every 5 (five) minutes as needed for chest pain. 25 tablet 3   Omega-3 Fatty Acids (FISH OIL PO) Take 1,000 mg by mouth daily.      pravastatin (PRAVACHOL) 40 MG tablet Take 1 tablet (40 mg total) by mouth daily. 90 tablet 3   Probiotic Product (PROBIOTIC PO) Take by mouth.     traMADol (ULTRAM) 50 MG tablet Take 1 tablet (50 mg total) by mouth every 8 (eight) hours as needed. 60 tablet 0   ergocalciferol (VITAMIN D2) 1.25 MG (50000 UT) capsule Take 1 capsule (50,000 Units total) by mouth once a week. 12 capsule 2   No current facility-administered medications for this visit.    ALLERGIES:  Allergies  Allergen Reactions   Diltiazem Hcl Hives and Rash   Cardizem [Diltiazem Hcl]     PHYSICAL EXAM:  Performance status (ECOG): 0 - Asymptomatic  Vitals:   12/15/19 1549  BP: 134/64  Pulse: 86  Resp: 18  Temp: (!) 97.5 F (36.4 C)  SpO2: 97%   Wt Readings from Last 3 Encounters:  12/15/19 209 lb 12.8 oz (95.2 kg)  12/02/19 (!)  209 lb (94.8 kg)  05/27/19 212 lb (96.2 kg)   Physical Exam Vitals reviewed.  Constitutional:      Appearance: Normal appearance. He is obese.  Neurological:     General: No focal deficit present.     Mental Status: He is alert and oriented to person, place, and time.  Psychiatric:        Mood and Affect: Mood normal.        Behavior: Behavior normal.     LABORATORY DATA:  I have reviewed the labs as listed.  CBC Latest Ref Rng & Units 11/12/2019 11/22/2018 11/16/2017  WBC 4.0 - 10.5 K/uL 8.6 7.9 7.8  Hemoglobin 13.0 - 17.0 g/dL 10.6(L) 10.6(L) 12.5(L)  Hematocrit 39 - 52 % 32.4(L) 31.3(L) 37.0(L)  Platelets 150 - 400 K/uL 322 312.0 293.0   CMP Latest Ref Rng & Units 11/12/2019 11/22/2018 09/07/2017  Glucose 70 - 99 mg/dL 93 100(H) 96  BUN 8 - 23 mg/dL 20 24(H) 19  Creatinine 0.61 - 1.24 mg/dL 1.01 0.92 0.90  Sodium 135 - 145 mmol/L 137 138 137  Potassium 3.5 - 5.1 mmol/L 3.6 4.2 3.9  Chloride 98 - 111 mmol/L 103 103 100  CO2 22 - 32 mmol/L 24 28 29   Calcium 8.9 - 10.3 mg/dL 8.9 8.9 9.2  Total Protein 6.5 - 8.1 g/dL 7.8 7.0 7.4  Total Bilirubin 0.3 - 1.2 mg/dL 0.5 0.5 0.4  Alkaline Phos 38 - 126 U/L 35(L) 37(L) 45  AST 15 - 41 U/L 18 13 18   ALT 0 - 44 U/L 19 15 15       Component Value Date/Time   RBC 2.81 (L) 11/12/2019 1430   MCV 115.3 (H) 11/12/2019 1430   MCH 37.7 (H) 11/12/2019 1430   MCHC 32.7 11/12/2019 1430   RDW 14.4 11/12/2019 1430   LYMPHSABS 2.7 11/12/2019 1430   MONOABS 0.9 11/12/2019 1430   EOSABS 0.2 11/12/2019 1430   BASOSABS 0.1 11/12/2019 1430   Lab Results  Component Value Date   VD25OH 13.31 (L) 11/12/2019   Lab Results  Component Value Date   LDH 130 11/12/2019   Lab Results  Component Value Date   TIBC 239 (L) 11/12/2019   FERRITIN 831 (H) 11/12/2019   FERRITIN 1,001.5 (H) 01/21/2019   IRONPCTSAT 61 (H) 11/12/2019   IRONPCTSAT 63.2 (H) 01/21/2019    DIAGNOSTIC IMAGING:  I have independently reviewed the scans and discussed with the  patient. DG Abd 1 View  Result Date: 12/12/2019 CLINICAL DATA:  MRI screening examination. EXAM: ABDOMEN - 1 VIEW COMPARISON:  None. FINDINGS: Surgical clips are seen in the right mid abdomen and pelvis. Bowel gas pattern is normal. No abnormal abdominal calcification. Multilevel thoracic and lumbar degenerative change noted. IMPRESSION: The patient may proceed to MRI.  No acute finding. Electronically Signed   By: Inge Rise M.D.   On: 12/12/2019 08:57   MR LIVER W WO CONTRAST  Result Date: 12/12/2019 CLINICAL DATA:  Hepatic iron deposition EXAM: MRI ABDOMEN WITHOUT AND WITH CONTRAST TECHNIQUE: Multiplanar multisequence MR imaging of the abdomen was performed both before and after the administration of intravenous contrast. CONTRAST:  42mL GADAVIST GADOBUTROL 1 MMOL/ML IV SOLN COMPARISON:  CT study from 2005 FINDINGS: Lower chest: No effusion. No consolidation. Limited assessment of the lung bases on MRI. Hepatobiliary: Signs of fat and iron deposition in the liver. Portal vein is patent.  No focal, suspicious hepatic lesion. Estimated fat fraction ranging from 7-12% more pronounced in the RIGHT as compared to the LEFT hepatic lobe. Based based on signal decay in the liver estimated liver iron concentration is approximately 7.7 mg/g. This would be compatible with moderate liver iron. Pancreas: Pancreas within normal intrinsic T1 signal. No focal pancreatic lesion. No ductal dilation. No inflammation. Spleen:  Spleen normal in size and contour. Adrenals/Urinary Tract:  Adrenal glands are normal. Kidneys enhance symmetrically without signs of hydronephrosis, incompletely imaged on axials. No sign of lesion on coronal images. Stomach/Bowel: Limited assessment of the gastrointestinal tract without acute process to the extent evaluated on MRI not performed for bowel evaluation. Vascular/Lymphatic: Vascular structures in the abdomen are patent without aneurysmal dilation. No adenopathy. Atheromatous plaque of  the abdominal aorta. Other:  No ascites. Musculoskeletal: No focal, suspicious lesion to the extent evaluated. IMPRESSION: 1. Iron deposition and fat fraction as outlined above. 2. No focal, suspicious hepatic lesion or morphologic changes of significant liver disease at this time Electronically Signed   By: Zetta Bills M.D.   On: 12/12/2019 17:00     ASSESSMENT:  1.  Hereditary hemochromatosis: -Patient seen for elevated ferritin levels. -EMR evaluation shows hemochromatosis testing on 01/27/2019 which showed heterozygosity for C282Y and H63D variants. -Given the elevated ferritin levels above 1000 and the results of genetic testing, this is compatible with hereditary hemochromatosis. -Patient does have arthritis of the small joints of the hands and arthritis of the back. -No history of CHF but has CAD.  No diabetes. -MRI of the liver on 12/11/2019 shows estimated liver iron concentration approximately 7.7 mg/g compatible with moderate liver iron.  Estimated fat fraction ranges from 7-12%, more pronounced in the right lobe compared to left hepatic lobe.  No focal hepatic lesions seen.  No morphological changes of significant liver disease.  2.  Macrocytic anemia: -Colonoscopy by Dr. Loletha Carrow on 02/17/2019 shows diverticulosis in the left colon, 12 mm polyp in the cecum, 4 to 6 mm polyp in the ascending colon and 5 mm polyp in the descending colon.  Pathology was consistent with tubular adenomas.   PLAN:  1.  Hereditary hemochromatosis: -We  reviewed labs from 11/12/2019.  Ferritin is elevated at 831 with percent saturation of 61. -We reviewed results of the MRI of the liver from 12/11/2019 which showed liver iron concentration is 7.7 mg/g compatible with moderate liver iron deposition. -His hemoglobin is 10.6, which prevents him from having phlebotomies. -Hence I have recommended iron chelation to decrease iron burden in the liver. -We will start him on Jadenu 7 mg/kg rounded off to 540 mg (3 tablets  of 180 mg daily) to be taken on empty stomach.  If he cannot take on empty stomach, he can take it with applesauce. -We reviewed various side effects including most common GI side effects. -He has baseline diarrhea with 2 stools per day.  He takes probiotic for it.  I have recommended to use Imodium as needed. -I will see him back in 2 to 3 weeks after the start of Jadenu to see how he is tolerating. -Target ferritin will be less than 100.  2.  Macrocytic anemia: -CBC on 11/12/2019 shows hemoglobin 10.6 with MCV of 115.  White count and platelet count was normal.  W92 and folic acid were normal.  3.  Vitamin D deficiency: -Vitamin D was 13.3. -We will start him on vitamin D 50,000 units weekly and recheck it in 3 months.   Orders placed this encounter:  No orders of the defined types were placed in this encounter.  Total time spent is 40 minutes with more than 50% of the time spent face-to-face discussing scan and lab results, treatment plan, counseling and coordination of care.  Derek Jack, MD [place] [phone]   I, Milinda Antis, am acting as a scribe for Dr. Sanda Linger.  I, Derek Jack MD, have reviewed the above documentation for accuracy and completeness, and I agree with the above.

## 2019-12-16 ENCOUNTER — Encounter: Payer: Self-pay | Admitting: Cardiovascular Disease

## 2019-12-16 ENCOUNTER — Ambulatory Visit: Payer: Medicare PPO | Admitting: Cardiovascular Disease

## 2019-12-16 ENCOUNTER — Telehealth (HOSPITAL_COMMUNITY): Payer: Self-pay | Admitting: Pharmacy Technician

## 2019-12-16 ENCOUNTER — Telehealth (HOSPITAL_COMMUNITY): Payer: Self-pay | Admitting: Pharmacist

## 2019-12-16 ENCOUNTER — Other Ambulatory Visit: Payer: Self-pay

## 2019-12-16 VITALS — BP 154/64 | HR 73 | Ht 67.0 in | Wt 210.2 lb

## 2019-12-16 DIAGNOSIS — I739 Peripheral vascular disease, unspecified: Secondary | ICD-10-CM

## 2019-12-16 DIAGNOSIS — I2510 Atherosclerotic heart disease of native coronary artery without angina pectoris: Secondary | ICD-10-CM | POA: Diagnosis not present

## 2019-12-16 MED ORDER — DEFERASIROX 180 MG PO TABS: 540.0000 mg | ORAL_TABLET | Freq: Every day | ORAL | 0 refills | Status: DC

## 2019-12-16 NOTE — Patient Instructions (Addendum)
Medication Instructions:  Your physician recommends that you continue on your current medications as directed. Please refer to the Current Medication list given to you today.  *If you need a refill on your cardiac medications before your next appointment, please call your pharmacy*   Follow-Up: At [facility], you and your health needs are our priority.  As part of our continuing mission to provide you with exceptional heart care, we have created designated Provider Care Teams.  These Care Teams include your primary Cardiologist (physician) and Advanced Practice Providers (APPs -  Physician Assistants and Nurse Practitioners) who all work together to provide you with the care you need, when you need it.  We recommend signing up for the patient portal called "MyChart".  Sign up information is provided on this After Visit Summary.  MyChart is used to connect with patients for Virtual Visits (Telemedicine).  Patients are able to view lab/test results, encounter notes, upcoming appointments, etc.  Non-urgent messages can be sent to your provider as well.   To learn more about what you can do with MyChart, go to [email or web address].    Your next appointment:   AS NEEDED with Dr. Berry 

## 2019-12-16 NOTE — Telephone Encounter (Addendum)
Oral Oncology Pharmacist Encounter  Received new prescription for Jadenu (deferasirox) for the treatment of hereditary hemochromatosis, planned duration until achievement of decreasing ferritin and maintenance of iron burden in target range.  Prescription dose and frequency assessed for appropriateness. OK for therapy initiation.   CBC w/ Diff, CMP, Ferritin, Iron and TIBC from 11/12/19 assessed, labs OK for treatment initiation. No dose adjustments necessary at this time.  Current medication list in Epic reviewed, DDIs with Jadenu identified: - Category C drug-drug interaction between Jadenu and Losartan as well as Jadenu and Tramadol. Jadenu can decrease the serum concentrations of CYP3A4 substrates, such as losarat and tramadol. Recommend monitoring for decreased efficacy of medications. Will counsel patient on importance of monitoring blood pressure and pain while on therapy.   Evaluated chart and no patient barriers to medication adherence noted.   Prescription has been e-scribed to the [facility] for benefits analysis and approval.  Oral Oncology Clinic will continue to follow for insurance authorization, copayment issues, initial counseling and start date.  Leron Croak, PharmD, BCPS Hematology/Oncology Clinical Pharmacist [facility] [phone] 12/16/2019 12:31 PM

## 2019-12-16 NOTE — Telephone Encounter (Signed)
Oral Oncology Patient Advocate Encounter  Prior Authorization for Deferasirox [place]) has been approved.    PA# [id number] Effective dates: 12/16/19 through 05/07/20  Patients co-pay is $100.00.  Oral Oncology Clinic will continue to follow.   [place] Patient [place] Phone [phone] Fax [phone] 12/16/2019 1:28 PM

## 2019-12-16 NOTE — Telephone Encounter (Signed)
Oral Oncology Patient Advocate Encounter  Received notification from [facility] that prior authorization for Jadenu (Deferasirox) is required.  PA submitted on CoverMyMeds Key [id number] Status is pending  Oral Oncology Clinic will continue to follow.  [place] Patient [place] Phone [phone] Fax [phone] 12/16/2019 1:25 PM

## 2019-12-16 NOTE — Progress Notes (Signed)
12/16/2019 Aaron Sexton   30-Aug-1945  [id number]  Primary Physician Karie Schwalbe, MD Primary Cardiologist: Runell Gess MD Nicholes Calamity, [place]  HPI:  Aaron Sexton is a 74 y.o.  mildly overweight married Caucasian male father of 3, grandfather and 3 grandchildren referred by Aaron Sexton registered nurse practitioner for evaluation of peripheral arterial disease. His cardiologist is Dr. Dina Sexton. i last saw him in the 07/24/2017.Aaron Sexton He has a history of hypertension and hyperlipidemia. I performed angioplasty of his right coronary artery back in 1994 and he's been a symptomatically since. He stopped smoking at that time. 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 He complains of worsening left greater than right lower extremity claudication. Dopplers performed at [facility] 09/11/13 revealed a right ABI 0.68 and a left ABI 0.59. There was a high-frequency signal in the right external iliac artery. Angiography recently performed showed a high-grade calcific//exophytic plaque in the right external iliac artery with a 30 mm gradient. 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 I failed to cross the highly calcified left SFA CTO however.I attempted right SFA intervention 09/20/15 unsuccessfully He does have mild lifestyle limiting claudication but this is not severe.  Since I saw him 2-1/2 years ago he continues to remain stable.  He denies chest pain or shortness of breath.  He does have lifestyle limiting claudication.  Recent Doppler studies performed 10/28/2019 revealed a patent right external iliac artery stent.  He has moderate disease in his left external iliac artery.  I did attempt unsuccessfully to open both SFA calcified CTO's and was planning on referring him to Dr. Hoy Finlay at [facility] but unfortunately he  never went.  He also failed Pletal therapy.   Current Meds  Medication Sig  . aspirin 81 MG tablet Take 81 mg by mouth daily.    . chlorthalidone (HYGROTON) 25 MG tablet TAKE 1/2 TABLET(12.5 MG) BY MOUTH DAILY  . cimetidine (TAGAMET) 400 MG tablet Take 400 mg by mouth 2 (two) times daily as needed (indigestion).   . clopidogrel (PLAVIX) 75 MG tablet TAKE 1 TABLET(75 MG) BY MOUTH DAILY WITH BREAKFAST  . Cyanocobalamin (VITAMIN B-12) 500 MCG SUBL Place 500 mcg under the tongue daily.  . Deferasirox (JADENU) 180 MG TABS Take 540 mg by mouth daily at 8 pm. Take on an empty stomach OR may crush tablets and mix with apple sauce and consume immediately  . ergocalciferol (VITAMIN D2) 1.25 MG (50000 UT) capsule Take 1 capsule (50,000 Units total) by mouth once a week.  . losartan (COZAAR) 25 MG tablet TAKE ONE TABLET BY MOUTH EVERY DAY  . metoprolol succinate (TOPROL-XL) 100 MG 24 hr tablet Take 1 tablet (100 mg total) by mouth daily. Take with or immediately following a meal.  . nitroGLYCERIN (NITROSTAT) 0.4 MG SL tablet Place 1 tablet (0.4 mg total) under the tongue every 5 (five) minutes as needed for chest pain.  . Omega-3 Fatty Acids (FISH OIL PO) Take 1,000 mg by mouth daily.   . pravastatin (PRAVACHOL) 40 MG tablet Take 1 tablet (40 mg total) by mouth daily.  . Probiotic Product (PROBIOTIC PO) Take by mouth.  . traMADol (ULTRAM) 50 MG tablet Take 1 tablet (50 mg total) by mouth every 8 (eight) hours as needed.     Allergies  Allergen Reactions  . Diltiazem Hcl Hives  and Rash  . Cardizem [Diltiazem Hcl]     Social History   Socioeconomic History  . Marital status: Married    Spouse name: Not on file  . Number of children: 3  . Years of education: Not on file  . Highest education level: Not on file  Occupational History  . 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 Smoking status: Former Smoker    Packs/day: 1.00     Years: 30.00    Pack years: 30.00    Quit date: 05/16/1990    Years since quitting: 29.6  . Smokeless tobacco: Never Used  . Tobacco comment: Quit in 1993  Vaping Use  . Vaping Use: Never used  Substance and Sexual Activity  . Alcohol use: No    Alcohol/week: 0.0 standard drinks  . Drug use: No  . Sexual activity: Not on file  Other Topics Concern  . Not on file  Social History Narrative   Has living will   Wife is health care POA---alternate is son Algernon Huxley   Would accept resuscitation but no prolonged artificial ventilation.   No tube feeds if cognitively unaware   Social Determinants of Health   Financial Resource Strain:   . Difficulty of Paying Living Expenses:   Food Insecurity:   . Worried About Programme researcher, broadcasting/film/video in the Last Year:   . Barista in the Last Year:   Transportation Needs:   . Freight forwarder (Medical):   Aaron Sexton Lack of Transportation (Non-Medical):   Physical Activity:   . Days of Exercise per Week:   . Minutes of Exercise per Session:   Stress:   . Feeling of Stress :   Social Connections:   . Frequency of Communication with Friends and Family:   . Frequency of Social Gatherings with Friends and Family:   . Attends Religious Services:   . Active Member of Clubs or Organizations:   . Attends Banker Meetings:   Aaron Sexton Marital Status:   Intimate Partner Violence:   . Fear of Current or Ex-Partner:   . Emotionally Abused:   Aaron Sexton Physically Abused:   . Sexually Abused:      Review of Systems: General: negative for chills, fever, night sweats or weight changes.  Cardiovascular: negative for chest pain, dyspnea on exertion, edema, orthopnea, palpitations, paroxysmal nocturnal dyspnea or shortness of breath Dermatological: negative for rash Respiratory: negative for cough or wheezing Urologic: negative for hematuria Abdominal: negative for nausea, vomiting, diarrhea, bright red blood per rectum, melena, or hematemesis Neurologic:  negative for visual changes, syncope, or dizziness All other systems reviewed and are otherwise negative except as noted above.    Blood pressure (!) 154/64, pulse 73, height 5\' 7"  (1.702 m), weight 210 lb 3.2 oz (95.3 kg), SpO2 96 %.  General appearance: alert and no distress Neck: no adenopathy, no JVD, supple, symmetrical, trachea midline, thyroid not enlarged, symmetric, no tenderness/mass/nodules and Bilateral carotid bruits Lungs: clear to auscultation bilaterally Heart: regular rate and rhythm, S1, S2 normal, no murmur, click, rub or gallop Extremities: extremities normal, atraumatic, no cyanosis or edema Pulses: Absent pedal pulses Skin: Skin color, texture, turgor normal. No rashes or lesions Neurologic: Alert and oriented X 3, normal strength and tone. Normal symmetric reflexes. Normal coordination and gait  EKG sinus rhythm at 73 without ST or T wave changes.  I personally reviewed this EKG.  ASSESSMENT AND PLAN:  Peripheral vascular disease [facility]) Mr. Ree returns today for follow-up.  He has since significant PAD status post orbital atherectomy and stenting of a calcified right external iliac artery by myself 08/09/2015 with documented bilateral SFA calcified CTO's both of which I tried unsuccessfully to open.  He did fail Pletal.  I was going to refer him to Dr. Hoy Finlay at [facility] in [place] however he failed to go there.  He still has lifestyle limiting claudication.  I do not believe he is a candidate for femoropopliteal bypass grafting since his symptoms are fairly stable.  At this point, I recommend conservative medical therapy.      Runell Gess MD FACP,FACC,FAHA, [facility] 12/16/2019 1:56 PM

## 2019-12-16 NOTE — Assessment & Plan Note (Signed)
Aaron Sexton returns today for follow-up.  He has since significant PAD status post orbital atherectomy and stenting of a calcified right external iliac artery by myself 08/09/2015 with documented bilateral SFA calcified CTO's both of which I tried unsuccessfully to open.  He did fail Pletal.  I was going to refer him to Dr. [name] at [facility] in [place] however he failed to go there.  He still has lifestyle limiting claudication.  I do not believe he is a candidate for femoropopliteal bypass grafting since his symptoms are fairly stable.  At this point, I recommend conservative medical therapy.

## 2019-12-17 ENCOUNTER — Other Ambulatory Visit (HOSPITAL_COMMUNITY): Payer: Self-pay | Admitting: Hematology

## 2019-12-17 NOTE — Telephone Encounter (Signed)
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   Counseled patient on administration, dosing, side effects, monitoring, drug-food interactions, safe handling, storage, and disposal.  Patient will take Jadenu 180mg  tablets, 3 tablets (540mg ) once daily on an empty stomach OR may crush tablets and sprinkle over apple sauce and consume immediately   Patient knows to separate Jadenu administration for aluminum antacids.  Jadenu start date: 12/19/2019  Adverse effects include but are not limited to: nausea, vomiting, diarrhea, rash, hearing or sight disturbances, renal failure, decreased blood counts, and hypersensitivity reactions.    Reviewed with patient importance of keeping a medication schedule and plan for any missed doses. No barriers to medication adherence identified.  Medication reconciliation performed and medication/allergy list updated.  Patient informed the pharmacy will reach out 5-7 days prior to needing next fill of Jadenu to coordinate continued medication acquisition to prevent break in therapy.  All questions answered.  Mr. Paluch voiced understanding and appreciation.   Patient knows to call the office with questions or concerns.  Leron Croak, PharmD, BCPS Hematology/Oncology Clinical Pharmacist [facility] [phone] 12/17/2019 1:47 PM

## 2019-12-18 MED FILL — DEFERASIROX 180 MG TABS: 180 | 30 days supply | Qty: 90 | Fill #0

## 2020-01-01 ENCOUNTER — Other Ambulatory Visit: Payer: Self-pay | Admitting: Internal Medicine

## 2020-01-07 ENCOUNTER — Inpatient Hospital Stay (HOSPITAL_COMMUNITY): Payer: Medicare PPO

## 2020-01-07 ENCOUNTER — Other Ambulatory Visit: Payer: Self-pay

## 2020-01-07 ENCOUNTER — Inpatient Hospital Stay (HOSPITAL_COMMUNITY): Payer: Medicare PPO | Attending: Hematology | Admitting: Hematology

## 2020-01-07 DIAGNOSIS — Z86010 Personal history of colonic polyps: Secondary | ICD-10-CM | POA: Diagnosis not present

## 2020-01-07 DIAGNOSIS — K5790 Diverticulosis of intestine, part unspecified, without perforation or abscess without bleeding: Secondary | ICD-10-CM | POA: Insufficient documentation

## 2020-01-07 DIAGNOSIS — Z79899 Other long term (current) drug therapy: Secondary | ICD-10-CM | POA: Diagnosis not present

## 2020-01-07 DIAGNOSIS — Z801 Family history of malignant neoplasm of trachea, bronchus and lung: Secondary | ICD-10-CM | POA: Insufficient documentation

## 2020-01-07 DIAGNOSIS — K219 Gastro-esophageal reflux disease without esophagitis: Secondary | ICD-10-CM | POA: Insufficient documentation

## 2020-01-07 DIAGNOSIS — Z87891 Personal history of nicotine dependence: Secondary | ICD-10-CM | POA: Diagnosis not present

## 2020-01-07 DIAGNOSIS — I739 Peripheral vascular disease, unspecified: Secondary | ICD-10-CM | POA: Insufficient documentation

## 2020-01-07 DIAGNOSIS — N529 Male erectile dysfunction, unspecified: Secondary | ICD-10-CM | POA: Diagnosis not present

## 2020-01-07 DIAGNOSIS — M1990 Unspecified osteoarthritis, unspecified site: Secondary | ICD-10-CM | POA: Diagnosis not present

## 2020-01-07 DIAGNOSIS — Z7982 Long term (current) use of aspirin: Secondary | ICD-10-CM | POA: Insufficient documentation

## 2020-01-07 DIAGNOSIS — E559 Vitamin D deficiency, unspecified: Secondary | ICD-10-CM | POA: Insufficient documentation

## 2020-01-07 DIAGNOSIS — E538 Deficiency of other specified B group vitamins: Secondary | ICD-10-CM | POA: Insufficient documentation

## 2020-01-07 DIAGNOSIS — R5383 Other fatigue: Secondary | ICD-10-CM | POA: Diagnosis not present

## 2020-01-07 DIAGNOSIS — I2510 Atherosclerotic heart disease of native coronary artery without angina pectoris: Secondary | ICD-10-CM | POA: Insufficient documentation

## 2020-01-07 DIAGNOSIS — E785 Hyperlipidemia, unspecified: Secondary | ICD-10-CM | POA: Insufficient documentation

## 2020-01-07 DIAGNOSIS — R197 Diarrhea, unspecified: Secondary | ICD-10-CM | POA: Insufficient documentation

## 2020-01-07 LAB — COMPREHENSIVE METABOLIC PANEL
ALT: 21 U/L (ref 0–44)
AST: 17 U/L (ref 15–41)
Albumin: 4.2 g/dL (ref 3.5–5.0)
Alkaline Phosphatase: 37 U/L — ABNORMAL LOW (ref 38–126)
Anion gap: 9 (ref 5–15)
BUN: 21 mg/dL (ref 8–23)
CO2: 24 mmol/L (ref 22–32)
Calcium: 9.1 mg/dL (ref 8.9–10.3)
Chloride: 103 mmol/L (ref 98–111)
Creatinine, Ser: 0.97 mg/dL (ref 0.61–1.24)
GFR calc Af Amer: >60 mL/min (ref >60)
GFR calc non Af Amer: >60 mL/min (ref >60)
Glucose, Bld: 111 mg/dL — ABNORMAL HIGH (ref 70–99)
Potassium: 3.7 mmol/L (ref 3.5–5.1)
Sodium: 136 mmol/L (ref 135–145)
Total Bilirubin: 0.4 mg/dL (ref 0.3–1.2)
Total Protein: 7.8 g/dL (ref 6.5–8.1)

## 2020-01-07 LAB — CBC WITH DIFFERENTIAL/PLATELET
Abs Immature Granulocytes: 0.03 10*3/uL (ref 0.00–0.07)
Basophils Absolute: 0.1 10*3/uL (ref 0.0–0.1)
Basophils Relative: 1 %
Eosinophils Absolute: 0.3 10*3/uL (ref 0.0–0.5)
Eosinophils Relative: 3 %
HCT: 31.2 % — ABNORMAL LOW (ref 39.0–52.0)
Hemoglobin: 10.4 g/dL — ABNORMAL LOW (ref 13.0–17.0)
Immature Granulocytes: 0 %
Lymphocytes Relative: 27 %
Lymphs Abs: 2.5 10*3/uL (ref 0.7–4.0)
MCH: 37.8 pg — ABNORMAL HIGH (ref 26.0–34.0)
MCHC: 33.3 g/dL (ref 30.0–36.0)
MCV: 113.5 fL — ABNORMAL HIGH (ref 80.0–100.0)
Monocytes Absolute: 1 10*3/uL (ref 0.1–1.0)
Monocytes Relative: 10 %
Neutro Abs: 5.5 10*3/uL (ref 1.7–7.7)
Neutrophils Relative %: 59 %
Platelets: 322 10*3/uL (ref 150–400)
RBC: 2.75 MIL/uL — ABNORMAL LOW (ref 4.22–5.81)
RDW: 14.4 % (ref 11.5–15.5)
WBC: 9.4 10*3/uL (ref 4.0–10.5)
nRBC: 0.4 % — ABNORMAL HIGH (ref 0.0–0.2)

## 2020-01-07 LAB — MAGNESIUM: Magnesium: 2.1 mg/dL (ref 1.7–2.4)

## 2020-01-07 NOTE — Progress Notes (Signed)
Aaron Sexton, [place] [id number]   CLINIC:  Medical Oncology/Hematology  PCP:  Venia Carbon, MD [place] / [place] [address]  [phone]  REASON FOR VISIT:  Follow-up for hereditary hemochromatosis  PRIOR THERAPY: None  CURRENT THERAPY: Deferasirox daily  INTERVAL HISTORY:  Aaron Sexton, a 74 y.o. 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 Aaron Sexton was last seen on 12/15/2019.  Today Aaron Sexton is accompanied by his wife. Aaron Sexton started taking Deferasirox 3 tablets every night at 8PM around 8/19 and tolerates it well. Aaron Sexton reports that Aaron Sexton had 1 episode of loose stool upon starting Deferasirox, but it stopped and now Aaron Sexton has 2 BM's daily. His appetite is good and Aaron Sexton denies having N/V.   REVIEW OF SYSTEMS:  Review of Systems  Constitutional: Positive for fatigue (mild). Negative for appetite change.  Gastrointestinal: Positive for diarrhea. Negative for nausea and vomiting.  All other systems reviewed and are negative.   PAST MEDICAL/SURGICAL HISTORY:  Past Medical History:  Diagnosis Date  . Anemia   . Arteriosclerotic cardiovascular disease (ASCVD) 1993   Critical RCA disease in 1993 treated with PTCA  . Cerebrovascular disease    Moderate ASVD without focal stenosis in 01/2007  . Colon polyps 3/12   single 95mm polyp--tubular adenoma  . Diverticulosis 2012   found on colonoscopy  . Erectile dysfunction   . Family history of adverse reaction to anesthesia    difficult for son & sistor to wake   . GERD (gastroesophageal reflux disease)   . Hematochezia 05/24/2010  . Hyperlipidemia   . Osteoarthritis    knees/hands-Dr Ronnie Derby  . Overweight(278.02)   . Peripheral vascular disease ([place]) 03/11/2010   Moderate SFA stenosis; history of claudication  . Pneumonia   . Rosacea   . Tobacco abuse, in remission    30-40 pack years discontinued in 1993  . Vitamin B12 deficiency    Past Surgical  History:  Procedure Laterality Date  . APPENDECTOMY    . COLONOSCOPY W/ POLYPECTOMY  2012  . ILIAC VEIN ANGIOPLASTY / STENTING Right 08/09/2015  . PERIPHERAL VASCULAR CATHETERIZATION Bilateral 06/21/2015   Procedure: Lower Extremity Angiography;  Surgeon: Lorretta Harp, MD;  Location: [facility];  Service: Cardiovascular;  Laterality: Bilateral;  . PERIPHERAL VASCULAR CATHETERIZATION N/A 06/21/2015   Procedure: Abdominal Aortogram;  Surgeon: Lorretta Harp, MD;  Location: [facility];  Service: Cardiovascular;  Laterality: N/A;  . PERIPHERAL VASCULAR CATHETERIZATION N/A 07/26/2015   Procedure: Lower Extremity Angiography;  Surgeon: Lorretta Harp, MD;  Location: [facility];  Service: Cardiovascular;  Laterality: N/A;  . PERIPHERAL VASCULAR CATHETERIZATION N/A 08/09/2015   Procedure: Lower Extremity Angiography;  Surgeon: Lorretta Harp, MD;  Location: [facility];  Service: Cardiovascular;  Laterality: N/A;  . PERIPHERAL VASCULAR CATHETERIZATION  08/09/2015   Procedure: Peripheral Vascular Intervention;  Surgeon: Lorretta Harp, MD;  Location: [facility];  Service: Cardiovascular;;  rt ext. iliac atherectomy and stent  . PERIPHERAL VASCULAR CATHETERIZATION N/A 09/20/2015   Procedure: Lower Extremity Angiography;  Surgeon: Lorretta Harp, MD;  Location: [facility];  Service: Cardiovascular;  Laterality: N/A;  . PERIPHERAL VASCULAR CATHETERIZATION Right 09/20/2015   Procedure: Peripheral Vascular Intervention;  Surgeon: Lorretta Harp, MD;  Location: [facility];  Service: Cardiovascular;  Laterality: Right;  SFA  . ROTATOR CUFF REPAIR Left 7/15   Dr Ronnie Derby  SOCIAL HISTORY:  Social History   Socioeconomic History  . Marital status: Married    Spouse name: Not on file  . Number of children: 3  . Years of education: Not on file  . Highest education level: Not on file  Occupational History  . 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 Smoking status: Former Smoker    Packs/day: 1.00    Years: 30.00    Pack years: 30.00    Quit date: 05/16/1990    Years since quitting: 29.6  . Smokeless tobacco: Never Used  . Tobacco comment: Quit in 1993  Vaping Use  . Vaping Use: Never used  Substance and Sexual Activity  . Alcohol use: No    Alcohol/week: 0.0 standard drinks  . Drug use: No  . Sexual activity: Not on file  Other Topics Concern  . Not on file  Social History Narrative   Has living will   Wife is health care POA---alternate is son Monica Martinez   Would accept resuscitation but no prolonged artificial ventilation.   No tube feeds if cognitively unaware   Social Determinants of Health   Financial Resource Strain:   . Difficulty of Paying Living Expenses: Not on file  Food Insecurity:   . Worried About Charity fundraiser in the Last Year: Not on file  . Ran Out of Food in the Last Year: Not on file  Transportation Needs:   . Lack of Transportation (Medical): Not on file  . Lack of Transportation (Non-Medical): Not on file  Physical Activity:   . Days of Exercise per Week: Not on file  . Minutes of Exercise per Session: Not on file  Stress:   . Feeling of Stress : Not on file  Social Connections:   . Frequency of Communication with Friends and Family: Not on file  . Frequency of Social Gatherings with Friends and Family: Not on file  . Attends Religious Services: Not on file  . Active Member of Clubs or Organizations: Not on file  . Attends Archivist Meetings: Not on file  . Marital Status: Not on file  Intimate Partner Violence:   . Fear of Current or Ex-Partner: Not on file  . Emotionally Abused: Not on file  . Physically Abused: Not on file  . Sexually Abused: Not on file    FAMILY HISTORY:  Family History  Problem Relation Age of Onset  . Hypertension Father        And siblings  . Heart disease Father        And  second-degree relatives  . Transient ischemic attack Father   . Lung cancer Mother   . Diabetes Mother   . Brain cancer Sister   . Diabetes Brother   . Atrial fibrillation Brother   . Heart disease Sister   . Heart disease Sister   . Thyroid disease Sister   . Ulcers Son   . Diabetes Son   . Diabetes Son     CURRENT MEDICATIONS:  Current Outpatient Medications  Medication Sig Dispense Refill  . aspirin 81 MG tablet Take 81 mg by mouth daily.      . chlorthalidone (HYGROTON) 25 MG tablet TAKE 1/2 TABLET(12.5 MG) BY MOUTH DAILY 45 tablet 3  . cimetidine (TAGAMET) 400 MG tablet Take 400 mg by mouth 2 (two) times daily as needed (indigestion).     . clopidogrel (PLAVIX) 75 MG tablet TAKE 1  TABLET(75 MG) BY MOUTH DAILY WITH BREAKFAST 30 tablet 11  . Cyanocobalamin (VITAMIN B-12) 500 MCG SUBL Place 500 mcg under the tongue daily.    . Deferasirox 180 MG TABS TAKE 3 TABLETS(540MG ) BY MOUTH DAILY AT 8PM ON AN EMPTY STOMACH OR SPRINKLE OVER APPLE SAUCE AND CONSUME IMMEDIATELY. 270 tablet 0  . ergocalciferol (VITAMIN D2) 1.25 MG (50000 UT) capsule Take 1 capsule (50,000 Units total) by mouth once a week. 12 capsule 2  . losartan (COZAAR) 25 MG tablet TAKE ONE TABLET BY MOUTH EVERY DAY 90 tablet 3  . metoprolol succinate (TOPROL-XL) 100 MG 24 hr tablet Take 1 tablet (100 mg total) by mouth daily. Take with or immediately following a meal. 90 tablet 3  . Omega-3 Fatty Acids (FISH OIL PO) Take 1,000 mg by mouth daily.     . pravastatin (PRAVACHOL) 40 MG tablet TAKE 1 TABLET(40 MG) BY MOUTH DAILY 90 tablet 3  . Probiotic Product (PROBIOTIC PO) Take by mouth.    . traMADol (ULTRAM) 50 MG tablet TAKE 1 TABLET(50 MG) BY MOUTH EVERY 8 HOURS AS NEEDED 60 tablet 0  . nitroGLYCERIN (NITROSTAT) 0.4 MG SL tablet Place 1 tablet (0.4 mg total) under the tongue every 5 (five) minutes as needed for chest pain. (Patient not taking: Reported on 01/07/2020) 25 tablet 3   No current facility-administered medications  for this visit.    ALLERGIES:  Allergies  Allergen Reactions  . Diltiazem Hcl Hives and Rash  . Cardizem [Diltiazem Hcl]     PHYSICAL EXAM:  Performance status (ECOG): 0 - Asymptomatic  Vitals:   01/07/20 1457  BP: 125/71  Pulse: 81  Resp: 18  Temp: (!) 97.5 F (36.4 C)  SpO2: 98%   Wt Readings from Last 3 Encounters:  01/07/20 209 lb 14.1 oz (95.2 kg)  12/16/19 210 lb 3.2 oz (95.3 kg)  12/15/19 209 lb 12.8 oz (95.2 kg)   Physical Exam Vitals reviewed.  Constitutional:      Appearance: Normal appearance. Aaron Sexton is obese.  Cardiovascular:     Rate and Rhythm: Normal rate and regular rhythm.     Pulses: Normal pulses.     Heart sounds: Normal heart sounds.  Pulmonary:     Effort: Pulmonary effort is normal.     Breath sounds: Normal breath sounds.  Abdominal:     Palpations: Abdomen is soft. There is no hepatomegaly, splenomegaly or mass.     Tenderness: There is no abdominal tenderness.     Hernia: No hernia is present.  Musculoskeletal:     Right lower leg: No edema.     Left lower leg: No edema.  Neurological:     General: No focal deficit present.     Mental Status: Aaron Sexton is alert and oriented to person, place, and time.  Psychiatric:        Mood and Affect: Mood normal.        Behavior: Behavior normal.     LABORATORY DATA:  I have reviewed the labs as listed.  CBC Latest Ref Rng & Units 01/07/2020 11/12/2019 11/22/2018  WBC 4.0 - 10.5 K/uL 9.4 8.6 7.9  Hemoglobin 13.0 - 17.0 g/dL 10.4(L) 10.6(L) 10.6(L)  Hematocrit 39 - 52 % 31.2(L) 32.4(L) 31.3(L)  Platelets 150 - 400 K/uL 322 322 312.0   CMP Latest Ref Rng & Units 01/07/2020 11/12/2019 11/22/2018  Glucose 70 - 99 mg/dL 111(H) 93 100(H)  BUN 8 - 23 mg/dL 21 20 24(H)  Creatinine 0.61 - 1.24 mg/dL 0.97  1.01 0.92  Sodium 135 - 145 mmol/L 136 137 138  Potassium 3.5 - 5.1 mmol/L 3.7 3.6 4.2  Chloride 98 - 111 mmol/L 103 103 103  CO2 22 - 32 mmol/L 24 24 28   Calcium 8.9 - 10.3 mg/dL 9.1 8.9 8.9  Total Protein 6.5  - 8.1 g/dL 7.8 7.8 7.0  Total Bilirubin 0.3 - 1.2 mg/dL 0.4 0.5 0.5  Alkaline Phos 38 - 126 U/L 37(L) 35(L) 37(L)  AST 15 - 41 U/L 17 18 13   ALT 0 - 44 U/L 21 19 15       Component Value Date/Time   RBC 2.75 (L) 01/07/2020 1436   MCV 113.5 (H) 01/07/2020 1436   MCH 37.8 (H) 01/07/2020 1436   MCHC 33.3 01/07/2020 1436   RDW 14.4 01/07/2020 1436   LYMPHSABS PENDING 01/07/2020 1436   MONOABS PENDING 01/07/2020 1436   EOSABS PENDING 01/07/2020 1436   BASOSABS PENDING 01/07/2020 1436    DIAGNOSTIC IMAGING:  I have independently reviewed the scans and discussed with the patient. DG Abd 1 View  Result Date: 12/12/2019 CLINICAL DATA:  MRI screening examination. EXAM: ABDOMEN - 1 VIEW COMPARISON:  None. FINDINGS: Surgical clips are seen in the right mid abdomen and pelvis. Bowel gas pattern is normal. No abnormal abdominal calcification. Multilevel thoracic and lumbar degenerative change noted. IMPRESSION: The patient may proceed to MRI.  No acute finding. Electronically Signed   By: Inge Rise M.D.   On: 12/12/2019 08:57   MR LIVER W WO CONTRAST  Result Date: 12/12/2019 CLINICAL DATA:  Hepatic iron deposition EXAM: MRI ABDOMEN WITHOUT AND WITH CONTRAST TECHNIQUE: Multiplanar multisequence MR imaging of the abdomen was performed both before and after the administration of intravenous contrast. CONTRAST:  60mL GADAVIST GADOBUTROL 1 MMOL/ML IV SOLN COMPARISON:  CT study from 2005 FINDINGS: Lower chest: No effusion. No consolidation. Limited assessment of the lung bases on MRI. Hepatobiliary: Signs of fat and iron deposition in the liver. Portal vein is patent.  No focal, suspicious hepatic lesion. Estimated fat fraction ranging from 7-12% more pronounced in the RIGHT as compared to the LEFT hepatic lobe. Based based on signal decay in the liver estimated liver iron concentration is approximately 7.7 mg/g. This would be compatible with moderate liver iron. Pancreas: Pancreas within normal intrinsic  T1 signal. No focal pancreatic lesion. No ductal dilation. No inflammation. Spleen:  Spleen normal in size and contour. Adrenals/Urinary Tract:  Adrenal glands are normal. Kidneys enhance symmetrically without signs of hydronephrosis, incompletely imaged on axials. No sign of lesion on coronal images. Stomach/Bowel: Limited assessment of the gastrointestinal tract without acute process to the extent evaluated on MRI not performed for bowel evaluation. Vascular/Lymphatic: Vascular structures in the abdomen are patent without aneurysmal dilation. No adenopathy. Atheromatous plaque of the abdominal aorta. Other:  No ascites. Musculoskeletal: No focal, suspicious lesion to the extent evaluated. IMPRESSION: 1. Iron deposition and fat fraction as outlined above. 2. No focal, suspicious hepatic lesion or morphologic changes of significant liver disease at this time Electronically Signed   By: Zetta Bills M.D.   On: 12/12/2019 17:00     ASSESSMENT:  1. Hereditary hemochromatosis: -Patient seen for elevated ferritin levels. -EMR evaluation shows hemochromatosis testing on 01/27/2019 which showed heterozygosity for C282Y and H63D variants. -Given the elevated ferritin levels above 1000 and the results of genetic testing, this is compatible with hereditary hemochromatosis. -Patient does have arthritis of the small joints of the hands and arthritis of the back. -No history of CHF  but has CAD.  No diabetes. -MRI of the liver on 12/11/2019 shows estimated liver iron concentration approximately 7.7 mg/g compatible with moderate liver iron.  Estimated fat fraction ranges from 7-12%, more pronounced in the right lobe compared to left hepatic lobe.  No focal hepatic lesions seen.  No morphological changes of significant liver disease.  2. Macrocytic anemia: -Colonoscopy by Dr. Loletha Carrow on 02/17/2019 shows diverticulosis in the left colon, 12 mm polyp in the cecum, 4 to 6 mm polyp in the ascending colon and 5 mm polyp in  the descending colon. Pathology was consistent with tubular adenomas.   PLAN:  1. Hereditary hemochromatosis: -Jadenu 540 mg daily started on 12/24/2019. -Aaron Sexton had some diarrhea lasting 2 days which subsided. -Otherwise Aaron Sexton is tolerating it very well.  Today's LFTs are normal. -Use Imodium as needed for diarrhea.  I plan to see him back in a month for follow-up.  I will check ferritin and iron panel at this time.  Target ferritin less than 100.   2. Macrocytic anemia: -CBC today shows stable hemoglobin of 10.4.  MCV is 113.  3.  Vitamin D deficiency: -Continue vitamin D 50,000 units weekly.  Recheck in 2 to 3 months..  Orders placed this encounter:  No orders of the defined types were placed in this encounter.    Derek Jack, MD [place] [phone]   I, Milinda Antis, am acting as a scribe for Dr. Sanda Linger.  I, Derek Jack MD, have reviewed the above documentation for accuracy and completeness, and I agree with the above.

## 2020-01-07 NOTE — Patient Instructions (Signed)
[facility] at [facility] Discharge Instructions  You were seen today by Dr. Katragadda. He went over your recent results. Dr. Katragadda will see you back in 1 month for labs and follow up.   Thank you for choosing [facility] at [facility] to provide your oncology and hematology care.  To afford each patient quality time with our provider, please arrive at least 15 minutes before your scheduled appointment time.   If you have a lab appointment with the Cancer Center please come in thru the Main Entrance and check in at the main information desk  You need to re-schedule your appointment should you arrive 10 or more minutes late.  We strive to give you quality time with our providers, and arriving late affects you and other patients whose appointments are after yours.  Also, if you no show three or more times for appointments you may be dismissed from the clinic at the providers discretion.     Again, thank you for choosing [facility].  Our hope is that these requests will decrease the amount of time that you wait before being seen by our physicians.       _____________________________________________________________  Should you have questions after your visit to [facility], please contact our office at [phone] between the hours of 8:00 a.m. and 4:30 p.m.  Voicemails left after 4:00 p.m. will not be returned until the following business day.  For prescription refill requests, have your pharmacy contact our office and allow 72 hours.    Cancer Center Support Programs:   > Cancer Support Group  2nd Tuesday of the month 1pm-2pm, Journey Room   

## 2020-01-14 ENCOUNTER — Other Ambulatory Visit (HOSPITAL_COMMUNITY): Payer: Self-pay | Admitting: Hematology

## 2020-01-14 ENCOUNTER — Other Ambulatory Visit (HOSPITAL_COMMUNITY): Payer: Self-pay | Admitting: *Deleted

## 2020-01-15 MED FILL — DEFERASIROX 180 MG TABS: 180 | 30 days supply | Qty: 90 | Fill #0

## 2020-02-02 ENCOUNTER — Other Ambulatory Visit: Payer: Self-pay | Admitting: Internal Medicine

## 2020-02-02 NOTE — Telephone Encounter (Signed)
Last filled 01-01-20 #60 Last OV 12-02-19 Next OV 12-03-20 [facility] [address] [employer]

## 2020-02-04 ENCOUNTER — Other Ambulatory Visit (HOSPITAL_COMMUNITY): Payer: Self-pay | Admitting: Nurse Practitioner

## 2020-02-09 ENCOUNTER — Inpatient Hospital Stay (HOSPITAL_COMMUNITY): Payer: Medicare PPO | Attending: Hematology | Admitting: Hematology

## 2020-02-09 ENCOUNTER — Inpatient Hospital Stay (HOSPITAL_COMMUNITY): Payer: Medicare PPO

## 2020-02-09 ENCOUNTER — Other Ambulatory Visit: Payer: Self-pay

## 2020-02-09 DIAGNOSIS — D649 Anemia, unspecified: Secondary | ICD-10-CM | POA: Insufficient documentation

## 2020-02-09 DIAGNOSIS — E785 Hyperlipidemia, unspecified: Secondary | ICD-10-CM | POA: Insufficient documentation

## 2020-02-09 DIAGNOSIS — I739 Peripheral vascular disease, unspecified: Secondary | ICD-10-CM | POA: Insufficient documentation

## 2020-02-09 DIAGNOSIS — Z86010 Personal history of colonic polyps: Secondary | ICD-10-CM | POA: Insufficient documentation

## 2020-02-09 DIAGNOSIS — M1990 Unspecified osteoarthritis, unspecified site: Secondary | ICD-10-CM | POA: Insufficient documentation

## 2020-02-09 DIAGNOSIS — I2510 Atherosclerotic heart disease of native coronary artery without angina pectoris: Secondary | ICD-10-CM | POA: Diagnosis not present

## 2020-02-09 DIAGNOSIS — K219 Gastro-esophageal reflux disease without esophagitis: Secondary | ICD-10-CM | POA: Insufficient documentation

## 2020-02-09 DIAGNOSIS — E538 Deficiency of other specified B group vitamins: Secondary | ICD-10-CM | POA: Diagnosis not present

## 2020-02-09 DIAGNOSIS — Z87891 Personal history of nicotine dependence: Secondary | ICD-10-CM | POA: Diagnosis not present

## 2020-02-09 DIAGNOSIS — N529 Male erectile dysfunction, unspecified: Secondary | ICD-10-CM | POA: Insufficient documentation

## 2020-02-09 DIAGNOSIS — E559 Vitamin D deficiency, unspecified: Secondary | ICD-10-CM | POA: Diagnosis not present

## 2020-02-09 LAB — CBC WITH DIFFERENTIAL/PLATELET
Abs Immature Granulocytes: 0.02 10*3/uL (ref 0.00–0.07)
Basophils Absolute: 0.1 10*3/uL (ref 0.0–0.1)
Basophils Relative: 1 %
Eosinophils Absolute: 0.3 10*3/uL (ref 0.0–0.5)
Eosinophils Relative: 4 %
HCT: 29.3 % — ABNORMAL LOW (ref 39.0–52.0)
Hemoglobin: 9.7 g/dL — ABNORMAL LOW (ref 13.0–17.0)
Immature Granulocytes: 0 %
Lymphocytes Relative: 31 %
Lymphs Abs: 2.7 10*3/uL (ref 0.7–4.0)
MCH: 37.3 pg — ABNORMAL HIGH (ref 26.0–34.0)
MCHC: 33.1 g/dL (ref 30.0–36.0)
MCV: 112.7 fL — ABNORMAL HIGH (ref 80.0–100.0)
Monocytes Absolute: 1 10*3/uL (ref 0.1–1.0)
Monocytes Relative: 12 %
Neutro Abs: 4.4 10*3/uL (ref 1.7–7.7)
Neutrophils Relative %: 52 %
Platelets: 311 10*3/uL (ref 150–400)
RBC: 2.6 MIL/uL — ABNORMAL LOW (ref 4.22–5.81)
RDW: 14.4 % (ref 11.5–15.5)
WBC: 8.5 10*3/uL (ref 4.0–10.5)
nRBC: 0.2 % (ref 0.0–0.2)

## 2020-02-09 LAB — IRON AND TIBC
Iron: 170 ug/dL (ref 45–182)
Saturation Ratios: 68 % — ABNORMAL HIGH (ref 17.9–39.5)
TIBC: 251 ug/dL (ref 250–450)
UIBC: 81 ug/dL

## 2020-02-09 LAB — FERRITIN: Ferritin: 944 ng/mL — ABNORMAL HIGH (ref 24–336)

## 2020-02-09 LAB — COMPREHENSIVE METABOLIC PANEL
ALT: 43 U/L (ref 0–44)
AST: 26 U/L (ref 15–41)
Albumin: 3.8 g/dL (ref 3.5–5.0)
Alkaline Phosphatase: 34 U/L — ABNORMAL LOW (ref 38–126)
Anion gap: 9 (ref 5–15)
BUN: 23 mg/dL (ref 8–23)
CO2: 24 mmol/L (ref 22–32)
Calcium: 8.9 mg/dL (ref 8.9–10.3)
Chloride: 103 mmol/L (ref 98–111)
Creatinine, Ser: 1 mg/dL (ref 0.61–1.24)
GFR calc Af Amer: >60 mL/min (ref >60)
GFR calc non Af Amer: >60 mL/min (ref >60)
Glucose, Bld: 93 mg/dL (ref 70–99)
Potassium: 3.8 mmol/L (ref 3.5–5.1)
Sodium: 136 mmol/L (ref 135–145)
Total Bilirubin: 0.6 mg/dL (ref 0.3–1.2)
Total Protein: 6.9 g/dL (ref 6.5–8.1)

## 2020-02-09 MED ORDER — DEFERASIROX 180 MG PO TABS: 720.0000 mg | ORAL_TABLET | Freq: Every day | ORAL | 0 refills | Status: DC

## 2020-02-09 NOTE — Progress Notes (Signed)
Aaron Sexton, [place] [address]   CLINIC:  Medical Oncology/Hematology  PCP:  Venia Carbon, MD [place] / [place] [address]  [phone]  REASON FOR VISIT:  Follow-up for hereditary hemochromatosis  PRIOR THERAPY: None  CURRENT THERAPY: Deferasirox daily  INTERVAL HISTORY:  Aaron Sexton, a 74 y.o. 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 Aaron Sexton was last seen on 01/07/2020.  Today he is accompanied by his son. He reports feeling well. He is taking Deferasirox 3 tablets at 8 PM daily and gets hungry before he goes to sleep; he is tolerating it well and denies having nausea or any other side effects.   REVIEW OF SYSTEMS:  Review of Systems  Constitutional: Positive for fatigue (80%). Negative for appetite change.  Gastrointestinal: Negative for nausea.  All other systems reviewed and are negative.   PAST MEDICAL/SURGICAL HISTORY:  Past Medical History:  Diagnosis Date  . Anemia   . Arteriosclerotic cardiovascular disease (ASCVD) 1993   Critical RCA disease in 1993 treated with PTCA  . Cerebrovascular disease    Moderate ASVD without focal stenosis in 01/2007  . Colon polyps 3/12   single 64mm polyp--tubular adenoma  . Diverticulosis 2012   found on colonoscopy  . Erectile dysfunction   . Family history of adverse reaction to anesthesia    difficult for son & sistor to wake   . GERD (gastroesophageal reflux disease)   . Hematochezia 05/24/2010  . Hyperlipidemia   . Osteoarthritis    knees/hands-Dr Ronnie Derby  . Overweight(278.02)   . Peripheral vascular disease ([place]) 03/11/2010   Moderate SFA stenosis; history of claudication  . Pneumonia   . Rosacea   . Tobacco abuse, in remission    30-40 pack years discontinued in 1993  . Vitamin B12 deficiency    Past Surgical History:  Procedure Laterality Date  . APPENDECTOMY    . COLONOSCOPY W/ POLYPECTOMY  2012  . ILIAC VEIN  ANGIOPLASTY / STENTING Right 08/09/2015  . PERIPHERAL VASCULAR CATHETERIZATION Bilateral 06/21/2015   Procedure: Lower Extremity Angiography;  Surgeon: Lorretta Harp, MD;  Location: [facility];  Service: Cardiovascular;  Laterality: Bilateral;  . PERIPHERAL VASCULAR CATHETERIZATION N/A 06/21/2015   Procedure: Abdominal Aortogram;  Surgeon: Lorretta Harp, MD;  Location: [facility];  Service: Cardiovascular;  Laterality: N/A;  . PERIPHERAL VASCULAR CATHETERIZATION N/A 07/26/2015   Procedure: Lower Extremity Angiography;  Surgeon: Lorretta Harp, MD;  Location: [facility];  Service: Cardiovascular;  Laterality: N/A;  . PERIPHERAL VASCULAR CATHETERIZATION N/A 08/09/2015   Procedure: Lower Extremity Angiography;  Surgeon: Lorretta Harp, MD;  Location: [facility];  Service: Cardiovascular;  Laterality: N/A;  . PERIPHERAL VASCULAR CATHETERIZATION  08/09/2015   Procedure: Peripheral Vascular Intervention;  Surgeon: Lorretta Harp, MD;  Location: [facility];  Service: Cardiovascular;;  rt ext. iliac atherectomy and stent  . PERIPHERAL VASCULAR CATHETERIZATION N/A 09/20/2015   Procedure: Lower Extremity Angiography;  Surgeon: Lorretta Harp, MD;  Location: [facility];  Service: Cardiovascular;  Laterality: N/A;  . PERIPHERAL VASCULAR CATHETERIZATION Right 09/20/2015   Procedure: Peripheral Vascular Intervention;  Surgeon: Lorretta Harp, MD;  Location: [facility];  Service: Cardiovascular;  Laterality: Right;  SFA  . ROTATOR CUFF REPAIR Left 7/15   Dr Ronnie Derby    SOCIAL HISTORY:  Social History   Socioeconomic History  . Marital status: Married  Spouse name: Not on file  . Number of children: 3  . Years of education: Not on file  . Highest education level: Not on file  Occupational History  . 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 Smoking status: Former Smoker     Packs/day: 1.00    Years: 30.00    Pack years: 30.00    Quit date: 05/16/1990    Years since quitting: 29.7  . Smokeless tobacco: Never Used  . Tobacco comment: Quit in 1993  Vaping Use  . Vaping Use: Never used  Substance and Sexual Activity  . Alcohol use: No    Alcohol/week: 0.0 standard drinks  . Drug use: No  . Sexual activity: Not on file  Other Topics Concern  . Not on file  Social History Narrative   Has living will   Wife is health care POA---alternate is son Monica Martinez   Would accept resuscitation but no prolonged artificial ventilation.   No tube feeds if cognitively unaware   Social Determinants of Health   Financial Resource Strain:   . Difficulty of Paying Living Expenses: Not on file  Food Insecurity:   . Worried About Charity fundraiser in the Last Year: Not on file  . Ran Out of Food in the Last Year: Not on file  Transportation Needs:   . Lack of Transportation (Medical): Not on file  . Lack of Transportation (Non-Medical): Not on file  Physical Activity:   . Days of Exercise per Week: Not on file  . Minutes of Exercise per Session: Not on file  Stress:   . Feeling of Stress : Not on file  Social Connections:   . Frequency of Communication with Friends and Family: Not on file  . Frequency of Social Gatherings with Friends and Family: Not on file  . Attends Religious Services: Not on file  . Active Member of Clubs or Organizations: Not on file  . Attends Archivist Meetings: Not on file  . Marital Status: Not on file  Intimate Partner Violence:   . Fear of Current or Ex-Partner: Not on file  . Emotionally Abused: Not on file  . Physically Abused: Not on file  . Sexually Abused: Not on file    FAMILY HISTORY:  Family History  Problem Relation Age of Onset  . Hypertension Father        And siblings  . Heart disease Father        And second-degree relatives  . Transient ischemic attack Father   . Lung cancer Mother   . Diabetes Mother   .  Brain cancer Sister   . Diabetes Brother   . Atrial fibrillation Brother   . Heart disease Sister   . Heart disease Sister   . Thyroid disease Sister   . Ulcers Son   . Diabetes Son   . Diabetes Son     CURRENT MEDICATIONS:  Current Outpatient Medications  Medication Sig Dispense Refill  . aspirin 81 MG tablet Take 81 mg by mouth daily.      . chlorthalidone (HYGROTON) 25 MG tablet TAKE 1/2 TABLET(12.5 MG) BY MOUTH DAILY 45 tablet 3  . cimetidine (TAGAMET) 400 MG tablet Take 400 mg by mouth 2 (two) times daily as needed (indigestion).     . clopidogrel (PLAVIX) 75 MG tablet TAKE 1 TABLET(75 MG) BY MOUTH DAILY WITH BREAKFAST 30 tablet 11  . Cyanocobalamin (VITAMIN B-12) 500 MCG  SUBL Place 500 mcg under the tongue daily.    . Deferasirox 180 MG TABS Take 720 mg by mouth daily. 120 tablet 0  . ergocalciferol (VITAMIN D2) 1.25 MG (50000 UT) capsule Take 1 capsule (50,000 Units total) by mouth once a week. 12 capsule 2  . losartan (COZAAR) 25 MG tablet TAKE ONE TABLET BY MOUTH EVERY DAY 90 tablet 3  . metoprolol succinate (TOPROL-XL) 100 MG 24 hr tablet Take 1 tablet (100 mg total) by mouth daily. Take with or immediately following a meal. 90 tablet 3  . Omega-3 Fatty Acids (FISH OIL PO) Take 1,000 mg by mouth daily.     . pravastatin (PRAVACHOL) 40 MG tablet TAKE 1 TABLET(40 MG) BY MOUTH DAILY 90 tablet 3  . Probiotic Product (PROBIOTIC PO) Take by mouth.    . traMADol (ULTRAM) 50 MG tablet TAKE 1 TABLET(50 MG) BY MOUTH EVERY 8 HOURS AS NEEDED 60 tablet 0  . nitroGLYCERIN (NITROSTAT) 0.4 MG SL tablet Place 1 tablet (0.4 mg total) under the tongue every 5 (five) minutes as needed for chest pain. (Patient not taking: Reported on 02/09/2020) 25 tablet 3   No current facility-administered medications for this visit.    ALLERGIES:  Allergies  Allergen Reactions  . Diltiazem Hcl Hives and Rash  . Cardizem [Diltiazem Hcl]     PHYSICAL EXAM:  Performance status (ECOG): 0 -  Asymptomatic  Vitals:   02/09/20 1455  BP: 137/71  Pulse: 81  Resp: 18  Temp: (!) 97.1 F (36.2 C)  SpO2: 98%   Wt Readings from Last 3 Encounters:  02/09/20 210 lb (95.3 kg)  01/07/20 209 lb 14.1 oz (95.2 kg)  12/16/19 210 lb 3.2 oz (95.3 kg)   Physical Exam Vitals reviewed.  Constitutional:      Appearance: Normal appearance. He is obese.  Cardiovascular:     Rate and Rhythm: Normal rate and regular rhythm.     Pulses: Normal pulses.     Heart sounds: Normal heart sounds.  Pulmonary:     Effort: Pulmonary effort is normal.     Breath sounds: Normal breath sounds.  Abdominal:     Palpations: Abdomen is soft. There is no hepatomegaly, splenomegaly or mass.     Tenderness: There is no abdominal tenderness.  Musculoskeletal:     Right lower leg: No edema.     Left lower leg: No edema.  Neurological:     General: No focal deficit present.     Mental Status: He is alert and oriented to person, place, and time.  Psychiatric:        Mood and Affect: Mood normal.        Behavior: Behavior normal.     LABORATORY DATA:  I have reviewed the labs as listed.  CBC Latest Ref Rng & Units 02/09/2020 01/07/2020 11/12/2019  WBC 4.0 - 10.5 K/uL 8.5 9.4 8.6  Hemoglobin 13.0 - 17.0 g/dL 9.7(L) 10.4(L) 10.6(L)  Hematocrit 39 - 52 % 29.3(L) 31.2(L) 32.4(L)  Platelets 150 - 400 K/uL 311 322 322   CMP Latest Ref Rng & Units 02/09/2020 01/07/2020 11/12/2019  Glucose 70 - 99 mg/dL 93 111(H) 93  BUN 8 - 23 mg/dL 23 21 20   Creatinine 0.61 - 1.24 mg/dL 1.00 0.97 1.01  Sodium 135 - 145 mmol/L 136 136 137  Potassium 3.5 - 5.1 mmol/L 3.8 3.7 3.6  Chloride 98 - 111 mmol/L 103 103 103  CO2 22 - 32 mmol/L 24 24 24   Calcium 8.9 -  10.3 mg/dL 8.9 9.1 8.9  Total Protein 6.5 - 8.1 g/dL 6.9 7.8 7.8  Total Bilirubin 0.3 - 1.2 mg/dL 0.6 0.4 0.5  Alkaline Phos 38 - 126 U/L 34(L) 37(L) 35(L)  AST 15 - 41 U/L 26 17 18   ALT 0 - 44 U/L 43 21 19      Component Value Date/Time   RBC 2.60 (L) 02/09/2020 1344    MCV 112.7 (H) 02/09/2020 1344   MCH 37.3 (H) 02/09/2020 1344   MCHC 33.1 02/09/2020 1344   RDW 14.4 02/09/2020 1344   LYMPHSABS 2.7 02/09/2020 1344   MONOABS 1.0 02/09/2020 1344   EOSABS 0.3 02/09/2020 1344   BASOSABS 0.1 02/09/2020 1344   Lab Results  Component Value Date   TIBC 251 02/09/2020   TIBC 239 (L) 11/12/2019   FERRITIN 944 (H) 02/09/2020   FERRITIN 831 (H) 11/12/2019   FERRITIN 1,001.5 (H) 01/21/2019   IRONPCTSAT 68 (H) 02/09/2020   IRONPCTSAT 61 (H) 11/12/2019   IRONPCTSAT 63.2 (H) 01/21/2019    DIAGNOSTIC IMAGING:  I have independently reviewed the scans and discussed with the patient. No results found.   ASSESSMENT:  1. Hereditary hemochromatosis: -Patient seen for elevated ferritin levels. -EMR evaluation shows hemochromatosis testing on 01/27/2019 which showed heterozygosity for C282Y and H63D variants. -Given the elevated ferritin levels above 1000 and the results of genetic testing, this is compatible with hereditary hemochromatosis. -Patient does have arthritis of the small joints of the hands and arthritis of the back. -No history of CHF but has CAD. No diabetes. -MRI of the liver on 12/11/2019 shows estimated liver iron concentration approximately 7.7 mg/g compatible with moderate liver iron. Estimated fat fraction ranges from 7-12%, more pronounced in the right lobe compared to left hepatic lobe. No focal hepatic lesions seen. No morphological changes of significant liver disease. -Jadenu 540 mg daily started on 12/24/2019, dose increased to 720 mg daily on 02/09/2020.  2. Macrocytic anemia: -Colonoscopy by Dr. Loletha Carrow on 02/17/2019 shows diverticulosis in the left colon, 12 mm polyp in the cecum, 4 to 6 mm polyp in the ascending colon and 5 mm polyp in the descending colon. Pathology was consistent with tubular adenomas.   PLAN:  1. Hereditary hemochromatosis: -He is tolerating Jadenu very well.  No diarrhea.  No other GI symptoms. -Reviewed labs  from today.  Ferritin is 944, up from 831 on 11/12/2019. -Would recommend increasing the dose of Jadenu to 720 mg daily.  I have sent a prescription to his pharmacy.  He was told to take 4 pills daily until he finishes the bottle. -RTC 1 month with labs.   2. Macrocytic anemia: -CBC today shows hemoglobin 9.7 with MCV of 112.  Platelet count and white count was normal.  3. Vitamin D deficiency: -Continue vitamin D 50,000 units weekly.  Recheck in 2 to 3 months.  Orders placed this encounter:  Orders Placed This Encounter  Procedures  . CBC with Differential/Platelet  . Comprehensive metabolic panel  . Ferritin  . Iron and TIBC     Derek Jack, MD [place] [phone]   I, Milinda Antis, am acting as a scribe for Dr. Sanda Linger.  I, Derek Jack MD, have reviewed the above documentation for accuracy and completeness, and I agree with the above.

## 2020-02-09 NOTE — Patient Instructions (Signed)
[place] at [facility] Discharge Instructions  You were seen today by Dr. Delton Coombes. He went over your recent results. Your ferritin has increased from 831 to 944. Start taking 4 tablets of Deferasirox at 8 PM; you may snack an hour after taking the tablets. Dr. Delton Coombes will see you back in 1 month for labs and follow up.   Thank you for choosing [place] at [facility] to provide your oncology and hematology care.  To afford each patient quality time with our provider, please arrive at least 15 minutes before your scheduled appointment time.   If you have a lab appointment with the [facility] please come in thru the Main Entrance and check in at the main information desk  You need to re-schedule your appointment should you arrive 10 or more minutes late.  We strive to give you quality time with our providers, and arriving late affects you and other patients whose appointments are after yours.  Also, if you no show three or more times for appointments you may be dismissed from the clinic at the providers discretion.     Again, thank you for choosing [facility].  Our hope is that these requests will decrease the amount of time that you wait before being seen by our physicians.       _____________________________________________________________  Should you have questions after your visit to [facility], please contact our office at [phone] between the hours of 8:00 a.m. and 4:30 p.m.  Voicemails left after 4:00 p.m. will not be returned until the following business day.  For prescription refill requests, have your pharmacy contact our office and allow 72 hours.    Cancer Center Support Programs:   > Cancer Support Group  2nd Tuesday of the month 1pm-2pm, Journey Room

## 2020-02-10 MED FILL — DEFERASIROX 180 MG TABS: 180 | 30 days supply | Qty: 120 | Fill #0

## 2020-03-02 ENCOUNTER — Other Ambulatory Visit: Payer: Self-pay | Admitting: Internal Medicine

## 2020-03-02 NOTE — Telephone Encounter (Signed)
Last filled 02-02-20 #60 Last OV 12-02-19 Next OV 12-03-20 [facility] [address] [facility] [employer]

## 2020-03-08 ENCOUNTER — Other Ambulatory Visit (HOSPITAL_COMMUNITY): Payer: Self-pay | Admitting: Hematology

## 2020-03-10 ENCOUNTER — Inpatient Hospital Stay (HOSPITAL_COMMUNITY): Payer: Medicare PPO | Admitting: Hematology

## 2020-03-10 ENCOUNTER — Other Ambulatory Visit: Payer: Self-pay

## 2020-03-10 ENCOUNTER — Inpatient Hospital Stay (HOSPITAL_COMMUNITY): Payer: Medicare PPO | Attending: Hematology

## 2020-03-10 DIAGNOSIS — I2510 Atherosclerotic heart disease of native coronary artery without angina pectoris: Secondary | ICD-10-CM | POA: Insufficient documentation

## 2020-03-10 DIAGNOSIS — Z7982 Long term (current) use of aspirin: Secondary | ICD-10-CM | POA: Insufficient documentation

## 2020-03-10 DIAGNOSIS — R5383 Other fatigue: Secondary | ICD-10-CM | POA: Insufficient documentation

## 2020-03-10 DIAGNOSIS — E559 Vitamin D deficiency, unspecified: Secondary | ICD-10-CM | POA: Diagnosis not present

## 2020-03-10 DIAGNOSIS — M1990 Unspecified osteoarthritis, unspecified site: Secondary | ICD-10-CM | POA: Diagnosis not present

## 2020-03-10 DIAGNOSIS — E785 Hyperlipidemia, unspecified: Secondary | ICD-10-CM | POA: Insufficient documentation

## 2020-03-10 DIAGNOSIS — Z801 Family history of malignant neoplasm of trachea, bronchus and lung: Secondary | ICD-10-CM | POA: Diagnosis not present

## 2020-03-10 DIAGNOSIS — R200 Anesthesia of skin: Secondary | ICD-10-CM | POA: Diagnosis not present

## 2020-03-10 DIAGNOSIS — Z8719 Personal history of other diseases of the digestive system: Secondary | ICD-10-CM | POA: Insufficient documentation

## 2020-03-10 DIAGNOSIS — I739 Peripheral vascular disease, unspecified: Secondary | ICD-10-CM | POA: Diagnosis not present

## 2020-03-10 DIAGNOSIS — D539 Nutritional anemia, unspecified: Secondary | ICD-10-CM | POA: Diagnosis not present

## 2020-03-10 DIAGNOSIS — E538 Deficiency of other specified B group vitamins: Secondary | ICD-10-CM | POA: Diagnosis not present

## 2020-03-10 DIAGNOSIS — Z87891 Personal history of nicotine dependence: Secondary | ICD-10-CM | POA: Insufficient documentation

## 2020-03-10 DIAGNOSIS — K219 Gastro-esophageal reflux disease without esophagitis: Secondary | ICD-10-CM | POA: Diagnosis not present

## 2020-03-10 LAB — CBC WITH DIFFERENTIAL/PLATELET
Abs Immature Granulocytes: 0.02 10*3/uL (ref 0.00–0.07)
Basophils Absolute: 0.1 10*3/uL (ref 0.0–0.1)
Basophils Relative: 1 %
Eosinophils Absolute: 0.2 10*3/uL (ref 0.0–0.5)
Eosinophils Relative: 2 %
HCT: 30 % — ABNORMAL LOW (ref 39.0–52.0)
Hemoglobin: 9.7 g/dL — ABNORMAL LOW (ref 13.0–17.0)
Immature Granulocytes: 0 %
Lymphocytes Relative: 29 %
Lymphs Abs: 3 10*3/uL (ref 0.7–4.0)
MCH: 37.3 pg — ABNORMAL HIGH (ref 26.0–34.0)
MCHC: 32.3 g/dL (ref 30.0–36.0)
MCV: 115.4 fL — ABNORMAL HIGH (ref 80.0–100.0)
Monocytes Absolute: 1.1 10*3/uL — ABNORMAL HIGH (ref 0.1–1.0)
Monocytes Relative: 11 %
Neutro Abs: 5.9 10*3/uL (ref 1.7–7.7)
Neutrophils Relative %: 57 %
Platelets: 319 10*3/uL (ref 150–400)
RBC: 2.6 MIL/uL — ABNORMAL LOW (ref 4.22–5.81)
RDW: 14.9 % (ref 11.5–15.5)
WBC: 10.4 10*3/uL (ref 4.0–10.5)
nRBC: 0.3 % — ABNORMAL HIGH (ref 0.0–0.2)

## 2020-03-10 LAB — COMPREHENSIVE METABOLIC PANEL
ALT: 44 U/L (ref 0–44)
AST: 27 U/L (ref 15–41)
Albumin: 4 g/dL (ref 3.5–5.0)
Alkaline Phosphatase: 34 U/L — ABNORMAL LOW (ref 38–126)
Anion gap: 9 (ref 5–15)
BUN: 23 mg/dL (ref 8–23)
CO2: 23 mmol/L (ref 22–32)
Calcium: 9 mg/dL (ref 8.9–10.3)
Chloride: 102 mmol/L (ref 98–111)
Creatinine, Ser: 1.14 mg/dL (ref 0.61–1.24)
GFR, Estimated: >60 mL/min (ref >60)
Glucose, Bld: 106 mg/dL — ABNORMAL HIGH (ref 70–99)
Potassium: 3.7 mmol/L (ref 3.5–5.1)
Sodium: 134 mmol/L — ABNORMAL LOW (ref 135–145)
Total Bilirubin: 0.5 mg/dL (ref 0.3–1.2)
Total Protein: 7.3 g/dL (ref 6.5–8.1)

## 2020-03-10 LAB — IRON AND TIBC
Iron: 169 ug/dL (ref 45–182)
Saturation Ratios: 64 % — ABNORMAL HIGH (ref 17.9–39.5)
TIBC: 265 ug/dL (ref 250–450)
UIBC: 96 ug/dL

## 2020-03-10 LAB — FERRITIN: Ferritin: 771 ng/mL — ABNORMAL HIGH (ref 24–336)

## 2020-03-10 MED FILL — DEFERASIROX 180 MG TABS: 180 | 30 days supply | Qty: 120 | Fill #0

## 2020-03-10 NOTE — Patient Instructions (Signed)
[place] at [facility] Discharge Instructions  You were seen today by Dr. Delton Coombes. He went over your recent results. Drink plenty of water daily to flush out your kidneys. Dr. Delton Coombes will see you back in 4 weeks for labs and follow up.   Thank you for choosing [place] at [facility] to provide your oncology and hematology care.  To afford each patient quality time with our provider, please arrive at least 15 minutes before your scheduled appointment time.   If you have a lab appointment with the [facility] please come in thru the Main Entrance and check in at the main information desk  You need to re-schedule your appointment should you arrive 10 or more minutes late.  We strive to give you quality time with our providers, and arriving late affects you and other patients whose appointments are after yours.  Also, if you no show three or more times for appointments you may be dismissed from the clinic at the providers discretion.     Again, thank you for choosing [facility].  Our hope is that these requests will decrease the amount of time that you wait before being seen by our physicians.       _____________________________________________________________  Should you have questions after your visit to [facility], please contact our office at [phone] between the hours of 8:00 a.m. and 4:30 p.m.  Voicemails left after 4:00 p.m. will not be returned until the following business day.  For prescription refill requests, have your pharmacy contact our office and allow 72 hours.    Cancer Center Support Programs:   > Cancer Support Group  2nd Tuesday of the month 1pm-2pm, Journey Room

## 2020-03-10 NOTE — Progress Notes (Signed)
Aaron Sexton, [place] [address]   CLINIC:  Medical Oncology/Hematology  PCP:  Venia Carbon, MD [place] / [place] [address]  [phone]  REASON FOR VISIT:  Follow-up for hereditary hemochromatosis  PRIOR THERAPY: None  CURRENT THERAPY: Deferasirox 720 mg daily  INTERVAL HISTORY:  [name], a 74 y.o. 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 [name] was last seen on 02/09/2020.  Today he is accompanied by his wife and he reports feeling well. He is tolerating 4 tablets of deferasirox well; he takes them at [facility]. According to his wife, he does not drink enough water. The numbness in his fingertips and toes is stable.   REVIEW OF SYSTEMS:  Review of Systems  Constitutional: Positive for fatigue (40%). Negative for appetite change.  Neurological: Positive for numbness (fingertips & toes stable).  All other systems reviewed and are negative.   PAST MEDICAL/SURGICAL HISTORY:  Past Medical History:  Diagnosis Date  . Anemia   . Arteriosclerotic cardiovascular disease (ASCVD) 1993   Critical RCA disease in 1993 treated with PTCA  . Cerebrovascular disease    Moderate ASVD without focal stenosis in 01/2007  . Colon polyps 3/12   single 93mm polyp--tubular adenoma  . Diverticulosis 2012   found on colonoscopy  . Erectile dysfunction   . Family history of adverse reaction to anesthesia    difficult for son & sistor to wake   . GERD (gastroesophageal reflux disease)   . Hematochezia 05/24/2010  . Hyperlipidemia   . Osteoarthritis    knees/hands-Dr Ronnie Derby  . Overweight(278.02)   . Peripheral vascular disease ([facility]) 03/11/2010   Moderate SFA stenosis; history of claudication  . Pneumonia   . Rosacea   . Tobacco abuse, in remission    30-40 pack years discontinued in 1993  . Vitamin B12 deficiency    Past Surgical History:  Procedure Laterality Date  . APPENDECTOMY    .  COLONOSCOPY W/ POLYPECTOMY  2012  . ILIAC VEIN ANGIOPLASTY / STENTING Right 08/09/2015  . PERIPHERAL VASCULAR CATHETERIZATION Bilateral 06/21/2015   Procedure: Lower Extremity Angiography;  Surgeon: Lorretta Harp, MD;  Location: [place] [facility];  Service: Cardiovascular;  Laterality: Bilateral;  . PERIPHERAL VASCULAR CATHETERIZATION N/A 06/21/2015   Procedure: Abdominal Aortogram;  Surgeon: Lorretta Harp, MD;  Location: [place] [facility];  Service: Cardiovascular;  Laterality: N/A;  . PERIPHERAL VASCULAR CATHETERIZATION N/A 07/26/2015   Procedure: Lower Extremity Angiography;  Surgeon: Lorretta Harp, MD;  Location: [place] [facility];  Service: Cardiovascular;  Laterality: N/A;  . PERIPHERAL VASCULAR CATHETERIZATION N/A 08/09/2015   Procedure: Lower Extremity Angiography;  Surgeon: Lorretta Harp, MD;  Location: [place] [facility];  Service: Cardiovascular;  Laterality: N/A;  . PERIPHERAL VASCULAR CATHETERIZATION  08/09/2015   Procedure: Peripheral Vascular Intervention;  Surgeon: Lorretta Harp, MD;  Location: [place] [facility];  Service: Cardiovascular;;  rt ext. iliac atherectomy and stent  . PERIPHERAL VASCULAR CATHETERIZATION N/A 09/20/2015   Procedure: Lower Extremity Angiography;  Surgeon: Lorretta Harp, MD;  Location: [facility];  Service: Cardiovascular;  Laterality: N/A;  . PERIPHERAL VASCULAR CATHETERIZATION Right 09/20/2015   Procedure: Peripheral Vascular Intervention;  Surgeon: Lorretta Harp, MD;  Location: [place] [facility];  Service: Cardiovascular;  Laterality: Right;  SFA  . ROTATOR CUFF REPAIR Left 7/15   Dr Ronnie Derby    SOCIAL HISTORY:  Social History  Socioeconomic History  . Marital status: Married    Spouse name: Not on file  . Number of children: 3  . Years of education: Not on file  . Highest education level: Not on file  Occupational History  . 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 Smoking status: Former Smoker    Packs/day: 1.00    Years: 30.00    Pack years: 30.00    Quit date: 05/16/1990    Years since quitting: 29.8  . Smokeless tobacco: Never Used  . Tobacco comment: Quit in 1993  Vaping Use  . Vaping Use: Never used  Substance and Sexual Activity  . Alcohol use: No    Alcohol/week: 0.0 standard drinks  . Drug use: No  . Sexual activity: Not on file  Other Topics Concern  . Not on file  Social History Narrative   Has living will   Wife is health care POA---alternate is son Monica Martinez   Would accept resuscitation but no prolonged artificial ventilation.   No tube feeds if cognitively unaware   Social Determinants of Health   Financial Resource Strain: Low Risk   . Difficulty of Paying Living Expenses: Not hard at all  Food Insecurity: No Food Insecurity  . Worried About Charity fundraiser in the Last Year: Never true  . Ran Out of Food in the Last Year: Never true  Transportation Needs: No Transportation Needs  . Lack of Transportation (Medical): No  . Lack of Transportation (Non-Medical): No  Physical Activity: Inactive  . Days of Exercise per Week: 0 days  . Minutes of Exercise per Session: 0 min  Stress: No Stress Concern Present  . Feeling of Stress : Not at all  Social Connections: Moderately Integrated  . Frequency of Communication with Friends and Family: More than three times a week  . Frequency of Social Gatherings with Friends and Family: Never  . Attends Religious Services: More than 4 times per year  . Active Member of Clubs or Organizations: No  . Attends Archivist Meetings: Never  . Marital Status: Married  Human resources officer Violence: Not At Risk  . Fear of Current or Ex-Partner: No  . Emotionally Abused: No  . Physically Abused: No  . Sexually Abused: No    FAMILY HISTORY:  Family History  Problem Relation Age of Onset  . Hypertension Father        And siblings  . Heart disease Father        And  second-degree relatives  . Transient ischemic attack Father   . Lung cancer Mother   . Diabetes Mother   . Brain cancer Sister   . Diabetes Brother   . Atrial fibrillation Brother   . Heart disease Sister   . Heart disease Sister   . Thyroid disease Sister   . Ulcers Son   . Diabetes Son   . Diabetes Son     CURRENT MEDICATIONS:  Current Outpatient Medications  Medication Sig Dispense Refill  . aspirin 81 MG tablet Take 81 mg by mouth daily.      . chlorthalidone (HYGROTON) 25 MG tablet TAKE 1/2 TABLET(12.5 MG) BY MOUTH DAILY 45 tablet 3  . cimetidine (TAGAMET) 400 MG tablet Take 400 mg by mouth 2 (two) times daily as needed (indigestion).     . clopidogrel (PLAVIX) 75 MG tablet TAKE 1 TABLET(75 MG) BY MOUTH DAILY WITH BREAKFAST 30 tablet 11  .  Cyanocobalamin (VITAMIN B-12) 500 MCG SUBL Place 500 mcg under the tongue daily.    . Deferasirox 180 MG TABS TAKE 4 TABLETS BY MOUTH DAILY 120 tablet 0  . ergocalciferol (VITAMIN D2) 1.25 MG (50000 UT) capsule Take 1 capsule (50,000 Units total) by mouth once a week. 12 capsule 2  . losartan (COZAAR) 25 MG tablet TAKE ONE TABLET BY MOUTH EVERY DAY 90 tablet 3  . metoprolol succinate (TOPROL-XL) 100 MG 24 hr tablet Take 1 tablet (100 mg total) by mouth daily. Take with or immediately following a meal. 90 tablet 3  . Omega-3 Fatty Acids (FISH OIL PO) Take 1,000 mg by mouth daily.     . pravastatin (PRAVACHOL) 40 MG tablet TAKE 1 TABLET(40 MG) BY MOUTH DAILY 90 tablet 3  . Probiotic Product (PROBIOTIC PO) Take by mouth.    . traMADol (ULTRAM) 50 MG tablet TAKE 1 TABLET(50 MG) BY MOUTH EVERY 8 HOURS AS NEEDED 60 tablet 0  . nitroGLYCERIN (NITROSTAT) 0.4 MG SL tablet Place 1 tablet (0.4 mg total) under the tongue every 5 (five) minutes as needed for chest pain. (Patient not taking: Reported on 03/10/2020) 25 tablet 3   No current facility-administered medications for this visit.    ALLERGIES:  Allergies  Allergen Reactions  . Diltiazem Hcl  Hives and Rash  . Cardizem [Diltiazem Hcl]     PHYSICAL EXAM:  Performance status (ECOG): 0 - Asymptomatic  Vitals:   03/10/20 1538  BP: (!) 144/78  Pulse: 83  Resp: 18  Temp: (!) 97.5 F (36.4 C)  SpO2: 97%   Wt Readings from Last 3 Encounters:  03/10/20 210 lb 8 oz (95.5 kg)  02/09/20 210 lb (95.3 kg)  01/07/20 209 lb 14.1 oz (95.2 kg)   Physical Exam Vitals reviewed.  Constitutional:      Appearance: Normal appearance. He is obese.  Cardiovascular:     Rate and Rhythm: Normal rate and regular rhythm.     Pulses: Normal pulses.     Heart sounds: Normal heart sounds.  Pulmonary:     Effort: Pulmonary effort is normal.     Breath sounds: Normal breath sounds.  Abdominal:     Palpations: Abdomen is soft. There is no hepatomegaly or mass.     Tenderness: There is no abdominal tenderness.     Hernia: No hernia is present.  Musculoskeletal:     Right lower leg: No edema.     Left lower leg: No edema.  Neurological:     General: No focal deficit present.     Mental Status: He is alert and oriented to person, place, and time.  Psychiatric:        Mood and Affect: Mood normal.        Behavior: Behavior normal.     LABORATORY DATA:  I have reviewed the labs as listed.  CBC Latest Ref Rng & Units 03/10/2020 02/09/2020 01/07/2020  WBC 4.0 - 10.5 K/uL 10.4 8.5 9.4  Hemoglobin 13.0 - 17.0 g/dL 9.7(L) 9.7(L) 10.4(L)  Hematocrit 39 - 52 % 30.0(L) 29.3(L) 31.2(L)  Platelets 150 - 400 K/uL 319 311 322   CMP Latest Ref Rng & Units 03/10/2020 02/09/2020 01/07/2020  Glucose 70 - 99 mg/dL 106(H) 93 111(H)  BUN 8 - 23 mg/dL 23 23 21   Creatinine 0.61 - 1.24 mg/dL 1.14 1.00 0.97  Sodium 135 - 145 mmol/L 134(L) 136 136  Potassium 3.5 - 5.1 mmol/L 3.7 3.8 3.7  Chloride 98 - 111 mmol/L 102 103  103  CO2 22 - 32 mmol/L 23 24 24   Calcium 8.9 - 10.3 mg/dL 9.0 8.9 9.1  Total Protein 6.5 - 8.1 g/dL 7.3 6.9 7.8  Total Bilirubin 0.3 - 1.2 mg/dL 0.5 0.6 0.4  Alkaline Phos 38 - 126 U/L 34(L)  34(L) 37(L)  AST 15 - 41 U/L 27 26 17   ALT 0 - 44 U/L 44 43 21      Component Value Date/Time   RBC 2.60 (L) 03/10/2020 1507   MCV 115.4 (H) 03/10/2020 1507   MCH 37.3 (H) 03/10/2020 1507   MCHC 32.3 03/10/2020 1507   RDW 14.9 03/10/2020 1507   LYMPHSABS 3.0 03/10/2020 1507   MONOABS 1.1 (H) 03/10/2020 1507   EOSABS 0.2 03/10/2020 1507   BASOSABS 0.1 03/10/2020 1507   Lab Results  Component Value Date   TIBC 265 03/10/2020   TIBC 251 02/09/2020   TIBC 239 (L) 11/12/2019   FERRITIN 771 (H) 03/10/2020   FERRITIN 944 (H) 02/09/2020   FERRITIN 831 (H) 11/12/2019   IRONPCTSAT 64 (H) 03/10/2020   IRONPCTSAT 68 (H) 02/09/2020   IRONPCTSAT 61 (H) 11/12/2019    DIAGNOSTIC IMAGING:  I have independently reviewed the scans and discussed with the patient. No results found.   ASSESSMENT:  1. Hereditary hemochromatosis: -Patient seen for elevated ferritin levels. -EMR evaluation shows hemochromatosis testing on 01/27/2019 which showed heterozygosity for C282Y and H63D variants. -Given the elevated ferritin levels above 1000 and the results of genetic testing, this is compatible with hereditary hemochromatosis. -Patient does have arthritis of the small joints of the hands and arthritis of the back. -No history of CHF but has CAD. No diabetes. -MRI of the liver on 12/11/2019 shows estimated liver iron concentration approximately 7.7 mg/g compatible with moderate liver iron. Estimated fat fraction ranges from 7-12%, more pronounced in the right lobe compared to left hepatic lobe. No focal hepatic lesions seen. No morphological changes of significant liver disease. -Jadenu 540 mg daily started on 12/24/2019, dose increased to 720 mg daily on 02/09/2020.  2. Macrocytic anemia: -Colonoscopy by Dr. Loletha Carrow on 02/17/2019 shows diverticulosis in the left colon, 12 mm polyp in the cecum, 4 to 6 mm polyp in the ascending colon and 5 mm polyp in the descending colon. Pathology was consistent with  tubular adenomas.   PLAN:  1. Hereditary hemochromatosis: -Jadenu dose increased to 720 mg daily on 02/09/2020. -He has tolerated it very well.  Denied any GI side effects. -Creatinine has gone up to 1.14.  Encouraged increased water intake. -Reviewed labs from today.  Ferritin improved to 771 from 944.  Percent saturation improved to 64 from 68.  CBC shows stable hemoglobin, white count and platelets. -RTC 1 month with repeat labs.  Continue at the same dose.   2. Macrocytic anemia: -CBC today shows hemoglobin 9.7 with MCV 115.  White count and platelet count is normal.  3. Vitamin D deficiency: -Continue vitamin D 50,000 units weekly.  Recheck vitamin D level in 2 to 3 months.  Orders placed this encounter:  No orders of the defined types were placed in this encounter.    Derek Jack, MD [place] [phone]   I, Milinda Antis, am acting as a scribe for Dr. Sanda Linger.  I, Derek Jack MD, have reviewed the above documentation for accuracy and completeness, and I agree with the above.

## 2020-03-11 ENCOUNTER — Other Ambulatory Visit (HOSPITAL_COMMUNITY): Payer: Medicare PPO

## 2020-03-11 ENCOUNTER — Ambulatory Visit (HOSPITAL_COMMUNITY): Payer: Medicare PPO | Admitting: Hematology

## 2020-04-02 ENCOUNTER — Other Ambulatory Visit: Payer: Self-pay | Admitting: Internal Medicine

## 2020-04-04 NOTE — Telephone Encounter (Signed)
Last filled 03-03-20 #60 Last OV 12-02-19 Next OV 12-10-20 [facility] [address] [employer] [address]

## 2020-04-05 ENCOUNTER — Other Ambulatory Visit (HOSPITAL_COMMUNITY): Payer: Self-pay | Admitting: Hematology

## 2020-04-05 ENCOUNTER — Telehealth: Payer: Self-pay

## 2020-04-05 NOTE — Telephone Encounter (Signed)
Vm from pt left at triage.  Pt is requesting refill for tramadol but wants the strength increased.  States current strength does not help as much anymore.

## 2020-04-05 NOTE — Telephone Encounter (Signed)
Spoke to pt. He has an appt tomorrow and he said he would like to discuss it then.

## 2020-04-05 NOTE — Telephone Encounter (Signed)
He can try 2 at a time if the pain is bad---but I cannot increase your prescription amount for a controlled substance without an appointment to review

## 2020-04-06 ENCOUNTER — Ambulatory Visit: Payer: Medicare PPO | Admitting: Internal Medicine

## 2020-04-06 ENCOUNTER — Other Ambulatory Visit: Payer: Self-pay

## 2020-04-06 ENCOUNTER — Encounter: Payer: Self-pay | Admitting: Internal Medicine

## 2020-04-06 VITALS — BP 128/66 | HR 74 | Temp 98.3°F | Ht 68.0 in | Wt 211.0 lb

## 2020-04-06 DIAGNOSIS — M159 Polyosteoarthritis, unspecified: Secondary | ICD-10-CM | POA: Diagnosis not present

## 2020-04-06 MED ORDER — TRAMADOL HCL 50 MG PO TABS: ORAL_TABLET | ORAL | 0 refills | Status: DC

## 2020-04-06 NOTE — Assessment & Plan Note (Signed)
More back pain No synovitis in hands, etc Will increase tramadol to 50 tid prn Add tylenol 500mg  with each dose

## 2020-04-06 NOTE — Patient Instructions (Signed)
Go ahead take a tylenol 500mg  with each dose of the tramadol.

## 2020-04-06 NOTE — Progress Notes (Signed)
Subjective:    Patient ID: Aaron Sexton, male    DOB: 1945/06/13, 74 y.o.   MRN: [id number]  HPI Here due to worsening problems with his back pain With wife This visit occurred during the SARS-CoV-2 public health emergency.  Safety protocols were in place, including screening questions prior to the visit, additional usage of staff PPE, and extensive cleaning of exam room while observing appropriate contact time as indicated for disinfecting solutions.   Has noticed more back problems for a few months Some more generalized pain hands, knees, elbows No swelling Does note some AM stiffness---better in a few minutes (after washing hands)  Has run out of tramadol due to using more Some radiation of pain to legs Some weakness in legs--he relates this to vascular (claudication)  Tramadol does help Hasn't been taking tylenol---told not to take it by Dr Galen Manila to wear off within 8 hours  Current Outpatient Medications on File Prior to Visit  Medication Sig Dispense Refill  . aspirin 81 MG tablet Take 81 mg by mouth daily.      . chlorthalidone (HYGROTON) 25 MG tablet TAKE 1/2 TABLET(12.5 MG) BY MOUTH DAILY 45 tablet 3  . cimetidine (TAGAMET) 400 MG tablet Take 400 mg by mouth 2 (two) times daily as needed (indigestion).     . clopidogrel (PLAVIX) 75 MG tablet TAKE 1 TABLET(75 MG) BY MOUTH DAILY WITH BREAKFAST 30 tablet 11  . Cyanocobalamin (VITAMIN B-12) 500 MCG SUBL Place 500 mcg under the tongue daily.    . Deferasirox 180 MG TABS TAKE 4 TABLETS BY MOUTH DAILY 120 tablet 0  . ergocalciferol (VITAMIN D2) 1.25 MG (50000 UT) capsule Take 1 capsule (50,000 Units total) by mouth once a week. 12 capsule 2  . losartan (COZAAR) 25 MG tablet TAKE ONE TABLET BY MOUTH EVERY DAY 90 tablet 3  . metoprolol succinate (TOPROL-XL) 100 MG 24 hr tablet Take 1 tablet (100 mg total) by mouth daily. Take with or immediately following a meal. 90 tablet 3  . nitroGLYCERIN (NITROSTAT) 0.4 MG SL tablet  Place 1 tablet (0.4 mg total) under the tongue every 5 (five) minutes as needed for chest pain. 25 tablet 3  . Omega-3 Fatty Acids (FISH OIL PO) Take 1,000 mg by mouth daily.     . pravastatin (PRAVACHOL) 40 MG tablet TAKE 1 TABLET(40 MG) BY MOUTH DAILY 90 tablet 3  . Probiotic Product (PROBIOTIC PO) Take by mouth.    . traMADol (ULTRAM) 50 MG tablet TAKE 1 TABLET(50 MG) BY MOUTH EVERY 8 HOURS AS NEEDED 60 tablet 0   No current facility-administered medications on file prior to visit.    Allergies  Allergen Reactions  . Diltiazem Hcl Hives and Rash  . Cardizem [Diltiazem Hcl]     Past Medical History:  Diagnosis Date  . Anemia   . Arteriosclerotic cardiovascular disease (ASCVD) 1993   Critical RCA disease in 1993 treated with PTCA  . Cerebrovascular disease    Moderate ASVD without focal stenosis in 01/2007  . Colon polyps 3/12   single 2mm polyp--tubular adenoma  . Diverticulosis 2012   found on colonoscopy  . Erectile dysfunction   . Family history of adverse reaction to anesthesia    difficult for son & sistor to wake   . GERD (gastroesophageal reflux disease)   . Hematochezia 05/24/2010  . Hyperlipidemia   . Osteoarthritis    knees/hands-Dr Sherlean Foot  . Overweight(278.02)   . Peripheral vascular disease ([facility]) 03/11/2010   Moderate  SFA stenosis; history of claudication  . Pneumonia   . Rosacea   . Tobacco abuse, in remission    30-40 pack years discontinued in 1993  . Vitamin B12 deficiency     Past Surgical History:  Procedure Laterality Date  . APPENDECTOMY    . COLONOSCOPY W/ POLYPECTOMY  2012  . ILIAC VEIN ANGIOPLASTY / STENTING Right 08/09/2015  . PERIPHERAL VASCULAR CATHETERIZATION Bilateral 06/21/2015   Procedure: Lower Extremity Angiography;  Surgeon: Runell Gess, MD;  [facility] INVAS[facility];  Service: Cardiovascular;  Laterality: Bilateral;  . PERIPHERAL VASCULAR CATHETERIZATION N/A 06/21/2015   Procedure: Abdominal Aortogram;  Surgeon: Runell Gess, MD;  Location: [facility] INVASIVE [facility];  Service: Cardiovascular;  Laterality: N/A;  . PERIPHERAL VASCULAR CATHETERIZATION N/A 07/26/2015   Procedure: Lower Extremity Angiography;  Surgeon: Runell Gess, MD;  Location: [facility] INVASIVE [facility] LAB;  Service: Cardiovascular;  Laterality: N/A;  . PERIPHERAL VASCULAR CATHETERIZATION N/A 08/09/2015   Procedure: Lower Extremity Angiography;  Surgeon: Runell Gess, MD;  Location: [facility] INVASIVE CV LAB;  Service: Cardiovascular;  Laterality: N/A;  . PERIPHERAL VASCULAR CATHETERIZATION  08/09/2015   Procedure: Peripheral Vascular Intervention;  Surgeon: Runell Gess, MD;  Location: [facility] INVASIVE [facility];  Service: Cardiovascular;;  rt ext. iliac atherectomy and stent  . PERIPHERAL VASCULAR CATHETERIZATION N/A 09/20/2015   Procedure: Lower Extremity Angiography;  Surgeon: Runell Gess, MD;  Location: [facility] INVASIVE CV LAB;  Service: Cardiovascular;  Laterality: N/A;  . PERIPHERAL VASCULAR CATHETERIZATION Right 09/20/2015   Procedure: Peripheral Vascular Intervention;  Surgeon: Runell Gess, MD;  Location: [facility] INVASIVE [facility] LAB;  Service: Cardiovascular;  Laterality: Right;  SFA  . ROTATOR CUFF REPAIR Left 7/15   Dr Sherlean Foot    Family History  Problem Relation Age of Onset  . Hypertension Father        And siblings  . Heart disease Father        And second-degree relatives  . Transient ischemic attack Father   . Lung cancer Mother   . Diabetes Mother   . Brain cancer Sister   . Diabetes Brother   . Atrial fibrillation Brother   . Heart disease Sister   . Heart disease Sister   . Thyroid disease Sister   . Ulcers Son   . Diabetes Son   . Diabetes Son     Social History   Socioeconomic History  . Marital status: Married    Spouse name: Not on file  . Number of children: 3  . Years of education: Not on file  . Highest education level: Not on file  Occupational History  . 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 Smoking status: Former Smoker    Packs/day: 1.00    Years: 30.00    Pack years: 30.00    Quit date: 05/16/1990    Years since quitting: 29.9  . Smokeless tobacco: Never Used  . Tobacco comment: Quit in 1993  Vaping Use  . Vaping Use: Never used  Substance and Sexual Activity  . Alcohol use: No    Alcohol/week: 0.0 standard drinks  . Drug use: No  . Sexual activity: Not on file  Other Topics Concern  . Not on file  Social History Narrative   Has living will   Wife is health care POA---alternate is son Algernon Huxley   Would accept resuscitation but no prolonged artificial ventilation.   No tube  feeds if cognitively unaware   Social Determinants of Health   Financial Resource Strain: Low Risk   . Difficulty of Paying Living Expenses: Not hard at all  Food Insecurity: No Food Insecurity  . Worried About Programme researcher, broadcasting/film/video in the Last Year: Never true  . Ran Out of Food in the Last Year: Never true  Transportation Needs: No Transportation Needs  . Lack of Transportation (Medical): No  . Lack of Transportation (Non-Medical): No  Physical Activity: Inactive  . Days of Exercise per Week: 0 days  . Minutes of Exercise per Session: 0 min  Stress: No Stress Concern Present  . Feeling of Stress : Not at all  Social Connections: Moderately Integrated  . Frequency of Communication with Friends and Family: More than three times a week  . Frequency of Social Gatherings with Friends and Family: Never  . Attends Religious Services: More than 4 times per year  . Active Member of Clubs or Organizations: No  . Attends Banker Meetings: Never  . Marital Status: Married  Catering manager Violence: Not At Risk  . Fear of Current or Ex-Partner: No  . Emotionally Abused: No  . Physically Abused: No  . Sexually Abused: No   Review of Systems Grieving for son---died from surgery complications 10/7 Not sleeping well since being on iron chelation therapy      Objective:   Physical Exam Constitutional:      Appearance: Normal appearance.  Musculoskeletal:     Comments: No active synovitis in hands/elbows  Neurological:     Mental Status: He is alert.     Comments: Gait slow but not antalgic No focal leg weakness  Psychiatric:        Mood and Affect: Mood normal.        Behavior: Behavior normal.            Assessment & Plan:

## 2020-04-07 ENCOUNTER — Inpatient Hospital Stay (HOSPITAL_COMMUNITY): Payer: Medicare PPO | Attending: Hematology | Admitting: Hematology

## 2020-04-07 ENCOUNTER — Inpatient Hospital Stay (HOSPITAL_COMMUNITY): Payer: Medicare PPO

## 2020-04-07 ENCOUNTER — Other Ambulatory Visit: Payer: Self-pay

## 2020-04-07 DIAGNOSIS — Z808 Family history of malignant neoplasm of other organs or systems: Secondary | ICD-10-CM | POA: Diagnosis not present

## 2020-04-07 DIAGNOSIS — I739 Peripheral vascular disease, unspecified: Secondary | ICD-10-CM | POA: Diagnosis not present

## 2020-04-07 DIAGNOSIS — N529 Male erectile dysfunction, unspecified: Secondary | ICD-10-CM | POA: Insufficient documentation

## 2020-04-07 DIAGNOSIS — D539 Nutritional anemia, unspecified: Secondary | ICD-10-CM | POA: Insufficient documentation

## 2020-04-07 DIAGNOSIS — Z801 Family history of malignant neoplasm of trachea, bronchus and lung: Secondary | ICD-10-CM | POA: Diagnosis not present

## 2020-04-07 DIAGNOSIS — M1990 Unspecified osteoarthritis, unspecified site: Secondary | ICD-10-CM | POA: Diagnosis not present

## 2020-04-07 DIAGNOSIS — R5383 Other fatigue: Secondary | ICD-10-CM | POA: Insufficient documentation

## 2020-04-07 DIAGNOSIS — Z86010 Personal history of colonic polyps: Secondary | ICD-10-CM | POA: Diagnosis not present

## 2020-04-07 DIAGNOSIS — K219 Gastro-esophageal reflux disease without esophagitis: Secondary | ICD-10-CM | POA: Insufficient documentation

## 2020-04-07 DIAGNOSIS — E785 Hyperlipidemia, unspecified: Secondary | ICD-10-CM | POA: Diagnosis not present

## 2020-04-07 DIAGNOSIS — Z79899 Other long term (current) drug therapy: Secondary | ICD-10-CM | POA: Diagnosis not present

## 2020-04-07 DIAGNOSIS — E559 Vitamin D deficiency, unspecified: Secondary | ICD-10-CM | POA: Insufficient documentation

## 2020-04-07 DIAGNOSIS — E538 Deficiency of other specified B group vitamins: Secondary | ICD-10-CM | POA: Diagnosis not present

## 2020-04-07 DIAGNOSIS — I2510 Atherosclerotic heart disease of native coronary artery without angina pectoris: Secondary | ICD-10-CM | POA: Insufficient documentation

## 2020-04-07 DIAGNOSIS — Z87891 Personal history of nicotine dependence: Secondary | ICD-10-CM | POA: Insufficient documentation

## 2020-04-07 LAB — COMPREHENSIVE METABOLIC PANEL
ALT: 35 U/L (ref 0–44)
AST: 24 U/L (ref 15–41)
Albumin: 4 g/dL (ref 3.5–5.0)
Alkaline Phosphatase: 33 U/L — ABNORMAL LOW (ref 38–126)
Anion gap: 10 (ref 5–15)
BUN: 31 mg/dL — ABNORMAL HIGH (ref 8–23)
CO2: 23 mmol/L (ref 22–32)
Calcium: 8.9 mg/dL (ref 8.9–10.3)
Chloride: 103 mmol/L (ref 98–111)
Creatinine, Ser: 1.16 mg/dL (ref 0.61–1.24)
GFR, Estimated: >60 mL/min (ref >60)
Glucose, Bld: 96 mg/dL (ref 70–99)
Potassium: 3.7 mmol/L (ref 3.5–5.1)
Sodium: 136 mmol/L (ref 135–145)
Total Bilirubin: 0.6 mg/dL (ref 0.3–1.2)
Total Protein: 7.3 g/dL (ref 6.5–8.1)

## 2020-04-07 LAB — CBC WITH DIFFERENTIAL/PLATELET
Abs Immature Granulocytes: 0.02 10*3/uL (ref 0.00–0.07)
Basophils Absolute: 0.1 10*3/uL (ref 0.0–0.1)
Basophils Relative: 1 %
Eosinophils Absolute: 0.2 10*3/uL (ref 0.0–0.5)
Eosinophils Relative: 2 %
HCT: 30.9 % — ABNORMAL LOW (ref 39.0–52.0)
Hemoglobin: 10.4 g/dL — ABNORMAL LOW (ref 13.0–17.0)
Immature Granulocytes: 0 %
Lymphocytes Relative: 29 %
Lymphs Abs: 2.6 10*3/uL (ref 0.7–4.0)
MCH: 39 pg — ABNORMAL HIGH (ref 26.0–34.0)
MCHC: 33.7 g/dL (ref 30.0–36.0)
MCV: 115.7 fL — ABNORMAL HIGH (ref 80.0–100.0)
Monocytes Absolute: 1.2 10*3/uL — ABNORMAL HIGH (ref 0.1–1.0)
Monocytes Relative: 13 %
Neutro Abs: 4.8 10*3/uL (ref 1.7–7.7)
Neutrophils Relative %: 55 %
Platelets: 341 10*3/uL (ref 150–400)
RBC: 2.67 MIL/uL — ABNORMAL LOW (ref 4.22–5.81)
RDW: 15.3 % (ref 11.5–15.5)
WBC: 9 10*3/uL (ref 4.0–10.5)
nRBC: 0.3 % — ABNORMAL HIGH (ref 0.0–0.2)

## 2020-04-07 LAB — IRON AND TIBC
Iron: 169 ug/dL (ref 45–182)
Saturation Ratios: 67 % — ABNORMAL HIGH (ref 17.9–39.5)
TIBC: 254 ug/dL (ref 250–450)
UIBC: 85 ug/dL

## 2020-04-07 LAB — FERRITIN: Ferritin: 805 ng/mL — ABNORMAL HIGH (ref 24–336)

## 2020-04-07 MED FILL — DEFERASIROX 180 MG TABS: 180 | 30 days supply | Qty: 120 | Fill #0

## 2020-04-07 NOTE — Progress Notes (Signed)
[place], [place] [address]   CLINIC:  Medical Oncology/Hematology  PCP:  Venia Carbon, MD [place] / [place] [address]  [phone]  REASON FOR VISIT:  Follow-up for hereditary hemochromatosis  PRIOR THERAPY: None  CURRENT THERAPY: Deferasirox 720 mg QD  INTERVAL HISTORY:  [name], a 74 y.o. 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 Aaron Sexton was last seen on 03/10/2020.  Today he is accompanied by his wife and he reports okay. He complains of frequently waking up during the night ever since he started taking deferasirox every night at 8 PM; he never had such issues before. He denies having any N/V and his appetite is excellent. He reports having 2 episodes of diarrhea in the past 1 month. He denies having any F/C or infections recently.   REVIEW OF SYSTEMS:  Review of Systems  Constitutional: Positive for fatigue (depleted). Negative for appetite change, chills and fever.  Gastrointestinal: Positive for constipation and diarrhea (x 2 days).  Musculoskeletal: Positive for back pain (10/10 back pain).  Neurological: Positive for numbness (& burning in fingers & feet).  Psychiatric/Behavioral: Positive for sleep disturbance (frequent awakenings).  All other systems reviewed and are negative.   PAST MEDICAL/SURGICAL HISTORY:  Past Medical History:  Diagnosis Date  . Anemia   . Arteriosclerotic cardiovascular disease (ASCVD) 1993   Critical RCA disease in 1993 treated with PTCA  . Cerebrovascular disease    Moderate ASVD without focal stenosis in 01/2007  . Colon polyps 3/12   single 49mm polyp--tubular adenoma  . Diverticulosis 2012   found on colonoscopy  . Erectile dysfunction   . Family history of adverse reaction to anesthesia    difficult for son & sistor to wake   . GERD (gastroesophageal reflux disease)   . Hematochezia 05/24/2010  . Hyperlipidemia   .  Osteoarthritis    knees/hands-Dr Ronnie Derby  . Overweight(278.02)   . Peripheral vascular disease ([facility]) 03/11/2010   Moderate SFA stenosis; history of claudication  . Pneumonia   . Rosacea   . Tobacco abuse, in remission    30-40 pack years discontinued in 1993  . Vitamin B12 deficiency    Past Surgical History:  Procedure Laterality Date  . APPENDECTOMY    . COLONOSCOPY W/ POLYPECTOMY  2012  . ILIAC VEIN ANGIOPLASTY / STENTING Right 08/09/2015  . PERIPHERAL VASCULAR CATHETERIZATION Bilateral 06/21/2015   Procedure: Lower Extremity Angiography;  Surgeon: Lorretta Harp, MD;  Location: [facility];  Service: Cardiovascular;  Laterality: Bilateral;  . PERIPHERAL VASCULAR CATHETERIZATION N/A 06/21/2015   Procedure: Abdominal Aortogram;  Surgeon: Lorretta Harp, MD;  Location: [facility];  Service: Cardiovascular;  Laterality: N/A;  . PERIPHERAL VASCULAR CATHETERIZATION N/A 07/26/2015   Procedure: Lower Extremity Angiography;  Surgeon: Lorretta Harp, MD;  Location: [facility];  Service: Cardiovascular;  Laterality: N/A;  . PERIPHERAL VASCULAR CATHETERIZATION N/A 08/09/2015   Procedure: Lower Extremity Angiography;  Surgeon: Lorretta Harp, MD;  Location: [facility];  Service: Cardiovascular;  Laterality: N/A;  . PERIPHERAL VASCULAR CATHETERIZATION  08/09/2015   Procedure: Peripheral Vascular Intervention;  Surgeon: Lorretta Harp, MD;  Location: [facility];  Service: Cardiovascular;;  rt ext. iliac atherectomy and stent  . PERIPHERAL VASCULAR CATHETERIZATION N/A 09/20/2015   Procedure: Lower Extremity Angiography;  Surgeon: Lorretta Harp, MD;  Location: [facility];  Service: Cardiovascular;  Laterality:  N/A;  . PERIPHERAL VASCULAR CATHETERIZATION Right 09/20/2015   Procedure: Peripheral Vascular Intervention;  Surgeon: Lorretta Harp, MD;  Location: [facility];  Service: Cardiovascular;  Laterality: Right;  SFA  . ROTATOR CUFF REPAIR  Left 7/15   Dr Ronnie Derby    SOCIAL HISTORY:  Social History   Socioeconomic History  . Marital status: Married    Spouse name: Not on file  . Number of children: 3  . Years of education: Not on file  . Highest education level: Not on file  Occupational History  . 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 Smoking status: Former Smoker    Packs/day: 1.00    Years: 30.00    Pack years: 30.00    Quit date: 05/16/1990    Years since quitting: 29.9  . Smokeless tobacco: Never Used  . Tobacco comment: Quit in 1993  Vaping Use  . Vaping Use: Never used  Substance and Sexual Activity  . Alcohol use: No    Alcohol/week: 0.0 standard drinks  . Drug use: No  . Sexual activity: Not on file  Other Topics Concern  . Not on file  Social History Narrative   Has living will   Wife is health care POA---alternate is son Monica Martinez   Would accept resuscitation but no prolonged artificial ventilation.   No tube feeds if cognitively unaware   Social Determinants of Health   Financial Resource Strain: Low Risk   . Difficulty of Paying Living Expenses: Not hard at all  Food Insecurity: No Food Insecurity  . Worried About Charity fundraiser in the Last Year: Never true  . Ran Out of Food in the Last Year: Never true  Transportation Needs: No Transportation Needs  . Lack of Transportation (Medical): No  . Lack of Transportation (Non-Medical): No  Physical Activity: Inactive  . Days of Exercise per Week: 0 days  . Minutes of Exercise per Session: 0 min  Stress: No Stress Concern Present  . Feeling of Stress : Not at all  Social Connections: Moderately Integrated  . Frequency of Communication with Friends and Family: More than three times a week  . Frequency of Social Gatherings with Friends and Family: Never  . Attends Religious Services: More than 4 times per year  . Active Member of Clubs or Organizations: No  . Attends Theatre manager Meetings: Never  . Marital Status: Married  Human resources officer Violence: Not At Risk  . Fear of Current or Ex-Partner: No  . Emotionally Abused: No  . Physically Abused: No  . Sexually Abused: No    FAMILY HISTORY:  Family History  Problem Relation Age of Onset  . Hypertension Father        And siblings  . Heart disease Father        And second-degree relatives  . Transient ischemic attack Father   . Lung cancer Mother   . Diabetes Mother   . Brain cancer Sister   . Diabetes Brother   . Atrial fibrillation Brother   . Heart disease Sister   . Heart disease Sister   . Thyroid disease Sister   . Ulcers Son   . Diabetes Son   . Diabetes Son     CURRENT MEDICATIONS:  Current Outpatient Medications  Medication Sig Dispense Refill  . acetaminophen (TYLENOL) 500 MG tablet Take 500 mg by mouth every 8 (eight) hours as needed. Taking with tramadol    .  aspirin 81 MG tablet Take 81 mg by mouth daily.      . chlorthalidone (HYGROTON) 25 MG tablet TAKE 1/2 TABLET(12.5 MG) BY MOUTH DAILY 45 tablet 3  . cimetidine (TAGAMET) 400 MG tablet Take 400 mg by mouth 2 (two) times daily as needed (indigestion).     . clopidogrel (PLAVIX) 75 MG tablet TAKE 1 TABLET(75 MG) BY MOUTH DAILY WITH BREAKFAST 30 tablet 11  . Cyanocobalamin (VITAMIN B-12) 500 MCG SUBL Place 500 mcg under the tongue daily.    . Deferasirox 180 MG TABS TAKE 4 TABLETS BY MOUTH DAILY 120 tablet 0  . ergocalciferol (VITAMIN D2) 1.25 MG (50000 UT) capsule Take 1 capsule (50,000 Units total) by mouth once a week. 12 capsule 2  . losartan (COZAAR) 25 MG tablet TAKE ONE TABLET BY MOUTH EVERY DAY 90 tablet 3  . metoprolol succinate (TOPROL-XL) 100 MG 24 hr tablet Take 1 tablet (100 mg total) by mouth daily. Take with or immediately following a meal. 90 tablet 3  . nitroGLYCERIN (NITROSTAT) 0.4 MG SL tablet Place 1 tablet (0.4 mg total) under the tongue every 5 (five) minutes as needed for chest pain. 25 tablet 3  .  Omega-3 Fatty Acids (FISH OIL PO) Take 1,000 mg by mouth daily.     . pravastatin (PRAVACHOL) 40 MG tablet TAKE 1 TABLET(40 MG) BY MOUTH DAILY 90 tablet 3  . Probiotic Product (PROBIOTIC PO) Take by mouth.    . traMADol (ULTRAM) 50 MG tablet TAKE 1 TABLET(50 MG) BY MOUTH EVERY 8 HOURS AS NEEDED 90 tablet 0   No current facility-administered medications for this visit.    ALLERGIES:  Allergies  Allergen Reactions  . Diltiazem Hcl Hives and Rash  . Cardizem [Diltiazem Hcl]     PHYSICAL EXAM:  Performance status (ECOG): 0 - Asymptomatic  Vitals:   04/07/20 1511  BP: (!) 119/56  Pulse: 83  Resp: 16  SpO2: 97%   Wt Readings from Last 3 Encounters:  04/07/20 211 lb (95.7 kg)  04/06/20 211 lb (95.7 kg)  03/10/20 210 lb 8 oz (95.5 kg)   Physical Exam Vitals reviewed.  Constitutional:      Appearance: Normal appearance. He is obese.  Cardiovascular:     Rate and Rhythm: Normal rate and regular rhythm.     Pulses: Normal pulses.     Heart sounds: Normal heart sounds.  Pulmonary:     Effort: Pulmonary effort is normal.     Breath sounds: Normal breath sounds.  Abdominal:     Palpations: Abdomen is soft. There is no hepatomegaly, splenomegaly or mass.     Tenderness: There is no abdominal tenderness.     Hernia: No hernia is present.  Neurological:     General: No focal deficit present.     Mental Status: He is alert and oriented to person, place, and time.  Psychiatric:        Mood and Affect: Mood normal.        Behavior: Behavior normal.     LABORATORY DATA:  I have reviewed the labs as listed.  CBC Latest Ref Rng & Units 04/07/2020 03/10/2020 02/09/2020  WBC 4.0 - 10.5 K/uL 9.0 10.4 8.5  Hemoglobin 13.0 - 17.0 g/dL 10.4(L) 9.7(L) 9.7(L)  Hematocrit 39 - 52 % 30.9(L) 30.0(L) 29.3(L)  Platelets 150 - 400 K/uL 341 319 311   CMP Latest Ref Rng & Units 04/07/2020 03/10/2020 02/09/2020  Glucose 70 - 99 mg/dL 96 106(H) 93  BUN 8 -  23 mg/dL 31(H) 23 23  Creatinine 0.61 -  1.24 mg/dL 1.16 1.14 1.00  Sodium 135 - 145 mmol/L 136 134(L) 136  Potassium 3.5 - 5.1 mmol/L 3.7 3.7 3.8  Chloride 98 - 111 mmol/L 103 102 103  CO2 22 - 32 mmol/L 23 23 24   Calcium 8.9 - 10.3 mg/dL 8.9 9.0 8.9  Total Protein 6.5 - 8.1 g/dL 7.3 7.3 6.9  Total Bilirubin 0.3 - 1.2 mg/dL 0.6 0.5 0.6  Alkaline Phos 38 - 126 U/L 33(L) 34(L) 34(L)  AST 15 - 41 U/L 24 27 26   ALT 0 - 44 U/L 35 44 43      Component Value Date/Time   RBC 2.67 (L) 04/07/2020 1407   MCV 115.7 (H) 04/07/2020 1407   MCH 39.0 (H) 04/07/2020 1407   MCHC 33.7 04/07/2020 1407   RDW 15.3 04/07/2020 1407   LYMPHSABS 2.6 04/07/2020 1407   MONOABS 1.2 (H) 04/07/2020 1407   EOSABS 0.2 04/07/2020 1407   BASOSABS 0.1 04/07/2020 1407   Lab Results  Component Value Date   TIBC 254 04/07/2020   TIBC 265 03/10/2020   TIBC 251 02/09/2020   FERRITIN 805 (H) 04/07/2020   FERRITIN 771 (H) 03/10/2020   FERRITIN 944 (H) 02/09/2020   IRONPCTSAT 67 (H) 04/07/2020   IRONPCTSAT 64 (H) 03/10/2020   IRONPCTSAT 68 (H) 02/09/2020    DIAGNOSTIC IMAGING:  I have independently reviewed the scans and discussed with the patient. No results found.   ASSESSMENT:  1. Hereditary hemochromatosis: -Patient seen for elevated ferritin levels. -EMR evaluation shows hemochromatosis testing on 01/27/2019 which showed heterozygosity for C282Y and H63D variants. -Given the elevated ferritin levels above 1000 and the results of genetic testing, this is compatible with hereditary hemochromatosis. -Patient does have arthritis of the small joints of the hands and arthritis of the back. -No history of CHF but has CAD. No diabetes. -MRI of the liver on 12/11/2019 shows estimated liver iron concentration approximately 7.7 mg/g compatible with moderate liver iron. Estimated fat fraction ranges from 7-12%, more pronounced in the right lobe compared to left hepatic lobe. No focal hepatic lesions seen. No morphological changes of significant liver  disease. -Jadenu 540 mg daily started on 12/24/2019, dose increased to 720 mg daily on 02/09/2020.  2. Macrocytic anemia: -Colonoscopy by Dr. Loletha Carrow on 02/17/2019 shows diverticulosis in the left colon, 12 mm polyp in the cecum, 4 to 6 mm polyp in the ascending colon and 5 mm polyp in the descending colon. Pathology was consistent with tubular adenomas.   PLAN:  1. Hereditary hemochromatosis: -He is tolerating Jadenu 720 mg daily reasonably well.  He is taking it at bedtime.  He reports that he has not been able to sleep well. -10% of people taking [place] will have sleeping problems. -We will switch him to Jadenu in the mornings on an empty stomach.  We will see if this helps.  Otherwise he will try taking melatonin prior to giving any prescription sleep medicine. -Reviewed labs from today which showed ferritin 805 and percent saturation of 67.  Hemoglobin 10.4 and platelet count was 341. -We will reevaluate him in 2 months prior to making any dose increase.  2. Macrocytic anemia: -CBC shows hemoglobin 10.4 with MCV of 115.  White count and platelet count is stable.  3. Vitamin D deficiency: -Continue vitamin D 50,000 units weekly.  Orders placed this encounter:  No orders of the defined types were placed in this encounter.    Derek Jack, MD  [place] [phone]   I, Milinda Antis, am acting as a scribe for Dr. Sanda Linger.  I, Derek Jack MD, have reviewed the above documentation for accuracy and completeness, and I agree with the above.

## 2020-04-07 NOTE — Patient Instructions (Addendum)
[place] at [facility] Discharge Instructions  You were seen today by Dr. Delton Coombes. He went over your recent results. Start taking the deferasirox every morning on an empty stomach and wait an hour before eating. If your sleep does not improve after 2-3 weeks, buy melatonin over the counter and take 1 tablet at bedtime. Dr. Delton Coombes will see you back in 2 months for labs and follow up.   Thank you for choosing [place] at [facility] to provide your oncology and hematology care.  To afford each patient quality time with our provider, please arrive at least 15 minutes before your scheduled appointment time.   If you have a lab appointment with the [facility] please come in thru the Main Entrance and check in at the main information desk  You need to re-schedule your appointment should you arrive 10 or more minutes late.  We strive to give you quality time with our providers, and arriving late affects you and other patients whose appointments are after yours.  Also, if you no show three or more times for appointments you may be dismissed from the clinic at the providers discretion.     Again, thank you for choosing [facility].  Our hope is that these requests will decrease the amount of time that you wait before being seen by our physicians.       _____________________________________________________________  Should you have questions after your visit to [facility], please contact our office at [phone] between the hours of 8:00 a.m. and 4:30 p.m.  Voicemails left after 4:00 p.m. will not be returned until the following business day.  For prescription refill requests, have your pharmacy contact our office and allow 72 hours.    Cancer Center Support Programs:   > Cancer Support Group  2nd Tuesday of the month 1pm-2pm, Journey Room

## 2020-04-22 DIAGNOSIS — M25512 Pain in left shoulder: Secondary | ICD-10-CM | POA: Diagnosis not present

## 2020-04-22 DIAGNOSIS — M2578 Osteophyte, vertebrae: Secondary | ICD-10-CM | POA: Diagnosis not present

## 2020-04-22 DIAGNOSIS — M25712 Osteophyte, left shoulder: Secondary | ICD-10-CM | POA: Diagnosis not present

## 2020-04-22 DIAGNOSIS — M5136 Other intervertebral disc degeneration, lumbar region: Secondary | ICD-10-CM | POA: Diagnosis not present

## 2020-04-22 DIAGNOSIS — M545 Low back pain, unspecified: Secondary | ICD-10-CM | POA: Diagnosis not present

## 2020-04-22 DIAGNOSIS — W19XXXA Unspecified fall, initial encounter: Secondary | ICD-10-CM | POA: Diagnosis not present

## 2020-04-22 DIAGNOSIS — G8929 Other chronic pain: Secondary | ICD-10-CM | POA: Diagnosis not present

## 2020-04-22 DIAGNOSIS — M19012 Primary osteoarthritis, left shoulder: Secondary | ICD-10-CM | POA: Diagnosis not present

## 2020-04-27 ENCOUNTER — Other Ambulatory Visit (HOSPITAL_COMMUNITY): Payer: Self-pay | Admitting: Hematology

## 2020-04-29 ENCOUNTER — Other Ambulatory Visit (HOSPITAL_COMMUNITY): Payer: Self-pay | Admitting: Hematology

## 2020-04-29 DIAGNOSIS — M5416 Radiculopathy, lumbar region: Secondary | ICD-10-CM | POA: Diagnosis not present

## 2020-05-03 MED FILL — DEFERASIROX 180 MG TABS: 180 | 30 days supply | Qty: 120 | Fill #0

## 2020-05-10 ENCOUNTER — Telehealth: Payer: Self-pay | Admitting: Cardiovascular Disease

## 2020-05-10 NOTE — Telephone Encounter (Signed)
Patient's wife states the patient is having an MRI of the lower back and she would like to ensure that he is able to have it due to stent. Please advise.

## 2020-05-10 NOTE — Telephone Encounter (Signed)
Spoke with pt's wife Jakyren Fluegge (ok per [facility]) about stent causing issues with MRI.  Per stent manufacture website stent is made of nitinol and is safe for use with MRI. Per website nitinol may have a slight warmth effect that pts can sometimes feel. Annette verbalizes understanding and thanks me for this call.

## 2020-05-12 ENCOUNTER — Other Ambulatory Visit: Payer: Self-pay | Admitting: Cardiovascular Disease

## 2020-05-19 DIAGNOSIS — M5416 Radiculopathy, lumbar region: Secondary | ICD-10-CM | POA: Diagnosis not present

## 2020-05-21 DIAGNOSIS — M5416 Radiculopathy, lumbar region: Secondary | ICD-10-CM | POA: Diagnosis not present

## 2020-05-31 ENCOUNTER — Other Ambulatory Visit: Payer: Self-pay | Admitting: Cardiology

## 2020-05-31 ENCOUNTER — Other Ambulatory Visit (HOSPITAL_COMMUNITY): Payer: Self-pay | Admitting: Hematology

## 2020-06-07 ENCOUNTER — Other Ambulatory Visit: Payer: Self-pay

## 2020-06-07 MED FILL — DEFERASIROX 180 MG TABS: 180 | 30 days supply | Qty: 120 | Fill #0

## 2020-06-07 NOTE — Telephone Encounter (Signed)
Pt said he has been out of tramadol for 1 wk due to [facility] not responding to [facility] [address]. I do not see request for [facility] from [facility] in pts chart; pt said has gotten tramadol refilled since 04/06/20. Per pts chart last tramadol refilled # 90 on 04/06/20 and pt said he takes tramadol almost every 8 hrs.  I called [facility] [address]  And spoke with Wells Guiles;  Wells Guiles said refilled tramadol 50 mg # 90 on 04/28/20 for Dr Silvio Pate; Wells Guiles said started request tramadol electronically on 05/31/20 with no response from out office; I advised Wells Guiles and pt I do not see where request had come into office electronically and I could not see where Dr Silvio Pate refilled on 04/28/20. After Dr Silvio Pate reviews this note pt request cb. Pt last seen 04/06/20 for acute visit. Pt scheduled annual exam on 12/10/20. Sending note to Dr Silvio Pate and Larene Beach CMA.

## 2020-06-08 MED ORDER — TRAMADOL HCL 50 MG PO TABS: ORAL_TABLET | ORAL | 0 refills | Status: DC

## 2020-06-08 NOTE — Telephone Encounter (Signed)
Let him know that I sent refill. We did not get any electronic request If 2 days go by after he contacts pharmacy, he should check with [place]

## 2020-06-08 NOTE — Telephone Encounter (Signed)
Spoke to pt

## 2020-06-16 ENCOUNTER — Other Ambulatory Visit: Payer: Self-pay

## 2020-06-16 ENCOUNTER — Inpatient Hospital Stay (HOSPITAL_COMMUNITY): Payer: Medicare PPO | Attending: Hematology

## 2020-06-16 DIAGNOSIS — Z87891 Personal history of nicotine dependence: Secondary | ICD-10-CM | POA: Diagnosis not present

## 2020-06-16 DIAGNOSIS — K219 Gastro-esophageal reflux disease without esophagitis: Secondary | ICD-10-CM | POA: Insufficient documentation

## 2020-06-16 DIAGNOSIS — R197 Diarrhea, unspecified: Secondary | ICD-10-CM | POA: Insufficient documentation

## 2020-06-16 DIAGNOSIS — D649 Anemia, unspecified: Secondary | ICD-10-CM | POA: Diagnosis not present

## 2020-06-16 DIAGNOSIS — E785 Hyperlipidemia, unspecified: Secondary | ICD-10-CM | POA: Insufficient documentation

## 2020-06-16 DIAGNOSIS — Z79899 Other long term (current) drug therapy: Secondary | ICD-10-CM | POA: Diagnosis not present

## 2020-06-16 DIAGNOSIS — M1990 Unspecified osteoarthritis, unspecified site: Secondary | ICD-10-CM | POA: Insufficient documentation

## 2020-06-16 DIAGNOSIS — R5383 Other fatigue: Secondary | ICD-10-CM | POA: Insufficient documentation

## 2020-06-16 DIAGNOSIS — Z86010 Personal history of colonic polyps: Secondary | ICD-10-CM | POA: Insufficient documentation

## 2020-06-16 DIAGNOSIS — I2510 Atherosclerotic heart disease of native coronary artery without angina pectoris: Secondary | ICD-10-CM | POA: Insufficient documentation

## 2020-06-16 LAB — COMPREHENSIVE METABOLIC PANEL
ALT: 37 U/L (ref 0–44)
AST: 23 U/L (ref 15–41)
Albumin: 3.9 g/dL (ref 3.5–5.0)
Alkaline Phosphatase: 32 U/L — ABNORMAL LOW (ref 38–126)
Anion gap: 7 (ref 5–15)
BUN: 28 mg/dL — ABNORMAL HIGH (ref 8–23)
CO2: 22 mmol/L (ref 22–32)
Calcium: 8.8 mg/dL — ABNORMAL LOW (ref 8.9–10.3)
Chloride: 105 mmol/L (ref 98–111)
Creatinine, Ser: 1.04 mg/dL (ref 0.61–1.24)
GFR, Estimated: >60 mL/min (ref >60)
Glucose, Bld: 100 mg/dL — ABNORMAL HIGH (ref 70–99)
Potassium: 3.8 mmol/L (ref 3.5–5.1)
Sodium: 134 mmol/L — ABNORMAL LOW (ref 135–145)
Total Bilirubin: 0.6 mg/dL (ref 0.3–1.2)
Total Protein: 7.1 g/dL (ref 6.5–8.1)

## 2020-06-16 LAB — IRON AND TIBC
Iron: 175 ug/dL (ref 45–182)
Saturation Ratios: 64 % — ABNORMAL HIGH (ref 17.9–39.5)
TIBC: 273 ug/dL (ref 250–450)
UIBC: 98 ug/dL

## 2020-06-16 LAB — CBC WITH DIFFERENTIAL/PLATELET
Abs Immature Granulocytes: 0.04 10*3/uL (ref 0.00–0.07)
Basophils Absolute: 0.1 10*3/uL (ref 0.0–0.1)
Basophils Relative: 1 %
Eosinophils Absolute: 0.2 10*3/uL (ref 0.0–0.5)
Eosinophils Relative: 2 %
HCT: 31 % — ABNORMAL LOW (ref 39.0–52.0)
Hemoglobin: 10.1 g/dL — ABNORMAL LOW (ref 13.0–17.0)
Immature Granulocytes: 0 %
Lymphocytes Relative: 25 %
Lymphs Abs: 2.6 10*3/uL (ref 0.7–4.0)
MCH: 38.1 pg — ABNORMAL HIGH (ref 26.0–34.0)
MCHC: 32.6 g/dL (ref 30.0–36.0)
MCV: 117 fL — ABNORMAL HIGH (ref 80.0–100.0)
Monocytes Absolute: 1.1 10*3/uL — ABNORMAL HIGH (ref 0.1–1.0)
Monocytes Relative: 10 %
Neutro Abs: 6.6 10*3/uL (ref 1.7–7.7)
Neutrophils Relative %: 62 %
Platelets: 330 10*3/uL (ref 150–400)
RBC: 2.65 MIL/uL — ABNORMAL LOW (ref 4.22–5.81)
RDW: 16 % — ABNORMAL HIGH (ref 11.5–15.5)
WBC: 10.6 10*3/uL — ABNORMAL HIGH (ref 4.0–10.5)
nRBC: 0.5 % — ABNORMAL HIGH (ref 0.0–0.2)

## 2020-06-16 LAB — FERRITIN: Ferritin: 599 ng/mL — ABNORMAL HIGH (ref 24–336)

## 2020-06-23 ENCOUNTER — Other Ambulatory Visit: Payer: Self-pay

## 2020-06-23 ENCOUNTER — Inpatient Hospital Stay (HOSPITAL_COMMUNITY): Payer: Medicare PPO | Admitting: Hematology

## 2020-06-23 DIAGNOSIS — R5383 Other fatigue: Secondary | ICD-10-CM | POA: Diagnosis not present

## 2020-06-23 DIAGNOSIS — Z86010 Personal history of colonic polyps: Secondary | ICD-10-CM | POA: Diagnosis not present

## 2020-06-23 DIAGNOSIS — E785 Hyperlipidemia, unspecified: Secondary | ICD-10-CM | POA: Diagnosis not present

## 2020-06-23 DIAGNOSIS — R197 Diarrhea, unspecified: Secondary | ICD-10-CM | POA: Diagnosis not present

## 2020-06-23 DIAGNOSIS — K219 Gastro-esophageal reflux disease without esophagitis: Secondary | ICD-10-CM | POA: Diagnosis not present

## 2020-06-23 DIAGNOSIS — I2510 Atherosclerotic heart disease of native coronary artery without angina pectoris: Secondary | ICD-10-CM | POA: Diagnosis not present

## 2020-06-23 DIAGNOSIS — D649 Anemia, unspecified: Secondary | ICD-10-CM | POA: Diagnosis not present

## 2020-06-23 DIAGNOSIS — M1990 Unspecified osteoarthritis, unspecified site: Secondary | ICD-10-CM | POA: Diagnosis not present

## 2020-06-23 NOTE — Patient Instructions (Signed)
[place] at [facility] Discharge Instructions  You were seen today by Dr. Delton Coombes. He went over your recent results. Avoid taking vitamin C, multivitamins with iron, or cooking in cast-iron pans. Dr. Delton Coombes will see you back in 2 months for labs and follow up.   Thank you for choosing [place] at [facility] to provide your oncology and hematology care.  To afford each patient quality time with our provider, please arrive at least 15 minutes before your scheduled appointment time.   If you have a lab appointment with the [facility] please come in thru the Main Entrance and check in at the main information desk  You need to re-schedule your appointment should you arrive 10 or more minutes late.  We strive to give you quality time with our providers, and arriving late affects you and other patients whose appointments are after yours.  Also, if you no show three or more times for appointments you may be dismissed from the clinic at the providers discretion.     Again, thank you for choosing [facility].  Our hope is that these requests will decrease the amount of time that you wait before being seen by our physicians.       _____________________________________________________________  Should you have questions after your visit to [facility], please contact our office at [phone] between the hours of 8:00 a.m. and 4:30 p.m.  Voicemails left after 4:00 p.m. will not be returned until the following business day.  For prescription refill requests, have your pharmacy contact our office and allow 72 hours.    Cancer Center Support Programs:   > Cancer Support Group  2nd Tuesday of the month 1pm-2pm, Journey Room

## 2020-06-23 NOTE — Progress Notes (Signed)
[name], [place] [address]   CLINIC:  Medical Oncology/Hematology  PCP:  Venia Carbon, MD [place] / [place] [address]  [phone]  REASON FOR VISIT:  Follow-up for hereditary hemochromatosis  PRIOR THERAPY: None  CURRENT THERAPY: Deferasirox 720 mg daily  INTERVAL HISTORY:  [name], a 75 y.o. 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 Merek was last seen on 04/07/2020.  Today he is accompanied by his wife and he reports feeling well. He is taking deferasirox first thing in the AM on an empty stomach and reports having only 2 days of diarrhea, which required Imodium on both days.   REVIEW OF SYSTEMS:  Review of Systems  Constitutional: Positive for fatigue (50%). Negative for appetite change.  Gastrointestinal: Positive for diarrhea (only 2 days).  Musculoskeletal: Positive for arthralgias (5/10 knee pain) and back pain.  All other systems reviewed and are negative.   PAST MEDICAL/SURGICAL HISTORY:  Past Medical History:  Diagnosis Date  . Anemia   . Arteriosclerotic cardiovascular disease (ASCVD) 1993   Critical RCA disease in 1993 treated with PTCA  . Cerebrovascular disease    Moderate ASVD without focal stenosis in 01/2007  . Colon polyps 3/12   single 76mm polyp--tubular adenoma  . Diverticulosis 2012   found on colonoscopy  . Erectile dysfunction   . Family history of adverse reaction to anesthesia    difficult for son & sistor to wake   . GERD (gastroesophageal reflux disease)   . Hematochezia 05/24/2010  . Hyperlipidemia   . Osteoarthritis    knees/hands-Dr Ronnie Derby  . Overweight(278.02)   . Peripheral vascular disease ([place]) 03/11/2010   Moderate SFA stenosis; history of claudication  . Pneumonia   . Rosacea   . Tobacco abuse, in remission    30-40 pack years discontinued in 1993  . Vitamin B12 deficiency    Past Surgical History:  Procedure  Laterality Date  . APPENDECTOMY    . COLONOSCOPY W/ POLYPECTOMY  2012  . ILIAC VEIN ANGIOPLASTY / STENTING Right 08/09/2015  . PERIPHERAL VASCULAR CATHETERIZATION Bilateral 06/21/2015   Procedure: Lower Extremity Angiography;  Surgeon: Lorretta Harp, MD;  Location: [facility];  Service: Cardiovascular;  Laterality: Bilateral;  . PERIPHERAL VASCULAR CATHETERIZATION N/A 06/21/2015   Procedure: Abdominal Aortogram;  Surgeon: Lorretta Harp, MD;  Location: [facility];  Service: Cardiovascular;  Laterality: N/A;  . PERIPHERAL VASCULAR CATHETERIZATION N/A 07/26/2015   Procedure: Lower Extremity Angiography;  Surgeon: Lorretta Harp, MD;  Location: [facility];  Service: Cardiovascular;  Laterality: N/A;  . PERIPHERAL VASCULAR CATHETERIZATION N/A 08/09/2015   Procedure: Lower Extremity Angiography;  Surgeon: Lorretta Harp, MD;  Location: [facility];  Service: Cardiovascular;  Laterality: N/A;  . PERIPHERAL VASCULAR CATHETERIZATION  08/09/2015   Procedure: Peripheral Vascular Intervention;  Surgeon: Lorretta Harp, MD;  Location: [facility];  Service: Cardiovascular;;  rt ext. iliac atherectomy and stent  . PERIPHERAL VASCULAR CATHETERIZATION N/A 09/20/2015   Procedure: Lower Extremity Angiography;  Surgeon: Lorretta Harp, MD;  Location: [facility];  Service: Cardiovascular;  Laterality: N/A;  . PERIPHERAL VASCULAR CATHETERIZATION Right 09/20/2015   Procedure: Peripheral Vascular Intervention;  Surgeon: Lorretta Harp, MD;  Location: [facility];  Service: Cardiovascular;  Laterality: Right;  SFA  . ROTATOR CUFF REPAIR Left 7/15   Dr Ronnie Derby    SOCIAL HISTORY:  Social History   Socioeconomic History  . Marital status: Married    Spouse name: Not on file  . Number of children: 3  . Years of education: Not on file  . Highest education level: Not on file  Occupational History  . 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 Smoking status: Former Smoker    Packs/day: 1.00    Years: 30.00    Pack years: 30.00    Quit date: 05/16/1990    Years since quitting: 30.1  . Smokeless tobacco: Never Used  . Tobacco comment: Quit in 1993  Vaping Use  . Vaping Use: Never used  Substance and Sexual Activity  . Alcohol use: No    Alcohol/week: 0.0 standard drinks  . Drug use: No  . Sexual activity: Not on file  Other Topics Concern  . Not on file  Social History Narrative   Has living will   Wife is health care POA---alternate is son Monica Martinez   Would accept resuscitation but no prolonged artificial ventilation.   No tube feeds if cognitively unaware   Social Determinants of Health   Financial Resource Strain: Low Risk   . Difficulty of Paying Living Expenses: Not hard at all  Food Insecurity: No Food Insecurity  . Worried About Charity fundraiser in the Last Year: Never true  . Ran Out of Food in the Last Year: Never true  Transportation Needs: No Transportation Needs  . Lack of Transportation (Medical): No  . Lack of Transportation (Non-Medical): No  Physical Activity: Inactive  . Days of Exercise per Week: 0 days  . Minutes of Exercise per Session: 0 min  Stress: No Stress Concern Present  . Feeling of Stress : Not at all  Social Connections: Moderately Integrated  . Frequency of Communication with Friends and Family: More than three times a week  . Frequency of Social Gatherings with Friends and Family: Never  . Attends Religious Services: More than 4 times per year  . Active Member of Clubs or Organizations: No  . Attends Archivist Meetings: Never  . Marital Status: Married  Human resources officer Violence: Not At Risk  . Fear of Current or Ex-Partner: No  . Emotionally Abused: No  . Physically Abused: No  . Sexually Abused: No    FAMILY HISTORY:  Family History  Problem Relation Age of Onset  . Hypertension Father        And siblings   . Heart disease Father        And second-degree relatives  . Transient ischemic attack Father   . Lung cancer Mother   . Diabetes Mother   . Brain cancer Sister   . Diabetes Brother   . Atrial fibrillation Brother   . Heart disease Sister   . Heart disease Sister   . Thyroid disease Sister   . Ulcers Son   . Diabetes Son   . Diabetes Son     CURRENT MEDICATIONS:  Current Outpatient Medications  Medication Sig Dispense Refill  . Acetaminophen (TYLENOL) 325 MG CAPS Tylenol 325 mg capsule  Take 1 capsule 3 times a day by oral route.    [name] aspirin 81 MG tablet Take 81 mg by mouth daily.    . chlorthalidone (HYGROTON) 25 MG tablet TAKE 1/2 TABLET(12.5 MG) BY MOUTH DAILY 45 tablet 3  . cimetidine (TAGAMET) 400 MG tablet Take 400 mg by mouth 2 (two) times daily as  needed (indigestion).     . clopidogrel (PLAVIX) 75 MG tablet TAKE 1 TABLET(75 MG) BY MOUTH DAILY WITH BREAKFAST 30 tablet 9  . Cyanocobalamin (VITAMIN B-12) 500 MCG SUBL Place 500 mcg under the tongue daily.    . Deferasirox 180 MG TABS TAKE 4 TABLETS BY MOUTH DAILY 120 tablet 0  . ergocalciferol (VITAMIN D2) 1.25 MG (50000 UT) capsule Take 1 capsule (50,000 Units total) by mouth once a week. 12 capsule 2  . losartan (COZAAR) 25 MG tablet TAKE ONE TABLET BY MOUTH EVERY DAY 90 tablet 3  . methocarbamol (ROBAXIN) 500 MG tablet methocarbamol 500 mg tablet    . metoprolol succinate (TOPROL-XL) 100 MG 24 hr tablet Take 1 tablet (100 mg total) by mouth daily. Take with or immediately following a meal. 90 tablet 3  . nitroGLYCERIN (NITROSTAT) 0.4 MG SL tablet Take by mouth.    . Omega-3 Fatty Acids (FISH OIL PO) Take 1,000 mg by mouth daily.    . pravastatin (PRAVACHOL) 40 MG tablet TAKE 1 TABLET(40 MG) BY MOUTH DAILY 90 tablet 3  . Probiotic Product (PROBIOTIC PO) Take by mouth.    . traMADol (ULTRAM) 50 MG tablet TAKE 1 TABLET(50 MG) BY MOUTH EVERY 8 HOURS AS NEEDED 90 tablet 0   No current facility-administered medications for  this visit.    ALLERGIES:  Allergies  Allergen Reactions  . Diltiazem Hcl Hives and Rash  . Cardizem [Diltiazem Hcl]     PHYSICAL EXAM:  Performance status (ECOG): 0 - Asymptomatic  Vitals:   06/23/20 1343  BP: 138/63  Pulse: 86  Resp: 18  Temp: 98 F (36.7 C)  SpO2: 97%   Wt Readings from Last 3 Encounters:  06/23/20 211 lb (95.7 kg)  04/07/20 211 lb (95.7 kg)  04/06/20 211 lb (95.7 kg)   Physical Exam Vitals reviewed.  Constitutional:      Appearance: Normal appearance. He is obese.  Cardiovascular:     Rate and Rhythm: Normal rate and regular rhythm.     Pulses: Normal pulses.     Heart sounds: Normal heart sounds.  Pulmonary:     Effort: Pulmonary effort is normal.     Breath sounds: Normal breath sounds.  Chest:  Breasts:     Right: No supraclavicular adenopathy.     Left: No supraclavicular adenopathy.    Abdominal:     Palpations: Abdomen is soft. There is no hepatomegaly, splenomegaly or mass.     Tenderness: There is no abdominal tenderness.     Hernia: No hernia is present.  Musculoskeletal:     Right lower leg: No edema.     Left lower leg: No edema.  Lymphadenopathy:     Cervical: No cervical adenopathy.     Upper Body:     Right upper body: No supraclavicular adenopathy.     Left upper body: No supraclavicular adenopathy.  Neurological:     General: No focal deficit present.     Mental Status: He is alert and oriented to person, place, and time.  Psychiatric:        Mood and Affect: Mood normal.        Behavior: Behavior normal.     LABORATORY DATA:  I have reviewed the labs as listed.  CBC Latest Ref Rng & Units 06/16/2020 04/07/2020 03/10/2020  WBC 4.0 - 10.5 K/uL 10.6(H) 9.0 10.4  Hemoglobin 13.0 - 17.0 g/dL 10.1(L) 10.4(L) 9.7(L)  Hematocrit 39.0 - 52.0 % 31.0(L) 30.9(L) 30.0(L)  Platelets 150 -  400 K/uL 330 341 319   CMP Latest Ref Rng & Units 06/16/2020 04/07/2020 03/10/2020  Glucose 70 - 99 mg/dL 100(H) 96 106(H)  BUN 8 - 23 mg/dL  28(H) 31(H) 23  Creatinine 0.61 - 1.24 mg/dL 1.04 1.16 1.14  Sodium 135 - 145 mmol/L 134(L) 136 134(L)  Potassium 3.5 - 5.1 mmol/L 3.8 3.7 3.7  Chloride 98 - 111 mmol/L 105 103 102  CO2 22 - 32 mmol/L 22 23 23   Calcium 8.9 - 10.3 mg/dL 8.8(L) 8.9 9.0  Total Protein 6.5 - 8.1 g/dL 7.1 7.3 7.3  Total Bilirubin 0.3 - 1.2 mg/dL 0.6 0.6 0.5  Alkaline Phos 38 - 126 U/L 32(L) 33(L) 34(L)  AST 15 - 41 U/L 23 24 27   ALT 0 - 44 U/L 37 35 44      Component Value Date/Time   RBC 2.65 (L) 06/16/2020 1340   MCV 117.0 (H) 06/16/2020 1340   MCH 38.1 (H) 06/16/2020 1340   MCHC 32.6 06/16/2020 1340   RDW 16.0 (H) 06/16/2020 1340   LYMPHSABS 2.6 06/16/2020 1340   MONOABS 1.1 (H) 06/16/2020 1340   EOSABS 0.2 06/16/2020 1340   BASOSABS 0.1 06/16/2020 1340   Lab Results  Component Value Date   TIBC 273 06/16/2020   TIBC 254 04/07/2020   TIBC 265 03/10/2020   FERRITIN 599 (H) 06/16/2020   FERRITIN 805 (H) 04/07/2020   FERRITIN 771 (H) 03/10/2020   IRONPCTSAT 64 (H) 06/16/2020   IRONPCTSAT 67 (H) 04/07/2020   IRONPCTSAT 64 (H) 03/10/2020    DIAGNOSTIC IMAGING:  I have independently reviewed the scans and discussed with the patient. No results found.   ASSESSMENT:  1. Hereditary hemochromatosis: -Patient seen for elevated ferritin levels. -EMR evaluation shows hemochromatosis testing on 01/27/2019 which showed heterozygosity for C282Y and H63D variants. -Given the elevated ferritin levels above 1000 and the results of genetic testing, this is compatible with hereditary hemochromatosis. -Patient does have arthritis of the small joints of the hands and arthritis of the back. -No history of CHF but has CAD. No diabetes. -MRI of the liver on 12/11/2019 shows estimated liver iron concentration approximately 7.7 mg/g compatible with moderate liver iron. Estimated fat fraction ranges from 7-12%, more pronounced in the right lobe compared to left hepatic lobe. No focal hepatic lesions seen. No  morphological changes of significant liver disease. -Jadenu 540 mg daily started on 12/24/2019, dose increased to 720 mg daily on 02/09/2020.  2. Macrocytic anemia: -Colonoscopy by Dr. Loletha Carrow on 02/17/2019 shows diverticulosis in the left colon, 12 mm polyp in the cecum, 4 to 6 mm polyp in the ascending colon and 5 mm polyp in the descending colon. Pathology was consistent with tubular adenomas.   PLAN:  1. Hereditary hemochromatosis: -He is taking Jadenu 720 mg in the mornings on an empty stomach and is doing very well. -No major side effects reported. -Reviewed labs from 06/16/2020.  Ferritin improved 599 from over 1000 prior to start of Jadenu.  Percent saturation is 64. -He reports that he received a letter from insurance company that they would no longer cover Jadenu.  I have not seen the letter yet.  We will prescribe alternative drugs if needed. -Continue Jadenu at the same dose level.  RTC 2 months for follow-up.  2. Macrocytic anemia: -Hemoglobin is 10.1 and stable.  MCV is 117.  White count is 10.6.  3. Vitamin D deficiency: -Continue vitamin D 50,000 units weekly.  Will check level at next visit in 2 months.  Orders placed this encounter:  No orders of the defined types were placed in this encounter.    Derek Jack, MD [place] [phone]   I, Milinda Antis, am acting as a scribe for Dr. Sanda Linger.  I, Derek Jack MD, have reviewed the above documentation for accuracy and completeness, and I agree with the above.

## 2020-07-05 ENCOUNTER — Other Ambulatory Visit (HOSPITAL_COMMUNITY): Payer: Self-pay | Admitting: Hematology

## 2020-07-06 ENCOUNTER — Other Ambulatory Visit: Payer: Self-pay | Admitting: Internal Medicine

## 2020-07-06 NOTE — Telephone Encounter (Signed)
Interface, Surescripts Out  [facility] Rx Refill An error occurred while processing the e-prescribing message.   The message was not sent electronically to the requested pharmacy. Contact the pharmacy about the approved prescription.   Code: 770 - Communication problem - try again later  Description Code: 008 - Request timed out before response could be received.  Internal error.Contact pharmacy by other means.   E-Prescribing Status: Transmission to pharmacy failed (07/06/2020 1:36 PM EST)

## 2020-07-06 NOTE — Addendum Note (Signed)
Addended by: Pilar Grammes on: 07/06/2020 02:12 PM   Modules accepted: Orders

## 2020-07-06 NOTE — Telephone Encounter (Signed)
Last filled10-27-21 #60 Last OV 04-06-20 Next OV 12-10-20 [facility] [address] [facility] [employer]

## 2020-07-07 MED ORDER — TRAMADOL HCL 50 MG PO TABS
ORAL_TABLET | ORAL | 0 refills | Status: DC
Start: 2020-07-07 — End: 2020-08-06

## 2020-07-07 NOTE — Addendum Note (Signed)
Addended by: Viviana Simpler I on: 07/07/2020 02:02 PM   Modules accepted: Orders

## 2020-07-09 MED FILL — DEFERASIROX 180 MG TABS: 180 | 30 days supply | Qty: 120 | Fill #0

## 2020-08-04 ENCOUNTER — Other Ambulatory Visit (HOSPITAL_COMMUNITY): Payer: Self-pay | Admitting: Hematology

## 2020-08-05 ENCOUNTER — Other Ambulatory Visit (HOSPITAL_COMMUNITY): Payer: Self-pay

## 2020-08-06 ENCOUNTER — Other Ambulatory Visit: Payer: Self-pay | Admitting: Internal Medicine

## 2020-08-06 NOTE — Telephone Encounter (Signed)
Last filled3-2-22 #60 Last OV 04-06-20 Next OV8-5-22 [facility] [address] [facility] [employer]

## 2020-08-07 ENCOUNTER — Other Ambulatory Visit (HOSPITAL_COMMUNITY): Payer: Self-pay

## 2020-08-08 ENCOUNTER — Other Ambulatory Visit (HOSPITAL_COMMUNITY): Payer: Self-pay

## 2020-08-08 MED FILL — Deferasirox Tab 180 MG: ORAL | 30 days supply | Qty: 120 | Fill #0 | Status: AC

## 2020-08-09 ENCOUNTER — Other Ambulatory Visit (HOSPITAL_COMMUNITY): Payer: Self-pay

## 2020-08-10 ENCOUNTER — Other Ambulatory Visit (HOSPITAL_COMMUNITY): Payer: Self-pay

## 2020-08-12 ENCOUNTER — Other Ambulatory Visit (HOSPITAL_COMMUNITY): Payer: Self-pay | Admitting: Hematology

## 2020-08-12 DIAGNOSIS — E559 Vitamin D deficiency, unspecified: Secondary | ICD-10-CM

## 2020-08-18 DIAGNOSIS — H43313 Vitreous membranes and strands, bilateral: Secondary | ICD-10-CM | POA: Diagnosis not present

## 2020-08-25 ENCOUNTER — Inpatient Hospital Stay (HOSPITAL_COMMUNITY): Payer: Medicare PPO

## 2020-08-25 ENCOUNTER — Other Ambulatory Visit: Payer: Self-pay

## 2020-08-25 ENCOUNTER — Encounter (HOSPITAL_COMMUNITY): Payer: Self-pay | Admitting: Hematology

## 2020-08-25 ENCOUNTER — Inpatient Hospital Stay (HOSPITAL_COMMUNITY): Payer: Medicare PPO | Attending: Hematology | Admitting: Hematology

## 2020-08-25 DIAGNOSIS — K635 Polyp of colon: Secondary | ICD-10-CM | POA: Diagnosis not present

## 2020-08-25 DIAGNOSIS — Z87891 Personal history of nicotine dependence: Secondary | ICD-10-CM | POA: Diagnosis not present

## 2020-08-25 DIAGNOSIS — D649 Anemia, unspecified: Secondary | ICD-10-CM | POA: Insufficient documentation

## 2020-08-25 DIAGNOSIS — K921 Melena: Secondary | ICD-10-CM | POA: Diagnosis not present

## 2020-08-25 DIAGNOSIS — K219 Gastro-esophageal reflux disease without esophagitis: Secondary | ICD-10-CM | POA: Insufficient documentation

## 2020-08-25 DIAGNOSIS — E785 Hyperlipidemia, unspecified: Secondary | ICD-10-CM | POA: Insufficient documentation

## 2020-08-25 DIAGNOSIS — E538 Deficiency of other specified B group vitamins: Secondary | ICD-10-CM | POA: Insufficient documentation

## 2020-08-25 LAB — CBC WITH DIFFERENTIAL/PLATELET
Abs Immature Granulocytes: 0.02 10*3/uL (ref 0.00–0.07)
Basophils Absolute: 0.1 10*3/uL (ref 0.0–0.1)
Basophils Relative: 1 %
Eosinophils Absolute: 0.2 10*3/uL (ref 0.0–0.5)
Eosinophils Relative: 2 %
HCT: 31 % — ABNORMAL LOW (ref 39.0–52.0)
Hemoglobin: 10.3 g/dL — ABNORMAL LOW (ref 13.0–17.0)
Immature Granulocytes: 0 %
Lymphocytes Relative: 28 %
Lymphs Abs: 2.3 10*3/uL (ref 0.7–4.0)
MCH: 39.2 pg — ABNORMAL HIGH (ref 26.0–34.0)
MCHC: 33.2 g/dL (ref 30.0–36.0)
MCV: 117.9 fL — ABNORMAL HIGH (ref 80.0–100.0)
Monocytes Absolute: 1 10*3/uL (ref 0.1–1.0)
Monocytes Relative: 12 %
Neutro Abs: 4.6 10*3/uL (ref 1.7–7.7)
Neutrophils Relative %: 57 %
Platelets: 321 10*3/uL (ref 150–400)
RBC: 2.63 MIL/uL — ABNORMAL LOW (ref 4.22–5.81)
RDW: 14.1 % (ref 11.5–15.5)
WBC: 8.2 10*3/uL (ref 4.0–10.5)
nRBC: 0.2 % (ref 0.0–0.2)

## 2020-08-25 LAB — COMPREHENSIVE METABOLIC PANEL
ALT: 19 U/L (ref 0–44)
AST: 19 U/L (ref 15–41)
Albumin: 3.9 g/dL (ref 3.5–5.0)
Alkaline Phosphatase: 36 U/L — ABNORMAL LOW (ref 38–126)
Anion gap: 9 (ref 5–15)
BUN: 20 mg/dL (ref 8–23)
CO2: 23 mmol/L (ref 22–32)
Calcium: 9.1 mg/dL (ref 8.9–10.3)
Chloride: 104 mmol/L (ref 98–111)
Creatinine, Ser: 1.02 mg/dL (ref 0.61–1.24)
GFR, Estimated: >60 mL/min (ref >60)
Glucose, Bld: 96 mg/dL (ref 70–99)
Potassium: 3.7 mmol/L (ref 3.5–5.1)
Sodium: 136 mmol/L (ref 135–145)
Total Bilirubin: 0.6 mg/dL (ref 0.3–1.2)
Total Protein: 7.4 g/dL (ref 6.5–8.1)

## 2020-08-25 LAB — IRON AND TIBC
Iron: 177 ug/dL (ref 45–182)
Saturation Ratios: 67 % — ABNORMAL HIGH (ref 17.9–39.5)
TIBC: 263 ug/dL (ref 250–450)
UIBC: 86 ug/dL

## 2020-08-25 LAB — VITAMIN D 25 HYDROXY (VIT D DEFICIENCY, FRACTURES): Vit D, 25-Hydroxy: 62.56 ng/mL (ref 30–100)

## 2020-08-25 LAB — FERRITIN: Ferritin: 473 ng/mL — ABNORMAL HIGH (ref 24–336)

## 2020-08-25 NOTE — Progress Notes (Signed)
Aaron Sexton,  [id number]   CLINIC:  Medical Oncology/Hematology  PCP:  Venia Carbon, MD [place] / [place] [address]  [phone]  REASON FOR VISIT:  Follow-up for hereditary hemochromatosis  PRIOR THERAPY: None  CURRENT THERAPY: Deferasirox 720 mg daily  INTERVAL HISTORY:  Aaron Sexton, a 75 y.o. 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 Aaron Sexton was last seen on 06/23/2020.  Today he is accompanied by his wife and he reports feeling okay. He is taking Deferasirox 720 mg daily on an empty stomach and denies having any abdominal pain. He continues taking vitamin D weekly.   REVIEW OF SYSTEMS:  Review of Systems  Constitutional: Positive for fatigue (50%). Negative for appetite change.  Gastrointestinal: Negative for abdominal pain.  Musculoskeletal: Positive for back pain (back & leg pain).  Psychiatric/Behavioral: Positive for sleep disturbance.  All other systems reviewed and are negative.   PAST MEDICAL/SURGICAL HISTORY:  Past Medical History:  Diagnosis Date  . Anemia   . Arteriosclerotic cardiovascular disease (ASCVD) 1993   Critical RCA disease in 1993 treated with PTCA  . Cerebrovascular disease    Moderate ASVD without focal stenosis in 01/2007  . Colon polyps 3/12   single 61mm polyp--tubular adenoma  . Diverticulosis 2012   found on colonoscopy  . Erectile dysfunction   . Family history of adverse reaction to anesthesia    difficult for son & sistor to wake   . GERD (gastroesophageal reflux disease)   . Hematochezia 05/24/2010  . Hyperlipidemia   . Osteoarthritis    knees/hands-Dr Ronnie Derby  . Overweight(278.02)   . Peripheral vascular disease ([facility]) 03/11/2010   Moderate SFA stenosis; history of claudication  . Pneumonia   . Rosacea   . Tobacco abuse, in remission    30-40 pack years discontinued in 1993  . Vitamin B12 deficiency    Past Surgical  History:  Procedure Laterality Date  . APPENDECTOMY    . COLONOSCOPY W/ POLYPECTOMY  2012  . ILIAC VEIN ANGIOPLASTY / STENTING Right 08/09/2015  . PERIPHERAL VASCULAR CATHETERIZATION Bilateral 06/21/2015   Procedure: Lower Extremity Angiography;  Surgeon: Lorretta Harp, MD;  Location: [facility];  Service: Cardiovascular;  Laterality: Bilateral;  . PERIPHERAL VASCULAR CATHETERIZATION N/A 06/21/2015   Procedure: Abdominal Aortogram;  Surgeon: Lorretta Harp, MD;  Location: [facility];  Service: Cardiovascular;  Laterality: N/A;  . PERIPHERAL VASCULAR CATHETERIZATION N/A 07/26/2015   Procedure: Lower Extremity Angiography;  Surgeon: Lorretta Harp, MD;  Location: [facility];  Service: Cardiovascular;  Laterality: N/A;  . PERIPHERAL VASCULAR CATHETERIZATION N/A 08/09/2015   Procedure: Lower Extremity Angiography;  Surgeon: Lorretta Harp, MD;  Location: [facility];  Service: Cardiovascular;  Laterality: N/A;  . PERIPHERAL VASCULAR CATHETERIZATION  08/09/2015   Procedure: Peripheral Vascular Intervention;  Surgeon: Lorretta Harp, MD;  Location: [facility];  Service: Cardiovascular;;  rt ext. iliac atherectomy and stent  . PERIPHERAL VASCULAR CATHETERIZATION N/A 09/20/2015   Procedure: Lower Extremity Angiography;  Surgeon: Lorretta Harp, MD;  Location: [facility];  Service: Cardiovascular;  Laterality: N/A;  . PERIPHERAL VASCULAR CATHETERIZATION Right 09/20/2015   Procedure: Peripheral Vascular Intervention;  Surgeon: Lorretta Harp, MD;  Location: [facility];  Service: Cardiovascular;  Laterality: Right;  SFA  . ROTATOR CUFF REPAIR Left 7/15   Dr Ronnie Derby    SOCIAL HISTORY:  Social History   Socioeconomic History  . Marital status: Married    Spouse name: Not on file  . Number of children: 3  . Years of education: Not on file  . Highest education level: Not on file  Occupational History  . 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 Smoking status: Former Smoker    Packs/day: 1.00    Years: 30.00    Pack years: 30.00    Quit date: 05/16/1990    Years since quitting: 30.2  . Smokeless tobacco: Never Used  . Tobacco comment: Quit in 1993  Vaping Use  . Vaping Use: Never used  Substance and Sexual Activity  . Alcohol use: No    Alcohol/week: 0.0 standard drinks  . Drug use: No  . Sexual activity: Not on file  Other Topics Concern  . Not on file  Social History Narrative   Has living will   Wife is health care POA---alternate is son Aaron Sexton   Would accept resuscitation but no prolonged artificial ventilation.   No tube feeds if cognitively unaware   Social Determinants of Health   Financial Resource Strain: Low Risk   . Difficulty of Paying Living Expenses: Not hard at all  Food Insecurity: No Food Insecurity  . Worried About Charity fundraiser in the Last Year: Never true  . Ran Out of Food in the Last Year: Never true  Transportation Needs: No Transportation Needs  . Lack of Transportation (Medical): No  . Lack of Transportation (Non-Medical): No  Physical Activity: Inactive  . Days of Exercise per Week: 0 days  . Minutes of Exercise per Session: 0 min  Stress: No Stress Concern Present  . Feeling of Stress : Not at all  Social Connections: Moderately Integrated  . Frequency of Communication with Friends and Family: More than three times a week  . Frequency of Social Gatherings with Friends and Family: Never  . Attends Religious Services: More than 4 times per year  . Active Member of Clubs or Organizations: No  . Attends Archivist Meetings: Never  . Marital Status: Married  Human resources officer Violence: Not At Risk  . Fear of Current or Ex-Partner: No  . Emotionally Abused: No  . Physically Abused: No  . Sexually Abused: No    FAMILY HISTORY:  Family History  Problem Relation Age of Onset  . Hypertension Father         And siblings  . Heart disease Father        And second-degree relatives  . Transient ischemic attack Father   . Lung cancer Mother   . Diabetes Mother   . Brain cancer Sister   . Diabetes Brother   . Atrial fibrillation Brother   . Heart disease Sister   . Heart disease Sister   . Thyroid disease Sister   . Ulcers Son   . Diabetes Son   . Diabetes Son     CURRENT MEDICATIONS:  Current Outpatient Medications  Medication Sig Dispense Refill  . Acetaminophen (TYLENOL) 325 MG CAPS Tylenol 325 mg capsule  Take 1 capsule 3 times a day by oral route.    [name] aspirin 81 MG tablet Take 81 mg by mouth daily.    . chlorthalidone (HYGROTON) 25 MG tablet TAKE 1/2 TABLET(12.5 MG) BY MOUTH DAILY 45 tablet 3  . cimetidine (TAGAMET) 400 MG tablet Take 400 mg by mouth 2 (two) times daily as  needed (indigestion).     . clopidogrel (PLAVIX) 75 MG tablet TAKE 1 TABLET(75 MG) BY MOUTH DAILY WITH BREAKFAST 30 tablet 9  . Cyanocobalamin (VITAMIN B-12) 500 MCG SUBL Place 500 mcg under the tongue daily.    . Deferasirox 180 MG TABS TAKE 4 TABLETS BY MOUTH DAILY 120 tablet 0  . losartan (COZAAR) 25 MG tablet TAKE ONE TABLET BY MOUTH EVERY DAY 90 tablet 3  . metoprolol succinate (TOPROL-XL) 100 MG 24 hr tablet Take 1 tablet (100 mg total) by mouth daily. Take with or immediately following a meal. 90 tablet 3  . nitroGLYCERIN (NITROSTAT) 0.4 MG SL tablet Take by mouth.    . Omega-3 Fatty Acids (FISH OIL PO) Take 1,000 mg by mouth daily.    . pravastatin (PRAVACHOL) 40 MG tablet TAKE 1 TABLET(40 MG) BY MOUTH DAILY 90 tablet 3  . Probiotic Product (PROBIOTIC PO) Take by mouth.    . traMADol (ULTRAM) 50 MG tablet TAKE 1 TABLET BY MOUTH EVERY 8 HOURS AS NEEDED 90 tablet 0  . Vitamin D, Ergocalciferol, (DRISDOL) 1.25 MG (50000 UNIT) CAPS capsule TAKE 1 CAPSULE BY MOUTH 1 TIME A WEEK 12 capsule 2   No current facility-administered medications for this visit.    ALLERGIES:  Allergies  Allergen Reactions  .  Diltiazem Hcl Hives and Rash  . Cardizem [Diltiazem Hcl]     PHYSICAL EXAM:  Performance status (ECOG): 0 - Asymptomatic  Vitals:   08/25/20 1430  BP: (!) 159/70  Pulse: 89  Resp: 20  Temp: (!) 97.2 F (36.2 C)  SpO2: 94%   Wt Readings from Last 3 Encounters:  08/25/20 212 lb 3.2 oz (96.3 kg)  06/23/20 211 lb (95.7 kg)  04/07/20 211 lb (95.7 kg)   Physical Exam Vitals reviewed.  Constitutional:      Appearance: Normal appearance.  Cardiovascular:     Rate and Rhythm: Normal rate and regular rhythm.     Pulses: Normal pulses.     Heart sounds: Normal heart sounds.  Pulmonary:     Effort: Pulmonary effort is normal.     Breath sounds: Normal breath sounds.  Abdominal:     Palpations: Abdomen is soft. There is no hepatomegaly, splenomegaly or mass.     Tenderness: There is no abdominal tenderness.     Hernia: No hernia is present.  Musculoskeletal:     Right lower leg: No edema.     Left lower leg: No edema.  Neurological:     General: No focal deficit present.     Mental Status: He is alert and oriented to person, place, and time.  Psychiatric:        Mood and Affect: Mood normal.        Behavior: Behavior normal.     LABORATORY DATA:  I have reviewed the labs as listed.  CBC Latest Ref Rng & Units 08/25/2020 06/16/2020 04/07/2020  WBC 4.0 - 10.5 K/uL 8.2 10.6(H) 9.0  Hemoglobin 13.0 - 17.0 g/dL 10.3(L) 10.1(L) 10.4(L)  Hematocrit 39.0 - 52.0 % 31.0(L) 31.0(L) 30.9(L)  Platelets 150 - 400 K/uL 321 330 341   CMP Latest Ref Rng & Units 08/25/2020 06/16/2020 04/07/2020  Glucose 70 - 99 mg/dL 96 100(H) 96  BUN 8 - 23 mg/dL 20 28(H) 31(H)  Creatinine 0.61 - 1.24 mg/dL 1.02 1.04 1.16  Sodium 135 - 145 mmol/L 136 134(L) 136  Potassium 3.5 - 5.1 mmol/L 3.7 3.8 3.7  Chloride 98 - 111 mmol/L 104 105 103  CO2 22 - 32 mmol/L 23 22 23   Calcium 8.9 - 10.3 mg/dL 9.1 8.8(L) 8.9  Total Protein 6.5 - 8.1 g/dL 7.4 7.1 7.3  Total Bilirubin 0.3 - 1.2 mg/dL 0.6 0.6 0.6  Alkaline  Phos 38 - 126 U/L 36(L) 32(L) 33(L)  AST 15 - 41 U/L 19 23 24   ALT 0 - 44 U/L 19 37 35      Component Value Date/Time   RBC 2.63 (L) 08/25/2020 1336   MCV 117.9 (H) 08/25/2020 1336   MCH 39.2 (H) 08/25/2020 1336   MCHC 33.2 08/25/2020 1336   RDW 14.1 08/25/2020 1336   LYMPHSABS 2.3 08/25/2020 1336   MONOABS 1.0 08/25/2020 1336   EOSABS 0.2 08/25/2020 1336   BASOSABS 0.1 08/25/2020 1336   Lab Results  Component Value Date   VD25OH 13.31 (L) 11/12/2019   Lab Results  Component Value Date   TIBC 273 06/16/2020   TIBC 254 04/07/2020   TIBC 265 03/10/2020   FERRITIN 599 (H) 06/16/2020   FERRITIN 805 (H) 04/07/2020   FERRITIN 771 (H) 03/10/2020   IRONPCTSAT 64 (H) 06/16/2020   IRONPCTSAT 67 (H) 04/07/2020   IRONPCTSAT 64 (H) 03/10/2020    DIAGNOSTIC IMAGING:  I have independently reviewed the scans and discussed with the patient. No results found.   ASSESSMENT:  1. Hereditary hemochromatosis: -Patient seen for elevated ferritin levels. -EMR evaluation shows hemochromatosis testing on 01/27/2019 which showed heterozygosity for C282Y and H63D variants. -Given the elevated ferritin levels above 1000 and the results of genetic testing, this is compatible with hereditary hemochromatosis. -Patient does have arthritis of the small joints of the hands and arthritis of the back. -No history of CHF but has CAD. No diabetes. -MRI of the liver on 12/11/2019 shows estimated liver iron concentration approximately 7.7 mg/g compatible with moderate liver iron. Estimated fat fraction ranges from 7-12%, more pronounced in the right lobe compared to left hepatic lobe. No focal hepatic lesions seen. No morphological changes of significant liver disease. -Jadenu 540 mg daily started on 12/24/2019, dose increased to 720 mg daily on 02/09/2020.  2. Macrocytic anemia: -Colonoscopy by Dr. Loletha Carrow on 02/17/2019 shows diverticulosis in the left colon, 12 mm polyp in the cecum, 4 to 6 mm polyp in the  ascending colon and 5 mm polyp in the descending colon. Pathology was consistent with tubular adenomas.   PLAN:  1. Hereditary hemochromatosis: -Continue Jadenu 720 mg in the mornings on an empty stomach. - He is tolerating it well.  No major side effects. - Reviewed labs from today which showed ferritin improved to 473 from 599.  Percent saturation is 67, previously 64.  LFTs are normal. - Continue Jadenu at the same dose.  RTC 2 months with labs.  2. Macrocytic anemia: -Hemoglobin today is 10.3 with MCV of 117.  Ferritin was 473.  3. Vitamin D deficiency: -Continue vitamin D 50,000 units daily.  Vitamin D level today 62.  Orders placed this encounter:  No orders of the defined types were placed in this encounter.    Derek Jack, MD [place] [phone]   I, Milinda Antis, am acting as a scribe for Dr. Sanda Linger.  I, Derek Jack MD, have reviewed the above documentation for accuracy and completeness, and I agree with the above.

## 2020-08-25 NOTE — Patient Instructions (Addendum)
[place] at [facility] Discharge Instructions  You were seen today by Dr. Delton Coombes. He went over your recent results. Continue taking Deferasirox on an empty stomach daily. Dr. Delton Coombes will see you back in 2 months for labs and follow up.   Thank you for choosing [place] at [facility] to provide your oncology and hematology care.  To afford each patient quality time with our provider, please arrive at least 15 minutes before your scheduled appointment time.   If you have a lab appointment with the [facility] please come in thru the Main Entrance and check in at the main information desk  You need to re-schedule your appointment should you arrive 10 or more minutes late.  We strive to give you quality time with our providers, and arriving late affects you and other patients whose appointments are after yours.  Also, if you no show three or more times for appointments you may be dismissed from the clinic at the providers discretion.     Again, thank you for choosing [facility]  Our hope is that these requests will decrease the amount of time that you wait before being seen by our physicians.       _____________________________________________________________  Should you have questions after your visit to [facility], please contact our office at [phone] between the hours of 8:00 a.m. and 4:30 p.m.  Voicemails left after 4:00 p.m. will not be returned until the following business day.  For prescription refill requests, have your pharmacy contact our office and allow 72 hours.    Cancer Center Support Programs:   > Cancer Support Group  2nd Tuesday of the month 1pm-2pm, Journey Room

## 2020-09-02 ENCOUNTER — Other Ambulatory Visit (HOSPITAL_COMMUNITY): Payer: Self-pay | Admitting: Hematology

## 2020-09-02 ENCOUNTER — Other Ambulatory Visit (HOSPITAL_COMMUNITY): Payer: Self-pay

## 2020-09-02 MED ORDER — DEFERASIROX 180 MG PO TABS
ORAL_TABLET | ORAL | 0 refills | Status: DC
  Filled 2020-09-02: qty 120, 30d supply, fill #0

## 2020-09-03 ENCOUNTER — Other Ambulatory Visit (HOSPITAL_COMMUNITY): Payer: Self-pay

## 2020-09-06 ENCOUNTER — Other Ambulatory Visit (HOSPITAL_COMMUNITY): Payer: Self-pay

## 2020-09-07 ENCOUNTER — Other Ambulatory Visit: Payer: Self-pay | Admitting: Internal Medicine

## 2020-09-08 NOTE — Telephone Encounter (Signed)
Patient called stating that he is out of the Tramadol and really needs this refilled today.

## 2020-09-08 NOTE — Telephone Encounter (Signed)
Last filled4-1-22 #60 Last OV11-30-21 Next OV8-5-22 [facility] [address] [facility] [employer]

## 2020-09-28 ENCOUNTER — Other Ambulatory Visit: Payer: Self-pay | Admitting: Cardiology

## 2020-10-05 ENCOUNTER — Other Ambulatory Visit (HOSPITAL_COMMUNITY): Payer: Self-pay | Admitting: Hematology

## 2020-10-05 ENCOUNTER — Other Ambulatory Visit (HOSPITAL_COMMUNITY): Payer: Self-pay

## 2020-10-05 MED ORDER — DEFERASIROX 180 MG PO TABS
ORAL_TABLET | ORAL | 0 refills | Status: DC
  Filled 2020-10-05: qty 120, 30d supply, fill #0

## 2020-10-06 ENCOUNTER — Other Ambulatory Visit (HOSPITAL_COMMUNITY): Payer: Self-pay

## 2020-10-07 ENCOUNTER — Other Ambulatory Visit: Payer: Self-pay | Admitting: Internal Medicine

## 2020-10-07 NOTE — Telephone Encounter (Signed)
Last filled5-4-22#60 Last OV11-30-21 Next OV8-5-22 [facility] [address] [facility] [employer]

## 2020-10-08 ENCOUNTER — Other Ambulatory Visit (HOSPITAL_COMMUNITY): Payer: Self-pay

## 2020-10-19 ENCOUNTER — Other Ambulatory Visit (HOSPITAL_COMMUNITY): Payer: Self-pay | Admitting: Cardiovascular Disease

## 2020-10-19 DIAGNOSIS — I6523 Occlusion and stenosis of bilateral carotid arteries: Secondary | ICD-10-CM

## 2020-10-26 ENCOUNTER — Ambulatory Visit (HOSPITAL_COMMUNITY)
Admission: RE | Admit: 2020-10-26 | Discharge: 2020-10-26 | Disposition: A | Discharge status: Home / Self Care | Payer: Medicare PPO | Source: Ambulatory Visit | Attending: Internal Medicine | Admitting: Internal Medicine

## 2020-10-26 ENCOUNTER — Ambulatory Visit (HOSPITAL_BASED_OUTPATIENT_CLINIC_OR_DEPARTMENT_OTHER)
Admission: RE | Admit: 2020-10-26 | Discharge: 2020-10-26 | Disposition: A | Discharge status: Home / Self Care | Payer: Medicare PPO | Source: Ambulatory Visit | Attending: Cardiovascular Disease | Admitting: Cardiovascular Disease

## 2020-10-26 ENCOUNTER — Ambulatory Visit (HOSPITAL_BASED_OUTPATIENT_CLINIC_OR_DEPARTMENT_OTHER)
Admission: RE | Admit: 2020-10-26 | Discharge: 2020-10-26 | Disposition: A | Discharge status: Home / Self Care | Payer: Medicare PPO | Source: Ambulatory Visit | Attending: Internal Medicine | Admitting: Internal Medicine

## 2020-10-26 ENCOUNTER — Other Ambulatory Visit: Payer: Self-pay

## 2020-10-26 ENCOUNTER — Other Ambulatory Visit (HOSPITAL_COMMUNITY): Payer: Self-pay | Admitting: Cardiovascular Disease

## 2020-10-26 DIAGNOSIS — I739 Peripheral vascular disease, unspecified: Secondary | ICD-10-CM

## 2020-10-26 DIAGNOSIS — Z95828 Presence of other vascular implants and grafts: Secondary | ICD-10-CM

## 2020-10-26 DIAGNOSIS — I6523 Occlusion and stenosis of bilateral carotid arteries: Secondary | ICD-10-CM | POA: Diagnosis not present

## 2020-10-26 DIAGNOSIS — Z95820 Peripheral vascular angioplasty status with implants and grafts: Secondary | ICD-10-CM

## 2020-10-27 ENCOUNTER — Ambulatory Visit (HOSPITAL_COMMUNITY): Payer: Medicare PPO | Admitting: Hematology

## 2020-10-27 ENCOUNTER — Encounter: Payer: Self-pay | Admitting: *Deleted

## 2020-10-27 ENCOUNTER — Other Ambulatory Visit (HOSPITAL_COMMUNITY): Payer: Medicare PPO

## 2020-10-29 ENCOUNTER — Other Ambulatory Visit: Payer: Self-pay

## 2020-10-29 DIAGNOSIS — I739 Peripheral vascular disease, unspecified: Secondary | ICD-10-CM

## 2020-10-29 DIAGNOSIS — Z95828 Presence of other vascular implants and grafts: Secondary | ICD-10-CM

## 2020-11-02 ENCOUNTER — Other Ambulatory Visit (HOSPITAL_COMMUNITY): Payer: Self-pay | Admitting: Hematology

## 2020-11-02 ENCOUNTER — Other Ambulatory Visit: Payer: Self-pay | Admitting: Internal Medicine

## 2020-11-02 ENCOUNTER — Ambulatory Visit (HOSPITAL_COMMUNITY): Payer: Medicare PPO | Admitting: Hematology

## 2020-11-02 ENCOUNTER — Other Ambulatory Visit (HOSPITAL_COMMUNITY): Payer: Medicare PPO

## 2020-11-02 ENCOUNTER — Other Ambulatory Visit (HOSPITAL_COMMUNITY): Payer: Self-pay

## 2020-11-02 MED ORDER — DEFERASIROX 180 MG PO TABS
ORAL_TABLET | ORAL | 0 refills | Status: DC
  Filled 2020-11-02: qty 120, 30d supply, fill #0

## 2020-11-02 NOTE — Telephone Encounter (Signed)
Last filled 10-07-20 #60 Last OV 04-06-20 Next OV 12-10-20 [facility] [address] [facility] [employer]

## 2020-11-03 ENCOUNTER — Other Ambulatory Visit (HOSPITAL_COMMUNITY): Payer: Self-pay

## 2020-11-05 ENCOUNTER — Other Ambulatory Visit (HOSPITAL_COMMUNITY): Payer: Self-pay | Admitting: *Deleted

## 2020-11-05 DIAGNOSIS — E559 Vitamin D deficiency, unspecified: Secondary | ICD-10-CM

## 2020-11-09 ENCOUNTER — Other Ambulatory Visit (HOSPITAL_COMMUNITY): Payer: Self-pay

## 2020-11-09 ENCOUNTER — Inpatient Hospital Stay (HOSPITAL_COMMUNITY): Payer: Medicare PPO | Attending: Hematology

## 2020-11-09 ENCOUNTER — Other Ambulatory Visit: Payer: Self-pay

## 2020-11-09 ENCOUNTER — Inpatient Hospital Stay (HOSPITAL_COMMUNITY): Payer: Medicare PPO | Admitting: Hematology and Oncology

## 2020-11-09 DIAGNOSIS — K5730 Diverticulosis of large intestine without perforation or abscess without bleeding: Secondary | ICD-10-CM | POA: Insufficient documentation

## 2020-11-09 DIAGNOSIS — Z8719 Personal history of other diseases of the digestive system: Secondary | ICD-10-CM | POA: Insufficient documentation

## 2020-11-09 DIAGNOSIS — I739 Peripheral vascular disease, unspecified: Secondary | ICD-10-CM | POA: Diagnosis not present

## 2020-11-09 DIAGNOSIS — Z9049 Acquired absence of other specified parts of digestive tract: Secondary | ICD-10-CM | POA: Insufficient documentation

## 2020-11-09 DIAGNOSIS — K219 Gastro-esophageal reflux disease without esophagitis: Secondary | ICD-10-CM | POA: Diagnosis not present

## 2020-11-09 DIAGNOSIS — G479 Sleep disorder, unspecified: Secondary | ICD-10-CM | POA: Diagnosis not present

## 2020-11-09 DIAGNOSIS — Z8249 Family history of ischemic heart disease and other diseases of the circulatory system: Secondary | ICD-10-CM | POA: Insufficient documentation

## 2020-11-09 DIAGNOSIS — R7989 Other specified abnormal findings of blood chemistry: Secondary | ICD-10-CM | POA: Insufficient documentation

## 2020-11-09 DIAGNOSIS — Z801 Family history of malignant neoplasm of trachea, bronchus and lung: Secondary | ICD-10-CM | POA: Insufficient documentation

## 2020-11-09 DIAGNOSIS — Z808 Family history of malignant neoplasm of other organs or systems: Secondary | ICD-10-CM | POA: Insufficient documentation

## 2020-11-09 DIAGNOSIS — I2510 Atherosclerotic heart disease of native coronary artery without angina pectoris: Secondary | ICD-10-CM | POA: Insufficient documentation

## 2020-11-09 DIAGNOSIS — M549 Dorsalgia, unspecified: Secondary | ICD-10-CM | POA: Insufficient documentation

## 2020-11-09 DIAGNOSIS — Z8349 Family history of other endocrine, nutritional and metabolic diseases: Secondary | ICD-10-CM | POA: Insufficient documentation

## 2020-11-09 DIAGNOSIS — Z833 Family history of diabetes mellitus: Secondary | ICD-10-CM | POA: Insufficient documentation

## 2020-11-09 DIAGNOSIS — Z79899 Other long term (current) drug therapy: Secondary | ICD-10-CM | POA: Insufficient documentation

## 2020-11-09 DIAGNOSIS — R5383 Other fatigue: Secondary | ICD-10-CM | POA: Insufficient documentation

## 2020-11-09 DIAGNOSIS — Z87891 Personal history of nicotine dependence: Secondary | ICD-10-CM | POA: Insufficient documentation

## 2020-11-09 DIAGNOSIS — D539 Nutritional anemia, unspecified: Secondary | ICD-10-CM | POA: Insufficient documentation

## 2020-11-09 DIAGNOSIS — E559 Vitamin D deficiency, unspecified: Secondary | ICD-10-CM | POA: Diagnosis not present

## 2020-11-09 LAB — COMPREHENSIVE METABOLIC PANEL
ALT: 15 U/L (ref 0–44)
AST: 16 U/L (ref 15–41)
Albumin: 3.8 g/dL (ref 3.5–5.0)
Alkaline Phosphatase: 36 U/L — ABNORMAL LOW (ref 38–126)
Anion gap: 8 (ref 5–15)
BUN: 22 mg/dL (ref 8–23)
CO2: 24 mmol/L (ref 22–32)
Calcium: 8.9 mg/dL (ref 8.9–10.3)
Chloride: 105 mmol/L (ref 98–111)
Creatinine, Ser: 1.03 mg/dL (ref 0.61–1.24)
GFR, Estimated: >60 mL/min (ref >60)
Glucose, Bld: 110 mg/dL — ABNORMAL HIGH (ref 70–99)
Potassium: 3.6 mmol/L (ref 3.5–5.1)
Sodium: 137 mmol/L (ref 135–145)
Total Bilirubin: 0.5 mg/dL (ref 0.3–1.2)
Total Protein: 7.3 g/dL (ref 6.5–8.1)

## 2020-11-09 LAB — CBC WITH DIFFERENTIAL/PLATELET
Abs Immature Granulocytes: 0.03 10*3/uL (ref 0.00–0.07)
Basophils Absolute: 0.1 10*3/uL (ref 0.0–0.1)
Basophils Relative: 1 %
Eosinophils Absolute: 0.2 10*3/uL (ref 0.0–0.5)
Eosinophils Relative: 3 %
HCT: 30 % — ABNORMAL LOW (ref 39.0–52.0)
Hemoglobin: 10 g/dL — ABNORMAL LOW (ref 13.0–17.0)
Immature Granulocytes: 0 %
Lymphocytes Relative: 32 %
Lymphs Abs: 2.7 10*3/uL (ref 0.7–4.0)
MCH: 39.1 pg — ABNORMAL HIGH (ref 26.0–34.0)
MCHC: 33.3 g/dL (ref 30.0–36.0)
MCV: 117.2 fL — ABNORMAL HIGH (ref 80.0–100.0)
Monocytes Absolute: 1 10*3/uL (ref 0.1–1.0)
Monocytes Relative: 12 %
Neutro Abs: 4.2 10*3/uL (ref 1.7–7.7)
Neutrophils Relative %: 52 %
Platelets: 299 10*3/uL (ref 150–400)
RBC: 2.56 MIL/uL — ABNORMAL LOW (ref 4.22–5.81)
RDW: 14.8 % (ref 11.5–15.5)
WBC: 8.3 10*3/uL (ref 4.0–10.5)
nRBC: 0.5 % — ABNORMAL HIGH (ref 0.0–0.2)

## 2020-11-09 LAB — IRON AND TIBC
Iron: 176 ug/dL (ref 45–182)
Saturation Ratios: 73 % — ABNORMAL HIGH (ref 17.9–39.5)
TIBC: 242 ug/dL — ABNORMAL LOW (ref 250–450)
UIBC: 66 ug/dL

## 2020-11-09 LAB — VITAMIN D 25 HYDROXY (VIT D DEFICIENCY, FRACTURES): Vit D, 25-Hydroxy: 73.68 ng/mL (ref 30–100)

## 2020-11-09 LAB — FERRITIN: Ferritin: 387 ng/mL — ABNORMAL HIGH (ref 24–336)

## 2020-11-09 NOTE — Progress Notes (Signed)
Johnsie Cancel MD on 10/26/2020 at 1:29:22 PM.    Final    VAS US CAROTID  Result Date: 10/26/2020 Carotid Arterial Duplex Study Patient Name:  Aaron Sexton  Date of Exam:   10/26/2020 Medical Rec #: [id number]        Accession #:    [id number] Date of Birth: 13-Mar-1946       Patient Gender: M Patient Age:   74Y Exam Location:  [facility] Procedure:      VAS US CAROTID Referring Phys: [id number] J BERRY  --------------------------------------------------------------------------------  Indications:       Carotid artery disease and Patient denies any cerebrovascular                    symptoms. Risk Factors:      Hypertension, hyperlipidemia, past history of smoking, PAD. Comparison Study:  01/6282-MOQH peak systolic velocity was 476 cm/s; LICA peak                    systolic velocity was 546 cm/s. Performing Technologist: Wilkie Aye RVT  Examination Guidelines: A complete evaluation includes B-mode imaging, spectral Doppler, color Doppler, and power Doppler as needed of all accessible portions of each vessel. Bilateral testing is considered an integral part of a complete examination. Limited examinations for reoccurring indications may be performed as noted.  Right Carotid Findings: +----------+--------+--------+--------+--------------------------+--------+           PSV cm/sEDV cm/sStenosisPlaque Description        Comments +----------+--------+--------+--------+--------------------------+--------+ CCA Prox  112     14                                                 +----------+--------+--------+--------+--------------------------+--------+ CCA Distal67      16                                                 +----------+--------+--------+--------+--------------------------+--------+ ICA Prox  132     31      1-39%   heterogenous and irregular         +----------+--------+--------+--------+--------------------------+--------+ ICA Mid   157     26                                                 +----------+--------+--------+--------+--------------------------+--------+ ICA Distal102     27                                                 +----------+--------+--------+--------+--------------------------+--------+ ECA       154     8               heterogenous and irregular         +----------+--------+--------+--------+--------------------------+--------+  +----------+--------+-------+---------+-------------------+           PSV cm/sEDV cmsDescribe Arm Pressure (mmHG) +----------+--------+-------+---------+-------------------+ [id number]            Turbulent134                 +----------+--------+-------+---------+-------------------+ +---------+--------+--+--------+--+---------+  CMP Latest Ref Rng & Units 11/09/2020 08/25/2020 06/16/2020  Glucose 70 - 99 mg/dL 110(H) 96 100(H)  BUN 8 - 23 mg/dL 22 20 28(H)  Creatinine 0.61 - 1.24 mg/dL 1.03 1.02 1.04  Sodium 135 - 145 mmol/L 137 136 134(L)  Potassium 3.5 -  5.1 mmol/L 3.6 3.7 3.8  Chloride 98 - 111 mmol/L 105 104 105  CO2 22 - 32 mmol/L 24 23 22   Calcium 8.9 - 10.3 mg/dL 8.9 9.1 8.8(L)  Total Protein 6.5 - 8.1 g/dL 7.3 7.4 7.1  Total Bilirubin 0.3 - 1.2 mg/dL 0.5 0.6 0.6  Alkaline Phos 38 - 126 U/L 36(L) 36(L) 32(L)  AST 15 - 41 U/L 16 19 23   ALT 0 - 44 U/L 15 19 37      Component Value Date/Time   RBC 2.56 (L) 11/09/2020 1321   MCV 117.2 (H) 11/09/2020 1321   MCH 39.1 (H) 11/09/2020 1321   MCHC 33.3 11/09/2020 1321   RDW 14.8 11/09/2020 1321   LYMPHSABS 2.7 11/09/2020 1321   MONOABS 1.0 11/09/2020 1321   EOSABS 0.2 11/09/2020 1321   BASOSABS 0.1 11/09/2020 1321   Lab Results  Component Value Date   VD25OH 73.68 11/09/2020   VD25OH 62.56 08/25/2020   VD25OH 13.31 (L) 11/12/2019   Lab Results  Component Value Date   TIBC 242 (L) 11/09/2020   TIBC 263 08/25/2020   TIBC 273 06/16/2020   FERRITIN 387 (H) 11/09/2020   FERRITIN 473 (H) 08/25/2020   FERRITIN 599 (H) 06/16/2020   IRONPCTSAT 73 (H) 11/09/2020   IRONPCTSAT 67 (H) 08/25/2020   IRONPCTSAT 64 (H) 06/16/2020    DIAGNOSTIC IMAGING:  I have independently reviewed the scans and discussed with the patient. VAS [place] ABI WITH/WO TBI  Result Date: 10/26/2020  LOWER EXTREMITY DOPPLER STUDY Patient Name:  Aaron Sexton  Date of Exam:   10/26/2020 Medical Rec #: [id number]        Accession #:    [id number] Date of Birth: June 19, 1945       Patient Gender: M Patient Age:   89Y Exam Location:  [facility] Procedure:      VAS [place] ABI WITH/WO TBI Referring Phys: [id number] JONATHAN J BERRY --------------------------------------------------------------------------------  Indications: Claudication, peripheral artery disease, and Known bilateral SFA              occlusions. High Risk Factors: Hypertension, hyperlipidemia, past history of smoking. Other Factors: Patient states he feels right hip pain when walking about two                blocks and is relieved with rest. His pain has been ongoing for                 several years. Patient ambulates with cane and walker. He states                he feels more secure walking with his walker.  Vascular Interventions: 08/09/2015 PTA and stent placed in right EIA. Comparison Study: 10/2019-Right ABI was 0.82 and left 0.57 Performing Technologist: Wilkie Aye RVT  Examination Guidelines: A complete evaluation includes at minimum, Doppler waveform signals and systolic blood pressure reading at the level of bilateral brachial, anterior tibial, and posterior tibial arteries, when vessel segments are accessible. Bilateral testing is considered an integral part of a complete examination. Photoelectric Plethysmograph (PPG) waveforms and toe systolic pressure readings are included as required and additional duplex testing as needed. Limited examinations for reoccurring indications may be performed as  Johnsie Cancel MD on 10/26/2020 at 1:29:22 PM.    Final    VAS US CAROTID  Result Date: 10/26/2020 Carotid Arterial Duplex Study Patient Name:  Aaron Sexton  Date of Exam:   10/26/2020 Medical Rec #: [id number]        Accession #:    [id number] Date of Birth: 13-Mar-1946       Patient Gender: M Patient Age:   74Y Exam Location:  [facility] Procedure:      VAS US CAROTID Referring Phys: [id number] J BERRY  --------------------------------------------------------------------------------  Indications:       Carotid artery disease and Patient denies any cerebrovascular                    symptoms. Risk Factors:      Hypertension, hyperlipidemia, past history of smoking, PAD. Comparison Study:  01/6282-MOQH peak systolic velocity was 476 cm/s; LICA peak                    systolic velocity was 546 cm/s. Performing Technologist: Wilkie Aye RVT  Examination Guidelines: A complete evaluation includes B-mode imaging, spectral Doppler, color Doppler, and power Doppler as needed of all accessible portions of each vessel. Bilateral testing is considered an integral part of a complete examination. Limited examinations for reoccurring indications may be performed as noted.  Right Carotid Findings: +----------+--------+--------+--------+--------------------------+--------+           PSV cm/sEDV cm/sStenosisPlaque Description        Comments +----------+--------+--------+--------+--------------------------+--------+ CCA Prox  112     14                                                 +----------+--------+--------+--------+--------------------------+--------+ CCA Distal67      16                                                 +----------+--------+--------+--------+--------------------------+--------+ ICA Prox  132     31      1-39%   heterogenous and irregular         +----------+--------+--------+--------+--------------------------+--------+ ICA Mid   157     26                                                 +----------+--------+--------+--------+--------------------------+--------+ ICA Distal102     27                                                 +----------+--------+--------+--------+--------------------------+--------+ ECA       154     8               heterogenous and irregular         +----------+--------+--------+--------+--------------------------+--------+  +----------+--------+-------+---------+-------------------+           PSV cm/sEDV cmsDescribe Arm Pressure (mmHG) +----------+--------+-------+---------+-------------------+ [id number]            Turbulent134                 +----------+--------+-------+---------+-------------------+ +---------+--------+--+--------+--+---------+  CMP Latest Ref Rng & Units 11/09/2020 08/25/2020 06/16/2020  Glucose 70 - 99 mg/dL 110(H) 96 100(H)  BUN 8 - 23 mg/dL 22 20 28(H)  Creatinine 0.61 - 1.24 mg/dL 1.03 1.02 1.04  Sodium 135 - 145 mmol/L 137 136 134(L)  Potassium 3.5 -  5.1 mmol/L 3.6 3.7 3.8  Chloride 98 - 111 mmol/L 105 104 105  CO2 22 - 32 mmol/L 24 23 22   Calcium 8.9 - 10.3 mg/dL 8.9 9.1 8.8(L)  Total Protein 6.5 - 8.1 g/dL 7.3 7.4 7.1  Total Bilirubin 0.3 - 1.2 mg/dL 0.5 0.6 0.6  Alkaline Phos 38 - 126 U/L 36(L) 36(L) 32(L)  AST 15 - 41 U/L 16 19 23   ALT 0 - 44 U/L 15 19 37      Component Value Date/Time   RBC 2.56 (L) 11/09/2020 1321   MCV 117.2 (H) 11/09/2020 1321   MCH 39.1 (H) 11/09/2020 1321   MCHC 33.3 11/09/2020 1321   RDW 14.8 11/09/2020 1321   LYMPHSABS 2.7 11/09/2020 1321   MONOABS 1.0 11/09/2020 1321   EOSABS 0.2 11/09/2020 1321   BASOSABS 0.1 11/09/2020 1321   Lab Results  Component Value Date   VD25OH 73.68 11/09/2020   VD25OH 62.56 08/25/2020   VD25OH 13.31 (L) 11/12/2019   Lab Results  Component Value Date   TIBC 242 (L) 11/09/2020   TIBC 263 08/25/2020   TIBC 273 06/16/2020   FERRITIN 387 (H) 11/09/2020   FERRITIN 473 (H) 08/25/2020   FERRITIN 599 (H) 06/16/2020   IRONPCTSAT 73 (H) 11/09/2020   IRONPCTSAT 67 (H) 08/25/2020   IRONPCTSAT 64 (H) 06/16/2020    DIAGNOSTIC IMAGING:  I have independently reviewed the scans and discussed with the patient. VAS [place] ABI WITH/WO TBI  Result Date: 10/26/2020  LOWER EXTREMITY DOPPLER STUDY Patient Name:  Aaron Sexton  Date of Exam:   10/26/2020 Medical Rec #: [id number]        Accession #:    [id number] Date of Birth: June 19, 1945       Patient Gender: M Patient Age:   89Y Exam Location:  [facility] Procedure:      VAS [place] ABI WITH/WO TBI Referring Phys: [id number] JONATHAN J BERRY --------------------------------------------------------------------------------  Indications: Claudication, peripheral artery disease, and Known bilateral SFA              occlusions. High Risk Factors: Hypertension, hyperlipidemia, past history of smoking. Other Factors: Patient states he feels right hip pain when walking about two                blocks and is relieved with rest. His pain has been ongoing for                 several years. Patient ambulates with cane and walker. He states                he feels more secure walking with his walker.  Vascular Interventions: 08/09/2015 PTA and stent placed in right EIA. Comparison Study: 10/2019-Right ABI was 0.82 and left 0.57 Performing Technologist: Wilkie Aye RVT  Examination Guidelines: A complete evaluation includes at minimum, Doppler waveform signals and systolic blood pressure reading at the level of bilateral brachial, anterior tibial, and posterior tibial arteries, when vessel segments are accessible. Bilateral testing is considered an integral part of a complete examination. Photoelectric Plethysmograph (PPG) waveforms and toe systolic pressure readings are included as required and additional duplex testing as needed. Limited examinations for reoccurring indications may be performed as  CMP Latest Ref Rng & Units 11/09/2020 08/25/2020 06/16/2020  Glucose 70 - 99 mg/dL 110(H) 96 100(H)  BUN 8 - 23 mg/dL 22 20 28(H)  Creatinine 0.61 - 1.24 mg/dL 1.03 1.02 1.04  Sodium 135 - 145 mmol/L 137 136 134(L)  Potassium 3.5 -  5.1 mmol/L 3.6 3.7 3.8  Chloride 98 - 111 mmol/L 105 104 105  CO2 22 - 32 mmol/L 24 23 22   Calcium 8.9 - 10.3 mg/dL 8.9 9.1 8.8(L)  Total Protein 6.5 - 8.1 g/dL 7.3 7.4 7.1  Total Bilirubin 0.3 - 1.2 mg/dL 0.5 0.6 0.6  Alkaline Phos 38 - 126 U/L 36(L) 36(L) 32(L)  AST 15 - 41 U/L 16 19 23   ALT 0 - 44 U/L 15 19 37      Component Value Date/Time   RBC 2.56 (L) 11/09/2020 1321   MCV 117.2 (H) 11/09/2020 1321   MCH 39.1 (H) 11/09/2020 1321   MCHC 33.3 11/09/2020 1321   RDW 14.8 11/09/2020 1321   LYMPHSABS 2.7 11/09/2020 1321   MONOABS 1.0 11/09/2020 1321   EOSABS 0.2 11/09/2020 1321   BASOSABS 0.1 11/09/2020 1321   Lab Results  Component Value Date   VD25OH 73.68 11/09/2020   VD25OH 62.56 08/25/2020   VD25OH 13.31 (L) 11/12/2019   Lab Results  Component Value Date   TIBC 242 (L) 11/09/2020   TIBC 263 08/25/2020   TIBC 273 06/16/2020   FERRITIN 387 (H) 11/09/2020   FERRITIN 473 (H) 08/25/2020   FERRITIN 599 (H) 06/16/2020   IRONPCTSAT 73 (H) 11/09/2020   IRONPCTSAT 67 (H) 08/25/2020   IRONPCTSAT 64 (H) 06/16/2020    DIAGNOSTIC IMAGING:  I have independently reviewed the scans and discussed with the patient. VAS [place] ABI WITH/WO TBI  Result Date: 10/26/2020  LOWER EXTREMITY DOPPLER STUDY Patient Name:  Aaron Sexton  Date of Exam:   10/26/2020 Medical Rec #: [id number]        Accession #:    [id number] Date of Birth: June 19, 1945       Patient Gender: M Patient Age:   89Y Exam Location:  [facility] Procedure:      VAS [place] ABI WITH/WO TBI Referring Phys: [id number] JONATHAN J BERRY --------------------------------------------------------------------------------  Indications: Claudication, peripheral artery disease, and Known bilateral SFA              occlusions. High Risk Factors: Hypertension, hyperlipidemia, past history of smoking. Other Factors: Patient states he feels right hip pain when walking about two                blocks and is relieved with rest. His pain has been ongoing for                 several years. Patient ambulates with cane and walker. He states                he feels more secure walking with his walker.  Vascular Interventions: 08/09/2015 PTA and stent placed in right EIA. Comparison Study: 10/2019-Right ABI was 0.82 and left 0.57 Performing Technologist: Wilkie Aye RVT  Examination Guidelines: A complete evaluation includes at minimum, Doppler waveform signals and systolic blood pressure reading at the level of bilateral brachial, anterior tibial, and posterior tibial arteries, when vessel segments are accessible. Bilateral testing is considered an integral part of a complete examination. Photoelectric Plethysmograph (PPG) waveforms and toe systolic pressure readings are included as required and additional duplex testing as needed. Limited examinations for reoccurring indications may be performed as  Johnsie Cancel MD on 10/26/2020 at 1:29:22 PM.    Final    VAS US CAROTID  Result Date: 10/26/2020 Carotid Arterial Duplex Study Patient Name:  Aaron Sexton  Date of Exam:   10/26/2020 Medical Rec #: [id number]        Accession #:    [id number] Date of Birth: 13-Mar-1946       Patient Gender: M Patient Age:   74Y Exam Location:  [facility] Procedure:      VAS US CAROTID Referring Phys: [id number] Rod[address] J BERRY  --------------------------------------------------------------------------------  Indications:       Carotid artery disease and Patient denies any cerebrovascular                    symptoms. Risk Factors:      Hypertension, hyperlipidemia, past history of smoking, PAD. Comparison Study:  01/6282-MOQH peak systolic velocity was 476 cm/s; LICA peak                    systolic velocity was 546 cm/s. Performing Technologist: Wilkie Aye RVT  Examination Guidelines: A complete evaluation includes B-mode imaging, spectral Doppler, color Doppler, and power Doppler as needed of all accessible portions of each vessel. Bilateral testing is considered an integral part of a complete examination. Limited examinations for reoccurring indications may be performed as noted.  Right Carotid Findings: +----------+--------+--------+--------+--------------------------+--------+           PSV cm/sEDV cm/sStenosisPlaque Description        Comments +----------+--------+--------+--------+--------------------------+--------+ CCA Prox  112     14                                                 +----------+--------+--------+--------+--------------------------+--------+ CCA Distal67      16                                                 +----------+--------+--------+--------+--------------------------+--------+ ICA Prox  132     31      1-39%   heterogenous and irregular         +----------+--------+--------+--------+--------------------------+--------+ ICA Mid   157     26                                                 +----------+--------+--------+--------+--------------------------+--------+ ICA Distal102     27                                                 +----------+--------+--------+--------+--------------------------+--------+ ECA       154     8               heterogenous and irregular         +----------+--------+--------+--------+--------------------------+--------+  +----------+--------+-------+---------+-------------------+           PSV cm/sEDV cmsDescribe Arm Pressure (mmHG) +----------+--------+-------+---------+-------------------+ [id number]            Turbulent134                 +----------+--------+-------+---------+-------------------+ +---------+--------+--+--------+--+---------+  CMP Latest Ref Rng & Units 11/09/2020 08/25/2020 06/16/2020  Glucose 70 - 99 mg/dL 110(H) 96 100(H)  BUN 8 - 23 mg/dL 22 20 28(H)  Creatinine 0.61 - 1.24 mg/dL 1.03 1.02 1.04  Sodium 135 - 145 mmol/L 137 136 134(L)  Potassium 3.5 -  5.1 mmol/L 3.6 3.7 3.8  Chloride 98 - 111 mmol/L 105 104 105  CO2 22 - 32 mmol/L 24 23 22   Calcium 8.9 - 10.3 mg/dL 8.9 9.1 8.8(L)  Total Protein 6.5 - 8.1 g/dL 7.3 7.4 7.1  Total Bilirubin 0.3 - 1.2 mg/dL 0.5 0.6 0.6  Alkaline Phos 38 - 126 U/L 36(L) 36(L) 32(L)  AST 15 - 41 U/L 16 19 23   ALT 0 - 44 U/L 15 19 37      Component Value Date/Time   RBC 2.56 (L) 11/09/2020 1321   MCV 117.2 (H) 11/09/2020 1321   MCH 39.1 (H) 11/09/2020 1321   MCHC 33.3 11/09/2020 1321   RDW 14.8 11/09/2020 1321   LYMPHSABS 2.7 11/09/2020 1321   MONOABS 1.0 11/09/2020 1321   EOSABS 0.2 11/09/2020 1321   BASOSABS 0.1 11/09/2020 1321   Lab Results  Component Value Date   VD25OH 73.68 11/09/2020   VD25OH 62.56 08/25/2020   VD25OH 13.31 (L) 11/12/2019   Lab Results  Component Value Date   TIBC 242 (L) 11/09/2020   TIBC 263 08/25/2020   TIBC 273 06/16/2020   FERRITIN 387 (H) 11/09/2020   FERRITIN 473 (H) 08/25/2020   FERRITIN 599 (H) 06/16/2020   IRONPCTSAT 73 (H) 11/09/2020   IRONPCTSAT 67 (H) 08/25/2020   IRONPCTSAT 64 (H) 06/16/2020    DIAGNOSTIC IMAGING:  I have independently reviewed the scans and discussed with the patient. VAS [place] ABI WITH/WO TBI  Result Date: 10/26/2020  LOWER EXTREMITY DOPPLER STUDY Patient Name:  Aaron Sexton  Date of Exam:   10/26/2020 Medical Rec #: [id number]        Accession #:    [id number] Date of Birth: June 19, 1945       Patient Gender: M Patient Age:   89Y Exam Location:  [facility] Procedure:      VAS [place] ABI WITH/WO TBI Referring Phys: [id number] JONATHAN J BERRY --------------------------------------------------------------------------------  Indications: Claudication, peripheral artery disease, and Known bilateral SFA              occlusions. High Risk Factors: Hypertension, hyperlipidemia, past history of smoking. Other Factors: Patient states he feels right hip pain when walking about two                blocks and is relieved with rest. His pain has been ongoing for                 several years. Patient ambulates with cane and walker. He states                he feels more secure walking with his walker.  Vascular Interventions: 08/09/2015 PTA and stent placed in right EIA. Comparison Study: 10/2019-Right ABI was 0.82 and left 0.57 Performing Technologist: Wilkie Aye RVT  Examination Guidelines: A complete evaluation includes at minimum, Doppler waveform signals and systolic blood pressure reading at the level of bilateral brachial, anterior tibial, and posterior tibial arteries, when vessel segments are accessible. Bilateral testing is considered an integral part of a complete examination. Photoelectric Plethysmograph (PPG) waveforms and toe systolic pressure readings are included as required and additional duplex testing as needed. Limited examinations for reoccurring indications may be performed as  Johnsie Cancel MD on 10/26/2020 at 1:29:22 PM.    Final    VAS US CAROTID  Result Date: 10/26/2020 Carotid Arterial Duplex Study Patient Name:  Aaron Sexton  Date of Exam:   10/26/2020 Medical Rec #: [id number]        Accession #:    [id number] Date of Birth: 13-Mar-1946       Patient Gender: M Patient Age:   74Y Exam Location:  [facility] Procedure:      VAS US CAROTID Referring Phys: [id number] Roderic [address] J BERRY  --------------------------------------------------------------------------------  Indications:       Carotid artery disease and Patient denies any cerebrovascular                    symptoms. Risk Factors:      Hypertension, hyperlipidemia, past history of smoking, PAD. Comparison Study:  01/6282-MOQH peak systolic velocity was 476 cm/s; LICA peak                    systolic velocity was 546 cm/s. Performing Technologist: Wilkie Aye RVT  Examination Guidelines: A complete evaluation includes B-mode imaging, spectral Doppler, color Doppler, and power Doppler as needed of all accessible portions of each vessel. Bilateral testing is considered an integral part of a complete examination. Limited examinations for reoccurring indications may be performed as noted.  Right Carotid Findings: +----------+--------+--------+--------+--------------------------+--------+           PSV cm/sEDV cm/sStenosisPlaque Description        Comments +----------+--------+--------+--------+--------------------------+--------+ CCA Prox  112     14                                                 +----------+--------+--------+--------+--------------------------+--------+ CCA Distal67      16                                                 +----------+--------+--------+--------+--------------------------+--------+ ICA Prox  132     31      1-39%   heterogenous and irregular         +----------+--------+--------+--------+--------------------------+--------+ ICA Mid   157     26                                                 +----------+--------+--------+--------+--------------------------+--------+ ICA Distal102     27                                                 +----------+--------+--------+--------+--------------------------+--------+ ECA       154     8               heterogenous and irregular         +----------+--------+--------+--------+--------------------------+--------+  +----------+--------+-------+---------+-------------------+           PSV cm/sEDV cmsDescribe Arm Pressure (mmHG) +----------+--------+-------+---------+-------------------+ [id number]            Turbulent134                 +----------+--------+-------+---------+-------------------+ +---------+--------+--+--------+--+---------+  VertebralPSV cm/s48EDV cm/s14Antegrade +---------+--------+--+--------+--+---------+  Left Carotid Findings: +----------+--------+--------+--------+--------------------------+--------+           PSV cm/sEDV cm/sStenosisPlaque Description        Comments +----------+--------+--------+--------+--------------------------+--------+ CCA Prox  63      13                                                 +----------+--------+--------+--------+--------------------------+--------+ CCA Distal76      13                                                 +----------+--------+--------+--------+--------------------------+--------+ ICA Prox  141     33      1-39%   heterogenous and irregular         +----------+--------+--------+--------+--------------------------+--------+ ICA Mid   125     26                                                 +----------+--------+--------+--------+--------------------------+--------+ ICA Distal103     21                                                 +----------+--------+--------+--------+--------------------------+--------+ ECA       269     32      >50%    heterogenous and irregular         +----------+--------+--------+--------+--------------------------+--------+ +----------+--------+--------+----------------+-------------------+           PSV cm/sEDV cm/sDescribe        Arm Pressure (mmHG) +----------+--------+--------+----------------+-------------------+ [id number]             Multiphasic, [id number]                 +----------+--------+--------+----------------+-------------------+  +---------+--------+--+--------+-+---------+ VertebralPSV cm/s44EDV cm/s8Antegrade +---------+--------+--+--------+-+---------+   Summary: Right Carotid: Velocities in the right ICA are consistent with a 1-39% stenosis. Left Carotid: Velocities in the left ICA are consistent with a 1-39% stenosis.               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 Vertebrals:  Bilateral vertebral arteries demonstrate antegrade flow. Subclavians: Right subclavian artery flow was disturbed. Normal flow              hemodynamics were seen in the left subclavian artery. *See table(s) above for measurements and observations. Suggest follow up study in 12 months. Electronically signed by Jenkins Rouge MD on 10/26/2020 at 1:28:57 PM.    Final    VAS US AORTA/IVC/ILIACS  Result Date: 10/26/2020 ABDOMINAL AORTA STUDY Patient Name:  Aaron Sexton  Date of Exam:   10/26/2020 Medical Rec #: [id number]        Accession #:    [id number] Date of Birth: 17-Nov-1945       Patient Gender: M Patient Age:   74Y Exam Location:  [facility] Procedure:      VAS US AORTA/IVC/ILIACS Referring Phys: Mallard --------------------------------------------------------------------------------  Risk Factors: Hypertension, hyperlipidemia, past history of smoking. Other Factors: Patient states he feels

## 2020-11-11 ENCOUNTER — Other Ambulatory Visit (HOSPITAL_COMMUNITY): Payer: Self-pay

## 2020-11-22 ENCOUNTER — Other Ambulatory Visit (HOSPITAL_COMMUNITY): Payer: Self-pay

## 2020-11-24 ENCOUNTER — Other Ambulatory Visit (HOSPITAL_COMMUNITY): Payer: Self-pay

## 2020-11-27 ENCOUNTER — Other Ambulatory Visit: Payer: Self-pay | Admitting: Internal Medicine

## 2020-12-02 ENCOUNTER — Other Ambulatory Visit (HOSPITAL_COMMUNITY): Payer: Self-pay | Admitting: Physician Assistant

## 2020-12-02 ENCOUNTER — Other Ambulatory Visit (HOSPITAL_COMMUNITY): Payer: Self-pay

## 2020-12-02 MED ORDER — DEFERASIROX 180 MG PO TABS
ORAL_TABLET | ORAL | 0 refills | Status: DC
  Filled 2020-12-02: qty 120, 30d supply, fill #0

## 2020-12-03 ENCOUNTER — Other Ambulatory Visit (HOSPITAL_COMMUNITY): Payer: Self-pay

## 2020-12-03 ENCOUNTER — Encounter: Payer: Medicare PPO | Admitting: Internal Medicine

## 2020-12-06 ENCOUNTER — Other Ambulatory Visit: Payer: Self-pay | Admitting: Internal Medicine

## 2020-12-06 NOTE — Telephone Encounter (Signed)
Last filled 11-03-20 #60 Last OV 04-06-20 Next OV 12-10-20 [facility] [address] [employer]

## 2020-12-07 ENCOUNTER — Other Ambulatory Visit (HOSPITAL_COMMUNITY): Payer: Self-pay

## 2020-12-10 ENCOUNTER — Ambulatory Visit: Payer: Medicare PPO | Admitting: Internal Medicine

## 2020-12-10 ENCOUNTER — Encounter: Payer: Self-pay | Admitting: Internal Medicine

## 2020-12-10 ENCOUNTER — Other Ambulatory Visit: Payer: Self-pay

## 2020-12-10 VITALS — BP 126/64 | HR 71 | Temp 98.0°F | Ht 72.0 in | Wt 208.0 lb

## 2020-12-10 DIAGNOSIS — K219 Gastro-esophageal reflux disease without esophagitis: Secondary | ICD-10-CM

## 2020-12-10 DIAGNOSIS — I739 Peripheral vascular disease, unspecified: Secondary | ICD-10-CM | POA: Diagnosis not present

## 2020-12-10 DIAGNOSIS — Z0000 Encounter for general adult medical examination without abnormal findings: Secondary | ICD-10-CM

## 2020-12-10 DIAGNOSIS — Z7189 Other specified counseling: Secondary | ICD-10-CM | POA: Diagnosis not present

## 2020-12-10 DIAGNOSIS — I6523 Occlusion and stenosis of bilateral carotid arteries: Secondary | ICD-10-CM | POA: Diagnosis not present

## 2020-12-10 NOTE — Assessment & Plan Note (Signed)
Using the desferoxamine

## 2020-12-10 NOTE — Assessment & Plan Note (Signed)
Continues with Dr Henrene Pastor ASA, statin

## 2020-12-10 NOTE — Assessment & Plan Note (Signed)
I have personally reviewed the Medicare Annual Wellness questionnaire and have noted 1. The patient's medical and social history 2. Their use of alcohol, tobacco or illicit drugs 3. Their current medications and supplements 4. The patient's functional ability including ADL's, fall risks, home safety risks and hearing or visual             impairment. 5. Diet and physical activities 6. Evidence for depression or mood disorders  The patients weight, height, BMI and visual acuity have been recorded in the chart I have made referrals, counseling and provided education to the patient based review of the above and I have provided the pt with a written personalized care plan for preventive services.  I have provided you with a copy of your personalized plan for preventive services. Please take the time to review along with your updated medication list.  Done with cancer screening Discussed exercise Needs COVID booster Flu vaccine in the fall Td if any injury

## 2020-12-10 NOTE — Assessment & Plan Note (Signed)
Uses the cimetidine rarely

## 2020-12-10 NOTE — Progress Notes (Signed)
Hearing Screening  Method: Audiometry   500Hz 1000Hz 2000Hz 4000Hz  Right ear 20 20 20 20  Left ear 20 20 20 20  Vision Screening - Comments:: April 2022  

## 2020-12-10 NOTE — Assessment & Plan Note (Signed)
Recent duplex shows stable disease Is on statin, ASA

## 2020-12-10 NOTE — Progress Notes (Signed)
Subjective:    Patient ID: JAKARRI HOLZ, male    DOB: 01-Aug-1945, 75 y.o.   MRN: [id number]  HPI Here for Medicare wellness visit and follow up of chronic health conditions This visit occurred during the SARS-CoV-2 public health emergency.  Safety protocols were in place, including screening questions prior to the visit, additional usage of staff PPE, and extensive cleaning of exam room while observing appropriate contact time as indicated for disinfecting solutions.   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 Discussed Vision and hearing are okay Fell once--getting into son's truck. No injury No depression or anhedonia Still independent with instrumental ADLs Walks with cane when out---walker in home at times No memory problems  Notes higher BP in right arm than left  May be 170's systolic at first---then down in 120's after rest Continues on medications No chest pain or SOB No dizziness or syncope Slight edema in left ankle  Diagnosed with hemochromatosis Is on the desferoxamine now Monitored closely  Regular monitoring of carotids--no change recently Ongoing leg claudication--resolves with rest  Uses cimetidine only prn Hasn't needed lately No dysphagia  Current Outpatient Medications on File Prior to Visit  Medication Sig Dispense Refill   Acetaminophen (TYLENOL) 325 MG CAPS Tylenol 325 mg capsule  Take 1 capsule 3 times a day by oral route.     aspirin 81 MG tablet Take 81 mg by mouth daily.     chlorthalidone (HYGROTON) 25 MG tablet TAKE 1/2 TABLET(12.5 MG) BY MOUTH DAILY 45 tablet 3   cimetidine (TAGAMET) 400 MG tablet Take 400 mg by mouth 2 (two) times daily as needed (indigestion).      clopidogrel (PLAVIX) 75 MG tablet TAKE 1 TABLET(75 MG) BY MOUTH DAILY WITH BREAKFAST 30 tablet 9   Cyanocobalamin (VITAMIN B-12) 500 MCG SUBL Place 500 mcg under the tongue daily.     Deferasirox  180 MG TABS TAKE 4 TABLETS BY MOUTH DAILY 120 tablet 0   losartan (COZAAR) 25 MG tablet TAKE ONE TABLET BY MOUTH EVERY DAY 90 tablet 3   metoprolol succinate (TOPROL-XL) 100 MG 24 hr tablet TAKE 1 TABLET(100 MG) BY MOUTH DAILY WITH OR IMMEDIATELY FOLLOWING A MEAL 90 tablet 3   nitroGLYCERIN (NITROSTAT) 0.4 MG SL tablet Take by mouth.     Omega-3 Fatty Acids (FISH OIL PO) Take 1,000 mg by mouth daily.     pravastatin (PRAVACHOL) 40 MG tablet TAKE 1 TABLET(40 MG) BY MOUTH DAILY 90 tablet 3   Probiotic Product (PROBIOTIC PO) Take by mouth.     traMADol (ULTRAM) 50 MG tablet TAKE 1 TABLET BY MOUTH EVERY 8 HOURS AS NEEDED 90 tablet 0   Vitamin D, Ergocalciferol, (DRISDOL) 1.25 MG (50000 UNIT) CAPS capsule TAKE 1 CAPSULE BY MOUTH 1 TIME A WEEK 12 capsule 2   No current facility-administered medications on file prior to visit.    Allergies  Allergen Reactions   Diltiazem Hcl Hives and Rash   Cardizem [Diltiazem Hcl]     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   Cerebrovascular disease    Moderate ASVD without focal stenosis in 01/2007   Colon polyps 3/12   single 2mm polyp--tubular adenoma   Diverticulosis 2012   found on colonoscopy   Erectile dysfunction    Family history of adverse reaction to anesthesia  difficult for son & sistor to wake    GERD (gastroesophageal reflux disease)    Hematochezia 05/24/2010   Hyperlipidemia    Osteoarthritis    knees/hands-Dr Lucey   Overweight(278.02)    Peripheral vascular disease (HCC) 03/11/2010   Moderate SFA stenosis; history of claudication   Pneumonia    Rosacea    Tobacco abuse, in remission    30-40 pack years discontinued in 1993   Vitamin B12 deficiency     Past Surgical History:  Procedure Laterality Date   APPENDECTOMY     COLONOSCOPY W/ POLYPECTOMY  2012   ILIAC VEIN ANGIOPLASTY / STENTING Right 08/09/2015    PERIPHERAL VASCULAR CATHETERIZATION Bilateral 06/21/2015   Procedure: Lower Extremity Angiography;  Surgeon: Runell Gess, MD;  Location: [facility] INVASIVE CV LAB;  Service: Cardiovascular;  Laterality: Bilateral;   PERIPHERAL VASCULAR CATHETERIZATION N/A 06/21/2015   Procedure: Abdominal Aortogram;  Surgeon: Runell Gess, MD;  Location: [facility] INVASIVE CV LAB;  Service: Cardiovascular;  Laterality: N/A;   PERIPHERAL VASCULAR CATHETERIZATION N/A 07/26/2015   Procedure: Lower Extremity Angiography;  Surgeon: Runell Gess, MD;  Location: [facility] INVASIVE CV LAB;  Service: Cardiovascular;  Laterality: N/A;   PERIPHERAL VASCULAR CATHETERIZATION N/A 08/09/2015   Procedure: Lower Extremity Angiography;  Surgeon: Runell Gess, MD;  Location: [facility] INVASIVE CV LAB;  Service: Cardiovascular;  Laterality: N/A;   PERIPHERAL VASCULAR CATHETERIZATION  08/09/2015   Procedure: Peripheral Vascular Intervention;  Surgeon: Runell Gess, MD;  Location: [facility] INVASIVE CV LAB;  Service: Cardiovascular;;  rt ext. iliac atherectomy and stent   PERIPHERAL VASCULAR CATHETERIZATION N/A 09/20/2015   Procedure: Lower Extremity Angiography;  Surgeon: Runell Gess, MD;  Location: [facility] INVASIVE CV LAB;  Service: Cardiovascular;  Laterality: N/A;   PERIPHERAL VASCULAR CATHETERIZATION Right 09/20/2015   Procedure: Peripheral Vascular Intervention;  Surgeon: Runell Gess, MD;  Location: [facility] INVASIVE CV LAB;  Service: Cardiovascular;  Laterality: Right;  SFA   ROTATOR CUFF REPAIR Left 7/15   Dr Sherlean Foot    Family History  Problem Relation Age of Onset   Hypertension Father        And siblings   Heart disease Father        And second-degree relatives   Transient ischemic attack Father    Lung cancer Mother    Diabetes Mother    Brain cancer Sister    Diabetes Brother    Atrial fibrillation Brother    Heart disease Sister    Heart disease Sister    Thyroid disease Sister    Ulcers Son    Diabetes Son    Diabetes Son      Social History   Socioeconomic History   Marital status: Married    Spouse name: Not on file   Number of children: 3   Years of education: Not on file   Highest education level: Not on file  Occupational History   Occupation: Managed supply chain--- retired 2013    Comment: [employer]  Tobacco Use   Smoking status: Former    Packs/day: 1.00    Years: 30.00    Pack years: 30.00    Types: Cigarettes    Quit date: 05/16/1990    Years since quitting: 30.5   Smokeless tobacco: Never   Tobacco comments:    Quit in 1993  Vaping Use   Vaping Use: Never used  Substance and Sexual Activity   Alcohol use: No    Alcohol/week: 0.0 standard drinks   Drug  use: No   Sexual activity: Not on file  Other Topics Concern   Not on file  Social History Narrative   Has living will   Wife is health care POA---alternate is son or daughter   Would accept resuscitation but no prolonged artificial ventilation.   No tube feeds if cognitively unaware   Social Determinants of Health   Financial Resource Strain: Low Risk    Difficulty of Paying Living Expenses: Not hard at all  Food Insecurity: No Food Insecurity   Worried About Programme researcher, broadcasting/film/video in the Last Year: Never true   Barista in the Last Year: Never true  Transportation Needs: No Transportation Needs   Lack of Transportation (Medical): No   Lack of Transportation (Non-Medical): No  Physical Activity: Inactive   Days of Exercise per Week: 0 days   Minutes of Exercise per Session: 0 min  Stress: No Stress Concern Present   Feeling of Stress : Not at all  Social Connections: Moderately Integrated   Frequency of Communication with Friends and Family: More than three times a week   Frequency of Social Gatherings with Friends and Family: Never   Attends Religious Services: More than 4 times per year   Active Member of [employer] or Organizations: No   Attends Engineer, structural: Never   Marital  Status: Married  Catering manager Violence: Not At Risk   Fear of Current or Ex-Partner: No   Emotionally Abused: No   Physically Abused: No   Sexually Abused: No   Review of Systems Appetite is good Weight down a few pounds Sleeps well--in general Wears seat belt Teeth are fine---keeps up with dentist No suspicious skin lesions now Bowels move regular--no blood Voids fine. Stream is good. No regular nocturia Does have back pain---OA/disc. No Rx    Objective:   Physical Exam Constitutional:      Appearance: Normal appearance.  HENT:     Mouth/Throat:     Comments: No lesions Eyes:     Conjunctiva/sclera: Conjunctivae normal.     Pupils: Pupils are equal, round, and reactive to light.  Cardiovascular:     Rate and Rhythm: Normal rate and regular rhythm.     Heart sounds: No murmur heard.   No gallop.     Comments: Feet warm without palpable pulses Pulmonary:     Effort: Pulmonary effort is normal.     Breath sounds: Normal breath sounds. No wheezing or rales.  Abdominal:     Palpations: Abdomen is soft.     Tenderness: There is no abdominal tenderness.  Musculoskeletal:     Cervical back: Neck supple.  Lymphadenopathy:     Cervical: No cervical adenopathy.  Skin:    General: Skin is warm.     Findings: No rash.  Neurological:     Mental Status: He is alert and oriented to person, place, and time.     Comments: Aleene Davidson" [id number] Recall 3/3  Psychiatric:        Mood and Affect: Mood normal.        Behavior: Behavior normal.           Assessment & Plan:

## 2020-12-10 NOTE — Assessment & Plan Note (Signed)
See social history 

## 2020-12-28 ENCOUNTER — Other Ambulatory Visit: Payer: Self-pay | Admitting: Internal Medicine

## 2021-01-03 ENCOUNTER — Other Ambulatory Visit (HOSPITAL_COMMUNITY): Payer: Self-pay

## 2021-01-03 ENCOUNTER — Other Ambulatory Visit (HOSPITAL_COMMUNITY): Payer: Self-pay | Admitting: Physician Assistant

## 2021-01-03 MED ORDER — DEFERASIROX 180 MG PO TABS
ORAL_TABLET | ORAL | 0 refills | Status: DC
  Filled 2021-01-03: qty 120, 30d supply, fill #0

## 2021-01-03 NOTE — Telephone Encounter (Signed)
Chart reviewed. Deferasirox refilled per last office visit with Dr. Lorenso Courier.

## 2021-01-04 ENCOUNTER — Other Ambulatory Visit: Payer: Self-pay | Admitting: Internal Medicine

## 2021-01-05 NOTE — Telephone Encounter (Signed)
Last OV - 12/28/2020 Next OV - 12/14/2021 Last Filled - 12/06/2020

## 2021-01-06 ENCOUNTER — Other Ambulatory Visit (HOSPITAL_COMMUNITY): Payer: Self-pay

## 2021-01-11 NOTE — Progress Notes (Signed)
[facility] [place],  [address]   CLINIC:  Medical Oncology/Hematology  PCP:  Venia Carbon, MD [place] / [place] [address]  [phone]  REASON FOR VISIT:  Follow-up for hereditary hemochromatosis  PRIOR THERAPY: none  CURRENT THERAPY: Deferasirox 720 mg daily  INTERVAL HISTORY:  [name], a 75 y.o. 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 [name] was last seen on 08/25/2020. His last colonoscopy was on 02/17/2019.  Today he reports feeling good. He denies hematochezia, hematuria, abdominal pain, and black stools. He takes vitamin D once a week, and he takes vitamin B-12 daily. He reports occasional diarrhea which is controlled well with Imodium.   REVIEW OF SYSTEMS:  Review of Systems  Constitutional:  Negative for appetite change and fatigue (75%).  Gastrointestinal:  Positive for diarrhea. Negative for abdominal pain and blood in stool.  Genitourinary:  Negative for hematuria.   All other systems reviewed and are negative.  PAST MEDICAL/SURGICAL HISTORY:  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   Cerebrovascular disease    Moderate ASVD without focal stenosis in 01/2007   Colon polyps 3/12   single 44m polyp--tubular adenoma   Diverticulosis 2012   found on colonoscopy   Erectile dysfunction    Family history of adverse reaction to anesthesia    difficult for son & sistor to wake    GERD (gastroesophageal reflux disease)    Hematochezia 05/24/2010   Hyperlipidemia    Osteoarthritis    knees/hands-Dr Lucey   Overweight(278.02)    Peripheral vascular disease ([place] 03/11/2010   Moderate SFA stenosis; history of claudication   Pneumonia    Rosacea    Tobacco abuse, in remission    30-40 pack years discontinued in 1993   Vitamin B12 deficiency    Past  Surgical History:  Procedure Laterality Date   APPENDECTOMY     COLONOSCOPY W/ POLYPECTOMY  2012   [place]/ ST[place] Right 08/09/2015   PERIPHERAL VASCULAR CATHETERIZATION Bilateral 06/21/2015   Procedure: Lower Extremity Angiography;  Surgeon: JLorretta Harp MD;  [facility];  Service: Cardiovascular;  Laterality: Bilateral;   PERIPHERAL VASCULAR CATHETERIZATION N/A 06/21/2015   Procedure: Abdominal Aortogram;  Surgeon: JLorretta Harp MD;  [facility];  Service: Cardiovascular;  Laterality: N/A;   PERIPHERAL VASCULAR CATHETERIZATION N/A 07/26/2015   Procedure: Lower Extremity Angiography;  Surgeon: JLorretta Harp MD;  [facility];  Service: Cardiovascular;  Laterality: N/A;   PERIPHERAL VASCULAR CATHETERIZATION N/A 08/09/2015   Procedure: Lower Extremity Angiography;  Surgeon: JLorretta Harp MD;  [facility];  Service: Cardiovascular;  Laterality: N/A;   PERIPHERAL VASCULAR CATHETERIZATION  08/09/2015   Procedure: Peripheral Vascular Intervention;  Surgeon: JLorretta Harp MD;  [facility];  Service: Cardiovascular;;  rt ext. iliac atherectomy and stent   PERIPHERAL VASCULAR CATHETERIZATION N/A 09/20/2015   Procedure: Lower Extremity Angiography;  Surgeon: JLorretta Harp MD;  [facility];  Service: Cardiovascular;  Laterality: N/A;   PERIPHERAL VASCULAR CATHETERIZATION Right 09/20/2015   Procedure: Peripheral Vascular Intervention;  Surgeon: JLorretta Harp MD;  [facility];  Service: Cardiovascular;  Laterality: Right;  SFA   ROTATOR CUFF REPAIR Left 7/15   Dr LRonnie Derby  SOCIAL HISTORY:  Social History   Socioeconomic History   Marital status: Married    Spouse name: Not on file   Number of children: 3   Years of education: Not on file   Highest education level: Not on file  Occupational History   Occupation: Managed supply chain--- retired  2013    Comment: [employer]  Tobacco Use   Smoking status: Former    Packs/day: 1.00    Years: 30.00    Pack years: 30.00    Types: Cigarettes    Quit date: 05/16/1990    Years since quitting: 30.6   Smokeless tobacco: Never   Tobacco comments:    Quit in 1993  Vaping Use   Vaping Use: Never used  Substance and Sexual Activity   Alcohol use: No    Alcohol/week: 0.0 standard drinks   Drug use: No   Sexual activity: Not on file  Other Topics Concern   Not on file  Social History Narrative   Has living will   Wife is health care POA---alternate is son or daughter   Would accept resuscitation but no prolonged artificial ventilation.   No tube feeds if cognitively unaware   Social Determinants of Health   Financial Resource Strain: Low Risk    Difficulty of Paying Living Expenses: Not hard at all  Food Insecurity: No Food Insecurity   Worried About Charity fundraiser in the Last Year: Never true   Arboriculturist in the Last Year: Never true  Transportation Needs: No Transportation Needs   Lack of Transportation (Medical): No   Lack of Transportation (Non-Medical): No  Physical Activity: Inactive   Days of Exercise per Week: 0 days   Minutes of Exercise per Session: 0 min  Stress: No Stress Concern Present   Feeling of Stress : Not at all  Social Connections: Moderately Integrated   Frequency of Communication with Friends and Family: More than three times a week   Frequency of Social Gatherings with Friends and Family: Never   Attends Religious Services: More than 4 times per year   Active Member of [employer] or Organizations: No   Attends Music therapist: Never   Marital Status: Married  Human resources officer Violence: Not At Risk   Fear of Current or Ex-Partner: No   Emotionally Abused: No   Physically Abused: No   Sexually Abused: No    FAMILY HISTORY:  Family History  Problem Relation Age of Onset   Hypertension Father        And  siblings   Heart disease Father        And second-degree relatives   Transient ischemic attack Father    Lung cancer Mother    Diabetes Mother    Brain cancer Sister    Diabetes Brother    Atrial fibrillation Brother    Heart disease Sister    Heart disease Sister    Thyroid disease Sister    Ulcers Son    Diabetes Son    Diabetes Son     CURRENT MEDICATIONS:  Current Outpatient Medications  Medication Sig Dispense Refill   Acetaminophen (TYLENOL) 325 MG CAPS Tylenol 325 mg capsule  Take 1 capsule 3 times a day by oral route.     aspirin 81 MG tablet Take 81 mg by mouth daily.     chlorthalidone (HYGROTON) 25 MG tablet TAKE 1/2 TABLET(12.5 MG) BY MOUTH DAILY 45 tablet 3   cimetidine (TAGAMET) 400 MG tablet  Take 400 mg by mouth 2 (two) times daily as needed (indigestion).      clopidogrel (PLAVIX) 75 MG tablet TAKE 1 TABLET(75 MG) BY MOUTH DAILY WITH BREAKFAST 30 tablet 9   Cyanocobalamin (VITAMIN B-12) 500 MCG SUBL Place 500 mcg under the tongue daily.     Deferasirox 180 MG TABS TAKE 4 TABLETS BY MOUTH DAILY 120 tablet 0   losartan (COZAAR) 25 MG tablet TAKE ONE TABLET BY MOUTH EVERY DAY 90 tablet 3   metoprolol succinate (TOPROL-XL) 100 MG 24 hr tablet TAKE 1 TABLET(100 MG) BY MOUTH DAILY WITH OR IMMEDIATELY FOLLOWING A MEAL 90 tablet 3   nitroGLYCERIN (NITROSTAT) 0.4 MG SL tablet Take by mouth.     Omega-3 Fatty Acids (FISH OIL PO) Take 1,000 mg by mouth daily.     pravastatin (PRAVACHOL) 40 MG tablet TAKE 1 TABLET(40 MG) BY MOUTH DAILY 90 tablet 3   Probiotic Product (PROBIOTIC PO) Take by mouth.     traMADol (ULTRAM) 50 MG tablet TAKE 1 TABLET BY MOUTH EVERY 8 HOURS AS NEEDED 90 tablet 0   Vitamin D, Ergocalciferol, (DRISDOL) 1.25 MG (50000 UNIT) CAPS capsule TAKE 1 CAPSULE BY MOUTH 1 TIME A WEEK 12 capsule 2   No current facility-administered medications for this visit.    ALLERGIES:  Allergies  Allergen Reactions   Diltiazem Hcl Hives and Rash   Cardizem [Diltiazem  Hcl]     PHYSICAL EXAM:  Performance status (ECOG): 0 - Asymptomatic  There were no vitals filed for this visit. Wt Readings from Last 3 Encounters:  12/10/20 208 lb (94.3 kg)  11/09/20 211 lb 9.6 oz (96 kg)  08/25/20 212 lb 3.2 oz (96.3 kg)   Physical Exam Vitals reviewed.  Constitutional:      Appearance: Normal appearance.  Cardiovascular:     Rate and Rhythm: Normal rate and regular rhythm.     Pulses: Normal pulses.     Heart sounds: Normal heart sounds.  Pulmonary:     Effort: Pulmonary effort is normal.     Breath sounds: Normal breath sounds.  Abdominal:     Palpations: Abdomen is soft. There is no hepatomegaly, splenomegaly or mass.     Tenderness: There is no abdominal tenderness.  Neurological:     General: No focal deficit present.     Mental Status: He is alert and oriented to person, place, and time.  Psychiatric:        Mood and Affect: Mood normal.        Behavior: Behavior normal.    LABORATORY DATA:  I have reviewed the labs as listed.  CBC Latest Ref Rng & Units 11/09/2020 08/25/2020 06/16/2020  WBC 4.0 - 10.5 K/uL 8.3 8.2 10.6(H)  Hemoglobin 13.0 - 17.0 g/dL 10.0(L) 10.3(L) 10.1(L)  Hematocrit 39.0 - 52.0 % 30.0(L) 31.0(L) 31.0(L)  Platelets 150 - 400 K/uL 299 321 330   CMP Latest Ref Rng & Units 11/09/2020 08/25/2020 06/16/2020  Glucose 70 - 99 mg/dL 110(H) 96 100(H)  BUN 8 - 23 mg/dL 22 20 28(H)  Creatinine 0.61 - 1.24 mg/dL 1.03 1.02 1.04  Sodium 135 - 145 mmol/L 137 136 134(L)  Potassium 3.5 - 5.1 mmol/L 3.6 3.7 3.8  Chloride 98 - 111 mmol/L 105 104 105  CO2 22 - 32 mmol/L '24 23 22  '$ Calcium 8.9 - 10.3 mg/dL 8.9 9.1 8.8(L)  Total Protein 6.5 - 8.1 g/dL 7.3 7.4 7.1  Total Bilirubin 0.3 - 1.2 mg/dL 0.5 0.6 0.6  Alkaline Phos  38 - 126 U/L 36(L) 36(L) 32(L)  AST 15 - 41 U/L '16 19 23  '$ ALT 0 - 44 U/L 15 19 37      Component Value Date/Time   RBC 2.56 (L) 11/09/2020 1321   MCV 117.2 (H) 11/09/2020 1321   MCH 39.1 (H) 11/09/2020 1321   MCHC 33.3  11/09/2020 1321   RDW 14.8 11/09/2020 1321   LYMPHSABS 2.7 11/09/2020 1321   MONOABS 1.0 11/09/2020 1321   EOSABS 0.2 11/09/2020 1321   BASOSABS 0.1 11/09/2020 1321    DIAGNOSTIC IMAGING:  I have independently reviewed the scans and discussed with the patient. No results found.   ASSESSMENT:  1.  Hereditary hemochromatosis: -Patient seen for elevated ferritin levels. -EMR evaluation shows hemochromatosis testing on 01/27/2019 which showed heterozygosity for C282Y and H63D variants. -Given the elevated ferritin levels above 1000 and the results of genetic testing, this is compatible with hereditary hemochromatosis. -Patient does have arthritis of the small joints of the hands and arthritis of the back. -No history of CHF but has CAD.  No diabetes. -MRI of the liver on 12/11/2019 shows estimated liver iron concentration approximately 7.7 mg/g compatible with moderate liver iron.  Estimated fat fraction ranges from 7-12%, more pronounced in the right lobe compared to left hepatic lobe.  No focal hepatic lesions seen.  No morphological changes of significant liver disease. -Jadenu 540 mg daily started on 12/24/2019, dose increased to 720 mg daily on 02/09/2020.   2.  Macrocytic anemia: -Colonoscopy by Dr. Loletha Carrow on 02/17/2019 shows diverticulosis in the left colon, 12 mm polyp in the cecum, 4 to 6 mm polyp in the ascending colon and 5 mm polyp in the descending colon.  Pathology was consistent with tubular adenomas.   PLAN:  1.  Hereditary hemochromatosis: -He is taking Jadenu 720 mg in the mornings on an empty stomach. - He tolerates it well and denies any abdominal pains. - Last ferritin was 387, improved from 473.  Percent saturation was 73. - CBC today shows hemoglobin 9.7 with normal white count and platelet count.  LFTs are within normal limits. - Continue current dose of Jadenu.  We will follow-up on ferritin and iron panel from today.  RTC 2 months for follow-up.   2.  Macrocytic  anemia: -His hemoglobin is slightly worse at 9.7 with MCV 117.  Denies any bleeding per rectum or melena.  Last colonoscopy was 2 years ago. - He is taking vitamin B12 sublingual tablet daily.  We will check vitamin B-12, methylmalonic acid, folic acid, copper, LDH and reticulocyte count. - If the hemoglobin continues to go down, will consider bone marrow aspiration and biopsy.   3.  Vitamin D deficiency: -Continue vitamin D 50,000 units weekly.  Last vitamin D was 73.  Orders placed this encounter:  No orders of the defined types were placed in this encounter.    Derek Jack, MD [place] [phone]   I, Thana Ates, am acting as a scribe for Dr. Derek Jack.  I, Derek Jack MD, have reviewed the above documentation for accuracy and completeness, and I agree with the above.

## 2021-01-12 ENCOUNTER — Inpatient Hospital Stay (HOSPITAL_COMMUNITY): Payer: Medicare PPO | Attending: Hematology | Admitting: Hematology

## 2021-01-12 ENCOUNTER — Inpatient Hospital Stay (HOSPITAL_COMMUNITY): Payer: Medicare PPO

## 2021-01-12 ENCOUNTER — Other Ambulatory Visit: Payer: Self-pay

## 2021-01-12 DIAGNOSIS — R7989 Other specified abnormal findings of blood chemistry: Secondary | ICD-10-CM

## 2021-01-12 DIAGNOSIS — E559 Vitamin D deficiency, unspecified: Secondary | ICD-10-CM | POA: Insufficient documentation

## 2021-01-12 DIAGNOSIS — I2510 Atherosclerotic heart disease of native coronary artery without angina pectoris: Secondary | ICD-10-CM | POA: Diagnosis not present

## 2021-01-12 DIAGNOSIS — E538 Deficiency of other specified B group vitamins: Secondary | ICD-10-CM | POA: Diagnosis not present

## 2021-01-12 DIAGNOSIS — M1990 Unspecified osteoarthritis, unspecified site: Secondary | ICD-10-CM | POA: Insufficient documentation

## 2021-01-12 DIAGNOSIS — R197 Diarrhea, unspecified: Secondary | ICD-10-CM | POA: Diagnosis not present

## 2021-01-12 DIAGNOSIS — Z801 Family history of malignant neoplasm of trachea, bronchus and lung: Secondary | ICD-10-CM | POA: Insufficient documentation

## 2021-01-12 DIAGNOSIS — D539 Nutritional anemia, unspecified: Secondary | ICD-10-CM | POA: Diagnosis not present

## 2021-01-12 DIAGNOSIS — Z8719 Personal history of other diseases of the digestive system: Secondary | ICD-10-CM | POA: Insufficient documentation

## 2021-01-12 DIAGNOSIS — E785 Hyperlipidemia, unspecified: Secondary | ICD-10-CM | POA: Diagnosis not present

## 2021-01-12 DIAGNOSIS — Z7982 Long term (current) use of aspirin: Secondary | ICD-10-CM | POA: Insufficient documentation

## 2021-01-12 DIAGNOSIS — K219 Gastro-esophageal reflux disease without esophagitis: Secondary | ICD-10-CM | POA: Diagnosis not present

## 2021-01-12 DIAGNOSIS — Z79899 Other long term (current) drug therapy: Secondary | ICD-10-CM | POA: Insufficient documentation

## 2021-01-12 DIAGNOSIS — I739 Peripheral vascular disease, unspecified: Secondary | ICD-10-CM | POA: Diagnosis not present

## 2021-01-12 LAB — COMPREHENSIVE METABOLIC PANEL
ALT: 15 U/L (ref 0–44)
AST: 16 U/L (ref 15–41)
Albumin: 4 g/dL (ref 3.5–5.0)
Alkaline Phosphatase: 41 U/L (ref 38–126)
Anion gap: 7 (ref 5–15)
BUN: 24 mg/dL — ABNORMAL HIGH (ref 8–23)
CO2: 23 mmol/L (ref 22–32)
Calcium: 8.8 mg/dL — ABNORMAL LOW (ref 8.9–10.3)
Chloride: 105 mmol/L (ref 98–111)
Creatinine, Ser: 1 mg/dL (ref 0.61–1.24)
GFR, Estimated: >60 mL/min (ref >60)
Glucose, Bld: 92 mg/dL (ref 70–99)
Potassium: 3.9 mmol/L (ref 3.5–5.1)
Sodium: 135 mmol/L (ref 135–145)
Total Bilirubin: 0.5 mg/dL (ref 0.3–1.2)
Total Protein: 7.4 g/dL (ref 6.5–8.1)

## 2021-01-12 LAB — FERRITIN: Ferritin: 337 ng/mL — ABNORMAL HIGH (ref 24–336)

## 2021-01-12 LAB — CBC WITH DIFFERENTIAL/PLATELET
Abs Immature Granulocytes: 0.03 10*3/uL (ref 0.00–0.07)
Basophils Absolute: 0.1 10*3/uL (ref 0.0–0.1)
Basophils Relative: 1 %
Eosinophils Absolute: 0.2 10*3/uL (ref 0.0–0.5)
Eosinophils Relative: 3 %
HCT: 29.6 % — ABNORMAL LOW (ref 39.0–52.0)
Hemoglobin: 9.7 g/dL — ABNORMAL LOW (ref 13.0–17.0)
Immature Granulocytes: 0 %
Lymphocytes Relative: 29 %
Lymphs Abs: 2.4 10*3/uL (ref 0.7–4.0)
MCH: 38.5 pg — ABNORMAL HIGH (ref 26.0–34.0)
MCHC: 32.8 g/dL (ref 30.0–36.0)
MCV: 117.5 fL — ABNORMAL HIGH (ref 80.0–100.0)
Monocytes Absolute: 1 10*3/uL (ref 0.1–1.0)
Monocytes Relative: 12 %
Neutro Abs: 4.6 10*3/uL (ref 1.7–7.7)
Neutrophils Relative %: 55 %
Platelets: 342 10*3/uL (ref 150–400)
RBC: 2.52 MIL/uL — ABNORMAL LOW (ref 4.22–5.81)
RDW: 15.1 % (ref 11.5–15.5)
WBC: 8.3 10*3/uL (ref 4.0–10.5)
nRBC: 0.5 % — ABNORMAL HIGH (ref 0.0–0.2)

## 2021-01-12 LAB — FOLATE: Folate: 8.2 ng/mL (ref >5.9)

## 2021-01-12 LAB — RETICULOCYTES
Immature Retic Fract: 24 % — ABNORMAL HIGH (ref 2.3–15.9)
RBC.: 2.47 MIL/uL — ABNORMAL LOW (ref 4.22–5.81)
Retic Count, Absolute: 47.7 10*3/uL (ref 19.0–186.0)
Retic Ct Pct: 1.9 % (ref 0.4–3.1)

## 2021-01-12 LAB — IRON AND TIBC
Iron: 151 ug/dL (ref 45–182)
Saturation Ratios: 59 % — ABNORMAL HIGH (ref 17.9–39.5)
TIBC: 256 ug/dL (ref 250–450)
UIBC: 105 ug/dL

## 2021-01-12 LAB — VITAMIN B12: Vitamin B-12: 662 pg/mL (ref 180–914)

## 2021-01-12 LAB — LACTATE DEHYDROGENASE: LDH: 131 U/L (ref 98–192)

## 2021-01-12 LAB — VITAMIN D 25 HYDROXY (VIT D DEFICIENCY, FRACTURES): Vit D, 25-Hydroxy: 64.38 ng/mL (ref 30–100)

## 2021-01-12 NOTE — Patient Instructions (Addendum)
[facility] at [facility] Discharge Instructions  You were seen today by Dr. Katragadda. He went over your recent results. Dr. Katragadda will see you back in 2 months for labs and follow up.   Thank you for choosing [facility] at [facility] to provide your oncology and hematology care.  To afford each patient quality time with our provider, please arrive at least 15 minutes before your scheduled appointment time.   If you have a lab appointment with the Cancer Center please come in thru the Main Entrance and check in at the main information desk  You need to re-schedule your appointment should you arrive 10 or more minutes late.  We strive to give you quality time with our providers, and arriving late affects you and other patients whose appointments are after yours.  Also, if you no show three or more times for appointments you may be dismissed from the clinic at the providers discretion.     Again, thank you for choosing [facility].  Our hope is that these requests will decrease the amount of time that you wait before being seen by our physicians.       _____________________________________________________________  Should you have questions after your visit to  Cancer Center, please contact our office at [phone] between the hours of 8:00 a.m. and 4:30 p.m.  Voicemails left after 4:00 p.m. will not be returned until the following business day.  For prescription refill requests, have your pharmacy contact our office and allow 72 hours.    Cancer Center Support Programs:   > Cancer Support Group  2nd Tuesday of the month 1pm-2pm, Journey Room   

## 2021-01-13 LAB — COPPER, SERUM: Copper: 86 ug/dL (ref 69–132)

## 2021-01-14 LAB — METHYLMALONIC ACID, SERUM: Methylmalonic Acid, Quantitative: 189 nmol/L (ref 0–378)

## 2021-01-22 ENCOUNTER — Other Ambulatory Visit: Payer: Self-pay | Admitting: Internal Medicine

## 2021-02-01 ENCOUNTER — Other Ambulatory Visit (HOSPITAL_COMMUNITY): Payer: Self-pay | Admitting: Hematology

## 2021-02-01 ENCOUNTER — Other Ambulatory Visit (HOSPITAL_COMMUNITY): Payer: Self-pay

## 2021-02-01 ENCOUNTER — Other Ambulatory Visit: Payer: Self-pay | Admitting: Internal Medicine

## 2021-02-01 MED ORDER — DEFERASIROX 180 MG PO TABS
ORAL_TABLET | ORAL | 2 refills | Status: DC
  Filled 2021-02-01: qty 120, 30d supply, fill #0
  Filled 2021-03-01: qty 120, 30d supply, fill #1
  Filled 2021-03-29: qty 120, 30d supply, fill #2

## 2021-02-01 NOTE — Telephone Encounter (Signed)
Last filled 01-05-21 #90 Last OV 12-10-20 Next OV 12-14-21 [facility]

## 2021-02-03 ENCOUNTER — Other Ambulatory Visit (HOSPITAL_COMMUNITY): Payer: Self-pay

## 2021-03-01 ENCOUNTER — Other Ambulatory Visit (HOSPITAL_COMMUNITY): Payer: Self-pay

## 2021-03-04 ENCOUNTER — Other Ambulatory Visit (HOSPITAL_COMMUNITY): Payer: Self-pay

## 2021-03-04 ENCOUNTER — Other Ambulatory Visit: Payer: Self-pay | Admitting: Internal Medicine

## 2021-03-04 NOTE — Telephone Encounter (Signed)
Last filled 02-01-21 #90 Last OV 12-10-20 Next OV 12-14-21 [facility]

## 2021-03-14 ENCOUNTER — Other Ambulatory Visit: Payer: Self-pay | Admitting: Cardiovascular Disease

## 2021-03-15 ENCOUNTER — Other Ambulatory Visit (HOSPITAL_COMMUNITY): Payer: Self-pay

## 2021-03-15 DIAGNOSIS — R7989 Other specified abnormal findings of blood chemistry: Secondary | ICD-10-CM

## 2021-03-15 DIAGNOSIS — E559 Vitamin D deficiency, unspecified: Secondary | ICD-10-CM

## 2021-03-15 NOTE — Progress Notes (Signed)
Aaron Sexton, [place] [id number]   CLINIC:  Medical Oncology/Hematology  PCP:  Aaron Carbon, MD [place] / [place] [address]  [phone]  REASON FOR VISIT:  Follow-up for hereditary hemochromatosis  PRIOR THERAPY: none  CURRENT THERAPY: Deferasirox 720 mg daily  INTERVAL HISTORY:  [name], a 75 y.o. 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 Aaron Sexton was last seen on 01/12/2021.  Today he reports feeling good. He denies any current bleeding, black stools, and increased fatigue.   REVIEW OF SYSTEMS:  Review of Systems  Constitutional:  Negative for appetite change (75%) and fatigue (75%).  HENT:   Negative for nosebleeds.   Respiratory:  Negative for hemoptysis.   Gastrointestinal:  Negative for blood in stool.  Genitourinary:  Negative for hematuria.   Musculoskeletal:  Positive for arthralgias (5/10 bilateral leg).  Neurological:  Positive for numbness.  All other systems reviewed and are negative.  PAST MEDICAL/SURGICAL HISTORY:  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   Cerebrovascular disease    Moderate ASVD without focal stenosis in 01/2007   Colon polyps 3/12   single 9m polyp--tubular adenoma   Diverticulosis 2012   found on colonoscopy   Erectile dysfunction    Family history of adverse reaction to anesthesia    difficult for son & sistor to wake    GERD (gastroesophageal reflux disease)    Hematochezia 05/24/2010   Hyperlipidemia    Osteoarthritis    knees/hands-Dr Lucey   Overweight(278.02)    Peripheral vascular disease ([place] 03/11/2010   Moderate SFA stenosis; history of claudication   Pneumonia    Rosacea    Tobacco abuse, in remission    30-40 pack years discontinued in 1993   Vitamin B12 deficiency    Past Surgical History:   Procedure Laterality Date   APPENDECTOMY     COLONOSCOPY W/ POLYPECTOMY  2012   [place] STENTING Right 08/09/2015   PERIPHERAL VASCULAR CATHETERIZATION Bilateral 06/21/2015   Procedure: Lower Extremity Angiography;  Surgeon: JLorretta Harp MD;  [facility];  Service: Cardiovascular;  Laterality: Bilateral;   PERIPHERAL VASCULAR CATHETERIZATION N/A 06/21/2015   Procedure: Abdominal Aortogram;  Surgeon: JLorretta Harp MD;  [facility];  Service: Cardiovascular;  Laterality: N/A;   PERIPHERAL VASCULAR CATHETERIZATION N/A 07/26/2015   Procedure: Lower Extremity Angiography;  Surgeon: JLorretta Harp MD;  [facility];  Service: Cardiovascular;  Laterality: N/A;   PERIPHERAL VASCULAR CATHETERIZATION N/A 08/09/2015   Procedure: Lower Extremity Angiography;  Surgeon: JLorretta Harp MD;  [facility];  Service: Cardiovascular;  Laterality: N/A;   PERIPHERAL VASCULAR CATHETERIZATION  08/09/2015   Procedure: Peripheral Vascular Intervention;  Surgeon: JLorretta Harp MD;  [facility];  Service: Cardiovascular;;  rt ext. iliac atherectomy and stent   PERIPHERAL VASCULAR CATHETERIZATION N/A 09/20/2015   Procedure: Lower Extremity Angiography;  Surgeon: JLorretta Harp MD;  [facility];  Service: Cardiovascular;  Laterality: N/A;   PERIPHERAL VASCULAR CATHETERIZATION Right 09/20/2015   Procedure: Peripheral Vascular Intervention;  Surgeon: JLorretta Harp MD;  [facility];  Service: Cardiovascular;  Laterality: Right;  SFA   ROTATOR CUFF REPAIR Left 7/15   Dr LRonnie Derby  SOCIAL HISTORY:  Social History   Socioeconomic History   Marital status: Married    Spouse name: Not on file   Number of children: 3   Years of education: Not on file   Highest education level: Not on file  Occupational History   Occupation: Managed supply chain--- retired 2013    Comment:  [employer]  Tobacco Use   Smoking status: Former    Packs/day: 1.00    Years: 30.00    Pack years: 30.00    Types: Cigarettes    Quit date: 05/16/1990    Years since quitting: 30.8   Smokeless tobacco: Never   Tobacco comments:    Quit in 1993  Vaping Use   Vaping Use: Never used  Substance and Sexual Activity   Alcohol use: No    Alcohol/week: 0.0 standard drinks   Drug use: No   Sexual activity: Not on file  Other Topics Concern   Not on file  Social History Narrative   Has living will   Wife is health care POA---alternate is son or daughter   Would accept resuscitation but no prolonged artificial ventilation.   No tube feeds if cognitively unaware   Social Determinants of Health   Financial Resource Strain: Not on file  Food Insecurity: Not on file  Transportation Needs: Not on file  Physical Activity: Not on file  Stress: Not on file  Social Connections: Not on file  Intimate Partner Violence: Not on file    FAMILY HISTORY:  Family History  Problem Relation Age of Onset   Hypertension Father        And siblings   Heart disease Father        And second-degree relatives   Transient ischemic attack Father    Lung cancer Mother    Diabetes Mother    Brain cancer Sister    Diabetes Brother    Atrial fibrillation Brother    Heart disease Sister    Heart disease Sister    Thyroid disease Sister    Ulcers Son    Diabetes Son    Diabetes Son     CURRENT MEDICATIONS:  Current Outpatient Medications  Medication Sig Dispense Refill   Acetaminophen (TYLENOL) 325 MG CAPS Tylenol 325 mg capsule  Take 1 capsule 3 times a day by oral route.     aspirin 81 MG tablet Take 81 mg by mouth daily.     chlorthalidone (HYGROTON) 25 MG tablet TAKE 1/2 TABLET(12.5 MG) BY MOUTH DAILY 45 tablet 3   cimetidine (TAGAMET) 400 MG tablet Take 400 mg by mouth 2 (two) times daily as needed (indigestion).      clopidogrel (PLAVIX) 75 MG tablet TAKE 1 TABLET(75 MG) BY  MOUTH DAILY WITH BREAKFAST 30 tablet 9   Cyanocobalamin (VITAMIN B-12) 500 MCG SUBL Place 500 mcg under the tongue daily.     Deferasirox 180 MG TABS TAKE 4 TABLETS BY MOUTH DAILY 120 tablet 2   losartan (COZAAR) 25 MG tablet TAKE ONE TABLET BY MOUTH EVERY DAY 90 tablet 3   metoprolol succinate (TOPROL-XL) 100 MG 24 hr tablet TAKE 1 TABLET(100 MG) BY MOUTH DAILY WITH OR IMMEDIATELY FOLLOWING A MEAL 90 tablet 3   Omega-3 Fatty Acids (FISH OIL PO) Take 1,000 mg by mouth daily.     pravastatin (PRAVACHOL) 40 MG tablet TAKE 1 TABLET(40 MG) BY MOUTH DAILY 90 tablet 3   Probiotic Product (PROBIOTIC PO) Take by mouth.     traMADol (ULTRAM) 50  MG tablet TAKE 1 TABLET BY MOUTH EVERY 8 HOURS AS NEEDED 90 tablet 0   Vitamin D, Ergocalciferol, (DRISDOL) 1.25 MG (50000 UNIT) CAPS capsule TAKE 1 CAPSULE BY MOUTH 1 TIME A WEEK 12 capsule 2   nitroGLYCERIN (NITROSTAT) 0.4 MG SL tablet Take by mouth. (Patient not taking: No sig reported)     No current facility-administered medications for this visit.    ALLERGIES:  Allergies  Allergen Reactions   Diltiazem Hcl Hives and Rash   Cardizem [Diltiazem Hcl]     PHYSICAL EXAM:  Performance status (ECOG): 0 - Asymptomatic  Vitals:   03/16/21 1459  BP: (!) 154/65  Pulse: 83  Resp: 20  Temp: 98.3 F (36.8 C)  SpO2: 96%   Wt Readings from Last 3 Encounters:  03/16/21 209 lb 1 oz (94.8 kg)  01/12/21 210 lb 3.2 oz (95.3 kg)  12/10/20 208 lb (94.3 kg)   Physical Exam Vitals reviewed.  Constitutional:      Appearance: Normal appearance.  Cardiovascular:     Rate and Rhythm: Normal rate and regular rhythm.     Pulses: Normal pulses.     Heart sounds: Normal heart sounds.  Pulmonary:     Effort: Pulmonary effort is normal.     Breath sounds: Normal breath sounds.  Neurological:     General: No focal deficit present.     Mental Status: He is alert and oriented to person, place, and time.  Psychiatric:        Mood and Affect: Mood normal.         Behavior: Behavior normal.    LABORATORY DATA:  I have reviewed the labs as listed.  CBC Latest Ref Rng & Units 03/16/2021 01/12/2021 11/09/2020  WBC 4.0 - 10.5 K/uL 9.6 8.3 8.3  Hemoglobin 13.0 - 17.0 g/dL 9.1(L) 9.7(L) 10.0(L)  Hematocrit 39.0 - 52.0 % 27.3(L) 29.6(L) 30.0(L)  Platelets 150 - 400 K/uL 333 342 299   CMP Latest Ref Rng & Units 03/16/2021 01/12/2021 11/09/2020  Glucose 70 - 99 mg/dL 90 92 110(H)  BUN 8 - 23 mg/dL 28(H) 24(H) 22  Creatinine 0.61 - 1.24 mg/dL 1.04 1.00 1.03  Sodium 135 - 145 mmol/L 138 135 137  Potassium 3.5 - 5.1 mmol/L 3.7 3.9 3.6  Chloride 98 - 111 mmol/L 106 105 105  CO2 22 - 32 mmol/L '22 23 24  ' Calcium 8.9 - 10.3 mg/dL 8.8(L) 8.8(L) 8.9  Total Protein 6.5 - 8.1 g/dL 7.2 7.4 7.3  Total Bilirubin 0.3 - 1.2 mg/dL 0.5 0.5 0.5  Alkaline Phos 38 - 126 U/L 37(L) 41 36(L)  AST 15 - 41 U/L '15 16 16  ' ALT 0 - 44 U/L '13 15 15      ' Component Value Date/Time   RBC 2.31 (L) 03/16/2021 1406   MCV 118.2 (H) 03/16/2021 1406   MCH 39.4 (H) 03/16/2021 1406   MCHC 33.3 03/16/2021 1406   RDW 15.9 (H) 03/16/2021 1406   LYMPHSABS 2.6 03/16/2021 1406   MONOABS 0.9 03/16/2021 1406   EOSABS 0.2 03/16/2021 1406   BASOSABS 0.1 03/16/2021 1406    DIAGNOSTIC IMAGING:  I have independently reviewed the scans and discussed with the patient. No results found.   ASSESSMENT:  1.  Hereditary hemochromatosis: -Patient seen for elevated ferritin levels. -EMR evaluation shows hemochromatosis testing on 01/27/2019 which showed heterozygosity for C282Y and H63D variants. -Given the elevated ferritin levels above 1000 and the results of genetic testing, this is compatible with hereditary hemochromatosis. -Patient does  have arthritis of the small joints of the hands and arthritis of the back. -No history of CHF but has CAD.  No diabetes. -MRI of the liver on 12/11/2019 shows estimated liver iron concentration approximately 7.7 mg/g compatible with moderate liver iron.  Estimated fat  fraction ranges from 7-12%, more pronounced in the right lobe compared to left hepatic lobe.  No focal hepatic lesions seen.  No morphological changes of significant liver disease. -Jadenu 540 mg daily started on 12/24/2019, dose increased to 720 mg daily on 02/09/2020.   2.  Macrocytic anemia: -Colonoscopy by Dr. Loletha Carrow on 02/17/2019 shows diverticulosis in the left colon, 12 mm polyp in the cecum, 4 to 6 mm polyp in the ascending colon and 5 mm polyp in the descending colon.  Pathology was consistent with tubular adenomas.   PLAN:  1.  Hereditary hemochromatosis: - He is taking Jadenu 720 mg in the mornings on an empty stomach. - He is tolerating it well and denies any abdominal symptoms. - Reviewed his labs today which shows ferritin improved to 264 from 337 in September.  Percent saturation was 67. - We will continue Jadenu 720 mg daily. - Plan to repeat labs in 2 months.   2.  Macrocytic anemia: - His hemoglobin has been gradually decreasing for the last few months.  Today hemoglobin is 9.1. - At last visit we have checked his R10, folic acid, copper and methylmalonic acid levels which were within normal limits.  He does not have renal insufficiency. - He does not report any acute drop in energy levels.  Denies any bleeding per rectum or melena. - I plan to repeat CBC in 2 months with work-up for nutritional deficiencies, LDH, and SPEP. 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 - If there is any worsening of anemia at next visit, will consider bone marrow biopsy to rule out other causes.  Would also consider dose reduction of Jadenu if no other causes found.   3.  Vitamin D deficiency: - He will continue vitamin D supplements.  Orders placed this encounter:  No orders of the defined types were placed in this encounter.    Derek Jack, MD [place] [phone]   I, Thana Ates, am acting as a scribe for Dr. Derek Jack.  I, Derek Jack MD, have reviewed the above documentation for accuracy and completeness, and I agree with the above.

## 2021-03-16 ENCOUNTER — Inpatient Hospital Stay (HOSPITAL_COMMUNITY): Payer: Medicare PPO | Attending: Hematology | Admitting: Hematology

## 2021-03-16 ENCOUNTER — Inpatient Hospital Stay (HOSPITAL_COMMUNITY): Payer: Medicare PPO

## 2021-03-16 ENCOUNTER — Other Ambulatory Visit: Payer: Self-pay

## 2021-03-16 ENCOUNTER — Encounter (HOSPITAL_COMMUNITY): Payer: Self-pay | Admitting: Hematology

## 2021-03-16 DIAGNOSIS — E785 Hyperlipidemia, unspecified: Secondary | ICD-10-CM | POA: Insufficient documentation

## 2021-03-16 DIAGNOSIS — Z86010 Personal history of colonic polyps: Secondary | ICD-10-CM | POA: Insufficient documentation

## 2021-03-16 DIAGNOSIS — M1990 Unspecified osteoarthritis, unspecified site: Secondary | ICD-10-CM | POA: Diagnosis not present

## 2021-03-16 DIAGNOSIS — I2510 Atherosclerotic heart disease of native coronary artery without angina pectoris: Secondary | ICD-10-CM | POA: Diagnosis not present

## 2021-03-16 DIAGNOSIS — K219 Gastro-esophageal reflux disease without esophagitis: Secondary | ICD-10-CM | POA: Diagnosis not present

## 2021-03-16 DIAGNOSIS — Z801 Family history of malignant neoplasm of trachea, bronchus and lung: Secondary | ICD-10-CM | POA: Insufficient documentation

## 2021-03-16 DIAGNOSIS — I739 Peripheral vascular disease, unspecified: Secondary | ICD-10-CM | POA: Diagnosis not present

## 2021-03-16 DIAGNOSIS — R7989 Other specified abnormal findings of blood chemistry: Secondary | ICD-10-CM

## 2021-03-16 DIAGNOSIS — Z87891 Personal history of nicotine dependence: Secondary | ICD-10-CM | POA: Diagnosis not present

## 2021-03-16 DIAGNOSIS — E559 Vitamin D deficiency, unspecified: Secondary | ICD-10-CM | POA: Insufficient documentation

## 2021-03-16 DIAGNOSIS — D539 Nutritional anemia, unspecified: Secondary | ICD-10-CM | POA: Diagnosis not present

## 2021-03-16 DIAGNOSIS — Z7982 Long term (current) use of aspirin: Secondary | ICD-10-CM | POA: Diagnosis not present

## 2021-03-16 LAB — IRON AND TIBC
Iron: 176 ug/dL (ref 45–182)
Saturation Ratios: 67 % — ABNORMAL HIGH (ref 17.9–39.5)
TIBC: 261 ug/dL (ref 250–450)
UIBC: 85 ug/dL

## 2021-03-16 LAB — CBC WITH DIFFERENTIAL/PLATELET
Abs Immature Granulocytes: 0.03 10*3/uL (ref 0.00–0.07)
Basophils Absolute: 0.1 10*3/uL (ref 0.0–0.1)
Basophils Relative: 1 %
Eosinophils Absolute: 0.2 10*3/uL (ref 0.0–0.5)
Eosinophils Relative: 2 %
HCT: 27.3 % — ABNORMAL LOW (ref 39.0–52.0)
Hemoglobin: 9.1 g/dL — ABNORMAL LOW (ref 13.0–17.0)
Immature Granulocytes: 0 %
Lymphocytes Relative: 27 %
Lymphs Abs: 2.6 10*3/uL (ref 0.7–4.0)
MCH: 39.4 pg — ABNORMAL HIGH (ref 26.0–34.0)
MCHC: 33.3 g/dL (ref 30.0–36.0)
MCV: 118.2 fL — ABNORMAL HIGH (ref 80.0–100.0)
Monocytes Absolute: 0.9 10*3/uL (ref 0.1–1.0)
Monocytes Relative: 10 %
Neutro Abs: 5.7 10*3/uL (ref 1.7–7.7)
Neutrophils Relative %: 60 %
Platelets: 333 10*3/uL (ref 150–400)
RBC: 2.31 MIL/uL — ABNORMAL LOW (ref 4.22–5.81)
RDW: 15.9 % — ABNORMAL HIGH (ref 11.5–15.5)
WBC: 9.6 10*3/uL (ref 4.0–10.5)
nRBC: 0.4 % — ABNORMAL HIGH (ref 0.0–0.2)

## 2021-03-16 LAB — VITAMIN D 25 HYDROXY (VIT D DEFICIENCY, FRACTURES): Vit D, 25-Hydroxy: 60.24 ng/mL (ref 30–100)

## 2021-03-16 LAB — COMPREHENSIVE METABOLIC PANEL
ALT: 13 U/L (ref 0–44)
AST: 15 U/L (ref 15–41)
Albumin: 3.9 g/dL (ref 3.5–5.0)
Alkaline Phosphatase: 37 U/L — ABNORMAL LOW (ref 38–126)
Anion gap: 10 (ref 5–15)
BUN: 28 mg/dL — ABNORMAL HIGH (ref 8–23)
CO2: 22 mmol/L (ref 22–32)
Calcium: 8.8 mg/dL — ABNORMAL LOW (ref 8.9–10.3)
Chloride: 106 mmol/L (ref 98–111)
Creatinine, Ser: 1.04 mg/dL (ref 0.61–1.24)
GFR, Estimated: >60 mL/min (ref >60)
Glucose, Bld: 90 mg/dL (ref 70–99)
Potassium: 3.7 mmol/L (ref 3.5–5.1)
Sodium: 138 mmol/L (ref 135–145)
Total Bilirubin: 0.5 mg/dL (ref 0.3–1.2)
Total Protein: 7.2 g/dL (ref 6.5–8.1)

## 2021-03-16 LAB — FERRITIN: Ferritin: 264 ng/mL (ref 24–336)

## 2021-03-16 NOTE — Progress Notes (Signed)
All labs ordered in this encounter per Dr. Tomie China request.

## 2021-03-16 NOTE — Patient Instructions (Addendum)
[place] at [facility] Discharge Instructions  You were seen and examined today by Dr. Delton Coombes. He reviewed your most recent labs and everything is looking okay except your hemoglobin is 9.1 today. If this continues to come down Dr. Delton Coombes recommends doing a bone marrow biopsy. He will follow up with you in 2 months with labs and make decision then about bone marrow biopsy.    Thank you for choosing [place] at [facility] to provide your oncology and hematology care.  To afford each patient quality time with our provider, please arrive at least 15 minutes before your scheduled appointment time.   If you have a lab appointment with the [facility] please come in thru the Main Entrance and check in at the main information desk.  You need to re-schedule your appointment should you arrive 10 or more minutes late.  We strive to give you quality time with our providers, and arriving late affects you and other patients whose appointments are after yours.  Also, if you no show three or more times for appointments you may be dismissed from the clinic at the providers discretion.     Again, thank you for choosing [facility].  Our hope is that these requests will decrease the amount of time that you wait before being seen by our physicians.       _____________________________________________________________  Should you have questions after your visit to [facility], please contact our office at [phone] and follow the prompts.  Our office hours are 8:00 a.m. and 4:30 p.m. Monday - Friday.  Please note that voicemails left after 4:00 p.m. may not be returned until the following business day.  We are closed weekends and major holidays.  You do have access to a nurse 24-7, just call the main number to the clinic [phone] and do not press any options, hold on the line and a nurse will answer the phone.    For  prescription refill requests, have your pharmacy contact our office and allow 72 hours.    Due to Covid, you will need to wear a mask upon entering the hospital. If you do not have a mask, a mask will be given to you at the Main Entrance upon arrival. For doctor visits, patients may have 1 support person age 77 or older with them. For treatment visits, patients can not have anyone with them due to social distancing guidelines and our immunocompromised population.

## 2021-03-18 ENCOUNTER — Encounter (HOSPITAL_COMMUNITY): Payer: Self-pay

## 2021-03-18 NOTE — Progress Notes (Signed)
Patient aware of Ferritin lab result from 03/16/2021.

## 2021-03-28 ENCOUNTER — Other Ambulatory Visit (HOSPITAL_COMMUNITY): Payer: Self-pay

## 2021-03-29 ENCOUNTER — Other Ambulatory Visit (HOSPITAL_COMMUNITY): Payer: Self-pay

## 2021-04-05 ENCOUNTER — Other Ambulatory Visit: Payer: Self-pay | Admitting: Internal Medicine

## 2021-04-05 NOTE — Telephone Encounter (Signed)
Name of Medication: Tramadol 50 mg Name of Pharmacy: [facility] [address] Last Fill or Written Date and Quantity: # 70 on 03/04/21  Last Office Visit and Type: 12/10/20 medicare wellness Next Office Visit and Type: 12/14/2021 CPX Last Controlled Substance Agreement Date: none seen Last RFX:JOIT seen

## 2021-04-06 ENCOUNTER — Other Ambulatory Visit (HOSPITAL_COMMUNITY): Payer: Self-pay

## 2021-04-20 ENCOUNTER — Other Ambulatory Visit (HOSPITAL_COMMUNITY): Payer: Self-pay | Admitting: Hematology

## 2021-04-20 DIAGNOSIS — E559 Vitamin D deficiency, unspecified: Secondary | ICD-10-CM

## 2021-04-28 ENCOUNTER — Other Ambulatory Visit (HOSPITAL_COMMUNITY): Payer: Self-pay | Admitting: Hematology

## 2021-04-28 ENCOUNTER — Other Ambulatory Visit (HOSPITAL_COMMUNITY): Payer: Self-pay

## 2021-04-28 MED ORDER — DEFERASIROX 180 MG PO TABS
ORAL_TABLET | ORAL | 2 refills | Status: DC
  Filled 2021-05-05: qty 120, 30d supply, fill #0
  Filled 2021-05-31: qty 120, 30d supply, fill #1
  Filled 2021-06-24: qty 120, 30d supply, fill #2

## 2021-04-29 ENCOUNTER — Other Ambulatory Visit (HOSPITAL_COMMUNITY): Payer: Self-pay

## 2021-05-05 ENCOUNTER — Other Ambulatory Visit: Payer: Self-pay | Admitting: Internal Medicine

## 2021-05-05 ENCOUNTER — Other Ambulatory Visit (HOSPITAL_COMMUNITY): Payer: Self-pay

## 2021-05-05 NOTE — Telephone Encounter (Signed)
Last filled 04-05-21 #90 Last OV 12-10-20 Next OV 12-14-21 [facility]

## 2021-05-06 ENCOUNTER — Other Ambulatory Visit (HOSPITAL_COMMUNITY): Payer: Self-pay

## 2021-05-16 ENCOUNTER — Telehealth: Payer: Self-pay | Admitting: Cardiology

## 2021-05-16 NOTE — Telephone Encounter (Signed)
° ° °*  STAT* If patient is at the pharmacy, call can be transferred to refill team.   1. Which medications need to be refilled? (please list name of each medication and dose if known) chlorthalidone (HYGROTON) 25 MG tablet  2. Which pharmacy/location (including street and city if local pharmacy) is medication to be sent to? [facility] [place], [place]  3. Do they need a 30 day or 90 day supply? 90 days  Pt is almost out of meds and needs refill today

## 2021-05-17 MED ORDER — CHLORTHALIDONE 25 MG PO TABS: ORAL_TABLET | ORAL | 0 refills | Status: DC

## 2021-05-17 NOTE — Telephone Encounter (Signed)
Patient last seen 2 years ago by Dr Harl Bowie, needs appointment, 30 day supply given per protocol

## 2021-05-18 ENCOUNTER — Other Ambulatory Visit: Payer: Self-pay

## 2021-05-18 ENCOUNTER — Encounter (HOSPITAL_COMMUNITY): Payer: Self-pay | Admitting: Hematology

## 2021-05-18 ENCOUNTER — Inpatient Hospital Stay (HOSPITAL_COMMUNITY): Payer: Medicare PPO | Attending: Hematology | Admitting: Hematology

## 2021-05-18 ENCOUNTER — Inpatient Hospital Stay (HOSPITAL_COMMUNITY): Payer: Medicare PPO

## 2021-05-18 DIAGNOSIS — I2510 Atherosclerotic heart disease of native coronary artery without angina pectoris: Secondary | ICD-10-CM | POA: Insufficient documentation

## 2021-05-18 DIAGNOSIS — E538 Deficiency of other specified B group vitamins: Secondary | ICD-10-CM | POA: Insufficient documentation

## 2021-05-18 DIAGNOSIS — M1990 Unspecified osteoarthritis, unspecified site: Secondary | ICD-10-CM | POA: Diagnosis not present

## 2021-05-18 DIAGNOSIS — K5730 Diverticulosis of large intestine without perforation or abscess without bleeding: Secondary | ICD-10-CM | POA: Insufficient documentation

## 2021-05-18 DIAGNOSIS — E559 Vitamin D deficiency, unspecified: Secondary | ICD-10-CM | POA: Diagnosis not present

## 2021-05-18 DIAGNOSIS — Z79899 Other long term (current) drug therapy: Secondary | ICD-10-CM | POA: Insufficient documentation

## 2021-05-18 DIAGNOSIS — K219 Gastro-esophageal reflux disease without esophagitis: Secondary | ICD-10-CM | POA: Diagnosis not present

## 2021-05-18 DIAGNOSIS — Z8719 Personal history of other diseases of the digestive system: Secondary | ICD-10-CM | POA: Diagnosis not present

## 2021-05-18 DIAGNOSIS — M25473 Effusion, unspecified ankle: Secondary | ICD-10-CM | POA: Diagnosis not present

## 2021-05-18 DIAGNOSIS — I739 Peripheral vascular disease, unspecified: Secondary | ICD-10-CM | POA: Diagnosis not present

## 2021-05-18 DIAGNOSIS — F17210 Nicotine dependence, cigarettes, uncomplicated: Secondary | ICD-10-CM | POA: Insufficient documentation

## 2021-05-18 DIAGNOSIS — Z808 Family history of malignant neoplasm of other organs or systems: Secondary | ICD-10-CM | POA: Insufficient documentation

## 2021-05-18 DIAGNOSIS — Z7982 Long term (current) use of aspirin: Secondary | ICD-10-CM | POA: Insufficient documentation

## 2021-05-18 DIAGNOSIS — E785 Hyperlipidemia, unspecified: Secondary | ICD-10-CM | POA: Insufficient documentation

## 2021-05-18 DIAGNOSIS — D539 Nutritional anemia, unspecified: Secondary | ICD-10-CM | POA: Diagnosis not present

## 2021-05-18 LAB — CBC WITH DIFFERENTIAL/PLATELET
Abs Immature Granulocytes: 0.03 10*3/uL (ref 0.00–0.07)
Basophils Absolute: 0.1 10*3/uL (ref 0.0–0.1)
Basophils Relative: 1 %
Eosinophils Absolute: 0.2 10*3/uL (ref 0.0–0.5)
Eosinophils Relative: 3 %
HCT: 28.2 % — ABNORMAL LOW (ref 39.0–52.0)
Hemoglobin: 9.5 g/dL — ABNORMAL LOW (ref 13.0–17.0)
Immature Granulocytes: 0 %
Lymphocytes Relative: 30 %
Lymphs Abs: 2.5 10*3/uL (ref 0.7–4.0)
MCH: 40.4 pg — ABNORMAL HIGH (ref 26.0–34.0)
MCHC: 33.7 g/dL (ref 30.0–36.0)
MCV: 120 fL — ABNORMAL HIGH (ref 80.0–100.0)
Monocytes Absolute: 1 10*3/uL (ref 0.1–1.0)
Monocytes Relative: 13 %
Neutro Abs: 4.3 10*3/uL (ref 1.7–7.7)
Neutrophils Relative %: 53 %
Platelets: 321 10*3/uL (ref 150–400)
RBC: 2.35 MIL/uL — ABNORMAL LOW (ref 4.22–5.81)
RDW: 15.8 % — ABNORMAL HIGH (ref 11.5–15.5)
WBC: 8.1 10*3/uL (ref 4.0–10.5)
nRBC: 0.7 % — ABNORMAL HIGH (ref 0.0–0.2)

## 2021-05-18 LAB — COMPREHENSIVE METABOLIC PANEL
ALT: 15 U/L (ref 0–44)
AST: 16 U/L (ref 15–41)
Albumin: 4.1 g/dL (ref 3.5–5.0)
Alkaline Phosphatase: 40 U/L (ref 38–126)
Anion gap: 8 (ref 5–15)
BUN: 26 mg/dL — ABNORMAL HIGH (ref 8–23)
CO2: 24 mmol/L (ref 22–32)
Calcium: 8.9 mg/dL (ref 8.9–10.3)
Chloride: 103 mmol/L (ref 98–111)
Creatinine, Ser: 1.1 mg/dL (ref 0.61–1.24)
GFR, Estimated: >60 mL/min (ref >60)
Glucose, Bld: 93 mg/dL (ref 70–99)
Potassium: 3.9 mmol/L (ref 3.5–5.1)
Sodium: 135 mmol/L (ref 135–145)
Total Bilirubin: 0.6 mg/dL (ref 0.3–1.2)
Total Protein: 7.6 g/dL (ref 6.5–8.1)

## 2021-05-18 LAB — RETICULOCYTES
Immature Retic Fract: 23.2 % — ABNORMAL HIGH (ref 2.3–15.9)
RBC.: 2.41 MIL/uL — ABNORMAL LOW (ref 4.22–5.81)
Retic Count, Absolute: 45.8 10*3/uL (ref 19.0–186.0)
Retic Ct Pct: 1.9 % (ref 0.4–3.1)

## 2021-05-18 LAB — FOLATE: Folate: 6.9 ng/mL (ref >5.9)

## 2021-05-18 LAB — VITAMIN B12: Vitamin B-12: 735 pg/mL (ref 180–914)

## 2021-05-18 LAB — IRON AND TIBC
Iron: 167 ug/dL (ref 45–182)
Saturation Ratios: 60 % — ABNORMAL HIGH (ref 17.9–39.5)
TIBC: 281 ug/dL (ref 250–450)
UIBC: 114 ug/dL

## 2021-05-18 LAB — LACTATE DEHYDROGENASE: LDH: 129 U/L (ref 98–192)

## 2021-05-18 LAB — FERRITIN: Ferritin: 156 ng/mL (ref 24–336)

## 2021-05-18 NOTE — Patient Instructions (Addendum)
Aaron Sexton at [facility] Discharge Instructions  You were seen and examined today by Dr. Delton Coombes. He reviewed your most recent labs and everything that is back looks good. We will call back with iron levels tomorrow. Continue same dose on deferasirox. Please keep follow up appointment as scheduled in 2 months.    Thank you for choosing [place] at [facility] to provide your oncology and hematology care.  To afford each patient quality time with our provider, please arrive at least 15 minutes before your scheduled appointment time.   If you have a lab appointment with the [facility] please come in thru the Main Entrance and check in at the main information desk.  You need to re-schedule your appointment should you arrive 10 or more minutes late.  We strive to give you quality time with our providers, and arriving late affects you and other patients whose appointments are after yours.  Also, if you no show three or more times for appointments you may be dismissed from the clinic at the providers discretion.     Again, thank you for choosing [facility].  Our hope is that these requests will decrease the amount of time that you wait before being seen by our physicians.       _____________________________________________________________  Should you have questions after your visit to [facility], please contact our office at [phone] and follow the prompts.  Our office hours are 8:00 a.m. and 4:30 p.m. Monday - Friday.  Please note that voicemails left after 4:00 p.m. may not be returned until the following business day.  We are closed weekends and major holidays.  You do have access to a nurse 24-7, just call the main number to the clinic [phone] and do not press any options, hold on the line and a nurse will answer the phone.    For prescription refill requests, have your pharmacy contact our office and allow  72 hours.    Due to Covid, you will need to wear a mask upon entering the hospital. If you do not have a mask, a mask will be given to you at the Main Entrance upon arrival. For doctor visits, patients may have 1 support person age 36 or older with them. For treatment visits, patients can not have anyone with them due to social distancing guidelines and our immunocompromised population.

## 2021-05-18 NOTE — Progress Notes (Signed)
[place], [place] [address]   CLINIC:  Medical Oncology/Hematology  PCP:  Venia Carbon, MD [place] / [place] [address]  [phone]  REASON FOR VISIT:  Follow-up for hereditary hemochromatosis  PRIOR THERAPY: none  CURRENT THERAPY: Deferasirox 720 mg daily  INTERVAL HISTORY:  [name], a 76 y.o. 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 Eisen was last seen on 03/16/2021.  Today he reports feeling good. He denies and vomiting, and he reports stable chronic diarrhea which is helped with imodium. He denies fatigue and light headedness. He reports occasional ankle swellings. He denies CP.   REVIEW OF SYSTEMS:  Review of Systems  Constitutional:  Negative for appetite change and fatigue.  Cardiovascular:  Positive for leg swelling (feet; occasional). Negative for chest pain.  Neurological:  Negative for light-headedness.  Hematological:  Bruises/bleeds easily.  All other systems reviewed and are negative.  PAST MEDICAL/SURGICAL HISTORY:  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   Cerebrovascular disease    Moderate ASVD without focal stenosis in 01/2007   Colon polyps 3/12   single 32mm polyp--tubular adenoma   Diverticulosis 2012   found on colonoscopy   Erectile dysfunction    Family history of adverse reaction to anesthesia    difficult for son & sistor to wake    GERD (gastroesophageal reflux disease)    Hematochezia 05/24/2010   Hyperlipidemia    Osteoarthritis    knees/hands-Dr Lucey   Overweight(278.02)    Peripheral vascular disease ([facility]) 03/11/2010   Moderate SFA stenosis; history of claudication   Pneumonia    Rosacea    Tobacco abuse, in remission    30-40 pack years discontinued in 1993   Vitamin B12 deficiency    Past Surgical History:   Procedure Laterality Date   APPENDECTOMY     COLONOSCOPY W/ POLYPECTOMY  2012   [place] / STENTING Right 08/09/2015   PERIPHERAL VASCULAR CATHETERIZATION Bilateral 06/21/2015   Procedure: Lower Extremity Angiography;  Surgeon: Lorretta Harp, MD;  Location: [facility];  Service: Cardiovascular;  Laterality: Bilateral;   PERIPHERAL VASCULAR CATHETERIZATION N/A 06/21/2015   Procedure: Abdominal Aortogram;  Surgeon: Lorretta Harp, MD;  Location: [facility];  Service: Cardiovascular;  Laterality: N/A;   PERIPHERAL VASCULAR CATHETERIZATION N/A 07/26/2015   Procedure: Lower Extremity Angiography;  Surgeon: Lorretta Harp, MD;  Location: [facility];  Service: Cardiovascular;  Laterality: N/A;   PERIPHERAL VASCULAR CATHETERIZATION N/A 08/09/2015   Procedure: Lower Extremity Angiography;  Surgeon: Lorretta Harp, MD;  Location: [facility];  Service: Cardiovascular;  Laterality: N/A;   PERIPHERAL VASCULAR CATHETERIZATION  08/09/2015   Procedure: Peripheral Vascular Intervention;  Surgeon: Lorretta Harp, MD;  Location: [facility];  Service: Cardiovascular;;  rt ext. iliac atherectomy and stent   PERIPHERAL VASCULAR CATHETERIZATION N/A 09/20/2015   Procedure: Lower Extremity Angiography;  Surgeon: Lorretta Harp, MD;  Location: [facility];  Service: Cardiovascular;  Laterality: N/A;   PERIPHERAL VASCULAR CATHETERIZATION Right 09/20/2015   Procedure: Peripheral Vascular Intervention;  Surgeon: Lorretta Harp, MD;  Location: [facility];  Service: Cardiovascular;  Laterality: Right;  SFA   ROTATOR CUFF REPAIR Left 7/15   Dr Ronnie Derby    SOCIAL HISTORY:  Social  History   Socioeconomic History   Marital status: Married    Spouse name: Not on file   Number of children: 3   Years of education: Not on file   Highest education level: Not on file  Occupational History   Occupation: Managed supply chain--- retired 2013    Comment:  [employer]  Tobacco Use   Smoking status: Former    Packs/day: 1.00    Years: 30.00    Pack years: 30.00    Types: Cigarettes    Quit date: 05/16/1990    Years since quitting: 31.0   Smokeless tobacco: Never   Tobacco comments:    Quit in 1993  Vaping Use   Vaping Use: Never used  Substance and Sexual Activity   Alcohol use: No    Alcohol/week: 0.0 standard drinks   Drug use: No   Sexual activity: Not on file  Other Topics Concern   Not on file  Social History Narrative   Has living will   Wife is health care POA---alternate is son or daughter   Would accept resuscitation but no prolonged artificial ventilation.   No tube feeds if cognitively unaware   Social Determinants of Health   Financial Resource Strain: Not on file  Food Insecurity: Not on file  Transportation Needs: Not on file  Physical Activity: Not on file  Stress: Not on file  Social Connections: Not on file  Intimate Partner Violence: Not on file    FAMILY HISTORY:  Family History  Problem Relation Age of Onset   Hypertension Father        And siblings   Heart disease Father        And second-degree relatives   Transient ischemic attack Father    Lung cancer Mother    Diabetes Mother    Brain cancer Sister    Diabetes Brother    Atrial fibrillation Brother    Heart disease Sister    Heart disease Sister    Thyroid disease Sister    Ulcers Son    Diabetes Son    Diabetes Son     CURRENT MEDICATIONS:  Current Outpatient Medications  Medication Sig Dispense Refill   Acetaminophen (TYLENOL) 325 MG CAPS Tylenol 325 mg capsule  Take 1 capsule 3 times a day by oral route.     aspirin 81 MG tablet Take 81 mg by mouth daily.     chlorthalidone (HYGROTON) 25 MG tablet Take 1/2 tablet (12.5 mg) by mouth daily 15 tablet 0   cimetidine (TAGAMET) 400 MG tablet Take 400 mg by mouth 2 (two) times daily as needed (indigestion).      clopidogrel (PLAVIX) 75 MG tablet TAKE 1 TABLET(75 MG)  BY MOUTH DAILY WITH BREAKFAST 30 tablet 9   Cyanocobalamin (VITAMIN B-12) 500 MCG SUBL Place 500 mcg under the tongue daily.     Deferasirox 180 MG TABS TAKE 4 TABLETS BY MOUTH DAILY 120 tablet 2   losartan (COZAAR) 25 MG tablet TAKE ONE TABLET BY MOUTH EVERY DAY 90 tablet 3   metoprolol succinate (TOPROL-XL) 100 MG 24 hr tablet TAKE 1 TABLET(100 MG) BY MOUTH DAILY WITH OR IMMEDIATELY FOLLOWING A MEAL 90 tablet 3   nitroGLYCERIN (NITROSTAT) 0.4 MG SL tablet Take by mouth.     Omega-3 Fatty Acids (FISH OIL PO) Take 1,000 mg by mouth daily.     pravastatin (PRAVACHOL) 40 MG tablet TAKE 1 TABLET(40 MG) BY MOUTH DAILY 90 tablet 3   Probiotic Product (PROBIOTIC PO)  Take by mouth.     traMADol (ULTRAM) 50 MG tablet TAKE 1 TABLET BY MOUTH EVERY 8 HOURS AS NEEDED 90 tablet 0   Vitamin D, Ergocalciferol, (DRISDOL) 1.25 MG (50000 UNIT) CAPS capsule TAKE 1 CAPSULE BY MOUTH 1 TIME A WEEK 12 capsule 2   No current facility-administered medications for this visit.    ALLERGIES:  Allergies  Allergen Reactions   Diltiazem Hcl Hives and Rash   Cardizem [Diltiazem Hcl]     PHYSICAL EXAM:  Performance status (ECOG): 0 - Asymptomatic  Vitals:   05/18/21 1508  BP: (!) 133/58  Pulse: 84  Resp: 18  Temp: 98.2 F (36.8 C)  SpO2: 98%   Wt Readings from Last 3 Encounters:  05/18/21 210 lb 8 oz (95.5 kg)  03/16/21 209 lb 1 oz (94.8 kg)  01/12/21 210 lb 3.2 oz (95.3 kg)   Physical Exam Vitals reviewed.  Constitutional:      Appearance: Normal appearance. He is obese.  Cardiovascular:     Rate and Rhythm: Normal rate and regular rhythm.     Pulses: Normal pulses.     Heart sounds: Normal heart sounds.  Pulmonary:     Effort: Pulmonary effort is normal.     Breath sounds: Normal breath sounds.  Abdominal:     Palpations: Abdomen is soft. There is no hepatomegaly, splenomegaly or mass.     Tenderness: There is no abdominal tenderness.  Musculoskeletal:     Right lower leg: No edema.     Left  lower leg: No edema.  Neurological:     General: No focal deficit present.     Mental Status: He is alert and oriented to person, place, and time.  Psychiatric:        Mood and Affect: Mood normal.        Behavior: Behavior normal.    LABORATORY DATA:  I have reviewed the labs as listed.  CBC Latest Ref Rng & Units 05/18/2021 03/16/2021 01/12/2021  WBC 4.0 - 10.5 K/uL 8.1 9.6 8.3  Hemoglobin 13.0 - 17.0 g/dL 9.5(L) 9.1(L) 9.7(L)  Hematocrit 39.0 - 52.0 % 28.2(L) 27.3(L) 29.6(L)  Platelets 150 - 400 K/uL 321 333 342   CMP Latest Ref Rng & Units 05/18/2021 03/16/2021 01/12/2021  Glucose 70 - 99 mg/dL 93 90 92  BUN 8 - 23 mg/dL 26(H) 28(H) 24(H)  Creatinine 0.61 - 1.24 mg/dL 1.10 1.04 1.00  Sodium 135 - 145 mmol/L 135 138 135  Potassium 3.5 - 5.1 mmol/L 3.9 3.7 3.9  Chloride 98 - 111 mmol/L 103 106 105  CO2 22 - 32 mmol/L 24 22 23   Calcium 8.9 - 10.3 mg/dL 8.9 8.8(L) 8.8(L)  Total Protein 6.5 - 8.1 g/dL 7.6 7.2 7.4  Total Bilirubin 0.3 - 1.2 mg/dL 0.6 0.5 0.5  Alkaline Phos 38 - 126 U/L 40 37(L) 41  AST 15 - 41 U/L 16 15 16   ALT 0 - 44 U/L 15 13 15       Component Value Date/Time   RBC 2.41 (L) 05/18/2021 1433   RBC 2.35 (L) 05/18/2021 1432   MCV 120.0 (H) 05/18/2021 1432   MCH 40.4 (H) 05/18/2021 1432   MCHC 33.7 05/18/2021 1432   RDW 15.8 (H) 05/18/2021 1432   LYMPHSABS PENDING 05/18/2021 1432   MONOABS PENDING 05/18/2021 1432   EOSABS PENDING 05/18/2021 1432   BASOSABS PENDING 05/18/2021 1432    DIAGNOSTIC IMAGING:  I have independently reviewed the scans and discussed with the patient. No results found.  ASSESSMENT:  1.  Hereditary hemochromatosis: -Patient seen for elevated ferritin levels. -EMR evaluation shows hemochromatosis testing on 01/27/2019 which showed heterozygosity for C282Y and H63D variants. -Given the elevated ferritin levels above 1000 and the results of genetic testing, this is compatible with hereditary hemochromatosis. -Patient does have arthritis of  the small joints of the hands and arthritis of the back. -No history of CHF but has CAD.  No diabetes. -MRI of the liver on 12/11/2019 shows estimated liver iron concentration approximately 7.7 mg/g compatible with moderate liver iron.  Estimated fat fraction ranges from 7-12%, more pronounced in the right lobe compared to left hepatic lobe.  No focal hepatic lesions seen.  No morphological changes of significant liver disease. -Jadenu 540 mg daily started on 12/24/2019, dose increased to 720 mg daily on 02/09/2020.   2.  Macrocytic anemia: -Colonoscopy by Dr. Loletha Carrow on 02/17/2019 shows diverticulosis in the left colon, 12 mm polyp in the cecum, 4 to 6 mm polyp in the ascending colon and 5 mm polyp in the descending colon.  Pathology was consistent with tubular adenomas.   PLAN:  1.  Hereditary hemochromatosis: - He is taking Jadenu 720 mg in the mornings on an empty stomach. - Denies any abdominal symptoms including nausea vomiting or diarrhea.  No abdominal pain reported. - Ferritin has improved to 156 from 264.  Percent saturation is 60. - Continue Jadenu at same dose 720 mg daily.  Target is less than 50-100. - RTC 2 months for follow-up with repeat CBC, ferritin and iron panel.   2.  Macrocytic anemia: - His hemoglobin has stabilized between 9.5 and 10. - He does not have any symptoms of dizziness or chest pains. - X93 and folic acid today are within normal limits.  Liver function tests were within normal limits.  Possibility is anemia secondary to Jadenu which has been reported in the postmarketing. - Once we get to the target ferritin level below 100, I will scale back on Jadenu dose.   3.  Vitamin D deficiency: - Continue vitamin D supplements.  Orders placed this encounter:  No orders of the defined types were placed in this encounter.    Derek Jack, MD [facility] [phone]   I, Thana Ates, am acting as a scribe for Dr. Derek Jack.  I,  Derek Jack MD, have reviewed the above documentation for accuracy and completeness, and I agree with the above.

## 2021-05-19 ENCOUNTER — Encounter (HOSPITAL_COMMUNITY): Payer: Self-pay

## 2021-05-19 NOTE — Progress Notes (Signed)
Message from Clementeen Graham, MD- Please call and let him know that his iron has come down to 154.  Continue same dose of Jadenu.  Patient's wife Anne Ng aware and agreeable to plan to stay on same dose of Jadenu.

## 2021-05-20 LAB — PROTEIN ELECTROPHORESIS, SERUM
A/G Ratio: 1.2 (ref 0.7–1.7)
Albumin ELP: 4 g/dL (ref 2.9–4.4)
Alpha-1-Globulin: 0.3 g/dL (ref 0.0–0.4)
Alpha-2-Globulin: 0.7 g/dL (ref 0.4–1.0)
Beta Globulin: 1.4 g/dL — ABNORMAL HIGH (ref 0.7–1.3)
Gamma Globulin: 1.1 g/dL (ref 0.4–1.8)
Globulin, Total: 3.4 g/dL (ref 2.2–3.9)
Total Protein ELP: 7.4 g/dL (ref 6.0–8.5)

## 2021-05-31 ENCOUNTER — Other Ambulatory Visit (HOSPITAL_COMMUNITY): Payer: Self-pay

## 2021-06-06 ENCOUNTER — Other Ambulatory Visit: Payer: Self-pay | Admitting: Family

## 2021-06-06 ENCOUNTER — Other Ambulatory Visit (HOSPITAL_COMMUNITY): Payer: Self-pay

## 2021-06-15 ENCOUNTER — Encounter (HOSPITAL_COMMUNITY): Payer: Self-pay | Admitting: Emergency Medicine

## 2021-06-15 NOTE — Progress Notes (Signed)
PA denied for deferasirox 180 mg tablets and appeal submitted via cover my meds to [employer].

## 2021-06-15 NOTE — Progress Notes (Signed)
Started PA via cover my meds for deferasirox 180 mg tablets, followed up with telephone call from [facility] to complete additional information need at 10:55am.  Awaiting approval.

## 2021-06-20 ENCOUNTER — Other Ambulatory Visit (HOSPITAL_COMMUNITY): Payer: Self-pay

## 2021-06-22 ENCOUNTER — Other Ambulatory Visit: Payer: Self-pay | Admitting: Cardiology

## 2021-06-24 ENCOUNTER — Other Ambulatory Visit (HOSPITAL_COMMUNITY): Payer: Self-pay

## 2021-06-27 ENCOUNTER — Ambulatory Visit (INDEPENDENT_AMBULATORY_CARE_PROVIDER_SITE_OTHER): Payer: Medicare PPO | Admitting: Primary Care

## 2021-06-27 ENCOUNTER — Encounter: Payer: Self-pay | Admitting: Primary Care

## 2021-06-27 ENCOUNTER — Telehealth: Payer: Self-pay | Admitting: Internal Medicine

## 2021-06-27 VITALS — HR 108 | Temp 102.0°F

## 2021-06-27 DIAGNOSIS — Z20822 Contact with and (suspected) exposure to covid-19: Secondary | ICD-10-CM | POA: Insufficient documentation

## 2021-06-27 DIAGNOSIS — J069 Acute upper respiratory infection, unspecified: Secondary | ICD-10-CM

## 2021-06-27 MED ORDER — NIRMATRELVIR/RITONAVIR (PAXLOVID)TABLET: 3.0000 | ORAL_TABLET | Freq: Two times a day (BID) | ORAL | 0 refills | Status: AC

## 2021-06-27 NOTE — Assessment & Plan Note (Signed)
Symptoms suspicious for COVID-19 infection, especially as his wife tested positive today with symptom onset 3 days ago.  It is safe to presume that he has COVID-19 infection given this information.  I reviewed his medical history, medications, recent labs.  I also reviewed the information for Paxlovid including contraindications, warnings, adverse effects.  There is no mention of an interaction with Paxlovid and any of the medications he is taking.   Patient wishes to proceed with Paxlovid treatment, his wife was treated with this earlier today.  He is at high risk for hospitalization given his medical history and age.  Prescription for Paxlovid 150-100 mg tablet sent to pharmacy.  Discussed instructions for use, potential side effects, and provided strict ED precautions.  He and his wife verbalized understanding.

## 2021-06-27 NOTE — Telephone Encounter (Signed)
Pt called stating that his wife tested positive for covid but he tested negative. Pt states that he feels bad like he might got the flu bad cough and body aches. Pt is asking what should he do or could he get something called in. Please advise.

## 2021-06-27 NOTE — Patient Instructions (Signed)
Start the Paxlovid antiviral medication as discussed.  Take 3 capsules by mouth twice daily for 5 days.  Start Tylenol 500 mg every 8 hours as needed for fever, body aches, headaches.  Be sure to stay hydrated with plenty of water.  Go to the hospital if you experience significant trouble breathing, and or you feel much worse.  It was a pleasure meeting you!

## 2021-06-27 NOTE — Telephone Encounter (Signed)
Called and spoke to pt to advise of Dr Alla German message. He was upset that Dr Silvio Pate would not call anything in for him. I happened to glance over at his med list and saw Paxlovid with today's date. Pt did a phone visit with Allie Bossier, NP at 2:20 today. Pt states he did not talk to [facility] today. I told him that per his conversation with Candis Musa was sent in so he can go to the pharmacy to get it. I spoke to Anda Kraft to make sure she spoke to him. She stated both he and his wife were on the call. Will forward to Dr Silvio Pate to let him know of the possible memory recall issue.

## 2021-06-27 NOTE — Progress Notes (Signed)
Aaron Sexton - 76 y.o. male   MRN [id number]   Date of Birth: April 14, 1946  PCP: Karie Schwalbe, MD  This service was provided via telemedicine. Phone Visit performed on 06/27/2021    Rationale for phone visit along with limitations reviewed. I discussed the limitations, risks, security and privacy concerns of performing a phone visit and the availability of in person appointments. I also discussed with the patient that there may be a patient responsible charge related to this service. Patient consented to telephone encounter.    Location of patient: Home Location of provider: Office at [employer] @ [facility] Name of referring provider: N/A Participants: Patient, his wife, myself   Names of persons and role in encounter: Provider: Doreene Nest, NP  Patient: Aaron Sexton  Other: N/A   Time on call: 5 min - 12 sec   Subjective: Chief Complaint  Patient presents with   Covid Exposure    Wife tested positive yesterday      HPI:  Aaron Sexton is a 76 year old male patient of Dr. Alphonsus Sias with a history of peripheral vascular disease, carotid arterial disease, cerebrovascular disease, hyperlipidemia who presents today to discuss cough.  He also reports body aches and fevers. His checked his temperature today which was 102.3. Symptom onset last night.   His wife has Covid-19 infection, diagnosed today, and her symptoms began 3 days ago.   He tested negative for COVID last night, has yet to test again today.  Today he is feeling worse.   Objective/Observations:  No physical exam or vital signs collected unless specifically identified below.   Pulse (!) 108    Temp (!) 102 F (38.9 C) (Oral)    SpO2 95%    Respiratory status: speaks in complete sentences without evident shortness of breath.  Dry cough noted once during visit.  Alert and oriented, answers all questions appropriately.  Assessment/Plan:    Viral URI with cough Symptoms suspicious for COVID-19  infection, especially as his wife tested positive today with symptom onset 3 days ago.  It is safe to presume that he has COVID-19 infection given this information.  I reviewed his medical history, medications, recent labs.  I also reviewed the information for Paxlovid including contraindications, warnings, adverse effects.  There is no mention of an interaction with Paxlovid and any of the medications he is taking.   Patient wishes to proceed with Paxlovid treatment, his wife was treated with this earlier today.  He is at high risk for hospitalization given his medical history and age.  Prescription for Paxlovid 150-100 mg tablet sent to pharmacy.  Discussed instructions for use, potential side effects, and provided strict ED precautions.  He and his wife verbalized understanding.  Exposure to COVID-19 virus Symptoms suspicious for COVID-19 infection, especially as his wife tested positive today with symptom onset 3 days ago.  It is safe to presume that he has COVID-19 infection given this information.  I reviewed his medical history, medications, recent labs.  I also reviewed the information for Paxlovid including contraindications, warnings, adverse effects.  There is no mention of an interaction with Paxlovid and any of the medications he is taking.   Patient wishes to proceed with Paxlovid treatment, his wife was treated with this earlier today.  He is at high risk for hospitalization given his medical history and age.  Prescription for Paxlovid 150-100 mg tablet sent to pharmacy.  Discussed instructions for use, potential side effects, and provided strict ED precautions.  He and his wife verbalized understanding.   I discussed the assessment and treatment plan with the patient. The patient was provided an opportunity to ask questions and all were answered. The patient agreed with the plan and demonstrated an understanding of the instructions.  Lab Orders  No laboratory test(s)  ordered today    Meds ordered this encounter  Medications   nirmatrelvir/ritonavir EUA (PAXLOVID) 20 x 150 MG & 10 x 100MG  TABS    Sig: Take 3 tablets by mouth 2 (two) times daily for 5 days. Patient GFR is >60    Dispense:  30 tablet    Refill:  0    Order Specific Question:   Supervising Provider    Answer:   Ermalene Searing, AMY E [[id number]]    The patient was advised to call back or seek an in-person evaluation if the symptoms worsen or if the condition fails to improve as anticipated.  Doreene Nest, NP

## 2021-06-27 NOTE — Telephone Encounter (Signed)
That is surprising He does have Rx for paxlovid to start---so that is good We can review memory issues at next wellness visit (unless it is noticeable and he comes in before)

## 2021-07-05 ENCOUNTER — Other Ambulatory Visit (HOSPITAL_COMMUNITY): Payer: Self-pay

## 2021-07-07 ENCOUNTER — Other Ambulatory Visit: Payer: Self-pay | Admitting: Internal Medicine

## 2021-07-07 ENCOUNTER — Other Ambulatory Visit: Payer: Self-pay | Admitting: Cardiology

## 2021-07-07 NOTE — Telephone Encounter (Signed)
Name of Medication: tramadol 50 mg ?Name of Pharmacy: [facility] ?Last Fill or Written Date and Quantity: # 90 on 06/06/21 ?Last Office Visit and Type: 06/27/21 covid exposure & 12/10/2020 medicare wellness ?Next Office Visit and Type: 12/14/2021 CPX ?Last Controlled Substance Agreement Date:none seen  ?Last ZOX:WRUE seen ?  ?

## 2021-07-09 ENCOUNTER — Encounter: Payer: Self-pay | Admitting: Internal Medicine

## 2021-07-11 ENCOUNTER — Telehealth: Payer: Self-pay

## 2021-07-11 NOTE — Telephone Encounter (Signed)
Pt called asking if he can take Mucinex D. I advised him that it would be best that he take that but that he can take Mucinex DM or Coricidin HBP. ?

## 2021-07-13 NOTE — Telephone Encounter (Signed)
Pt sted he is feeling much better because of mucasnax he received and do not need appt any longer ?

## 2021-07-19 ENCOUNTER — Inpatient Hospital Stay (HOSPITAL_COMMUNITY): Payer: Medicare PPO | Attending: Hematology | Admitting: Hematology

## 2021-07-19 ENCOUNTER — Other Ambulatory Visit: Payer: Self-pay

## 2021-07-19 ENCOUNTER — Inpatient Hospital Stay (HOSPITAL_COMMUNITY): Payer: Medicare PPO

## 2021-07-19 DIAGNOSIS — D539 Nutritional anemia, unspecified: Secondary | ICD-10-CM | POA: Diagnosis not present

## 2021-07-19 DIAGNOSIS — E559 Vitamin D deficiency, unspecified: Secondary | ICD-10-CM | POA: Diagnosis not present

## 2021-07-19 DIAGNOSIS — M1990 Unspecified osteoarthritis, unspecified site: Secondary | ICD-10-CM | POA: Insufficient documentation

## 2021-07-19 DIAGNOSIS — I739 Peripheral vascular disease, unspecified: Secondary | ICD-10-CM | POA: Diagnosis not present

## 2021-07-19 DIAGNOSIS — Z801 Family history of malignant neoplasm of trachea, bronchus and lung: Secondary | ICD-10-CM | POA: Diagnosis not present

## 2021-07-19 DIAGNOSIS — Z7982 Long term (current) use of aspirin: Secondary | ICD-10-CM | POA: Insufficient documentation

## 2021-07-19 DIAGNOSIS — K5730 Diverticulosis of large intestine without perforation or abscess without bleeding: Secondary | ICD-10-CM | POA: Insufficient documentation

## 2021-07-19 DIAGNOSIS — Z79899 Other long term (current) drug therapy: Secondary | ICD-10-CM | POA: Diagnosis not present

## 2021-07-19 DIAGNOSIS — E785 Hyperlipidemia, unspecified: Secondary | ICD-10-CM | POA: Insufficient documentation

## 2021-07-19 DIAGNOSIS — Z87891 Personal history of nicotine dependence: Secondary | ICD-10-CM | POA: Insufficient documentation

## 2021-07-19 DIAGNOSIS — I2510 Atherosclerotic heart disease of native coronary artery without angina pectoris: Secondary | ICD-10-CM | POA: Diagnosis not present

## 2021-07-19 DIAGNOSIS — Z8616 Personal history of COVID-19: Secondary | ICD-10-CM | POA: Diagnosis not present

## 2021-07-19 DIAGNOSIS — K219 Gastro-esophageal reflux disease without esophagitis: Secondary | ICD-10-CM | POA: Insufficient documentation

## 2021-07-19 DIAGNOSIS — Z8719 Personal history of other diseases of the digestive system: Secondary | ICD-10-CM | POA: Insufficient documentation

## 2021-07-19 LAB — CBC WITH DIFFERENTIAL/PLATELET
Abs Immature Granulocytes: 0.02 10*3/uL (ref 0.00–0.07)
Basophils Absolute: 0.1 10*3/uL (ref 0.0–0.1)
Basophils Relative: 1 %
Eosinophils Absolute: 0.2 10*3/uL (ref 0.0–0.5)
Eosinophils Relative: 3 %
HCT: 28.2 % — ABNORMAL LOW (ref 39.0–52.0)
Hemoglobin: 9.3 g/dL — ABNORMAL LOW (ref 13.0–17.0)
Immature Granulocytes: 0 %
Lymphocytes Relative: 31 %
Lymphs Abs: 2.4 10*3/uL (ref 0.7–4.0)
MCH: 38.9 pg — ABNORMAL HIGH (ref 26.0–34.0)
MCHC: 33 g/dL (ref 30.0–36.0)
MCV: 118 fL — ABNORMAL HIGH (ref 80.0–100.0)
Monocytes Absolute: 1 10*3/uL (ref 0.1–1.0)
Monocytes Relative: 13 %
Neutro Abs: 3.9 10*3/uL (ref 1.7–7.7)
Neutrophils Relative %: 52 %
Platelets: 328 10*3/uL (ref 150–400)
RBC: 2.39 MIL/uL — ABNORMAL LOW (ref 4.22–5.81)
RDW: 14.8 % (ref 11.5–15.5)
WBC: 7.5 10*3/uL (ref 4.0–10.5)
nRBC: 0.5 % — ABNORMAL HIGH (ref 0.0–0.2)

## 2021-07-19 LAB — IRON AND TIBC
Iron: 211 ug/dL — ABNORMAL HIGH (ref 45–182)
Saturation Ratios: 87 % — ABNORMAL HIGH (ref 17.9–39.5)
TIBC: 243 ug/dL — ABNORMAL LOW (ref 250–450)
UIBC: 32 ug/dL

## 2021-07-19 LAB — FERRITIN: Ferritin: 213 ng/mL (ref 24–336)

## 2021-07-19 LAB — COMPREHENSIVE METABOLIC PANEL
ALT: 14 U/L (ref 0–44)
AST: 16 U/L (ref 15–41)
Albumin: 3.9 g/dL (ref 3.5–5.0)
Alkaline Phosphatase: 43 U/L (ref 38–126)
Anion gap: 11 (ref 5–15)
BUN: 20 mg/dL (ref 8–23)
CO2: 23 mmol/L (ref 22–32)
Calcium: 8.7 mg/dL — ABNORMAL LOW (ref 8.9–10.3)
Chloride: 100 mmol/L (ref 98–111)
Creatinine, Ser: 1.19 mg/dL (ref 0.61–1.24)
GFR, Estimated: >60 mL/min (ref >60)
Glucose, Bld: 98 mg/dL (ref 70–99)
Potassium: 4 mmol/L (ref 3.5–5.1)
Sodium: 134 mmol/L — ABNORMAL LOW (ref 135–145)
Total Bilirubin: 0.6 mg/dL (ref 0.3–1.2)
Total Protein: 7.5 g/dL (ref 6.5–8.1)

## 2021-07-19 NOTE — Patient Instructions (Signed)
[place] at [facility] ?Discharge Instructions ? ?You were seen and examined today by Dr. Delton Coombes. He reviewed your most recent labs and everything looks okay. Please keep follow up appointments as scheduled in 2 months. ? ? ?Thank you for choosing [place] at [facility] to provide your oncology and hematology care.  To afford each patient quality time with our provider, please arrive at least 15 minutes before your scheduled appointment time.  ? ?If you have a lab appointment with the [facility] please come in thru the Main Entrance and check in at the main information desk. ? ?You need to re-schedule your appointment should you arrive 10 or more minutes late.  We strive to give you quality time with our providers, and arriving late affects you and other patients whose appointments are after yours.  Also, if you no show three or more times for appointments you may be dismissed from the clinic at the providers discretion.     ?Again, thank you for choosing [facility].  Our hope is that these requests will decrease the amount of time that you wait before being seen by our physicians.       ?_____________________________________________________________ ? ?Should you have questions after your visit to [facility], please contact our office at [phone] and follow the prompts.  Our office hours are 8:00 a.m. and 4:30 p.m. Monday - Friday.  Please note that voicemails left after 4:00 p.m. may not be returned until the following business day.  We are closed weekends and major holidays.  You do have access to a nurse 24-7, just call the main number to the clinic [phone] and do not press any options, hold on the line and a nurse will answer the phone.   ? ?For prescription refill requests, have your pharmacy contact our office and allow 72 hours.   ? ?Due to Covid, you will need to wear a mask upon entering the hospital. If you do  not have a mask, a mask will be given to you at the Main Entrance upon arrival. For doctor visits, patients may have 1 support person age 105 or older with them. For treatment visits, patients can not have anyone with them due to social distancing guidelines and our immunocompromised population.  ? ?  ?

## 2021-07-19 NOTE — Progress Notes (Signed)
? ?[facility] ?[address] ?[place], [place] [address] ? ? ?CLINIC:  ?Medical Oncology/Hematology ? ?PCP:  ?Aaron Carbon, MD ?[address] [place] [address]  ?[phone] ? ?REASON FOR VISIT:  ?Follow-up for hereditary hemochromatosis ? ?PRIOR THERAPY: none ? ?CURRENT THERAPY: Deferasirox 720 mg daily ? ?INTERVAL HISTORY:  ?[name], a 76 y.o. 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 Aaron Sexton was last seen on 05/18/2021. ? ?Today he reports feeling good. He had a Covid infection and was unable to take Jadenu for 5 days while taking Paxlovid starting 02/20. At the time of his Covid infection he had fever and cough, and he denies loss of appetite or taste.  ? ?REVIEW OF SYSTEMS:  ?Review of Systems  ?Constitutional:  Positive for fatigue. Negative for appetite change.  ?Respiratory:  Positive for cough.   ?Musculoskeletal:  Positive for arthralgias (4/10 legs and knees).  ?Neurological:  Positive for numbness.  ?All other systems reviewed and are negative. ? ?PAST MEDICAL/SURGICAL HISTORY:  ?Past Medical History:  ?Diagnosis Date  ? Anemia   ? Arteriosclerotic cardiovascular disease (ASCVD) 1993  ? Critical RCA disease in 1993 treated with PTCA  ? Cerebrovascular disease   ? Moderate ASVD without focal stenosis in 01/2007  ? Colon polyps 3/12  ? single 47m polyp--tubular adenoma  ? Diverticulosis 2012  ? found on colonoscopy  ? Erectile dysfunction   ? Family history of adverse reaction to anesthesia   ? difficult for son & sistor to wake   ? GERD (gastroesophageal reflux disease)   ? Hematochezia 05/24/2010  ? Hyperlipidemia   ? Osteoarthritis   ? knees/hands-Dr LRonnie Derby ? Overweight(278.02)   ? [place] [facility] 03/[place]  ? Moderate SFA stenosis; history of claudication  ? Pneumonia   ? Rosacea   ? Tobacco abuse, in remission   ? 30-40 pack years discontinued in 1993  ? Vitamin B12 deficiency   ? ?Past Surgical History:   ?Procedure Laterality Date  ? APPENDECTOMY    ? COLONOSCOPY W/ POLYPECTOMY  2012  ? ILIAC VEIN ANGIOPLASTY / STENTING Right 08/09/2015  ? PERIPHERAL VASCULAR CATHETERIZATION Bilateral 06/21/2015  ? Procedure: Lower Extremity Angiography;  Surgeon: JLorretta Harp MD;  [facility];  Service: Cardiovascular;  Laterality: Bilateral;  ? PERIPHERAL VASCULAR CATHETERIZATION N/A 06/21/2015  ? Procedure: Abdominal Aortogram;  Surgeon: JLorretta Harp MD;  [facility];  Service: Cardiovascular;  Laterality: N/A;  ? PERIPHERAL VASCULAR CATHETERIZATION N/A 07/26/2015  ? Procedure: Lower Extremity Angiography;  Surgeon: JLorretta Harp MD;  [facility];  Service: Cardiovascular;  Laterality: N/A;  ? PERIPHERAL VASCULAR CATHETERIZATION N/A 08/09/2015  ? Procedure: Lower Extremity Angiography;  Surgeon: JLorretta Harp MD;  [facility];  Service: Cardiovascular;  Laterality: N/A;  ? PERIPHERAL VASCULAR CATHETERIZATION  08/09/2015  ? Procedure: Peripheral Vascular Intervention;  Surgeon: JLorretta Harp MD;  [facility];  Service: Cardiovascular;;  rt ext. iliac atherectomy and stent  ? PERIPHERAL VASCULAR CATHETERIZATION N/A 09/20/2015  ? Procedure: Lower Extremity Angiography;  Surgeon: JLorretta Harp MD;  [facility];  Service: Cardiovascular;  Laterality: N/A;  ? PERIPHERAL VASCULAR CATHETERIZATION Right 09/20/2015  ? Procedure: Peripheral Vascular Intervention;  Surgeon: JLorretta Harp MD;  [facility];  Service: Cardiovascular;  Laterality: Right;  SFA  ? ROTATOR CUFF REPAIR Left 7/15  ?  Dr Ronnie Derby  ? ? ?SOCIAL HISTORY:  ?Social History  ? ?Socioeconomic History  ? Marital status: Married  ?  Spouse name: Not on file  ? Number of children: 3  ? Years of education: Not on file  ? Highest education level: Not on file  ?Occupational History  ? Occupation: Managed supply chain--- retired 2013  ?  Comment:  [employer]  ?Tobacco Use  ? Smoking status: Former  ?  Packs/day: 1.00  ?  Years: 30.00  ?  Pack years: 30.00  ?  Types: Cigarettes  ?  Quit date: 05/16/1990  ?  Years since quitting: 31.1  ? Smokeless tobacco: Never  ? Tobacco comments:  ?  Quit in 1993  ?Vaping Use  ? Vaping Use: Never used  ?Substance and Sexual Activity  ? Alcohol use: No  ?  Alcohol/week: 0.0 standard drinks  ? Drug use: No  ? Sexual activity: Not on file  ?Other Topics Concern  ? Not on file  ?Social History Narrative  ? Has living will  ? Wife is health care POA---alternate is son or daughter  ? Would accept resuscitation but no prolonged artificial ventilation.  ? No tube feeds if cognitively unaware  ? ?Social Determinants of Health  ? ?Financial Resource Strain: Not on file  ?Food Insecurity: Not on file  ?Transportation Needs: Not on file  ?Physical Activity: Not on file  ?Stress: Not on file  ?Social Connections: Not on file  ?Intimate Partner Violence: Not on file  ? ? ?FAMILY HISTORY:  ?Family History  ?Problem Relation Age of Onset  ? Hypertension Father   ?     And siblings  ? Heart disease Father   ?     And second-degree relatives  ? Transient ischemic attack Father   ? Lung cancer Mother   ? Diabetes Mother   ? Brain cancer Sister   ? Diabetes Brother   ? Atrial fibrillation Brother   ? Heart disease Sister   ? Heart disease Sister   ? Thyroid disease Sister   ? Ulcers Son   ? Diabetes Son   ? Diabetes Son   ? ? ?CURRENT MEDICATIONS:  ?Current Outpatient Medications  ?Medication Sig Dispense Refill  ? Acetaminophen (TYLENOL) 325 MG CAPS Tylenol 325 mg capsule ? Take 1 capsule 3 times a day by oral route.    ? aspirin 81 MG tablet Take 81 mg by mouth daily.    ? chlorthalidone (HYGROTON) 25 MG tablet TAKE 1/2 TABLET(12.5 MG) BY MOUTH DAILY 30 tablet 1  ? cimetidine (TAGAMET) 400 MG tablet Take 400 mg by mouth 2 (two) times daily as needed (indigestion).     ? clopidogrel (PLAVIX) 75 MG tablet TAKE 1 TABLET(75 MG) BY  MOUTH DAILY WITH BREAKFAST 30 tablet 9  ? Cyanocobalamin (VITAMIN B-12) 500 MCG SUBL Place 500 mcg under the tongue daily.    ? Deferasirox 180 MG TABS TAKE 4 TABLETS BY MOUTH DAILY 120 tablet 2  ? losartan (COZAAR) 25 MG tablet TAKE ONE TABLET BY MOUTH EVERY DAY 90 tablet 3  ? metoprolol succinate (TOPROL-XL) 100 MG 24 hr tablet TAKE 1 TABLET(100 MG) BY MOUTH DAILY WITH OR IMMEDIATELY FOLLOWING A MEAL 90 tablet 3  ? nitroGLYCERIN (NITROSTAT) 0.4 MG SL tablet Take by mouth.    ? Omega-3 Fatty Acids (FISH OIL PO) Take 1,000 mg by mouth daily.    ? pravastatin (PRAVACHOL) 40 MG tablet TAKE 1 TABLET(40 MG) BY MOUTH DAILY 90  tablet 3  ? Probiotic Product (PROBIOTIC PO) Take by mouth.    ? traMADol (ULTRAM) 50 MG tablet TAKE 1 TABLET BY MOUTH EVERY 8 HOURS AS NEEDED 90 tablet 0  ? Vitamin D, Ergocalciferol, (DRISDOL) 1.25 MG (50000 UNIT) CAPS capsule TAKE 1 CAPSULE BY MOUTH 1 TIME A WEEK 12 capsule 2  ? ?No current facility-administered medications for this visit.  ? ? ?ALLERGIES:  ?Allergies  ?Allergen Reactions  ? Diltiazem Hcl Hives and Rash  ? Cardizem [Diltiazem Hcl]   ? ? ?PHYSICAL EXAM:  ?Performance status (ECOG): 0 - Asymptomatic ? ?Vitals:  ? 07/19/21 1520  ?BP: (!) 143/54  ?Pulse: 83  ?Resp: 18  ?Temp: 97.6 ?F (36.4 ?C)  ?SpO2: 97%  ? ?Wt Readings from Last 3 Encounters:  ?07/19/21 206 lb 12.8 oz (93.8 kg)  ?05/18/21 210 lb 8 oz (95.5 kg)  ?03/16/21 209 lb 1 oz (94.8 kg)  ? ?Physical Exam ?Vitals reviewed.  ?Constitutional:   ?   Appearance: Normal appearance. He is obese.  ?Cardiovascular:  ?   Rate and Rhythm: Normal rate and regular rhythm.  ?   Pulses: Normal pulses.  ?   Heart sounds: Normal heart sounds.  ?Pulmonary:  ?   Effort: Pulmonary effort is normal.  ?   Breath sounds: Normal breath sounds.  ?Neurological:  ?   General: No focal deficit present.  ?   Mental Status: He is alert and oriented to person, place, and time.  ?Psychiatric:     ?   Mood and Affect: Mood normal.     ?   Behavior: Behavior  normal.  ? ? ?LABORATORY DATA:  ?I have reviewed the labs as listed.  ?CBC Latest Ref Rng & Units 07/19/2021 05/18/2021 03/16/2021  ?WBC 4.0 - 10.5 K/uL 7.5 8.1 9.6  ?Hemoglobin 13.0 - 17.0 g/dL 9.3(L) 9.5(L) 9.1

## 2021-07-26 ENCOUNTER — Other Ambulatory Visit (HOSPITAL_COMMUNITY): Payer: Self-pay

## 2021-08-04 ENCOUNTER — Other Ambulatory Visit (HOSPITAL_COMMUNITY): Payer: Self-pay | Admitting: Hematology

## 2021-08-04 ENCOUNTER — Other Ambulatory Visit (HOSPITAL_COMMUNITY): Payer: Self-pay

## 2021-08-04 MED ORDER — DEFERASIROX 180 MG PO TABS
ORAL_TABLET | ORAL | 2 refills | Status: DC
  Filled 2021-08-04: qty 120, 30d supply, fill #0
  Filled 2021-08-31: qty 120, 30d supply, fill #1
  Filled 2021-10-04: qty 120, 30d supply, fill #2

## 2021-08-05 ENCOUNTER — Other Ambulatory Visit: Payer: Self-pay | Admitting: Internal Medicine

## 2021-08-05 NOTE — Telephone Encounter (Signed)
Last filled 07-07-21 #90 ?Last OV 12-10-20 ?Next OV 12-14-21 ?[facility] ?

## 2021-08-10 ENCOUNTER — Other Ambulatory Visit (HOSPITAL_COMMUNITY): Payer: Self-pay

## 2021-08-19 ENCOUNTER — Other Ambulatory Visit (HOSPITAL_COMMUNITY): Payer: Self-pay

## 2021-08-29 ENCOUNTER — Other Ambulatory Visit (HOSPITAL_COMMUNITY): Payer: Self-pay

## 2021-08-31 ENCOUNTER — Ambulatory Visit: Payer: Medicare PPO | Admitting: Student

## 2021-08-31 ENCOUNTER — Other Ambulatory Visit (HOSPITAL_COMMUNITY): Payer: Self-pay

## 2021-08-31 NOTE — Progress Notes (Signed)
? ?Cardiology Office Note   ? ?Date:  09/01/2021  ? ?ID:  Aaron Sexton, DOB 1945/08/16, MRN [id number] ? ?PCP:  Karie Schwalbe, MD  ?Cardiologist: Dina Rich, MD   ?PV: Dr. Allyson Sabal ? ?Chief Complaint  ?Patient presents with  ? Follow-up  ?  Overdue Visit  ? ? ?History of Present Illness:   ? ?Aaron Sexton is a 76 y.o. male with past medical history of CAD (s/p PTCA to RCA in 1993), carotid artery stenosis, PAD (s/p stenting of right EIA in 2017 with failed attempt at left SFA stenting), hemochromatosis, anemia, HTN and HLD who presents to the office today for overdue follow-up. ? ?He was last examined by Dr. Wyline Mood in 05/2019 and denied any recent anginal symptoms. He was continued on his current medical therapy including ASA 81 mg daily, Chlorthalidone 12.5 mg daily, Plavix 75 mg daily, Losartan 25 mg daily, Toprol-XL 100 mg daily and Pravastatin 40 mg daily. He did see Dr. Allyson Sabal in 12/2019 reported having lower extremity claudication but symptoms had been stable and conservative medical management was recommended (previously failed Pletal).  ? ?In talking with the patient today, he reports having worsening claudication symptoms for the past few months. Notices pain along his lower legs with activity and symptoms improve with rest. Also experiences neuropathy. He denies any recent chest pain or dyspnea on exertion. No recent orthopnea or PND. He does experience intermittent lower extremity edema and wears compression stockings.  ? ? ?Past Medical History:  ?Diagnosis Date  ? Anemia   ? Arteriosclerotic cardiovascular disease (ASCVD) 1993  ? Critical RCA disease in 1993 treated with PTCA  ? Cerebrovascular disease   ? Moderate ASVD without focal stenosis in 01/2007  ? Colon polyps 3/12  ? single 2mm polyp--tubular adenoma  ? Diverticulosis 2012  ? found on colonoscopy  ? Erectile dysfunction   ? Family history of adverse reaction to anesthesia   ? difficult for son & sistor to wake   ? GERD  (gastroesophageal reflux disease)   ? Hematochezia 05/24/2010  ? Hyperlipidemia   ? Osteoarthritis   ? knees/hands-Dr Sherlean Foot  ? Overweight(278.02)   ? Peripheral vascular disease ([facility]) 03/11/2010  ? Moderate SFA stenosis; history of claudication  ? Pneumonia   ? Rosacea   ? Tobacco abuse, in remission   ? 30-40 pack years discontinued in 1993  ? Vitamin B12 deficiency   ? ? ?Past Surgical History:  ?Procedure Laterality Date  ? APPENDECTOMY    ? COLONOSCOPY W/ POLYPECTOMY  2012  ? ILIAC VEIN ANGIOPLASTY / STENTING Right 08/09/2015  ? PERIPHERAL VASCULAR CATHETERIZATION Bilateral 06/21/2015  ? Procedure: Lower Extremity Angiography;  Surgeon: Runell Gess, MD;  Location: [facility] INVASIVE CV LAB;  Service: Cardiovascular;  Laterality: Bilateral;  ? PERIPHERAL VASCULAR CATHETERIZATION N/A 06/21/2015  ? Procedure: Abdominal Aortogram;  Surgeon: Runell Gess, MD;  Location: [facility] INVASIVE CV LAB;  Service: Cardiovascular;  Laterality: N/A;  ? PERIPHERAL VASCULAR CATHETERIZATION N/A 07/26/2015  ? Procedure: Lower Extremity Angiography;  Surgeon: Runell Gess, MD;  Location: [facility] INVASIVE CV LAB;  Service: Cardiovascular;  Laterality: N/A;  ? PERIPHERAL VASCULAR CATHETERIZATION N/A 08/09/2015  ? Procedure: Lower Extremity Angiography;  Surgeon: Runell Gess, MD;  Location: [facility] INVASIVE CV LAB;  Service: Cardiovascular;  Laterality: N/A;  ? PERIPHERAL VASCULAR CATHETERIZATION  08/09/2015  ? Procedure: Peripheral Vascular Intervention;  Surgeon: Runell Gess, MD;  Location: [facility] INVASIVE CV LAB;  Service: Cardiovascular;;  rt ext. iliac atherectomy  and stent  ? PERIPHERAL VASCULAR CATHETERIZATION N/A 09/20/2015  ? Procedure: Lower Extremity Angiography;  Surgeon: Runell Gess, MD;  Location: [facility] INVASIVE CV LAB;  Service: Cardiovascular;  Laterality: N/A;  ? PERIPHERAL VASCULAR CATHETERIZATION Right 09/20/2015  ? Procedure: Peripheral Vascular Intervention;  Surgeon: Runell Gess, MD;  Location: [facility] INVASIVE CV LAB;   Service: Cardiovascular;  Laterality: Right;  SFA  ? ROTATOR CUFF REPAIR Left 7/15  ? Dr Sherlean Foot  ? ? ?Current Medications: ?Outpatient Medications Prior to Visit  ?Medication Sig Dispense Refill  ? Acetaminophen (TYLENOL) 325 MG CAPS Tylenol 325 mg capsule ? Take 1 capsule 3 times a day by oral route.    ? aspirin 81 MG tablet Take 81 mg by mouth daily.    ? chlorthalidone (HYGROTON) 25 MG tablet TAKE 1/2 TABLET(12.5 MG) BY MOUTH DAILY 30 tablet 1  ? cimetidine (TAGAMET) 400 MG tablet Take 400 mg by mouth 2 (two) times daily as needed (indigestion).     ? clopidogrel (PLAVIX) 75 MG tablet TAKE 1 TABLET(75 MG) BY MOUTH DAILY WITH BREAKFAST 30 tablet 9  ? Cyanocobalamin (VITAMIN B-12) 500 MCG SUBL Place 500 mcg under the tongue daily.    ? Deferasirox 180 MG TABS TAKE 4 TABLETS BY MOUTH DAILY 120 tablet 2  ? losartan (COZAAR) 25 MG tablet TAKE ONE TABLET BY MOUTH EVERY DAY 90 tablet 3  ? metoprolol succinate (TOPROL-XL) 100 MG 24 hr tablet TAKE 1 TABLET(100 MG) BY MOUTH DAILY WITH OR IMMEDIATELY FOLLOWING A MEAL 90 tablet 3  ? nitroGLYCERIN (NITROSTAT) 0.4 MG SL tablet Take by mouth.    ? Omega-3 Fatty Acids (FISH OIL PO) Take 1,000 mg by mouth daily.    ? pravastatin (PRAVACHOL) 40 MG tablet TAKE 1 TABLET(40 MG) BY MOUTH DAILY 90 tablet 3  ? Probiotic Product (PROBIOTIC PO) Take by mouth.    ? traMADol (ULTRAM) 50 MG tablet TAKE 1 TABLET BY MOUTH EVERY 8 HOURS AS NEEDED 90 tablet 0  ? Vitamin D, Ergocalciferol, (DRISDOL) 1.25 MG (50000 UNIT) CAPS capsule TAKE 1 CAPSULE BY MOUTH 1 TIME A WEEK 12 capsule 2  ? ?No facility-administered medications prior to visit.  ?  ? ?Allergies:   Diltiazem hcl and Cardizem [diltiazem hcl]  ? ?Social History  ? ?Socioeconomic History  ? Marital status: Married  ?  Spouse name: Not on file  ? Number of children: 3  ? Years of education: Not on file  ? Highest education level: Not on file  ?Occupational History  ? Occupation: Managed supply chain--- retired 2013  ?  Comment: [employer]  ?Tobacco Use  ? Smoking status: Former  ?  Packs/day: 1.00  ?  Years: 30.00  ?  Pack years: 30.00  ?  Types: Cigarettes  ?  Quit date: 05/16/1990  ?  Years since quitting: 31.3  ? Smokeless tobacco: Never  ? Tobacco comments:  ?  Quit in 1993  ?Vaping Use  ? Vaping Use: Never used  ?Substance and Sexual Activity  ? Alcohol use: No  ?  Alcohol/week: 0.0 standard drinks  ? Drug use: No  ? Sexual activity: Not on file  ?Other Topics Concern  ? Not on file  ?Social History Narrative  ? Has living will  ? Wife is health care POA---alternate is son or daughter  ? Would accept resuscitation but no prolonged artificial ventilation.  ? No tube feeds if cognitively unaware  ? ?Social Determinants of Health  ? ?Physicist, medical  Strain: Not on file  ?Food Insecurity: Not on file  ?Transportation Needs: Not on file  ?Physical Activity: Not on file  ?Stress: Not on file  ?Social Connections: Not on file  ?  ? ?Family History:  The patient's family history includes Atrial fibrillation in his brother; Brain cancer in his sister; Diabetes in his brother, mother, son, and son; Heart disease in his father, sister, and sister; Hypertension in his father; Lung cancer in his mother; Thyroid disease in his sister; Transient ischemic attack in his father; Ulcers in his son.  ? ?Review of Systems:   ? ?Please see the history of present illness.    ? ?All other systems reviewed and are otherwise negative except as noted above. ? ? ?Physical Exam:   ? ?VS:  BP 140/62   Pulse 80   Ht 5\' 6"  (1.676 m)   Wt 209 lb 12.8 oz (95.2 kg)   SpO2 98%   BMI 33.86 kg/m?    ?General: Well developed, well nourished,male appearing in no acute distress. ?Head: Normocephalic, atraumatic. ?Neck: No carotid bruits. JVD not elevated.  ?Lungs: Respirations regular and unlabored, without wheezes or rales.  ?Heart: Regular rate and rhythm. No S3 or S4.  No murmur, no rubs, or gallops appreciated. ?Abdomen: Appears non-distended. No obvious  abdominal masses. ?Msk:  Strength and tone appear normal for age. No obvious joint deformities or effusions. ?Extremities: No clubbing or cyanosis. Trace lower extremity edema.  Distal pedal pulses are 2+ bilaterally. ?Neuro: Al

## 2021-09-01 ENCOUNTER — Ambulatory Visit: Payer: Medicare PPO | Admitting: Student

## 2021-09-01 ENCOUNTER — Encounter: Payer: Self-pay | Admitting: Student

## 2021-09-01 VITALS — BP 140/62 | HR 80 | Ht 66.0 in | Wt 209.8 lb

## 2021-09-01 DIAGNOSIS — D649 Anemia, unspecified: Secondary | ICD-10-CM | POA: Diagnosis not present

## 2021-09-01 DIAGNOSIS — R600 Localized edema: Secondary | ICD-10-CM | POA: Diagnosis not present

## 2021-09-01 DIAGNOSIS — I739 Peripheral vascular disease, unspecified: Secondary | ICD-10-CM

## 2021-09-01 DIAGNOSIS — E785 Hyperlipidemia, unspecified: Secondary | ICD-10-CM | POA: Diagnosis not present

## 2021-09-01 DIAGNOSIS — I2510 Atherosclerotic heart disease of native coronary artery without angina pectoris: Secondary | ICD-10-CM

## 2021-09-01 DIAGNOSIS — I6523 Occlusion and stenosis of bilateral carotid arteries: Secondary | ICD-10-CM

## 2021-09-01 NOTE — Patient Instructions (Signed)
Medication Instructions:  ?Your physician recommends that you continue on your current medications as directed. Please refer to the Current Medication list given to you today. ? ?*If you need a refill on your cardiac medications before your next appointment, please call your pharmacy* ? ? ?Lab Work: ?NONE  ? ?If you have labs (blood work) drawn today and your tests are completely normal, you will receive your results only by: ?MyChart Message (if you have MyChart) OR ?A paper copy in the mail ?If you have any lab test that is abnormal or we need to change your treatment, we will call you to review the results. ? ? ?Testing/Procedures: ?Your physician has requested that you have an echocardiogram. Echocardiography is a painless test that uses sound waves to create images of your heart. It provides your doctor with information about the size and shape of your heart and how well your heart?s chambers and valves are working. This procedure takes approximately one hour. There are no restrictions for this procedure. ? ?Your physician has requested that you have a lower or upper extremity arterial duplex. This test is an ultrasound of the arteries in the legs or arms. It looks at arterial blood flow in the legs and arms. Allow one hour for Lower and Upper Arterial scans. There are no restrictions or special instructions ? ?Your physician has requested that you have a carotid duplex. This test is an ultrasound of the carotid arteries in your neck. It looks at blood flow through these arteries that supply the brain with blood. Allow one hour for this exam. There are no restrictions or special instructions. ? ? ? ?Follow-Up: ?At [facility], you and your health needs are our priority.  As part of our continuing mission to provide you with exceptional heart care, we have created designated Provider Care Teams.  These Care Teams include your primary Cardiologist (physician) and Advanced Practice Providers (APPs -  Physician  Assistants and Nurse Practitioners) who all work together to provide you with the care you need, when you need it. ? ?We recommend signing up for the patient portal called "MyChart".  Sign up information is provided on this After Visit Summary.  MyChart is used to connect with patients for Virtual Visits (Telemedicine).  Patients are able to view lab/test results, encounter notes, upcoming appointments, etc.  Non-urgent messages can be sent to your provider as well.   ?To learn more about what you can do with MyChart, go to [email or web address].   ? ?Your next appointment:   ? After Test  ? ?The format for your next appointment:   ?In Person ? ?Provider:   ?You may see Carlyle Dolly, MD or one of the following Advanced Practice Providers on your designated Care Team:   ?Bernerd Pho, PA-C  ?Ermalinda Barrios, PA-C .  ? ? ?Other Instructions ?Thank you for choosing [facility]! ?  ? ?Important Information About [facility] ? ? ? ? ? ? ?

## 2021-09-05 ENCOUNTER — Other Ambulatory Visit: Payer: Self-pay | Admitting: Internal Medicine

## 2021-09-05 NOTE — Telephone Encounter (Signed)
Last filled 08-05-21 #90 ?Last OV 12-10-20 ?Next OV 12-14-21 ?[facility] ?

## 2021-09-08 ENCOUNTER — Other Ambulatory Visit (HOSPITAL_COMMUNITY): Payer: Self-pay

## 2021-09-16 ENCOUNTER — Other Ambulatory Visit (HOSPITAL_COMMUNITY): Payer: Self-pay

## 2021-09-16 ENCOUNTER — Ambulatory Visit (HOSPITAL_COMMUNITY)
Admission: RE | Admit: 2021-09-16 | Discharge: 2021-09-16 | Disposition: A | Discharge status: Home / Self Care | Payer: Medicare PPO | Source: Ambulatory Visit | Attending: Student | Admitting: Student

## 2021-09-16 DIAGNOSIS — I2510 Atherosclerotic heart disease of native coronary artery without angina pectoris: Secondary | ICD-10-CM | POA: Insufficient documentation

## 2021-09-16 DIAGNOSIS — R600 Localized edema: Secondary | ICD-10-CM | POA: Insufficient documentation

## 2021-09-16 LAB — ECHOCARDIOGRAM COMPLETE
AR max vel: 2.69 cm2
AV Area VTI: 2.95 cm2
AV Area mean vel: 2.79 cm2
AV Mean grad: 5 mmHg
AV Peak grad: 13.2 mmHg
Ao pk vel: 1.82 m/s
Area-P 1/2: 3.33 cm2
Calc EF: 55.9 %
MV VTI: 3.73 cm2
S' Lateral: 3.5 cm
Single Plane A2C EF: 54 %
Single Plane A4C EF: 57.4 %

## 2021-09-16 NOTE — Progress Notes (Signed)
*  PRELIMINARY RESULTS* ?Echocardiogram ?2D Echocardiogram has been performed. ? ?Aaron Sexton ?09/16/2021, 3:47 PM ?

## 2021-09-21 ENCOUNTER — Ambulatory Visit (HOSPITAL_COMMUNITY)
Admission: RE | Admit: 2021-09-21 | Discharge: 2021-09-21 | Disposition: A | Discharge status: Home / Self Care | Payer: Medicare PPO | Source: Ambulatory Visit | Attending: Hematology | Admitting: Hematology

## 2021-09-21 ENCOUNTER — Inpatient Hospital Stay (HOSPITAL_COMMUNITY): Payer: Medicare PPO | Attending: Hematology

## 2021-09-21 DIAGNOSIS — Z801 Family history of malignant neoplasm of trachea, bronchus and lung: Secondary | ICD-10-CM | POA: Insufficient documentation

## 2021-09-21 DIAGNOSIS — D539 Nutritional anemia, unspecified: Secondary | ICD-10-CM | POA: Insufficient documentation

## 2021-09-21 DIAGNOSIS — K769 Liver disease, unspecified: Secondary | ICD-10-CM | POA: Diagnosis not present

## 2021-09-21 DIAGNOSIS — Z87891 Personal history of nicotine dependence: Secondary | ICD-10-CM | POA: Insufficient documentation

## 2021-09-21 DIAGNOSIS — Z808 Family history of malignant neoplasm of other organs or systems: Secondary | ICD-10-CM | POA: Diagnosis not present

## 2021-09-21 DIAGNOSIS — I700 Atherosclerosis of aorta: Secondary | ICD-10-CM | POA: Diagnosis not present

## 2021-09-21 DIAGNOSIS — K828 Other specified diseases of gallbladder: Secondary | ICD-10-CM | POA: Diagnosis not present

## 2021-09-21 LAB — CBC WITH DIFFERENTIAL/PLATELET
Abs Immature Granulocytes: 0.03 10*3/uL (ref 0.00–0.07)
Basophils Absolute: 0.1 10*3/uL (ref 0.0–0.1)
Basophils Relative: 1 %
Eosinophils Absolute: 0.2 10*3/uL (ref 0.0–0.5)
Eosinophils Relative: 2 %
HCT: 27.9 % — ABNORMAL LOW (ref 39.0–52.0)
Hemoglobin: 9.2 g/dL — ABNORMAL LOW (ref 13.0–17.0)
Immature Granulocytes: 0 %
Lymphocytes Relative: 27 %
Lymphs Abs: 2.5 10*3/uL (ref 0.7–4.0)
MCH: 38.7 pg — ABNORMAL HIGH (ref 26.0–34.0)
MCHC: 33 g/dL (ref 30.0–36.0)
MCV: 117.2 fL — ABNORMAL HIGH (ref 80.0–100.0)
Monocytes Absolute: 1.2 10*3/uL — ABNORMAL HIGH (ref 0.1–1.0)
Monocytes Relative: 13 %
Neutro Abs: 5.1 10*3/uL (ref 1.7–7.7)
Neutrophils Relative %: 57 %
Platelets: 335 10*3/uL (ref 150–400)
RBC: 2.38 MIL/uL — ABNORMAL LOW (ref 4.22–5.81)
RDW: 15.8 % — ABNORMAL HIGH (ref 11.5–15.5)
WBC: 9 10*3/uL (ref 4.0–10.5)
nRBC: 0.7 % — ABNORMAL HIGH (ref 0.0–0.2)

## 2021-09-21 LAB — COMPREHENSIVE METABOLIC PANEL
ALT: 13 U/L (ref 0–44)
AST: 16 U/L (ref 15–41)
Albumin: 4.1 g/dL (ref 3.5–5.0)
Alkaline Phosphatase: 45 U/L (ref 38–126)
Anion gap: 9 (ref 5–15)
BUN: 23 mg/dL (ref 8–23)
CO2: 22 mmol/L (ref 22–32)
Calcium: 9.1 mg/dL (ref 8.9–10.3)
Chloride: 108 mmol/L (ref 98–111)
Creatinine, Ser: 1.21 mg/dL (ref 0.61–1.24)
GFR, Estimated: >60 mL/min (ref >60)
Glucose, Bld: 95 mg/dL (ref 70–99)
Potassium: 4.1 mmol/L (ref 3.5–5.1)
Sodium: 139 mmol/L (ref 135–145)
Total Bilirubin: 0.5 mg/dL (ref 0.3–1.2)
Total Protein: 7.7 g/dL (ref 6.5–8.1)

## 2021-09-21 LAB — IRON AND TIBC
Iron: 196 ug/dL — ABNORMAL HIGH (ref 45–182)
Saturation Ratios: 75 % — ABNORMAL HIGH (ref 17.9–39.5)
TIBC: 263 ug/dL (ref 250–450)
UIBC: 67 ug/dL

## 2021-09-21 LAB — FERRITIN: Ferritin: 170 ng/mL (ref 24–336)

## 2021-09-21 MED ORDER — GADOBUTROL 1 MMOL/ML IV SOLN
10.0000 mL | Freq: Once | INTRAVENOUS | Status: AC | PRN
  Administered 2021-09-21: 10 mL via INTRAVENOUS

## 2021-09-26 ENCOUNTER — Other Ambulatory Visit: Payer: Self-pay | Admitting: Cardiology

## 2021-09-28 ENCOUNTER — Inpatient Hospital Stay (HOSPITAL_COMMUNITY): Payer: Medicare PPO | Admitting: Hematology

## 2021-09-28 DIAGNOSIS — D539 Nutritional anemia, unspecified: Secondary | ICD-10-CM | POA: Diagnosis not present

## 2021-09-28 DIAGNOSIS — Z801 Family history of malignant neoplasm of trachea, bronchus and lung: Secondary | ICD-10-CM | POA: Diagnosis not present

## 2021-09-28 DIAGNOSIS — Z87891 Personal history of nicotine dependence: Secondary | ICD-10-CM | POA: Diagnosis not present

## 2021-09-28 DIAGNOSIS — Z808 Family history of malignant neoplasm of other organs or systems: Secondary | ICD-10-CM | POA: Diagnosis not present

## 2021-09-28 DIAGNOSIS — K769 Liver disease, unspecified: Secondary | ICD-10-CM | POA: Diagnosis not present

## 2021-09-28 NOTE — Progress Notes (Signed)
[facility] [address], [place] [address]   CLINIC:  Medical Oncology/Hematology  PCP:  Karie Schwalbe, MD [address] [place] / [place] [address]  [phone]  REASON FOR VISIT:  Follow-up for hereditary hemochromatosis  PRIOR THERAPY: none  CURRENT THERAPY: Deferasirox 720 mg daily  INTERVAL HISTORY:  [name], a 76 y.o. 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 Randell was last seen on 07/19/2021.  Today he reports feeling good. He reports occasional diarrhea occurring about once weekly. He denies nausea and vomiting.   REVIEW OF SYSTEMS:  Review of Systems  Constitutional:  Negative for appetite change and fatigue.  Gastrointestinal:  Positive for diarrhea. Negative for nausea and vomiting.  All other systems reviewed and are negative.  PAST MEDICAL/SURGICAL HISTORY:  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   Cerebrovascular disease    Moderate ASVD without focal stenosis in 01/2007   Colon polyps 3/12   single 2mm polyp--tubular adenoma   Diverticulosis 2012   found on colonoscopy   Erectile dysfunction    Family history of adverse reaction to anesthesia    difficult for son & sistor to wake    GERD (gastroesophageal reflux disease)    Hematochezia 05/24/2010   Hyperlipidemia    Osteoarthritis    knees/hands-Dr Lucey   Overweight(278.02)    Peripheral vascular disease (HCC) 03/11/2010   Moderate SFA stenosis; history of claudication   Pneumonia    Rosacea    Tobacco abuse, in remission    30-40 pack years discontinued in 1993   Vitamin B12 deficiency    Past Surgical History:  Procedure Laterality Date   APPENDECTOMY     COLONOSCOPY W/ POLYPECTOMY  2012   ILIAC VEIN ANGIOPLASTY / STENTING Right 08/09/2015   PERIPHERAL VASCULAR CATHETERIZATION Bilateral 06/21/2015    Procedure: Lower Extremity Angiography;  Surgeon: Runell Gess, MD;  Location: [facility] INVASIVE CV LAB;  Service: Cardiovascular;  Laterality: Bilateral;   PERIPHERAL VASCULAR CATHETERIZATION N/A 06/21/2015   Procedure: Abdominal Aortogram;  Surgeon: Runell Gess, MD;  Location: [facility] INVASIVE CV LAB;  Service: Cardiovascular;  Laterality: N/A;   PERIPHERAL VASCULAR CATHETERIZATION N/A 07/26/2015   Procedure: Lower Extremity Angiography;  Surgeon: Runell Gess, MD;  Location: [facility] INVASIVE CV LAB;  Service: Cardiovascular;  Laterality: N/A;   PERIPHERAL VASCULAR CATHETERIZATION N/A 08/09/2015   Procedure: Lower Extremity Angiography;  Surgeon: Runell Gess, MD;  Location: [facility] INVASIVE CV LAB;  Service: Cardiovascular;  Laterality: N/A;   PERIPHERAL VASCULAR CATHETERIZATION  08/09/2015   Procedure: Peripheral Vascular Intervention;  Surgeon: Runell Gess, MD;  Location: [facility] INVASIVE CV LAB;  Service: Cardiovascular;;  rt ext. iliac atherectomy and stent   PERIPHERAL VASCULAR CATHETERIZATION N/A 09/20/2015   Procedure: Lower Extremity Angiography;  Surgeon: Runell Gess, MD;  Location: [facility] INVASIVE CV LAB;  Service: Cardiovascular;  Laterality: N/A;   PERIPHERAL VASCULAR CATHETERIZATION Right 09/20/2015   Procedure: Peripheral Vascular Intervention;  Surgeon: Runell Gess, MD;  Location: [facility] INVASIVE CV LAB;  Service: Cardiovascular;  Laterality: Right;  SFA   ROTATOR CUFF REPAIR Left 7/15   Dr Sherlean Foot    SOCIAL HISTORY:  Social History   Socioeconomic History   Marital status: Married    Spouse name: Not on file   Number of children: 3   Years of education:  Not on file   Highest education level: Not on file  Occupational History   Occupation: Managed supply chain--- retired 2013    Comment: [employer]  Tobacco Use   Smoking status: Former    Packs/day: 1.00    Years: 30.00    Pack years: 30.00    Types: Cigarettes    Quit date: 05/16/1990    Years since  quitting: 31.3   Smokeless tobacco: Never   Tobacco comments:    Quit in 1993  Vaping Use   Vaping Use: Never used  Substance and Sexual Activity   Alcohol use: No    Alcohol/week: 0.0 standard drinks   Drug use: No   Sexual activity: Not on file  Other Topics Concern   Not on file  Social History Narrative   Has living will   Wife is health care POA---alternate is son or daughter   Would accept resuscitation but no prolonged artificial ventilation.   No tube feeds if cognitively unaware   Social Determinants of Health   Financial Resource Strain: Not on file  Food Insecurity: Not on file  Transportation Needs: Not on file  Physical Activity: Not on file  Stress: Not on file  Social Connections: Not on file  Intimate Partner Violence: Not on file    FAMILY HISTORY:  Family History  Problem Relation Age of Onset   Hypertension Father        And siblings   Heart disease Father        And second-degree relatives   Transient ischemic attack Father    Lung cancer Mother    Diabetes Mother    Brain cancer Sister    Diabetes Brother    Atrial fibrillation Brother    Heart disease Sister    Heart disease Sister    Thyroid disease Sister    Ulcers Son    Diabetes Son    Diabetes Son     CURRENT MEDICATIONS:  Current Outpatient Medications  Medication Sig Dispense Refill   Acetaminophen (TYLENOL) 325 MG CAPS Tylenol 325 mg capsule  Take 1 capsule 3 times a day by oral route.     aspirin 81 MG tablet Take 81 mg by mouth daily.     chlorthalidone (HYGROTON) 25 MG tablet TAKE 1/2 TABLET(12.5 MG) BY MOUTH DAILY 30 tablet 1   cimetidine (TAGAMET) 400 MG tablet Take 400 mg by mouth 2 (two) times daily as needed (indigestion).      clopidogrel (PLAVIX) 75 MG tablet TAKE 1 TABLET(75 MG) BY MOUTH DAILY WITH BREAKFAST 30 tablet 9   Cyanocobalamin (VITAMIN B-12) 500 MCG SUBL Place 500 mcg under the tongue daily.     Deferasirox 180 MG TABS TAKE 4 TABLETS BY MOUTH DAILY 120  tablet 2   losartan (COZAAR) 25 MG tablet TAKE ONE TABLET BY MOUTH EVERY DAY 90 tablet 3   metoprolol succinate (TOPROL-XL) 100 MG 24 hr tablet TAKE 1 TABLET(100 MG) BY MOUTH DAILY WITH OR IMMEDIATELY FOLLOWING A MEAL 90 tablet 3   nitroGLYCERIN (NITROSTAT) 0.4 MG SL tablet Take by mouth.     Omega-3 Fatty Acids (FISH OIL PO) Take 1,000 mg by mouth daily.     pravastatin (PRAVACHOL) 40 MG tablet TAKE 1 TABLET(40 MG) BY MOUTH DAILY 90 tablet 3   Probiotic Product (PROBIOTIC PO) Take by mouth.     traMADol (ULTRAM) 50 MG tablet TAKE 1 TABLET BY MOUTH EVERY 8 HOURS AS NEEDED 90 tablet 0   Vitamin D, Ergocalciferol, (  DRISDOL) 1.25 MG (50000 UNIT) CAPS capsule TAKE 1 CAPSULE BY MOUTH 1 TIME A WEEK 12 capsule 2   No current facility-administered medications for this visit.    ALLERGIES:  Allergies  Allergen Reactions   Diltiazem Hcl Hives and Rash   Cardizem [Diltiazem Hcl]     PHYSICAL EXAM:  Performance status (ECOG): 0 - Asymptomatic  Vitals:   09/28/21 1517  BP: (!) 142/58  Pulse: 83  Resp: 18  Temp: 97.9 F (36.6 C)  SpO2: 96%   Wt Readings from Last 3 Encounters:  09/28/21 205 lb 4 oz (93.1 kg)  09/01/21 209 lb 12.8 oz (95.2 kg)  07/19/21 206 lb 12.8 oz (93.8 kg)   Physical Exam Vitals reviewed.  Constitutional:      Appearance: Normal appearance. He is obese.  Cardiovascular:     Rate and Rhythm: Normal rate and regular rhythm.     Pulses: Normal pulses.     Heart sounds: Normal heart sounds.  Pulmonary:     Effort: Pulmonary effort is normal.     Breath sounds: Normal breath sounds.  Neurological:     General: No focal deficit present.     Mental Status: He is alert and oriented to person, place, and time.  Psychiatric:        Mood and Affect: Mood normal.        Behavior: Behavior normal.    LABORATORY DATA:  I have reviewed the labs as listed.     Latest Ref Rng & Units 09/21/2021    1:29 PM 07/19/2021    2:05 PM 05/18/2021    2:32 PM  CBC  WBC 4.0 -  10.5 K/uL 9.0   7.5   8.1    Hemoglobin 13.0 - 17.0 g/dL 9.2   9.3   9.5    Hematocrit 39.0 - 52.0 % 27.9   28.2   28.2    Platelets 150 - 400 K/uL 335   328   321        Latest Ref Rng & Units 09/21/2021    1:29 PM 07/19/2021    2:05 PM 05/18/2021    2:32 PM  CMP  Glucose 70 - 99 mg/dL 95   98   93    BUN 8 - 23 mg/dL 23   20   26     Creatinine 0.61 - 1.24 mg/dL 1.61   0.96   0.45    Sodium 135 - 145 mmol/L 139   134   135    Potassium 3.5 - 5.1 mmol/L 4.1   4.0   3.9    Chloride 98 - 111 mmol/L 108   100   103    CO2 22 - 32 mmol/L 22   23   24     Calcium 8.9 - 10.3 mg/dL 9.1   8.7   8.9    Total Protein 6.5 - 8.1 g/dL 7.7   7.5   7.6    Total Bilirubin 0.3 - 1.2 mg/dL 0.5   0.6   0.6    Alkaline Phos 38 - 126 U/L 45   43   40    AST 15 - 41 U/L 16   16   16     ALT 0 - 44 U/L 13   14   15         Component Value Date/Time   RBC 2.38 (L) 09/21/2021 1329   MCV 117.2 (H) 09/21/2021 1329   MCH 38.7 (H) 09/21/2021 1329  MCHC 33.0 09/21/2021 1329   RDW 15.8 (H) 09/21/2021 1329   LYMPHSABS 2.5 09/21/2021 1329   MONOABS 1.2 (H) 09/21/2021 1329   EOSABS 0.2 09/21/2021 1329   BASOSABS 0.1 09/21/2021 1329    DIAGNOSTIC IMAGING:  I have independently reviewed the scans and discussed with the patient. MR LIVER W WO CONTRAST  Addendum Date: 09/27/2021   ADDENDUM REPORT: 09/27/2021 13:13 ADDENDUM: Assessment with iron quantification sequences confirms the diminished liver iron concentration. Liver iron concentration less than 2 milligram/gram, slightly below the "mild" designation at 1.5 T at a mean value of 60 s-1, 65 being the cut off for "mild" liver iron. Previously well into the "moderate" range. Fat fraction also appears slightly diminished at between 5 and 7 percent. Previously between 7 and 12 percent. Electronically Signed   By: Donzetta Kohut M.D.   On: 09/27/2021 13:13   Result Date: 09/27/2021 CLINICAL DATA:  A 76 year old male presents for evaluation of hereditary  hemochromatosis, history of iron deposition in the liver. EXAM: MRI ABDOMEN WITHOUT AND WITH CONTRAST TECHNIQUE: Multiplanar multisequence MR imaging of the abdomen was performed both before and after the administration of intravenous contrast. CONTRAST:  10mL GADAVIST GADOBUTROL 1 MMOL/ML IV SOLN COMPARISON:  Comparison made with August of 2021. FINDINGS: Lower chest: Incidental imaging of the lung bases with basilar atelectasis, no gross effusion or consolidation, limited assessment on MRI. Hepatobiliary: Visibly less signal loss in the liver than on previous imaging. Iron quantification images not available at the current time for evaluation but liver signal is less dark on T2 and diffusion and displays no signal loss on in phase as compared to out of phase T1 weighted gradient echo imaging. No signs of liver nodularity. No suspicious hepatic lesion. Sludge in the gallbladder. No pericholecystic stranding or biliary duct distension. Pancreas: Normal intrinsic T1 signal. No ductal dilation or sign of inflammation. No focal lesion. Spleen:  Normal. Adrenals/Urinary Tract: Adrenal glands are normal. Kidneys without focal, suspicious renal lesion or hydronephrosis. Stomach/Bowel: Unremarkable to the extent evaluated on abdominal MRI. No acute findings. Vascular/Lymphatic: Signs of aortic atherosclerosis. Suspect calcified and noncalcified plaque at the origin of the SMA. Degree of narrowing difficult to quantify but appears narrowed on MRI, this appears moderately narrowed on MRI but this could be accentuated by presence of calcium which could lead to artifact and appears little changed compared to prior imaging as well as can be assessed. No adenopathy. Other:  No ascites. Musculoskeletal: No suspicious bone lesions identified. IMPRESSION: 1. No signal loss in the liver on in phase as compared to out of phase T1 weighted gradient echo imaging. This reflects marked improvement of iron deposition that was seen  previously. Iron quantification currently not available. If this was not performed the patient will be recalled for additional imaging and an addendum will be provided. 2. No signs of liver nodularity or suspicious hepatic lesion. 3. Sludge in the gallbladder. No pericholecystic stranding or biliary duct distension. 4. Suspect calcified and noncalcified plaque at the origin of the SMA. Degree of narrowing difficult to quantify but appears moderately narrowed on MRI, but this could be accentuated by presence of calcium which could lead to artifact and appears little changed compared to prior imaging as well as can be assessed. Consider dedicated evaluation with CT angiography particularly if there any symptoms that could be attributed to mesenteric angina. Celiac and SMA do appear patent. Electronically Signed: By: Donzetta Kohut M.D. On: 09/22/2021 15:34   ECHOCARDIOGRAM COMPLETE  Result Date:  09/16/2021    ECHOCARDIOGRAM REPORT   Patient Name:   Aaron Sexton Date of Exam: 09/16/2021 Medical Rec #:  [id number]       Height:       66.0 in Accession #:    [id number]      Weight:       209.8 lb Date of Birth:  Jul 29, 1945      BSA:          2.041 m Patient Age:    75 years        BP:           161/72 mmHg Patient Gender: M               HR:           80 bpm. [facility] Procedure: 2D Echo, Cardiac Doppler and Color Doppler Indications:    CAD  History:        Patient has no prior history of Echocardiogram examinations.                 CAD, Signs/Symptoms:Edema; Risk Factors:Dyslipidemia.  Sonographer:    Mikki Harbor Referring Phys: [id number] Ellsworth Lennox IMPRESSIONS  1. Left ventricular ejection fraction, by estimation, is 65 to 70%. The left ventricle has normal function. The left ventricle has no regional wall motion abnormalities. There is mild left ventricular hypertrophy. Left ventricular diastolic parameters are consistent with Grade I diastolic dysfunction (impaired relaxation).  2.  Right ventricular systolic function is normal. The right ventricular size is normal.  3. The mitral valve is normal in structure. No evidence of mitral valve regurgitation. No evidence of mitral stenosis.  4. The aortic valve is tricuspid. There is mild calcification of the aortic valve. There is mild thickening of the aortic valve. Aortic valve regurgitation is not visualized. Aortic valve sclerosis is present, with no evidence of aortic valve stenosis.  5. The inferior vena cava is normal in size with greater than 50% respiratory variability, suggesting right atrial pressure of 3 mmHg. FINDINGS  Left Ventricle: Left ventricular ejection fraction, by estimation, is 65 to 70%. The left ventricle has normal function. The left ventricle has no regional wall motion abnormalities. The left ventricular internal cavity size was normal in size. There is  mild left ventricular hypertrophy. Left ventricular diastolic parameters are consistent with Grade I diastolic dysfunction (impaired relaxation). Right Ventricle: The right ventricular size is normal. No increase in right ventricular wall thickness. Right ventricular systolic function is normal. Left Atrium: Left atrial size was normal in size. Right Atrium: Right atrial size was normal in size. Pericardium: There is no evidence of pericardial effusion. Mitral Valve: The mitral valve is normal in structure. No evidence of mitral valve regurgitation. No evidence of mitral valve stenosis. MV peak gradient, 4.9 mmHg. The mean mitral valve gradient is 2.0 mmHg. Tricuspid Valve: The tricuspid valve is normal in structure. Tricuspid valve regurgitation is trivial. No evidence of tricuspid stenosis. Aortic Valve: The aortic valve is tricuspid. There is mild calcification of the aortic valve. There is mild thickening of the aortic valve. Aortic valve regurgitation is not visualized. Aortic valve sclerosis is present, with no evidence of aortic valve stenosis. Aortic valve mean  gradient measures 5.0 mmHg. Aortic valve peak gradient measures 13.2 mmHg. Aortic valve area, by VTI measures 2.95 cm. Pulmonic Valve: The pulmonic valve was normal in structure. Pulmonic valve regurgitation is not visualized. No evidence of pulmonic stenosis. Aorta: The aortic root is  normal in size and structure. Venous: The inferior vena cava is normal in size with greater than 50% respiratory variability, suggesting right atrial pressure of 3 mmHg. IAS/Shunts: No atrial level shunt detected by color flow Doppler.  LEFT VENTRICLE PLAX 2D LVIDd:         2.75 cm     Diastology LVIDs:         3.50 cm     LV e' medial:    5.98 cm/s LV PW:         1.40 cm     LV E/e' medial:  12.7 LV IVS:        1.25 cm     LV e' lateral:   8.38 cm/s LVOT diam:     2.00 cm     LV E/e' lateral: 9.0 LV SV:         108 LV SV Index:   53 LVOT Area:     3.14 cm  LV Volumes (MOD) LV vol d, MOD A2C: 58.5 ml LV vol d, MOD A4C: 58.2 ml LV vol s, MOD A2C: 26.9 ml LV vol s, MOD A4C: 24.8 ml LV SV MOD A2C:     31.6 ml LV SV MOD A4C:     58.2 ml LV SV MOD BP:      33.3 ml RIGHT VENTRICLE RV Basal diam:  3.70 cm RV Mid diam:    2.70 cm RV S prime:     14.70 cm/s TAPSE (M-mode): 2.5 cm LEFT ATRIUM             Index        RIGHT ATRIUM           Index LA diam:        3.80 cm 1.86 cm/m   RA Area:     15.90 cm LA Vol (A2C):   59.3 ml 29.06 ml/m  RA Volume:   41.80 ml  20.48 ml/m LA Vol (A4C):   53.0 ml 25.97 ml/m LA Biplane Vol: 57.6 ml 28.22 ml/m  AORTIC VALVE                     PULMONIC VALVE AV Area (Vmax):    2.69 cm      PV Vmax:       0.90 m/s AV Area (Vmean):   2.79 cm      PV Peak grad:  3.2 mmHg AV Area (VTI):     2.95 cm AV Vmax:           182.00 cm/s AV Vmean:          102.000 cm/s AV VTI:            0.365 m AV Peak Grad:      13.2 mmHg AV Mean Grad:      5.0 mmHg LVOT Vmax:         156.00 cm/s LVOT Vmean:        90.500 cm/s LVOT VTI:          0.343 m LVOT/AV VTI ratio: 0.94  AORTA Ao Root diam: 3.30 cm Ao Asc diam:  3.80 cm  MITRAL VALVE MV Area (PHT): 3.33 cm    SHUNTS MV Area VTI:   3.73 cm    Systemic VTI:  0.34 m MV Peak grad:  4.9 mmHg    Systemic Diam: 2.00 cm MV Mean grad:  2.0 mmHg MV Vmax:       1.11 m/s MV Vmean:  67.7 cm/s MV Decel Time: 228 msec MV E velocity: 75.80 cm/s MV A velocity: 86.10 cm/s MV E/A ratio:  0.88 Donato Schultz MD Electronically signed by Donato Schultz MD Signature Date/Time: 09/16/2021/3:53:58 PM    Final      ASSESSMENT:  1.  Hereditary hemochromatosis: -Patient seen for elevated ferritin levels. -EMR evaluation shows hemochromatosis testing on 01/27/2019 which showed heterozygosity for C282Y and H63D variants. -Given the elevated ferritin levels above 1000 and the results of genetic testing, this is compatible with hereditary hemochromatosis. -Patient does have arthritis of the small joints of the hands and arthritis of the back. -No history of CHF but has CAD.  No diabetes. -MRI of the liver on 12/11/2019 shows estimated liver iron concentration approximately 7.7 mg/g compatible with moderate liver iron.  Estimated fat fraction ranges from 7-12%, more pronounced in the right lobe compared to left hepatic lobe.  No focal hepatic lesions seen.  No morphological changes of significant liver disease. -Jadenu 540 mg daily started on 12/24/2019, dose increased to 720 mg daily on 02/09/2020.   2.  Macrocytic anemia: -Colonoscopy by Dr. Myrtie Neither on 02/17/2019 shows diverticulosis in the left colon, 12 mm polyp in the cecum, 4 to 6 mm polyp in the ascending colon and 5 mm polyp in the descending colon.  Pathology was consistent with tubular adenomas.   PLAN:  1.  Hereditary hemochromatosis: - He is taking Jadenu 720 mg in the mornings on an empty stomach. - Reports occasional diarrhea from it.  Maybe once a week. - Reviewed labs today which showed improvement in ferritin to 170.  Percent saturation is 75.  LFTs are normal.  Creatinine was normal.  Hemoglobin was 9.2 with MCV 117. - MRI of the  liver with and without contrast on 09/21/2021: Diminished liver iron concentration less than 2 mg/gram, slightly below the "mild" designation (previously well into the "moderate" range).  Fat fraction also slightly diminished. - I would not recommend increased dose of Jadenu because of difficulty with tolerance.  We will continue same dose at this time.  RTC 3 months for follow-up with repeat labs.   2.  Macrocytic anemia: - Previous nutritional deficiency work-up was negative. - Anemia from myelosuppression from Jadenu.   3.  Vitamin D deficiency: - Continue vitamin D 50,000 units weekly.  Orders placed this encounter:  No orders of the defined types were placed in this encounter.    Doreatha Massed, MD [facility] [phone]   I, Alda Ponder, am acting as a scribe for Dr. Doreatha Massed.  I, Doreatha Massed MD, have reviewed the above documentation for accuracy and completeness, and I agree with the above.

## 2021-09-28 NOTE — Patient Instructions (Signed)
[place] at [facility] Discharge Instructions  You were seen and examined today by Dr. Delton Coombes.  Dr. Delton Coombes discussed your most recent lab work and MRI scan which revealed that everything looks good.  Follow-up as scheduled in 3 months.    Thank you for choosing [place] at [facility] [place] to provide your oncology and hematology care.  To afford each patient quality time with our provider, please arrive at least 15 minutes before your scheduled appointment time.   If you have a lab appointment with the [place] please come in thru the Main Entrance and check in at the main information desk.  You need to re-schedule your appointment should you arrive 10 or more minutes late.  We strive to give you quality time with our providers, and arriving late affects you and other patients whose appointments are after yours.  Also, if you no show three or more times for appointments you may be dismissed from the clinic at the providers discretion.     Again, thank you for choosing [facility].  Our hope is that these requests will decrease the amount of time that you wait before being seen by our physicians.       _____________________________________________________________  Should you have questions after your visit to [facility], please contact our office at [phone] and follow the prompts.  Our office hours are 8:00 a.m. and 4:30 p.m. Monday - Friday.  Please note that voicemails left after 4:00 p.m. may not be returned until the following business day.  We are closed weekends and major holidays.  You do have access to a nurse 24-7, just call the main number to the clinic [phone] and do not press any options, hold on the line and a nurse will answer the phone.    For prescription refill requests, have your pharmacy contact our office and allow 72 hours.    Due to Covid, you will need to wear a mask upon  entering the hospital. If you do not have a mask, a mask will be given to you at the Main Entrance upon arrival. For doctor visits, patients may have 1 support person age 64 or older with them. For treatment visits, patients can not have anyone with them due to social distancing guidelines and our immunocompromised population.

## 2021-09-29 ENCOUNTER — Ambulatory Visit (HOSPITAL_BASED_OUTPATIENT_CLINIC_OR_DEPARTMENT_OTHER)
Admission: RE | Admit: 2021-09-29 | Discharge: 2021-09-29 | Disposition: A | Discharge status: Home / Self Care | Payer: Medicare PPO | Source: Ambulatory Visit | Attending: Cardiovascular Disease | Admitting: Cardiovascular Disease

## 2021-09-29 ENCOUNTER — Ambulatory Visit (HOSPITAL_COMMUNITY)
Admission: RE | Admit: 2021-09-29 | Discharge: 2021-09-29 | Disposition: A | Discharge status: Home / Self Care | Payer: Medicare PPO | Source: Ambulatory Visit | Attending: Cardiovascular Disease | Admitting: Cardiovascular Disease

## 2021-09-29 DIAGNOSIS — Z95820 Peripheral vascular angioplasty status with implants and grafts: Secondary | ICD-10-CM

## 2021-09-29 DIAGNOSIS — Z95828 Presence of other vascular implants and grafts: Secondary | ICD-10-CM | POA: Diagnosis not present

## 2021-09-29 DIAGNOSIS — I739 Peripheral vascular disease, unspecified: Secondary | ICD-10-CM

## 2021-09-29 DIAGNOSIS — I6523 Occlusion and stenosis of bilateral carotid arteries: Secondary | ICD-10-CM | POA: Diagnosis not present

## 2021-10-04 ENCOUNTER — Other Ambulatory Visit: Payer: Self-pay | Admitting: Internal Medicine

## 2021-10-04 ENCOUNTER — Other Ambulatory Visit (HOSPITAL_COMMUNITY): Payer: Self-pay

## 2021-10-04 NOTE — Telephone Encounter (Signed)
Last filled 09-05-21 #90 Last OV 12-10-20 Next OV 12-14-21 [facility]

## 2021-10-12 ENCOUNTER — Encounter: Payer: Self-pay | Admitting: Cardiovascular Disease

## 2021-10-12 ENCOUNTER — Ambulatory Visit: Payer: Medicare PPO | Admitting: Cardiovascular Disease

## 2021-10-12 ENCOUNTER — Other Ambulatory Visit: Payer: Self-pay

## 2021-10-12 DIAGNOSIS — I739 Peripheral vascular disease, unspecified: Secondary | ICD-10-CM | POA: Diagnosis not present

## 2021-10-12 MED ORDER — NITROGLYCERIN 0.4 MG SL SUBL: 0.4000 mg | SUBLINGUAL_TABLET | SUBLINGUAL | 1 refills | Status: DC | PRN

## 2021-10-12 MED ORDER — SODIUM CHLORIDE 0.9% FLUSH: 3.0000 mL | Freq: Two times a day (BID) | INTRAVENOUS | Status: DC

## 2021-10-12 NOTE — Progress Notes (Signed)
10/12/2021 KYRION MORLES   03/21/1946  [id number]  Primary Physician Karie Schwalbe, MD Primary Cardiologist: Runell Gess MD Milagros Loll, [place], [place]  HPI:  FLORENTINO DANTIN is a 76 y.o.    mildly overweight married Caucasian male father of 3, grandfather and 3 grandchildren who is accompanied by his wife and that today.  He was initially referred by [name] registered nurse practitioner for evaluation of peripheral arterial disease. His cardiologist is Dr. Dina Rich. i last saw him in the 12/16/2019.[name] He has a history of hypertension and hyperlipidemia. I performed angioplasty of his right coronary artery back in 1994 and he's been a symptomatically since. He stopped smoking at that time. 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 He complains of worsening left greater than right lower extremity claudication. Dopplers performed at [facility] 09/11/13 revealed a right ABI 0.68 and a left ABI 0.59. There was a high-frequency signal in the right external iliac artery. Angiography recently performed showed a high-grade calcific//exophytic plaque in the right external iliac artery with a 30 mm gradient. 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 I failed to cross the highly calcified left SFA CTO however.I attempted right SFA intervention 09/20/15 unsuccessfully He does have mild lifestyle limiting claudication but this is not severe.    I did attempt percutaneous revascularization of his highly calcified bilateral SFA CTO's unsuccessfully.  He failed Pletal therapy.  I was initially going to refer him to Dr. Hoy Finlay at [facility] but he never pursued this.  He has had progressive claudication which is now lifestyle disabling.  Recent Dopplers suggested a patent right extrailiac artery stent with progression of disease in his  left extrailiac artery.  He wishes to proceed with reangiogram and potential endovascular therapy.     Current Meds  Medication Sig   Acetaminophen (TYLENOL) 325 MG CAPS Tylenol 325 mg capsule  Take 1 capsule 3 times a day by oral route.   aspirin 81 MG tablet Take 81 mg by mouth daily.   chlorthalidone (HYGROTON) 25 MG tablet TAKE 1/2 TABLET(12.5 MG) BY MOUTH DAILY   cimetidine (TAGAMET) 400 MG tablet Take 400 mg by mouth 2 (two) times daily as needed (indigestion).    clopidogrel (PLAVIX) 75 MG tablet TAKE 1 TABLET(75 MG) BY MOUTH DAILY WITH BREAKFAST   Cyanocobalamin (VITAMIN B-12) 500 MCG SUBL Place 500 mcg under the tongue daily.   Deferasirox 180 MG TABS TAKE 4 TABLETS BY MOUTH DAILY   losartan (COZAAR) 25 MG tablet TAKE ONE TABLET BY MOUTH EVERY DAY   metoprolol succinate (TOPROL-XL) 100 MG 24 hr tablet TAKE 1 TABLET(100 MG) BY MOUTH DAILY WITH OR IMMEDIATELY FOLLOWING A MEAL   nitroGLYCERIN (NITROSTAT) 0.4 MG SL tablet Take by mouth.   Omega-3 Fatty Acids (FISH OIL PO) Take 1,000 mg by mouth daily.   pravastatin (PRAVACHOL) 40 MG tablet TAKE 1 TABLET(40 MG) BY MOUTH DAILY   Probiotic Product (PROBIOTIC PO) Take by mouth.   traMADol (ULTRAM) 50 MG tablet TAKE 1 TABLET BY MOUTH EVERY 8 HOURS AS NEEDED   Vitamin D, Ergocalciferol, (DRISDOL) 1.25 MG (50000 UNIT) CAPS capsule TAKE 1 CAPSULE BY MOUTH 1 TIME A WEEK     Allergies  Allergen Reactions   Diltiazem Hcl Hives and Rash   Cardizem [Diltiazem Hcl]     Social History   Socioeconomic  History   Marital status: Married    Spouse name: Not on file   Number of children: 3   Years of education: Not on file   Highest education level: Not on file  Occupational History   Occupation: Managed supply chain--- retired 2013    Comment: [employer]  Tobacco Use   Smoking status: Former    Packs/day: 1.00    Years: 30.00    Pack years: 30.00    Types: Cigarettes    Quit date: 05/16/1990    Years since quitting:  31.4   Smokeless tobacco: Never   Tobacco comments:    Quit in 1993  Vaping Use   Vaping Use: Never used  Substance and Sexual Activity   Alcohol use: No    Alcohol/week: 0.0 standard drinks   Drug use: No   Sexual activity: Not on file  Other Topics Concern   Not on file  Social History Narrative   Has living will   Wife is health care POA---alternate is son or daughter   Would accept resuscitation but no prolonged artificial ventilation.   No tube feeds if cognitively unaware   Social Determinants of Health   Financial Resource Strain: Not on file  Food Insecurity: Not on file  Transportation Needs: Not on file  Physical Activity: Not on file  Stress: Not on file  Social Connections: Not on file  Intimate Partner Violence: Not on file     Review of Systems: General: negative for chills, fever, night sweats or weight changes.  Cardiovascular: negative for chest pain, dyspnea on exertion, edema, orthopnea, palpitations, paroxysmal nocturnal dyspnea or shortness of breath Dermatological: negative for rash Respiratory: negative for cough or wheezing Urologic: negative for hematuria Abdominal: negative for nausea, vomiting, diarrhea, bright red blood per rectum, melena, or hematemesis Neurologic: negative for visual changes, syncope, or dizziness All other systems reviewed and are otherwise negative except as noted above.    Blood pressure 139/63, pulse 72, height 5\' 7"  (1.702 m), weight 206 lb 3.2 oz (93.5 kg), SpO2 97 %.  General appearance: alert and no distress Neck: no adenopathy, no carotid bruit, no JVD, supple, symmetrical, trachea midline, and thyroid not enlarged, symmetric, no tenderness/mass/nodules Lungs: clear to auscultation bilaterally Heart: regular rate and rhythm, S1, S2 normal, no murmur, click, rub or gallop Extremities: extremities normal, atraumatic, no cyanosis or edema Pulses: Absent pedal pulses Skin: Skin color, texture, turgor normal. No  rashes or lesions Neurologic: Grossly normal  EKG not performed today  ASSESSMENT AND PLAN:   Claudication [facility]) Mr. Japheth returns for follow-up of his PAD.  I last saw him 2 years ago.  He is a cardiology patient of Dr. Dina Rich is.  I performed orbital atherectomy, PTA and stenting of the calcified right external iliac artery/3/17.  I attempted to cross his right and left SFA calcified CTO's unsuccessfully and was going to refer him to Dr. Hoy Finlay but he never entertain this.  He did fail Pletal therapy.  His most recent Doppler suggest a patent right extrailiac artery stent with progression of disease in his left extrailiac artery.  He is fairly incapacitated functionally with regards to his legs and wishes to proceed with reattempted angiography and percutaneous intervention.     Runell Gess MD FACP,FACC,FAHA, [facility] 10/12/2021 4:09 PM

## 2021-10-12 NOTE — Patient Instructions (Signed)
Medication Instructions:  Your physician recommends that you continue on your current medications as directed. Please refer to the Current Medication list given to you today.  *If you need a refill on your cardiac medications before your next appointment, please call your pharmacy*   Lab Work: Your physician recommends that you have labs drawn today: BMET & CBC  If you have labs (blood work) drawn today and your tests are completely normal, you will receive your results only by: [place] (if you have MyChart) OR A paper copy in the mail If you have any lab test that is abnormal or we need to change your treatment, we will call you to review the results.   Testing/Procedures: Dr. Gwenlyn Found has recommended that you have an Ultrasound of your AORTA/IVC/ILIACS.   To prepare for this test:  No food after 11PM the night before. Water is OK. (Don't drink liquids if you have been instructed not to for ANOTHER test).  Avoid foods that produce bowel gas, for 24 hours prior to exam (see below). No breakfast, no chewing gum, no smoking or carbonated beverages. Patient may take morning medications with water. Come in for test at least 15 minutes early to register.  Your physician has requested that you have an ankle brachial index (ABI). During this test an ultrasound and blood pressure cuff are used to evaluate the arteries that supply the arms and legs with blood. Allow thirty minutes for this exam. There are no restrictions or special instructions. To be done 1-2 weeks after PV procedure (6/29). These procedures will be done at [address]    Follow-Up: At [facility], you and your health needs are our priority.  As part of our continuing mission to provide you with exceptional heart care, we have created designated Provider Care Teams.  These Care Teams include your primary Cardiologist (physician) and Advanced Practice Providers (APPs -  Physician Assistants and Nurse  Practitioners) who all work together to provide you with the care you need, when you need it.  We recommend signing up for the patient portal called "MyChart".  Sign up information is provided on this After Visit Summary.  MyChart is used to connect with patients for Virtual Visits (Telemedicine).  Patients are able to view lab/test results, encounter notes, upcoming appointments, etc.  Non-urgent messages can be sent to your provider as well.   To learn more about what you can do with MyChart, go to [email or web address].    Your next appointment:   2-3 week(s) after procedure  The format for your next appointment:   In Person  Provider:   [name], MD   Other Instructions  [address] [place] [address] Dept: [phone] Loc: [place]  10/12/2021  You are scheduled for a Peripheral Angiogram on Thursday, June 29 with Dr. [name].  1. Please arrive at the Main Entrance A at [facility]: [place], [place] [address] at 7:30 AM (This time is two hours before your procedure to ensure your preparation). Free valet parking service is available.   Special note: Every effort is made to have your procedure done on time. Please understand that emergencies sometimes delay scheduled procedures.  2. Diet: Do not eat solid foods after midnight.  You may have clear liquids until 5 AM upon the day of the procedure.  3. Labs: You will need to have  blood drawn today.  4. Medication instructions in preparation for your procedure:  On the morning of your procedure, take Aspirin and Plavix/Clopidogrel and any morning medicines NOT listed above.  You may use sips of water.  5. Plan to go home the same day, you will only stay overnight if medically necessary. 6. You MUST have a responsible adult to drive you home. 7. An adult MUST be with  you the first 24 hours after you arrive home. 8. Bring a current list of your medications, and the last time and date medication taken. 9. Bring ID and current insurance cards. 10.Please wear clothes that are easy to get on and off and wear slip-on shoes.  Thank you for allowing [place] to care for you!   -- [facility]

## 2021-10-12 NOTE — H&P (View-Only) (Signed)
10/12/2021 Aaron Sexton   03/21/1946  [id number]  Primary Physician Karie Schwalbe, MD Primary Cardiologist: Runell Gess MD Milagros Loll, [place], [place]  HPI:  Aaron Sexton is a 76 y.o.    mildly overweight married Caucasian male father of 3, grandfather and 3 grandchildren who is accompanied by his wife and that today.  He was initially referred by [name] registered nurse practitioner for evaluation of peripheral arterial disease. His cardiologist is Dr. Dina Rich. i last saw him in the 12/16/2019.[name] He has a history of hypertension and hyperlipidemia. I performed angioplasty of his right coronary artery back in 1994 and he's been a symptomatically since. He stopped smoking at that time. 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 He complains of worsening left greater than right lower extremity claudication. Dopplers performed at [facility] 09/11/13 revealed a right ABI 0.68 and a left ABI 0.59. There was a high-frequency signal in the right external iliac artery. Angiography recently performed showed a high-grade calcific//exophytic plaque in the right external iliac artery with a 30 mm gradient. 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 I failed to cross the highly calcified left SFA CTO however.I attempted right SFA intervention 09/20/15 unsuccessfully He does have mild lifestyle limiting claudication but this is not severe.    I did attempt percutaneous revascularization of his highly calcified bilateral SFA CTO's unsuccessfully.  He failed Pletal therapy.  I was initially going to refer him to Dr. Hoy Finlay at [facility] but he never pursued this.  He has had progressive claudication which is now lifestyle disabling.  Recent Dopplers suggested a patent right extrailiac artery stent with progression of disease in his  left extrailiac artery.  He wishes to proceed with reangiogram and potential endovascular therapy.     Current Meds  Medication Sig   Acetaminophen (TYLENOL) 325 MG CAPS Tylenol 325 mg capsule  Take 1 capsule 3 times a day by oral route.   aspirin 81 MG tablet Take 81 mg by mouth daily.   chlorthalidone (HYGROTON) 25 MG tablet TAKE 1/2 TABLET(12.5 MG) BY MOUTH DAILY   cimetidine (TAGAMET) 400 MG tablet Take 400 mg by mouth 2 (two) times daily as needed (indigestion).    clopidogrel (PLAVIX) 75 MG tablet TAKE 1 TABLET(75 MG) BY MOUTH DAILY WITH BREAKFAST   Cyanocobalamin (VITAMIN B-12) 500 MCG SUBL Place 500 mcg under the tongue daily.   Deferasirox 180 MG TABS TAKE 4 TABLETS BY MOUTH DAILY   losartan (COZAAR) 25 MG tablet TAKE ONE TABLET BY MOUTH EVERY DAY   metoprolol succinate (TOPROL-XL) 100 MG 24 hr tablet TAKE 1 TABLET(100 MG) BY MOUTH DAILY WITH OR IMMEDIATELY FOLLOWING A MEAL   nitroGLYCERIN (NITROSTAT) 0.4 MG SL tablet Take by mouth.   Omega-3 Fatty Acids (FISH OIL PO) Take 1,000 mg by mouth daily.   pravastatin (PRAVACHOL) 40 MG tablet TAKE 1 TABLET(40 MG) BY MOUTH DAILY   Probiotic Product (PROBIOTIC PO) Take by mouth.   traMADol (ULTRAM) 50 MG tablet TAKE 1 TABLET BY MOUTH EVERY 8 HOURS AS NEEDED   Vitamin D, Ergocalciferol, (DRISDOL) 1.25 MG (50000 UNIT) CAPS capsule TAKE 1 CAPSULE BY MOUTH 1 TIME A WEEK     Allergies  Allergen Reactions   Diltiazem Hcl Hives and Rash   Cardizem [Diltiazem Hcl]     Social History   Socioeconomic  History   Marital status: Married    Spouse name: Not on file   Number of children: 3   Years of education: Not on file   Highest education level: Not on file  Occupational History   Occupation: Managed supply chain--- retired 2013    Comment: [employer]  Tobacco Use   Smoking status: Former    Packs/day: 1.00    Years: 30.00    Pack years: 30.00    Types: Cigarettes    Quit date: 05/16/1990    Years since quitting:  31.4   Smokeless tobacco: Never   Tobacco comments:    Quit in 1993  Vaping Use   Vaping Use: Never used  Substance and Sexual Activity   Alcohol use: No    Alcohol/week: 0.0 standard drinks   Drug use: No   Sexual activity: Not on file  Other Topics Concern   Not on file  Social History Narrative   Has living will   Wife is health care POA---alternate is son or daughter   Would accept resuscitation but no prolonged artificial ventilation.   No tube feeds if cognitively unaware   Social Determinants of Health   Financial Resource Strain: Not on file  Food Insecurity: Not on file  Transportation Needs: Not on file  Physical Activity: Not on file  Stress: Not on file  Social Connections: Not on file  Intimate Partner Violence: Not on file     Review of Systems: General: negative for chills, fever, night sweats or weight changes.  Cardiovascular: negative for chest pain, dyspnea on exertion, edema, orthopnea, palpitations, paroxysmal nocturnal dyspnea or shortness of breath Dermatological: negative for rash Respiratory: negative for cough or wheezing Urologic: negative for hematuria Abdominal: negative for nausea, vomiting, diarrhea, bright red blood per rectum, melena, or hematemesis Neurologic: negative for visual changes, syncope, or dizziness All other systems reviewed and are otherwise negative except as noted above.    Blood pressure 139/63, pulse 72, height 5\' 7"  (1.702 m), weight 206 lb 3.2 oz (93.5 kg), SpO2 97 %.  General appearance: alert and no distress Neck: no adenopathy, no carotid bruit, no JVD, supple, symmetrical, trachea midline, and thyroid not enlarged, symmetric, no tenderness/mass/nodules Lungs: clear to auscultation bilaterally Heart: regular rate and rhythm, S1, S2 normal, no murmur, click, rub or gallop Extremities: extremities normal, atraumatic, no cyanosis or edema Pulses: Absent pedal pulses Skin: Skin color, texture, turgor normal. No  rashes or lesions Neurologic: Grossly normal  EKG not performed today  ASSESSMENT AND PLAN:   Claudication [facility]) [name] returns for follow-up of his PAD.  I last saw him 2 years ago.  He is a cardiology patient of Dr. Dina Rich is.  I performed orbital atherectomy, PTA and stenting of the calcified right external iliac artery/3/17.  I attempted to cross his right and left SFA calcified CTO's unsuccessfully and was going to refer him to Dr. Hoy Finlay but he never entertain this.  He did fail Pletal therapy.  His most recent Doppler suggest a patent right extrailiac artery stent with progression of disease in his left extrailiac artery.  He is fairly incapacitated functionally with regards to his legs and wishes to proceed with reattempted angiography and percutaneous intervention.     Runell Gess MD FACP,FACC,FAHA, [facility] 10/12/2021 4:09 PM

## 2021-10-12 NOTE — Assessment & Plan Note (Signed)
[name] returns for follow-up of his PAD.  I last saw him 2 years ago.  He is a cardiology patient of Dr. Carlyle Dolly is.  I performed orbital atherectomy, PTA and stenting of the calcified right external iliac artery/3/17.  I attempted to cross his right and left SFA calcified CTO's unsuccessfully and was going to refer him to Dr. [name] but he never entertain this.  He did fail Pletal therapy.  His most recent Doppler suggest a patent right extrailiac artery stent with progression of disease in his left extrailiac artery.  He is fairly incapacitated functionally with regards to his legs and wishes to proceed with reattempted angiography and percutaneous intervention.

## 2021-10-13 ENCOUNTER — Other Ambulatory Visit (HOSPITAL_COMMUNITY): Payer: Self-pay

## 2021-10-13 LAB — BASIC METABOLIC PANEL
BUN/Creatinine Ratio: 15 (ref 10–24)
BUN: 17 mg/dL (ref 8–27)
CO2: 20 mmol/L (ref 20–29)
Calcium: 9.2 mg/dL (ref 8.6–10.2)
Chloride: 103 mmol/L (ref 96–106)
Creatinine, Ser: 1.17 mg/dL (ref 0.76–1.27)
Glucose: 89 mg/dL (ref 70–99)
Potassium: 4.1 mmol/L (ref 3.5–5.2)
Sodium: 139 mmol/L (ref 134–144)
eGFR: 64 mL/min/{1.73_m2} (ref >59)

## 2021-10-13 LAB — CBC
Hematocrit: 26.4 % — ABNORMAL LOW (ref 37.5–51.0)
Hemoglobin: 9.4 g/dL — ABNORMAL LOW (ref 13.0–17.7)
MCH: 39 pg — ABNORMAL HIGH (ref 26.6–33.0)
MCHC: 35.6 g/dL (ref 31.5–35.7)
MCV: 110 fL — ABNORMAL HIGH (ref 79–97)
NRBC: 1 % — ABNORMAL HIGH (ref 0–0)
Platelets: 327 10*3/uL (ref 150–450)
RBC: 2.41 x10E6/uL — CL (ref 4.14–5.80)
RDW: 13.2 % (ref 11.6–15.4)
WBC: 9.9 10*3/uL (ref 3.4–10.8)

## 2021-10-24 ENCOUNTER — Telehealth: Payer: Self-pay | Admitting: Cardiovascular Disease

## 2021-10-24 NOTE — Telephone Encounter (Signed)
*  STAT* If patient is at the pharmacy, call can be transferred to refill team.   1. Which medications need to be refilled? (please list name of each medication and dose if known)  chlorthalidone (HYGROTON) 25 MG tablet  2. Which pharmacy/location (including street and city if local pharmacy) is medication to be sent to?[facility] [place], [place]  3. Do they need a 30 day or 90 day supply? 90 day supply

## 2021-10-25 MED ORDER — CHLORTHALIDONE 25 MG PO TABS: ORAL_TABLET | ORAL | 3 refills | Status: DC

## 2021-11-01 ENCOUNTER — Telehealth: Payer: Self-pay | Admitting: *Deleted

## 2021-11-02 ENCOUNTER — Other Ambulatory Visit (HOSPITAL_COMMUNITY): Payer: Self-pay | Admitting: Hematology

## 2021-11-02 ENCOUNTER — Other Ambulatory Visit (HOSPITAL_COMMUNITY): Payer: Self-pay

## 2021-11-02 MED ORDER — DEFERASIROX 180 MG PO TABS
ORAL_TABLET | ORAL | 2 refills | Status: DC
  Filled 2021-11-02: qty 120, 30d supply, fill #0
  Filled 2021-11-30: qty 120, 30d supply, fill #1
  Filled 2021-12-30 – 2022-01-17 (×2): qty 120, 30d supply, fill #2

## 2021-11-03 ENCOUNTER — Ambulatory Visit (HOSPITAL_COMMUNITY)
Admission: RE | Admit: 2021-11-03 | Discharge: 2021-11-03 | Disposition: A | Discharge status: Home / Self Care | Payer: Medicare PPO | Attending: Cardiovascular Disease | Admitting: Cardiovascular Disease

## 2021-11-03 ENCOUNTER — Other Ambulatory Visit: Payer: Self-pay

## 2021-11-03 ENCOUNTER — Encounter (HOSPITAL_COMMUNITY)
Admission: RE | Disposition: A | Discharge status: Home / Self Care | Payer: Self-pay | Source: Home / Self Care | Attending: Cardiovascular Disease

## 2021-11-03 DIAGNOSIS — E785 Hyperlipidemia, unspecified: Secondary | ICD-10-CM | POA: Diagnosis not present

## 2021-11-03 DIAGNOSIS — Z87891 Personal history of nicotine dependence: Secondary | ICD-10-CM | POA: Diagnosis not present

## 2021-11-03 DIAGNOSIS — I10 Essential (primary) hypertension: Secondary | ICD-10-CM | POA: Insufficient documentation

## 2021-11-03 DIAGNOSIS — I70213 Atherosclerosis of native arteries of extremities with intermittent claudication, bilateral legs: Secondary | ICD-10-CM | POA: Diagnosis not present

## 2021-11-03 DIAGNOSIS — I739 Peripheral vascular disease, unspecified: Secondary | ICD-10-CM | POA: Insufficient documentation

## 2021-11-03 HISTORY — PX: LOWER EXTREMITY ANGIOGRAPHY: CATH118251

## 2021-11-03 SURGERY — LOWER EXTREMITY ANGIOGRAPHY
Anesthesia: LOCAL | Laterality: Bilateral

## 2021-11-03 MED ORDER — SODIUM CHLORIDE 0.9 % IV SOLN: 250.0000 mL | INTRAVENOUS | Status: DC | PRN

## 2021-11-03 MED ORDER — ONDANSETRON HCL 4 MG/2ML IJ SOLN: 4.0000 mg | Freq: Four times a day (QID) | INTRAMUSCULAR | Status: DC | PRN

## 2021-11-03 MED ORDER — SODIUM CHLORIDE 0.9% FLUSH
3.0000 mL | Freq: Two times a day (BID) | INTRAVENOUS | Status: DC
Start: 2021-11-03 — End: 2021-11-03

## 2021-11-03 MED ORDER — ASPIRIN 81 MG PO CHEW: 81.0000 mg | CHEWABLE_TABLET | ORAL | Status: DC

## 2021-11-03 MED ORDER — SODIUM CHLORIDE 0.9% FLUSH
3.0000 mL | INTRAVENOUS | Status: DC | PRN
Start: 2021-11-03 — End: 2021-11-03

## 2021-11-03 MED ORDER — HYDRALAZINE HCL 20 MG/ML IJ SOLN: 5.0000 mg | INTRAMUSCULAR | Status: DC | PRN

## 2021-11-03 MED ORDER — MORPHINE SULFATE (PF) 2 MG/ML IV SOLN: 2.0000 mg | INTRAVENOUS | Status: DC | PRN

## 2021-11-03 MED ORDER — LOSARTAN POTASSIUM 25 MG PO TABS: 25.0000 mg | ORAL_TABLET | Freq: Every day | ORAL | Status: DC

## 2021-11-03 MED ORDER — HEPARIN (PORCINE) IN NACL 1000-0.9 UT/500ML-% IV SOLN
INTRAVENOUS | Status: DC | PRN
  Administered 2021-11-03 (×2): 500 mL

## 2021-11-03 MED ORDER — CLOPIDOGREL BISULFATE 75 MG PO TABS: 75.0000 mg | ORAL_TABLET | Freq: Every day | ORAL | Status: DC

## 2021-11-03 MED ORDER — ACETAMINOPHEN 325 MG PO TABS: 325.0000 mg | ORAL_TABLET | Freq: Three times a day (TID) | ORAL | Status: DC | PRN

## 2021-11-03 MED ORDER — SODIUM CHLORIDE 0.9 % WEIGHT BASED INFUSION
3.0000 mL/kg/h | INTRAVENOUS | Status: AC
  Administered 2021-11-03: 3 mL/kg/h via INTRAVENOUS

## 2021-11-03 MED ORDER — LIDOCAINE HCL (PF) 1 % IJ SOLN
INTRAMUSCULAR | Status: DC | PRN
  Administered 2021-11-03: 20 mL

## 2021-11-03 MED ORDER — LABETALOL HCL 5 MG/ML IV SOLN: 10.0000 mg | INTRAVENOUS | Status: DC | PRN

## 2021-11-03 MED ORDER — PRAVASTATIN SODIUM 40 MG PO TABS: 40.0000 mg | ORAL_TABLET | Freq: Every day | ORAL | Status: DC

## 2021-11-03 MED ORDER — IODIXANOL 320 MG/ML IV SOLN
INTRAVENOUS | Status: DC | PRN
  Administered 2021-11-03: 110 mL

## 2021-11-03 MED ORDER — CHLORTHALIDONE 25 MG PO TABS: 25.0000 mg | ORAL_TABLET | Freq: Every day | ORAL | Status: DC

## 2021-11-03 MED ORDER — SODIUM CHLORIDE 0.9 % IV SOLN: INTRAVENOUS | Status: AC

## 2021-11-03 MED ORDER — SODIUM CHLORIDE 0.9 % WEIGHT BASED INFUSION: 1.0000 mL/kg/h | INTRAVENOUS | Status: DC

## 2021-11-03 MED ORDER — METOPROLOL SUCCINATE ER 100 MG PO TB24: 100.0000 mg | ORAL_TABLET | Freq: Every day | ORAL | Status: DC

## 2021-11-03 MED ORDER — ACETAMINOPHEN 325 MG PO TABS: 650.0000 mg | ORAL_TABLET | ORAL | Status: DC | PRN

## 2021-11-03 MED ORDER — LIDOCAINE HCL (PF) 1 % IJ SOLN
INTRAMUSCULAR | Status: AC
  Filled 2021-11-03: qty 30

## 2021-11-03 MED ORDER — ASPIRIN 81 MG PO TABS: 81.0000 mg | ORAL_TABLET | Freq: Every day | ORAL | Status: DC

## 2021-11-03 MED ORDER — SODIUM CHLORIDE 0.9% FLUSH: 3.0000 mL | INTRAVENOUS | Status: DC | PRN

## 2021-11-03 MED ORDER — HEPARIN (PORCINE) IN NACL 1000-0.9 UT/500ML-% IV SOLN
INTRAVENOUS | Status: AC
  Filled 2021-11-03: qty 1000

## 2021-11-03 MED ORDER — ASPIRIN 81 MG PO TBEC: 81.0000 mg | DELAYED_RELEASE_TABLET | Freq: Every day | ORAL | Status: DC

## 2021-11-03 SURGICAL SUPPLY — 10 items
CATH ANGIO 5F PIGTAIL 65CM (CATHETERS) ×2 IMPLANT
KIT PV (KITS) ×4 IMPLANT
SHEATH PINNACLE 5F 10CM (SHEATH) ×2 IMPLANT
SHEATH PROBE COVER 6X72 (BAG) ×2 IMPLANT
STOPCOCK MORSE 400PSI 3WAY (MISCELLANEOUS) ×2 IMPLANT
SYR MEDRAD MARK 7 150ML (SYRINGE) ×4 IMPLANT
TRANSDUCER W/STOPCOCK (MISCELLANEOUS) ×4 IMPLANT
TRAY PV CATH (CUSTOM PROCEDURE TRAY) ×4 IMPLANT
TUBING CIL FLEX 10 FLL-RA (TUBING) ×2 IMPLANT
WIRE HITORQ VERSACORE ST 145CM (WIRE) ×2 IMPLANT

## 2021-11-03 NOTE — Consult Note (Addendum)
Hospital Consult    Reason for Consult:  PAD Requesting Physician:  Dr. Gwenlyn Found MRN #:  [id number]  History of Present Illness: This is a 76 y.o. male being seen in consultation for evaluation of PAD.  Past surgical history significant for he underwent aortogram with left lower extremity runoff with Dr. Gwenlyn Found this morning.  ABIs as of 09/29/2021 demonstrate right ABI of 0.58 and left ABI of 0.63.  He has had a right iliac stent in the past.  He has known chronic total occlusions of bilateral SFAs.  Patient's chief complaint is of pain in his feet and knees with standing.  He can only walk a short distance before having to rest due to the pain in his feet.  He denies rest pain in his feet overnight.  He also does not have any wounds of the bilateral lower extremities.  He is on aspirin, Plavix, statin daily.  He is a former smoker.  He has had coronary angioplasty in 1994.  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   Cerebrovascular disease    Moderate ASVD without focal stenosis in 01/2007   Colon polyps 3/12   single 57m polyp--tubular adenoma   Diverticulosis 2012   found on colonoscopy   Erectile dysfunction    Family history of adverse reaction to anesthesia    difficult for son & sistor to wake    GERD (gastroesophageal reflux disease)    Hematochezia 05/24/2010   Hyperlipidemia    Osteoarthritis    knees/hands-Dr Lucey   Overweight(278.02)    Peripheral vascular disease ([place] 03/11/2010   Moderate SFA stenosis; history of claudication   Pneumonia    Rosacea    Tobacco abuse, in remission    30-40 pack years discontinued in 1993   Vitamin B12 deficiency     Past Surgical History:  Procedure Laterality Date   APPENDECTOMY     COLONOSCOPY W/ POLYPECTOMY  2012   [place]/ STENTING Right 08/09/2015   PERIPHERAL VASCULAR CATHETERIZATION Bilateral  06/21/2015   Procedure: Lower Extremity Angiography;  Surgeon: JLorretta Harp MD;  [place] [facility];  Service: Cardiovascular;  Laterality: Bilateral;   PERIPHERAL VASCULAR CATHETERIZATION N/A 06/21/2015   Procedure: Abdominal Aortogram;  Surgeon: JLorretta Harp MD;  [facility];  Service: Cardiovascular;  Laterality: N/A;   PERIPHERAL VASCULAR CATHETERIZATION N/A 07/26/2015   Procedure: Lower Extremity Angiography;  Surgeon: JLorretta Harp MD;  [place] [facility];  Service: Cardiovascular;  Laterality: N/A;   PERIPHERAL VASCULAR CATHETERIZATION N/A 08/09/2015   Procedure: Lower Extremity Angiography;  Surgeon: JLorretta Harp MD;  [place] [facility];  Service: Cardiovascular;  Laterality: N/A;   PERIPHERAL VASCULAR CATHETERIZATION  08/09/2015   Procedure: Peripheral Vascular Intervention;  Surgeon: JLorretta Harp MD;  [place] [facility];  Service: Cardiovascular;;  rt ext. iliac atherectomy and stent   PERIPHERAL VASCULAR CATHETERIZATION N/A 09/20/2015   Procedure: Lower Extremity Angiography;  Surgeon: JLorretta Harp MD;  [place];  Service: Cardiovascular;  Laterality: N/A;   PERIPHERAL VASCULAR CATHETERIZATION Right 09/20/2015   Procedure: Peripheral Vascular Intervention;  Surgeon: JLorretta Harp MD;  [place] [facility] [place];  Service: Cardiovascular;  Laterality: Right;  SFA   ROTATOR CUFF REPAIR Left 7/15   Dr LRonnie Derby   Allergies  Allergen Reactions  Cardizem [Diltiazem Hcl] Hives and Rash    Prior to Admission medications   Medication Sig Start Date End Date Taking? Authorizing Provider  acetaminophen (TYLENOL) 325 MG tablet Take 325 mg by mouth 3 (three) times daily as needed for moderate pain.   Yes [provider]  aspirin 81 MG tablet Take 81 mg by mouth daily.   Yes [provider]  chlorthalidone (HYGROTON) 25 MG tablet TAKE 1/2 TABLET(12.5 MG) BY MOUTH DAILY  10/25/21  Yes Lorretta Harp, MD  clopidogrel (PLAVIX) 75 MG tablet TAKE 1 TABLET(75 MG) BY MOUTH DAILY WITH BREAKFAST 03/14/21  Yes Lorretta Harp, MD  Cyanocobalamin (VITAMIN B-12) 500 MCG SUBL Place 500 mcg under the tongue daily.   Yes [provider]  Deferasirox 180 MG TABS TAKE 4 TABLETS BY MOUTH DAILY 11/02/21  Yes Derek Jack, MD  losartan (COZAAR) 25 MG tablet TAKE ONE TABLET BY MOUTH EVERY DAY 11/29/20  Yes Venia Carbon, MD  metoprolol succinate (TOPROL-XL) 100 MG 24 hr tablet TAKE 1 TABLET(100 MG) BY MOUTH DAILY WITH OR IMMEDIATELY FOLLOWING A MEAL 09/26/21  Yes Branch, Alphonse Guild, MD  nitroGLYCERIN (NITROSTAT) 0.4 MG SL tablet Place 1 tablet (0.4 mg total) under the tongue every 5 (five) minutes as needed for chest pain. 10/12/21  Yes Lorretta Harp, MD  Omega-3 Fatty Acids (FISH OIL PO) Take 1,000 mg by mouth daily.   Yes [provider]  pravastatin (PRAVACHOL) 40 MG tablet TAKE 1 TABLET(40 MG) BY MOUTH DAILY 12/28/20  Yes Viviana Simpler I, MD  traMADol (ULTRAM) 50 MG tablet TAKE 1 TABLET BY MOUTH EVERY 8 HOURS AS NEEDED 10/05/21  Yes Venia Carbon, MD  Vitamin D, Ergocalciferol, (DRISDOL) 1.25 MG (50000 UNIT) CAPS capsule TAKE 1 CAPSULE BY MOUTH 1 TIME A WEEK Patient taking differently: Take 50,000 Units by mouth every Wednesday. 04/20/21  Yes Derek Jack, MD  cimetidine (TAGAMET) 400 MG tablet Take 400 mg by mouth 2 (two) times daily as needed (indigestion).     [provider]    Social History   Socioeconomic History   Marital status: Married    Spouse name: Not on file   Number of children: 3   Years of education: Not on file   Highest education level: Not on file  Occupational History   Occupation: Managed supply chain--- retired 2013    Comment: [employer]  Tobacco Use   Smoking status: Former    Packs/day: 1.00    Years: 30.00    Total pack years: 30.00    Types: Cigarettes    Quit date:  05/16/1990    Years since quitting: 31.4   Smokeless tobacco: Never   Tobacco comments:    Quit in 1993  Vaping Use   Vaping Use: Never used  Substance and Sexual Activity   Alcohol use: No    Alcohol/week: 0.0 standard drinks of alcohol   Drug use: No   Sexual activity: Not on file  Other Topics Concern   Not on file  Social History Narrative   Has living will   Wife is health care POA---alternate is son or daughter   Would accept resuscitation but no prolonged artificial ventilation.   No tube feeds if cognitively unaware   Social Determinants of Health   Financial Resource Strain: Low Risk  (03/10/2020)   Overall Financial Resource Strain (CARDIA)    Difficulty of Paying Living Expenses: Not hard at all  Food Insecurity: No Food Insecurity (03/10/2020)  Hunger Vital Sign    Worried About Running Out of Food in the Last Year: Never true    Ran Out of Food in the Last Year: Never true  Transportation Needs: No Transportation Needs (03/10/2020)   PRAPARE - Hydrologist (Medical): No    Lack of Transportation (Non-Medical): No  Physical Activity: Inactive (03/10/2020)   Exercise Vital Sign    Days of Exercise per Week: 0 days    Minutes of Exercise per Session: 0 min  Stress: No Stress Concern Present (03/10/2020)   [place]    Feeling of Stress : Not at all  Social Connections: Moderately Integrated (03/10/2020)   Social Connection and Isolation Panel [NHANES]    Frequency of Communication with Friends and Family: More than three times a week    Frequency of Social Gatherings with Friends and Family: Never    Attends Religious Services: More than 4 times per year    Active Member of [employer] or Organizations: No    Attends Archivist Meetings: Never    Marital Status: Married  Human resources officer Violence: Not At Risk (03/10/2020)   Humiliation, Afraid, Rape, and Kick  questionnaire    Fear of Current or Ex-Partner: No    Emotionally Abused: No    Physically Abused: No    Sexually Abused: No    \ Family History  Problem Relation Age of Onset   Hypertension Father        And siblings   Heart disease Father        And second-degree relatives   Transient ischemic attack Father    Lung cancer Mother    Diabetes Mother    Brain cancer Sister    Diabetes Brother    Atrial fibrillation Brother    Heart disease Sister    Heart disease Sister    Thyroid disease Sister    Ulcers Son    Diabetes Son    Diabetes Son     ROS: Otherwise negative unless mentioned in HPI  Physical Examination  Vitals:   11/03/21 1112 11/03/21 1127  BP: 128/68 (!) 147/67  Pulse: 63 64  Resp:    Temp:    SpO2: 95% 96%   Body mass index is 31.79 kg/m.  General:  WDWN in NAD Gait: Not observed HENT: WNL, normocephalic Pulmonary: normal non-labored breathing, without Rales, rhonchi,  wheezing Cardiac: regular Abdomen:  soft, NT/ND, no masses Skin:  rashes Vascular Exam/Pulses: PT and DP by Doppler bilateral lower extremities Extremities: without ischemic changes, without Gangrene , without cellulitis; without open wounds;  Musculoskeletal: no muscle wasting or atrophy  Neurologic: A&O X 3;  No focal weakness or paresthesias are detected; speech is fluent/normal Psychiatric:  The pt has Normal affect. Lymph:  Unremarkable  CBC    Component Value Date/Time   WBC 9.9 10/12/2021 1623   WBC 9.0 09/21/2021 1329   RBC 2.41 (LL) 10/12/2021 1623   RBC 2.38 (L) 09/21/2021 1329   HGB 9.4 (L) 10/12/2021 1623   HCT 26.4 (L) 10/12/2021 1623   PLT 327 10/12/2021 1623   MCV 110 (H) 10/12/2021 1623   MCH 39.0 (H) 10/12/2021 1623   MCH 38.7 (H) 09/21/2021 1329   MCHC 35.6 10/12/2021 1623   MCHC 33.0 09/21/2021 1329   RDW 13.2 10/12/2021 1623   LYMPHSABS 2.5 09/21/2021 1329   MONOABS 1.2 (H) 09/21/2021 1329   EOSABS 0.2 09/21/2021 1329   BASOSABS  0.1 09/21/2021  1329    BMET    Component Value Date/Time   NA 139 10/12/2021 1623   K 4.1 10/12/2021 1623   CL 103 10/12/2021 1623   CO2 20 10/12/2021 1623   GLUCOSE 89 10/12/2021 1623   GLUCOSE 95 09/21/2021 1329   BUN 17 10/12/2021 1623   CREATININE 1.17 10/12/2021 1623   CREATININE 0.91 09/15/2015 1508   CALCIUM 9.2 10/12/2021 1623   GFRNONAA >60 09/21/2021 1329   GFRAA >60 02/09/2020 1344    COAGS: Lab Results  Component Value Date   INR 1.08 09/15/2015   INR 1.07 08/03/2015   INR 1.04 07/19/2015     Non-Invasive Vascular Imaging:   ABI/TBIToday's ABIToday's TBIPrevious ABIPrevious TBI  +-------+-----------+-----------+------------+------------+  Right  0.58       0.39       [place]          0.40          +-------+-----------+-----------+------------+------------+  Left   0.63       0.00       [place]          0       ASSESSMENT/PLAN: This is a 76 y.o. male with bilateral lower extremity PAD  -Patient underwent angiography today with Dr. Gwenlyn Found.  On angiogram he has high-grade calcific disease of the external iliac and common femoral.  He has a known left chronic total occlusion of the SFA.  Patient's complaint is of pain in his feet and knees mostly with standing but also with walking.  Dr. Carlis Abbott will review the images and discuss open revascularization versus conservative management with the patient later this afternoon.   Dagoberto Ligas PA-C [facility] [phone]   I have seen and evaluated the patient. I agree with the PA note as documented above.  76 year old male that Dr. Gwenlyn Found has consulted vascular surgery for evaluation of left femoral endarterectomy.  Patient states he has cramping in both calves especially with exertion and this has been ongoing for years.  He also complains of numbness in both feet.  States he really cannot walk up and down stairs and cannot change the oil in his car and has very limited activity.  He has been trying to  manage his claudication conservatively.  He underwent angiogram today with Dr. Gwenlyn Found showing significant left common femoral disease with a near flush SFA occlusion.  Per Dr. Kennon Holter dictation patient had a 75% distal external iliac stenosis and a 90% calcified left common femoral stenosis with some disease into the profunda.  He has a short stump of SFA that then occludes.  He has distal reconstitution.  Ultimately in discussing with the patient's I briefly discussed femoral endarterectomy as well as the risk and benefits.  I offered to get him scheduled today versus follow-up with me in the office to further discuss.  He would like to see me in several weeks.  We will arrange follow-up in the office to further discuss left iliofemoral endarterectomy for lifestyle limiting claudication in the left leg.  Marty Heck, MD [facility] [place] Office: [phone]

## 2021-11-03 NOTE — Interval H&P Note (Signed)
History and Physical Interval Note:  11/03/2021 9:40 AM  Aaron Sexton  has presented today for surgery, with the diagnosis of PAD.  The various methods of treatment have been discussed with the patient and family. After consideration of risks, benefits and other options for treatment, the patient has consented to  Procedure(s): ABDOMINAL AORTOGRAM W/LOWER EXTREMITY (N/A) as a surgical intervention.  The patient's history has been reviewed, patient examined, no change in status, stable for surgery.  I have reviewed the patient's chart and labs.  Questions were answered to the patient's satisfaction.     [name]

## 2021-11-03 NOTE — Progress Notes (Signed)
Site area: Left groin a 5 french arterial sheath was removed  Site Prior to Removal:  Level 0  Pressure Applied For 20 MINUTES     Bedrest Beginning at 1050am X 4 hours  Manual:   Yes.    Patient Status During Pull:  stable  Post Pull Groin Site:  Level 0  Post Pull Instructions Given:  Yes.    Post Pull Pulses Present:  Yes.    Dressing Applied:  Yes.    Comments:

## 2021-11-03 NOTE — Progress Notes (Signed)
Up and walked and tolerated well; left groin stable, no bleeding or hematoma 

## 2021-11-04 ENCOUNTER — Telehealth: Payer: Self-pay | Admitting: Cardiovascular Disease

## 2021-11-04 ENCOUNTER — Encounter (HOSPITAL_COMMUNITY): Payer: Self-pay | Admitting: Cardiovascular Disease

## 2021-11-04 ENCOUNTER — Other Ambulatory Visit: Payer: Self-pay | Admitting: Internal Medicine

## 2021-11-04 NOTE — Telephone Encounter (Signed)
Wife of patient called. She was not sure what the next steps would be because the procedure did not go as planned.   Please call wife

## 2021-11-04 NOTE — Telephone Encounter (Signed)
Last filled 10-05-21 #90 Last OV 12-10-20 Next OV 12-14-21 [facility]

## 2021-11-04 NOTE — Telephone Encounter (Signed)
Aaron Sexton wanted to if patient should keep follow up for doppler on 11/15/21 and follow appt with Dr Gwenlyn Found 12/09/21  Aaron Sexton states  Dr Carlis Abbott did come by the patient's  room in the holding area  yesterday - per Aaron Sexton, Dr Carlis Abbott stated will see patient in 2 weeks. RN informed Aaron Sexton Dr Carlis Abbott will call her about appointment.  Aaron Sexton is aware will defer to Dr Gwenlyn Found for further information about upcoming appointment

## 2021-11-04 NOTE — Telephone Encounter (Signed)
Spoke with pt's wife regarding follow up dopplers. Dr. Gwenlyn Found was unable to intervene during PV procedure, therefore repeat dopplers are not necessary. Instructed wife to keep follow up office visit with Dr. Gwenlyn Found. Wife verbalizes understanding.

## 2021-11-07 ENCOUNTER — Telehealth: Payer: Self-pay

## 2021-11-07 ENCOUNTER — Other Ambulatory Visit (HOSPITAL_COMMUNITY): Payer: Self-pay

## 2021-11-09 ENCOUNTER — Other Ambulatory Visit (HOSPITAL_COMMUNITY): Payer: Self-pay

## 2021-11-10 ENCOUNTER — Encounter (HOSPITAL_COMMUNITY): Payer: Medicare PPO

## 2021-11-11 NOTE — Telephone Encounter (Signed)
Appt scheduled

## 2021-11-15 ENCOUNTER — Inpatient Hospital Stay (HOSPITAL_COMMUNITY): Admission: RE | Admit: 2021-11-15 | Payer: Medicare PPO | Source: Ambulatory Visit

## 2021-11-29 ENCOUNTER — Ambulatory Visit: Payer: Medicare PPO | Admitting: Vascular Surgery

## 2021-11-29 DIAGNOSIS — I70222 Atherosclerosis of native arteries of extremities with rest pain, left leg: Secondary | ICD-10-CM | POA: Diagnosis not present

## 2021-11-29 NOTE — Progress Notes (Signed)
Patient name: Aaron Sexton MRN: [id number] DOB: [birth year] Sex: male  REASON FOR VISIT: Discuss left femoral endarterectomy    HPI: Aaron Sexton is a 76 y.o. male with history of hyperlipidemia, coronary artery disease, PVD that presents to discuss left femoral endarterectomy.  Vascular surgery was consulted for left femoral endarterectomy after his recent angiogram by Dr. Allyson Sabal.  Patient underwent aortogram with lower extremity angiogram on 11/03/2021 with Dr. Allyson Sabal showing significant left common femoral disease with a near flush SFA occlusion.  He had approximate  75% stenosis in the distal external iliac artery and 90% stenosis in the left common femoral artery and some disease into the profunda.  Since leaving the hospital states he is developed a wound between the left first and second toe.  Still having a lot of cramping in the calf and can walk limited distances.  Also having numbness in the foot.  ABIs on 09/29/2021 are 0.58 on the right dampened monophasic and 0.63 on the left dampened monophasic.  Toe pressure on the left was 0.  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   Cerebrovascular disease    Moderate ASVD without focal stenosis in 01/2007   Colon polyps 3/12   single 2mm polyp--tubular adenoma   Diverticulosis 2012   found on colonoscopy   Erectile dysfunction    Family history of adverse reaction to anesthesia    difficult for son & sistor to wake    GERD (gastroesophageal reflux disease)    Hematochezia 05/24/2010   Hyperlipidemia    Osteoarthritis    knees/hands-Dr Lucey   Overweight(278.02)    Peripheral vascular disease ([facility]) 03/11/2010   Moderate SFA stenosis; history of claudication   Pneumonia    Rosacea    Tobacco abuse, in remission    30-40 pack years discontinued in 1993   Vitamin B12 deficiency     Past Surgical History:  Procedure  Laterality Date   APPENDECTOMY     COLONOSCOPY W/ POLYPECTOMY  2012   ILIAC VEIN ANGIOPLASTY / STENTING Right 08/09/2015   LOWER EXTREMITY ANGIOGRAPHY Bilateral 11/03/2021   Procedure: Lower Extremity Angiography;  Surgeon: Runell Gess, MD;  Location: [facility] INVASIVE CV LAB;  Service: Cardiovascular;  Laterality: Bilateral;  Limited Study   PERIPHERAL VASCULAR CATHETERIZATION Bilateral 06/21/2015   Procedure: Lower Extremity Angiography;  Surgeon: Runell Gess, MD;  Location: [facility] INVASIVE CV LAB;  Service: Cardiovascular;  Laterality: Bilateral;   PERIPHERAL VASCULAR CATHETERIZATION N/A 06/21/2015   Procedure: Abdominal Aortogram;  Surgeon: Runell Gess, MD;  Location: [facility] INVASIVE CV LAB;  Service: Cardiovascular;  Laterality: N/A;   PERIPHERAL VASCULAR CATHETERIZATION N/A 07/26/2015   Procedure: Lower Extremity Angiography;  Surgeon: Runell Gess, MD;  Location: [facility] INVASIVE CV LAB;  Service: Cardiovascular;  Laterality: N/A;   PERIPHERAL VASCULAR CATHETERIZATION N/A 08/09/2015   Procedure: Lower Extremity Angiography;  Surgeon: Runell Gess, MD;  Location: [facility] INVASIVE CV LAB;  Service: Cardiovascular;  Laterality: N/A;   PERIPHERAL VASCULAR CATHETERIZATION  08/09/2015   Procedure: Peripheral Vascular Intervention;  Surgeon: Runell Gess, MD;  Location: [facility] INVASIVE CV LAB;  Service: Cardiovascular;;  rt ext. iliac atherectomy and stent   PERIPHERAL VASCULAR CATHETERIZATION N/A 09/20/2015   Procedure: Lower Extremity Angiography;  Surgeon: Runell Gess, MD;  Location: [facility] INVASIVE CV LAB;  Service: Cardiovascular;  Laterality: N/A;   PERIPHERAL VASCULAR CATHETERIZATION  Right 09/20/2015   Procedure: Peripheral Vascular Intervention;  Surgeon: Runell Gess, MD;  Location: [facility] INVASIVE CV LAB;  Service: Cardiovascular;  Laterality: Right;  SFA   ROTATOR CUFF REPAIR Left 7/15   Dr Sherlean Foot    Family History  Problem Relation Age of Onset   Hypertension Father        And siblings    Heart disease Father        And second-degree relatives   Transient ischemic attack Father    Lung cancer Mother    Diabetes Mother    Brain cancer Sister    Diabetes Brother    Atrial fibrillation Brother    Heart disease Sister    Heart disease Sister    Thyroid disease Sister    Ulcers Son    Diabetes Son    Diabetes Son     SOCIAL HISTORY: Social History   Tobacco Use   Smoking status: Former    Packs/day: 1.00    Years: 30.00    Total pack years: 30.00    Types: Cigarettes    Quit date: 05/16/1990    Years since quitting: 31.5   Smokeless tobacco: Never   Tobacco comments:    Quit in 1993  Substance Use Topics   Alcohol use: No    Alcohol/week: 0.0 standard drinks of alcohol    Allergies  Allergen Reactions   Cardizem [Diltiazem Hcl] Hives and Rash    Current Outpatient Medications  Medication Sig Dispense Refill   acetaminophen (TYLENOL) 325 MG tablet Take 325 mg by mouth 3 (three) times daily as needed for moderate pain.     aspirin 81 MG tablet Take 81 mg by mouth daily.     chlorthalidone (HYGROTON) 25 MG tablet TAKE 1/2 TABLET(12.5 MG) BY MOUTH DAILY 30 tablet 3   cimetidine (TAGAMET) 400 MG tablet Take 400 mg by mouth 2 (two) times daily as needed (indigestion).      clopidogrel (PLAVIX) 75 MG tablet TAKE 1 TABLET(75 MG) BY MOUTH DAILY WITH BREAKFAST 30 tablet 9   Cyanocobalamin (VITAMIN B-12) 500 MCG SUBL Place 500 mcg under the tongue daily.     Deferasirox 180 MG TABS TAKE 4 TABLETS BY MOUTH DAILY 120 tablet 2   losartan (COZAAR) 25 MG tablet TAKE ONE TABLET BY MOUTH EVERY DAY 90 tablet 3   metoprolol succinate (TOPROL-XL) 100 MG 24 hr tablet TAKE 1 TABLET(100 MG) BY MOUTH DAILY WITH OR IMMEDIATELY FOLLOWING A MEAL 90 tablet 3   nitroGLYCERIN (NITROSTAT) 0.4 MG SL tablet Place 1 tablet (0.4 mg total) under the tongue every 5 (five) minutes as needed for chest pain. 25 tablet 1   Omega-3 Fatty Acids (FISH OIL PO) Take 1,000 mg by mouth daily.      pravastatin (PRAVACHOL) 40 MG tablet TAKE 1 TABLET(40 MG) BY MOUTH DAILY 90 tablet 3   traMADol (ULTRAM) 50 MG tablet TAKE 1 TABLET BY MOUTH EVERY 8 HOURS AS NEEDED 90 tablet 0   Vitamin D, Ergocalciferol, (DRISDOL) 1.25 MG (50000 UNIT) CAPS capsule TAKE 1 CAPSULE BY MOUTH 1 TIME A WEEK (Patient taking differently: Take 50,000 Units by mouth every Wednesday.) 12 capsule 2   Current Facility-Administered Medications  Medication Dose Route Frequency Provider Last Rate Last Admin   sodium chloride flush (NS) 0.9 % injection 3 mL  3 mL Intravenous Q12H Runell Gess, MD        REVIEW OF SYSTEMS:  [X]  denotes positive finding, [ ]  denotes negative finding Cardiac  Comments:  Chest pain or chest pressure:    Shortness of breath upon exertion:    Short of breath when lying flat:    Irregular heart rhythm:        Vascular    Pain in calf, thigh, or hip brought on by ambulation: x left  Pain in feet at night that wakes you up from your sleep:  x left  Blood clot in your veins:    Leg swelling:         Pulmonary    Oxygen at home:    Productive cough:     Wheezing:         Neurologic    Sudden weakness in arms or legs:     Sudden numbness in arms or legs:     Sudden onset of difficulty speaking or slurred speech:    Temporary loss of vision in one eye:     Problems with dizziness:         Gastrointestinal    Blood in stool:     Vomited blood:         Genitourinary    Burning when urinating:     Blood in urine:        Psychiatric    Major depression:         Hematologic    Bleeding problems:    Problems with blood clotting too easily:        Skin    Rashes or ulcers:        Constitutional    Fever or chills:      PHYSICAL EXAM: There were no vitals filed for this visit.  GENERAL: The patient is a well-nourished male, in no acute distress. The vital signs are documented above. CARDIAC: There is a regular rate and rhythm.  VASCULAR:  Left femoral pulse  weak Ischemic ulcer between the left first and second toes PULMONARY: No respiratory distress. ABDOMEN: Soft and non-tender. MUSCULOSKELETAL: There are no major deformities or cyanosis. NEUROLOGIC: No focal weakness or paresthesias are detected. SKIN: Ulcer between left 1st and 2nd toe as pictured PSYCHIATRIC: The patient has a normal affect.     DATA:   ABIs on 09/29/2021 are 0.58 on the right dampened monophasic and 0.63 on the left dampened monophasic.  Toe pressure on the left was 0.  Assessment/Plan:  76 year old male with history of lifestyle-limiting claudication in the left lower extremity that vascular surgery was consulted for left common femoral endarterectomy last month after angiogram by Dr. Allyson Sabal.  Since evaluation in the hospital he has evidence of new tissue loss between the left first and second toes that likely are ischemic.  He has a toe pressure of 0 on the left.  I discussed the option of left common femoral endarterectomy with likely added common femoral to above-knee popliteal bypass to get him inline flow and optimize his wound healing.  Discussed this should also alleviate his lifestyle limiting claudication and numbness in his left foot.  We will get scheduled for Monday, 12/12/2021.  Have asked that he put Betadine paint on the wound.  Risk benefits of surgery discussed.   Cephus Shelling, MD [facility] [place] Office: [phone]

## 2021-11-30 ENCOUNTER — Other Ambulatory Visit (HOSPITAL_COMMUNITY): Payer: Self-pay

## 2021-12-01 ENCOUNTER — Other Ambulatory Visit: Payer: Self-pay

## 2021-12-01 DIAGNOSIS — I70222 Atherosclerosis of native arteries of extremities with rest pain, left leg: Secondary | ICD-10-CM

## 2021-12-05 ENCOUNTER — Other Ambulatory Visit: Payer: Self-pay | Admitting: Internal Medicine

## 2021-12-05 NOTE — Telephone Encounter (Signed)
Last filled 11-04-21 #90 Last OV 12-10-20 Next OV 12-14-21 [facility]

## 2021-12-07 ENCOUNTER — Other Ambulatory Visit: Payer: Self-pay

## 2021-12-07 ENCOUNTER — Encounter (HOSPITAL_COMMUNITY): Payer: Self-pay | Admitting: Vascular Surgery

## 2021-12-07 NOTE — Progress Notes (Signed)
PCP - Dr. Silvio Pate  Cardiologist - Dr. Alvester Chou Dr. Harl Bowie  EP- Denies  Endocrine- Denies  Pulm- Denies  Chest x-ray - Denies  EKG - 09/01/21 (E)  Stress Test - Denies  ECHO - 09/16/21 (E)  Cardiac Cath - Denies  AICD-na PM-na LOOP-na  Nerve Stimulator- Denies  Dialysis- Denies  Sleep Study - Denies CPAP - Denies  LABS- 12/09/21: CBC, CMP, PT,PTT, T/S X2, UA, PCR  ASA- Cont. PLAVIX- LD- 7/29  ERAS- No  HA1C- Denies  Anesthesia- No  Pt denies having chest pain, sob, or fever during the pre-op phone call. All instructions explained to the pt, with a verbal understanding of the material including: as of today,stop taking all Aspirin (unless instructed by your doctor) and Other Aspirin containing products, Vitamins, Fish oils, and Herbal medications. Also stop all NSAIDS i.e. Advil, Ibuprofen, Motrin, Aleve, Anaprox, Naproxen, BC, Goody Powders, and all Supplements.Pt also instructed to wear a mask and social distance if he goes out. The opportunity to ask questions was provided.

## 2021-12-08 ENCOUNTER — Other Ambulatory Visit (HOSPITAL_COMMUNITY): Payer: Self-pay

## 2021-12-08 NOTE — Anesthesia Preprocedure Evaluation (Addendum)
Anesthesia Evaluation  Patient identified by MRN, date of birth, ID band Patient awake    Reviewed: Allergy & Precautions, H&P , NPO status , Patient's Chart, lab work & pertinent test results  History of Anesthesia Complications (+) Family history of anesthesia reaction  Airway Mallampati: II  TM Distance: >3 FB Neck ROM: Full    Dental no notable dental hx. (+) Teeth Intact, Dental Advisory Given   Pulmonary neg pulmonary ROS, former smoker,    Pulmonary exam normal breath sounds clear to auscultation       Cardiovascular Exercise Tolerance: Good hypertension, Pt. on medications and Pt. on home beta blockers + CAD and + Peripheral Vascular Disease   Rhythm:Regular Rate:Normal     Neuro/Psych negative neurological ROS  negative psych ROS   GI/Hepatic Neg liver ROS, GERD  Medicated,  Endo/Other  negative endocrine ROS  Renal/GU negative Renal ROS  negative genitourinary   Musculoskeletal  (+) Arthritis , Osteoarthritis,    Abdominal   Peds  Hematology  (+) Blood dyscrasia, anemia ,   Anesthesia Other Findings   Reproductive/Obstetrics negative OB ROS                            Anesthesia Physical Anesthesia Plan  ASA: 3  Anesthesia Plan: General   Post-op Pain Management: Tylenol PO (pre-op)*   Induction: Intravenous  PONV Risk Score and Plan: 3 and Ondansetron, Dexamethasone and Treatment may vary due to age or medical condition  Airway Management Planned: Oral ETT  Additional Equipment: Arterial line  Intra-op Plan:   Post-operative Plan: Extubation in OR  Informed Consent: I have reviewed the patients History and Physical, chart, labs and discussed the procedure including the risks, benefits and alternatives for the proposed anesthesia with the patient or authorized representative who has indicated his/her understanding and acceptance.     Dental advisory  given  Plan Discussed with: CRNA  Anesthesia Plan Comments:        Anesthesia Quick Evaluation

## 2021-12-09 ENCOUNTER — Inpatient Hospital Stay (HOSPITAL_COMMUNITY): Payer: Medicare PPO

## 2021-12-09 ENCOUNTER — Other Ambulatory Visit: Payer: Self-pay

## 2021-12-09 ENCOUNTER — Inpatient Hospital Stay (HOSPITAL_COMMUNITY): Payer: Medicare PPO | Admitting: Anesthesiology

## 2021-12-09 ENCOUNTER — Ambulatory Visit: Payer: Medicare PPO | Admitting: Cardiovascular Disease

## 2021-12-09 ENCOUNTER — Inpatient Hospital Stay (EMERGENCY_DEPARTMENT_HOSPITAL): Payer: Medicare PPO | Admitting: Anesthesiology

## 2021-12-09 ENCOUNTER — Encounter (HOSPITAL_COMMUNITY): Payer: Self-pay | Admitting: Vascular Surgery

## 2021-12-09 ENCOUNTER — Encounter (HOSPITAL_COMMUNITY)
Admission: RE | Disposition: A | Discharge status: Home Health | Payer: Self-pay | Source: Home / Self Care | Attending: Vascular Surgery

## 2021-12-09 ENCOUNTER — Inpatient Hospital Stay (HOSPITAL_COMMUNITY)
Admission: RE | Admit: 2021-12-09 | Discharge: 2021-12-13 | DRG: 271 | Disposition: A | Discharge status: Home Health | Payer: Medicare PPO | Attending: Vascular Surgery | Admitting: Vascular Surgery

## 2021-12-09 DIAGNOSIS — M19041 Primary osteoarthritis, right hand: Secondary | ICD-10-CM | POA: Diagnosis present

## 2021-12-09 DIAGNOSIS — E785 Hyperlipidemia, unspecified: Secondary | ICD-10-CM | POA: Diagnosis present

## 2021-12-09 DIAGNOSIS — K219 Gastro-esophageal reflux disease without esophagitis: Secondary | ICD-10-CM | POA: Diagnosis present

## 2021-12-09 DIAGNOSIS — N529 Male erectile dysfunction, unspecified: Secondary | ICD-10-CM | POA: Diagnosis present

## 2021-12-09 DIAGNOSIS — M19042 Primary osteoarthritis, left hand: Secondary | ICD-10-CM | POA: Diagnosis present

## 2021-12-09 DIAGNOSIS — I10 Essential (primary) hypertension: Secondary | ICD-10-CM

## 2021-12-09 DIAGNOSIS — Z7982 Long term (current) use of aspirin: Secondary | ICD-10-CM | POA: Diagnosis not present

## 2021-12-09 DIAGNOSIS — I70222 Atherosclerosis of native arteries of extremities with rest pain, left leg: Secondary | ICD-10-CM

## 2021-12-09 DIAGNOSIS — I2510 Atherosclerotic heart disease of native coronary artery without angina pectoris: Secondary | ICD-10-CM | POA: Diagnosis not present

## 2021-12-09 DIAGNOSIS — Z8249 Family history of ischemic heart disease and other diseases of the circulatory system: Secondary | ICD-10-CM

## 2021-12-09 DIAGNOSIS — Z87891 Personal history of nicotine dependence: Secondary | ICD-10-CM

## 2021-12-09 DIAGNOSIS — M170 Bilateral primary osteoarthritis of knee: Secondary | ICD-10-CM | POA: Diagnosis present

## 2021-12-09 DIAGNOSIS — Z888 Allergy status to other drugs, medicaments and biological substances status: Secondary | ICD-10-CM

## 2021-12-09 DIAGNOSIS — D62 Acute posthemorrhagic anemia: Secondary | ICD-10-CM | POA: Diagnosis not present

## 2021-12-09 DIAGNOSIS — Z79899 Other long term (current) drug therapy: Secondary | ICD-10-CM

## 2021-12-09 DIAGNOSIS — E538 Deficiency of other specified B group vitamins: Secondary | ICD-10-CM | POA: Diagnosis not present

## 2021-12-09 DIAGNOSIS — E8770 Fluid overload, unspecified: Secondary | ICD-10-CM | POA: Diagnosis present

## 2021-12-09 DIAGNOSIS — Z7902 Long term (current) use of antithrombotics/antiplatelets: Secondary | ICD-10-CM | POA: Diagnosis not present

## 2021-12-09 DIAGNOSIS — I70245 Atherosclerosis of native arteries of left leg with ulceration of other part of foot: Secondary | ICD-10-CM | POA: Diagnosis not present

## 2021-12-09 DIAGNOSIS — Z86010 Personal history of colonic polyps: Secondary | ICD-10-CM

## 2021-12-09 DIAGNOSIS — Z452 Encounter for adjustment and management of vascular access device: Secondary | ICD-10-CM | POA: Diagnosis not present

## 2021-12-09 DIAGNOSIS — I739 Peripheral vascular disease, unspecified: Secondary | ICD-10-CM | POA: Diagnosis present

## 2021-12-09 HISTORY — DX: Essential (primary) hypertension: I10

## 2021-12-09 HISTORY — PX: LOWER EXTREMITY ANGIOGRAM: SHX5508

## 2021-12-09 HISTORY — PX: INSERTION OF ILIAC STENT: SHX6256

## 2021-12-09 HISTORY — PX: FEMORAL-POPLITEAL BYPASS GRAFT: SHX937

## 2021-12-09 HISTORY — PX: ENDARTERECTOMY FEMORAL: SHX5804

## 2021-12-09 LAB — PREPARE RBC (CROSSMATCH)

## 2021-12-09 LAB — CBC
HCT: 25.2 % — ABNORMAL LOW (ref 39.0–52.0)
HCT: 23.8 % — ABNORMAL LOW (ref 39.0–52.0)
Hemoglobin: 8.4 g/dL — ABNORMAL LOW (ref 13.0–17.0)
Hemoglobin: 8.4 g/dL — ABNORMAL LOW (ref 13.0–17.0)
MCH: 39.1 pg — ABNORMAL HIGH (ref 26.0–34.0)
MCH: 30.9 pg (ref 26.0–34.0)
MCHC: 33.3 g/dL (ref 30.0–36.0)
MCHC: 35.3 g/dL (ref 30.0–36.0)
MCV: 117.2 fL — ABNORMAL HIGH (ref 80.0–100.0)
MCV: 87.5 fL (ref 80.0–100.0)
Platelets: 327 10*3/uL (ref 150–400)
Platelets: 106 10*3/uL — ABNORMAL LOW (ref 150–400)
RBC: 2.15 MIL/uL — ABNORMAL LOW (ref 4.22–5.81)
RBC: 2.72 MIL/uL — ABNORMAL LOW (ref 4.22–5.81)
RDW: 16 % — ABNORMAL HIGH (ref 11.5–15.5)
RDW: 19.8 % — ABNORMAL HIGH (ref 11.5–15.5)
WBC: 9.7 10*3/uL (ref 4.0–10.5)
WBC: 20.6 10*3/uL — ABNORMAL HIGH (ref 4.0–10.5)
nRBC: 0.4 % — ABNORMAL HIGH (ref 0.0–0.2)
nRBC: 1.4 % — ABNORMAL HIGH (ref 0.0–0.2)

## 2021-12-09 LAB — URINALYSIS, ROUTINE W REFLEX MICROSCOPIC
Bilirubin Urine: NEGATIVE
Glucose, UA: NEGATIVE mg/dL
Hgb urine dipstick: NEGATIVE
Ketones, ur: NEGATIVE mg/dL
Leukocytes,Ua: NEGATIVE
Nitrite: NEGATIVE
Protein, ur: NEGATIVE mg/dL
Specific Gravity, Urine: 1.025 (ref 1.005–1.030)
pH: 5 (ref 5.0–8.0)

## 2021-12-09 LAB — POCT I-STAT 7, (LYTES, BLD GAS, ICA,H+H)
Acid-base deficit: 7 mmol/L — ABNORMAL HIGH (ref 0.0–2.0)
Bicarbonate: 17.9 mmol/L — ABNORMAL LOW (ref 20.0–28.0)
Calcium, Ion: 1.1 mmol/L — ABNORMAL LOW (ref 1.15–1.40)
HCT: 26 % — ABNORMAL LOW (ref 39.0–52.0)
Hemoglobin: 8.8 g/dL — ABNORMAL LOW (ref 13.0–17.0)
O2 Saturation: 90 %
Potassium: 4.4 mmol/L (ref 3.5–5.1)
Sodium: 140 mmol/L (ref 135–145)
TCO2: 19 mmol/L — ABNORMAL LOW (ref 22–32)
pCO2 arterial: 32.4 mmHg (ref 32–48)
pH, Arterial: 7.35 (ref 7.35–7.45)
pO2, Arterial: 60 mmHg — ABNORMAL LOW (ref 83–108)

## 2021-12-09 LAB — APTT: aPTT: 28 seconds (ref 24–36)

## 2021-12-09 LAB — SURGICAL PCR SCREEN
MRSA, PCR: NEGATIVE
Staphylococcus aureus: NEGATIVE

## 2021-12-09 LAB — PROTIME-INR
INR: 1.1 (ref 0.8–1.2)
Prothrombin Time: 14.5 seconds (ref 11.4–15.2)

## 2021-12-09 LAB — COMPREHENSIVE METABOLIC PANEL
ALT: 14 U/L (ref 0–44)
AST: 16 U/L (ref 15–41)
Albumin: 3.8 g/dL (ref 3.5–5.0)
Alkaline Phosphatase: 40 U/L (ref 38–126)
Anion gap: 8 (ref 5–15)
BUN: 29 mg/dL — ABNORMAL HIGH (ref 8–23)
CO2: 23 mmol/L (ref 22–32)
Calcium: 8.9 mg/dL (ref 8.9–10.3)
Chloride: 106 mmol/L (ref 98–111)
Creatinine, Ser: 1.23 mg/dL (ref 0.61–1.24)
GFR, Estimated: >60 mL/min (ref >60)
Glucose, Bld: 106 mg/dL — ABNORMAL HIGH (ref 70–99)
Potassium: 3.5 mmol/L (ref 3.5–5.1)
Sodium: 137 mmol/L (ref 135–145)
Total Bilirubin: 0.6 mg/dL (ref 0.3–1.2)
Total Protein: 7.2 g/dL (ref 6.5–8.1)

## 2021-12-09 LAB — TROPONIN I (HIGH SENSITIVITY): Troponin I (High Sensitivity): 21 ng/L — ABNORMAL HIGH (ref <18)

## 2021-12-09 LAB — CREATININE, SERUM
Creatinine, Ser: 1.1 mg/dL (ref 0.61–1.24)
GFR, Estimated: >60 mL/min (ref >60)

## 2021-12-09 LAB — ABO/RH: ABO/RH(D): B POS

## 2021-12-09 SURGERY — ENDARTERECTOMY, FEMORAL
Anesthesia: General | Site: Groin | Laterality: Left

## 2021-12-09 MED ORDER — IODIXANOL 320 MG/ML IV SOLN
INTRAVENOUS | Status: DC | PRN
  Administered 2021-12-09: 50 mL

## 2021-12-09 MED ORDER — POLYETHYLENE GLYCOL 3350 17 G PO PACK: 17.0000 g | PACK | Freq: Every day | ORAL | Status: DC | PRN

## 2021-12-09 MED ORDER — OXYCODONE-ACETAMINOPHEN 5-325 MG PO TABS
1.0000 | ORAL_TABLET | ORAL | Status: DC | PRN
  Administered 2021-12-09 – 2021-12-11 (×3): 1 via ORAL
  Filled 2021-12-09 (×4): qty 1

## 2021-12-09 MED ORDER — CEFAZOLIN SODIUM-DEXTROSE 2-4 GM/100ML-% IV SOLN
2.0000 g | INTRAVENOUS | Status: AC
  Administered 2021-12-09: 2 g via INTRAVENOUS
  Filled 2021-12-09: qty 100

## 2021-12-09 MED ORDER — ACETAMINOPHEN 325 MG PO TABS
325.0000 mg | ORAL_TABLET | ORAL | Status: DC | PRN
  Administered 2021-12-10: 650 mg via ORAL
  Filled 2021-12-09 (×2): qty 2

## 2021-12-09 MED ORDER — POTASSIUM CHLORIDE CRYS ER 20 MEQ PO TBCR
20.0000 meq | EXTENDED_RELEASE_TABLET | Freq: Every day | ORAL | Status: AC | PRN
  Administered 2021-12-11: 40 meq via ORAL
  Filled 2021-12-09: qty 2

## 2021-12-09 MED ORDER — MORPHINE SULFATE (PF) 2 MG/ML IV SOLN
2.0000 mg | INTRAVENOUS | Status: DC | PRN
  Administered 2021-12-09 – 2021-12-10 (×5): 2 mg via INTRAVENOUS
  Filled 2021-12-09 (×5): qty 1

## 2021-12-09 MED ORDER — NITROGLYCERIN 0.4 MG SL SUBL: 0.4000 mg | SUBLINGUAL_TABLET | SUBLINGUAL | Status: DC | PRN

## 2021-12-09 MED ORDER — ONDANSETRON HCL 4 MG/2ML IJ SOLN: 4.0000 mg | Freq: Four times a day (QID) | INTRAMUSCULAR | Status: DC | PRN

## 2021-12-09 MED ORDER — LABETALOL HCL 5 MG/ML IV SOLN: 10.0000 mg | INTRAVENOUS | Status: DC | PRN

## 2021-12-09 MED ORDER — ASPIRIN 81 MG PO CHEW
81.0000 mg | CHEWABLE_TABLET | Freq: Every day | ORAL | Status: DC
  Administered 2021-12-10 – 2021-12-13 (×4): 81 mg via ORAL
  Filled 2021-12-09 (×5): qty 1

## 2021-12-09 MED ORDER — PHENYLEPHRINE 80 MCG/ML (10ML) SYRINGE FOR IV PUSH (FOR BLOOD PRESSURE SUPPORT)
PREFILLED_SYRINGE | INTRAVENOUS | Status: AC
Start: 2021-12-09 — End: ?
  Filled 2021-12-09: qty 10

## 2021-12-09 MED ORDER — DOCUSATE SODIUM 100 MG PO CAPS
100.0000 mg | ORAL_CAPSULE | Freq: Every day | ORAL | Status: DC
  Administered 2021-12-10 – 2021-12-12 (×3): 100 mg via ORAL
  Filled 2021-12-09 (×3): qty 1

## 2021-12-09 MED ORDER — LIDOCAINE HCL (CARDIAC) PF 100 MG/5ML IV SOSY
PREFILLED_SYRINGE | INTRAVENOUS | Status: DC | PRN
  Administered 2021-12-09: 80 mg via INTRATRACHEAL

## 2021-12-09 MED ORDER — HEPARIN 6000 UNIT IRRIGATION SOLUTION
Status: AC
  Filled 2021-12-09: qty 500

## 2021-12-09 MED ORDER — ROCURONIUM BROMIDE 100 MG/10ML IV SOLN
INTRAVENOUS | Status: DC | PRN
  Administered 2021-12-09: 30 mg via INTRAVENOUS
  Administered 2021-12-09: 10 mg via INTRAVENOUS
  Administered 2021-12-09: 70 mg via INTRAVENOUS
  Administered 2021-12-09 (×2): 20 mg via INTRAVENOUS

## 2021-12-09 MED ORDER — PHENYLEPHRINE HCL (PRESSORS) 10 MG/ML IV SOLN
INTRAVENOUS | Status: DC | PRN
  Administered 2021-12-09: 160 ug via INTRAVENOUS
  Administered 2021-12-09 (×6): 80 ug via INTRAVENOUS
  Administered 2021-12-09: 160 ug via INTRAVENOUS
  Administered 2021-12-09: 80 ug via INTRAVENOUS
  Administered 2021-12-09: 160 ug via INTRAVENOUS

## 2021-12-09 MED ORDER — PHENYLEPHRINE HCL-NACL 20-0.9 MG/250ML-% IV SOLN
0.0000 ug/min | INTRAVENOUS | Status: DC
  Administered 2021-12-10: 50 ug/min via INTRAVENOUS
  Filled 2021-12-09: qty 250

## 2021-12-09 MED ORDER — VITAMIN B-12 500 MCG SL SUBL
500.0000 ug | SUBLINGUAL_TABLET | Freq: Every day | SUBLINGUAL | Status: DC
Start: 2021-12-09 — End: 2021-12-09

## 2021-12-09 MED ORDER — PROTAMINE SULFATE 10 MG/ML IV SOLN
INTRAVENOUS | Status: AC
Start: 2021-12-09 — End: ?
  Filled 2021-12-09: qty 5

## 2021-12-09 MED ORDER — ROCURONIUM BROMIDE 10 MG/ML (PF) SYRINGE
PREFILLED_SYRINGE | INTRAVENOUS | Status: AC
  Filled 2021-12-09: qty 10

## 2021-12-09 MED ORDER — METOPROLOL TARTRATE 5 MG/5ML IV SOLN: 2.0000 mg | INTRAVENOUS | Status: DC | PRN

## 2021-12-09 MED ORDER — ONDANSETRON HCL 4 MG/2ML IJ SOLN
INTRAMUSCULAR | Status: AC
  Filled 2021-12-09: qty 2

## 2021-12-09 MED ORDER — SODIUM CHLORIDE 0.9 % IV SOLN
0.0000 ug/min | INTRAVENOUS | Status: DC
  Filled 2021-12-09 (×2): qty 2

## 2021-12-09 MED ORDER — HEPARIN 6000 UNIT IRRIGATION SOLUTION
Status: DC | PRN
  Administered 2021-12-09: 1

## 2021-12-09 MED ORDER — SODIUM CHLORIDE 0.9% FLUSH: 3.0000 mL | Freq: Two times a day (BID) | INTRAVENOUS | Status: DC

## 2021-12-09 MED ORDER — VASOPRESSIN 20 UNIT/ML IV SOLN
INTRAVENOUS | Status: DC | PRN
  Administered 2021-12-09: 1 [IU] via INTRAVENOUS
  Administered 2021-12-09: 2 [IU] via INTRAVENOUS
  Administered 2021-12-09 (×2): 1 [IU] via INTRAVENOUS
  Administered 2021-12-09: 3 [IU] via INTRAVENOUS
  Administered 2021-12-09 (×2): 1 [IU] via INTRAVENOUS

## 2021-12-09 MED ORDER — HEPARIN SODIUM (PORCINE) 1000 UNIT/ML IJ SOLN
INTRAMUSCULAR | Status: AC
  Filled 2021-12-09: qty 10

## 2021-12-09 MED ORDER — CEFAZOLIN SODIUM-DEXTROSE 2-4 GM/100ML-% IV SOLN
2.0000 g | Freq: Three times a day (TID) | INTRAVENOUS | Status: AC
  Administered 2021-12-09 – 2021-12-10 (×2): 2 g via INTRAVENOUS
  Filled 2021-12-09 (×2): qty 100

## 2021-12-09 MED ORDER — ORAL CARE MOUTH RINSE: 15.0000 mL | Freq: Once | OROMUCOSAL | Status: AC

## 2021-12-09 MED ORDER — PROPOFOL 10 MG/ML IV BOLUS
INTRAVENOUS | Status: AC
  Filled 2021-12-09: qty 20

## 2021-12-09 MED ORDER — GUAIFENESIN-DM 100-10 MG/5ML PO SYRP: 15.0000 mL | ORAL_SOLUTION | ORAL | Status: DC | PRN

## 2021-12-09 MED ORDER — CHLORHEXIDINE GLUCONATE CLOTH 2 % EX PADS: 6.0000 | MEDICATED_PAD | Freq: Once | CUTANEOUS | Status: DC

## 2021-12-09 MED ORDER — PROTAMINE SULFATE 10 MG/ML IV SOLN
INTRAVENOUS | Status: DC | PRN
  Administered 2021-12-09: 10 mg via INTRAVENOUS
  Administered 2021-12-09: 40 mg via INTRAVENOUS

## 2021-12-09 MED ORDER — SODIUM CHLORIDE 0.9 % IV SOLN: 10.0000 mL/h | Freq: Once | INTRAVENOUS | Status: DC

## 2021-12-09 MED ORDER — ALBUMIN HUMAN 5 % IV SOLN: 12.5000 g | Freq: Once | INTRAVENOUS | Status: DC

## 2021-12-09 MED ORDER — SODIUM CHLORIDE 0.9 % IV SOLN
INTRAVENOUS | Status: DC
Start: 2021-12-09 — End: 2021-12-13

## 2021-12-09 MED ORDER — PROPOFOL 10 MG/ML IV BOLUS
INTRAVENOUS | Status: DC | PRN
  Administered 2021-12-09: 130 mg via INTRAVENOUS

## 2021-12-09 MED ORDER — ACETAMINOPHEN 325 MG RE SUPP: 325.0000 mg | RECTAL | Status: DC | PRN

## 2021-12-09 MED ORDER — HYDRALAZINE HCL 20 MG/ML IJ SOLN: 5.0000 mg | INTRAMUSCULAR | Status: DC | PRN

## 2021-12-09 MED ORDER — ACETAMINOPHEN 500 MG PO TABS
1000.0000 mg | ORAL_TABLET | Freq: Once | ORAL | Status: AC
Start: 2021-12-09 — End: 2021-12-09
  Administered 2021-12-09: 500 mg via ORAL
  Filled 2021-12-09: qty 2

## 2021-12-09 MED ORDER — PHENOL 1.4 % MT LIQD: 1.0000 | OROMUCOSAL | Status: DC | PRN

## 2021-12-09 MED ORDER — SODIUM CHLORIDE 0.9 % IV SOLN: INTRAVENOUS | Status: DC

## 2021-12-09 MED ORDER — FENTANYL CITRATE (PF) 250 MCG/5ML IJ SOLN
INTRAMUSCULAR | Status: AC
  Filled 2021-12-09: qty 5

## 2021-12-09 MED ORDER — CHLORHEXIDINE GLUCONATE 0.12 % MT SOLN
15.0000 mL | Freq: Once | OROMUCOSAL | Status: AC
  Administered 2021-12-09: 15 mL via OROMUCOSAL
  Filled 2021-12-09: qty 15

## 2021-12-09 MED ORDER — SODIUM CHLORIDE 0.9% FLUSH: 10.0000 mL | INTRAVENOUS | Status: DC | PRN

## 2021-12-09 MED ORDER — STERILE WATER FOR IRRIGATION IR SOLN
Status: DC | PRN
  Administered 2021-12-09: 1000 mL

## 2021-12-09 MED ORDER — PHENYLEPHRINE HCL-NACL 20-0.9 MG/250ML-% IV SOLN
INTRAVENOUS | Status: DC | PRN
  Administered 2021-12-09: 20 ug/min via INTRAVENOUS

## 2021-12-09 MED ORDER — METOPROLOL SUCCINATE ER 100 MG PO TB24
100.0000 mg | ORAL_TABLET | Freq: Every day | ORAL | Status: DC
  Administered 2021-12-10 – 2021-12-13 (×4): 100 mg via ORAL
  Filled 2021-12-09 (×4): qty 1

## 2021-12-09 MED ORDER — HEMOSTATIC AGENTS (NO CHARGE) OPTIME
TOPICAL | Status: DC | PRN
  Administered 2021-12-09 (×7): 1 via TOPICAL

## 2021-12-09 MED ORDER — ONDANSETRON HCL 4 MG/2ML IJ SOLN
INTRAMUSCULAR | Status: DC | PRN
  Administered 2021-12-09: 4 mg via INTRAVENOUS

## 2021-12-09 MED ORDER — CHLORTHALIDONE 25 MG PO TABS
25.0000 mg | ORAL_TABLET | Freq: Every day | ORAL | Status: DC
  Administered 2021-12-10 – 2021-12-13 (×4): 25 mg via ORAL
  Filled 2021-12-09 (×4): qty 1

## 2021-12-09 MED ORDER — PRAVASTATIN SODIUM 40 MG PO TABS
40.0000 mg | ORAL_TABLET | Freq: Every day | ORAL | Status: DC
Start: 2021-12-09 — End: 2021-12-13
  Administered 2021-12-09 – 2021-12-12 (×4): 40 mg via ORAL
  Filled 2021-12-09 (×5): qty 1

## 2021-12-09 MED ORDER — DOPAMINE-DEXTROSE 3.2-5 MG/ML-% IV SOLN
INTRAVENOUS | Status: AC
  Filled 2021-12-09: qty 250

## 2021-12-09 MED ORDER — VITAMIN B-12 1000 MCG PO TABS
500.0000 ug | ORAL_TABLET | Freq: Every day | ORAL | Status: DC
  Administered 2021-12-11 – 2021-12-13 (×3): 500 ug via ORAL
  Filled 2021-12-09 (×5): qty 1

## 2021-12-09 MED ORDER — LIDOCAINE 2% (20 MG/ML) 5 ML SYRINGE
INTRAMUSCULAR | Status: AC
  Filled 2021-12-09: qty 5

## 2021-12-09 MED ORDER — ORAL CARE MOUTH RINSE: 15.0000 mL | OROMUCOSAL | Status: DC | PRN

## 2021-12-09 MED ORDER — HEPARIN SODIUM (PORCINE) 5000 UNIT/ML IJ SOLN
5000.0000 [IU] | Freq: Three times a day (TID) | INTRAMUSCULAR | Status: DC
Start: 2021-12-10 — End: 2021-12-13
  Administered 2021-12-10 – 2021-12-13 (×9): 5000 [IU] via SUBCUTANEOUS
  Filled 2021-12-09 (×8): qty 1

## 2021-12-09 MED ORDER — PHENYLEPHRINE HCL-NACL 20-0.9 MG/250ML-% IV SOLN
INTRAVENOUS | Status: AC
  Administered 2021-12-10: 60 ug/min via INTRAVENOUS
  Filled 2021-12-09: qty 250

## 2021-12-09 MED ORDER — SODIUM CHLORIDE 0.9 % IV SOLN: 10.0000 mL/h | Freq: Once | INTRAVENOUS | Status: AC

## 2021-12-09 MED ORDER — LOSARTAN POTASSIUM 25 MG PO TABS
25.0000 mg | ORAL_TABLET | Freq: Every day | ORAL | Status: DC
  Administered 2021-12-10 – 2021-12-13 (×4): 25 mg via ORAL
  Filled 2021-12-09 (×5): qty 1

## 2021-12-09 MED ORDER — FENTANYL CITRATE (PF) 100 MCG/2ML IJ SOLN
INTRAMUSCULAR | Status: DC | PRN
  Administered 2021-12-09: 10 ug via INTRAVENOUS
  Administered 2021-12-09 (×2): 20 ug via INTRAVENOUS
  Administered 2021-12-09: 10 ug via INTRAVENOUS
  Administered 2021-12-09: 100 ug via INTRAVENOUS
  Administered 2021-12-09 (×2): 20 ug via INTRAVENOUS

## 2021-12-09 MED ORDER — ALUM & MAG HYDROXIDE-SIMETH 200-200-20 MG/5ML PO SUSP: 15.0000 mL | ORAL | Status: DC | PRN

## 2021-12-09 MED ORDER — ALBUMIN HUMAN 5 % IV SOLN
INTRAVENOUS | Status: AC
  Administered 2021-12-09: 12.5 g
  Filled 2021-12-09: qty 500

## 2021-12-09 MED ORDER — SODIUM CHLORIDE 0.9 % IV SOLN: 500.0000 mL | Freq: Once | INTRAVENOUS | Status: DC | PRN

## 2021-12-09 MED ORDER — BISACODYL 10 MG RE SUPP: 10.0000 mg | Freq: Every day | RECTAL | Status: DC | PRN

## 2021-12-09 MED ORDER — ALBUMIN HUMAN 5 % IV SOLN: INTRAVENOUS | Status: DC | PRN

## 2021-12-09 MED ORDER — SODIUM CHLORIDE 0.9% FLUSH
10.0000 mL | Freq: Two times a day (BID) | INTRAVENOUS | Status: DC
  Administered 2021-12-09 – 2021-12-10 (×2): 10 mL
  Administered 2021-12-10 – 2021-12-11 (×2): 20 mL
  Administered 2021-12-11 – 2021-12-12 (×2): 10 mL

## 2021-12-09 MED ORDER — PANTOPRAZOLE SODIUM 40 MG PO TBEC
40.0000 mg | DELAYED_RELEASE_TABLET | Freq: Every day | ORAL | Status: DC
  Administered 2021-12-09 – 2021-12-13 (×4): 40 mg via ORAL
  Filled 2021-12-09 (×4): qty 1

## 2021-12-09 MED ORDER — CHLORHEXIDINE GLUCONATE CLOTH 2 % EX PADS
6.0000 | MEDICATED_PAD | Freq: Every day | CUTANEOUS | Status: DC
  Administered 2021-12-09 – 2021-12-12 (×2): 6 via TOPICAL

## 2021-12-09 MED ORDER — 0.9 % SODIUM CHLORIDE (POUR BTL) OPTIME
TOPICAL | Status: DC | PRN
  Administered 2021-12-09 (×3): 1000 mL

## 2021-12-09 MED ORDER — MAGNESIUM SULFATE 2 GM/50ML IV SOLN: 2.0000 g | Freq: Every day | INTRAVENOUS | Status: DC | PRN

## 2021-12-09 MED ORDER — SUGAMMADEX SODIUM 200 MG/2ML IV SOLN
INTRAVENOUS | Status: DC | PRN
  Administered 2021-12-09: 184.2 mg via INTRAVENOUS

## 2021-12-09 MED ORDER — LACTATED RINGERS IV SOLN: INTRAVENOUS | Status: DC

## 2021-12-09 MED ORDER — HEPARIN SODIUM (PORCINE) 1000 UNIT/ML IJ SOLN
INTRAMUSCULAR | Status: DC | PRN
  Administered 2021-12-09: 3000 [IU] via INTRAVENOUS
  Administered 2021-12-09: 2000 [IU] via INTRAVENOUS
  Administered 2021-12-09: 3000 [IU] via INTRAVENOUS
  Administered 2021-12-09: 2000 [IU] via INTRAVENOUS
  Administered 2021-12-09: 10000 [IU] via INTRAVENOUS
  Administered 2021-12-09 (×2): 2000 [IU] via INTRAVENOUS

## 2021-12-09 MED ORDER — HYDROMORPHONE HCL 1 MG/ML IJ SOLN: 0.2500 mg | INTRAMUSCULAR | Status: DC | PRN

## 2021-12-09 MED ORDER — VASOPRESSIN 20 UNIT/ML IV SOLN
INTRAVENOUS | Status: AC
  Filled 2021-12-09: qty 1

## 2021-12-09 MED ORDER — CHLORHEXIDINE GLUCONATE CLOTH 2 % EX PADS
6.0000 | MEDICATED_PAD | Freq: Every day | CUTANEOUS | Status: DC
  Administered 2021-12-10 – 2021-12-11 (×2): 6 via TOPICAL

## 2021-12-09 MED ORDER — CLOPIDOGREL BISULFATE 75 MG PO TABS
75.0000 mg | ORAL_TABLET | Freq: Once | ORAL | Status: AC
  Administered 2021-12-09: 75 mg via ORAL
  Filled 2021-12-09: qty 1

## 2021-12-09 SURGICAL SUPPLY — 74 items
ADH SKN CLS APL DERMABOND .7 (GAUZE/BANDAGES/DRESSINGS) ×4
AGENT HMST KT MTR STRL THRMB (HEMOSTASIS) ×2
BAG BANDED W/RUBBER/TAPE 36X54 (MISCELLANEOUS) ×1 IMPLANT
BAG COUNTER SPONGE SURGICOUNT (BAG) ×4 IMPLANT
BAG EQP BAND 135X91 W/RBR TAPE (MISCELLANEOUS) ×2
BAG SPNG CNTER NS LX DISP (BAG) ×2
BALLN MUSTANG 6X80X75 (BALLOONS) ×3
BALLOON MUSTANG 6X80X75 (BALLOONS) IMPLANT
BLADE CLIPPER SURG (BLADE) ×4 IMPLANT
CANISTER SUCT 3000ML PPV (MISCELLANEOUS) ×4 IMPLANT
CATH ANGIO BERNSTEIN 5X65X.035 (CATHETERS) ×1 IMPLANT
CATH EMB 4FR 40CM (CATHETERS) ×1 IMPLANT
CLIP VESOCCLUDE MED 24/CT (CLIP) ×4 IMPLANT
CLIP VESOCCLUDE SM WIDE 24/CT (CLIP) ×4 IMPLANT
COVER DOME SNAP 22 D (MISCELLANEOUS) ×1 IMPLANT
COVER PROBE W GEL 5X96 (DRAPES) ×4 IMPLANT
DERMABOND ADVANCED (GAUZE/BANDAGES/DRESSINGS) ×2
DERMABOND ADVANCED .7 DNX12 (GAUZE/BANDAGES/DRESSINGS) ×3 IMPLANT
DRAPE HALF SHEET 40X57 (DRAPES) ×1 IMPLANT
ELECT BLADE 4.0 EZ CLEAN MEGAD (MISCELLANEOUS) ×3
ELECT REM PT RETURN 9FT ADLT (ELECTROSURGICAL) ×3
ELECTRODE BLDE 4.0 EZ CLN MEGD (MISCELLANEOUS) IMPLANT
ELECTRODE REM PT RTRN 9FT ADLT (ELECTROSURGICAL) ×3 IMPLANT
EVACUATOR SILICONE 100CC (DRAIN) IMPLANT
FELT TEFLON 1X6 (MISCELLANEOUS) ×1 IMPLANT
GAUZE 4X4 16PLY ~~LOC~~+RFID DBL (SPONGE) ×2 IMPLANT
GAUZE SPONGE 4X4 12PLY STRL (GAUZE/BANDAGES/DRESSINGS) ×1 IMPLANT
GLIDEWIRE ADV .035X180CM (WIRE) ×1 IMPLANT
GLIDEWIRE ADV .035X260CM (WIRE) ×1 IMPLANT
GLOVE BIO SURGEON STRL SZ7.5 (GLOVE) ×4 IMPLANT
GLOVE BIOGEL PI IND STRL 8 (GLOVE) ×3 IMPLANT
GLOVE BIOGEL PI INDICATOR 8 (GLOVE) ×1
GOWN STRL REUS W/ TWL LRG LVL3 (GOWN DISPOSABLE) ×9 IMPLANT
GOWN STRL REUS W/ TWL XL LVL3 (GOWN DISPOSABLE) ×6 IMPLANT
GOWN STRL REUS W/TWL LRG LVL3 (GOWN DISPOSABLE) ×9
GOWN STRL REUS W/TWL XL LVL3 (GOWN DISPOSABLE) ×6
GRAFT PROPATEN W/RING 6X80X60 (Vascular Products) ×1 IMPLANT
HEMOSTAT SNOW SURGICEL 2X4 (HEMOSTASIS) ×4 IMPLANT
INSERT FOGARTY SM (MISCELLANEOUS) ×1 IMPLANT
KIT BASIN OR (CUSTOM PROCEDURE TRAY) ×4 IMPLANT
KIT ENCORE 26 ADVANTAGE (KITS) ×1 IMPLANT
KIT TURNOVER KIT B (KITS) ×4 IMPLANT
NS IRRIG 1000ML POUR BTL (IV SOLUTION) ×9 IMPLANT
PACK PERIPHERAL VASCULAR (CUSTOM PROCEDURE TRAY) ×4 IMPLANT
PAD ARMBOARD 7.5X6 YLW CONV (MISCELLANEOUS) ×8 IMPLANT
PATCH VASC XENOSURE 1CMX6CM (Vascular Products) ×3 IMPLANT
PATCH VASC XENOSURE 1X6 (Vascular Products) IMPLANT
PENCIL BUTTON HOLSTER BLD 10FT (ELECTRODE) ×1 IMPLANT
POWDER SURGICEL 3.0 GRAM (HEMOSTASIS) ×2 IMPLANT
SET MICROPUNCTURE 5F STIFF (MISCELLANEOUS) ×2 IMPLANT
SHEATH BRITE TIP 7FR 35CM (SHEATH) ×1 IMPLANT
SHEATH BRITE TIP 7FRX11 (SHEATH) ×1 IMPLANT
SPONGE T-LAP 18X18 ~~LOC~~+RFID (SPONGE) ×4 IMPLANT
STENT ELUVIA 7X100X130 (Permanent Stent) ×1 IMPLANT
STENT VIABAHNBX 7X39X136 (Permanent Stent) ×1 IMPLANT
STOPCOCK 4 WAY LG BORE MALE ST (IV SETS) ×1 IMPLANT
SURGIFLO W/THROMBIN 8M KIT (HEMOSTASIS) ×1 IMPLANT
SUT MNCRL AB 4-0 PS2 18 (SUTURE) ×12 IMPLANT
SUT PROLENE 5 0 C 1 24 (SUTURE) ×13 IMPLANT
SUT PROLENE 6 0 BV (SUTURE) ×16 IMPLANT
SUT SILK 3 0 (SUTURE)
SUT SILK 3-0 18XBRD TIE 12 (SUTURE) IMPLANT
SUT VIC AB 2-0 CT1 27 (SUTURE) ×15
SUT VIC AB 2-0 CT1 TAPERPNT 27 (SUTURE) ×6 IMPLANT
SUT VIC AB 3-0 SH 27 (SUTURE) ×12
SUT VIC AB 3-0 SH 27X BRD (SUTURE) ×9 IMPLANT
SYR 10ML LL (SYRINGE) ×2 IMPLANT
SYR 3ML LL SCALE MARK (SYRINGE) ×1 IMPLANT
TOWEL GREEN STERILE (TOWEL DISPOSABLE) ×4 IMPLANT
TRAY FOLEY MTR SLVR 16FR STAT (SET/KITS/TRAYS/PACK) ×4 IMPLANT
TUBING CIL FLEX 10 FLL-RA (TUBING) ×1 IMPLANT
UNDERPAD 30X36 HEAVY ABSORB (UNDERPADS AND DIAPERS) ×4 IMPLANT
WATER STERILE IRR 1000ML POUR (IV SOLUTION) ×4 IMPLANT
WIRE TORQFLEX AUST .018X40CM (WIRE) ×1 IMPLANT

## 2021-12-09 NOTE — Transfer of Care (Signed)
Immediate Anesthesia Transfer of Care Note  Patient: Aaron Sexton  Procedure(s) Performed: LEFT COMMON FEMORAL ENDARTERECTOMY WITH 1 CM X 6 CM XENOSURE BOVINE PATCH ANGIOPLASTY (Left: Groin) LEFT FEMORAL- ABOVE KNEE POPLITEAL BYPASS WITH 6 mm x 80 cm PROPATEN GRAFT (Left: Groin) LEFT ILIAC AND LOWER EXTREMITY ANGIOGRAM (Left: Groin) INSERTION OF LEFT COMMON AND EXTERNAL  ILIAC STENTS WITH ANGIOPLASTY (Groin)  Patient Location: PACU  Anesthesia Type:General  Level of Consciousness: awake, alert  and oriented  Airway & Oxygen Therapy: Patient Spontanous Breathing and Patient connected to face mask oxygen  Post-op Assessment: Report given to RN, Post -op Vital signs reviewed and stable, Patient moving all extremities X 4 and Patient able to stick tongue midline  Post vital signs: Reviewed97.6  Last Vitals:  Vitals Value Taken Time  BP 130/58 12/09/21 1352  Temp 97.6   Pulse 82 12/09/21 1355  Resp 26 12/09/21 1355  SpO2 97 % 12/09/21 1355  Vitals shown include unvalidated device data.  Last Pain:  Vitals:   12/09/21 0618  TempSrc:   PainSc: 0-No pain      Patients Stated Pain Goal: 2 (51/10/21 1173)  Complications: No notable events documented.

## 2021-12-09 NOTE — Progress Notes (Signed)
Called to pacu due to hypotension. Pacu had recently started  Dopamine when the BP suddenly dropped. ABG drawn and Hgb 8.8. I ordered a unit of blood as well as a unit of FFP. We only had a PIV for access so I decided to place a CVP. We started the blood and added a phenylephrine infusion. I ordered an EKG and CXR as well as troponins.

## 2021-12-09 NOTE — H&P (Signed)
History and Physical Interval Note:  12/09/2021 7:30 AM  Aaron Sexton  has presented today for surgery, with the diagnosis of Critical limb ischemia of left lower extremity.  The various methods of treatment have been discussed with the patient and family. After consideration of risks, benefits and other options for treatment, the patient has consented to  Procedure(s) with comments: LEFT COMMON FEMORAL ENDARTERECTOMY (Left) LEFT FEMORAL-POPLITEAL BYPASS WITH POSSIBLE VEIN (Left) - INSERT ARTERIAL LINE as a surgical intervention.  The patient's history has been reviewed, patient examined, no change in status, stable for surgery.  I have reviewed the patient's chart and labs.  Questions were answered to the patient's satisfaction.     Discussed plan for left common femoral endarterectomy with femoral to above-knee popliteal bypass.  Patient now has an ischemic ulcer between his first and second toe with critical limb ischemia.  Aaron Sexton  Patient name: Aaron Sexton  MRN: [id number]        DOB: March 22, 1946        Sex: male   REASON FOR VISIT: Discuss left femoral endarterectomy     HPI: Aaron Sexton is a 75 y.o. male with history of hyperlipidemia, coronary artery disease, PVD that presents to discuss left femoral endarterectomy.  Vascular surgery was consulted for left femoral endarterectomy after his recent angiogram by Dr. Allyson Sabal.  Patient underwent aortogram with lower extremity angiogram on 11/03/2021 with Dr. Allyson Sabal showing significant left common femoral disease with a near flush SFA occlusion.  He had approximate  75% stenosis in the distal external iliac artery and 90% stenosis in the left common femoral artery and some disease into the profunda.  Since leaving the hospital states he is developed a wound between the left first and second toe.  Still having a lot of cramping in the calf and can walk limited distances.  Also having numbness in the foot.   ABIs on 09/29/2021 are  0.58 on the right dampened monophasic and 0.63 on the left dampened monophasic.  Toe pressure on the left was 0.       Past Medical History:  Diagnosis Date   Anemia     Arteriosclerotic cardiovascular disease (ASCVD) 1993    Critical RCA disease in 1993 treated with PTCA   Cerebrovascular disease      Moderate ASVD without focal stenosis in 01/2007   Colon polyps 3/12    single 2mm polyp--tubular adenoma   Diverticulosis 2012    found on colonoscopy   Erectile dysfunction     Family history of adverse reaction to anesthesia      difficult for son & sistor to wake    GERD (gastroesophageal reflux disease)     Hematochezia 05/24/2010   Hyperlipidemia     Osteoarthritis      knees/hands-Dr Lucey   Overweight(278.02)     Peripheral vascular disease (HCC) 03/11/2010    Moderate SFA stenosis; history of claudication   Pneumonia     Rosacea     Tobacco abuse, in remission      30-40 pack years discontinued in 1993   Vitamin B12 deficiency             Past Surgical History:  Procedure Laterality Date   APPENDECTOMY       COLONOSCOPY W/ POLYPECTOMY   2012   ILIAC VEIN ANGIOPLASTY / STENTING Right 08/09/2015   LOWER EXTREMITY ANGIOGRAPHY Bilateral 11/03/2021    Procedure: Lower Extremity Angiography;  Surgeon: Runell Gess, MD;  Location: [facility] INVASIVE CV LAB;  Service: Cardiovascular;  Laterality: Bilateral;  Limited Study   PERIPHERAL VASCULAR CATHETERIZATION Bilateral 06/21/2015    Procedure: Lower Extremity Angiography;  Surgeon: Runell Gess, MD;  Location: [facility] INVASIVE CV LAB;  Service: Cardiovascular;  Laterality: Bilateral;   PERIPHERAL VASCULAR CATHETERIZATION N/A 06/21/2015    Procedure: Abdominal Aortogram;  Surgeon: Runell Gess, MD;  Location: [facility] INVASIVE CV LAB;  Service: Cardiovascular;  Laterality: N/A;   PERIPHERAL VASCULAR CATHETERIZATION N/A 07/26/2015    Procedure: Lower Extremity Angiography;  Surgeon: Runell Gess, MD;  Location: [facility] INVASIVE CV LAB;   Service: Cardiovascular;  Laterality: N/A;   PERIPHERAL VASCULAR CATHETERIZATION N/A 08/09/2015    Procedure: Lower Extremity Angiography;  Surgeon: Runell Gess, MD;  Location: [facility] INVASIVE CV LAB;  Service: Cardiovascular;  Laterality: N/A;   PERIPHERAL VASCULAR CATHETERIZATION   08/09/2015    Procedure: Peripheral Vascular Intervention;  Surgeon: Runell Gess, MD;  Location: [facility] INVASIVE CV LAB;  Service: Cardiovascular;;  rt ext. iliac atherectomy and stent   PERIPHERAL VASCULAR CATHETERIZATION N/A 09/20/2015    Procedure: Lower Extremity Angiography;  Surgeon: Runell Gess, MD;  Location: [facility] INVASIVE CV LAB;  Service: Cardiovascular;  Laterality: N/A;   PERIPHERAL VASCULAR CATHETERIZATION Right 09/20/2015    Procedure: Peripheral Vascular Intervention;  Surgeon: Runell Gess, MD;  Location: [facility] INVASIVE CV LAB;  Service: Cardiovascular;  Laterality: Right;  SFA   ROTATOR CUFF REPAIR Left 7/15    Dr Sherlean Foot           Family History  Problem Relation Age of Onset   Hypertension Father          And siblings   Heart disease Father          And second-degree relatives   Transient ischemic attack Father     Lung cancer Mother     Diabetes Mother     Brain cancer Sister     Diabetes Brother     Atrial fibrillation Brother     Heart disease Sister     Heart disease Sister     Thyroid disease Sister     Ulcers Son     Diabetes Son     Diabetes Son        SOCIAL HISTORY: Social History         Tobacco Use   Smoking status: Former      Packs/day: 1.00      Years: 30.00      Total pack years: 30.00      Types: Cigarettes      Quit date: 05/16/1990      Years since quitting: 31.5   Smokeless tobacco: Never   Tobacco comments:      Quit in 1993  Substance Use Topics   Alcohol use: No      Alcohol/week: 0.0 standard drinks of alcohol          Allergies  Allergen Reactions   Cardizem [Diltiazem Hcl] Hives and Rash            Current Outpatient Medications   Medication Sig Dispense Refill   acetaminophen (TYLENOL) 325 MG tablet Take 325 mg by mouth 3 (three) times daily as needed for moderate pain.       aspirin 81 MG tablet Take 81 mg by mouth daily.       chlorthalidone (HYGROTON) 25 MG tablet TAKE 1/2 TABLET(12.5 MG) BY MOUTH DAILY 30 tablet 3   cimetidine (TAGAMET) 400 MG  tablet Take 400 mg by mouth 2 (two) times daily as needed (indigestion).        clopidogrel (PLAVIX) 75 MG tablet TAKE 1 TABLET(75 MG) BY MOUTH DAILY WITH BREAKFAST 30 tablet 9   Cyanocobalamin (VITAMIN B-12) 500 MCG SUBL Place 500 mcg under the tongue daily.       Deferasirox 180 MG TABS TAKE 4 TABLETS BY MOUTH DAILY 120 tablet 2   losartan (COZAAR) 25 MG tablet TAKE ONE TABLET BY MOUTH EVERY DAY 90 tablet 3   metoprolol succinate (TOPROL-XL) 100 MG 24 hr tablet TAKE 1 TABLET(100 MG) BY MOUTH DAILY WITH OR IMMEDIATELY FOLLOWING A MEAL 90 tablet 3   nitroGLYCERIN (NITROSTAT) 0.4 MG SL tablet Place 1 tablet (0.4 mg total) under the tongue every 5 (five) minutes as needed for chest pain. 25 tablet 1   Omega-3 Fatty Acids (FISH OIL PO) Take 1,000 mg by mouth daily.       pravastatin (PRAVACHOL) 40 MG tablet TAKE 1 TABLET(40 MG) BY MOUTH DAILY 90 tablet 3   traMADol (ULTRAM) 50 MG tablet TAKE 1 TABLET BY MOUTH EVERY 8 HOURS AS NEEDED 90 tablet 0   Vitamin D, Ergocalciferol, (DRISDOL) 1.25 MG (50000 UNIT) CAPS capsule TAKE 1 CAPSULE BY MOUTH 1 TIME A WEEK (Patient taking differently: Take 50,000 Units by mouth every Wednesday.) 12 capsule 2             Current Facility-Administered Medications  Medication Dose Route Frequency Provider Last Rate Last Admin   sodium chloride flush (NS) 0.9 % injection 3 mL  3 mL Intravenous Q12H Runell Gess, MD          REVIEW OF SYSTEMS:  [X]  denotes positive finding, [ ]  denotes negative finding Cardiac   Comments:  Chest pain or chest pressure:      Shortness of breath upon exertion:      Short of breath when lying flat:       Irregular heart rhythm:             Vascular      Pain in calf, thigh, or hip brought on by ambulation: x left  Pain in feet at night that wakes you up from your sleep:  x left  Blood clot in your veins:      Leg swelling:              Pulmonary      Oxygen at home:      Productive cough:       Wheezing:              Neurologic      Sudden weakness in arms or legs:       Sudden numbness in arms or legs:       Sudden onset of difficulty speaking or slurred speech:      Temporary loss of vision in one eye:       Problems with dizziness:              Gastrointestinal      Blood in stool:       Vomited blood:              Genitourinary      Burning when urinating:       Blood in urine:             Psychiatric      Major depression:  Hematologic      Bleeding problems:      Problems with blood clotting too easily:             Skin      Rashes or ulcers:             Constitutional      Fever or chills:          PHYSICAL EXAM: There were no vitals filed for this visit.   GENERAL: The patient is a well-nourished male, in no acute distress. The vital signs are documented above. CARDIAC: There is a regular rate and rhythm.  VASCULAR:  Left femoral pulse weak Ischemic ulcer between the left first and second toes PULMONARY: No respiratory distress. ABDOMEN: Soft and non-tender. MUSCULOSKELETAL: There are no major deformities or cyanosis. NEUROLOGIC: No focal weakness or paresthesias are detected. SKIN: Ulcer between left 1st and 2nd toe as pictured PSYCHIATRIC: The patient has a normal affect.        DATA:    ABIs on 09/29/2021 are 0.58 on the right dampened monophasic and 0.63 on the left dampened monophasic.  Toe pressure on the left was 0.   Assessment/Plan:   76 year old male with history of lifestyle-limiting claudication in the left lower extremity that vascular surgery was consulted for left common femoral endarterectomy last month after  angiogram by Dr. Allyson Sabal.  Since evaluation in the hospital he has evidence of new tissue loss between the left first and second toes that likely are ischemic.  He has a toe pressure of 0 on the left.  I discussed the option of left common femoral endarterectomy with likely added common femoral to above-knee popliteal bypass to get him inline flow and optimize his wound healing.  Discussed this should also alleviate his lifestyle limiting claudication and numbness in his left foot.  We will get scheduled for Monday, 12/12/2021.  Have asked that he put Betadine paint on the wound.  Risk benefits of surgery discussed.     Aaron Shelling, MD [facility] Office: [phone]

## 2021-12-09 NOTE — Progress Notes (Signed)
EKG reviewed by drs fitzgerald + Hollis , no orders given as there was no appreciable change from preop. Decision made to give CaCl d/t units of blood given ; administered by dr Smith Robert.

## 2021-12-09 NOTE — Op Note (Signed)
Date: December 09, 2021  Preoperative diagnosis: Critical limb ischemia of the left lower extremity with tissue loss  Postoperative diagnosis: Same  Procedure: 1.  Left common femoral endarterectomy including endarterectomy of the distal external iliac artery and profunda with bovine pericardial patch angioplasty 2.  Left common femoral to above-knee popliteal bypass with 6 mm ringed PTFE 3.  Left iliac arteriogram  4.  Angioplasty and stent of the left common iliac artery (7 mm x 39 mm VBX) 5.  Angioplasty and stent of the left external iliac artery (7 mm x 100 mm drug-coated Eluvia post-dilated with 6 mm Mustang) 6.  Left lower extremity arteriogram  Surgeon: Dr. Marty Heck, MD  Assistant: Dr. Marjean Donna, MD and Vicente Serene, [place]  Indications: 76 year old male who vascular surgery was consulted for left common femoral endarterectomy for lifestyle limiting claudication in the left lower extremity and he has been followed by Dr. Gwenlyn Found.  He presented to the office for follow-up to discuss surgical intervention and developed a wound on his left foot with a toe pressure of 0 progressing to critical limb ischemia with tissue loss.  He presents today after risks and benefits discussed.  An assistant was needed for exposure and to expedite the case and for the complexity including endarterectomy, patch angioplasty, wire exchange for stenting, and sewing the bypass.    Findings: Transverse incision in the left groin and the left common femoral artery was exposed.  This was circumferentially calcified and heavily diseased with no femoral pulse.  I tried to clamp a soft spot on the external iliac artery for inflow control and still had pulsatile inflow.  Ultimately I had to use a #4 Fogarty balloon for proximal control.  Endarterectomy was then performed of the distal external iliac, common femoral as well as the profunda.  A bovine pericardial patch was then sewn to the left common femoral artery.   Once we came off clamp there was pretty profuse bleeding from the profunda where the artery was heavily diseased and endarterectomy had been performed.  This was repaired with multiple sutures including pledgets.  Common femoral pulse was not very good.  We then sewed a common femoral to above-knee popliteal bypass with 6 mm ringed PTFE given no good vein visualized on ultrasound.  I then accessed the patch retrograde and left iliac arteriogram was performed given we could not find any signals in the foot.  This required stenting of the left common and external iliac artery for multilevel high-grade stenosis >80% throughout both vessels.  A 7 mm x 39 mm VBX was placed in the left common iliac artery and a 7 mm x 100 mm drug-coated Eluvia was placed in the left external iliac artery.  Much better pulse left femoral pulse.  We then shot a left lower extremity arteriogram that showed a widely patent left common femoral to above knee popliteal bypass with a patent trifurcation and dominant runoff through the PT into the foot.  Anesthesia: General  EBL: 2.5 L (resuscitation 5 u pRBC and 1  uFFP)  Details: Patient was taken to the operating room after informed consent was obtained.  Placed on the operative table in supine position.  General endotracheal anesthesia was induced.  Ultimately used ultrasound and evaluated for saphenous vein conduit in the left thigh and calf and unfortunately this was too small.  The left groin and left leg were then prepped and draped in standard sterile fashion.  Antibiotics were given and timeout performed.  Initially  made a transverse incision in the left groin above the inguinal crease and dissected down through the subcutaneous tissue and opened the femoral sheath and dissected out the common femoral as well as the SFA and profunda and under the inguinal ligament the external iliac artery.  All these branches were controlled with Vesseloops.  The artery was heavily calcified and  there was no pulse.  While I dissected this Dr. Stanford Breed dissected out the above-knee popliteal target using longitudinal incision anterior to the sartorius just above the knee on the medial side and ultimately dissected out the above-knee popliteal target and controlled with Vesseloops after it was dissected out.  A large GORE tunneler was then tunneled subfascial subsartorial from the above-knee popliteal artery to the groin in anatomic fashion and passed a 6 mm ringed PTFE graft.  The patient was given 100 units/kg IV heparin.  ACT was checked to maintain greater than 250.  I then controlled the SFA profunda with Vesseloops and the distal external iliac artery with a Henley clamp where it felt soft and amenable to clamping.  When we opened the left common femoral artery there was brisk bleeding and we used digital control and ultimately got a #4 Fogarty and had to use balloon control.  We used a Transport planner and had some trouble advancing this retrograde given significant iliac disease.  Endarterectomy was then performed of the left leg common femoral artery as well as the profunda and external iliac artery.  The artery was very diseased and quite friable.  A bBovine pericardial patch was then sewn to the left common femoral artery.  While we were sewing the patch angioplasty, Dr. Stanford Breed opened the above knee popliteal artery with 11 blade scalpel and sewed the distal end to side anastomosis of the graft with 6-0 Prolene parachute technique.  In the groin, once we came off clamps with the patch angioplasty there was pretty profuse  bleeding from the profunda but we had performed endarterectomy.  The artery was very thin here and diseased.  We performed multiple suture repair ultimately require pledget repair as this was quite friable.  At that point time once we established reasonable hemostasis we then pulled the graft to the appropriate length in the groin and an end to side anastomosis was sewn to the left common  femoral artery patch.  We still had a very weak femoral pulse.  There was no signal in the left foot even through we had a triphasic signal in the above knee popliteal artery distal to the graft.  We then accessed left common femoral patch retrograde with micro access needle and placed a microwire and then a microsheath then I advanced a Glidewire advantage retrograde into the aorta and placed a 7 [place] long sheath.  Initially I was concerned about inflow given he did have significant iliac disease.  Iliac arteriogram showed multiple high-grade stenosis in the left common and external iliac artery greater than 80% in multiple segment disease.  I then elected to stent the left common iliac artery with a 7 mm x 39 mm VBX stent to nominal pressures.  I then shot another image that showed no extravasation with a widely patent stent.  I then stented the left external iliac artery with a 7 mm x 100 mm drug-coated Eluvia postdilated with a 6 mm Mustang balloon as there was some stenosis in multiple segments just proximal to patch angioplasty.  We immediately had a much better femoral pulse.  I then shot left  lower extremity runoff that showed a patent common femoral to above-knee popliteal bypass with a patent profunda.  The trifurcation was patent and dominant flow to the foot through the PT.  We could now find a doppler signal at the ankle.  The sheath was removed from the left femoral patch and repaired with 5-0 Prolene. Protamine was given for reversal.  We used surgicel powder and surgiflow for hemostasis.  The groin incision was closed in multiple layers of 2-0 Vicryl, 3-0 Monocryl, 4-0 Monocryl and Dermabond.  The above-knee popliteal target was closed in multiple layers of 2-0 Vicryl, 3-0 Vicryl, 4-0 Monocryl and Dermabond.  Taken to recovery in stable condition.  Complications: None  Condition: Stable  Marty Heck, MD [facility] [place] Office:  [place]

## 2021-12-09 NOTE — Progress Notes (Signed)
VASCULAR SURGERY:  Patient had a transient drop in his blood pressure in the recovery room and quickly responded to fluid and blood.  His abdomen remains soft and his left groin has no hematoma.  The left foot is warm and well-perfused with good Doppler signals.  He has been weaned off his neo and his blood pressure remained stable.   His hemoglobin at 5:30 was 8.4.  I have follow-up labs ordered for the morning.  I have updated the wife who is present at the bedside.  Gae Gallop, MD 7:09 PM

## 2021-12-09 NOTE — Progress Notes (Signed)
Discusssion per drs fitzgerald, mda + Gae Gallop MD/vascular. Will admit icu, dr Scot Dock to follow after seeing pt in pacu, agrees with plan on rbc's and ffp. EKG o to be done and troponins sent.

## 2021-12-09 NOTE — Anesthesia Procedure Notes (Signed)
Central Venous Catheter Insertion Performed by: Roderic Palau, MD, anesthesiologist Start/End8/08/2021 3:30 PM, 12/09/2021 3:45 PM Patient location: Pre-op. Preanesthetic checklist: patient identified, IV checked, site marked, risks and benefits discussed, surgical consent, monitors and equipment checked, pre-op evaluation, timeout performed and anesthesia consent Position: Trendelenburg Lidocaine 1% used for infiltration and patient sedated Hand hygiene performed , maximum sterile barriers used  and Seldinger technique used Catheter size: 8 Fr Total catheter length 16. Central line was placed.Double lumen Procedure performed without using ultrasound guided technique. Attempts: 1 Following insertion, dressing applied, line sutured and Biopatch. Post procedure assessment: blood return through all ports  Patient tolerated the procedure well with no immediate complications.

## 2021-12-09 NOTE — Anesthesia Procedure Notes (Signed)
Arterial Line Insertion Start/End8/08/2021 7:15 AM, 12/09/2021 7:30 AM Performed by: Valda Favia, CRNA, CRNA  Patient location: Pre-op. Preanesthetic checklist: patient identified, IV checked, site marked, risks and benefits discussed, surgical consent, monitors and equipment checked, pre-op evaluation, timeout performed and anesthesia consent Lidocaine 1% used for infiltration Left, radial was placed Catheter size: 20 G Hand hygiene performed  and maximum sterile barriers used  Allen's test indicative of satisfactory collateral circulation Attempts: 2 Procedure performed without using ultrasound guided technique. Following insertion, Biopatch and dressing applied. Post procedure assessment: unchanged  Patient tolerated the procedure well with no immediate complications.

## 2021-12-09 NOTE — Anesthesia Procedure Notes (Signed)
Procedure Name: Intubation Date/Time: 12/09/2021 7:54 AM  Performed by: Maude Leriche, CRNAPre-anesthesia Checklist: Patient identified, Emergency Drugs available, Suction available and Patient being monitored Patient Re-evaluated:Patient Re-evaluated prior to induction Oxygen Delivery Method: Circle system utilized Preoxygenation: Pre-oxygenation with 100% oxygen Induction Type: IV induction Ventilation: Mask ventilation without difficulty Laryngoscope Size: Miller and 2 Grade View: Grade I Tube type: Oral Tube size: 7.5 mm Number of attempts: 1 Airway Equipment and Method: Bite block Placement Confirmation: ETT inserted through vocal cords under direct vision, positive ETCO2 and breath sounds checked- equal and bilateral Secured at: 22 cm Tube secured with: Tape Dental Injury: Teeth and Oropharynx as per pre-operative assessment

## 2021-12-09 NOTE — Progress Notes (Signed)
Report given to 4e rn and upon ready to leave pacu, sbp '@107'$  with a mean 52. Dr Jefm Petty at bedside, decision to initiate dopamineb at fixed rate prior to going to floor. Dopamine initiated at 82mg, when bp dropped to 50's by both cuff and aline. At thispoint flds opened up and decision to place l scl line, give add'l unit prbc's + add;l unit ffp. ( See note by anestesiologist re: line).

## 2021-12-10 LAB — POCT ACTIVATED CLOTTING TIME
Activated Clotting Time: 143 seconds
Activated Clotting Time: 221 seconds
Activated Clotting Time: 233 seconds
Activated Clotting Time: 239 seconds
Activated Clotting Time: 245 seconds
Activated Clotting Time: 251 seconds
Activated Clotting Time: 215 seconds
Activated Clotting Time: 239 seconds
Activated Clotting Time: 251 seconds
Activated Clotting Time: 137 seconds

## 2021-12-10 LAB — BPAM FFP
Blood Product Expiration Date: 202308092359
Blood Product Expiration Date: 202308092359
ISSUE DATE / TIME: 202308041329
ISSUE DATE / TIME: 202308041556
Unit Type and Rh: 7300
Unit Type and Rh: 7300

## 2021-12-10 LAB — POCT I-STAT 7, (LYTES, BLD GAS, ICA,H+H)
Acid-base deficit: 2 mmol/L (ref 0.0–2.0)
Acid-base deficit: 6 mmol/L — ABNORMAL HIGH (ref 0.0–2.0)
Acid-base deficit: 5 mmol/L — ABNORMAL HIGH (ref 0.0–2.0)
Acid-base deficit: 6 mmol/L — ABNORMAL HIGH (ref 0.0–2.0)
Bicarbonate: 23.7 mmol/L (ref 20.0–28.0)
Bicarbonate: 19.7 mmol/L — ABNORMAL LOW (ref 20.0–28.0)
Bicarbonate: 21.8 mmol/L (ref 20.0–28.0)
Bicarbonate: 21.1 mmol/L (ref 20.0–28.0)
Calcium, Ion: 1.22 mmol/L (ref 1.15–1.40)
Calcium, Ion: 1.13 mmol/L — ABNORMAL LOW (ref 1.15–1.40)
Calcium, Ion: 1.05 mmol/L — ABNORMAL LOW (ref 1.15–1.40)
Calcium, Ion: 1.05 mmol/L — ABNORMAL LOW (ref 1.15–1.40)
HCT: 22 % — ABNORMAL LOW (ref 39.0–52.0)
HCT: 15 % — ABNORMAL LOW (ref 39.0–52.0)
HCT: 19 % — ABNORMAL LOW (ref 39.0–52.0)
HCT: 27 % — ABNORMAL LOW (ref 39.0–52.0)
Hemoglobin: 7.5 g/dL — ABNORMAL LOW (ref 13.0–17.0)
Hemoglobin: 5.1 g/dL — CL (ref 13.0–17.0)
Hemoglobin: 6.5 g/dL — CL (ref 13.0–17.0)
Hemoglobin: 9.2 g/dL — ABNORMAL LOW (ref 13.0–17.0)
O2 Saturation: 99 %
O2 Saturation: 100 %
O2 Saturation: 100 %
O2 Saturation: 100 %
Patient temperature: 36.9
Patient temperature: 37.1
Potassium: 4.1 mmol/L (ref 3.5–5.1)
Potassium: 4.3 mmol/L (ref 3.5–5.1)
Potassium: 4.2 mmol/L (ref 3.5–5.1)
Potassium: 4.4 mmol/L (ref 3.5–5.1)
Sodium: 139 mmol/L (ref 135–145)
Sodium: 139 mmol/L (ref 135–145)
Sodium: 139 mmol/L (ref 135–145)
Sodium: 140 mmol/L (ref 135–145)
TCO2: 25 mmol/L (ref 22–32)
TCO2: 21 mmol/L — ABNORMAL LOW (ref 22–32)
TCO2: 23 mmol/L (ref 22–32)
TCO2: 22 mmol/L (ref 22–32)
pCO2 arterial: 45.6 mmHg (ref 32–48)
pCO2 arterial: 39.4 mmHg (ref 32–48)
pCO2 arterial: 49.9 mmHg — ABNORMAL HIGH (ref 32–48)
pCO2 arterial: 45.8 mmHg (ref 32–48)
pH, Arterial: 7.324 — ABNORMAL LOW (ref 7.35–7.45)
pH, Arterial: 7.307 — ABNORMAL LOW (ref 7.35–7.45)
pH, Arterial: 7.248 — ABNORMAL LOW (ref 7.35–7.45)
pH, Arterial: 7.271 — ABNORMAL LOW (ref 7.35–7.45)
pO2, Arterial: 130 mmHg — ABNORMAL HIGH (ref 83–108)
pO2, Arterial: 207 mmHg — ABNORMAL HIGH (ref 83–108)
pO2, Arterial: 377 mmHg — ABNORMAL HIGH (ref 83–108)
pO2, Arterial: 392 mmHg — ABNORMAL HIGH (ref 83–108)

## 2021-12-10 LAB — CBC
HCT: 20.7 % — ABNORMAL LOW (ref 39.0–52.0)
HCT: 23.7 % — ABNORMAL LOW (ref 39.0–52.0)
Hemoglobin: 7.2 g/dL — ABNORMAL LOW (ref 13.0–17.0)
Hemoglobin: 8.4 g/dL — ABNORMAL LOW (ref 13.0–17.0)
MCH: 30.4 pg (ref 26.0–34.0)
MCH: 31.5 pg (ref 26.0–34.0)
MCHC: 34.8 g/dL (ref 30.0–36.0)
MCHC: 35.4 g/dL (ref 30.0–36.0)
MCV: 87.3 fL (ref 80.0–100.0)
MCV: 88.8 fL (ref 80.0–100.0)
Platelets: 108 10*3/uL — ABNORMAL LOW (ref 150–400)
Platelets: 100 10*3/uL — ABNORMAL LOW (ref 150–400)
RBC: 2.37 MIL/uL — ABNORMAL LOW (ref 4.22–5.81)
RBC: 2.67 MIL/uL — ABNORMAL LOW (ref 4.22–5.81)
RDW: 20.5 % — ABNORMAL HIGH (ref 11.5–15.5)
RDW: 18.2 % — ABNORMAL HIGH (ref 11.5–15.5)
WBC: 10.6 10*3/uL — ABNORMAL HIGH (ref 4.0–10.5)
WBC: 13.2 10*3/uL — ABNORMAL HIGH (ref 4.0–10.5)
nRBC: 1.5 % — ABNORMAL HIGH (ref 0.0–0.2)
nRBC: 0.7 % — ABNORMAL HIGH (ref 0.0–0.2)

## 2021-12-10 LAB — PREPARE FRESH FROZEN PLASMA
Unit division: 0
Unit division: 0

## 2021-12-10 LAB — BASIC METABOLIC PANEL
Anion gap: 8 (ref 5–15)
BUN: 27 mg/dL — ABNORMAL HIGH (ref 8–23)
CO2: 21 mmol/L — ABNORMAL LOW (ref 22–32)
Calcium: 7.8 mg/dL — ABNORMAL LOW (ref 8.9–10.3)
Chloride: 108 mmol/L (ref 98–111)
Creatinine, Ser: 1.21 mg/dL (ref 0.61–1.24)
GFR, Estimated: >60 mL/min (ref >60)
Glucose, Bld: 120 mg/dL — ABNORMAL HIGH (ref 70–99)
Potassium: 3.9 mmol/L (ref 3.5–5.1)
Sodium: 137 mmol/L (ref 135–145)

## 2021-12-10 LAB — APTT: aPTT: 33 seconds (ref 24–36)

## 2021-12-10 LAB — LIPID PANEL
Cholesterol: 50 mg/dL (ref 0–200)
HDL: 22 mg/dL — ABNORMAL LOW (ref >40)
LDL Cholesterol: 15 mg/dL (ref 0–99)
Total CHOL/HDL Ratio: 2.3 RATIO
Triglycerides: 67 mg/dL (ref <150)
VLDL: 13 mg/dL (ref 0–40)

## 2021-12-10 LAB — PREPARE RBC (CROSSMATCH)

## 2021-12-10 MED ORDER — DEFERASIROX 180 MG PO TABS
4.0000 | ORAL_TABLET | ORAL | Status: DC
Start: 2021-12-11 — End: 2021-12-13
  Administered 2021-12-11 – 2021-12-13 (×3): 4 via ORAL
  Filled 2021-12-10 (×6): qty 4

## 2021-12-10 NOTE — Progress Notes (Signed)
PHARMACIST LIPID MONITORING   Aaron Sexton is a 76 y.o. male admitted on 12/09/2021 with critical limb ischemia.  Pharmacy has been consulted to optimize lipid-lowering therapy with the indication of secondary prevention for clinical ASCVD.  Recent Labs:  Lipid Panel (last 6 months):   Lab Results  Component Value Date   CHOL 50 12/10/2021   TRIG 67 12/10/2021   HDL 22 (L) 12/10/2021   CHOLHDL 2.3 12/10/2021   VLDL 13 12/10/2021   LDLCALC 15 12/10/2021    Hepatic function panel (last 6 months):   Lab Results  Component Value Date   AST 16 12/09/2021   ALT 14 12/09/2021   ALKPHOS 40 12/09/2021   BILITOT 0.6 12/09/2021    SCr (since admission):   Serum creatinine: 1.21 mg/dL 12/10/21 0314 Estimated creatinine clearance: 55.3 mL/min  Current therapy and lipid therapy tolerance Current lipid-lowering therapy: pravastatin 40 mg daily Previous lipid-lowering therapies (if applicable): none Documented or reported allergies or intolerances to lipid-lowering therapies (if applicable): pt reports no intolerances to lipid medications  Assessment:   Patient prefers no changes in lipid-lowering therapy at this time due to recently filling pravastatin and his lipid panel being WNL.  Plan:    1.Statin intensity (high intensity recommended for all patients regardless of the LDL):  No statin changes. See above. Pt has indication for increase to high intensity statin given clinical ASCVD, however prefers to follow up outpatient with [facility].  2.Add ezetimibe (if any one of the following):   Not indicated at this time.  3.Refer to lipid clinic:   No  4.Follow-up with:  Primary care provider - Venia Carbon, MD  5.Follow-up labs after discharge:  No changes in lipid therapy, repeat a lipid panel in one year.     Ursula Beath, PharmD 12/10/2021, 9:50 AM

## 2021-12-10 NOTE — Evaluation (Signed)
Physical Therapy Evaluation Patient Details Name: Aaron Sexton MRN: [id number] DOB: 1945-10-04 Today's Date: 12/10/2021  History of Present Illness  Pt is a 76 y.o. male admitted 12/09/21 for same day L femoral endarterectomy, femoral-popliteal bypass. Post-op course complicated by hypotension. PMH includes PVD, HTN, HLD, cerebrovascular disease, ASCVD, OA.   Clinical Impression  Pt presents with an overall decrease in functional mobility secondary to above. PTA, pt mod indep with SPC and rollator, lives with spouse, drives. Initiated educ re: precautions, positioning, activity recommendations, and importance of mobility. Today, pt able to stand and take a few steps with RW, requiring up to modA; pt adamant against ambulation secondary to LLE pain requiring return to sit. Hopeful pt will progress well with mobility once pain better controlled. Pt would benefit from continued acute PT services to maximize functional mobility and independence prior to d/c with HHPT services.     SpO2 93% on RA, HR 69, BP 116/46    Recommendations for follow up therapy are one component of a multi-disciplinary discharge planning process, led by the attending physician.  Recommendations may be updated based on patient status, additional functional criteria and insurance authorization.  Follow Up Recommendations Home health PT      Assistance Recommended at Discharge Frequent or constant Supervision/Assistance  Patient can return home with the following  A little help with walking and/or transfers;A little help with bathing/dressing/bathroom;Assistance with cooking/housework;Assist for transportation;Help with stairs or ramp for entrance    Equipment Recommendations None recommended by PT  Recommendations for Other Services      Functional Status Assessment Patient has had a recent decline in their functional status and demonstrates the ability to make significant improvements in function in a reasonable and  predictable amount of time.     Precautions / Restrictions Precautions Precautions: Fall Restrictions Weight Bearing Restrictions: No      Mobility  Bed Mobility Overal bed mobility: Needs Assistance Bed Mobility: Supine to Sit     Supine to sit: Mod assist, HOB elevated     General bed mobility comments: ModA for LLE management and trunk elevation, increased time and effort with cues for sequencing    Transfers Overall transfer level: Needs assistance   Transfers: Sit to/from Stand Sit to Stand: Min assist, From elevated surface           General transfer comment: increased time and effort, cues for BUE/BLE placement prior to standing; minA for trunk elevation    Ambulation/Gait Ambulation/Gait assistance: Min assist           Pre-gait activities: able to take side steps with RW towards HOB with minA, increased time and effort but ultimately able to take complete steps; pt adamantly declining further mobility secondary to LLE pain    Stairs            Wheelchair Mobility    Modified Rankin (Stroke Patients Only)       Balance Overall balance assessment: Needs assistance Sitting-balance support: No upper extremity supported, Feet supported Sitting balance-Leahy Scale: Good     Standing balance support: Reliant on assistive device for balance Standing balance-Leahy Scale: Poor Standing balance comment: heavy reliance on RW to offload painful LLE                             Pertinent Vitals/Pain Pain Assessment Pain Assessment: Faces Faces Pain Scale: Hurts whole lot Pain Location: LLE Pain Descriptors / Indicators: Grimacing, Guarding, Moaning Pain  Intervention(s): Monitored during session, Limited activity within patient's tolerance    Home Living Family/patient expects to be discharged to:: Private residence Living Arrangements: Spouse/significant other Available Help at Discharge: Family;Available 24 hours/day Type of  Home: House Home Access: Stairs to enter Entrance Stairs-Rails: Can reach both Entrance Stairs-Number of Steps: 2   Home Layout: One level Home Equipment: Rollator (4 wheels);Hand held shower head;Grab bars - tub/shower;Shower Counsellor (2 wheels)      Prior Function Prior Level of Function : Independent/Modified Independent;Driving             Mobility Comments: mod indep ambulating with rollator in house; uses [facility] for community mobility ADLs Comments: Ind with ADL/iADL, continues to drive, and is hoping to get back to cutting the grass     Hand Dominance   Dominant Hand: Right    Extremity/Trunk Assessment   Upper Extremity Assessment Upper Extremity Assessment: Overall WFL for tasks assessed    Lower Extremity Assessment Lower Extremity Assessment: LLE deficits/detail LLE Deficits / Details: s/p LLE revascularization with significant post-op pain limiting AROM and strength       Communication   Communication: No difficulties  Cognition Arousal/Alertness: Awake/alert Behavior During Therapy: WFL for tasks assessed/performed Overall Cognitive Status: Within Functional Limits for tasks assessed                                          General Comments      Exercises General Exercises - Lower Extremity Long Arc Quad: AAROM, Left, Seated   Assessment/Plan    PT Assessment Patient needs continued PT services  PT Problem List Decreased strength;Decreased range of motion;Decreased activity tolerance;Decreased balance;Decreased mobility;Decreased knowledge of use of DME;Decreased knowledge of precautions;Pain       PT Treatment Interventions DME instruction;Gait training;Stair training;Functional mobility training;Therapeutic activities;Therapeutic exercise;Balance training;Patient/family education    PT Goals (Current goals can be found in the Care Plan section)  Acute Rehab PT Goals Patient Stated Goal: decreased pain, get back to  mowing grass PT Goal Formulation: With patient Time For Goal Achievement: 12/24/21 Potential to Achieve Goals: Good    Frequency Min 3X/week     Co-evaluation               AM-PAC PT "6 Clicks" Mobility  Outcome Measure Help needed turning from your back to your side while in a flat bed without using bedrails?: A Lot Help needed moving from lying on your back to sitting on the side of a flat bed without using bedrails?: A Lot Help needed moving to and from a bed to a chair (including a wheelchair)?: A Little Help needed standing up from a chair using your arms (e.g., wheelchair or bedside chair)?: A Lot Help needed to walk in hospital room?: Total Help needed climbing 3-5 steps with a railing? : Total 6 Click Score: 11    End of Session Equipment Utilized During Treatment: Gait belt Activity Tolerance: Patient limited by pain Patient left: in bed;with call bell/phone within reach;with bed alarm set (seated EOB to eat lunch) Nurse Communication: Mobility status PT Visit Diagnosis: Other abnormalities of gait and mobility (R26.89);Muscle weakness (generalized) (M62.81);Pain Pain - Right/Left: Left Pain - part of body: Leg    Time: 1209-1229 PT Time Calculation (min) (ACUTE ONLY): 20 min   Charges:   PT Evaluation $PT Eval Moderate Complexity: 1 Mod  Ina Homes, PT, DPT Acute Rehabilitation Services  Personal: Secure Chat Rehab Office: [phone]  Malachy Chamber 12/10/2021, 1:58 PM

## 2021-12-10 NOTE — Evaluation (Signed)
Occupational Therapy Evaluation Patient Details Name: Aaron Sexton MRN: [id number] DOB: Dec 01, 1945 Today's Date: 12/10/2021   History of Present Illness Pt is a 76 y.o. male admitted 12/09/21 for same date L femoral endarterectomy, femoral-popliteal bypass. Post-op course complicated by hypotension. PMH includes PVD, HTN, HLD, cerebrovascular disease, OA.   Clinical Impression   Patient admitted for the diagnosis and procedure above.  PTA he lives with his spouse, uses a 4WRW in the home, and did not need assist with ADL/iADL.  Pain to his L leg is the primary deficit.  Currently he is needing up to Mod A for bed mobility and standing, but only Min A for transfers at [facility] level.  OT will follow in the acute setting, but it appears he will be able to transition home with possible HH OT.        Recommendations for follow up therapy are one component of a multi-disciplinary discharge planning process, led by the attending physician.  Recommendations may be updated based on patient status, additional functional criteria and insurance authorization.   Follow Up Recommendations  Home health OT    Assistance Recommended at Discharge Intermittent Supervision/Assistance  Patient can return home with the following A little help with walking and/or transfers;A little help with bathing/dressing/bathroom;Assist for transportation    Functional Status Assessment  Patient has had a recent decline in their functional status and demonstrates the ability to make significant improvements in function in a reasonable and predictable amount of time.  Equipment Recommendations  None recommended by OT    Recommendations for Other Services       Precautions / Restrictions Precautions Precautions: Fall Restrictions Weight Bearing Restrictions: No      Mobility Bed Mobility Overal bed mobility: Needs Assistance Bed Mobility: Supine to Sit     Supine to sit: Mod assist, HOB elevated           Transfers Overall transfer level: Needs assistance   Transfers: Sit to/from Stand, Bed to chair/wheelchair/BSC Sit to Stand: Mod assist     Step pivot transfers: Min assist            Balance Overall balance assessment: Needs assistance Sitting-balance support: Feet supported, Single extremity supported Sitting balance-Leahy Scale: Good     Standing balance support: Reliant on assistive device for balance Standing balance-Leahy Scale: Poor                             ADL either performed or assessed with clinical judgement   ADL       Grooming: Wash/dry hands;Wash/dry face;Set up;Sitting           Upper Body Dressing : Sitting;Minimal assistance Upper Body Dressing Details (indicate cue type and reason): lines Lower Body Dressing: Moderate assistance;Sit to/from stand   Toilet Transfer: BSC/3in1;Stand-pivot;Minimal assistance                   Vision Baseline Vision/History: 1 Wears glasses Patient Visual Report: No change from baseline       Perception Perception Perception: Within Functional Limits   Praxis Praxis Praxis: Intact    Pertinent Vitals/Pain Pain Assessment Pain Assessment: Faces Faces Pain Scale: Hurts even more Pain Location: L leg Pain Descriptors / Indicators: Grimacing, Guarding Pain Intervention(s): Monitored during session     Hand Dominance Right   Extremity/Trunk Assessment Upper Extremity Assessment Upper Extremity Assessment: Overall WFL for tasks assessed   Lower Extremity Assessment Lower Extremity Assessment: Defer  to PT evaluation   Cervical / Trunk Assessment Cervical / Trunk Assessment: Normal   Communication Communication Communication: No difficulties   Cognition Arousal/Alertness: Awake/alert Behavior During Therapy: WFL for tasks assessed/performed Overall Cognitive Status: Within Functional Limits for tasks assessed                                       General  Comments   VVS on RA    Exercises     Shoulder Instructions      Home Living Family/patient expects to be discharged to:: Private residence Living Arrangements: Spouse/significant other Available Help at Discharge: Family;Available 24 hours/day Type of Home: House Home Access: Stairs to enter [employer] of Steps: 2 Entrance Stairs-Rails: Can reach both Home Layout: One level     Bathroom Shower/Tub: Tub/shower unit;Walk-in shower   Bathroom Toilet: Standard Bathroom Accessibility: Yes How Accessible: Accessible via walker Home Equipment: Rollator (4 wheels);Hand held shower head;Grab bars - tub/shower;Shower seat          Prior Functioning/Environment Prior Level of Function : Independent/Modified Independent;Driving             Mobility Comments: Uses 4WRW in th home, and SPC to go to church ADLs Comments: Ind with ADL/iADL, continues to drive, and is hoping to get back to cutting the grass        OT Problem List: Decreased range of motion;Decreased activity tolerance;Impaired balance (sitting and/or standing);Pain      OT Treatment/Interventions: Self-care/ADL training;Therapeutic activities;Patient/family education;DME and/or AE instruction;Balance training    OT Goals(Current goals can be found in the care plan section) Acute Rehab OT Goals Patient Stated Goal: Return home OT Goal Formulation: With patient Time For Goal Achievement: 12/23/21 Potential to Achieve Goals: Good ADL Goals Pt Will Perform Grooming: with modified independence;standing Pt Will Perform Lower Body Dressing: with modified independence;sit to/from stand;with adaptive equipment Pt Will Transfer to Toilet: with modified independence;ambulating;regular height toilet  OT Frequency: Min 2X/week    Co-evaluation              AM-PAC OT "6 Clicks" Daily Activity     Outcome Measure Help from another person eating meals?: None Help from another person taking care of  personal grooming?: None Help from another person toileting, which includes using toliet, bedpan, or urinal?: A Little Help from another person bathing (including washing, rinsing, drying)?: A Lot Help from another person to put on and taking off regular upper body clothing?: A Little Help from another person to put on and taking off regular lower body clothing?: A Lot 6 Click Score: 18   End of Session Equipment Utilized During Treatment: Rolling walker (2 wheels) Nurse Communication: Mobility status  Activity Tolerance: Patient tolerated treatment well Patient left: in chair;with call bell/phone within reach  OT Visit Diagnosis: Unsteadiness on feet (R26.81);Pain Pain - Right/Left: Left Pain - part of body: Leg                Time: 0821-0852 OT Time Calculation (min): 31 min Charges:  OT General Charges $OT Visit: 1 Visit OT Evaluation $OT Eval Moderate Complexity: 1 Mod OT Treatments $Therapeutic Activity: 8-22 mins  12/10/2021  RP, OTR/L  Acute Rehabilitation Services  Office:  [phone]   Suzanna Obey 12/10/2021, 9:01 AM

## 2021-12-10 NOTE — Progress Notes (Signed)
   VASCULAR SURGERY ASSESSMENT & PLAN:   POD 1 LEFT CFE - FEM AK POP - PTA/STENT CIA/EIA: He has excellent Doppler signals in the left foot.  His incisions look fine.  ACUTE BLOOD LOSS ANEMIA: His hemoglobin has drifted down to 7.2.  I am giving him 2 units of blood this morning.  His abdomen is completely soft and there is no evidence of hematoma in the groin.  I think we are just catching up from his blood loss yesterday.  His coags are normal.  DVT PROPHYLAXIS: I will start his subcu heparin tomorrow.  VASCULAR QUALITY INITIATIVE: He is on aspirin, Plavix, and a statin.  Transferred to 4 E.    SUBJECTIVE:   No complaints this morning.  He feels well.  PHYSICAL EXAM:   Vitals:   12/10/21 0500 12/10/21 0505 12/10/21 0515 12/10/21 0520  BP: (!) 109/48 (!) 109/48 (!) 105/49   Pulse: 71 74 76 75  Resp: (!) '9 17 15 '$ (!) 23  Temp:  100.3 F (37.9 C) 99.4 F (37.4 C) 99.4 F (37.4 C)  TempSrc:  Axillary Axillary Axillary  SpO2: 96% 96% 93% 97%  Weight:      Height:       Lungs are clear. Abdomen is soft and nontender. His incisions look fine. He has brisk Doppler signals in the left foot in the dorsalis pedis and posterior tibial positions.  He has mild left lower extremity swelling.  LABS:   Lab Results  Component Value Date   WBC 10.6 (H) 12/10/2021   HGB 7.2 (L) 12/10/2021   HCT 20.7 (L) 12/10/2021   MCV 87.3 12/10/2021   PLT 108 (L) 12/10/2021   Lab Results  Component Value Date   CREATININE 1.21 12/10/2021   Lab Results  Component Value Date   INR 1.1 12/09/2021   CBG (last 3)  No results for input(s): "GLUCAP" in the last 72 hours.  PROBLEM LIST:    Principal Problem:   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) Active Problems:   PAD (peripheral artery disease) (HCC)   CURRENT MEDS:    aspirin  81 mg Oral Daily   Chlorhexidine Gluconate Cloth  6 each Topical Daily   Chlorhexidine Gluconate Cloth  6 each Topical Daily   chlorthalidone   25 mg Oral Daily   vitamin B-12  500 mcg Oral Daily   docusate sodium  100 mg Oral Daily   heparin  5,000 Units Subcutaneous Q8H   losartan  25 mg Oral Daily   metoprolol succinate  100 mg Oral Daily   pantoprazole  40 mg Oral Daily   pravastatin  40 mg Oral QHS   sodium chloride flush  10-40 mL Intracatheter Q12H    Deitra Mayo Office: [phone] 12/10/2021

## 2021-12-11 LAB — CBC
HCT: 23 % — ABNORMAL LOW (ref 39.0–52.0)
Hemoglobin: 8.1 g/dL — ABNORMAL LOW (ref 13.0–17.0)
MCH: 31.5 pg (ref 26.0–34.0)
MCHC: 35.2 g/dL (ref 30.0–36.0)
MCV: 89.5 fL (ref 80.0–100.0)
Platelets: 110 10*3/uL — ABNORMAL LOW (ref 150–400)
RBC: 2.57 MIL/uL — ABNORMAL LOW (ref 4.22–5.81)
RDW: 18.2 % — ABNORMAL HIGH (ref 11.5–15.5)
WBC: 13 10*3/uL — ABNORMAL HIGH (ref 4.0–10.5)
nRBC: 0.2 % (ref 0.0–0.2)

## 2021-12-11 LAB — BPAM RBC
Blood Product Expiration Date: 202308292359
Blood Product Expiration Date: 202308312359
Blood Product Expiration Date: 202308312359
Blood Product Expiration Date: 202309012359
Blood Product Expiration Date: 202309022359
Blood Product Expiration Date: 202309022359
Blood Product Expiration Date: 202309042359
Blood Product Expiration Date: 202309042359
ISSUE DATE / TIME: 202308041056
ISSUE DATE / TIME: 202308041208
ISSUE DATE / TIME: 202308041208
ISSUE DATE / TIME: 202308041239
ISSUE DATE / TIME: 202308041239
ISSUE DATE / TIME: 202308041531
ISSUE DATE / TIME: 202308050452
ISSUE DATE / TIME: 202308050452
Unit Type and Rh: 7300
Unit Type and Rh: 7300
Unit Type and Rh: 7300
Unit Type and Rh: 7300
Unit Type and Rh: 7300
Unit Type and Rh: 7300
Unit Type and Rh: 7300
Unit Type and Rh: 7300

## 2021-12-11 LAB — TYPE AND SCREEN
ABO/RH(D): B POS
Antibody Screen: NEGATIVE
Unit division: 0
Unit division: 0
Unit division: 0
Unit division: 0
Unit division: 0
Unit division: 0
Unit division: 0
Unit division: 0

## 2021-12-11 LAB — BASIC METABOLIC PANEL
Anion gap: 7 (ref 5–15)
BUN: 17 mg/dL (ref 8–23)
CO2: 22 mmol/L (ref 22–32)
Calcium: 7.6 mg/dL — ABNORMAL LOW (ref 8.9–10.3)
Chloride: 104 mmol/L (ref 98–111)
Creatinine, Ser: 1.03 mg/dL (ref 0.61–1.24)
GFR, Estimated: >60 mL/min (ref >60)
Glucose, Bld: 105 mg/dL — ABNORMAL HIGH (ref 70–99)
Potassium: 3.2 mmol/L — ABNORMAL LOW (ref 3.5–5.1)
Sodium: 133 mmol/L — ABNORMAL LOW (ref 135–145)

## 2021-12-11 MED ORDER — FUROSEMIDE 10 MG/ML IJ SOLN
20.0000 mg | Freq: Once | INTRAMUSCULAR | Status: AC
  Administered 2021-12-11: 20 mg via INTRAVENOUS
  Filled 2021-12-11: qty 2

## 2021-12-11 MED ORDER — POTASSIUM CHLORIDE CRYS ER 20 MEQ PO TBCR
20.0000 meq | EXTENDED_RELEASE_TABLET | Freq: Once | ORAL | Status: AC
  Administered 2021-12-11: 20 meq via ORAL

## 2021-12-11 MED ORDER — POTASSIUM CHLORIDE CRYS ER 20 MEQ PO TBCR
40.0000 meq | EXTENDED_RELEASE_TABLET | Freq: Once | ORAL | Status: DC
  Filled 2021-12-11: qty 2

## 2021-12-11 NOTE — Progress Notes (Signed)
   VASCULAR SURGERY ASSESSMENT & PLAN:   POD 2 LEFT CFE - FEM AK POP - PTA/STENT CIA/EIA: He has excellent Doppler signals in the left foot.  His incisions look fine.  The knee between the toes is unchanged.   ACUTE BLOOD LOSS ANEMIA: His hemoglobin today is 8.1.  He is 4.6 L positive since admission and I suspect his hemoglobin will improve as he diuresis.  I have ordered him a small dose of IV Lasix this morning.   DVT PROPHYLAXIS: He is on subcu heparin.   VASCULAR QUALITY INITIATIVE: He is on aspirin, Plavix, and a statin.   HYPOKALEMIA: Supplemented potassium.  Transfer to 4 E.     SUBJECTIVE:   No complaints.  PHYSICAL EXAM:   Vitals:   12/11/21 0000 12/11/21 0100 12/11/21 0200 12/11/21 0300  BP: (!) 111/51 (!) 125/52 (!) 125/52 (!) 116/56  Pulse: 79 81 77 82  Resp: (!) 21 (!) 22 (!) 27 (!) 35  Temp:      TempSrc:      SpO2: 92% 92% 94% 93%  Weight:      Height:       Abdomen is soft and nontender. His incisions look fine. Brisk Doppler signals in the left foot. Moderate bilateral lower extremity swelling.  LABS:   Lab Results  Component Value Date   WBC 13.0 (H) 12/11/2021   HGB 8.1 (L) 12/11/2021   HCT 23.0 (L) 12/11/2021   MCV 89.5 12/11/2021   PLT 110 (L) 12/11/2021   Lab Results  Component Value Date   CREATININE 1.03 12/11/2021   Lab Results  Component Value Date   INR 1.1 12/09/2021    PROBLEM LIST:    Principal Problem:   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) Active Problems:   PAD (peripheral artery disease) (HCC)   CURRENT MEDS:    aspirin  81 mg Oral Daily   Chlorhexidine Gluconate Cloth  6 each Topical Daily   Chlorhexidine Gluconate Cloth  6 each Topical Daily   chlorthalidone  25 mg Oral Daily   vitamin B-12  500 mcg Oral Daily   Deferasirox  4 tablet Oral Q24H   docusate sodium  100 mg Oral Daily   heparin  5,000 Units Subcutaneous Q8H   losartan  25 mg Oral Daily   metoprolol succinate  100 mg Oral Daily    pantoprazole  40 mg Oral Daily   pravastatin  40 mg Oral QHS   sodium chloride flush  10-40 mL Intracatheter Q12H    Deitra Mayo Office: [phone] 12/11/2021

## 2021-12-12 ENCOUNTER — Encounter (HOSPITAL_COMMUNITY): Payer: Self-pay | Admitting: Vascular Surgery

## 2021-12-12 LAB — CBC
HCT: 23.1 % — ABNORMAL LOW (ref 39.0–52.0)
Hemoglobin: 7.8 g/dL — ABNORMAL LOW (ref 13.0–17.0)
MCH: 31.2 pg (ref 26.0–34.0)
MCHC: 33.8 g/dL (ref 30.0–36.0)
MCV: 92.4 fL (ref 80.0–100.0)
Platelets: 142 10*3/uL — ABNORMAL LOW (ref 150–400)
RBC: 2.5 MIL/uL — ABNORMAL LOW (ref 4.22–5.81)
RDW: 18.3 % — ABNORMAL HIGH (ref 11.5–15.5)
WBC: 13.1 10*3/uL — ABNORMAL HIGH (ref 4.0–10.5)
nRBC: 0.4 % — ABNORMAL HIGH (ref 0.0–0.2)

## 2021-12-12 LAB — BASIC METABOLIC PANEL
Anion gap: 3 — ABNORMAL LOW (ref 5–15)
BUN: 21 mg/dL (ref 8–23)
CO2: 25 mmol/L (ref 22–32)
Calcium: 7.9 mg/dL — ABNORMAL LOW (ref 8.9–10.3)
Chloride: 106 mmol/L (ref 98–111)
Creatinine, Ser: 1.18 mg/dL (ref 0.61–1.24)
GFR, Estimated: >60 mL/min (ref >60)
Glucose, Bld: 100 mg/dL — ABNORMAL HIGH (ref 70–99)
Potassium: 4 mmol/L (ref 3.5–5.1)
Sodium: 134 mmol/L — ABNORMAL LOW (ref 135–145)

## 2021-12-12 MED ORDER — FAMOTIDINE 20 MG PO TABS
10.0000 mg | ORAL_TABLET | Freq: Every day | ORAL | Status: DC
  Administered 2021-12-12 – 2021-12-13 (×2): 10 mg via ORAL
  Filled 2021-12-12 (×2): qty 1

## 2021-12-12 MED ORDER — CLOPIDOGREL BISULFATE 75 MG PO TABS
75.0000 mg | ORAL_TABLET | Freq: Every day | ORAL | Status: DC
  Administered 2021-12-12 – 2021-12-13 (×2): 75 mg via ORAL
  Filled 2021-12-12 (×2): qty 1

## 2021-12-12 MED ORDER — LOPERAMIDE HCL 2 MG PO CAPS: 2.0000 mg | ORAL_CAPSULE | Freq: Four times a day (QID) | ORAL | Status: DC | PRN

## 2021-12-12 MED ORDER — OMEGA-3-ACID ETHYL ESTERS 1 G PO CAPS
1.0000 g | ORAL_CAPSULE | Freq: Every day | ORAL | Status: DC
  Administered 2021-12-12 – 2021-12-13 (×2): 1 g via ORAL
  Filled 2021-12-12 (×2): qty 1

## 2021-12-12 NOTE — Progress Notes (Signed)
Attempted to call report to 4E.  Nurse unavailable to take report at this time.

## 2021-12-12 NOTE — Progress Notes (Signed)
Physical Therapy Treatment Patient Details Name: Aaron Sexton MRN: [id number] DOB: 12/29/1945 Today's Date: 12/12/2021   History of Present Illness 76 y.o. male admitted 12/09/21 for same day L femoral endarterectomy, femoral-popliteal bypass. Post-op course complicated by hypotension. PMH includes PVD, HTN, HLD, cerebrovascular disease, ASCVD, OA.    PT Comments    Pt very pleasant and able to progress gait distance this session with initial difficulty clearing LLE but improved with increased distance and trials. Pt educated for LLE strengthening, ROM and progressive gait. Pt encouraged to be OOB throughout day and work toward increased gait distance, pt relies on seat of upright walker at baseline. Will continue to follow.      Recommendations for follow up therapy are one component of a multi-disciplinary discharge planning process, led by the attending physician.  Recommendations may be updated based on patient status, additional functional criteria and insurance authorization.  Follow Up Recommendations  Home health PT     Assistance Recommended at Discharge Frequent or constant Supervision/Assistance  Patient can return home with the following A little help with walking and/or transfers;A little help with bathing/dressing/bathroom;Assistance with cooking/housework;Assist for transportation;Help with stairs or ramp for entrance   Equipment Recommendations  None recommended by PT    Recommendations for Other Services       Precautions / Restrictions Precautions Precautions: Fall     Mobility  Bed Mobility Overal bed mobility: Needs Assistance Bed Mobility: Supine to Sit, Sit to Supine     Supine to sit: Min assist     General bed mobility comments: HOB flat with min assist to elevate trunk from surface, increased time and effort, pt unable to following rolling and rise to side. Return to bed pt hooking LLE with RLE to lift to surface    Transfers Overall transfer  level: Needs assistance   Transfers: Sit to/from Stand Sit to Stand: Min guard, Min assist           General transfer comment: pt able to rise from bed and from chair x 3 trials with increased time and effort. without UE support at bed min assist, minguard to rise with assist of rail and armrests from chair    Ambulation/Gait Ambulation/Gait assistance: Min guard Gait Distance (Feet): 56 Feet Assistive device: Rolling walker (2 wheels) Gait Pattern/deviations: Step-through pattern, Decreased stride length, Trunk flexed, Decreased dorsiflexion - left   Gait velocity interpretation: <1.8 ft/sec, indicate of risk for recurrent falls   General Gait Details: pt initially dragging left foot and unable to clear. After grossly 15' pt able to utilize bil UE support on RW and clear LLE with stepping. Pt walked 22', 50', 74' with seated rest between trials, heavy reliance on RW and cues for increased LLE dorsiflexion and foot clearance   Stairs             Wheelchair Mobility    Modified Rankin (Stroke Patients Only)       Balance Overall balance assessment: Needs assistance Sitting-balance support: No upper extremity supported, Feet supported Sitting balance-Leahy Scale: Good     Standing balance support: Reliant on assistive device for balance, Bilateral upper extremity supported Standing balance-Leahy Scale: Poor Standing balance comment: heavy reliance on RW                            Cognition Arousal/Alertness: Awake/alert Behavior During Therapy: WFL for tasks assessed/performed Overall Cognitive Status: Within Functional Limits for tasks assessed  Exercises General Exercises - Lower Extremity Long Arc Quad: Left, Seated, AAROM, 15 reps (AAROm for end range to achieve full extension) Hip Flexion/Marching: AAROM, Left, Seated, 15 reps    General Comments        Pertinent Vitals/Pain Pain  Assessment Pain Assessment: No/denies pain    Home Living                          Prior Function            PT Goals (current goals can now be found in the care plan section) Progress towards PT goals: Progressing toward goals    Frequency    Min 3X/week      PT Plan Current plan remains appropriate    Co-evaluation              AM-PAC PT "6 Clicks" Mobility   Outcome Measure  Help needed turning from your back to your side while in a flat bed without using bedrails?: A Little Help needed moving from lying on your back to sitting on the side of a flat bed without using bedrails?: A Little Help needed moving to and from a bed to a chair (including a wheelchair)?: A Little Help needed standing up from a chair using your arms (e.g., wheelchair or bedside chair)?: A Little Help needed to walk in hospital room?: A Little Help needed climbing 3-5 steps with a railing? : A Lot 6 Click Score: 17    End of Session Equipment Utilized During Treatment: Gait belt Activity Tolerance: Patient tolerated treatment well Patient left: in bed;with call bell/phone within reach;with nursing/sitter in room Nurse Communication: Mobility status PT Visit Diagnosis: Other abnormalities of gait and mobility (R26.89);Muscle weakness (generalized) (M62.81)     Time: 7829-5621 PT Time Calculation (min) (ACUTE ONLY): 32 min  Charges:  $Gait Training: 8-22 mins $Therapeutic Exercise: 8-22 mins                     Merryl Hacker, PT Acute Rehabilitation Services Office: [phone]    Aaron Sexton 12/12/2021, 1:45 PM

## 2021-12-12 NOTE — Progress Notes (Addendum)
Occupational Therapy Treatment Patient Details Name: Aaron Sexton MRN: [id number] DOB: 1946-01-22 Today's Date: 12/12/2021   History of present illness 76 y.o. male admitted 12/09/21 for same day L femoral endarterectomy, femoral-popliteal bypass. Post-op course complicated by hypotension. PMH includes PVD, HTN, HLD, cerebrovascular disease, ASCVD, OA.   OT comments  Pt progressing towards established OT goals and agreeable to therapy. Pt with increased edema at LLE and limited ROM; facilitating AAROM exercises at LLE. Providing education on compensatory techniques for LB dressing and pt performing simulated task. Pt performing functional mobility in room with Min A and RW and performing oral care at sink with Min Guard A and seated rest breaks. Continue to recommend dc to home with HHOT and will continue to follow acutely as admitted.   Recommendations for follow up therapy are one component of a multi-disciplinary discharge planning process, led by the attending physician.  Recommendations may be updated based on patient status, additional functional criteria and insurance authorization.    Follow Up Recommendations  Home health OT    Assistance Recommended at Discharge Intermittent Supervision/Assistance  Patient can return home with the following  A little help with walking and/or transfers;A little help with bathing/dressing/bathroom;Assist for transportation   Equipment Recommendations  None recommended by OT    Recommendations for Other Services      Precautions / Restrictions Precautions Precautions: Fall Restrictions Weight Bearing Restrictions: No       Mobility Bed Mobility Overal bed mobility: Needs Assistance Bed Mobility: Supine to Sit     Supine to sit: Mod assist, HOB elevated     General bed mobility comments: ModA for LLE management and trunk elevation, increased time and effort with cues for sequencing    Transfers Overall transfer level: Needs  assistance   Transfers: Sit to/from Stand Sit to Stand: Min assist, From elevated surface     Step pivot transfers: Min assist     General transfer comment: increased time and effort, cues for BUE/BLE placement prior to standing; minA for trunk elevation     Balance Overall balance assessment: Needs assistance Sitting-balance support: No upper extremity supported, Feet supported Sitting balance-Leahy Scale: Good     Standing balance support: Reliant on assistive device for balance Standing balance-Leahy Scale: Poor Standing balance comment: heavy reliance on RW to offload painful LLE                           ADL either performed or assessed with clinical judgement   ADL Overall ADL's : Needs assistance/impaired     Grooming: Oral care;Min guard;Standing Grooming Details (indicate cue type and reason): Min Guard A for safety in standing. Taking seated rest breaks with 3n1             Lower Body Dressing: Minimal assistance;Sit to/from stand Lower Body Dressing Details (indicate cue type and reason): Pt practice donning LB dressing Toilet Transfer: Minimal assistance;Ambulation;Rolling walker (2 wheels);BSC/3in1 Toilet Transfer Details (indicate cue type and reason): Min A for slight power up       Tub/Shower Transfer Details (indicate cue type and reason): Initating education on shower transfer and steppign in backwards with RLE first Functional mobility during ADLs: Minimal assistance;Rolling walker (2 wheels) (assistance for forward progression/step of LLE) General ADL Comments: Providing education on LB dressing techniques. Pt performing mobility to/from sink and completing oral care at sink. Also initiating education on shower trnasfer at home and will need to practice  Extremity/Trunk Assessment Upper Extremity Assessment Upper Extremity Assessment: Overall WFL for tasks assessed   Lower Extremity Assessment Lower Extremity Assessment: Defer to PT  evaluation LLE Deficits / Details: s/p LLE revascularization with significant post-op pain limiting AROM and strength        Vision       Perception Perception Perception: Within Functional Limits   Praxis Praxis Praxis: Intact    Cognition Arousal/Alertness: Awake/alert Behavior During Therapy: WFL for tasks assessed/performed Overall Cognitive Status: Within Functional Limits for tasks assessed                                 General Comments: Motivated and agreeable to therapy        Exercises Exercises: General Lower Extremity General Exercises - Lower Extremity Ankle Circles/Pumps: AROM, Both, 10 reps, Supine Long Arc Quad: AAROM, Left, Seated Hip Flexion/Marching: AAROM, Left, 10 reps, Seated    Shoulder Instructions       General Comments      Pertinent Vitals/ Pain       Pain Assessment Pain Assessment: Faces Faces Pain Scale: Hurts little more Pain Location: LLE Pain Descriptors / Indicators: Grimacing, Guarding, Moaning Pain Intervention(s): Monitored during session, Limited activity within patient's tolerance, Repositioned  Home Living                                          Prior Functioning/Environment              Frequency  Min 2X/week        Progress Toward Goals  OT Goals(current goals can now be found in the care plan section)  Progress towards OT goals: Progressing toward goals  Acute Rehab OT Goals OT Goal Formulation: With patient Time For Goal Achievement: 12/23/21 Potential to Achieve Goals: Good ADL Goals Pt Will Perform Grooming: with modified independence;standing Pt Will Perform Lower Body Dressing: with modified independence;sit to/from stand;with adaptive equipment Pt Will Transfer to Toilet: with modified independence;ambulating;regular height toilet  Plan Discharge plan remains appropriate    Co-evaluation                 AM-PAC OT "6 Clicks" Daily Activity      Outcome Measure   Help from another person eating meals?: None Help from another person taking care of personal grooming?: None Help from another person toileting, which includes using toliet, bedpan, or urinal?: A Little Help from another person bathing (including washing, rinsing, drying)?: A Lot Help from another person to put on and taking off regular upper body clothing?: A Little Help from another person to put on and taking off regular lower body clothing?: A Little 6 Click Score: 19    End of Session Equipment Utilized During Treatment: Rolling walker (2 wheels);Gait belt  OT Visit Diagnosis: Unsteadiness on feet (R26.81);Pain Pain - Right/Left: Left Pain - part of body: Leg   Activity Tolerance Patient tolerated treatment well   Patient Left with call bell/phone within reach;in bed;with bed alarm set   Nurse Communication Mobility status        Time: 2440-1027 OT Time Calculation (min): 25 min  Charges: OT General Charges $OT Visit: 1 Visit OT Treatments $Self Care/Home Management : 23-37 mins  Marnee Sherrard MSOT, OTR/L Acute Rehab Office: [phone]  Theodoro Grist Dierdre Mccalip 12/12/2021, 9:45 AM

## 2021-12-12 NOTE — Progress Notes (Addendum)
  Progress Note    12/12/2021 7:33 AM 3 Days Post-Op  Subjective:  No major complaints. Sitting up in chair eating breakfast   Vitals:   12/12/21 0300 12/12/21 0400  BP: (!) 117/49 (!) 128/50  Pulse: 83 79  Resp: (!) 27 (!) 32  Temp:    SpO2: 95% 94%   Physical Exam: Cardiac:  regular Lungs:  non labored Incisions:  left groin and AK pop incisions are intact and well appearing Extremities:  well perfused and warm. Brisk doppler PT and DP signals Neurologic: alert and oriented  CBC    Component Value Date/Time   WBC 13.1 (H) 12/12/2021 0440   RBC 2.50 (L) 12/12/2021 0440   HGB 7.8 (L) 12/12/2021 0440   HGB 9.4 (L) 10/12/2021 1623   HCT 23.1 (L) 12/12/2021 0440   HCT 26.4 (L) 10/12/2021 1623   PLT 142 (L) 12/12/2021 0440   PLT 327 10/12/2021 1623   MCV 92.4 12/12/2021 0440   MCV 110 (H) 10/12/2021 1623   MCH 31.2 12/12/2021 0440   MCHC 33.8 12/12/2021 0440   RDW 18.3 (H) 12/12/2021 0440   RDW 13.2 10/12/2021 1623   LYMPHSABS 2.5 09/21/2021 1329   MONOABS 1.2 (H) 09/21/2021 1329   EOSABS 0.2 09/21/2021 1329   BASOSABS 0.1 09/21/2021 1329    BMET    Component Value Date/Time   NA 134 (L) 12/12/2021 0440   NA 139 10/12/2021 1623   K 4.0 12/12/2021 0440   CL 106 12/12/2021 0440   CO2 25 12/12/2021 0440   GLUCOSE 100 (H) 12/12/2021 0440   BUN 21 12/12/2021 0440   BUN 17 10/12/2021 1623   CREATININE 1.18 12/12/2021 0440   CREATININE 0.91 09/15/2015 1508   CALCIUM 7.9 (L) 12/12/2021 0440   GFRNONAA >60 12/12/2021 0440   GFRAA >60 02/09/2020 1344    INR    Component Value Date/Time   INR 1.1 12/09/2021 0606     Intake/Output Summary (Last 24 hours) at 12/12/2021 0733 Last data filed at 12/12/2021 0700 Gross per 24 hour  Intake 720 ml  Output 2960 ml  Net -2240 ml     Assessment/Plan:  76 y.o. male is s/p left CFE  fem AK pop bypass with PTA/ Stent CIA/ EIA 3 Days Post-Op   Left leg incisions are c/d/I. Left leg well perfused and warm with brisk  doppler signals Hgb stable 7.8. Still fluid overloaded Otherwise hemodynamically stable Continue Aspirin, statin, Plavix Mobilize as tolerated PT/ OT recommending HH. Orders placed Waiting on bed to transfer to 4E  DVT prophylaxis:  sq heparin   Karoline Caldwell, PA-C [facility] [phone] 12/12/2021 7:33 AM  I have seen and evaluated the patient. I agree with the PA note as documented above.  Postop day 3 status post left common femoral endarterectomy with profundoplasty and bovine patch with retrograde iliac stenting and a left common femoral to above-knee popliteal bypass.  Looks great today.  Hemoglobin stable.  Waiting on transfer to 4 E.  Incisions look good.  Brisk Doppler signals in the left foot.  Aspirin Plavix statin.  Discussed likely discharge tomorrow.  Mobilize with PT OT etc.  Did well with diuresis yesterday.  Marty Heck, MD [facility] [place] Office: [phone]

## 2021-12-12 NOTE — Anesthesia Postprocedure Evaluation (Signed)
Anesthesia Post Note  Patient: Aaron Sexton  Procedure(s) Performed: LEFT COMMON FEMORAL ENDARTERECTOMY WITH 1 CM X 6 CM XENOSURE BOVINE PATCH ANGIOPLASTY (Left: Groin) LEFT FEMORAL- ABOVE KNEE POPLITEAL BYPASS WITH 6 mm x 80 cm PROPATEN GRAFT (Left: Groin) LEFT ILIAC AND LOWER EXTREMITY ANGIOGRAM (Left: Groin) INSERTION OF LEFT COMMON AND EXTERNAL  ILIAC STENTS WITH ANGIOPLASTY (Groin)     Patient location during evaluation: Other Anesthesia Type: General Level of consciousness: awake and alert Pain management: pain level controlled Vital Signs Assessment: post-procedure vital signs reviewed and stable Respiratory status: spontaneous breathing, nonlabored ventilation and respiratory function stable Cardiovascular status: blood pressure returned to baseline and stable Postop Assessment: no apparent nausea or vomiting Anesthetic complications: no   No notable events documented.  Last Vitals:  Vitals:   12/12/21 0700 12/12/21 0747  BP: 139/77   Pulse: 88   Resp: 16   Temp:  36.8 C  SpO2: 95%     Last Pain:  Vitals:   12/12/21 0747  TempSrc: Oral  PainSc:                  [name],W. EDMOND

## 2021-12-13 ENCOUNTER — Telehealth: Payer: Self-pay | Admitting: Vascular Surgery

## 2021-12-13 NOTE — Progress Notes (Signed)
Occupational Therapy Treatment Patient Details Name: Aaron Sexton MRN: [id number] DOB: [birth year] Today's Date: 12/13/2021   History of present illness 76 y.o. male admitted 12/09/21 for same day L femoral endarterectomy, femoral-popliteal bypass. Post-op course complicated by hypotension. PMH includes PVD, HTN, HLD, cerebrovascular disease, ASCVD, OA.   OT comments  Upon arrival, pt sitting in straight back chair, dressing and eager for dc to home. Pt very agreeable to therapy. Reviewing education on LB dressing techniques, shower transfer, and toileting. Pt performing simulated shower transfer with Min Guard A and RW. Pt demonstrating understanding. Continue to recommend dc to home with HHOT. Answered all questions in preparation for dc later today.    Recommendations for follow up therapy are one component of a multi-disciplinary discharge planning process, led by the attending physician.  Recommendations may be updated based on patient status, additional functional criteria and insurance authorization.    Follow Up Recommendations  Home health OT    Assistance Recommended at Discharge Intermittent Supervision/Assistance  Patient can return home with the following  A little help with walking and/or transfers;A little help with bathing/dressing/bathroom;Assist for transportation   Equipment Recommendations  None recommended by OT    Recommendations for Other Services      Precautions / Restrictions Precautions Precautions: Fall Restrictions Weight Bearing Restrictions: No       Mobility Bed Mobility               General bed mobility comments: Sitting in straight back chair upon arrival    Transfers Overall transfer level: Needs assistance Equipment used: Rolling walker (2 wheels) Transfers: Sit to/from Stand Sit to Stand: Min guard           General transfer comment: Min Guard A for safety     Balance Overall balance assessment: Needs  assistance Sitting-balance support: No upper extremity supported, Feet supported Sitting balance-Leahy Scale: Good     Standing balance support: Reliant on assistive device for balance, Bilateral upper extremity supported Standing balance-Leahy Scale: Poor                             ADL either performed or assessed with clinical judgement   ADL Overall ADL's : Needs assistance/impaired                       Lower Body Dressing Details (indicate cue type and reason): Reviewing LB dressing techniques Toilet Transfer: Min guard;Rolling walker (2 wheels) (simulated)       Tub/ Shower Transfer: Min guard;Walk-in shower;Rolling walker (2 wheels);Ambulation Tub/Shower Transfer Details (indicate cue type and reason): Reviewing education from last session. Pt recalling to enter shower backwards, but needing cues for recall of which foot to step over with first. Pt  performing simulated shower transfer demonstrating understanding. Min Guaurd A for safety Functional mobility during ADLs: Min guard;Cueing for sequencing General ADL Comments: Reviewing all education and answering questions in preparation for dc    Extremity/Trunk Assessment Upper Extremity Assessment Upper Extremity Assessment: Overall WFL for tasks assessed   Lower Extremity Assessment Lower Extremity Assessment: Defer to PT evaluation LLE Deficits / Details: s/p LLE revascularization        Vision       Perception     Praxis      Cognition Arousal/Alertness: Awake/alert Behavior During Therapy: WFL for tasks assessed/performed Overall Cognitive Status: Within Functional Limits for tasks assessed  General Comments: Motivated and agreeable to therapy        Exercises      Shoulder Instructions       General Comments Noting decreased edema in LLE compared to yesterday's session    Pertinent Vitals/ Pain       Pain Assessment Pain  Assessment: Faces Faces Pain Scale: Hurts a little bit Pain Location: LLE Pain Descriptors / Indicators: Tightness Pain Intervention(s): Monitored during session, Limited activity within patient's tolerance, Repositioned  Home Living                                          Prior Functioning/Environment              Frequency  Min 2X/week        Progress Toward Goals  OT Goals(current goals can now be found in the care plan section)  Progress towards OT goals: Progressing toward goals  Acute Rehab OT Goals OT Goal Formulation: With patient Time For Goal Achievement: 12/23/21 Potential to Achieve Goals: Good ADL Goals Pt Will Perform Grooming: with modified independence;standing Pt Will Perform Lower Body Dressing: with modified independence;sit to/from stand;with adaptive equipment Pt Will Transfer to Toilet: with modified independence;ambulating;regular height toilet  Plan Discharge plan remains appropriate    Co-evaluation                 AM-PAC OT "6 Clicks" Daily Activity     Outcome Measure   Help from another person eating meals?: None Help from another person taking care of personal grooming?: None Help from another person toileting, which includes using toliet, bedpan, or urinal?: A Little Help from another person bathing (including washing, rinsing, drying)?: A Little Help from another person to put on and taking off regular upper body clothing?: A Little Help from another person to put on and taking off regular lower body clothing?: A Little 6 Click Score: 20    End of Session Equipment Utilized During Treatment: Rolling walker (2 wheels);Gait belt  OT Visit Diagnosis: Unsteadiness on feet (R26.81);Pain Pain - Right/Left: Left Pain - part of body: Leg   Activity Tolerance Patient tolerated treatment well   Patient Left with call bell/phone within reach;in chair   Nurse Communication Mobility status        Time:  1000-1009 OT Time Calculation (min): 9 min  Charges: OT General Charges $OT Visit: 1 Visit OT Treatments $Self Care/Home Management : 8-22 mins  Maurio Baize MSOT, OTR/L Acute Rehab Office: [phone]  Theodoro Grist Johncharles Fusselman 12/13/2021, 11:30 AM

## 2021-12-13 NOTE — Progress Notes (Signed)
Physical Therapy Treatment Patient Details Name: Aaron Sexton MRN: [id number] DOB: November 14, 1945 Today's Date: 12/13/2021   History of Present Illness 76 y.o. male admitted 12/09/21 for same day L femoral endarterectomy, femoral-popliteal bypass. Post-op course complicated by hypotension. PMH includes PVD, HTN, HLD, cerebrovascular disease, ASCVD, OA.    PT Comments    Pt received OOB in BR on arrival and agreeable to session with continued progress towards acute goals. Pt demonstrating ambulation with RW and min guard for safety with increased L foot clearance throughout. Pt requiring x1 standing rest secondary to fatigue. Encouraged pt to continue mobility post d/c and educated pt re; activity recommendations and benefits of continued mobility with pt verbalizing understanding. Pt continues to benefit from skilled PT services to progress toward functional mobility goals.    Recommendations for follow up therapy are one component of a multi-disciplinary discharge planning process, led by the attending physician.  Recommendations may be updated based on patient status, additional functional criteria and insurance authorization.  Follow Up Recommendations  Home health PT     Assistance Recommended at Discharge Frequent or constant Supervision/Assistance  Patient can return home with the following A little help with walking and/or transfers;A little help with bathing/dressing/bathroom;Assistance with cooking/housework;Assist for transportation;Help with stairs or ramp for entrance   Equipment Recommendations  None recommended by PT    Recommendations for Other Services       Precautions / Restrictions Precautions Precautions: Fall Restrictions Weight Bearing Restrictions: No     Mobility  Bed Mobility Overal bed mobility: Needs Assistance             General bed mobility comments: OOB in BR on arrival    Transfers Overall transfer level: Needs assistance Equipment used:  Rolling walker (2 wheels) Transfers: Sit to/from Stand Sit to Stand: Min guard           General transfer comment: min gaurd to rise from [facility], and chair x2    Ambulation/Gait Ambulation/Gait assistance: Min guard Gait Distance (Feet): 125 Feet Assistive device: Rolling walker (2 wheels) Gait Pattern/deviations: Step-through pattern, Decreased stride length, Trunk flexed, Decreased dorsiflexion - left Gait velocity: decr     General Gait Details: pt with good LLE foot clearnace and min gaurd for safety, standing rest x1   Stairs             Wheelchair Mobility    Modified Rankin (Stroke Patients Only)       Balance Overall balance assessment: Needs assistance Sitting-balance support: No upper extremity supported, Feet supported Sitting balance-Leahy Scale: Good     Standing balance support: Reliant on assistive device for balance, Bilateral upper extremity supported Standing balance-Leahy Scale: Poor Standing balance comment: static standing without UE support,                            Cognition Arousal/Alertness: Awake/alert Behavior During Therapy: WFL for tasks assessed/performed Overall Cognitive Status: Within Functional Limits for tasks assessed                                 General Comments: Motivated and agreeable to therapy        Exercises      General Comments        Pertinent Vitals/Pain Pain Assessment Pain Assessment: Faces Faces Pain Scale: Hurts a little bit Pain Location: LLE Pain Descriptors / Indicators: Tightness Pain Intervention(s): Monitored  during session, Limited activity within patient's tolerance    Home Living                          Prior Function            PT Goals (current goals can now be found in the care plan section) Acute Rehab PT Goals Patient Stated Goal: head home PT Goal Formulation: With patient Time For Goal Achievement: 12/24/21    Frequency     Min 3X/week      PT Plan Current plan remains appropriate    Co-evaluation              AM-PAC PT "6 Clicks" Mobility   Outcome Measure  Help needed turning from your back to your side while in a flat bed without using bedrails?: A Little Help needed moving from lying on your back to sitting on the side of a flat bed without using bedrails?: A Little Help needed moving to and from a bed to a chair (including a wheelchair)?: A Little Help needed standing up from a chair using your arms (e.g., wheelchair or bedside chair)?: A Little Help needed to walk in hospital room?: A Little Help needed climbing 3-5 steps with a railing? : A Lot 6 Click Score: 17    End of Session   Activity Tolerance: Patient tolerated treatment well Patient left: with call bell/phone within reach;in chair Nurse Communication: Mobility status PT Visit Diagnosis: Other abnormalities of gait and mobility (R26.89);Muscle weakness (generalized) (M62.81) Pain - Right/Left: Left Pain - part of body: Leg     Time: 1610-9604 PT Time Calculation (min) (ACUTE ONLY): 15 min  Charges:  $Gait Training: 8-22 mins                     Stevens Magwood R. PTA Acute Rehabilitation Services Office: [phone]    Catalina Antigua 12/13/2021, 10:37 AM

## 2021-12-13 NOTE — Telephone Encounter (Signed)
-----   Message from Karoline Caldwell, [place] sent at 12/13/2021  7:23 AM EDT ----- S/p left fem ak pop and PTA/ CIA/EIA stenting by Dr. Carlis Abbott. Needs follow up incision check in 2-3 weeks

## 2021-12-13 NOTE — Discharge Instructions (Signed)
 [facility] [place]  Discharge instructions  Lower Extremity Bypass Surgery  Please refer to the following instruction for your post-procedure care. Your surgeon or physician assistant will discuss any changes with you.  Activity  You are encouraged to walk as much as you can. You can slowly return to normal activities during the month after your surgery. Avoid strenuous activity and heavy lifting until your doctor tells you it's OK. Avoid activities such as vacuuming or swinging a golf club. Do not drive until your doctor give the OK and you are no longer taking prescription pain medications. It is also normal to have difficulty with sleep habits, eating and bowel movement after surgery. These will go away with time.  Bathing/Showering  Shower daily after you go home. Do not soak in a bathtub, hot tub, or swim until the incision heals completely.  Incision Care  Clean your incision with mild soap and water. Shower every day. Pat the area dry with a clean towel. You do not need a bandage unless otherwise instructed. Do not apply any ointments or creams to your incision. If you have open wounds you will be instructed how to care for them or a visiting nurse may be arranged for you. If you have staples or sutures along your incision they will be removed at your post-op appointment. You may have skin glue on your incision. Do not peel it off. It will come off on its own in about one week.  Wash the groin wound with soap and water daily and pat dry. (No tub bath-only shower)  Then put a dry gauze or washcloth in the groin to keep this area dry to help prevent wound infection.  Do this daily and as needed.  Do not use Vaseline or neosporin on your incisions.  Only use soap and water on your incisions and then protect and keep dry.  Diet  Resume your normal diet. There are no special food restrictions following this procedure. A low fat/ low cholesterol diet is  recommended for all patients with vascular disease. In order to heal from your surgery, it is CRITICAL to get adequate nutrition. Your body requires vitamins, minerals, and protein. Vegetables are the best source of vitamins and minerals. Vegetables also provide the perfect balance of protein. Processed food has little nutritional value, so try to avoid this.  Medications  Resume taking all your medications unless your doctor or physician assistant tells you not to. If your incision is causing pain, you may take over-the-counter pain relievers such as acetaminophen (Tylenol). If you were prescribed a stronger pain medication, please aware these medication can cause nausea and constipation. Prevent nausea by taking the medication with a snack or meal. Avoid constipation by drinking plenty of fluids and eating foods with high amount of fiber, such as fruits, vegetables, and grains. Take Colace 100 mg (an over-the-counter stool softener) twice a day as needed for constipation.  Do not take Tylenol if you are taking prescription pain medications.  Follow Up  Our office will schedule a follow up appointment 2-3 weeks following discharge.  Please call us immediately for any of the following conditions  Severe or worsening pain in your legs or feet while at rest or while walking Increase pain, redness, warmth, or drainage (pus) from your incision site(s) Fever of 101 degree or higher The swelling in your leg with the bypass suddenly worsens and becomes more painful than when you were in the hospital If you have   been instructed to feel your graft pulse then you should do so every day. If you can no longer feel this pulse, call the office immediately. Not all patients are given this instruction.  Leg swelling is common after leg bypass surgery.  The swelling should improve over a few months following surgery. To improve the swelling, you may elevate your legs above the level of your heart while you are  sitting or resting. Your surgeon or physician assistant may ask you to apply an ACE wrap or wear compression (TED) stockings to help to reduce swelling.  Reduce your risk of vascular disease  Stop smoking. If you would like help call [facility] at 1-800-QUIT-NOW (1-800-784-8669) or [place] at 336-586-4000.  Manage your cholesterol Maintain a desired weight Control your diabetes weight Control your diabetes Keep your blood pressure down  If you have any questions, please call the office at [phone]  

## 2021-12-13 NOTE — Addendum Note (Signed)
Addendum  created 12/13/21 1254 by Maude Leriche, CRNA   Charge Capture section accepted

## 2021-12-13 NOTE — TOC Transition Note (Signed)
Transition of Care (TOC) - CM/SW Discharge Note [name] RN, BSN Transitions of Care Unit 4E- RN Case Manager See Treatment Team for direct phone #    Patient Details  Name: Aaron Sexton MRN: [id number] Date of Birth: [birth year]  Transition of Care [facility]) CM/SW Contact:  [name], RN Phone Number: 12/13/2021, 10:33 AM   Clinical Narrative:    Pt stable for transition home today, Orders for HHPT/OT placed.  CM spoke with pt at bedside. Pt is agreeable to [facility] services. List provided for [facility] choice Per CMS guidelines from medicare.gov website with star ratings (copy placed in shadow chart). After review of list pt has selected [facility] as first choice.   Address, phone # and PCP all confirmed in epic.  Pt reports he has needed DME at home- including RW, upright walker and cane  Wife and son to provide transport home.   Call made to [facility] for [facility] referral- referral has been accepted.     Final next level of care: [place] Barriers to Discharge: No Barriers Identified   Patient Goals and CMS Choice Patient states their goals for this hospitalization and ongoing recovery are:: return home CMS Medicare.gov Compare Post Acute Care list provided to:: Patient Choice offered to / list presented to : Patient  Discharge Placement               Home w/ [facility]        Discharge Plan and Services   Discharge Planning Services: CM Consult Post Acute Care Choice: Home Health          DME Arranged: N/A DME Agency: NA       HH Arranged: PT, OT [place] Agency: [place] Date [place]: 12/13/21 Time [place]: 1033 Representative spoke with at [place]: [place] Interventions     Readmission Risk Interventions    12/13/2021   10:33 AM  Readmission Risk Prevention Plan  Transportation Screening Complete  PCP or Specialist Appt within 5-7 Days Complete  Home Care Screening  Complete  Medication Review (RN CM) Complete

## 2021-12-13 NOTE — Care Management Important Message (Signed)
Important Message  Patient Details  Name: Aaron Sexton MRN: [id number] Date of Birth: Apr 20, 1946   Medicare Important Message Given:  Yes     Shelda Altes 12/13/2021, 9:57 AM

## 2021-12-13 NOTE — Discharge Summary (Signed)
Bypass Discharge Summary Patient ID: Aaron Sexton [id number] 75 y.o. 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 Allyson Sabal.  He presented to the office for follow-up to discuss surgical intervention and developed a wound on his left foot with a toe pressure of 0 progressing to critical limb ischemia with tissue loss.  He presents today after risks and benefits discussed.  An assistant was needed for exposure and to expedite the case and for the complexity including endarterectomy, patch angioplasty, wire exchange for stenting, and sewing the bypass.    Hospital Course: Mr. [place] was admitted on 12/17/21 and underwent Left leg exploration, washout, myriad more cell placement, VAC dressing for non healing left foot wound. He tolerated the procedure well and was taken to the recovery room in stable condition.   POD#1, left leg remained well perfused and warm. Incisions intact. Prevena with good seal. Left leg ACE wrap continued. Hemoglobin dropped so he was given 1 unit of PRBC. Some leukocytosis continued on Ancef. He was evaluated by PT and Home health PT was recommended.   POD#2, patient continued to do well. Left leg remained well perfused. Incisions well appearing and intact. He remained hemodynamically stable. He will go home with  Incisional VAC x 1 week. Home health PT/ OT order placed. He otherwise remained stable for discharge home. Keflex x 7 days sent to patients pharmacy. He has follow up arranged on 01/03/22 with Dr. Chestine Spore for wound check.  Consults: None  Treatments: surgery: Left leg exploration, washout, myriad more cell placement, VAC dressing; Antibiotics: Cefazolin   Disposition: Discharge disposition: 01-Home or Self Care       - For [facility] Registry use ---  Post-op:  Wound infection: No  Graft infection: No  Transfusion: Yes  If yes, 8 units given. 2 units FFP New Arrhythmia: No Patency judged by: [ ]  Dopper only, [ ]  Palpable graft pulse, [ ]  Palpable distal pulse, [ ]  ABI inc. > 0.15, [ ]  Duplex D/C Ambulatory Status: Ambulatory  Complications: MI: Arly.Keller ] No, [ ]  Troponin only, [ ]  EKG or Clinical CHF: No Resp failure: Arly.Keller ] none, [ ]  Pneumonia, [ ]  Ventilator Chg in renal function: [ X] none, [ ]  Inc. Cr > 0.5, [ ]  Temp. Dialysis, [ ]  Permanent dialysis Stroke: Arly.Keller ] None, [ ]  Minor, [ ]  Major Return to OR: No  Reason for return to OR: [ ]  Bleeding, [ ]  Infection, [ ]  Thrombosis, [ ]  Revision  Discharge medications: Statin use:  Yes ASA use:  Yes Plavix use:  Yes Beta blocker use: Yes Coumadin use: Not indicated    Patient Instructions:  Allergies as of 12/13/2021       Reactions   Cardizem [diltiazem Hcl] Hives, Rash        Medication List     TAKE these medications    acetaminophen 500 MG tablet Commonly known as: TYLENOL Take 500 mg by mouth every  8 (eight) hours as needed for moderate pain. Take with Tramadol   aspirin 81 MG tablet Take 81 mg by mouth daily.   chlorthalidone 25 MG tablet Commonly known as: HYGROTON TAKE 1/2 TABLET(12.5 MG) BY MOUTH DAILY   cimetidine 400 MG tablet Commonly known as: TAGAMET Take 400 mg by mouth 2 (two) times daily as needed (indigestion).   clopidogrel 75 MG tablet Commonly known as: PLAVIX TAKE 1 TABLET(75 MG) BY MOUTH DAILY  WITH BREAKFAST   Deferasirox 180 MG Tabs TAKE 4 TABLETS BY MOUTH DAILY   Fish Oil 1000 MG Caps Take 1,000 mg by mouth daily.   loperamide 2 MG tablet Commonly known as: IMODIUM A-D Take 2 mg by mouth 4 (four) times daily as needed for diarrhea or loose stools.   losartan 25 MG tablet Commonly known as: COZAAR TAKE ONE TABLET BY MOUTH EVERY DAY   metoprolol succinate 100 MG 24 hr tablet Commonly known as: TOPROL-XL TAKE 1 TABLET(100 MG) BY MOUTH DAILY WITH OR IMMEDIATELY FOLLOWING A MEAL   nitroGLYCERIN 0.4 MG SL tablet Commonly known as: NITROSTAT Place 1 tablet (0.4 mg total) under the tongue every 5 (five) minutes as needed for chest pain.   pravastatin 40 MG tablet Commonly known as: PRAVACHOL TAKE 1 TABLET(40 MG) BY MOUTH DAILY   traMADol 50 MG tablet Commonly known as: ULTRAM TAKE 1 TABLET BY MOUTH EVERY 8 HOURS AS NEEDED   Vitamin B-12 500 MCG Subl Place 500 mcg under the tongue daily.   Vitamin D (Ergocalciferol) 1.25 MG (50000 UNIT) Caps capsule Commonly known as: DRISDOL TAKE 1 CAPSULE BY MOUTH 1 TIME A WEEK               Discharge Care Instructions  (From admission, onward)           Start     Ordered   12/13/21 0000  Discharge wound care:       Comments: Wash incisions with mild soap and water, pat dry. Do not soak in bathtub   12/13/21 0736           Activity: activity as tolerated, no driving while on analgesics, and no heavy lifting for 6 weeks Diet: low fat, low cholesterol diet Wound Care: keep wound clean and dry  Follow-up with Dr. Chestine Spore in  2-3  weeks.  Signed: Graceann Congress 12/16/2021 2:09 PM

## 2021-12-13 NOTE — Progress Notes (Addendum)
  Progress Note    12/13/2021 7:15 AM 4 Days Post-Op  Subjective:  no complaints   Vitals:   12/13/21 0009 12/13/21 0420  BP: (!) 134/59 (!) 107/54  Pulse: 72 80  Resp: 20 12  Temp: 97.9 F (36.6 C) 98 F (36.7 C)  SpO2: 95% 94%   Physical Exam: Cardiac:  regular Lungs:  non labored Incisions:  left groin, left AK pop incision is intact and well appearing. Ecchymosis is present otherwise soft without hematoma Extremities:  well perfused and warm. Doppler Dp and PT signals in left foot Abdomen:  soft, non distended Neurologic: alert and oriented  CBC    Component Value Date/Time   WBC 13.1 (H) 12/12/2021 0440   RBC 2.50 (L) 12/12/2021 0440   HGB 7.8 (L) 12/12/2021 0440   HGB 9.4 (L) 10/12/2021 1623   HCT 23.1 (L) 12/12/2021 0440   HCT 26.4 (L) 10/12/2021 1623   PLT 142 (L) 12/12/2021 0440   PLT 327 10/12/2021 1623   MCV 92.4 12/12/2021 0440   MCV 110 (H) 10/12/2021 1623   MCH 31.2 12/12/2021 0440   MCHC 33.8 12/12/2021 0440   RDW 18.3 (H) 12/12/2021 0440   RDW 13.2 10/12/2021 1623   LYMPHSABS 2.5 09/21/2021 1329   MONOABS 1.2 (H) 09/21/2021 1329   EOSABS 0.2 09/21/2021 1329   BASOSABS 0.1 09/21/2021 1329    BMET    Component Value Date/Time   NA 134 (L) 12/12/2021 0440   NA 139 10/12/2021 1623   K 4.0 12/12/2021 0440   CL 106 12/12/2021 0440   CO2 25 12/12/2021 0440   GLUCOSE 100 (H) 12/12/2021 0440   BUN 21 12/12/2021 0440   BUN 17 10/12/2021 1623   CREATININE 1.18 12/12/2021 0440   CREATININE 0.91 09/15/2015 1508   CALCIUM 7.9 (L) 12/12/2021 0440   GFRNONAA >60 12/12/2021 0440   GFRAA >60 02/09/2020 1344    INR    Component Value Date/Time   INR 1.1 12/09/2021 0606     Intake/Output Summary (Last 24 hours) at 12/13/2021 0715 Last data filed at 12/13/2021 0423 Gross per 24 hour  Intake 720 ml  Output 1975 ml  Net -1255 ml     Assessment/Plan:  76 y.o. male is s/p left CFE  fem AK pop bypass with PTA/ Stent CIA/ EIA  4 Days Post-Op    Left leg incisions are c/d/I. Left leg well perfused and warm with brisk doppler signals Hemodynamically stable Mobilize as tolerated HH PT/ OT orders placed Continue Aspirin, statin, Plavix Discharge home later today He will have follow up arranged in 2-3 weeks for incision check   DVT prophylaxis:  sq heparin   Karoline Caldwell, PA-C [facility] [phone] 12/13/2021 7:15 AM  I have seen and evaluated the patient. I agree with the PA note as documented above.  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  Incisions look great.  Brisk Doppler signals in the left foot.  Discussed plan for discharge today on aspirin statin Plavix.  Home health PT.  We will see him in 2 to 3 weeks for incision checks.  Patient is chronically anemic and has responded to transfusion over the weekend for acute blood loss anemia.  Hgb stable past couple days.    Marty Heck, MD [facility] [place] Office: [phone]

## 2021-12-14 ENCOUNTER — Encounter: Payer: Medicare PPO | Admitting: Internal Medicine

## 2021-12-14 ENCOUNTER — Other Ambulatory Visit: Payer: Self-pay | Admitting: *Deleted

## 2021-12-14 ENCOUNTER — Telehealth: Payer: Self-pay

## 2021-12-14 NOTE — Telephone Encounter (Signed)
Pt's wife, Anne Ng, called stating that the pt was just d/c/'d from the hospital and he was bleeding from his penis.  Reviewed pt's chart, returned call for clarification, two identifiers used. She stated that the pt had urinated in the bathroom and there was blood in the toilet and dripping from his penis. Pt denies having any issues with urination or any pain. Pt did have a foley catheter that was removed before d/c and he had urinated without bleeding until this occurrence. Instructed pt to monitor and if he had any issues with urination or started having pain, he should report back to the ED. He has been more active today and he was reassured that it was likely some irritation from the catheter. Pt will monitor and call back if needed. Confirmed understanding.

## 2021-12-14 NOTE — Patient Outreach (Signed)
  Care Coordination [facility] Note Transition Care Management Follow-up Telephone Call Date of discharge and from where: 12/13/21 [facility] How have you been since you were released from the hospital? Patient states  Any questions or concerns? No  Items Reviewed: Did the pt receive and understand the discharge instructions provided? Yes  Medications obtained and verified? No  Other? No  Any new allergies since your discharge? No  Dietary orders reviewed? Yes Do you have support at home? No   Home Care and Equipment/Supplies: Were home health services ordered? yes If so, what is the name of the agency? [facility]  Has the agency set up a time to come to the patient's home? No, pt has their contact number Were any new equipment or medical supplies ordered?  No What is the name of the medical supply agency? N/A Were you able to get the supplies/equipment? no Do you have any questions related to the use of the equipment or supplies? No  Functional Questionnaire: (I = Independent and D = Dependent) ADLs: I  Bathing/Dressing- D  Meal Prep- D  Eating- I  Maintaining continence- I  Transferring/Ambulation- I  Managing Meds- I  Follow up appointments reviewed:  PCP Hospital f/u appt confirmed? No   Specialist Hospital f/u appt confirmed? Yes  Scheduled to see Vascular surgery on 01/03/22 @ 1015. Are transportation arrangements needed? No  If their condition worsens, is the pt aware to call PCP or go to the Emergency Dept.? Yes Was the patient provided with contact information for the PCP's office or ED? Yes Was to pt encouraged to call back with questions or concerns? Yes  SDOH assessments and interventions completed:   Yes  Care Coordination Interventions Activated:  No   Care Coordination Interventions:   N/A     Encounter Outcome:  Pt. Visit Completed    Emelia Loron RN, BSN [place] [phone] [email or web address]

## 2021-12-16 ENCOUNTER — Telehealth: Payer: Self-pay | Admitting: *Deleted

## 2021-12-16 ENCOUNTER — Inpatient Hospital Stay (HOSPITAL_COMMUNITY)
Admission: EM | Admit: 2021-12-16 | Discharge: 2021-12-19 | DRG: 908 | Disposition: A | Discharge status: Home Health | Payer: Medicare PPO | Attending: Vascular Surgery | Admitting: Vascular Surgery

## 2021-12-16 DIAGNOSIS — I2510 Atherosclerotic heart disease of native coronary artery without angina pectoris: Secondary | ICD-10-CM | POA: Diagnosis present

## 2021-12-16 DIAGNOSIS — Z8249 Family history of ischemic heart disease and other diseases of the circulatory system: Secondary | ICD-10-CM | POA: Diagnosis not present

## 2021-12-16 DIAGNOSIS — I97638 Postprocedural hematoma of a circulatory system organ or structure following other circulatory system procedure: Secondary | ICD-10-CM | POA: Diagnosis not present

## 2021-12-16 DIAGNOSIS — D62 Acute posthemorrhagic anemia: Secondary | ICD-10-CM | POA: Diagnosis not present

## 2021-12-16 DIAGNOSIS — I10 Essential (primary) hypertension: Secondary | ICD-10-CM | POA: Diagnosis present

## 2021-12-16 DIAGNOSIS — E785 Hyperlipidemia, unspecified: Secondary | ICD-10-CM | POA: Diagnosis present

## 2021-12-16 DIAGNOSIS — K219 Gastro-esophageal reflux disease without esophagitis: Secondary | ICD-10-CM | POA: Diagnosis present

## 2021-12-16 DIAGNOSIS — E876 Hypokalemia: Secondary | ICD-10-CM | POA: Diagnosis present

## 2021-12-16 DIAGNOSIS — S7012XA Contusion of left thigh, initial encounter: Principal | ICD-10-CM

## 2021-12-16 DIAGNOSIS — I97618 Postprocedural hemorrhage and hematoma of a circulatory system organ or structure following other circulatory system procedure: Secondary | ICD-10-CM | POA: Diagnosis not present

## 2021-12-16 DIAGNOSIS — M25452 Effusion, left hip: Secondary | ICD-10-CM | POA: Diagnosis not present

## 2021-12-16 DIAGNOSIS — Z7982 Long term (current) use of aspirin: Secondary | ICD-10-CM | POA: Diagnosis not present

## 2021-12-16 DIAGNOSIS — R58 Hemorrhage, not elsewhere classified: Secondary | ICD-10-CM | POA: Diagnosis not present

## 2021-12-16 DIAGNOSIS — Z87891 Personal history of nicotine dependence: Secondary | ICD-10-CM

## 2021-12-16 DIAGNOSIS — M1990 Unspecified osteoarthritis, unspecified site: Secondary | ICD-10-CM | POA: Diagnosis not present

## 2021-12-16 DIAGNOSIS — S81802A Unspecified open wound, left lower leg, initial encounter: Secondary | ICD-10-CM | POA: Diagnosis present

## 2021-12-16 DIAGNOSIS — Z79899 Other long term (current) drug therapy: Secondary | ICD-10-CM

## 2021-12-16 DIAGNOSIS — L7632 Postprocedural hematoma of skin and subcutaneous tissue following other procedure: Secondary | ICD-10-CM | POA: Diagnosis present

## 2021-12-16 DIAGNOSIS — Z888 Allergy status to other drugs, medicaments and biological substances status: Secondary | ICD-10-CM | POA: Diagnosis not present

## 2021-12-16 DIAGNOSIS — Y832 Surgical operation with anastomosis, bypass or graft as the cause of abnormal reaction of the patient, or of later complication, without mention of misadventure at the time of the procedure: Secondary | ICD-10-CM | POA: Diagnosis present

## 2021-12-16 DIAGNOSIS — S8992XA Unspecified injury of left lower leg, initial encounter: Secondary | ICD-10-CM | POA: Diagnosis not present

## 2021-12-16 DIAGNOSIS — I739 Peripheral vascular disease, unspecified: Secondary | ICD-10-CM | POA: Diagnosis present

## 2021-12-16 DIAGNOSIS — D649 Anemia, unspecified: Secondary | ICD-10-CM | POA: Diagnosis not present

## 2021-12-16 DIAGNOSIS — Z7902 Long term (current) use of antithrombotics/antiplatelets: Secondary | ICD-10-CM | POA: Diagnosis not present

## 2021-12-16 DIAGNOSIS — I743 Embolism and thrombosis of arteries of the lower extremities: Secondary | ICD-10-CM | POA: Diagnosis not present

## 2021-12-16 DIAGNOSIS — I70202 Unspecified atherosclerosis of native arteries of extremities, left leg: Secondary | ICD-10-CM | POA: Diagnosis present

## 2021-12-16 DIAGNOSIS — Z8679 Personal history of other diseases of the circulatory system: Secondary | ICD-10-CM

## 2021-12-16 NOTE — Telephone Encounter (Signed)
Patient's wife called and states Physical therapist was working with patient yesterday and his incision opened up a small area and was bleeding she applied pressure and got it stopped but he reopened it today she has gotten it to stop for now but is asking what to do if it happens over the week end. Advised her to apply pressure to the area if she can't get it to stop to call EMS. She verbalized understanding.

## 2021-12-16 NOTE — ED Triage Notes (Signed)
Procedure on left leg last week- stent in leg. Bleeding on and off past several days. Bleeding appears worse today- 'gushing.' Bleeding controlled by [facility] with abdominal pad and ace wrap.   156/75 76HR 97%RA

## 2021-12-17 ENCOUNTER — Emergency Department (EMERGENCY_DEPARTMENT_HOSPITAL): Payer: Medicare PPO | Admitting: Certified Registered"

## 2021-12-17 ENCOUNTER — Encounter (HOSPITAL_COMMUNITY)
Admission: EM | Disposition: A | Discharge status: Home Health | Payer: Self-pay | Source: Home / Self Care | Attending: Vascular Surgery

## 2021-12-17 ENCOUNTER — Emergency Department (HOSPITAL_COMMUNITY): Payer: Medicare PPO | Admitting: Certified Registered"

## 2021-12-17 ENCOUNTER — Emergency Department (HOSPITAL_COMMUNITY): Payer: Medicare PPO

## 2021-12-17 DIAGNOSIS — Y832 Surgical operation with anastomosis, bypass or graft as the cause of abnormal reaction of the patient, or of later complication, without mention of misadventure at the time of the procedure: Secondary | ICD-10-CM | POA: Diagnosis present

## 2021-12-17 DIAGNOSIS — Z7982 Long term (current) use of aspirin: Secondary | ICD-10-CM | POA: Diagnosis not present

## 2021-12-17 DIAGNOSIS — S81802A Unspecified open wound, left lower leg, initial encounter: Secondary | ICD-10-CM | POA: Diagnosis present

## 2021-12-17 DIAGNOSIS — Z87891 Personal history of nicotine dependence: Secondary | ICD-10-CM

## 2021-12-17 DIAGNOSIS — I97618 Postprocedural hemorrhage and hematoma of a circulatory system organ or structure following other circulatory system procedure: Secondary | ICD-10-CM | POA: Diagnosis not present

## 2021-12-17 DIAGNOSIS — I70202 Unspecified atherosclerosis of native arteries of extremities, left leg: Secondary | ICD-10-CM | POA: Diagnosis present

## 2021-12-17 DIAGNOSIS — Z7902 Long term (current) use of antithrombotics/antiplatelets: Secondary | ICD-10-CM | POA: Diagnosis not present

## 2021-12-17 DIAGNOSIS — M1990 Unspecified osteoarthritis, unspecified site: Secondary | ICD-10-CM

## 2021-12-17 DIAGNOSIS — D649 Anemia, unspecified: Secondary | ICD-10-CM | POA: Diagnosis not present

## 2021-12-17 DIAGNOSIS — I97638 Postprocedural hematoma of a circulatory system organ or structure following other circulatory system procedure: Secondary | ICD-10-CM | POA: Diagnosis not present

## 2021-12-17 DIAGNOSIS — I2510 Atherosclerotic heart disease of native coronary artery without angina pectoris: Secondary | ICD-10-CM | POA: Diagnosis present

## 2021-12-17 DIAGNOSIS — K219 Gastro-esophageal reflux disease without esophagitis: Secondary | ICD-10-CM | POA: Diagnosis present

## 2021-12-17 DIAGNOSIS — I10 Essential (primary) hypertension: Secondary | ICD-10-CM | POA: Diagnosis present

## 2021-12-17 DIAGNOSIS — E876 Hypokalemia: Secondary | ICD-10-CM | POA: Diagnosis present

## 2021-12-17 DIAGNOSIS — L7632 Postprocedural hematoma of skin and subcutaneous tissue following other procedure: Secondary | ICD-10-CM | POA: Diagnosis present

## 2021-12-17 DIAGNOSIS — Z79899 Other long term (current) drug therapy: Secondary | ICD-10-CM | POA: Diagnosis not present

## 2021-12-17 DIAGNOSIS — Z888 Allergy status to other drugs, medicaments and biological substances status: Secondary | ICD-10-CM | POA: Diagnosis not present

## 2021-12-17 DIAGNOSIS — E785 Hyperlipidemia, unspecified: Secondary | ICD-10-CM | POA: Diagnosis present

## 2021-12-17 DIAGNOSIS — Z8249 Family history of ischemic heart disease and other diseases of the circulatory system: Secondary | ICD-10-CM | POA: Diagnosis not present

## 2021-12-17 DIAGNOSIS — D62 Acute posthemorrhagic anemia: Secondary | ICD-10-CM | POA: Diagnosis not present

## 2021-12-17 HISTORY — PX: APPLICATION OF WOUND VAC: SHX5189

## 2021-12-17 HISTORY — PX: WOUND EXPLORATION: SHX6188

## 2021-12-17 LAB — CBC WITH DIFFERENTIAL/PLATELET
Abs Immature Granulocytes: 0.22 10*3/uL — ABNORMAL HIGH (ref 0.00–0.07)
Basophils Absolute: 0.1 10*3/uL (ref 0.0–0.1)
Basophils Relative: 1 %
Eosinophils Absolute: 0.4 10*3/uL (ref 0.0–0.5)
Eosinophils Relative: 3 %
HCT: 26.3 % — ABNORMAL LOW (ref 39.0–52.0)
Hemoglobin: 8.8 g/dL — ABNORMAL LOW (ref 13.0–17.0)
Immature Granulocytes: 2 %
Lymphocytes Relative: 16 %
Lymphs Abs: 1.9 10*3/uL (ref 0.7–4.0)
MCH: 32.2 pg (ref 26.0–34.0)
MCHC: 33.5 g/dL (ref 30.0–36.0)
MCV: 96.3 fL (ref 80.0–100.0)
Monocytes Absolute: 1.1 10*3/uL — ABNORMAL HIGH (ref 0.1–1.0)
Monocytes Relative: 10 %
Neutro Abs: 7.9 10*3/uL — ABNORMAL HIGH (ref 1.7–7.7)
Neutrophils Relative %: 68 %
Platelets: 416 10*3/uL — ABNORMAL HIGH (ref 150–400)
RBC: 2.73 MIL/uL — ABNORMAL LOW (ref 4.22–5.81)
RDW: 18.9 % — ABNORMAL HIGH (ref 11.5–15.5)
WBC: 11.5 10*3/uL — ABNORMAL HIGH (ref 4.0–10.5)
nRBC: 1.3 % — ABNORMAL HIGH (ref 0.0–0.2)

## 2021-12-17 LAB — BASIC METABOLIC PANEL
Anion gap: 9 (ref 5–15)
BUN: 26 mg/dL — ABNORMAL HIGH (ref 8–23)
CO2: 24 mmol/L (ref 22–32)
Calcium: 8.4 mg/dL — ABNORMAL LOW (ref 8.9–10.3)
Chloride: 101 mmol/L (ref 98–111)
Creatinine, Ser: 1.22 mg/dL (ref 0.61–1.24)
GFR, Estimated: >60 mL/min (ref >60)
Glucose, Bld: 108 mg/dL — ABNORMAL HIGH (ref 70–99)
Potassium: 3.4 mmol/L — ABNORMAL LOW (ref 3.5–5.1)
Sodium: 134 mmol/L — ABNORMAL LOW (ref 135–145)

## 2021-12-17 LAB — GLUCOSE, CAPILLARY: Glucose-Capillary: 124 mg/dL — ABNORMAL HIGH (ref 70–99)

## 2021-12-17 LAB — HEMOGLOBIN AND HEMATOCRIT, BLOOD
HCT: 23.7 % — ABNORMAL LOW (ref 39.0–52.0)
Hemoglobin: 7.9 g/dL — ABNORMAL LOW (ref 13.0–17.0)

## 2021-12-17 LAB — PREPARE RBC (CROSSMATCH)

## 2021-12-17 SURGERY — WOUND EXPLORATION
Anesthesia: General | Site: Groin | Laterality: Left

## 2021-12-17 MED ORDER — PHENYLEPHRINE HCL-NACL 20-0.9 MG/250ML-% IV SOLN
INTRAVENOUS | Status: DC | PRN
  Administered 2021-12-17: 40 ug/min via INTRAVENOUS

## 2021-12-17 MED ORDER — SUGAMMADEX SODIUM 200 MG/2ML IV SOLN
INTRAVENOUS | Status: DC | PRN
  Administered 2021-12-17: 200 mg via INTRAVENOUS

## 2021-12-17 MED ORDER — CEFAZOLIN SODIUM-DEXTROSE 2-3 GM-%(50ML) IV SOLR
INTRAVENOUS | Status: DC | PRN
  Administered 2021-12-17: 2 g via INTRAVENOUS

## 2021-12-17 MED ORDER — ROCURONIUM BROMIDE 10 MG/ML (PF) SYRINGE
PREFILLED_SYRINGE | INTRAVENOUS | Status: AC
  Filled 2021-12-17: qty 10

## 2021-12-17 MED ORDER — FENTANYL CITRATE (PF) 250 MCG/5ML IJ SOLN
INTRAMUSCULAR | Status: DC | PRN
  Administered 2021-12-17: 50 ug via INTRAVENOUS
  Administered 2021-12-17: 25 ug via INTRAVENOUS
  Administered 2021-12-17: 50 ug via INTRAVENOUS
  Administered 2021-12-17: 25 ug via INTRAVENOUS
  Administered 2021-12-17: 50 ug via INTRAVENOUS
  Administered 2021-12-17 (×2): 25 ug via INTRAVENOUS

## 2021-12-17 MED ORDER — VITAMIN B-12 1000 MCG PO TABS
500.0000 ug | ORAL_TABLET | Freq: Every day | ORAL | Status: DC
  Administered 2021-12-18 – 2021-12-19 (×2): 500 ug via ORAL
  Filled 2021-12-17: qty 5
  Filled 2021-12-17 (×2): qty 1

## 2021-12-17 MED ORDER — CLOPIDOGREL BISULFATE 75 MG PO TABS
75.0000 mg | ORAL_TABLET | Freq: Every day | ORAL | Status: DC
  Administered 2021-12-17 – 2021-12-19 (×3): 75 mg via ORAL
  Filled 2021-12-17 (×3): qty 1

## 2021-12-17 MED ORDER — DOCUSATE SODIUM 100 MG PO CAPS
100.0000 mg | ORAL_CAPSULE | Freq: Every day | ORAL | Status: DC
  Administered 2021-12-18 – 2021-12-19 (×2): 100 mg via ORAL
  Filled 2021-12-17 (×3): qty 1

## 2021-12-17 MED ORDER — POTASSIUM CHLORIDE CRYS ER 20 MEQ PO TBCR
40.0000 meq | EXTENDED_RELEASE_TABLET | Freq: Once | ORAL | Status: AC
  Administered 2021-12-17: 40 meq via ORAL
  Filled 2021-12-17: qty 2

## 2021-12-17 MED ORDER — BISACODYL 10 MG RE SUPP: 10.0000 mg | Freq: Every day | RECTAL | Status: DC | PRN

## 2021-12-17 MED ORDER — HYDRALAZINE HCL 20 MG/ML IJ SOLN: 5.0000 mg | INTRAMUSCULAR | Status: DC | PRN

## 2021-12-17 MED ORDER — ONDANSETRON HCL 4 MG/2ML IJ SOLN: 4.0000 mg | Freq: Four times a day (QID) | INTRAMUSCULAR | Status: DC | PRN

## 2021-12-17 MED ORDER — ALUM & MAG HYDROXIDE-SIMETH 200-200-20 MG/5ML PO SUSP: 15.0000 mL | ORAL | Status: DC | PRN

## 2021-12-17 MED ORDER — MORPHINE SULFATE (PF) 2 MG/ML IV SOLN
2.0000 mg | INTRAVENOUS | Status: DC | PRN
  Administered 2021-12-17 (×3): 2 mg via INTRAVENOUS
  Filled 2021-12-17 (×3): qty 1

## 2021-12-17 MED ORDER — LOPERAMIDE HCL 2 MG PO CAPS
2.0000 mg | ORAL_CAPSULE | Freq: Four times a day (QID) | ORAL | Status: DC | PRN
Start: 2021-12-17 — End: 2021-12-19

## 2021-12-17 MED ORDER — NITROGLYCERIN 0.4 MG SL SUBL: 0.4000 mg | SUBLINGUAL_TABLET | SUBLINGUAL | Status: DC | PRN

## 2021-12-17 MED ORDER — HEPARIN SODIUM (PORCINE) 5000 UNIT/ML IJ SOLN
5000.0000 [IU] | Freq: Three times a day (TID) | INTRAMUSCULAR | Status: DC
  Administered 2021-12-18 – 2021-12-19 (×4): 5000 [IU] via SUBCUTANEOUS
  Filled 2021-12-17 (×4): qty 1

## 2021-12-17 MED ORDER — FENTANYL CITRATE (PF) 250 MCG/5ML IJ SOLN
INTRAMUSCULAR | Status: AC
  Filled 2021-12-17: qty 5

## 2021-12-17 MED ORDER — HEPARIN 6000 UNIT IRRIGATION SOLUTION
Status: AC
  Filled 2021-12-17: qty 500

## 2021-12-17 MED ORDER — SUCCINYLCHOLINE CHLORIDE 200 MG/10ML IV SOSY
PREFILLED_SYRINGE | INTRAVENOUS | Status: DC | PRN
  Administered 2021-12-17: 100 mg via INTRAVENOUS

## 2021-12-17 MED ORDER — ONDANSETRON HCL 4 MG/2ML IJ SOLN: 4.0000 mg | Freq: Once | INTRAMUSCULAR | Status: DC | PRN

## 2021-12-17 MED ORDER — DEXAMETHASONE SODIUM PHOSPHATE 10 MG/ML IJ SOLN
INTRAMUSCULAR | Status: DC | PRN
  Administered 2021-12-17: 4 mg via INTRAVENOUS

## 2021-12-17 MED ORDER — ACETAMINOPHEN 10 MG/ML IV SOLN
INTRAVENOUS | Status: AC
  Filled 2021-12-17: qty 100

## 2021-12-17 MED ORDER — PANTOPRAZOLE SODIUM 40 MG PO TBEC
40.0000 mg | DELAYED_RELEASE_TABLET | Freq: Every day | ORAL | Status: DC
  Administered 2021-12-17 – 2021-12-19 (×3): 40 mg via ORAL
  Filled 2021-12-17 (×3): qty 1

## 2021-12-17 MED ORDER — PRAVASTATIN SODIUM 40 MG PO TABS
40.0000 mg | ORAL_TABLET | Freq: Every day | ORAL | Status: DC
  Administered 2021-12-17 – 2021-12-18 (×2): 40 mg via ORAL
  Filled 2021-12-17 (×2): qty 1

## 2021-12-17 MED ORDER — DEXAMETHASONE SODIUM PHOSPHATE 10 MG/ML IJ SOLN
INTRAMUSCULAR | Status: AC
  Filled 2021-12-17: qty 1

## 2021-12-17 MED ORDER — PROPOFOL 10 MG/ML IV BOLUS
INTRAVENOUS | Status: DC | PRN
  Administered 2021-12-17: 90 mg via INTRAVENOUS

## 2021-12-17 MED ORDER — POTASSIUM CHLORIDE CRYS ER 20 MEQ PO TBCR: 20.0000 meq | EXTENDED_RELEASE_TABLET | Freq: Every day | ORAL | Status: DC | PRN

## 2021-12-17 MED ORDER — METOPROLOL SUCCINATE ER 100 MG PO TB24
100.0000 mg | ORAL_TABLET | Freq: Every day | ORAL | Status: DC
  Administered 2021-12-17 – 2021-12-19 (×3): 100 mg via ORAL
  Filled 2021-12-17 (×3): qty 1

## 2021-12-17 MED ORDER — GUAIFENESIN-DM 100-10 MG/5ML PO SYRP: 15.0000 mL | ORAL_SOLUTION | ORAL | Status: DC | PRN

## 2021-12-17 MED ORDER — ACETAMINOPHEN 10 MG/ML IV SOLN
INTRAVENOUS | Status: DC | PRN
  Administered 2021-12-17: 1000 mg via INTRAVENOUS

## 2021-12-17 MED ORDER — LIDOCAINE 2% (20 MG/ML) 5 ML SYRINGE
INTRAMUSCULAR | Status: DC | PRN
  Administered 2021-12-17: 80 mg via INTRAVENOUS

## 2021-12-17 MED ORDER — SODIUM CHLORIDE 0.9 % IV SOLN: 500.0000 mL | Freq: Once | INTRAVENOUS | Status: DC | PRN

## 2021-12-17 MED ORDER — TRAMADOL HCL 50 MG PO TABS: 50.0000 mg | ORAL_TABLET | Freq: Three times a day (TID) | ORAL | Status: DC | PRN

## 2021-12-17 MED ORDER — ACETAMINOPHEN 650 MG RE SUPP: 325.0000 mg | RECTAL | Status: DC | PRN

## 2021-12-17 MED ORDER — SODIUM CHLORIDE 0.9 % IV SOLN: INTRAVENOUS | Status: DC | PRN

## 2021-12-17 MED ORDER — PROPOFOL 10 MG/ML IV BOLUS
INTRAVENOUS | Status: AC
  Filled 2021-12-17: qty 20

## 2021-12-17 MED ORDER — MAGNESIUM SULFATE 2 GM/50ML IV SOLN: 2.0000 g | Freq: Every day | INTRAVENOUS | Status: DC | PRN

## 2021-12-17 MED ORDER — CHLORTHALIDONE 25 MG PO TABS
12.5000 mg | ORAL_TABLET | Freq: Every day | ORAL | Status: DC
  Administered 2021-12-17 – 2021-12-19 (×3): 12.5 mg via ORAL
  Filled 2021-12-17 (×3): qty 0.5

## 2021-12-17 MED ORDER — POLYETHYLENE GLYCOL 3350 17 G PO PACK: 17.0000 g | PACK | Freq: Every day | ORAL | Status: DC | PRN

## 2021-12-17 MED ORDER — LOSARTAN POTASSIUM 25 MG PO TABS
25.0000 mg | ORAL_TABLET | Freq: Every day | ORAL | Status: DC
  Administered 2021-12-17 – 2021-12-19 (×3): 25 mg via ORAL
  Filled 2021-12-17 (×3): qty 1

## 2021-12-17 MED ORDER — ONDANSETRON HCL 4 MG/2ML IJ SOLN
INTRAMUSCULAR | Status: AC
  Filled 2021-12-17: qty 2

## 2021-12-17 MED ORDER — ROCURONIUM BROMIDE 10 MG/ML (PF) SYRINGE
PREFILLED_SYRINGE | INTRAVENOUS | Status: DC | PRN
  Administered 2021-12-17: 50 mg via INTRAVENOUS

## 2021-12-17 MED ORDER — SODIUM CHLORIDE 0.9 % IV SOLN: INTRAVENOUS | Status: DC

## 2021-12-17 MED ORDER — LACTATED RINGERS IV SOLN: INTRAVENOUS | Status: DC | PRN

## 2021-12-17 MED ORDER — 0.9 % SODIUM CHLORIDE (POUR BTL) OPTIME
TOPICAL | Status: DC | PRN
  Administered 2021-12-17: 3000 mL

## 2021-12-17 MED ORDER — ASPIRIN 81 MG PO CHEW
81.0000 mg | CHEWABLE_TABLET | Freq: Every day | ORAL | Status: DC
  Administered 2021-12-17 – 2021-12-19 (×3): 81 mg via ORAL
  Filled 2021-12-17 (×3): qty 1

## 2021-12-17 MED ORDER — IOHEXOL 350 MG/ML SOLN
100.0000 mL | Freq: Once | INTRAVENOUS | Status: AC | PRN
  Administered 2021-12-17: 100 mL via INTRAVENOUS

## 2021-12-17 MED ORDER — FENTANYL CITRATE (PF) 100 MCG/2ML IJ SOLN
25.0000 ug | INTRAMUSCULAR | Status: DC | PRN
  Administered 2021-12-17: 25 ug via INTRAVENOUS

## 2021-12-17 MED ORDER — PROMETHAZINE HCL 25 MG/ML IJ SOLN
6.2500 mg | Freq: Once | INTRAMUSCULAR | Status: AC
  Administered 2021-12-17: 6.25 mg via INTRAVENOUS

## 2021-12-17 MED ORDER — CEFAZOLIN SODIUM-DEXTROSE 2-4 GM/100ML-% IV SOLN
2.0000 g | Freq: Three times a day (TID) | INTRAVENOUS | Status: AC
  Administered 2021-12-17 (×2): 2 g via INTRAVENOUS
  Filled 2021-12-17 (×2): qty 100

## 2021-12-17 MED ORDER — PHENOL 1.4 % MT LIQD: 1.0000 | OROMUCOSAL | Status: DC | PRN

## 2021-12-17 MED ORDER — LABETALOL HCL 5 MG/ML IV SOLN: 10.0000 mg | INTRAVENOUS | Status: DC | PRN

## 2021-12-17 MED ORDER — PROMETHAZINE HCL 25 MG/ML IJ SOLN
INTRAMUSCULAR | Status: AC
  Filled 2021-12-17: qty 1

## 2021-12-17 MED ORDER — SUCCINYLCHOLINE CHLORIDE 200 MG/10ML IV SOSY
PREFILLED_SYRINGE | INTRAVENOUS | Status: AC
  Filled 2021-12-17: qty 10

## 2021-12-17 MED ORDER — METOPROLOL TARTRATE 5 MG/5ML IV SOLN: 2.0000 mg | INTRAVENOUS | Status: DC | PRN

## 2021-12-17 MED ORDER — ONDANSETRON HCL 4 MG/2ML IJ SOLN
INTRAMUSCULAR | Status: DC | PRN
  Administered 2021-12-17: 4 mg via INTRAVENOUS

## 2021-12-17 MED ORDER — PHENYLEPHRINE 80 MCG/ML (10ML) SYRINGE FOR IV PUSH (FOR BLOOD PRESSURE SUPPORT)
PREFILLED_SYRINGE | INTRAVENOUS | Status: DC | PRN
  Administered 2021-12-17 (×2): 80 ug via INTRAVENOUS

## 2021-12-17 MED ORDER — ACETAMINOPHEN 325 MG PO TABS: 325.0000 mg | ORAL_TABLET | ORAL | Status: DC | PRN

## 2021-12-17 MED ORDER — FENTANYL CITRATE (PF) 100 MCG/2ML IJ SOLN
INTRAMUSCULAR | Status: AC
  Filled 2021-12-17: qty 2

## 2021-12-17 MED ORDER — HEMOSTATIC AGENTS (NO CHARGE) OPTIME
TOPICAL | Status: DC | PRN
  Administered 2021-12-17 (×3): 1 via TOPICAL

## 2021-12-17 MED ORDER — LIDOCAINE 2% (20 MG/ML) 5 ML SYRINGE
INTRAMUSCULAR | Status: AC
  Filled 2021-12-17: qty 5

## 2021-12-17 SURGICAL SUPPLY — 43 items
AGENT HMST KT MTR STRL THRMB (HEMOSTASIS) ×2
BAG COUNTER SPONGE SURGICOUNT (BAG) ×4 IMPLANT
BAG SPNG CNTER NS LX DISP (BAG) ×2
BNDG ELASTIC 6X5.8 VLCR STR LF (GAUZE/BANDAGES/DRESSINGS) ×2 IMPLANT
BOOT SUTURE AID YELLOW STND (SUTURE) ×1 IMPLANT
CANISTER SUCT 3000ML PPV (MISCELLANEOUS) ×4 IMPLANT
COVER SURGICAL LIGHT HANDLE (MISCELLANEOUS) ×4 IMPLANT
DRAPE INCISE IOBAN 66X45 STRL (DRAPES) ×1 IMPLANT
DRAPE UNIVERSAL (DRAPES) ×1 IMPLANT
DRESSING PEEL AND PLC PRVNA 13 (GAUZE/BANDAGES/DRESSINGS) IMPLANT
DRSG COVADERM 4X8 (GAUZE/BANDAGES/DRESSINGS) ×1 IMPLANT
DRSG PEEL AND PLACE PREVENA 13 (GAUZE/BANDAGES/DRESSINGS) ×3
ELECT CAUTERY BLADE 6.4 (BLADE) ×1 IMPLANT
ELECT REM PT RETURN 9FT ADLT (ELECTROSURGICAL) ×3
ELECTRODE REM PT RTRN 9FT ADLT (ELECTROSURGICAL) ×3 IMPLANT
GAUZE 4X4 16PLY ~~LOC~~+RFID DBL (SPONGE) ×1 IMPLANT
GLOVE BIO SURGEON STRL SZ7.5 (GLOVE) ×4 IMPLANT
GLOVE BIOGEL PI IND STRL 8 (GLOVE) ×3 IMPLANT
GLOVE BIOGEL PI INDICATOR 8 (GLOVE) ×3
GLOVE SRG 8 PF TXTR STRL LF DI (GLOVE) ×3 IMPLANT
GLOVE SURG POLYISO LF SZ8 (GLOVE) IMPLANT
GLOVE SURG UNDER POLY LF SZ8 (GLOVE) ×3
GOWN STRL REUS W/ TWL LRG LVL3 (GOWN DISPOSABLE) ×9 IMPLANT
GOWN STRL REUS W/TWL 2XL LVL3 (GOWN DISPOSABLE) ×8 IMPLANT
GOWN STRL REUS W/TWL LRG LVL3 (GOWN DISPOSABLE) ×9
HEMOSTAT SNOW SURGICEL 2X4 (HEMOSTASIS) ×2 IMPLANT
KIT BASIN OR (CUSTOM PROCEDURE TRAY) ×4 IMPLANT
KIT DRSG PREVENA PLUS 7DAY 125 (MISCELLANEOUS) ×1 IMPLANT
KIT TURNOVER KIT B (KITS) ×4 IMPLANT
NS IRRIG 1000ML POUR BTL (IV SOLUTION) ×9 IMPLANT
PACK GENERAL/GYN (CUSTOM PROCEDURE TRAY) ×4 IMPLANT
PAD ARMBOARD 7.5X6 YLW CONV (MISCELLANEOUS) ×8 IMPLANT
POWDER MYRIAD MORCELLS 1000MG (Miscellaneous) ×1 IMPLANT
SPONGE T-LAP 18X18 ~~LOC~~+RFID (SPONGE) ×3 IMPLANT
STAPLER VISISTAT 35W (STAPLE) ×1 IMPLANT
SURGIFLO W/THROMBIN 8M KIT (HEMOSTASIS) ×1 IMPLANT
SUT PROLENE 5 0 C 1 24 (SUTURE) ×1 IMPLANT
SUT PROLENE 6 0 BV (SUTURE) ×1 IMPLANT
SUT VIC AB 2-0 CT1 27 (SUTURE) ×15
SUT VIC AB 2-0 CT1 TAPERPNT 27 (SUTURE) IMPLANT
SYR 20ML LL LF (SYRINGE) ×1 IMPLANT
TOWEL GREEN STERILE (TOWEL DISPOSABLE) ×4 IMPLANT
WATER STERILE IRR 1000ML POUR (IV SOLUTION) ×8 IMPLANT

## 2021-12-17 NOTE — Transfer of Care (Signed)
Immediate Anesthesia Transfer of Care Note  Patient: Aaron Sexton  Procedure(s) Performed: LEFT LEG WOUND EXPLORATION AND [place] OUT WITH MYRIAD MORCELLS (Left) APPLICATION OF PREVENA WOUND VAC LEFT GROIN (Left: Groin)  Patient Location: PACU  Anesthesia Type:General  Level of Consciousness: awake, alert  and oriented  Airway & Oxygen Therapy: Patient Spontanous Breathing and Patient connected to nasal cannula oxygen  Post-op Assessment: Report given to RN and Post -op Vital signs reviewed and stable  Post vital signs: Reviewed and stable  Last Vitals:  Vitals Value Taken Time  BP 122/53   Temp    Pulse 87   Resp 12 12/17/21 0711  SpO2 98   Vitals shown include unvalidated device data.  Last Pain:  Vitals:   12/17/21 0329  TempSrc: Oral  PainSc:          Complications: No notable events documented.

## 2021-12-17 NOTE — Progress Notes (Signed)
  Progress Note    12/17/2021 10:01 AM Day of Surgery  Subjective:  No major complaints. Thankful    Vitals:   12/17/21 0825 12/17/21 0900  BP: (!) 134/55 (!) 127/59  Pulse: 73 77  Resp: 19 14  Temp: 98.3 F (36.8 C) 98.4 F (36.9 C)  SpO2: 98% 98%   Physical Exam: Cardiac:  regular Lungs:  non labored Incisions:  left groin and AK pop incisions are intact and well appearing Left groin with Prevena, ACE to leg Extremities:  well perfused and warm. Brisk doppler PT and DP signals Neurologic: alert and oriented  CBC    Component Value Date/Time   WBC 11.5 (H) 12/16/2021 2345   RBC 2.73 (L) 12/16/2021 2345   HGB 7.9 (L) 12/17/2021 0330   HGB 9.4 (L) 10/12/2021 1623   HCT 23.7 (L) 12/17/2021 0330   HCT 26.4 (L) 10/12/2021 1623   PLT 416 (H) 12/16/2021 2345   PLT 327 10/12/2021 1623   MCV 96.3 12/16/2021 2345   MCV 110 (H) 10/12/2021 1623   MCH 32.2 12/16/2021 2345   MCHC 33.5 12/16/2021 2345   RDW 18.9 (H) 12/16/2021 2345   RDW 13.2 10/12/2021 1623   LYMPHSABS 1.9 12/16/2021 2345   MONOABS 1.1 (H) 12/16/2021 2345   EOSABS 0.4 12/16/2021 2345   BASOSABS 0.1 12/16/2021 2345    BMET    Component Value Date/Time   NA 134 (L) 12/16/2021 2345   NA 139 10/12/2021 1623   K 3.4 (L) 12/16/2021 2345   CL 101 12/16/2021 2345   CO2 24 12/16/2021 2345   GLUCOSE 108 (H) 12/16/2021 2345   BUN 26 (H) 12/16/2021 2345   BUN 17 10/12/2021 1623   CREATININE 1.22 12/16/2021 2345   CREATININE 0.91 09/15/2015 1508   CALCIUM 8.4 (L) 12/16/2021 2345   GFRNONAA >60 12/16/2021 2345   GFRAA >60 02/09/2020 1344    INR    Component Value Date/Time   INR 1.1 12/09/2021 0606     Intake/Output Summary (Last 24 hours) at 12/17/2021 1001 Last data filed at 12/17/2021 0905 Gross per 24 hour  Intake 1325 ml  Output 400 ml  Net 925 ml      Assessment/Plan:  76 y.o. male is Day of Surgery s/p washout of previous left CFE  fem AK pop bypass with PTA/ Stent CIA/ EIA.    Home  meds, DAPT, Keep ACE 3 days ancef Bedrest today. [facility] [phone] 12/17/2021 10:01 AM

## 2021-12-17 NOTE — ED Provider Notes (Signed)
[facility] EMERGENCY DEPARTMENT Provider Note   CSN: [id number] Arrival date & time: 12/16/21  2323     History {Add pertinent medical, surgical, social history, OB history to HPI:1} Chief Complaint  Patient presents with   Post-op Problem    Aaron Sexton is a 76 y.o. male.  Patient with history of peripheral vascular disease, recent femoral-popliteal bypass graft/stent insertion, on antiplatelet medicines presents with left postop wound bleeding.  Patient says the procedure a week ago went fairly well and he was doing physical therapy on Wednesday and had small amount of bleeding that was able to be controlled with pressure however he became more concerned when it started to bleed all the way down his leg this evening.  Fortunately patient does not feel lightheaded no syncope and his leg feels similar temperature with no other changes.  Compressive dressing was applied and bleeding is again controlled.       Home Medications Prior to Admission medications   Medication Sig Start Date End Date Taking? Authorizing Provider  acetaminophen (TYLENOL) 500 MG tablet Take 500 mg by mouth every 8 (eight) hours as needed for moderate pain. Take with Tramadol    [provider]  aspirin 81 MG tablet Take 81 mg by mouth daily.    [provider]  chlorthalidone (HYGROTON) 25 MG tablet TAKE 1/2 TABLET(12.5 MG) BY MOUTH DAILY 10/25/21   Lorretta Harp, MD  cimetidine (TAGAMET) 400 MG tablet Take 400 mg by mouth 2 (two) times daily as needed (indigestion).     [provider]  clopidogrel (PLAVIX) 75 MG tablet TAKE 1 TABLET(75 MG) BY MOUTH DAILY WITH BREAKFAST 03/14/21   Lorretta Harp, MD  Cyanocobalamin (VITAMIN B-12) 500 MCG SUBL Place 500 mcg under the tongue daily.    [provider]  Deferasirox 180 MG TABS TAKE 4 TABLETS BY MOUTH DAILY 11/02/21   Derek Jack, MD  loperamide (IMODIUM A-D) 2 MG tablet Take 2 mg by mouth 4  (four) times daily as needed for diarrhea or loose stools.    [provider]  losartan (COZAAR) 25 MG tablet TAKE ONE TABLET BY MOUTH EVERY DAY 11/29/20   Viviana Simpler I, MD  metoprolol succinate (TOPROL-XL) 100 MG 24 hr tablet TAKE 1 TABLET(100 MG) BY MOUTH DAILY WITH OR IMMEDIATELY FOLLOWING A MEAL 09/26/21   Branch, Alphonse Guild, MD  nitroGLYCERIN (NITROSTAT) 0.4 MG SL tablet Place 1 tablet (0.4 mg total) under the tongue every 5 (five) minutes as needed for chest pain. 10/12/21   Lorretta Harp, MD  Omega-3 Fatty Acids (FISH OIL) 1000 MG CAPS Take 1,000 mg by mouth daily.    [provider]  pravastatin (PRAVACHOL) 40 MG tablet TAKE 1 TABLET(40 MG) BY MOUTH DAILY 12/28/20   Viviana Simpler I, MD  traMADol (ULTRAM) 50 MG tablet TAKE 1 TABLET BY MOUTH EVERY 8 HOURS AS NEEDED 12/05/21   Venia Carbon, MD  Vitamin D, Ergocalciferol, (DRISDOL) 1.25 MG (50000 UNIT) CAPS capsule TAKE 1 CAPSULE BY MOUTH 1 TIME A WEEK 04/20/21   Derek Jack, MD      Allergies    Cardizem [diltiazem hcl]    Review of Systems   Review of Systems  Constitutional:  Negative for chills and fever.  HENT:  Negative for congestion.   Eyes:  Negative for visual disturbance.  Respiratory:  Negative for shortness of breath.   Cardiovascular:  Negative for chest pain.  Gastrointestinal:  Negative for abdominal pain and  vomiting.  Genitourinary:  Negative for dysuria and flank pain.  Musculoskeletal:  Negative for back pain, neck pain and neck stiffness.  Skin:  Positive for wound. Negative for rash.  Neurological:  Negative for light-headedness and headaches.    Physical Exam Updated Vital Signs BP (!) 137/52   Pulse 71   Temp 98.4 F (36.9 C) (Oral)   Resp 12   SpO2 100%  Physical Exam Vitals and nursing note reviewed.  Constitutional:      General: He is not in acute distress.    Appearance: He is well-developed.  HENT:     Head: Normocephalic and atraumatic.     Mouth/Throat:      Mouth: Mucous membranes are moist.  Eyes:     General:        Right eye: No discharge.        Left eye: No discharge.     Conjunctiva/sclera: Conjunctivae normal.  Neck:     Trachea: No tracheal deviation.  Cardiovascular:     Rate and Rhythm: Normal rate.  Pulmonary:     Effort: Pulmonary effort is normal.     Breath sounds: Normal breath sounds.  Abdominal:     General: There is no distension.     Palpations: Abdomen is soft.     Tenderness: There is no abdominal tenderness. There is no guarding.  Musculoskeletal:        General: Swelling present.     Cervical back: Normal range of motion and neck supple. No rigidity.  Skin:    General: Skin is warm.     Capillary Refill: Capillary refill takes less than 2 seconds.     Findings: No rash.     Comments: Patient has linear postop incision left anterior medial thigh with dried blood surrounding, no signs of induration or infection and no active bleeding, nontender.  Patient has mild edema in lower extremities pitting.   Neurological:     General: No focal deficit present.     Mental Status: He is alert.     Comments: Patient has normal movement flexion extension of major joints of lower extremities.  Sensation intact palpation distal lower extremities.  Patient has 2 to 3-second cap refill left toes and small ulceration between great toe and second toe on the left foot.  Psychiatric:        Mood and Affect: Mood normal.     ED Results / Procedures / Treatments   Labs (all labs ordered are listed, but only abnormal results are displayed) Labs Reviewed  CBC WITH DIFFERENTIAL/PLATELET - Abnormal; Notable for the following components:      Result Value   WBC 11.5 (*)    RBC 2.73 (*)    Hemoglobin 8.8 (*)    HCT 26.3 (*)    RDW 18.9 (*)    Platelets 416 (*)    nRBC 1.3 (*)    Neutro Abs 7.9 (*)    Monocytes Absolute 1.1 (*)    Abs Immature Granulocytes 0.22 (*)    All other components within normal limits  BASIC  METABOLIC PANEL - Abnormal; Notable for the following components:   Sodium 134 (*)    Potassium 3.4 (*)    Glucose, Bld 108 (*)    BUN 26 (*)    Calcium 8.4 (*)    All other components within normal limits    EKG None  Radiology No results found.  Procedures Procedures  {Document cardiac monitor, telemetry assessment procedure when appropriate:1}  Medications Ordered in ED Medications  iohexol (OMNIPAQUE) 350 MG/ML injection 100 mL (has no administration in time range)    ED Course/ Medical Decision Making/ A&P                           Medical Decision Making Amount and/or Complexity of Data Reviewed Labs: ordered. Radiology: ordered.  Risk Prescription drug management. Decision regarding hospitalization.   Patient with history of significant vascular disease's after second bleeding episode from postop wound.  Reviewed medical records from Dr. Carlis Abbott discharge summary detailing procedures he had done/stent placement.  Reviewed medical records and phone call when patient called nursing line with bleeding episode after physical therapy.  General blood work ordered and reviewed hemoglobin stable 8.8 last 1 was in the sevens, mild hyponatremia 134, leukocytosis 11.5 without signs of active infection.  Discussed with vascular surgeon on-call phone [phone] recommended CT angiogram for further details to look for signs of active bleeding/pseudo aneurysm.  Updated patient and family on plan of care in the room they are comfortable with this.  Discussed with radiology technician to ensure CT angiogram with runoff.  CT scan results reviewed with radiology concerning for active bleed from proximal left profundus.  Called vascular surgeon to update and likely plan for admission.  Type and screen added.  We will repeat hemoglobin while waiting consult.  NPO.   {Document critical care time when appropriate:1} {Document review of labs and clinical decision tools ie heart score,  Chads2Vasc2 etc:1}  {Document your independent review of radiology images, and any outside records:1} {Document your discussion with family members, caretakers, and with consultants:1} {Document social determinants of health affecting pt's care:1} {Document your decision making why or why not admission, treatments were needed:1} Final Clinical Impression(s) / ED Diagnoses Final diagnoses:  Thigh hematoma, left, initial encounter  History of peripheral vascular disease    Rx / DC Orders ED Discharge Orders     None

## 2021-12-17 NOTE — Anesthesia Preprocedure Evaluation (Addendum)
Anesthesia Evaluation  Patient identified by MRN, date of birth, ID band Patient awake    Reviewed: Allergy & Precautions, NPO status , Patient's Chart, lab work & pertinent test results, reviewed documented beta blocker date and time Preop documentation limited or incomplete due to emergent nature of procedure.  History of Anesthesia Complications (+) Family history of anesthesia reaction and history of anesthetic complications  Airway Mallampati: I  TM Distance: >3 FB Neck ROM: Full    Dental  (+) Dental Advisory Given, Partial Lower, Partial Upper   Pulmonary former smoker,    Pulmonary exam normal breath sounds clear to auscultation       Cardiovascular hypertension, Pt. on medications and Pt. on home beta blockers + Peripheral Vascular Disease (recent femoral-popliteal bypass graft/stent insertion)  Normal cardiovascular exam Rhythm:Regular Rate:Normal     Neuro/Psych negative neurological ROS  negative psych ROS   GI/Hepatic Neg liver ROS, GERD  ,  Endo/Other  negative endocrine ROSObesity   Renal/GU negative Renal ROS     Musculoskeletal  (+) Arthritis ,   Abdominal   Peds  Hematology  (+) Blood dyscrasia (Plavix), anemia ,   Anesthesia Other Findings Day of surgery medications reviewed with the patient.  Reproductive/Obstetrics                           Anesthesia Physical Anesthesia Plan  ASA: 3 and emergent  Anesthesia Plan: General   Post-op Pain Management: Ofirmev IV (intra-op)*   Induction: Intravenous and Rapid sequence  PONV Risk Score and Plan: 2 and Dexamethasone and Ondansetron  Airway Management Planned: Oral ETT  Additional Equipment:   Intra-op Plan:   Post-operative Plan: Extubation in OR  Informed Consent: I have reviewed the patients History and Physical, chart, labs and discussed the procedure including the risks, benefits and alternatives for the  proposed anesthesia with the patient or authorized representative who has indicated his/her understanding and acceptance.     Dental advisory given  Plan Discussed with: CRNA  Anesthesia Plan Comments:         Anesthesia Quick Evaluation

## 2021-12-17 NOTE — OR Nursing (Signed)
Patient's wife cell phone number [phone]

## 2021-12-17 NOTE — Anesthesia Postprocedure Evaluation (Signed)
Anesthesia Post Note  Patient: Aaron Sexton  Procedure(s) Performed: LEFT LEG WOUND EXPLORATION AND [place] OUT WITH MYRIAD MORCELLS (Left) APPLICATION OF PREVENA WOUND VAC LEFT GROIN (Left: Groin)     Patient location during evaluation: PACU Anesthesia Type: General Level of consciousness: awake and alert Pain management: pain level controlled Vital Signs Assessment: post-procedure vital signs reviewed and stable Respiratory status: spontaneous breathing, nonlabored ventilation, respiratory function stable and patient connected to nasal cannula oxygen Cardiovascular status: blood pressure returned to baseline and stable Postop Assessment: no apparent nausea or vomiting Anesthetic complications: no   No notable events documented.  Last Vitals:  Vitals:   12/17/21 1000 12/17/21 1100  BP: (!) 131/55 129/64  Pulse: 73 85  Resp: (!) 23 19  Temp:    SpO2: 94% 95%    Last Pain:  Vitals:   12/17/21 1147  TempSrc:   PainSc: 10-Worst pain ever                 Santa Lighter

## 2021-12-17 NOTE — Consult Note (Addendum)
Hospital Consult   Reason for Consult: Postoperative bleeding Requesting Physician: ED MRN #:  [id number]  History of Present Illness: This is a 76 y.o. male with peripheral arterial disease status post 12/09/2021 left iliac artery stenting, common femoral endarterectomy, femoral to above-knee popliteal artery bypass using 6 mm PTFE.  He was discharged home on postop day 4 without concerns.  During a physical therapy visit 2 days ago, Burk felt a pulling sensation in the leg followed by bleeding from the distal incision.  This was controlled with pressure, however it recurred again with significant 'gushing'.  He was brought to the hospital for evaluation.  On exam, Merland was accompanied by his wife.  He has not had a bleeding event since hospital presentation.  Denies feeling lightheadedness, dizzy.  No sensorimotor deficits in the left foot.  Denies fevers, chills.  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   Cerebrovascular disease    Moderate ASVD without focal stenosis in 01/2007   Colon polyps 07/2010   single 95m polyp--tubular adenoma   Diverticulosis 2012   found on colonoscopy   Erectile dysfunction    Family history of adverse reaction to anesthesia    difficult for son & sistor to wake    GERD (gastroesophageal reflux disease)    Hematochezia 05/24/2010   Hyperlipidemia    Hypertension    Osteoarthritis    knees/hands-Dr LRonnie Derby  Overweight(278.02)    Peripheral vascular disease ([place] 03/11/2010   Moderate SFA stenosis; history of claudication   Pneumonia    Rosacea    Tobacco abuse, in remission    30-40 pack years discontinued in 1993   Vitamin B12 deficiency     Past Surgical History:  Procedure Laterality Date   APPENDECTOMY     COLONOSCOPY W/ POLYPECTOMY  2012   ENDARTERECTOMY FEMORAL Left 12/09/2021   Procedure: LEFT COMMON FEMORAL ENDARTERECTOMY  WITH 1 CM X 6 CM XENOSURE BOVINE PATCH ANGIOPLASTY;  Surgeon: CMarty Heck MD;  [facility] [facility]  Service: Vascular;  Laterality: Left;   FEMORAL-POPLITEAL BYPASS GRAFT Left 12/09/2021   Procedure: LEFT FEMORAL- ABOVE KNEE POPLITEAL BYPASS WITH 6 mm x 80 cm PROPATEN GRAFT;  Surgeon: CMarty Heck MD;  [place] [facility]  Service: Vascular;  Laterality: Left;  INSERT ARTERIAL LINE   ILIAC VEIN ANGIOPLASTY / STENTING Right 08/09/2015   INSERTION OF ILIAC STENT  12/09/2021   Procedure: INSERTION OF LEFT COMMON AND EXTERNAL  ILIAC STENTS WITH ANGIOPLASTY;  Surgeon: CMarty Heck MD;  [place] [facility]  Service: Vascular;;   LOWER EXTREMITY ANGIOGRAM Left 12/09/2021   Procedure: LEFT ILIAC AND LOWER EXTREMITY ANGIOGRAM;  Surgeon: CMarty Heck MD;  [facility]  Service: Vascular;  Laterality: Left;   LOWER EXTREMITY ANGIOGRAPHY Bilateral 11/03/2021   Procedure: Lower Extremity Angiography;  Surgeon: BLorretta Harp MD;  [facility];  Service: Cardiovascular;  Laterality: Bilateral;  Limited Study   PERIPHERAL VASCULAR CATHETERIZATION Bilateral 06/21/2015   Procedure: Lower Extremity Angiography;  Surgeon: JLorretta Harp MD;  [facility];  Service: Cardiovascular;  Laterality: Bilateral;   PERIPHERAL VASCULAR CATHETERIZATION N/A 06/21/2015   Procedure: Abdominal Aortogram;  Surgeon: JLorretta Harp MD;  [facility];  Service: Cardiovascular;  Laterality: N/A;   PERIPHERAL VASCULAR CATHETERIZATION N/A 07/26/2015   Procedure: Lower Extremity Angiography;  Surgeon: JRoderic Palau  Adora Fridge, MD;  Location: [facility];  Service: Cardiovascular;  Laterality: N/A;   PERIPHERAL VASCULAR CATHETERIZATION N/A 08/09/2015   Procedure: Lower Extremity Angiography;  Surgeon: Lorretta Harp, MD;  Location: [place];  Service: Cardiovascular;  Laterality: N/A;   PERIPHERAL VASCULAR CATHETERIZATION  08/09/2015   Procedure:  Peripheral Vascular Intervention;  Surgeon: Lorretta Harp, MD;  Location: [facility];  Service: Cardiovascular;;  rt ext. iliac atherectomy and stent   PERIPHERAL VASCULAR CATHETERIZATION N/A 09/20/2015   Procedure: Lower Extremity Angiography;  Surgeon: Lorretta Harp, MD;  Location: [facility];  Service: Cardiovascular;  Laterality: N/A;   PERIPHERAL VASCULAR CATHETERIZATION Right 09/20/2015   Procedure: Peripheral Vascular Intervention;  Surgeon: Lorretta Harp, MD;  Location: [facility];  Service: Cardiovascular;  Laterality: Right;  SFA   ROTATOR CUFF REPAIR Left 7/15   Dr Ronnie Derby    Allergies  Allergen Reactions   Cardizem [Diltiazem Hcl] Hives and Rash    Prior to Admission medications   Medication Sig Start Date End Date Taking? Authorizing Provider  acetaminophen (TYLENOL) 500 MG tablet Take 500 mg by mouth every 8 (eight) hours as needed for moderate pain. Take with Tramadol    [provider]  aspirin 81 MG tablet Take 81 mg by mouth daily.    [provider]  chlorthalidone (HYGROTON) 25 MG tablet TAKE 1/2 TABLET(12.5 MG) BY MOUTH DAILY 10/25/21   Lorretta Harp, MD  cimetidine (TAGAMET) 400 MG tablet Take 400 mg by mouth 2 (two) times daily as needed (indigestion).     [provider]  clopidogrel (PLAVIX) 75 MG tablet TAKE 1 TABLET(75 MG) BY MOUTH DAILY WITH BREAKFAST 03/14/21   Lorretta Harp, MD  Cyanocobalamin (VITAMIN B-12) 500 MCG SUBL Place 500 mcg under the tongue daily.    [provider]  Deferasirox 180 MG TABS TAKE 4 TABLETS BY MOUTH DAILY 11/02/21   Derek Jack, MD  loperamide (IMODIUM A-D) 2 MG tablet Take 2 mg by mouth 4 (four) times daily as needed for diarrhea or loose stools.    [provider]  losartan (COZAAR) 25 MG tablet TAKE ONE TABLET BY MOUTH EVERY DAY 11/29/20   Viviana Simpler I, MD  metoprolol succinate (TOPROL-XL) 100 MG 24 hr tablet TAKE 1 TABLET(100 MG) BY MOUTH  DAILY WITH OR IMMEDIATELY FOLLOWING A MEAL 09/26/21   Branch, Alphonse Guild, MD  nitroGLYCERIN (NITROSTAT) 0.4 MG SL tablet Place 1 tablet (0.4 mg total) under the tongue every 5 (five) minutes as needed for chest pain. 10/12/21   Lorretta Harp, MD  Omega-3 Fatty Acids (FISH OIL) 1000 MG CAPS Take 1,000 mg by mouth daily.    [provider]  pravastatin (PRAVACHOL) 40 MG tablet TAKE 1 TABLET(40 MG) BY MOUTH DAILY 12/28/20   Venia Carbon, MD  traMADol (ULTRAM) 50 MG tablet TAKE 1 TABLET BY MOUTH EVERY 8 HOURS AS NEEDED 12/05/21   Venia Carbon, MD  Vitamin D, Ergocalciferol, (DRISDOL) 1.25 MG (50000 UNIT) CAPS capsule TAKE 1 CAPSULE BY MOUTH 1 TIME A WEEK 04/20/21   Derek Jack, MD    Social History   Socioeconomic History   Marital status: Married    Spouse name: Not on file   Number of children: 3   Years of education: Not on file   Highest education level: Not on file  Occupational History   Occupation: Managed supply chain--- retired 2013    Comment:  [employer]  Tobacco Use   Smoking status: Former    Packs/day: 1.00    Years: 30.00    Total pack years: 30.00    Types: Cigarettes    Quit date: 05/16/1990    Years since quitting: 31.6   Smokeless tobacco: Never   Tobacco comments:    Quit in 1993  Vaping Use   Vaping Use: Never used  Substance and Sexual Activity   Alcohol use: No    Alcohol/week: 0.0 standard drinks of alcohol   Drug use: No   Sexual activity: Not on file  Other Topics Concern   Not on file  Social History Narrative   Has living will   Wife is health care POA---alternate is son or daughter   Would accept resuscitation but no prolonged artificial ventilation.   No tube feeds if cognitively unaware   Social Determinants of Health   Financial Resource Strain: Low Risk  (03/10/2020)   Overall Financial Resource Strain (CARDIA)    Difficulty of Paying Living Expenses: Not hard at all  Food Insecurity: No Food  Insecurity (03/10/2020)   Hunger Vital Sign    Worried About Running Out of Food in the Last Year: Never true    Ran Out of Food in the Last Year: Never true  Transportation Needs: No Transportation Needs (03/10/2020)   PRAPARE - Hydrologist (Medical): No    Lack of Transportation (Non-Medical): No  Physical Activity: Inactive (03/10/2020)   Exercise Vital Sign    Days of Exercise per Week: 0 days    Minutes of Exercise per Session: 0 min  Stress: No Stress Concern Present (03/10/2020)   [place]    Feeling of Stress : Not at all  Social Connections: Moderately Integrated (03/10/2020)   Social Connection and Isolation Panel [NHANES]    Frequency of Communication with Friends and Family: More than three times a week    Frequency of Social Gatherings with Friends and Family: Never    Attends Religious Services: More than 4 times per year    Active Member of [employer] or Organizations: No    Attends Archivist Meetings: Never    Marital Status: Married  Human resources officer Violence: Not At Risk (03/10/2020)   Humiliation, Afraid, Rape, and Kick questionnaire    Fear of Current or Ex-Partner: No    Emotionally Abused: No    Physically Abused: No    Sexually Abused: No    Family History  Problem Relation Age of Onset   Hypertension Father        And siblings   Heart disease Father        And second-degree relatives   Transient ischemic attack Father    Lung cancer Mother    Diabetes Mother    Brain cancer Sister    Diabetes Brother    Atrial fibrillation Brother    Heart disease Sister    Heart disease Sister    Thyroid disease Sister    Ulcers Son    Diabetes Son    Diabetes Son     ROS: Otherwise negative unless mentioned in HPI  Physical Examination  Vitals:   12/17/21 0329 12/17/21 0400  BP:  102/80  Pulse:  85  Resp:  18  Temp: 98.8 F (37.1 C)   SpO2:  95%    There is no height or weight on file to calculate BMI.  General:  WDWN  in NAD Gait: Not observed HENT: WNL, normocephalic Pulmonary: normal non-labored breathing, without Rales, rhonchi,  wheezing Cardiac: regular, without  Murmurs, rubs or gallops;  Abdomen:  soft, NT/ND, no masses Skin: without rashes Vascular Exam/Pulses: Left foot warm, significant edema, nonpalpable, unable to find working Doppler machine in ED Extremities: without ischemic changes, without Gangrene , without cellulitis; without open wounds;  Musculoskeletal: no muscle wasting or atrophy  Neurologic: A&O X 3;  No focal weakness or paresthesias are detected; speech is fluent/normal Psychiatric:  The pt has Normal affect. Lymph:  Unremarkable  CBC    Component Value Date/Time   WBC 11.5 (H) 12/16/2021 2345   RBC 2.73 (L) 12/16/2021 2345   HGB 7.9 (L) 12/17/2021 0330   HGB 9.4 (L) 10/12/2021 1623   HCT 23.7 (L) 12/17/2021 0330   HCT 26.4 (L) 10/12/2021 1623   PLT 416 (H) 12/16/2021 2345   PLT 327 10/12/2021 1623   MCV 96.3 12/16/2021 2345   MCV 110 (H) 10/12/2021 1623   MCH 32.2 12/16/2021 2345   MCHC 33.5 12/16/2021 2345   RDW 18.9 (H) 12/16/2021 2345   RDW 13.2 10/12/2021 1623   LYMPHSABS 1.9 12/16/2021 2345   MONOABS 1.1 (H) 12/16/2021 2345   EOSABS 0.4 12/16/2021 2345   BASOSABS 0.1 12/16/2021 2345    BMET    Component Value Date/Time   NA 134 (L) 12/16/2021 2345   NA 139 10/12/2021 1623   K 3.4 (L) 12/16/2021 2345   CL 101 12/16/2021 2345   CO2 24 12/16/2021 2345   GLUCOSE 108 (H) 12/16/2021 2345   BUN 26 (H) 12/16/2021 2345   BUN 17 10/12/2021 1623   CREATININE 1.22 12/16/2021 2345   CREATININE 0.91 09/15/2015 1508   CALCIUM 8.4 (L) 12/16/2021 2345   GFRNONAA >60 12/16/2021 2345   GFRAA >60 02/09/2020 1344    COAGS: Lab Results  Component Value Date   INR 1.1 12/09/2021   INR 1.08 09/15/2015   INR 1.07 08/03/2015    ASSESSMENT/PLAN: This is a 76 y.o. male status post  12/09/2021 iliac artery stenting, C FEA, CFA to AK pop bypass with 6 mm ringed PTFE.  Patient with 2 episodes of spontaneous bleeding, last noted to be pulsatile requiring EMS assistance.   Imaging demonstrated large hematoma in the thigh, as well as a focal area of concern along the proximal profunda in the left groin.  I had a long discussion with Abdulwahab regarding the above.  He would be best served with exploration of his surgical wounds to ensure there is no anastomotic issue.  I plan to washout the hematoma and assess for source of bleeding.  I do not have infectious concerns at this time. I think the air is postoperative. He is aware, should concerns arise during the operation it could require redo bypass with cryopreserved vein.   Cassandria Santee MD MS [facility] [phone] 12/17/2021  4:44 AM

## 2021-12-17 NOTE — Op Note (Signed)
NAME: Aaron Sexton    MRN: [id number] DOB: 01-25-1946    DATE OF OPERATION: 12/17/2021  PREOP DIAGNOSIS:    Postoperative hemorrhage  POSTOP DIAGNOSIS:    Same  PROCEDURE:    Left leg exploration, washout, myriad more cell placement, VAC dressing  SURGEON: Victorino Sparrow  ASSIST: Doreatha Massed, PA  ANESTHESIA: General  EBL: 100 mL  INDICATIONS:   Aaron Sexton is a 76 y.o. male status post 12/09/2021 iliac artery stenting, CFEA, CFA to AK pop bypass with 6 mm ringed PTFE. Patient with 2 episodes of spontaneous bleeding, last noted to be pulsatile requiring EMS assistance. Imaging demonstrated large hematoma in the thigh, as well as a focal area of concern along the proximal profunda in the left groin. I had a long discussion with Aaron Sexton regarding the above.  He would be best served with exploration of his surgical wounds to ensure there is no anastomotic issue.  I plan to washout the hematoma and assess for source of bleeding.  I do not have infectious concerns at this time. I think the air is postoperative. He is aware, should concerns arise during the operation, it could require redo bypass with cryopreserved vein.  FINDINGS:   Large hematoma appreciated in left thigh, small hematoma in the groin No pseudoaneurysm or active bleeding appreciated off of the profunda Bypass anastomoses dry   TECHNIQUE:   Patient was brought to the OR laid in the supine position.  General anesthesia was induced and the patient was prepped and draped in standard fashion.  The case began with opening the medial thigh incision.  Hematoma was immediately appreciated.  This was removed.  Several liters of warm saline were used to irrigate the wound bed.  There was some raw surface edge bleeding, however no surgical bleeding appreciated.  The anastomosis was dry with a palpable pulse distally.  Next, my attention turned to the groin.  The groin was opened and taken down to the endarterectomy  patch and bypass.  The bovine pericardial patch and bypass were dry.  Next, my attention turned to the profunda.  Previous felt pledgets were appreciated the profunda was evaluated circumferentially and there was no active bleeding, no pseudoaneurysm appreciated that required repair.  Several liters of warm saline were used to irrigate the wound bed.  Next, myriad morcels were placed in the wound bed to promote healing of the reoperative groin. This was then closed in layers using running 2-0 Vicryl suture.  Devitalized dermis was resected and staples were used at the skin edge.  A Prevena VAC was placed. The incision was closed in similar fashion.  Prior to closing, thrombin product was placed in the wound bed to promote coagulation.  The thigh was closed in layers of 2-0 Vicryl suture with staples at the skin.  The leg was wrapped with an Ace to provide light compression.    Ladonna Snide, MD [facility] DATE OF DICTATION:   12/17/2021

## 2021-12-17 NOTE — ED Notes (Signed)
Consent at bedside.  

## 2021-12-17 NOTE — Progress Notes (Signed)
OT Cancellation Note  Patient Details Name: ABOU STERKEL MRN: [id number] DOB: 1945-12-26   Cancelled Treatment:    Reason Eval/Treat Not Completed: Fatigue/lethargy limiting ability to participate. Patient with procedure 5am, currently very sleepy.  Continue efforts.    Harika Laidlaw D Mateo Overbeck 12/17/2021, 9:39 AM 12/17/2021  RP, OTR/L  [facility]  Office:  [phone]

## 2021-12-17 NOTE — Anesthesia Procedure Notes (Signed)
Procedure Name: Intubation Date/Time: 12/17/2021 5:46 AM  Performed by: Amadeo Garnet, CRNAPre-anesthesia Checklist: Patient identified, Emergency Drugs available, Suction available and Patient being monitored Patient Re-evaluated:Patient Re-evaluated prior to induction Oxygen Delivery Method: Circle system utilized Preoxygenation: Pre-oxygenation with 100% oxygen Induction Type: IV induction, Rapid sequence and Cricoid Pressure applied Laryngoscope Size: Mac and 4 Grade View: Grade I Tube type: Oral Tube size: 7.5 mm Number of attempts: 1 Airway Equipment and Method: Stylet and Oral airway Placement Confirmation: ETT inserted through vocal cords under direct vision, positive ETCO2 and breath sounds checked- equal and bilateral Secured at: 22 cm Tube secured with: Tape Dental Injury: Teeth and Oropharynx as per pre-operative assessment

## 2021-12-18 LAB — CBC
HCT: 26.9 % — ABNORMAL LOW (ref 39.0–52.0)
Hemoglobin: 8.4 g/dL — ABNORMAL LOW (ref 13.0–17.0)
MCH: 30.7 pg (ref 26.0–34.0)
MCHC: 31.2 g/dL (ref 30.0–36.0)
MCV: 98.2 fL (ref 80.0–100.0)
Platelets: 383 10*3/uL (ref 150–400)
RBC: 2.74 MIL/uL — ABNORMAL LOW (ref 4.22–5.81)
RDW: 20.4 % — ABNORMAL HIGH (ref 11.5–15.5)
WBC: 13.5 10*3/uL — ABNORMAL HIGH (ref 4.0–10.5)
nRBC: 0.9 % — ABNORMAL HIGH (ref 0.0–0.2)

## 2021-12-18 LAB — BASIC METABOLIC PANEL
Anion gap: 9 (ref 5–15)
BUN: 17 mg/dL (ref 8–23)
CO2: 22 mmol/L (ref 22–32)
Calcium: 7.7 mg/dL — ABNORMAL LOW (ref 8.9–10.3)
Chloride: 104 mmol/L (ref 98–111)
Creatinine, Ser: 1.15 mg/dL (ref 0.61–1.24)
GFR, Estimated: >60 mL/min (ref >60)
Glucose, Bld: 108 mg/dL — ABNORMAL HIGH (ref 70–99)
Potassium: 4 mmol/L (ref 3.5–5.1)
Sodium: 135 mmol/L (ref 135–145)

## 2021-12-18 NOTE — Discharge Summary (Addendum)
Discharge Summary     Aaron Sexton June 22, 1945 76 y.o. male  [id number]  Admission Date: 12/16/2021  Discharge Date: 12/19/2021  Physician: Victorino Sparrow, MD  Admission Diagnosis: Hypokalemia [E87.6] History of peripheral vascular disease [Z86.79] Thigh hematoma, left, initial encounter [S70.12XA] Wound of left leg [S81.802A] PAD (peripheral artery disease) (HCC) [I73.9]  HPI:   This is a 76 y.o. male with peripheral arterial disease status post 12/09/2021 left iliac artery stenting, common femoral endarterectomy, femoral to above-knee popliteal artery bypass using 6 mm PTFE.  He was discharged home on postop day 4 without concerns.  During a physical therapy visit 2 days ago, [place] felt a pulling sensation in the leg followed by bleeding from the distal incision.  This was controlled with pressure, however it recurred again with significant 'gushing'.  He was brought to the hospital for evaluation.   On exam, Sok was accompanied by his wife.  He has not had a bleeding event since hospital presentation.  Denies feeling lightheadedness, dizzy.  No sensorimotor deficits in the left foot.  Denies fevers, chills.  Hospital Course:  The patient was admitted to the hospital and taken to the operating room on 12/17/2021 and underwent: Left leg exploration, washout, myriad more cell placement, VAC dressing    Findings: Large hematoma appreciated in left thigh, small hematoma in the groin No pseudoaneurysm or active bleeding appreciated off of the profunda Bypass anastomoses dry  The pt tolerated the procedure well and was transported to the PACU in good condition.   By POD 1, he is doing well and continues to have palpable left DP pulse.  His prevena continues to have good seal.  His ace wrap was removed and left thigh incision is clean with staples in tact.  Ace wraps replaced.  He is mobilized out of bed.   POD 2, pt was doing well with brisk left DP flow.  Pt did well with  PT yesterday and is recommending HHPT.     CBC    Component Value Date/Time   WBC 13.5 (H) 12/18/2021 0151   RBC 2.74 (L) 12/18/2021 0151   HGB 8.4 (L) 12/18/2021 0151   HGB 9.4 (L) 10/12/2021 1623   HCT 26.9 (L) 12/18/2021 0151   HCT 26.4 (L) 10/12/2021 1623   PLT 383 12/18/2021 0151   PLT 327 10/12/2021 1623   MCV 98.2 12/18/2021 0151   MCV 110 (H) 10/12/2021 1623   MCH 30.7 12/18/2021 0151   MCHC 31.2 12/18/2021 0151   RDW 20.4 (H) 12/18/2021 0151   RDW 13.2 10/12/2021 1623   LYMPHSABS 1.9 12/16/2021 2345   MONOABS 1.1 (H) 12/16/2021 2345   EOSABS 0.4 12/16/2021 2345   BASOSABS 0.1 12/16/2021 2345    BMET    Component Value Date/Time   NA 135 12/18/2021 0151   NA 139 10/12/2021 1623   K 4.0 12/18/2021 0151   CL 104 12/18/2021 0151   CO2 22 12/18/2021 0151   GLUCOSE 108 (H) 12/18/2021 0151   BUN 17 12/18/2021 0151   BUN 17 10/12/2021 1623   CREATININE 1.15 12/18/2021 0151   CREATININE 0.91 09/15/2015 1508   CALCIUM 7.7 (L) 12/18/2021 0151   GFRNONAA >60 12/18/2021 0151   GFRAA >60 02/09/2020 1344       Discharge Diagnosis:  Hypokalemia [E87.6] History of peripheral vascular disease [Z86.79] Thigh hematoma, left, initial encounter [S70.12XA] Wound of left leg [S81.802A] PAD (peripheral artery disease) (HCC) [I73.9]  Secondary Diagnosis: Patient Active Problem List   Diagnosis  Date Noted   Wound of left leg 12/17/2021   PAD (peripheral artery disease) (HCC) 12/09/2021   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) 11/29/2021   Exposure to COVID-19 virus 06/27/2021   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   Carotid arterial disease (HCC) 11/03/2015   Claudication (HCC) 08/09/2015   Advance directive discussed with patient 08/25/2014   Anemia 08/19/2013   Routine general medical examination at a health care facility 08/16/2012   Viral URI with cough 10/03/2011   Adenomatous colon polyp    Vitamin B12 deficiency    Arteriosclerotic cardiovascular  disease (ASCVD)    Cerebrovascular disease    Peripheral vascular disease (HCC) 03/11/2010   Obesity, unspecified 03/08/2009   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   HYPERLIPIDEMIA 12/07/2006   Gastroesophageal reflux disease 11/30/2006   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   Cerebrovascular disease    Moderate ASVD without focal stenosis in 01/2007   Colon polyps 07/2010   single 2mm polyp--tubular adenoma   Diverticulosis 2012   found on colonoscopy   Erectile dysfunction    Family history of adverse reaction to anesthesia    difficult for son & sistor to wake    GERD (gastroesophageal reflux disease)    Hematochezia 05/24/2010   Hyperlipidemia    Hypertension    Osteoarthritis    knees/hands-Dr Sherlean Foot   Overweight(278.02)    Peripheral vascular disease (HCC) 03/11/2010   Moderate SFA stenosis; history of claudication   Pneumonia    Rosacea    Tobacco abuse, in remission    30-40 pack years discontinued in 1993   Vitamin B12 deficiency      Allergies as of 12/19/2021       Reactions   Cardizem [diltiazem Hcl] Hives, Rash        Medication List     TAKE these medications    acetaminophen 500 MG tablet Commonly known as: TYLENOL Take 500 mg by mouth every 8 (eight) hours as needed for moderate pain. Take with Tramadol   aspirin 81 MG tablet Take 81 mg by mouth daily.   chlorthalidone 25 MG tablet Commonly known as: HYGROTON TAKE 1/2 TABLET(12.5 MG) BY MOUTH DAILY What changed:  how much to take how to take this when to take this additional instructions   cimetidine 400 MG tablet Commonly known as: TAGAMET Take 400 mg by mouth 2 (two) times daily as needed (indigestion).   clopidogrel 75 MG tablet Commonly known as: PLAVIX TAKE 1 TABLET(75 MG) BY MOUTH DAILY WITH BREAKFAST What changed: See the new instructions.   Deferasirox 180  MG Tabs TAKE 4 TABLETS BY MOUTH DAILY What changed:  how much to take how to take this when to take this additional instructions   Fish Oil 1000 MG Caps Take 1,000 mg by mouth daily.   loperamide 2 MG tablet Commonly known as: IMODIUM A-D Take 2 mg by mouth 4 (four) times daily as needed for diarrhea or loose stools.   losartan 25 MG tablet Commonly known as: COZAAR TAKE ONE TABLET BY MOUTH EVERY DAY   metoprolol succinate 100 MG 24 hr tablet Commonly known as: TOPROL-XL TAKE 1 TABLET(100 MG) BY MOUTH DAILY WITH OR IMMEDIATELY FOLLOWING A MEAL What changed: See the new instructions.   nitroGLYCERIN 0.4 MG SL tablet Commonly known as: NITROSTAT Place 1 tablet (0.4 mg total) under the tongue every 5 (five) minutes  as needed for chest pain.   pravastatin 40 MG tablet Commonly known as: PRAVACHOL TAKE 1 TABLET(40 MG) BY MOUTH DAILY What changed: See the new instructions.   traMADol 50 MG tablet Commonly known as: ULTRAM TAKE 1 TABLET BY MOUTH EVERY 8 HOURS AS NEEDED What changed: when to take this   Vitamin B-12 500 MCG Subl Place 500 mcg under the tongue daily.   Vitamin D (Ergocalciferol) 1.25 MG (50000 UNIT) Caps capsule Commonly known as: DRISDOL TAKE 1 CAPSULE BY MOUTH 1 TIME A WEEK What changed: See the new instructions.        Discharge Instructions: [facility] Discharge instructions Lower Extremity Bypass Surgery  Please refer to the following instruction for your post-procedure care. Your surgeon or physician assistant will discuss any changes with you.  Activity  You are encouraged to walk as much as you can. You can slowly return to normal activities during the month after your surgery. Avoid strenuous activity and heavy lifting until your doctor tells you it's OK. Avoid activities such as vacuuming or swinging a golf club. Do not drive until your doctor give the OK and you are no longer taking prescription pain  medications. It is also normal to have difficulty with sleep habits, eating and bowel movement after surgery. These will go away with time.  Bathing/Showering  You may shower after you go home. Do not soak in a bathtub, hot tub, or swim until the incision heals completely.  Incision Care  Clean your incision with mild soap and water. Shower every day. Pat the area dry with a clean towel. You do not need a bandage unless otherwise instructed. Do not apply any ointments or creams to your incision. If you have open wounds you will be instructed how to care for them or a visiting nurse may be arranged for you. If you have staples or sutures along your incision they will be removed at your post-op appointment. You may have skin glue on your incision. Do not peel it off. It will come off on its own in about one week.  Keep Pravena wound vac on your groin incision until 12/24/2021.  Then you can remove and then wash the groin wound with soap and water daily and pat dry. (No tub bath-only shower)  Then put a dry gauze or washcloth in the groin to keep this area dry to help prevent wound infection.  Do this daily and as needed.  Do not use Vaseline or neosporin on your incisions.  Only use soap and water on your incisions and then protect and keep dry.  Leave ace wrap on for 48 hours then you can remove this.   Diet  Resume your normal diet. There are no special food restrictions following this procedure. A low fat/ low cholesterol diet is recommended for all patients with vascular disease. In order to heal from your surgery, it is CRITICAL to get adequate nutrition. Your body requires vitamins, minerals, and protein. Vegetables are the best source of vitamins and minerals. Vegetables also provide the perfect balance of protein. Processed food has little nutritional value, so try to avoid this.  Medications  Resume taking all your medications unless your doctor or Physician Assistant tells you not to. If  your incision is causing pain, you may take over-the-counter pain relievers such as acetaminophen (Tylenol). If you were prescribed a stronger pain medication, please aware these medication can cause nausea and constipation. Prevent nausea by taking the medication  with a snack or meal. Avoid constipation by drinking plenty of fluids and eating foods with high amount of fiber, such as fruits, vegetables, and grains. Take Colace 100 mg (an over-the-counter stool softener) twice a day as needed for constipation.  Do not take Tylenol if you are taking prescription pain medications.  Follow Up  Our office will schedule a follow up appointment 2-3 weeks following discharge.  Please call us immediately for any of the following conditions  Severe or worsening pain in your legs or feet while at rest or while walking Increase pain, redness, warmth, or drainage (pus) from your incision site(s) Fever of 101 degree or higher The swelling in your leg with the bypass suddenly worsens and becomes more painful than when you were in the hospital If you have been instructed to feel your graft pulse then you should do so every day. If you can no longer feel this pulse, call the office immediately. Not all patients are given this instruction.  Leg swelling is common after leg bypass surgery.  The swelling should improve over a few months following surgery. To improve the swelling, you may elevate your legs above the level of your heart while you are sitting or resting. Your surgeon or physician assistant may ask you to apply an ACE wrap or wear compression (TED) stockings to help to reduce swelling.  Reduce your risk of vascular disease  Stop smoking. If you would like help call [facility] at 1-800-QUIT-NOW (934 070 6833) or [place] at 513-194-1438.  Manage your cholesterol Maintain a desired weight Control your diabetes weight Control your diabetes Keep your blood pressure down  If you have any  questions, please call the office at [phone]   Prescriptions given:  Keflex 500mg  bid #14 no refill (sent to [facility])  Disposition: [facility] with [facility]  Patient's condition: is Good  Follow up: 1. [facility] PA on 01/03/2022   Doreatha Massed, PA-C [facility] [phone] 12/19/2021  7:38 AM  - For VQI Registry use ---   Post-op:  Wound infection: No  Graft infection: No  Transfusion: Yes    If yes, one unit given New Arrhythmia: No Ipsilateral amputation: No, [ ]  Minor, [ ]  BKA, [ ]  AKA Discharge patency: [x ] Primary, [ ]  Primary assisted, [ ]  Secondary, [ ]  Occluded Patency judged by: [ ]  Dopper only, [ ]  Palpable graft pulse, [x]  Palpable distal pulse, [ ]  ABI inc. > 0.15, [ ]  Duplex Discharge ABI: R not done, L  D/C Ambulatory Status: Ambulatory  Complications: Return to OR: Yes  Reason for return to OR: [ x] Bleeding, [ ]  Infection, [ ]  Thrombosis, [ ]  Revision  Discharge medications: Statin use:  yes ASA use:  yes Plavix use:  yes Beta blocker use: yes CCB use:  No ACEI use:   no ARB use:  yes Coumadin use: no

## 2021-12-18 NOTE — Social Work (Signed)
CSW acknowledges PT/OT orders.TOC will assist with disposition planning once the evaluations and recommendations have been completed.    TOC will continue to follow.

## 2021-12-18 NOTE — Evaluation (Signed)
Physical Therapy Evaluation Patient Details Name: Aaron Sexton MRN: [id number] DOB: [birth year] Today's Date: 12/18/2021  History of Present Illness  76 y.o. male presents to [facility] on 12/16/2021 with bleeding from incision site of LLE revascularization procedure (12/09/2021). Imaging demonstrates large L thigh hematoma. Pt underwent LLE exploration and washout with wound vac placement on 8/12. PMH includes anemia, diverticulosis, GERD, HTN, OA, PVD.  Clinical Impression  Pt presents to PT with deficits in strength, power, gait, balance, endurance. Pt requires some assistance to mobilize in bed initially, but is otherwise able to transfer and ambulate at a supervision level with support of a walker. Pt is encouraged to mobilize frequently during this admission in an effort to improve endurance. PT recommends discharge home with HHPT when medically ready.       Recommendations for follow up therapy are one component of a multi-disciplinary discharge planning process, led by the attending physician.  Recommendations may be updated based on patient status, additional functional criteria and insurance authorization.  Follow Up Recommendations Home health PT      Assistance Recommended at Discharge Intermittent Supervision/Assistance  Patient can return home with the following  A little help with walking and/or transfers;A little help with bathing/dressing/bathroom;Assistance with cooking/housework;Assist for transportation;Help with stairs or ramp for entrance    Equipment Recommendations None recommended by PT  Recommendations for Other Services       Functional Status Assessment Patient has had a recent decline in their functional status and demonstrates the ability to make significant improvements in function in a reasonable and predictable amount of time.     Precautions / Restrictions Precautions Precautions: Fall Restrictions Weight Bearing Restrictions: No      Mobility   Bed Mobility Overal bed mobility: Needs Assistance Bed Mobility: Supine to Sit, Sit to Supine     Supine to sit: Min assist Sit to supine: Supervision        Transfers Overall transfer level: Needs assistance Equipment used: Rolling walker (2 wheels) Transfers: Sit to/from Stand Sit to Stand: Supervision                Ambulation/Gait Ambulation/Gait assistance: Supervision Gait Distance (Feet): 70 Feet (additional trial of 35') Assistive device: Rolling walker (2 wheels) Gait Pattern/deviations: Step-through pattern Gait velocity: reduced Gait velocity interpretation: <1.8 ft/sec, indicate of risk for recurrent falls   General Gait Details: pt with steady step-through gait  Stairs            Wheelchair Mobility    Modified Rankin (Stroke Patients Only)       Balance Overall balance assessment: Needs assistance Sitting-balance support: No upper extremity supported, Feet supported Sitting balance-Leahy Scale: Good     Standing balance support: Single extremity supported, Bilateral upper extremity supported, Reliant on assistive device for balance Standing balance-Leahy Scale: Poor                               Pertinent Vitals/Pain Pain Assessment Pain Assessment: No/denies pain    Home Living Family/patient expects to be discharged to:: Private residence Living Arrangements: Spouse/significant other Available Help at Discharge: Family;Available 24 hours/day Type of Home: House Home Access: Stairs to enter Entrance Stairs-Rails: Can reach both Entrance Stairs-Number of Steps: 2   Home Layout: One level Home Equipment: Rollator (4 wheels);Hand held shower head;Grab bars - tub/shower;Shower seat;Rolling Environmental consultant (2 wheels)      Prior Function Prior Level of Function : Independent/Modified Independent;Driving  Mobility Comments: mod indep ambulating with rollator in house; uses [facility] for community mobility ADLs  Comments: Ind with ADL/iADL, continues to drive, and is hoping to get back to cutting the grass     Hand Dominance   Dominant Hand: Right    Extremity/Trunk Assessment   Upper Extremity Assessment Upper Extremity Assessment: Overall WFL for tasks assessed    Lower Extremity Assessment Lower Extremity Assessment: LLE deficits/detail LLE Deficits / Details: grossly 4-/5 based on observation, not formally assessed    Cervical / Trunk Assessment Cervical / Trunk Assessment: Normal  Communication   Communication: No difficulties  Cognition Arousal/Alertness: Awake/alert Behavior During Therapy: WFL for tasks assessed/performed Overall Cognitive Status: Within Functional Limits for tasks assessed                                          General Comments General comments (skin integrity, edema, etc.): VSS on RA, dressing clean and dry upon completion of mobility    Exercises     Assessment/Plan    PT Assessment Patient needs continued PT services  PT Problem List Decreased strength;Decreased activity tolerance;Decreased balance;Decreased mobility       PT Treatment Interventions DME instruction;Gait training;Stair training;Functional mobility training;Therapeutic activities;Therapeutic exercise;Balance training;Neuromuscular re-education;Patient/family education    PT Goals (Current goals can be found in the Care Plan section)  Acute Rehab PT Goals Patient Stated Goal: to go home PT Goal Formulation: With patient/family Time For Goal Achievement: 01/01/22 Potential to Achieve Goals: Good    Frequency Min 3X/week     Co-evaluation               AM-PAC PT "6 Clicks" Mobility  Outcome Measure Help needed turning from your back to your side while in a flat bed without using bedrails?: A Little Help needed moving from lying on your back to sitting on the side of a flat bed without using bedrails?: A Little Help needed moving to and from a bed to  a chair (including a wheelchair)?: A Little Help needed standing up from a chair using your arms (e.g., wheelchair or bedside chair)?: A Little Help needed to walk in hospital room?: A Little Help needed climbing 3-5 steps with a railing? : A Little 6 Click Score: 18    End of Session   Activity Tolerance: Patient tolerated treatment well Patient left: in bed;with call bell/phone within reach;with family/visitor present Nurse Communication: Mobility status PT Visit Diagnosis: Other abnormalities of gait and mobility (R26.89)    Time: 2595-6387 PT Time Calculation (min) (ACUTE ONLY): 30 min   Charges:   PT Evaluation $PT Eval Low Complexity: 1 Low          Arlyss Gandy, PT, DPT Acute Rehabilitation Office [phone]   Arlyss Gandy 12/18/2021, 4:21 PM

## 2021-12-18 NOTE — Progress Notes (Addendum)
  Progress Note    12/18/2021 7:10 AM 1 Day Post-Op  Subjective:  feeling much better today.  Says he did not get out of bed yesterday.  Wants to go home.   Tm 99.8 HR 70's-100's NSR 425'Z-563'O systolic 75% RA  Vitals:   12/17/21 2340 12/18/21 0410  BP: (!) 117/53 (!) 122/49  Pulse: 84 78  Resp: 20 20  Temp: 98.5 F (36.9 C) 99.8 F (37.7 C)  SpO2: 96% 96%    Physical Exam: Cardiac:  regular Lungs:  non labored Incisions:  ace wrap removed; left thigh incision clean with staples in tact.  No drainage.   Extremities:  easily palpable left DP pulse    CBC    Component Value Date/Time   WBC 13.5 (H) 12/18/2021 0151   RBC 2.74 (L) 12/18/2021 0151   HGB 8.4 (L) 12/18/2021 0151   HGB 9.4 (L) 10/12/2021 1623   HCT 26.9 (L) 12/18/2021 0151   HCT 26.4 (L) 10/12/2021 1623   PLT 383 12/18/2021 0151   PLT 327 10/12/2021 1623   MCV 98.2 12/18/2021 0151   MCV 110 (H) 10/12/2021 1623   MCH 30.7 12/18/2021 0151   MCHC 31.2 12/18/2021 0151   RDW 20.4 (H) 12/18/2021 0151   RDW 13.2 10/12/2021 1623   LYMPHSABS 1.9 12/16/2021 2345   MONOABS 1.1 (H) 12/16/2021 2345   EOSABS 0.4 12/16/2021 2345   BASOSABS 0.1 12/16/2021 2345    BMET    Component Value Date/Time   NA 135 12/18/2021 0151   NA 139 10/12/2021 1623   K 4.0 12/18/2021 0151   CL 104 12/18/2021 0151   CO2 22 12/18/2021 0151   GLUCOSE 108 (H) 12/18/2021 0151   BUN 17 12/18/2021 0151   BUN 17 10/12/2021 1623   CREATININE 1.15 12/18/2021 0151   CREATININE 0.91 09/15/2015 1508   CALCIUM 7.7 (L) 12/18/2021 0151   GFRNONAA >60 12/18/2021 0151   GFRAA >60 02/09/2020 1344    INR    Component Value Date/Time   INR 1.1 12/09/2021 0606     Intake/Output Summary (Last 24 hours) at 12/18/2021 0710 Last data filed at 12/18/2021 0600 Gross per 24 hour  Intake 200 ml  Output 2520 ml  Net -2320 ml     Assessment/Plan:  76 y.o. male is s/p:  Left leg exploration, washout, myriad more cell placement, VAC  dressing  1 Day Post-Op   -easily palpable left DP pulse -ace wrap removed and incision looks fine on left thigh with staples in tact.  No drainage.  Prevena left groin with good seal.  Discussed with pt when to take the prevena off after discharge.  Leg re-wrapped  Acute blood loss anemia-received one unit PRBC.  Hgb up to 8.4 from 7.9 -leukocytosis  -DVT prophylaxis:  sq heparin -mobilize out of bed today.   Possibly home tomorrow -has f/u appt in a couple of weeks, which he will keep.  He will need staple removal at that time.    Leontine Locket, PA-C [facility] [phone] 12/18/2021 7:10 AM   VASCULAR STAFF ADDENDUM: I have independently interviewed and examined the patient. I agree with the above.  OOB today. Possible home tomorrow barring no issues.  Cassandria Santee, MD [facility] Phone Number: [phone] 12/18/2021 10:05 AM

## 2021-12-18 NOTE — Discharge Instructions (Addendum)
[facility]  Discharge instructions  Lower Extremity Bypass Surgery  Please refer to the following instruction for your post-procedure care. Your surgeon or physician assistant will discuss any changes with you.  Activity  You are encouraged to walk as much as you can. You can slowly return to normal activities during the month after your surgery. Avoid strenuous activity and heavy lifting until your doctor tells you it's OK. Avoid activities such as vacuuming or swinging a golf club. Do not drive until your doctor give the OK and you are no longer taking prescription pain medications. It is also normal to have difficulty with sleep habits, eating and bowel movement after surgery. These will go away with time.  Bathing/Showering  Shower daily after you go home. Do not soak in a bathtub, hot tub, or swim until the incision heals completely.  Incision Care  Clean your incision with mild soap and water. Shower every day. Pat the area dry with a clean towel. You do not need a bandage unless otherwise instructed. Do not apply any ointments or creams to your incision. If you have open wounds you will be instructed how to care for them or a visiting nurse may be arranged for you. If you have staples or sutures along your incision they will be removed at your post-op appointment. You may have skin glue on your incision. Do not peel it off. It will come off on its own in about one week.  Keep Pravena wound vac on your groin incision until 12/24/2021.  Then you can remove and then wash the groin wound with soap and water daily and pat dry. (No tub bath-only shower)  Then put a dry gauze or washcloth in the groin to keep this area dry to help prevent wound infection.  Do this daily and as needed.  Do not use Vaseline or neosporin on your incisions.  Only use soap and water on your incisions and then protect and keep dry.  Leave ace wrap on for 48 hours then you can  remove this.   Diet  Resume your normal diet. There are no special food restrictions following this procedure. A low fat/ low cholesterol diet is recommended for all patients with vascular disease. In order to heal from your surgery, it is CRITICAL to get adequate nutrition. Your body requires vitamins, minerals, and protein. Vegetables are the best source of vitamins and minerals. Vegetables also provide the perfect balance of protein. Processed food has little nutritional value, so try to avoid this.  Medications  Resume taking all your medications unless your doctor or physician assistant tells you not to. If your incision is causing pain, you may take over-the-counter pain relievers such as acetaminophen (Tylenol). If you were prescribed a stronger pain medication, please aware these medication can cause nausea and constipation. Prevent nausea by taking the medication with a snack or meal. Avoid constipation by drinking plenty of fluids and eating foods with high amount of fiber, such as fruits, vegetables, and grains. Take Colace 100 mg (an over-the-counter stool softener) twice a day as needed for constipation.  Do not take Tylenol if you are taking prescription pain medications.  Follow Up  Our office will schedule a follow up appointment 2-3 weeks following discharge.  Please call us immediately for any of the following conditions  Severe or worsening pain in your legs or feet while at rest or while walking Increase pain, redness, warmth, or drainage (pus) from your incision  site(s) Fever of 101 degree or higher The swelling in your leg with the bypass suddenly worsens and becomes more painful than when you were in the hospital If you have been instructed to feel your graft pulse then you should do so every day. If you can no longer feel this pulse, call the office immediately. Not all patients are given this instruction.  Leg swelling is common after leg bypass surgery.  The  swelling should improve over a few months following surgery. To improve the swelling, you may elevate your legs above the level of your heart while you are sitting or resting. Your surgeon or physician assistant may ask you to apply an ACE wrap or wear compression (TED) stockings to help to reduce swelling.  Reduce your risk of vascular disease  Stop smoking. If you would like help call [facility] at 1-800-QUIT-NOW 650-191-5662) or [place] at 254-351-6987.  Manage your cholesterol Maintain a desired weight Control your diabetes weight Control your diabetes Keep your blood pressure down  If you have any questions, please call the office at [phone]

## 2021-12-19 ENCOUNTER — Encounter (HOSPITAL_COMMUNITY): Payer: Self-pay | Admitting: Vascular Surgery

## 2021-12-19 ENCOUNTER — Other Ambulatory Visit (HOSPITAL_COMMUNITY): Payer: Self-pay

## 2021-12-19 MED ORDER — CEPHALEXIN 500 MG PO CAPS
500.0000 mg | ORAL_CAPSULE | Freq: Two times a day (BID) | ORAL | 0 refills | Status: DC
  Filled 2021-12-19: qty 14, 7d supply, fill #0

## 2021-12-19 NOTE — Progress Notes (Addendum)
Progress Note    12/19/2021 6:47 AM 2 Days Post-Op  Subjective:  no complaints this morning.  Says his leg still feels better  Tm 99.2 HR 57'S-17'B NSR 939'Q systolic 30% RA  Vitals:   12/19/21 0008 12/19/21 0500  BP: (!) 120/51 (!) 120/49  Pulse: 83 81  Resp: 20 20  Temp: 98.9 F (37.2 C) 99.2 F (37.3 C)  SpO2: 97% 98%    Physical Exam: Cardiac:  regular Lungs:  non labored Incisions:  left groin with Prevena with good seal Extremities:  LLE wrap in place.  Brisk doppler signal left DP with motor and sensory in tact  CBC    Component Value Date/Time   WBC 13.5 (H) 12/18/2021 0151   RBC 2.74 (L) 12/18/2021 0151   HGB 8.4 (L) 12/18/2021 0151   HGB 9.4 (L) 10/12/2021 1623   HCT 26.9 (L) 12/18/2021 0151   HCT 26.4 (L) 10/12/2021 1623   PLT 383 12/18/2021 0151   PLT 327 10/12/2021 1623   MCV 98.2 12/18/2021 0151   MCV 110 (H) 10/12/2021 1623   MCH 30.7 12/18/2021 0151   MCHC 31.2 12/18/2021 0151   RDW 20.4 (H) 12/18/2021 0151   RDW 13.2 10/12/2021 1623   LYMPHSABS 1.9 12/16/2021 2345   MONOABS 1.1 (H) 12/16/2021 2345   EOSABS 0.4 12/16/2021 2345   BASOSABS 0.1 12/16/2021 2345    BMET    Component Value Date/Time   NA 135 12/18/2021 0151   NA 139 10/12/2021 1623   K 4.0 12/18/2021 0151   CL 104 12/18/2021 0151   CO2 22 12/18/2021 0151   GLUCOSE 108 (H) 12/18/2021 0151   BUN 17 12/18/2021 0151   BUN 17 10/12/2021 1623   CREATININE 1.15 12/18/2021 0151   CREATININE 0.91 09/15/2015 1508   CALCIUM 7.7 (L) 12/18/2021 0151   GFRNONAA >60 12/18/2021 0151   GFRAA >60 02/09/2020 1344    INR    Component Value Date/Time   INR 1.1 12/09/2021 0606     Intake/Output Summary (Last 24 hours) at 12/19/2021 0647 Last data filed at 12/19/2021 0646 Gross per 24 hour  Intake --  Output 2225 ml  Net -2225 ml     Assessment/Plan:  76 y.o. male is s/p:  Left femoral endarterectomy with femoral to AK popliteal bypass grafting with PTFE and angioplasty and  stenting of left CIA and EIA on 12/09/2021  and Take back for Left leg exploration, washout, myriad more cell placement, VAC dressing  on 12/17/2021 2 Days Post-Op   -pt doing well this am with brisk doppler flow left DP.   -prevena vac in place with good seal -PT recommending HHPT-face to face orders and TOC consult placed -DVT prophylaxis:  sq heparin -continue asa/plavix -probably home later today after HH needs arranged.  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    Leontine Locket, PA-C [facility] [phone] 12/19/2021 6:47 AM  I have seen and evaluated the patient. I agree with the PA note as documented above.  Reviewed events from the weekend.  Left groin incision closed with Prevena vac.  Left above-knee popliteal incision closed with staples and looks good.  Discussed he leave Prevena in place for 1 week or until it loses seal.  He has brisk triphasic Doppler signal in the left foot.  Discussed plan for discharge today on aspirin statin Plavix which he was previously taken.  I will give  him a week of Keflex for prophylaxis given reexploration of a prosthetic graft.  Discussed he keep his leg wrapped for 48 hours.  He has follow-up with [place] in 2 weeks.  Discussed he call with questions or concerns.  Hgb yesterday 7.9 --> 8.4.  Marty Heck, MD [facility] [place] Office: [phone]

## 2021-12-19 NOTE — Evaluation (Signed)
Occupational Therapy Evaluation Patient Details Name: Aaron Sexton MRN: [id number] DOB: March 16, 1946 Today's Date: 12/19/2021   History of Present Illness 76 y.o. male presents to [facility] hospital on 12/16/2021 with bleeding from incision site of LLE revascularization procedure (12/09/2021). Imaging demonstrates large L thigh hematoma. Pt underwent LLE exploration and washout with wound vac placement on 8/12. PMH includes anemia, diverticulosis, GERD, HTN, OA, PVD.   Clinical Impression   Prior to this admission, patient living with wife and independent in ADLs and driving. Patient with recent revascularization on 12/09/21 with wife providing assist with lower body ADLs and bathing. Currently, patient is min guard for transfers, and min A for ADLs. Patient with need for increased time and encouragement to mobilize, but remains very motivated to return home. OT recommending HHOT services at time of discharge. OT will continue to follow acutely.      Recommendations for follow up therapy are one component of a multi-disciplinary discharge planning process, led by the attending physician.  Recommendations may be updated based on patient status, additional functional criteria and insurance authorization.   Follow Up Recommendations  Home health OT    Assistance Recommended at Discharge Intermittent Supervision/Assistance  Patient can return home with the following A little help with walking and/or transfers;A little help with bathing/dressing/bathroom;Assist for transportation    Functional Status Assessment  Patient has had a recent decline in their functional status and demonstrates the ability to make significant improvements in function in a reasonable and predictable amount of time.  Equipment Recommendations  None recommended by OT    Recommendations for Other Services       Precautions / Restrictions Precautions Precautions: Fall Restrictions Weight Bearing Restrictions: No       Mobility Bed Mobility Overal bed mobility: Needs Assistance Bed Mobility: Supine to Sit     Supine to sit: Min guard     General bed mobility comments: increased time to complete bed mobility    Transfers Overall transfer level: Needs assistance Equipment used: Rolling walker (2 wheels) Transfers: Sit to/from Stand Sit to Stand: Min guard           General transfer comment: Min Guard A for safety      Balance Overall balance assessment: Needs assistance Sitting-balance support: No upper extremity supported, Feet supported Sitting balance-Leahy Scale: Good     Standing balance support: Single extremity supported, Bilateral upper extremity supported, Reliant on assistive device for balance Standing balance-Leahy Scale: Poor                             ADL either performed or assessed with clinical judgement   ADL Overall ADL's : Needs assistance/impaired Eating/Feeding: Sitting;Set up   Grooming: Set up;Sitting   Upper Body Bathing: Set up;Sitting   Lower Body Bathing: Minimal assistance;Moderate assistance;Sit to/from stand;Sitting/lateral leans   Upper Body Dressing : Supervision/safety;Sitting   Lower Body Dressing: Minimal assistance;Moderate assistance;Sitting/lateral leans;Sit to/from stand   Toilet Transfer: Nurse, adult (2 wheels)   Toileting- Clothing Manipulation and Hygiene: Minimal assistance;Sitting/lateral lean;Sit to/from stand       Functional mobility during ADLs: Minimal assistance;Cueing for safety;Cueing for sequencing General ADL Comments: Patient presenting with decreased activity tolerance     Vision Baseline Vision/History: 1 Wears glasses Ability to See in Adequate Light: 0 Adequate Patient Visual Report: No change from baseline       Perception     Praxis      Pertinent Vitals/Pain  Pain Assessment Pain Assessment: No/denies pain     Hand Dominance Right   Extremity/Trunk Assessment Upper  Extremity Assessment Upper Extremity Assessment: Overall WFL for tasks assessed   Lower Extremity Assessment Lower Extremity Assessment: Defer to PT evaluation   Cervical / Trunk Assessment Cervical / Trunk Assessment: Normal   Communication Communication Communication: No difficulties   Cognition Arousal/Alertness: Awake/alert Behavior During Therapy: WFL for tasks assessed/performed Overall Cognitive Status: Within Functional Limits for tasks assessed                                       General Comments  VSS on RA    Exercises     Shoulder Instructions      Home Living Family/patient expects to be discharged to:: Private residence Living Arrangements: Spouse/significant other Available Help at Discharge: Family;Available 24 hours/day Type of Home: House Home Access: Stairs to enter [employer] of Steps: 2 Entrance Stairs-Rails: Can reach both Home Layout: One level     Bathroom Shower/Tub: Tub/shower unit;Walk-in shower   Bathroom Toilet: Standard Bathroom Accessibility: Yes How Accessible: Accessible via walker Home Equipment: Rollator (4 wheels);Hand held shower head;Grab bars - tub/shower;Shower Counsellor (2 wheels)   Additional Comments: Sponge bathing currently      Prior Functioning/Environment Prior Level of Function : Independent/Modified Independent;Driving             Mobility Comments: mod indep ambulating with rollator in house; uses [facility] for community mobility ADLs Comments: Ind with ADL/iADL, continues to drive, and is hoping to get back to cutting the grass        OT Problem List: Decreased range of motion;Decreased activity tolerance;Impaired balance (sitting and/or standing);Pain      OT Treatment/Interventions: Self-care/ADL training;Therapeutic activities;Patient/family education;DME and/or AE instruction;Balance training    OT Goals(Current goals can be found in the care plan section) Acute  Rehab OT Goals Patient Stated Goal: to go home and get stronger OT Goal Formulation: With patient Time For Goal Achievement: 01/02/22 Potential to Achieve Goals: Good ADL Goals Pt Will Perform Lower Body Bathing: with modified independence;sit to/from stand;sitting/lateral leans Pt Will Perform Lower Body Dressing: with modified independence;sitting/lateral leans;with adaptive equipment;sit to/from stand Pt Will Transfer to Toilet: with modified independence;regular height toilet;ambulating Pt Will Perform Toileting - Clothing Manipulation and hygiene: with modified independence;sit to/from stand;sitting/lateral leans Additional ADL Goal #1: Patient will be able to verbalize 3 strategies for fall prevention and energy conservation for safe discharge home. Additional ADL Goal #2: Patient will be able to complete functional task in standing for 2-3 minutes prior to needing seated rest break to increase overall activity tolerance.  OT Frequency: Min 2X/week    Co-evaluation              AM-PAC OT "6 Clicks" Daily Activity     Outcome Measure Help from another person eating meals?: A Little Help from another person taking care of personal grooming?: A Little Help from another person toileting, which includes using toliet, bedpan, or urinal?: A Little Help from another person bathing (including washing, rinsing, drying)?: A Lot Help from another person to put on and taking off regular upper body clothing?: A Little Help from another person to put on and taking off regular lower body clothing?: A Lot 6 Click Score: 16   End of Session Equipment Utilized During Treatment: Rolling walker (2 wheels);Gait belt Nurse Communication: Mobility status  Activity  Tolerance: Patient tolerated treatment well Patient left: with call bell/phone within reach;in chair  OT Visit Diagnosis: Unsteadiness on feet (R26.81);Pain Pain - Right/Left: Left Pain - part of body: Leg                Time:  4098-1191 OT Time Calculation (min): 23 min Charges:  OT General Charges $OT Visit: 1 Visit OT Evaluation $OT Eval Moderate Complexity: 1 Mod OT Treatments $Self Care/Home Management : 8-22 mins  Pollyann Glen E. Delorese Sellin, OTR/L Acute Rehabilitation Services [phone]   Cherlyn Cushing 12/19/2021, 11:07 AM

## 2021-12-19 NOTE — Progress Notes (Signed)
Patient given discharge instructions and stated understanding. 

## 2021-12-19 NOTE — TOC Transition Note (Signed)
Transition of Care (TOC) - CM/SW Discharge Note [name] RN, BSN Transitions of Care Unit 4E- RN Case Manager See Treatment Team for direct phone #    Patient Details  Name: Aaron Sexton MRN: [id number] Date of Birth: [birth year]  Transition of Care [facility]) CM/SW Contact:  [name], RN Phone Number: 12/19/2021, 10:04 AM   Clinical Narrative:    Pt stable for transition home today, lives w/ wife.  Orders placed for HH.  Pt was active with [facility] for HHPT/OT. Referral made on last d/c 12/13/21 CM spoke w/ pt at bedside- confirmed [facility] services w/ [name]- pt voiced he would like to continue services w/ [name] for ongoing HHPT/OT needs.  No new DME needs noted. Pt will d/c home w/ Prevena wound VAC and leave in place for 1 week.  Wife to transport home.   Call made to [facility] with [facility] for resumption of [facility] services.   Final next level of care: [place] Barriers to Discharge: No Barriers Identified   Patient Goals and CMS Choice Patient states their goals for this hospitalization and ongoing recovery are:: return home CMS Medicare.gov Compare Post Acute Care list provided to:: Patient Choice offered to / list presented to : Patient  Discharge Placement                 Home w/ [facility]      Discharge Plan and Services   Discharge Planning Services: CM Consult Post Acute Care Choice: Home Health, Resumption of [name] Provider          DME Arranged: N/A DME Agency: NA       HH Arranged: PT, OT HH Agency: [place] Date [place]: 12/19/21 Time [place]: 1004 Representative spoke with at [place]: [place] Interventions     Readmission Risk Interventions    12/19/2021   10:04 AM 12/13/2021   10:33 AM  Readmission Risk Prevention Plan  Transportation Screening Complete Complete  PCP or Specialist Appt within 5-7 Days Complete Complete  Home Care Screening  Complete Complete  Medication Review (RN CM) Complete Complete

## 2021-12-20 ENCOUNTER — Other Ambulatory Visit: Payer: Self-pay | Admitting: *Deleted

## 2021-12-20 LAB — BPAM RBC
Blood Product Expiration Date: 202309062359
Blood Product Expiration Date: 202309062359
ISSUE DATE / TIME: 202308120615
ISSUE DATE / TIME: 202308120615
Unit Type and Rh: 7300
Unit Type and Rh: 7300

## 2021-12-20 LAB — TYPE AND SCREEN
ABO/RH(D): B POS
Antibody Screen: NEGATIVE
Unit division: 0
Unit division: 0

## 2021-12-20 NOTE — Patient Outreach (Signed)
  Care Coordination [facility] Note Transition Care Management Follow-up Telephone Call Date of discharge and from where: 12/19/21 [facility] How have you been since you were released from the hospital? Patient states he is feeling well and doing  better Any questions or concerns? No  Items Reviewed: Did the pt receive and understand the discharge instructions provided? Yes  Medications obtained and verified? Yes  Other? No  Any new allergies since your discharge? No  Dietary orders reviewed? Yes Do you have support at home? Yes   Home Care and Equipment/Supplies: Were home health services ordered? yes If so, what is the name of the agency? [facility]  Has the agency set up a time to come to the patient's home? yes Were any new equipment or medical supplies ordered?  No What is the name of the medical supply agency? N/A Were you able to get the supplies/equipment? not applicable Do you have any questions related to the use of the equipment or suppN/AAlies? No  Functional Questionnaire: (I = Independent and D = Dependent) ADLs: I  Bathing/Dressing- D  Meal Prep- D  Eating- I  Maintaining continence- I  Transferring/Ambulation- I  Managing Meds- I  Follow up appointments reviewed:  PCP Hospital f/u appt confirmed? No   Specialist Hospital f/u appt confirmed? Yes  Scheduled to see Vascular Vein Specialist  on 01/03/22 @ 1015. Are transportation arrangements needed? No  If their condition worsens, is the pt aware to call PCP or go to the Emergency Dept.? Yes Was the patient provided with contact information for the PCP's office or ED? Yes Was to pt encouraged to call back with questions or concerns? Yes  SDOH assessments and interventions completed:   Yes  Care Coordination Interventions Activated:  No   Care Coordination Interventions:   N/A     Encounter Outcome:  Pt. Visit Completed    Emelia Loron RN, BSN [place] [phone] [email or web address]

## 2021-12-21 NOTE — Telephone Encounter (Signed)
Appointment has been scheduled.

## 2021-12-22 ENCOUNTER — Telehealth: Payer: Self-pay

## 2021-12-22 NOTE — Telephone Encounter (Signed)
Said he still feels a little weak. Able to walk around. Eating well. Appreciated the call. Has an OV here next month.

## 2021-12-24 ENCOUNTER — Other Ambulatory Visit (HOSPITAL_COMMUNITY): Payer: Self-pay | Admitting: Hematology

## 2021-12-24 DIAGNOSIS — E559 Vitamin D deficiency, unspecified: Secondary | ICD-10-CM

## 2021-12-26 ENCOUNTER — Telehealth: Payer: Self-pay

## 2021-12-26 NOTE — Telephone Encounter (Signed)
Collier Bullock PT with [facility] called stating that the pt had a small area of redness near staples at both incision sites and some serosanguinous drainage.  Reviewed pt's chart, returned call for clarification, two identifiers used. She stated that the pt denied any fever, red streaks, edema, odor, purulent drainage, or pain. The pt's wife has been helping him change the dressing twice daily and using ABD pads and gauze. Today is his last day of the course of Keflex he was prescribed. Reassured and instructed to monitor for S&S of infection. Told to keep wound clean and dry. PT to visit at the end of the week and update. Confirmed understanding.

## 2021-12-27 ENCOUNTER — Other Ambulatory Visit: Payer: Self-pay | Admitting: Internal Medicine

## 2021-12-28 ENCOUNTER — Inpatient Hospital Stay: Payer: Medicare PPO

## 2021-12-28 ENCOUNTER — Inpatient Hospital Stay: Payer: Medicare PPO | Attending: Hematology | Admitting: Hematology

## 2021-12-28 ENCOUNTER — Encounter: Payer: Self-pay | Admitting: Hematology

## 2021-12-28 DIAGNOSIS — D539 Nutritional anemia, unspecified: Secondary | ICD-10-CM | POA: Diagnosis not present

## 2021-12-28 DIAGNOSIS — Z87891 Personal history of nicotine dependence: Secondary | ICD-10-CM | POA: Diagnosis not present

## 2021-12-28 DIAGNOSIS — Z801 Family history of malignant neoplasm of trachea, bronchus and lung: Secondary | ICD-10-CM | POA: Diagnosis not present

## 2021-12-28 DIAGNOSIS — Z808 Family history of malignant neoplasm of other organs or systems: Secondary | ICD-10-CM | POA: Insufficient documentation

## 2021-12-28 DIAGNOSIS — I2510 Atherosclerotic heart disease of native coronary artery without angina pectoris: Secondary | ICD-10-CM | POA: Insufficient documentation

## 2021-12-28 DIAGNOSIS — I10 Essential (primary) hypertension: Secondary | ICD-10-CM | POA: Diagnosis not present

## 2021-12-28 LAB — CBC WITH DIFFERENTIAL/PLATELET
Abs Immature Granulocytes: 0.05 10*3/uL (ref 0.00–0.07)
Basophils Absolute: 0.1 10*3/uL (ref 0.0–0.1)
Basophils Relative: 1 %
Eosinophils Absolute: 0.2 10*3/uL (ref 0.0–0.5)
Eosinophils Relative: 2 %
HCT: 29 % — ABNORMAL LOW (ref 39.0–52.0)
Hemoglobin: 9.3 g/dL — ABNORMAL LOW (ref 13.0–17.0)
Immature Granulocytes: 1 %
Lymphocytes Relative: 18 %
Lymphs Abs: 1.8 10*3/uL (ref 0.7–4.0)
MCH: 31.3 pg (ref 26.0–34.0)
MCHC: 32.1 g/dL (ref 30.0–36.0)
MCV: 97.6 fL (ref 80.0–100.0)
Monocytes Absolute: 0.8 10*3/uL (ref 0.1–1.0)
Monocytes Relative: 7 %
Neutro Abs: 7.5 10*3/uL (ref 1.7–7.7)
Neutrophils Relative %: 71 %
Platelets: 489 10*3/uL — ABNORMAL HIGH (ref 150–400)
RBC: 2.97 MIL/uL — ABNORMAL LOW (ref 4.22–5.81)
RDW: 21.7 % — ABNORMAL HIGH (ref 11.5–15.5)
WBC: 10.4 10*3/uL (ref 4.0–10.5)
nRBC: 0.5 % — ABNORMAL HIGH (ref 0.0–0.2)

## 2021-12-28 LAB — IRON AND TIBC
Iron: 132 ug/dL (ref 45–182)
Saturation Ratios: 51 % — ABNORMAL HIGH (ref 17.9–39.5)
TIBC: 257 ug/dL (ref 250–450)
UIBC: 125 ug/dL

## 2021-12-28 LAB — COMPREHENSIVE METABOLIC PANEL
ALT: 20 U/L (ref 0–44)
AST: 17 U/L (ref 15–41)
Albumin: 3.6 g/dL (ref 3.5–5.0)
Alkaline Phosphatase: 57 U/L (ref 38–126)
Anion gap: 8 (ref 5–15)
BUN: 28 mg/dL — ABNORMAL HIGH (ref 8–23)
CO2: 23 mmol/L (ref 22–32)
Calcium: 8.8 mg/dL — ABNORMAL LOW (ref 8.9–10.3)
Chloride: 104 mmol/L (ref 98–111)
Creatinine, Ser: 1.2 mg/dL (ref 0.61–1.24)
GFR, Estimated: >60 mL/min (ref >60)
Glucose, Bld: 100 mg/dL — ABNORMAL HIGH (ref 70–99)
Potassium: 3.5 mmol/L (ref 3.5–5.1)
Sodium: 135 mmol/L (ref 135–145)
Total Bilirubin: 0.7 mg/dL (ref 0.3–1.2)
Total Protein: 7.7 g/dL (ref 6.5–8.1)

## 2021-12-28 LAB — FERRITIN: Ferritin: 529 ng/mL — ABNORMAL HIGH (ref 24–336)

## 2021-12-28 NOTE — Progress Notes (Signed)
[place], [place] [address]   CLINIC:  Medical Oncology/Hematology  PCP:  Aaron Carbon, MD [place] / [place] [address]  [phone]  REASON FOR VISIT:  Follow-up for hereditary hemochromatosis  PRIOR THERAPY: none  CURRENT THERAPY: Deferasirox 720 mg daily  INTERVAL HISTORY:  [name], a 76 y.o. male, returns for follow-up of irritative hemochromatosis.  He was admitted twice in the month of August.  He was again admitted secondary to hematoma.  Apparently he had to have 9 units of PRBC during both admissions.  He reportedly missed 4 doses of deferasirox.  His energy levels are improving.  REVIEW OF SYSTEMS:  Review of Systems  Constitutional:  Negative for appetite change and fatigue.  Gastrointestinal:  Positive for diarrhea. Negative for nausea and vomiting.  All other systems reviewed and are negative.   PAST MEDICAL/SURGICAL HISTORY:  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   Cerebrovascular disease    Moderate ASVD without focal stenosis in 01/2007   Colon polyps 07/2010   single 7m polyp--tubular adenoma   Diverticulosis 2012   found on colonoscopy   Erectile dysfunction    Family history of adverse reaction to anesthesia    difficult for son & sistor to wake    GERD (gastroesophageal reflux disease)    Hematochezia 05/24/2010   Hyperlipidemia    Hypertension    Osteoarthritis    knees/hands-Dr LRonnie Sexton  Overweight(278.02)    Peripheral vascular disease ([facility] 03/11/2010   Moderate SFA stenosis; history of claudication   Pneumonia    Rosacea    Tobacco abuse, in remission    30-40 pack years discontinued in 1993   Vitamin B12 deficiency    Past Surgical History:  Procedure Laterality Date   APPENDECTOMY     APPLICATION OF WOUND VAC Left 12/17/2021   Procedure:  APPLICATION OF PREVENA WOUND VAC LEFT GROIN;  Surgeon: RBroadus John MD;  [facility]  Service: Vascular;  Laterality: Left;   COLONOSCOPY W/ POLYPECTOMY  2012   ENDARTERECTOMY FEMORAL Left 12/09/2021   Procedure: LEFT COMMON FEMORAL ENDARTERECTOMY WITH 1 CM X 6 CM [place]  Surgeon: CMarty Heck MD;  [place] [facility]  Service: Vascular;  Laterality: Left;   FEMORAL-POPLITEAL BYPASS GRAFT Left 12/09/2021   Procedure: LEFT FEMORAL- ABOVE KNEE POPLITEAL BYPASS WITH 6 mm x 80 cm PROPATEN GRAFT;  Surgeon: CMarty Heck MD;  [place] [facility]  Service: Vascular;  Laterality: Left;  INSERT ARTERIAL LINE   ILIAC VEIN ANGIOPLASTY / STENTING Right 08/09/2015   INSERTION OF ILIAC STENT  12/09/2021   Procedure: INSERTION OF LEFT COMMON AND EXTERNAL  ILIAC STENTS WITH ANGIOPLASTY;  Surgeon: CMarty Heck MD;  [facility]  Service: Vascular;;   LOWER EXTREMITY ANGIOGRAM Left 12/09/2021   Procedure: LEFT ILIAC AND LOWER EXTREMITY ANGIOGRAM;  Surgeon: CMarty Heck MD;  [place] [facility]  Service: Vascular;  Laterality: Left;   LOWER EXTREMITY ANGIOGRAPHY Bilateral 11/03/2021   Procedure: Lower Extremity Angiography;  Surgeon: BLorretta Harp MD;  [facility];  Service: Cardiovascular;  Laterality: Bilateral;  Limited Study   PERIPHERAL VASCULAR CATHETERIZATION Bilateral 06/21/2015   Procedure: Lower Extremity Angiography;  Surgeon: JLorretta Harp MD;  [facility];  Service: Cardiovascular;  Laterality: Bilateral;   PERIPHERAL VASCULAR CATHETERIZATION N/A 06/21/2015   Procedure: Abdominal Aortogram;  Surgeon: Aaron Harp, MD;  Location: [place] [facility];  Service: Cardiovascular;  Laterality: N/A;   PERIPHERAL VASCULAR CATHETERIZATION N/A 07/26/2015   Procedure: Lower Extremity Angiography;  Surgeon: Aaron Harp, MD;  Location: [place] [facility];  Service: Cardiovascular;  Laterality: N/A;   PERIPHERAL  VASCULAR CATHETERIZATION N/A 08/09/2015   Procedure: Lower Extremity Angiography;  Surgeon: Aaron Harp, MD;  Location: [place] [facility];  Service: Cardiovascular;  Laterality: N/A;   PERIPHERAL VASCULAR CATHETERIZATION  08/09/2015   Procedure: Peripheral Vascular Intervention;  Surgeon: Aaron Harp, MD;  Location: [place] [facility];  Service: Cardiovascular;;  rt ext. iliac atherectomy and stent   PERIPHERAL VASCULAR CATHETERIZATION N/A 09/20/2015   Procedure: Lower Extremity Angiography;  Surgeon: Aaron Harp, MD;  Location: [place] [facility];  Service: Cardiovascular;  Laterality: N/A;   PERIPHERAL VASCULAR CATHETERIZATION Right 09/20/2015   Procedure: Peripheral Vascular Intervention;  Surgeon: Aaron Harp, MD;  Location: [place] [facility];  Service: Cardiovascular;  Laterality: Right;  SFA   ROTATOR CUFF REPAIR Left 7/15   Dr Aaron Sexton   WOUND EXPLORATION Left 12/17/2021   Procedure: LEFT LEG WOUND EXPLORATION AND [place] WITH MYRIAD [place];  Surgeon: Aaron John, MD;  [facility];  Service: Vascular;  Laterality: Left;    SOCIAL HISTORY:  Social History   Socioeconomic History   Marital status: Married    Spouse name: Not on file   Number of children: 3   Years of education: Not on file   Highest education level: Not on file  Occupational History   Occupation: Managed supply chain--- retired 2013    Comment: [employer]  Tobacco Use   Smoking status: Former    Packs/day: 1.00    Years: 30.00    Total pack years: 30.00    Types: Cigarettes    Quit date: 05/16/1990    Years since quitting: 31.6   Smokeless tobacco: Never   Tobacco comments:    Quit in 1993  Vaping Use   Vaping Use: Never used  Substance and Sexual Activity   Alcohol use: No    Alcohol/week: 0.0 standard drinks of alcohol   Drug use: No   Sexual activity: Not on file  Other Topics Concern   Not on file  Social History Narrative   Has living will   Wife is  health care POA---alternate is son or daughter   Would accept resuscitation but no prolonged artificial ventilation.   No tube feeds if cognitively unaware   Social Determinants of Health   Financial Resource Strain: Low Risk  (03/10/2020)   Overall Financial Resource Strain (CARDIA)    Difficulty of Paying Living Expenses: Not hard at all  Food Insecurity: No Food Insecurity (03/10/2020)   Hunger Vital Sign    Worried About Running Out of Food in the Last Year: Never true    Ran Out of Food in the Last Year: Never true  Transportation Needs: No Transportation Needs (03/10/2020)   PRAPARE - Hydrologist (Medical): No    Lack of Transportation (Non-Medical): No  Physical Activity: Inactive (03/10/2020)   Exercise Vital Sign    Days of Exercise per Week: 0 days    Minutes of Exercise per Session: 0 min  Stress: No Stress Concern Present (03/10/2020)   [place]  Questionnaire    Feeling of Stress : Not at all  Social Connections: Moderately Integrated (03/10/2020)   Social Connection and Isolation Panel [NHANES]    Frequency of Communication with Friends and Family: More than three times a week    Frequency of Social Gatherings with Friends and Family: Never    Attends Religious Services: More than 4 times per year    Active Member of [employer] or Organizations: No    Attends Archivist Meetings: Never    Marital Status: Married  Human resources officer Violence: Not At Risk (03/10/2020)   Humiliation, Afraid, Rape, and Kick questionnaire    Fear of Current or Ex-Partner: No    Emotionally Abused: No    Physically Abused: No    Sexually Abused: No    FAMILY HISTORY:  Family History  Problem Relation Age of Onset   Hypertension Father        And siblings   Heart disease Father        And second-degree relatives   Transient ischemic attack Father    Lung cancer Mother    Diabetes Mother    Brain  cancer Sister    Diabetes Brother    Atrial fibrillation Brother    Heart disease Sister    Heart disease Sister    Thyroid disease Sister    Ulcers Son    Diabetes Son    Diabetes Son     CURRENT MEDICATIONS:  Current Outpatient Medications  Medication Sig Dispense Refill   acetaminophen (TYLENOL) 500 MG tablet Take 500 mg by mouth every 8 (eight) hours as needed for moderate pain. Take with Tramadol     aspirin 81 MG tablet Take 81 mg by mouth daily.     chlorthalidone (HYGROTON) 25 MG tablet TAKE 1/2 TABLET(12.5 MG) BY MOUTH DAILY (Patient taking differently: Take 12.5 mg by mouth daily.) 30 tablet 3   cimetidine (TAGAMET) 400 MG tablet Take 400 mg by mouth 2 (two) times daily as needed (indigestion).      clopidogrel (PLAVIX) 75 MG tablet TAKE 1 TABLET(75 MG) BY MOUTH DAILY WITH BREAKFAST (Patient taking differently: Take 75 mg by mouth daily.) 30 tablet 9   Cyanocobalamin (VITAMIN B-12) 500 MCG SUBL Place 500 mcg under the tongue daily.     Deferasirox 180 MG TABS TAKE 4 TABLETS BY MOUTH DAILY (Patient taking differently: Take 720 mg by mouth daily. Take on empty stomach) 120 tablet 2   loperamide (IMODIUM A-D) 2 MG tablet Take 2 mg by mouth 4 (four) times daily as needed for diarrhea or loose stools.     losartan (COZAAR) 25 MG tablet TAKE ONE TABLET BY MOUTH EVERY DAY (Patient taking differently: Take 25 mg by mouth daily.) 90 tablet 3   metoprolol succinate (TOPROL-XL) 100 MG 24 hr tablet TAKE 1 TABLET(100 MG) BY MOUTH DAILY WITH OR IMMEDIATELY FOLLOWING A MEAL (Patient taking differently: Take 100 mg by mouth daily.) 90 tablet 3   nitroGLYCERIN (NITROSTAT) 0.4 MG SL tablet Place 1 tablet (0.4 mg total) under the tongue every 5 (five) minutes as needed for chest pain. 25 tablet 1   Omega-3 Fatty Acids (FISH OIL) 1000 MG CAPS Take 1,000 mg by mouth daily.     pravastatin (PRAVACHOL) 40 MG tablet TAKE 1 TABLET(40 MG) BY MOUTH DAILY 90 tablet 3   traMADol (ULTRAM) 50 MG tablet TAKE 1  TABLET BY MOUTH EVERY 8 HOURS AS NEEDED (Patient taking differently: Take 50 mg by mouth every 8 (eight) hours.)  90 tablet 0   Vitamin D, Ergocalciferol, (DRISDOL) 1.25 MG (50000 UNIT) CAPS capsule TAKE 1 CAPSULE BY MOUTH 1 TIME A WEEK 12 capsule 2   Current Facility-Administered Medications  Medication Dose Route Frequency Provider Last Rate Last Admin   sodium chloride flush (NS) 0.9 % injection 3 mL  3 mL Intravenous Q12H Aaron Harp, MD        ALLERGIES:  Allergies  Allergen Reactions   Cardizem [Diltiazem Hcl] Hives and Rash    PHYSICAL EXAM:  Performance status (ECOG): 0 - Asymptomatic  Vitals:   12/28/21 1526  BP: (!) 128/59  Pulse: 83  Resp: 18  Temp: 97.8 F (36.6 C)  SpO2: 99%   Wt Readings from Last 3 Encounters:  12/28/21 195 lb 3.2 oz (88.5 kg)  12/13/21 209 lb 10.5 oz (95.1 kg)  11/29/21 202 lb (91.6 kg)   Physical Exam Vitals reviewed.  Constitutional:      Appearance: Normal appearance. He is obese.  Cardiovascular:     Rate and Rhythm: Normal rate and regular rhythm.     Pulses: Normal pulses.     Heart sounds: Normal heart sounds.  Pulmonary:     Effort: Pulmonary effort is normal.     Breath sounds: Normal breath sounds.  Neurological:     General: No focal deficit present.     Mental Status: He is alert and oriented to person, place, and time.  Psychiatric:        Mood and Affect: Mood normal.        Behavior: Behavior normal.     LABORATORY DATA:  I have reviewed the labs as listed.     Latest Ref Rng & Units 12/28/2021    3:03 PM 12/18/2021    1:51 AM 12/17/2021    3:30 AM  CBC  WBC 4.0 - 10.5 K/uL 10.4  13.5    Hemoglobin 13.0 - 17.0 g/dL 9.3  8.4  7.9   Hematocrit 39.0 - 52.0 % 29.0  26.9  23.7   Platelets 150 - 400 K/uL 489  383        Latest Ref Rng & Units 12/28/2021    3:03 PM 12/18/2021    1:51 AM 12/16/2021   11:45 PM  CMP  Glucose 70 - 99 mg/dL 100  108  108   BUN 8 - 23 mg/dL '28  17  26   ' Creatinine 0.61 - 1.24  mg/dL 1.20  1.15  1.22   Sodium 135 - 145 mmol/L 135  135  134   Potassium 3.5 - 5.1 mmol/L 3.5  4.0  3.4   Chloride 98 - 111 mmol/L 104  104  101   CO2 22 - 32 mmol/L '23  22  24   ' Calcium 8.9 - 10.3 mg/dL 8.8  7.7  8.4   Total Protein 6.5 - 8.1 g/dL 7.7     Total Bilirubin 0.3 - 1.2 mg/dL 0.7     Alkaline Phos 38 - 126 U/L 57     AST 15 - 41 U/L 17     ALT 0 - 44 U/L 20         Component Value Date/Time   RBC 2.97 (L) 12/28/2021 1503   MCV 97.6 12/28/2021 1503   MCV 110 (H) 10/12/2021 1623   MCH 31.3 12/28/2021 1503   MCHC 32.1 12/28/2021 1503   RDW 21.7 (H) 12/28/2021 1503   RDW 13.2 10/12/2021 1623   LYMPHSABS 1.8 12/28/2021 1503   MONOABS  0.8 12/28/2021 1503   EOSABS 0.2 12/28/2021 1503   BASOSABS 0.1 12/28/2021 1503    DIAGNOSTIC IMAGING:  I have independently reviewed the scans and discussed with the patient. CT ANGIO LOW EXTREM LEFT W &/OR WO CONTRAST  Result Date: 12/17/2021 CLINICAL DATA:  Lower extremity trauma with subsequent left lower extremity vascular surgery and stent placement, presenting with intermittent bleeding for several days. EXAM: CT ANGIOGRAPHY LOWER LEFT EXTREMITY TECHNIQUE: Multidetector CT imaging of the lower abdomen, pelvis and bilateral lower extremities was performed using the standard protocol during bolus administration of intravenous contrast. Multiplanar CT image reconstructions and MIPs were obtained to evaluate the vascular anatomy. CONTRAST:  156m OMNIPAQUE IOHEXOL 350 MG/ML SOLN COMPARISON:  None Available. FINDINGS: The visualized portions of the kidneys are normal in size and appearance. There is no evidence of hydronephrosis or renal obstruction. There is no evidence of bowel dilatation. Noninflamed diverticula are seen throughout the sigmoid colon. There is no evidence of free fluid. Abdominal aorta (infrarenal): Marked severity calcification and atherosclerosis, without evidence of aneurysmal dilatation, dissection or major vessel  occlusion. Bilateral common, external and internal iliac arteries: Marked severity calcification and atherosclerosis, without evidence of aneurysmal dilatation, dissection or major vessel occlusion. RIGHT LOWER EXTREMITY: Right common femoral artery: Marked severity calcification and atherosclerosis. The right common femoral bifurcates into the superficial femoral and deep (profundus) femoral arteries. Right Profundus femoris: Free of significant atherosclerosis. Right Superficial femoral: Marked severity calcification and atherosclerosis with occlusion of the proximal and mid portions and distal reconstitution of flow. Right Popliteal artery: Moderate severity calcification and atherosclerosis. The popliteal artery bifurcates into the anterior tibial and tibioperoneal trunk. Right tibioperoneal trunk: Marked severity calcification and atherosclerosis. Right anterior tibial artery: Proximal calcification and atherosclerosis, continuous with the dorsalis pedis artery. Right posterior tibial artery: Proximal calcification and atherosclerosis, continuous to the ankle. Right peroneal artery: Proximal calcification and atherosclerosis, continuous to the ankle. LEFT LOWER EXTREMITY: Left common femoral artery: Marked severity calcification and atherosclerosis. The left common femoral bifurcates into the superficial femoral and deep (profundus) femoral arteries. A patent vascular graft is seen extending from the left common femoral artery to the distal left superficial femoral artery (coronal reformatted images 36 through 52, CT series 9). Left Profundus femoris: Mild calcification and atherosclerosis, with a small area of proximal anterior extraluminal contrast noted (axial CT images 83 through 85, CT series 5). A small amount of Peri vascular fluid and soft tissue air is also seen within this region (axial CT images 82 through 90, CT series 5). Left Superficial femoral: Occluded with marked severity calcification and  atherosclerosis. Left Popliteal artery: Moderate severity calcification and atherosclerosis. The popliteal artery bifurcates into the anterior tibial and tibioperoneal trunk. Left tibioperoneal trunk: Marked severity calcification and atherosclerosis. Left anterior tibial artery: Marked severity calcification and atherosclerosis, continuous with the dorsalis pedis artery. Left posterior tibial artery: Marked severity calcification and atherosclerosis, continuous to the ankle. Left peroneal artery: Marked severity calcification and atherosclerosis, continuous to the ankle. The urinary bladder is unremarkable. The prostate gland is normal in size. There is a 2.4 cm x 1.8 cm fat containing umbilical hernia. An 8.3 cm x 6.3 cm x 13.8 cm ill-defined area of intramuscular and para muscular hemorrhagic and nonhemorrhagic fluid is seen along the posterior aspect of the distal left femur. This is seen surrounding the distal aspect of the previously noted vascular graft. A small amount of soft tissue air is also noted. No acute osseous abnormalities are identified. Review of the MIP  images confirms the above findings. IMPRESSION: 1. Findings consistent with a small area of active bleeding involving the proximal portion of the left profundus femoral artery, as described above. 2. Postoperative changes consistent with left common femoral/stool left superficial femoral graft placement. 3. Area of intramuscular and para muscular hemorrhagic and nonhemorrhagic fluid within the soft tissues posterior to the distal left femur and surrounding the previously noted common femoral/distal superficial femoral graft. This is likely, in part, postoperative in nature, without evidence to suggest active bleeding. Correlation with follow-up imaging is recommended to determine stability. 4. Occlusion of the proximal and mid portions of the right superficial femoral artery with distal reconstitution of flow. 5. Sigmoid diverticulosis. Aortic  Atherosclerosis (ICD10-I70.0). Electronically Signed   By: Virgina Norfolk M.D.   On: 12/17/2021 02:38   DG CHEST PORT 1 VIEW  Result Date: 12/09/2021 CLINICAL DATA:  Central line placement EXAM: PORTABLE CHEST 1 VIEW COMPARISON:  Radiograph 11/16/2017 FINDINGS: Cardiomediastinal silhouette is within normal limits in size. Left subclavian catheter tip overlies the brachiocephalic confluence, tip oriented towards the right lateral wall. No focal airspace consolidation. No pleural effusion. No pneumothorax. No acute osseous abnormality. IMPRESSION: Left subclavian catheter tip overlies the brachiocephalic confluence, tip oriented towards the right lateral wall. No focal airspace disease, pleural effusion, or pneumothorax. Electronically Signed   By: Maurine Simmering M.D.   On: 12/09/2021 16:11   HYBRID OR IMAGING ([facility] ONLY)  Result Date: 12/09/2021 There is no interpretation for this exam.  This order is for images obtained during a surgical procedure.  Please See "Surgeries" Tab for more information regarding the procedure.     ASSESSMENT:  1.  Hereditary hemochromatosis: -Patient seen for elevated ferritin levels. -EMR evaluation shows hemochromatosis testing on 01/27/2019 which showed heterozygosity for C282Y and H63D variants. -Given the elevated ferritin levels above 1000 and the results of genetic testing, this is compatible with hereditary hemochromatosis. -Patient does have arthritis of the small joints of the hands and arthritis of the back. -No history of CHF but has CAD.  No diabetes. -MRI of the liver on 12/11/2019 shows estimated liver iron concentration approximately 7.7 mg/g compatible with moderate liver iron.  Estimated fat fraction ranges from 7-12%, more pronounced in the right lobe compared to left hepatic lobe.  No focal hepatic lesions seen.  No morphological changes of significant liver disease. -Jadenu 540 mg daily started on 12/24/2019, dose increased to 720 mg daily on 02/09/2020.  -  MRI liver (09/21/2021): Diminished liver iron concentration less than 2 mg/g, slightly below "mild" designation (previously well into the "moderate" range).   2.  Macrocytic anemia: -Colonoscopy by Dr. Loletha Carrow on 02/17/2019 shows diverticulosis in the left colon, 12 mm polyp in the cecum, 4 to 6 mm polyp in the ascending colon and 5 mm polyp in the descending colon.  Pathology was consistent with tubular adenomas.   PLAN:  1.  Hereditary hemochromatosis: - He is taking Jadenu 720 mg in the mornings on an empty stomach. - MRI liver (09/21/2021): Diminished liver iron concentration less than 2 mg/g, slightly below "mild" designation (previously well into the "moderate" range).  - Ferritin and iron panel are not back yet.  I am thinking they will be elevated as he had received 9 units of PRBC.  I will continue at the same dose and see him back in 3 months for follow-up with repeat labs. - Addendum: Ferritin is 529, up from 170 on 09/21/2021.  This is high because of 9 units of  PRBC he received while in the hospital recently for both surgeries.  Hence I would not make any changes at this time.  We will reevaluate ferritin in 3 months.   2.  Macrocytic anemia: - Hemoglobin today has improved to 9.3 which is his baseline prior to bleeding issues.   3.  Vitamin D deficiency: - Continue vitamin D 50,000 units weekly.  Orders placed this encounter:  Orders Placed This Encounter  Procedures   CBC with Differential/Platelet   Comprehensive metabolic panel   Ferritin   Iron and TIBC      Derek Jack, MD [place] [phone]

## 2021-12-28 NOTE — Patient Instructions (Addendum)
[place] at [facility] Discharge Instructions  You were seen and examined today by Dr. Delton Coombes.  Dr. Delton Coombes discussed your most recent lab work and CT scan which revealed that your hemoglobin has improved. We will call if any changes.   Continue taking Jadenu as prescribed.   Follow-up as scheduled in 3 months.    Thank you for choosing [place] at [facility] to provide your oncology and hematology care.  To afford each patient quality time with our provider, please arrive at least 15 minutes before your scheduled appointment time.   If you have a lab appointment with the [place] [facility] please come in thru the Main Entrance and check in at the main information desk.  You need to re-schedule your appointment should you arrive 10 or more minutes late.  We strive to give you quality time with our providers, and arriving late affects you and other patients whose appointments are after yours.  Also, if you no show three or more times for appointments you may be dismissed from the clinic at the providers discretion.     Again, thank you for choosing [facility].  Our hope is that these requests will decrease the amount of time that you wait before being seen by our physicians.       _____________________________________________________________  Should you have questions after your visit to [facility], please contact our office at [phone] and follow the prompts.  Our office hours are 8:00 a.m. and 4:30 p.m. Monday - Friday.  Please note that voicemails left after 4:00 p.m. may not be returned until the following business day.  We are closed weekends and major holidays.  You do have access to a nurse 24-7, just call the main number to the clinic [phone] and do not press any options, hold on the line and a nurse will answer the phone.    For prescription refill requests, have your pharmacy contact our  office and allow 72 hours.

## 2021-12-30 ENCOUNTER — Other Ambulatory Visit (HOSPITAL_COMMUNITY): Payer: Self-pay

## 2022-01-02 ENCOUNTER — Other Ambulatory Visit: Payer: Self-pay | Admitting: Cardiovascular Disease

## 2022-01-03 ENCOUNTER — Ambulatory Visit (INDEPENDENT_AMBULATORY_CARE_PROVIDER_SITE_OTHER): Payer: Medicare PPO | Admitting: Physician Assistant

## 2022-01-03 VITALS — BP 132/68 | HR 76 | Temp 98.2°F | Ht 67.0 in | Wt 193.6 lb

## 2022-01-03 DIAGNOSIS — I70222 Atherosclerosis of native arteries of extremities with rest pain, left leg: Secondary | ICD-10-CM

## 2022-01-03 MED ORDER — SULFAMETHOXAZOLE-TRIMETHOPRIM 400-80 MG PO TABS: 1.0000 | ORAL_TABLET | Freq: Two times a day (BID) | ORAL | 0 refills | Status: DC

## 2022-01-03 NOTE — Progress Notes (Signed)
POST OPERATIVE OFFICE NOTE    CC:  F/u for surgery  HPI:  This is a 76 y.o. male who is s/p left iliofemoral endarterectomy with bovine patch angioplasty and left femoral to above-the-knee popliteal bypass with PTFE as well as left iliac stenting by Dr. Carlis Abbott on 12/09/2021.  He developed a hematoma in his thigh postoperatively and underwent evacuation of hematoma by Dr. Virl Cagey on 12/17/2021.  He is experiencing some drainage from his left groin incision.  He denies any fevers, chills, nausea/vomiting.  He states his left foot feels much better compared to prior to surgery.  He is on aspirin and Plavix daily.  Allergies  Allergen Reactions   Cardizem [Diltiazem Hcl] Hives and Rash    Current Outpatient Medications  Medication Sig Dispense Refill   acetaminophen (TYLENOL) 500 MG tablet Take 500 mg by mouth every 8 (eight) hours as needed for moderate pain. Take with Tramadol     aspirin 81 MG tablet Take 81 mg by mouth daily.     chlorthalidone (HYGROTON) 25 MG tablet TAKE 1/2 TABLET(12.5 MG) BY MOUTH DAILY (Patient taking differently: Take 12.5 mg by mouth daily.) 30 tablet 3   cimetidine (TAGAMET) 400 MG tablet Take 400 mg by mouth 2 (two) times daily as needed (indigestion).      clopidogrel (PLAVIX) 75 MG tablet TAKE 1 TABLET(75 MG) BY MOUTH DAILY WITH BREAKFAST 30 tablet 9   Cyanocobalamin (VITAMIN B-12) 500 MCG SUBL Place 500 mcg under the tongue daily.     Deferasirox 180 MG TABS TAKE 4 TABLETS BY MOUTH DAILY (Patient taking differently: Take 720 mg by mouth daily. Take on empty stomach) 120 tablet 2   loperamide (IMODIUM A-D) 2 MG tablet Take 2 mg by mouth 4 (four) times daily as needed for diarrhea or loose stools.     losartan (COZAAR) 25 MG tablet TAKE ONE TABLET BY MOUTH EVERY DAY (Patient taking differently: Take 25 mg by mouth daily.) 90 tablet 3   metoprolol succinate (TOPROL-XL) 100 MG 24 hr tablet TAKE 1 TABLET(100 MG) BY MOUTH DAILY WITH OR IMMEDIATELY FOLLOWING A MEAL (Patient  taking differently: Take 100 mg by mouth daily.) 90 tablet 3   nitroGLYCERIN (NITROSTAT) 0.4 MG SL tablet Place 1 tablet (0.4 mg total) under the tongue every 5 (five) minutes as needed for chest pain. 25 tablet 1   Omega-3 Fatty Acids (FISH OIL) 1000 MG CAPS Take 1,000 mg by mouth daily.     pravastatin (PRAVACHOL) 40 MG tablet TAKE 1 TABLET(40 MG) BY MOUTH DAILY 90 tablet 3   sulfamethoxazole-trimethoprim (BACTRIM) 400-80 MG tablet Take 1 tablet by mouth 2 (two) times daily. 20 tablet 0   traMADol (ULTRAM) 50 MG tablet TAKE 1 TABLET BY MOUTH EVERY 8 HOURS AS NEEDED (Patient taking differently: Take 50 mg by mouth every 8 (eight) hours.) 90 tablet 0   Vitamin D, Ergocalciferol, (DRISDOL) 1.25 MG (50000 UNIT) CAPS capsule TAKE 1 CAPSULE BY MOUTH 1 TIME A WEEK 12 capsule 2   Current Facility-Administered Medications  Medication Dose Route Frequency Provider Last Rate Last Admin   sodium chloride flush (NS) 0.9 % injection 3 mL  3 mL Intravenous Q12H Lorretta Harp, MD         ROS:  See HPI  Physical Exam:  Vitals:   01/03/22 1029  BP: 132/68  Pulse: 76  Temp: 98.2 F (36.8 C)  TempSrc: Temporal  Weight: 193 lb 9.6 oz (87.8 kg)  Height: '5\' 7"'$  (1.702 m)  Incision: Left thigh incision healing well; left groin incision with some superficial dehiscence some surrounding erythema and fibrinous exudate at the wound bed with serosanguineous drainage with manipulation Extremities: Palpable left DP pulse Neuro: A&O Abdomen:  soft      Assessment/Plan:  This is a 76 y.o. male who is s/p: Left iliofemoral endarterectomy with bovine patch angioplasty and femoral to above-the-knee popliteal bypass with PTFE as well as left iliac stenting with subsequent hematoma evacuation of the left thigh  -Left foot is well-perfused with a palpable DP pulse -Left thigh incision appears to be healing well; every other staple was removed in the office today -Left groin incision with some superficial  dehiscence and fibrinous exudate at the wound bed.  This was debrided as much as tolerable in the office.  Dr. Stanford Breed also evaluated the patient and agreed with the plan to start patient on a Bactrim regimen.  He will cleanse the groin with soap and water daily and pat to dry.  He will continue daily dressing changes twice daily and as needed.  He will follow-up in the office in 1 week for reevaluation.  He knows to call/return office sooner with any questions or concerns.  He is aware meticulous wound care is essential given that he has a bovine patch and bypass with PTFE underlying the wound.   Dagoberto Ligas, PA-C [facility] [phone]  Clinic MD:  Stanford Breed

## 2022-01-07 ENCOUNTER — Other Ambulatory Visit: Payer: Self-pay

## 2022-01-07 ENCOUNTER — Encounter (HOSPITAL_COMMUNITY): Payer: Self-pay

## 2022-01-07 ENCOUNTER — Inpatient Hospital Stay (HOSPITAL_COMMUNITY)
Admission: EM | Admit: 2022-01-07 | Discharge: 2022-01-12 | DRG: 903 | Disposition: A | Discharge status: Home Health | Payer: Medicare PPO | Attending: Vascular Surgery | Admitting: Vascular Surgery

## 2022-01-07 DIAGNOSIS — Y838 Other surgical procedures as the cause of abnormal reaction of the patient, or of later complication, without mention of misadventure at the time of the procedure: Secondary | ICD-10-CM | POA: Diagnosis present

## 2022-01-07 DIAGNOSIS — Z8249 Family history of ischemic heart disease and other diseases of the circulatory system: Secondary | ICD-10-CM

## 2022-01-07 DIAGNOSIS — Z888 Allergy status to other drugs, medicaments and biological substances status: Secondary | ICD-10-CM

## 2022-01-07 DIAGNOSIS — I2510 Atherosclerotic heart disease of native coronary artery without angina pectoris: Secondary | ICD-10-CM | POA: Diagnosis present

## 2022-01-07 DIAGNOSIS — M19041 Primary osteoarthritis, right hand: Secondary | ICD-10-CM | POA: Diagnosis present

## 2022-01-07 DIAGNOSIS — K219 Gastro-esophageal reflux disease without esophagitis: Secondary | ICD-10-CM | POA: Diagnosis present

## 2022-01-07 DIAGNOSIS — L719 Rosacea, unspecified: Secondary | ICD-10-CM | POA: Diagnosis present

## 2022-01-07 DIAGNOSIS — M1990 Unspecified osteoarthritis, unspecified site: Secondary | ICD-10-CM | POA: Diagnosis not present

## 2022-01-07 DIAGNOSIS — I679 Cerebrovascular disease, unspecified: Secondary | ICD-10-CM | POA: Diagnosis present

## 2022-01-07 DIAGNOSIS — E538 Deficiency of other specified B group vitamins: Secondary | ICD-10-CM | POA: Diagnosis present

## 2022-01-07 DIAGNOSIS — Z86010 Personal history of colonic polyps: Secondary | ICD-10-CM | POA: Diagnosis not present

## 2022-01-07 DIAGNOSIS — Z7902 Long term (current) use of antithrombotics/antiplatelets: Secondary | ICD-10-CM | POA: Diagnosis not present

## 2022-01-07 DIAGNOSIS — M19042 Primary osteoarthritis, left hand: Secondary | ICD-10-CM | POA: Diagnosis present

## 2022-01-07 DIAGNOSIS — T8189XA Other complications of procedures, not elsewhere classified, initial encounter: Secondary | ICD-10-CM | POA: Diagnosis not present

## 2022-01-07 DIAGNOSIS — S31109A Unspecified open wound of abdominal wall, unspecified quadrant without penetration into peritoneal cavity, initial encounter: Secondary | ICD-10-CM | POA: Diagnosis not present

## 2022-01-07 DIAGNOSIS — M170 Bilateral primary osteoarthritis of knee: Secondary | ICD-10-CM | POA: Diagnosis present

## 2022-01-07 DIAGNOSIS — Z79899 Other long term (current) drug therapy: Secondary | ICD-10-CM

## 2022-01-07 DIAGNOSIS — E785 Hyperlipidemia, unspecified: Secondary | ICD-10-CM | POA: Diagnosis present

## 2022-01-07 DIAGNOSIS — I739 Peripheral vascular disease, unspecified: Secondary | ICD-10-CM | POA: Diagnosis present

## 2022-01-07 DIAGNOSIS — I10 Essential (primary) hypertension: Secondary | ICD-10-CM | POA: Diagnosis present

## 2022-01-07 DIAGNOSIS — T8131XA Disruption of external operation (surgical) wound, not elsewhere classified, initial encounter: Principal | ICD-10-CM | POA: Diagnosis present

## 2022-01-07 DIAGNOSIS — N529 Male erectile dysfunction, unspecified: Secondary | ICD-10-CM | POA: Diagnosis present

## 2022-01-07 DIAGNOSIS — Z8701 Personal history of pneumonia (recurrent): Secondary | ICD-10-CM | POA: Diagnosis not present

## 2022-01-07 DIAGNOSIS — Z87891 Personal history of nicotine dependence: Secondary | ICD-10-CM

## 2022-01-07 DIAGNOSIS — L7682 Other postprocedural complications of skin and subcutaneous tissue: Secondary | ICD-10-CM | POA: Diagnosis not present

## 2022-01-07 DIAGNOSIS — D649 Anemia, unspecified: Secondary | ICD-10-CM | POA: Diagnosis not present

## 2022-01-07 DIAGNOSIS — Z7982 Long term (current) use of aspirin: Secondary | ICD-10-CM | POA: Diagnosis not present

## 2022-01-07 LAB — CBC WITH DIFFERENTIAL/PLATELET
Abs Immature Granulocytes: 0.03 10*3/uL (ref 0.00–0.07)
Basophils Absolute: 0.1 10*3/uL (ref 0.0–0.1)
Basophils Relative: 1 %
Eosinophils Absolute: 0.2 10*3/uL (ref 0.0–0.5)
Eosinophils Relative: 2 %
HCT: 26.1 % — ABNORMAL LOW (ref 39.0–52.0)
Hemoglobin: 8.4 g/dL — ABNORMAL LOW (ref 13.0–17.0)
Immature Granulocytes: 0 %
Lymphocytes Relative: 23 %
Lymphs Abs: 1.8 10*3/uL (ref 0.7–4.0)
MCH: 32.2 pg (ref 26.0–34.0)
MCHC: 32.2 g/dL (ref 30.0–36.0)
MCV: 100 fL (ref 80.0–100.0)
Monocytes Absolute: 0.8 10*3/uL (ref 0.1–1.0)
Monocytes Relative: 10 %
Neutro Abs: 5 10*3/uL (ref 1.7–7.7)
Neutrophils Relative %: 64 %
Platelets: 339 10*3/uL (ref 150–400)
RBC: 2.61 MIL/uL — ABNORMAL LOW (ref 4.22–5.81)
RDW: 24.2 % — ABNORMAL HIGH (ref 11.5–15.5)
WBC: 7.9 10*3/uL (ref 4.0–10.5)
nRBC: 0.5 % — ABNORMAL HIGH (ref 0.0–0.2)

## 2022-01-07 LAB — BASIC METABOLIC PANEL
Anion gap: 8 (ref 5–15)
BUN: 15 mg/dL (ref 8–23)
CO2: 23 mmol/L (ref 22–32)
Calcium: 8.7 mg/dL — ABNORMAL LOW (ref 8.9–10.3)
Chloride: 105 mmol/L (ref 98–111)
Creatinine, Ser: 1.22 mg/dL (ref 0.61–1.24)
GFR, Estimated: >60 mL/min (ref >60)
Glucose, Bld: 102 mg/dL — ABNORMAL HIGH (ref 70–99)
Potassium: 3.5 mmol/L (ref 3.5–5.1)
Sodium: 136 mmol/L (ref 135–145)

## 2022-01-07 MED ORDER — METOPROLOL TARTRATE 5 MG/5ML IV SOLN: 2.0000 mg | INTRAVENOUS | Status: DC | PRN

## 2022-01-07 MED ORDER — VANCOMYCIN HCL 2000 MG/400ML IV SOLN
2000.0000 mg | Freq: Once | INTRAVENOUS | Status: AC
  Administered 2022-01-07: 2000 mg via INTRAVENOUS
  Filled 2022-01-07: qty 400

## 2022-01-07 MED ORDER — GUAIFENESIN-DM 100-10 MG/5ML PO SYRP
15.0000 mL | ORAL_SOLUTION | ORAL | Status: DC | PRN
Start: 2022-01-07 — End: 2022-01-12

## 2022-01-07 MED ORDER — PANTOPRAZOLE SODIUM 40 MG PO TBEC
40.0000 mg | DELAYED_RELEASE_TABLET | Freq: Every day | ORAL | Status: DC
  Administered 2022-01-08 – 2022-01-12 (×5): 40 mg via ORAL
  Filled 2022-01-07 (×6): qty 1

## 2022-01-07 MED ORDER — PHENOL 1.4 % MT LIQD: 1.0000 | OROMUCOSAL | Status: DC | PRN

## 2022-01-07 MED ORDER — HYDRALAZINE HCL 20 MG/ML IJ SOLN: 5.0000 mg | INTRAMUSCULAR | Status: DC | PRN

## 2022-01-07 MED ORDER — METRONIDAZOLE 500 MG/100ML IV SOLN
500.0000 mg | Freq: Two times a day (BID) | INTRAVENOUS | Status: DC
Start: 2022-01-07 — End: 2022-01-11
  Administered 2022-01-07 – 2022-01-11 (×8): 500 mg via INTRAVENOUS
  Filled 2022-01-07 (×8): qty 100

## 2022-01-07 MED ORDER — LABETALOL HCL 5 MG/ML IV SOLN: 10.0000 mg | INTRAVENOUS | Status: DC | PRN

## 2022-01-07 MED ORDER — TRAMADOL HCL 50 MG PO TABS: 50.0000 mg | ORAL_TABLET | Freq: Four times a day (QID) | ORAL | Status: DC | PRN

## 2022-01-07 MED ORDER — CEFEPIME HCL 2 G IV SOLR
2.0000 g | Freq: Two times a day (BID) | INTRAVENOUS | Status: DC
  Administered 2022-01-07 – 2022-01-08 (×3): 2 g via INTRAVENOUS
  Filled 2022-01-07 (×3): qty 12.5

## 2022-01-07 MED ORDER — METOPROLOL SUCCINATE ER 100 MG PO TB24
100.0000 mg | ORAL_TABLET | Freq: Every day | ORAL | Status: DC
  Administered 2022-01-08 – 2022-01-12 (×5): 100 mg via ORAL
  Filled 2022-01-07 (×7): qty 1

## 2022-01-07 MED ORDER — ONDANSETRON HCL 4 MG/2ML IJ SOLN: 4.0000 mg | Freq: Four times a day (QID) | INTRAMUSCULAR | Status: DC | PRN

## 2022-01-07 MED ORDER — MAGNESIUM SULFATE 2 GM/50ML IV SOLN: 2.0000 g | Freq: Every day | INTRAVENOUS | Status: DC | PRN

## 2022-01-07 MED ORDER — CLOPIDOGREL BISULFATE 75 MG PO TABS
75.0000 mg | ORAL_TABLET | Freq: Every day | ORAL | Status: DC
  Administered 2022-01-08 – 2022-01-12 (×5): 75 mg via ORAL
  Filled 2022-01-07 (×6): qty 1

## 2022-01-07 MED ORDER — ALUM & MAG HYDROXIDE-SIMETH 200-200-20 MG/5ML PO SUSP: 15.0000 mL | ORAL | Status: DC | PRN

## 2022-01-07 MED ORDER — DOCUSATE SODIUM 100 MG PO CAPS
100.0000 mg | ORAL_CAPSULE | Freq: Every day | ORAL | Status: DC
  Administered 2022-01-08 – 2022-01-12 (×4): 100 mg via ORAL
  Filled 2022-01-07 (×5): qty 1

## 2022-01-07 MED ORDER — ENOXAPARIN SODIUM 40 MG/0.4ML IJ SOSY
40.0000 mg | PREFILLED_SYRINGE | INTRAMUSCULAR | Status: DC
  Administered 2022-01-09 – 2022-01-12 (×4): 40 mg via SUBCUTANEOUS
  Filled 2022-01-07 (×4): qty 0.4

## 2022-01-07 MED ORDER — ACETAMINOPHEN 650 MG RE SUPP: 325.0000 mg | RECTAL | Status: DC | PRN

## 2022-01-07 MED ORDER — ASPIRIN 81 MG PO TBEC
81.0000 mg | DELAYED_RELEASE_TABLET | Freq: Every day | ORAL | Status: DC
  Administered 2022-01-08 – 2022-01-12 (×5): 81 mg via ORAL
  Filled 2022-01-07 (×6): qty 1

## 2022-01-07 MED ORDER — VANCOMYCIN HCL 1500 MG/300ML IV SOLN
1500.0000 mg | INTRAVENOUS | Status: DC
  Administered 2022-01-08 – 2022-01-10 (×3): 1500 mg via INTRAVENOUS
  Filled 2022-01-07 (×3): qty 300

## 2022-01-07 MED ORDER — POTASSIUM CHLORIDE CRYS ER 20 MEQ PO TBCR: 20.0000 meq | EXTENDED_RELEASE_TABLET | Freq: Every day | ORAL | Status: DC | PRN

## 2022-01-07 MED ORDER — CEFAZOLIN SODIUM-DEXTROSE 2-4 GM/100ML-% IV SOLN
2.0000 g | Freq: Three times a day (TID) | INTRAVENOUS | Status: DC
  Administered 2022-01-07: 2 g via INTRAVENOUS
  Filled 2022-01-07: qty 100

## 2022-01-07 MED ORDER — ACETAMINOPHEN 325 MG PO TABS: 325.0000 mg | ORAL_TABLET | ORAL | Status: DC | PRN

## 2022-01-07 MED ORDER — LOSARTAN POTASSIUM 25 MG PO TABS
25.0000 mg | ORAL_TABLET | Freq: Every day | ORAL | Status: DC
  Administered 2022-01-08 – 2022-01-12 (×5): 25 mg via ORAL
  Filled 2022-01-07 (×6): qty 1

## 2022-01-07 MED ORDER — DIPHENHYDRAMINE HCL 50 MG/ML IJ SOLN
25.0000 mg | Freq: Once | INTRAMUSCULAR | Status: AC
  Administered 2022-01-07: 25 mg via INTRAVENOUS
  Filled 2022-01-07: qty 1

## 2022-01-07 NOTE — Progress Notes (Addendum)
At 1710 patient began having a reaction to Vancomycin. Patient face and chest including legs began to turn red. Patient said he felt itchy. Patient denies any shortness of breath. On call physician paged and ordered Benadryl IV. Benadryl IV given. Patient comfortable and in bed. MD on call stated once Benadryl IV given and symptoms subsided that nursing staff could restart Vancomycin at 100 mL/hr.

## 2022-01-07 NOTE — Progress Notes (Signed)
Pt arrived to unit from 6N27 VSS, A/O x 4,  CCMD called ,CHG given, pt oriented to unit,Will continue to monitor.   Albin Felling Corderro Koloski, RN    01/07/22 1800  Vitals  Temp 98.6 F (37 C)  Temp Source Oral  BP (!) 113/53  MAP (mmHg) 72  BP Location Right Arm  BP Method Automatic  Patient Position (if appropriate) Sitting  Pulse Rate 80  Pulse Rate Source Monitor  ECG Heart Rate 78  Resp 20  Level of Consciousness  Level of Consciousness Alert  Oxygen Therapy  SpO2 97 %  O2 Device Room Air  O2 Flow Rate (L/min) 0 L/min  Pain Assessment  Pain Scale 0-10  Pain Score 0  MEWS Score  MEWS Temp 0  MEWS Systolic 0  MEWS Pulse 0  MEWS RR 0  MEWS LOC 0  MEWS Score 0  MEWS Score Color Nyoka Cowden

## 2022-01-07 NOTE — H&P (Signed)
H&P     History of Present Illness: This is a 76 y.o. male with multiple medical issues including hyperlipidemia, coronary artery disease, PVD that presents for evaluation of ongoing drainage from his left groin.  Patient had a left common femoral endarterectomy including profundoplasty with left common femoral to above-knee popliteal bypass and left iliac stenting on 12/09/2021.  He then had to go back for a washout with Dr. Unk Lightning on 12/17/2021.  He has had persistent serous drainage from the left groin.  The wife feels this has increased.  No purulent drainage.  No fevers or chills.  Has been on Bactrim since being seen in the office earlier this week by the PA.  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   Cerebrovascular disease    Moderate ASVD without focal stenosis in 01/2007   Colon polyps 07/2010   single 49m polyp--tubular adenoma   Diverticulosis 2012   found on colonoscopy   Erectile dysfunction    Family history of adverse reaction to anesthesia    difficult for son & sistor to wake    GERD (gastroesophageal reflux disease)    Hematochezia 05/24/2010   Hyperlipidemia    Hypertension    Osteoarthritis    knees/hands-Dr LRonnie Derby  Overweight(278.02)    Peripheral vascular disease ([facility] 03/11/2010   Moderate SFA stenosis; history of claudication   Pneumonia    Rosacea    Tobacco abuse, in remission    30-40 pack years discontinued in 1993   Vitamin B12 deficiency     Past Surgical History:  Procedure Laterality Date   APPENDECTOMY     APPLICATION OF WOUND VAC Left 12/17/2021   Procedure: APPLICATION OF PREVENA WOUND VAC LEFT GROIN;  Surgeon: RBroadus John MD;  [facility] [facility]  Service: Vascular;  Laterality: Left;   COLONOSCOPY W/ POLYPECTOMY  2012   ENDARTERECTOMY FEMORAL Left 12/09/2021   Procedure: LEFT COMMON FEMORAL ENDARTERECTOMY WITH 1 CM X 6 CM  XSherman  Surgeon: CMarty Heck MD;  [facility]  Service: Vascular;  Laterality: Left;   FEMORAL-POPLITEAL BYPASS GRAFT Left 12/09/2021   Procedure: LEFT FEMORAL- ABOVE KNEE POPLITEAL BYPASS WITH 6 mm x 80 cm PROPATEN GRAFT;  Surgeon: CMarty Heck MD;  [facility]  Service: Vascular;  Laterality: Left;  INSERT ARTERIAL LINE   ILIAC VEIN ANGIOPLASTY / STENTING Right 08/09/2015   INSERTION OF ILIAC STENT  12/09/2021   Procedure: INSERTION OF LEFT COMMON AND EXTERNAL  ILIAC STENTS WITH ANGIOPLASTY;  Surgeon: CMarty Heck MD;  [facility]  Service: Vascular;;   LOWER EXTREMITY ANGIOGRAM Left 12/09/2021   Procedure: LEFT ILIAC AND LOWER EXTREMITY ANGIOGRAM;  Surgeon: CMarty Heck MD;  [facility]  Service: Vascular;  Laterality: Left;   LOWER EXTREMITY ANGIOGRAPHY Bilateral 11/03/2021   Procedure: Lower Extremity Angiography;  Surgeon: BLorretta Harp MD;  [facility];  Service: Cardiovascular;  Laterality: Bilateral;  Limited Study   PERIPHERAL VASCULAR CATHETERIZATION Bilateral 06/21/2015   Procedure: Lower Extremity Angiography;  Surgeon: JLorretta Harp MD;  [facility];  Service: Cardiovascular;  Laterality: Bilateral;   PERIPHERAL VASCULAR CATHETERIZATION N/A 06/21/2015   Procedure: Abdominal Aortogram;  Surgeon: JLorretta Harp MD;  [facility];  Service: Cardiovascular;  Laterality: N/A;   PERIPHERAL VASCULAR CATHETERIZATION N/A 07/26/2015  Procedure: Lower Extremity Angiography;  Surgeon: Lorretta Harp, MD;  Location: [place];  Service: Cardiovascular;  Laterality: N/A;   PERIPHERAL VASCULAR CATHETERIZATION N/A 08/09/2015   Procedure: Lower Extremity Angiography;  Surgeon: Lorretta Harp, MD;  Location: [facility];  Service: Cardiovascular;  Laterality: N/A;   PERIPHERAL VASCULAR CATHETERIZATION  08/09/2015   Procedure: Peripheral Vascular  Intervention;  Surgeon: Lorretta Harp, MD;  Location: [facility];  Service: Cardiovascular;;  rt ext. iliac atherectomy and stent   PERIPHERAL VASCULAR CATHETERIZATION N/A 09/20/2015   Procedure: Lower Extremity Angiography;  Surgeon: Lorretta Harp, MD;  Location: [facility];  Service: Cardiovascular;  Laterality: N/A;   PERIPHERAL VASCULAR CATHETERIZATION Right 09/20/2015   Procedure: Peripheral Vascular Intervention;  Surgeon: Lorretta Harp, MD;  Location: [facility];  Service: Cardiovascular;  Laterality: Right;  SFA   ROTATOR CUFF REPAIR Left 7/15   Dr Ronnie Derby   WOUND EXPLORATION Left 12/17/2021   Procedure: LEFT LEG WOUND EXPLORATION AND [place] OUT WITH MYRIAD MORCELLS;  Surgeon: Broadus John, MD;  Location: [facility];  Service: Vascular;  Laterality: Left;    Allergies  Allergen Reactions   Cardizem [Diltiazem Hcl] Hives and Rash    Prior to Admission medications   Medication Sig Start Date End Date Taking? Authorizing Provider  acetaminophen (TYLENOL) 500 MG tablet Take 500 mg by mouth every 8 (eight) hours as needed for moderate pain. Take with Tramadol    [provider]  aspirin 81 MG tablet Take 81 mg by mouth daily.    [provider]  chlorthalidone (HYGROTON) 25 MG tablet TAKE 1/2 TABLET(12.5 MG) BY MOUTH DAILY Patient taking differently: Take 12.5 mg by mouth daily. 10/25/21   Lorretta Harp, MD  cimetidine (TAGAMET) 400 MG tablet Take 400 mg by mouth 2 (two) times daily as needed (indigestion).     [provider]  clopidogrel (PLAVIX) 75 MG tablet TAKE 1 TABLET(75 MG) BY MOUTH DAILY WITH BREAKFAST 01/02/22   Lorretta Harp, MD  Cyanocobalamin (VITAMIN B-12) 500 MCG SUBL Place 500 mcg under the tongue daily.    [provider]  Deferasirox 180 MG TABS TAKE 4 TABLETS BY MOUTH DAILY Patient taking differently: Take 720 mg by mouth daily. Take on empty stomach 11/02/21   Derek Jack, MD  loperamide  (IMODIUM A-D) 2 MG tablet Take 2 mg by mouth 4 (four) times daily as needed for diarrhea or loose stools.    [provider]  losartan (COZAAR) 25 MG tablet TAKE ONE TABLET BY MOUTH EVERY DAY Patient taking differently: Take 25 mg by mouth daily. 11/29/20   Venia Carbon, MD  metoprolol succinate (TOPROL-XL) 100 MG 24 hr tablet TAKE 1 TABLET(100 MG) BY MOUTH DAILY WITH OR IMMEDIATELY FOLLOWING A MEAL Patient taking differently: Take 100 mg by mouth daily. 09/26/21   Arnoldo Lenis, MD  nitroGLYCERIN (NITROSTAT) 0.4 MG SL tablet Place 1 tablet (0.4 mg total) under the tongue every 5 (five) minutes as needed for chest pain. 10/12/21   Lorretta Harp, MD  Omega-3 Fatty Acids (FISH OIL) 1000 MG CAPS Take 1,000 mg by mouth daily.    [provider]  pravastatin (PRAVACHOL) 40 MG tablet TAKE 1 TABLET(40 MG) BY MOUTH DAILY 12/28/21   Venia Carbon, MD  sulfamethoxazole-trimethoprim (BACTRIM) 400-80 MG tablet Take 1 tablet by mouth 2 (two) times daily. 01/03/22   Dagoberto Ligas, PA-C  traMADol (ULTRAM) 50 MG  tablet TAKE 1 TABLET BY MOUTH EVERY 8 HOURS AS NEEDED Patient taking differently: Take 50 mg by mouth every 8 (eight) hours. 12/05/21   Venia Carbon, MD  Vitamin D, Ergocalciferol, (DRISDOL) 1.25 MG (50000 UNIT) CAPS capsule TAKE 1 CAPSULE BY MOUTH 1 TIME A WEEK 12/26/21   Derek Jack, MD    Social History   Socioeconomic History   Marital status: Married    Spouse name: Not on file   Number of children: 3   Years of education: Not on file   Highest education level: Not on file  Occupational History   Occupation: Managed supply chain--- retired 2013    Comment: [employer]  Tobacco Use   Smoking status: Former    Packs/day: 1.00    Years: 30.00    Total pack years: 30.00    Types: Cigarettes    Quit date: 05/16/1990    Years since quitting: 31.6   Smokeless tobacco: Never   Tobacco comments:    Quit in 1993  Vaping Use   Vaping  Use: Never used  Substance and Sexual Activity   Alcohol use: No    Alcohol/week: 0.0 standard drinks of alcohol   Drug use: No   Sexual activity: Not on file  Other Topics Concern   Not on file  Social History Narrative   Has living will   Wife is health care POA---alternate is son or daughter   Would accept resuscitation but no prolonged artificial ventilation.   No tube feeds if cognitively unaware   Social Determinants of Health   Financial Resource Strain: Low Risk  (03/10/2020)   Overall Financial Resource Strain (CARDIA)    Difficulty of Paying Living Expenses: Not hard at all  Food Insecurity: No Food Insecurity (03/10/2020)   Hunger Vital Sign    Worried About Running Out of Food in the Last Year: Never true    Ran Out of Food in the Last Year: Never true  Transportation Needs: No Transportation Needs (03/10/2020)   PRAPARE - Hydrologist (Medical): No    Lack of Transportation (Non-Medical): No  Physical Activity: Inactive (03/10/2020)   Exercise Vital Sign    Days of Exercise per Week: 0 days    Minutes of Exercise per Session: 0 min  Stress: No Stress Concern Present (03/10/2020)   [place]    Feeling of Stress : Not at all  Social Connections: Moderately Integrated (03/10/2020)   Social Connection and Isolation Panel [NHANES]    Frequency of Communication with Friends and Family: More than three times a week    Frequency of Social Gatherings with Friends and Family: Never    Attends Religious Services: More than 4 times per year    Active Member of [employer] or Organizations: No    Attends Archivist Meetings: Never    Marital Status: Married  Human resources officer Violence: Not At Risk (03/10/2020)   Humiliation, Afraid, Rape, and Kick questionnaire    Fear of Current or Ex-Partner: No    Emotionally Abused: No    Physically Abused: No    Sexually Abused: No      Family History  Problem Relation Age of Onset   Hypertension Father        And siblings   Heart disease Father        And second-degree relatives   Transient ischemic attack Father    Lung cancer Mother  Diabetes Mother    Brain cancer Sister    Diabetes Brother    Atrial fibrillation Brother    Heart disease Sister    Heart disease Sister    Thyroid disease Sister    Ulcers Son    Diabetes Son    Diabetes Son     ROS: _0  Positive   _1  Negative   _2  All sytems reviewed and are negative  Cardiovascular: _3  chest pain/pressure _4  palpitations _5  SOB lying flat _6  DOE _7  pain in legs while walking _8  pain in legs at rest _9  pain in legs at night _10  non-healing ulcers _11  hx of DVT _12  swelling in legs  Pulmonary: _13  productive cough _14  asthma/wheezing _15  home O2  Neurologic: _16  weakness in _17  arms _18  legs _19  numbness in _20  arms _21  legs _22  hx of CVA _23  mini stroke _24 difficulty speaking or slurred speech _25  temporary loss of vision in one eye _26  dizziness  Hematologic: _27  hx of cancer _28  bleeding problems _29  problems with blood clotting easily  Endocrine:   _30  diabetes _31  thyroid disease  GI _32  vomiting blood _33  blood in stool  GU: _34  CKD/renal failure _35  HD--_36  M/W/F or _37  T/T/S _38  burning with urination _39  blood in urine  Psychiatric: _40  anxiety _41  depression  Musculoskeletal: _42  arthritis _43  joint pain  Integumentary: _44  rashes _45  ulcers  Constitutional: _46  fever _47  chills   Physical Examination  Vitals:   01/07/22 1345  BP: (!) 125/53  Pulse: 94  Resp: 16  Temp: 97.7 F (36.5 C)  SpO2: 99%   There is no height or weight on file to calculate BMI.  General:  NAD Gait: Not observed HENT: WNL, normocephalic Pulmonary: normal non-labored breathing Cardiac: regular, without  Murmurs, rubs or gallops Abdomen:  soft, NT/ND Vascular Exam/Pulses: Left DP pulse palpable Left groin as pictured with some fat necrosis and  apparent seroma medially with serous drainage Extremities: without ischemic changes Musculoskeletal: no muscle wasting or atrophy  Neurologic: A&O X 3; Appropriate Affect ; SENSATION: normal; MOTOR FUNCTION:  moving all extremities equally. Speech is fluent/normal         CBC    Component Value Date/Time   WBC 10.4 12/28/2021 1503   RBC 2.97 (L) 12/28/2021 1503   HGB 9.3 (L) 12/28/2021 1503   HGB 9.4 (L) 10/12/2021 1623   HCT 29.0 (L) 12/28/2021 1503   HCT 26.4 (L) 10/12/2021 1623   PLT 489 (H) 12/28/2021 1503   PLT 327 10/12/2021 1623   MCV 97.6 12/28/2021 1503   MCV 110 (H) 10/12/2021 1623   MCH 31.3 12/28/2021 1503   MCHC 32.1 12/28/2021 1503   RDW 21.7 (H) 12/28/2021 1503   RDW 13.2 10/12/2021 1623   LYMPHSABS 1.8 12/28/2021 1503   MONOABS 0.8 12/28/2021 1503   EOSABS 0.2 12/28/2021 1503   BASOSABS 0.1 12/28/2021 1503    BMET    Component Value Date/Time   NA 135 12/28/2021 1503   NA 139 10/12/2021 1623   K 3.5 12/28/2021 1503   CL 104 12/28/2021 1503   CO2 23 12/28/2021 1503   GLUCOSE 100 (H) 12/28/2021 1503   BUN 28 (H) 12/28/2021 1503   BUN 17 10/12/2021 1623   CREATININE 1.20 12/28/2021 1503   CREATININE 0.91 09/15/2015 1508   CALCIUM 8.8 (L) 12/28/2021 1503   GFRNONAA >60 12/28/2021 1503   GFRAA >60 02/09/2020 1344    COAGS: Lab Results  Component Value Date   INR 1.1 12/09/2021  INR 1.08 09/15/2015   INR 1.07 08/03/2015     Non-Invasive Vascular Imaging:    N/A   ASSESSMENT/PLAN: This is a 76 y.o. male  with multiple medical issues including hyperlipidemia, coronary artery disease, PVD that presents for evaluation of ongoing drainage from his left groin.  Patient had a left common femoral endarterectomy including profundoplasty with bovine patch with left common femoral to above-knee popliteal bypass with PTFE and left iliac stenting on 12/09/2021.  He then had to go back for a washout with Dr. Virl Cagey on 12/17/2021 for post-op hematoma.  I  did probe the left groin at bedside in the ED and appears to have a seroma.  He has had ongoing serous drainage with wound separation.  I have recommended proceeding to the OR tomorrow for washout of the left groin, debridement, and VAC placement and will make sure there is no exposed graft.  We will start on broad-spectrum antibiotics on Vanc Zosyn until he goes to the OR tomorrow given risk for underlying graft infection which we are hoping to avoid.  He can eat tonight.  N.p.o. after midnight.  Marty Heck, MD [facility] Office: [place]

## 2022-01-07 NOTE — Progress Notes (Signed)
Pharmacy Antibiotic Note  Aaron Sexton is a 76 y.o. male admitted on 01/07/2022 with  L groin surgical wound infection . Pt had L common femoral endarterectomy including profundoplasty with L common femoral to above knee popliteal bypass and L iliac stenting on 12/09/21. He underwent a washout of the surgical site on 12/17/21. Pt continues to have persistent serous drainage from L groin despite taking Bactrim SS 1 tab BID since 01/03/22. Pharmacy has been consulted for Cefepime and Vancomycin dosing.  Cefazolin 2g IV q8h was initiated in ED on 9/2 -  pt received 1 dose.  Plan: Start Cefepime 2g IV q12h Discontinue Cefazolin  Continue Metronidazole '500mg'$  IV q12h per MD Initiate loading dose of Vancomycin '2000mg'$  IV x 1, followed by  Vancomycin '1500mg'$  IV q24h (eAUC ~534)    > Goal AUC 400-550    > Check vancomycin levels at steady state  Monitor daily CBC, temp, SCr, and for clinical signs of improvement  F/u cultures and de-escalate antibiotics as able   Height: '5\' 7"'$  (170.2 cm) Weight: 87.8 kg (193 lb 9.6 oz) IBW/kg (Calculated) : 66.1  Temp (24hrs), Avg:97.7 F (36.5 C), Min:97.7 F (36.5 C), Max:97.7 F (36.5 C)  No results for input(s): "WBC", "CREATININE", "LATICACIDVEN", "VANCOTROUGH", "VANCOPEAK", "VANCORANDOM", "GENTTROUGH", "GENTPEAK", "GENTRANDOM", "TOBRATROUGH", "TOBRAPEAK", "TOBRARND", "AMIKACINPEAK", "AMIKACINTROU", "AMIKACIN" in the last 168 hours.  Estimated Creatinine Clearance: 54.5 mL/min (by C-G formula based on SCr of 1.2 mg/dL).    Allergies  Allergen Reactions   Cardizem [Diltiazem Hcl] Hives and Rash    Antimicrobials this admission: Cefazolin 9/2 x1 Cefepime 9/2 >>  Metronidazole 9/2 >>  Vancomycin 9/2 >>   Dose adjustments this admission: N/A  Microbiology results: Pending - to be collected in OR on 9/3  Thank you for allowing pharmacy to be a part of this patient's care.  Luisa Hart, PharmD, BCPS Clinical Pharmacist 01/07/2022 3:32 PM   Please  refer to [facility] for pharmacy phone number

## 2022-01-07 NOTE — Progress Notes (Signed)
Called MD to confirm restart of vancomycin. Verbal order to restart vancomycin at 132m and if pt. develops symptoms to stop infusion and contact pMarvell RN

## 2022-01-07 NOTE — ED Triage Notes (Signed)
Patient here to see vascular due to drainage from left groin following fem-pop in August. Patient alert and oriented, denies pain. No fever, no chills. Vascular MD in triage for evalution

## 2022-01-08 ENCOUNTER — Observation Stay (HOSPITAL_COMMUNITY): Payer: Medicare PPO | Admitting: Anesthesiology

## 2022-01-08 ENCOUNTER — Encounter (HOSPITAL_COMMUNITY)
Admission: EM | Disposition: A | Discharge status: Home Health | Payer: Self-pay | Source: Home / Self Care | Attending: Vascular Surgery

## 2022-01-08 ENCOUNTER — Other Ambulatory Visit: Payer: Self-pay

## 2022-01-08 ENCOUNTER — Encounter (HOSPITAL_COMMUNITY): Payer: Self-pay | Admitting: Vascular Surgery

## 2022-01-08 DIAGNOSIS — K219 Gastro-esophageal reflux disease without esophagitis: Secondary | ICD-10-CM | POA: Diagnosis present

## 2022-01-08 DIAGNOSIS — N529 Male erectile dysfunction, unspecified: Secondary | ICD-10-CM | POA: Diagnosis present

## 2022-01-08 DIAGNOSIS — I2510 Atherosclerotic heart disease of native coronary artery without angina pectoris: Secondary | ICD-10-CM | POA: Diagnosis present

## 2022-01-08 DIAGNOSIS — Z79899 Other long term (current) drug therapy: Secondary | ICD-10-CM | POA: Diagnosis not present

## 2022-01-08 DIAGNOSIS — E785 Hyperlipidemia, unspecified: Secondary | ICD-10-CM | POA: Diagnosis present

## 2022-01-08 DIAGNOSIS — I10 Essential (primary) hypertension: Secondary | ICD-10-CM | POA: Diagnosis present

## 2022-01-08 DIAGNOSIS — Z87891 Personal history of nicotine dependence: Secondary | ICD-10-CM

## 2022-01-08 DIAGNOSIS — T8131XA Disruption of external operation (surgical) wound, not elsewhere classified, initial encounter: Secondary | ICD-10-CM | POA: Diagnosis present

## 2022-01-08 DIAGNOSIS — S31109A Unspecified open wound of abdominal wall, unspecified quadrant without penetration into peritoneal cavity, initial encounter: Secondary | ICD-10-CM | POA: Diagnosis not present

## 2022-01-08 DIAGNOSIS — E538 Deficiency of other specified B group vitamins: Secondary | ICD-10-CM | POA: Diagnosis present

## 2022-01-08 DIAGNOSIS — Z86010 Personal history of colonic polyps: Secondary | ICD-10-CM | POA: Diagnosis not present

## 2022-01-08 DIAGNOSIS — Z8701 Personal history of pneumonia (recurrent): Secondary | ICD-10-CM | POA: Diagnosis not present

## 2022-01-08 DIAGNOSIS — Z888 Allergy status to other drugs, medicaments and biological substances status: Secondary | ICD-10-CM | POA: Diagnosis not present

## 2022-01-08 DIAGNOSIS — Z7982 Long term (current) use of aspirin: Secondary | ICD-10-CM | POA: Diagnosis not present

## 2022-01-08 DIAGNOSIS — Y838 Other surgical procedures as the cause of abnormal reaction of the patient, or of later complication, without mention of misadventure at the time of the procedure: Secondary | ICD-10-CM | POA: Diagnosis present

## 2022-01-08 DIAGNOSIS — L719 Rosacea, unspecified: Secondary | ICD-10-CM | POA: Diagnosis present

## 2022-01-08 DIAGNOSIS — M19042 Primary osteoarthritis, left hand: Secondary | ICD-10-CM | POA: Diagnosis present

## 2022-01-08 DIAGNOSIS — M1990 Unspecified osteoarthritis, unspecified site: Secondary | ICD-10-CM

## 2022-01-08 DIAGNOSIS — Z7902 Long term (current) use of antithrombotics/antiplatelets: Secondary | ICD-10-CM | POA: Diagnosis not present

## 2022-01-08 DIAGNOSIS — M19041 Primary osteoarthritis, right hand: Secondary | ICD-10-CM | POA: Diagnosis present

## 2022-01-08 DIAGNOSIS — L7682 Other postprocedural complications of skin and subcutaneous tissue: Secondary | ICD-10-CM | POA: Diagnosis not present

## 2022-01-08 DIAGNOSIS — I679 Cerebrovascular disease, unspecified: Secondary | ICD-10-CM | POA: Diagnosis present

## 2022-01-08 DIAGNOSIS — M170 Bilateral primary osteoarthritis of knee: Secondary | ICD-10-CM | POA: Diagnosis present

## 2022-01-08 DIAGNOSIS — Z8249 Family history of ischemic heart disease and other diseases of the circulatory system: Secondary | ICD-10-CM | POA: Diagnosis not present

## 2022-01-08 HISTORY — PX: APPLICATION OF WOUND VAC: SHX5189

## 2022-01-08 HISTORY — PX: INCISION AND DRAINAGE OF WOUND: SHX1803

## 2022-01-08 LAB — URINALYSIS, ROUTINE W REFLEX MICROSCOPIC
Bilirubin Urine: NEGATIVE
Glucose, UA: NEGATIVE mg/dL
Hgb urine dipstick: NEGATIVE
Ketones, ur: NEGATIVE mg/dL
Leukocytes,Ua: NEGATIVE
Nitrite: NEGATIVE
Protein, ur: NEGATIVE mg/dL
Specific Gravity, Urine: 1.013 (ref 1.005–1.030)
pH: 5 (ref 5.0–8.0)

## 2022-01-08 LAB — BASIC METABOLIC PANEL
Anion gap: 6 (ref 5–15)
BUN: 26 mg/dL — ABNORMAL HIGH (ref 8–23)
CO2: 23 mmol/L (ref 22–32)
Calcium: 8.9 mg/dL (ref 8.9–10.3)
Chloride: 109 mmol/L (ref 98–111)
Creatinine, Ser: 0.97 mg/dL (ref 0.61–1.24)
GFR, Estimated: >60 mL/min (ref >60)
Glucose, Bld: 158 mg/dL — ABNORMAL HIGH (ref 70–99)
Potassium: 3.8 mmol/L (ref 3.5–5.1)
Sodium: 138 mmol/L (ref 135–145)

## 2022-01-08 LAB — CBC
HCT: 23.8 % — ABNORMAL LOW (ref 39.0–52.0)
Hemoglobin: 7.8 g/dL — ABNORMAL LOW (ref 13.0–17.0)
MCH: 32.2 pg (ref 26.0–34.0)
MCHC: 32.8 g/dL (ref 30.0–36.0)
MCV: 98.3 fL (ref 80.0–100.0)
Platelets: 295 10*3/uL (ref 150–400)
RBC: 2.42 MIL/uL — ABNORMAL LOW (ref 4.22–5.81)
RDW: 24.1 % — ABNORMAL HIGH (ref 11.5–15.5)
WBC: 9.2 10*3/uL (ref 4.0–10.5)
nRBC: 0.3 % — ABNORMAL HIGH (ref 0.0–0.2)

## 2022-01-08 LAB — GLUCOSE, CAPILLARY: Glucose-Capillary: 101 mg/dL — ABNORMAL HIGH (ref 70–99)

## 2022-01-08 LAB — PROTIME-INR
INR: 1.2 (ref 0.8–1.2)
Prothrombin Time: 15.1 seconds (ref 11.4–15.2)

## 2022-01-08 LAB — MRSA NEXT GEN BY PCR, NASAL: MRSA by PCR Next Gen: NOT DETECTED

## 2022-01-08 SURGERY — IRRIGATION AND DEBRIDEMENT WOUND
Anesthesia: General | Site: Groin | Laterality: Left

## 2022-01-08 MED ORDER — FENTANYL CITRATE (PF) 250 MCG/5ML IJ SOLN
INTRAMUSCULAR | Status: DC | PRN
  Administered 2022-01-08: 50 ug via INTRAVENOUS

## 2022-01-08 MED ORDER — FENTANYL CITRATE (PF) 250 MCG/5ML IJ SOLN
INTRAMUSCULAR | Status: AC
  Filled 2022-01-08: qty 5

## 2022-01-08 MED ORDER — DEXAMETHASONE SODIUM PHOSPHATE 10 MG/ML IJ SOLN
INTRAMUSCULAR | Status: DC | PRN
  Administered 2022-01-08: 5 mg via INTRAVENOUS

## 2022-01-08 MED ORDER — LABETALOL HCL 5 MG/ML IV SOLN: 10.0000 mg | INTRAVENOUS | Status: DC | PRN

## 2022-01-08 MED ORDER — KETOROLAC TROMETHAMINE 30 MG/ML IJ SOLN
INTRAMUSCULAR | Status: AC
  Filled 2022-01-08: qty 1

## 2022-01-08 MED ORDER — OXYCODONE-ACETAMINOPHEN 5-325 MG PO TABS
1.0000 | ORAL_TABLET | ORAL | Status: DC | PRN
  Administered 2022-01-08 – 2022-01-12 (×8): 2 via ORAL
  Filled 2022-01-08 (×8): qty 2

## 2022-01-08 MED ORDER — PHENYLEPHRINE 80 MCG/ML (10ML) SYRINGE FOR IV PUSH (FOR BLOOD PRESSURE SUPPORT)
PREFILLED_SYRINGE | INTRAVENOUS | Status: DC | PRN
  Administered 2022-01-08: 80 ug via INTRAVENOUS

## 2022-01-08 MED ORDER — LACTATED RINGERS IV SOLN: INTRAVENOUS | Status: DC | PRN

## 2022-01-08 MED ORDER — PROPOFOL 10 MG/ML IV BOLUS
INTRAVENOUS | Status: DC | PRN
  Administered 2022-01-08: 140 mg via INTRAVENOUS

## 2022-01-08 MED ORDER — PHENYLEPHRINE 80 MCG/ML (10ML) SYRINGE FOR IV PUSH (FOR BLOOD PRESSURE SUPPORT)
PREFILLED_SYRINGE | INTRAVENOUS | Status: AC
  Filled 2022-01-08: qty 10

## 2022-01-08 MED ORDER — BISACODYL 10 MG RE SUPP
10.0000 mg | Freq: Every day | RECTAL | Status: DC | PRN
Start: 2022-01-08 — End: 2022-01-12

## 2022-01-08 MED ORDER — SODIUM CHLORIDE 0.9 % IV SOLN
INTRAVENOUS | Status: DC
Start: 2022-01-08 — End: 2022-01-12

## 2022-01-08 MED ORDER — POLYETHYLENE GLYCOL 3350 17 G PO PACK: 17.0000 g | PACK | Freq: Every day | ORAL | Status: DC | PRN

## 2022-01-08 MED ORDER — ONDANSETRON HCL 4 MG/2ML IJ SOLN
INTRAMUSCULAR | Status: DC | PRN
  Administered 2022-01-08: 4 mg via INTRAVENOUS

## 2022-01-08 MED ORDER — SODIUM CHLORIDE 0.9 % IV SOLN
2.0000 g | Freq: Three times a day (TID) | INTRAVENOUS | Status: DC
  Administered 2022-01-08 – 2022-01-09 (×2): 2 g via INTRAVENOUS
  Filled 2022-01-08 (×2): qty 12.5

## 2022-01-08 MED ORDER — PHENYLEPHRINE HCL-NACL 20-0.9 MG/250ML-% IV SOLN
INTRAVENOUS | Status: DC | PRN
  Administered 2022-01-08: 50 ug via INTRAVENOUS

## 2022-01-08 MED ORDER — EPHEDRINE 5 MG/ML INJ
INTRAVENOUS | Status: AC
  Filled 2022-01-08: qty 5

## 2022-01-08 MED ORDER — LIDOCAINE 2% (20 MG/ML) 5 ML SYRINGE
INTRAMUSCULAR | Status: DC | PRN
  Administered 2022-01-08: 30 mg via INTRAVENOUS

## 2022-01-08 MED ORDER — CHLORHEXIDINE GLUCONATE 0.12 % MT SOLN: 15.0000 mL | Freq: Once | OROMUCOSAL | Status: AC

## 2022-01-08 MED ORDER — LACTATED RINGERS IV SOLN: INTRAVENOUS | Status: DC

## 2022-01-08 MED ORDER — PROPOFOL 10 MG/ML IV BOLUS
INTRAVENOUS | Status: AC
  Filled 2022-01-08: qty 20

## 2022-01-08 MED ORDER — LIDOCAINE 2% (20 MG/ML) 5 ML SYRINGE
INTRAMUSCULAR | Status: AC
Start: 2022-01-08 — End: ?
  Filled 2022-01-08: qty 5

## 2022-01-08 MED ORDER — CHLORHEXIDINE GLUCONATE 0.12 % MT SOLN
OROMUCOSAL | Status: AC
  Administered 2022-01-08: 15 mL via OROMUCOSAL
  Filled 2022-01-08: qty 15

## 2022-01-08 MED ORDER — ACETAMINOPHEN 500 MG PO TABS: 1000.0000 mg | ORAL_TABLET | Freq: Once | ORAL | Status: DC

## 2022-01-08 MED ORDER — 0.9 % SODIUM CHLORIDE (POUR BTL) OPTIME
TOPICAL | Status: DC | PRN
  Administered 2022-01-08: 2000 mL

## 2022-01-08 MED ORDER — ORAL CARE MOUTH RINSE
15.0000 mL | Freq: Once | OROMUCOSAL | Status: AC
Start: 2022-01-08 — End: 2022-01-08

## 2022-01-08 MED ORDER — AMISULPRIDE (ANTIEMETIC) 5 MG/2ML IV SOLN: 10.0000 mg | Freq: Once | INTRAVENOUS | Status: DC | PRN

## 2022-01-08 MED ORDER — ONDANSETRON HCL 4 MG/2ML IJ SOLN
INTRAMUSCULAR | Status: AC
  Filled 2022-01-08: qty 2

## 2022-01-08 MED ORDER — POTASSIUM CHLORIDE CRYS ER 20 MEQ PO TBCR
20.0000 meq | EXTENDED_RELEASE_TABLET | Freq: Once | ORAL | Status: AC
  Administered 2022-01-08: 20 meq via ORAL
  Filled 2022-01-08: qty 1

## 2022-01-08 MED ORDER — FENTANYL CITRATE (PF) 100 MCG/2ML IJ SOLN: 25.0000 ug | INTRAMUSCULAR | Status: DC | PRN

## 2022-01-08 MED ORDER — HYDROMORPHONE HCL 1 MG/ML IJ SOLN
0.5000 mg | INTRAMUSCULAR | Status: DC | PRN
  Administered 2022-01-08: 0.5 mg via INTRAVENOUS
  Administered 2022-01-10 (×2): 1 mg via INTRAVENOUS
  Filled 2022-01-08: qty 1
  Filled 2022-01-08: qty 0.5
  Filled 2022-01-08: qty 1

## 2022-01-08 SURGICAL SUPPLY — 55 items
BAG COUNTER SPONGE SURGICOUNT (BAG) ×2 IMPLANT
BAG SPNG CNTER NS LX DISP (BAG) ×1
BNDG ELASTIC 4X5.8 VLCR STR LF (GAUZE/BANDAGES/DRESSINGS) IMPLANT
BNDG ELASTIC 6X5.8 VLCR STR LF (GAUZE/BANDAGES/DRESSINGS) IMPLANT
BNDG GAUZE DERMACEA FLUFF 4 (GAUZE/BANDAGES/DRESSINGS) IMPLANT
BNDG GZE DERMACEA 4 6PLY (GAUZE/BANDAGES/DRESSINGS)
CANISTER SUCT 3000ML PPV (MISCELLANEOUS) ×2 IMPLANT
CANISTER WOUNDNEG PRESSURE 500 (CANNISTER) IMPLANT
CLIP VESOCCLUDE MED 6/CT (CLIP) ×2 IMPLANT
CLIP VESOCCLUDE SM WIDE 6/CT (CLIP) ×2 IMPLANT
COVER SURGICAL LIGHT HANDLE (MISCELLANEOUS) ×2 IMPLANT
DRAPE HALF SHEET 40X57 (DRAPES) IMPLANT
DRAPE INCISE 23X17 IOBAN STRL (DRAPES) ×1
DRAPE INCISE 23X17 STRL (DRAPES) IMPLANT
DRAPE INCISE IOBAN 23X17 STRL (DRAPES) ×1 IMPLANT
DRAPE INCISE IOBAN 66X45 STRL (DRAPES) IMPLANT
DRAPE U-SHAPE 76X120 STRL (DRAPES) IMPLANT
DRSG COVADERM 4X8 (GAUZE/BANDAGES/DRESSINGS) IMPLANT
DRSG VAC ATS SM SENSATRAC (GAUZE/BANDAGES/DRESSINGS) IMPLANT
ELECT REM PT RETURN 9FT ADLT (ELECTROSURGICAL) ×1
ELECTRODE REM PT RTRN 9FT ADLT (ELECTROSURGICAL) ×2 IMPLANT
GAUZE SPONGE 4X4 12PLY STRL (GAUZE/BANDAGES/DRESSINGS) ×2 IMPLANT
GAUZE XEROFORM 5X9 LF (GAUZE/BANDAGES/DRESSINGS) IMPLANT
GLOVE BIO SURGEON STRL SZ7 (GLOVE) IMPLANT
GLOVE BIO SURGEON STRL SZ7.5 (GLOVE) ×2 IMPLANT
GLOVE BIOGEL PI IND STRL 8 (GLOVE) ×2 IMPLANT
GLOVE BIOGEL PI INDICATOR 8 (GLOVE) ×1
GOWN STRL REUS W/ TWL LRG LVL3 (GOWN DISPOSABLE) ×4 IMPLANT
GOWN STRL REUS W/ TWL XL LVL3 (GOWN DISPOSABLE) ×2 IMPLANT
GOWN STRL REUS W/TWL LRG LVL3 (GOWN DISPOSABLE) ×3
GOWN STRL REUS W/TWL XL LVL3 (GOWN DISPOSABLE) ×1
HANDPIECE INTERPULSE COAX TIP (DISPOSABLE)
IV NS IRRIG 3000ML ARTHROMATIC (IV SOLUTION) ×2 IMPLANT
KIT BASIN OR (CUSTOM PROCEDURE TRAY) ×2 IMPLANT
KIT TURNOVER KIT B (KITS) ×2 IMPLANT
NS IRRIG 1000ML POUR BTL (IV SOLUTION) ×2 IMPLANT
PACK CV ACCESS (CUSTOM PROCEDURE TRAY) IMPLANT
PACK GENERAL/GYN (CUSTOM PROCEDURE TRAY) IMPLANT
PACK UNIVERSAL I (CUSTOM PROCEDURE TRAY) ×2 IMPLANT
PAD ARMBOARD 7.5X6 YLW CONV (MISCELLANEOUS) ×4 IMPLANT
PENCIL BUTTON HOLSTER BLD 10FT (ELECTRODE) IMPLANT
POWDER MYRIAD MORCELLS 1000MG (Miscellaneous) IMPLANT
PULSAVAC PLUS IRRIG FAN TIP (DISPOSABLE)
SET HNDPC FAN SPRY TIP SCT (DISPOSABLE) IMPLANT
SPONGE T-LAP 18X18 ~~LOC~~+RFID (SPONGE) IMPLANT
SUT ETHILON 3 0 PS 1 (SUTURE) IMPLANT
SUT VIC AB 2-0 CTX 36 (SUTURE) IMPLANT
SUT VIC AB 3-0 SH 27 (SUTURE)
SUT VIC AB 3-0 SH 27X BRD (SUTURE) IMPLANT
SUT VICRYL 4-0 PS2 18IN ABS (SUTURE) IMPLANT
SWAB CULTURE ESWAB REG 1ML (MISCELLANEOUS) IMPLANT
SWAB CULTURE LIQ STUART DBL (MISCELLANEOUS) IMPLANT
TIP FAN IRRIG PULSAVAC PLUS (DISPOSABLE) IMPLANT
TOWEL GREEN STERILE (TOWEL DISPOSABLE) ×2 IMPLANT
WATER STERILE IRR 1000ML POUR (IV SOLUTION) ×2 IMPLANT

## 2022-01-08 NOTE — Op Note (Signed)
Date: January 08, 2022  Preoperative diagnosis: Left groin wound with serous drainage following recent left femoral endarterectomy with bovine patch and left femoral to above knee popliteal bypass with PTFE  Postoperative diagnosis: Same  Procedure: 1.  Irrigation and sharp excisional debridement of left groin wound including skin and subcutaneous tissue (wound measures 10 cm long by 2 cm wide by 4 cm deep) 2.  Placement of skin substitute in left groin wound (myriad morcels) 3.  Placement of negative pressure wound VAC in left groin wound  Surgeon: Dr. Marty Heck, MD  Assistant: Vicente Serene, PA  Indications: Patient is a 76 year old male that underwent left common femoral endarterectomy including profundoplasty with bovine patch as well as a left common femoral to above-knee popliteal bypass with PTFE and left iliac stenting on 12/09/2021 for CLI with tissue loss.  He has developed increasing serous drainage in his left groin wound with breakdown.  He presents today for operative exploration with debridement and likely VAC placement after risk benefits discussed.  Findings: Left groin wound had significant fat necrosis in the subcutaneous tissue with no obvious purulent drainage.  There was no exposure of the graft and deeper in the wound there appeared to be good granulation tissue around the endarterectomy and femoropopliteal bypass.  Cultures were sent.  Sharp excisional debridement was performed with Metzenbaum scissors and after we got healthy subcutaneous tissue we placed myriad morcels and a negative pressure wound VAC with a small black sponge.  Anesthesia: General  Details: Patient was taken to the operating room after informed consent was obtained.  Placed on operative table supine position.  General endotracheal anesthesia was induced.  The left groin and left thigh were then prepped and draped in standard sterile fashion.  He was up-to-date on his antibiotics.  Timeout  was performed.  Initially used hemostats to remove the staples in his above-knee popliteal incision.  We then turned our attention to the left groin where I removed all of his existing staples.  I was able to immediately open the wound by placing my finger in the cavity and there was a larger cavity under the surface with significant fat necrosis of subcutaneous tissue.  We did send cultures although there was no obvious purulent drainage.  We then used Metzenbaum scissors and performed sharp excisional debridement of the skin edges as well as the subcutaneous tissue where there was significant fat necrosis.  Deep in the wound there appeared to be good granulation tissue with incorporation of the graft and no obvious exposure of the graft or endarterectomy site.  Once we got to healthy tissue margins the wound measured about 10 cm long by 2 cm wide by 4 cm deep.  There was good bleeding.  We placed myriad morcels in the wound after it was copiously irrigated with 2 L of saline.  Small black sponge was then placed with a negative pressure wound VAC.  Taken to recovery in stable condition.  Complications: None  Condition: Stable  Marty Heck, MD [facility] [place] Office: [place]

## 2022-01-08 NOTE — Progress Notes (Signed)
[facility] [place]  Subjective  - no complaints.   Objective 119/68 68 98 F (36.7 C) (Oral) 20 96%  Intake/Output Summary (Last 24 hours) at 01/08/2022 0730 Last data filed at 01/08/2022 0554 Gross per 24 hour  Intake 99.97 ml  Output 850 ml  Net -750.03 ml    Left groin wound with wet to dry packing Left DP palpable  Laboratory Lab Results: Recent Labs    01/07/22 1436 01/08/22 0257  WBC 7.9 9.2  HGB 8.4* 7.8*  HCT 26.1* 23.8*  PLT 339 295   BMET Recent Labs    01/07/22 1436 01/08/22 0033  NA 136 138  K 3.5 3.8  CL 105 109  CO2 23 23  GLUCOSE 102* 158*  BUN 15 26*  CREATININE 1.22 0.97  CALCIUM 8.7* 8.9    COAG Lab Results  Component Value Date   INR 1.1 12/09/2021   INR 1.08 09/15/2015   INR 1.07 08/03/2015   No results found for: "PTT"  Assessment/Planning:  Recent left femoral endarterectomy with left fem pop bypass and left iliac stenting.  Plan left groin debridement with vac placement.  Risk and benefits again discussed.    Marty Heck 01/08/2022 7:30 AM --

## 2022-01-08 NOTE — Progress Notes (Signed)
PHARMACY NOTE:  ANTIMICROBIAL RENAL DOSAGE ADJUSTMENT  Current antimicrobial regimen includes a mismatch between antimicrobial dosage and estimated renal function.  As per policy approved by the Pharmacy & Therapeutics and Medical Executive Committees, the antimicrobial dosage will be adjusted accordingly.  Current antimicrobial dosage:  Cefepime 2g IV every 12 hours  Indication: wound infection  Renal Function:  Estimated Creatinine Clearance: 67.5 mL/min (by C-G formula based on SCr of 0.97 mg/dL). '[]'$      On intermittent HD, scheduled: '[]'$      On CRRT    Antimicrobial dosage has been changed to:  cefepime 2g IV every 8 hours  Additional comments:   Thank you for allowing pharmacy to be a part of this patient's care.  Antonietta Jewel, PharmD, [place] Clinical Pharmacist  Phone: [phone] 01/08/2022 3:49 PM  Please check AMION for all [place] phone numbers After 10:00 PM, call [place] [phone]

## 2022-01-08 NOTE — Plan of Care (Signed)
?  Problem: Education: ?Goal: Knowledge of General Education information will improve ?Description: Including pain rating scale, medication(s)/side effects and non-pharmacologic comfort measures ?Outcome: Progressing ?  ?Problem: Clinical Measurements: ?Goal: Respiratory complications will improve ?Outcome: Progressing ?  ?Problem: Activity: ?Goal: Risk for activity intolerance will decrease ?Outcome: Progressing ?  ?Problem: Nutrition: ?Goal: Adequate nutrition will be maintained ?Outcome: Progressing ?  ?Problem: Coping: ?Goal: Level of anxiety will decrease ?Outcome: Progressing ?  ?Problem: Pain Managment: ?Goal: General experience of comfort will improve ?Outcome: Progressing ?  ?

## 2022-01-08 NOTE — Anesthesia Preprocedure Evaluation (Addendum)
Anesthesia Evaluation  Patient identified by MRN, date of birth, ID band Patient awake    Reviewed: Allergy & Precautions, NPO status , Patient's Chart, lab work & pertinent test results, reviewed documented beta blocker date and time Preop documentation limited or incomplete due to emergent nature of procedure.  History of Anesthesia Complications (+) PONV and history of anesthetic complications  Airway Mallampati: I  TM Distance: >3 FB Neck ROM: Full    Dental  (+) Dental Advisory Given, Partial Lower, Partial Upper   Pulmonary former smoker,    Pulmonary exam normal breath sounds clear to auscultation       Cardiovascular hypertension, Pt. on medications and Pt. on home beta blockers + Peripheral Vascular Disease (recent femoral-popliteal bypass graft/stent insertion)  Normal cardiovascular exam Rhythm:Regular Rate:Normal     Neuro/Psych negative neurological ROS     GI/Hepatic Neg liver ROS, GERD  ,  Endo/Other  negative endocrine ROSObesity   Renal/GU negative Renal ROS     Musculoskeletal  (+) Arthritis ,   Abdominal   Peds  Hematology  (+) Blood dyscrasia (Plavix), anemia ,   Anesthesia Other Findings   Reproductive/Obstetrics                            Anesthesia Physical  Anesthesia Plan  ASA: 3  Anesthesia Plan: General   Post-op Pain Management: Ofirmev IV (intra-op)*   Induction: Intravenous and Rapid sequence  PONV Risk Score and Plan: 2 and Dexamethasone and Ondansetron  Airway Management Planned: LMA  Additional Equipment:   Intra-op Plan:   Post-operative Plan: Extubation in OR  Informed Consent: I have reviewed the patients History and Physical, chart, labs and discussed the procedure including the risks, benefits and alternatives for the proposed anesthesia with the patient or authorized representative who has indicated his/her understanding and  acceptance.     Dental advisory given  Plan Discussed with: CRNA  Anesthesia Plan Comments:         Anesthesia Quick Evaluation

## 2022-01-08 NOTE — Anesthesia Procedure Notes (Signed)
Procedure Name: LMA Insertion Date/Time: 01/08/2022 7:48 AM  Performed by: Eligha Bridegroom, CRNAPre-anesthesia Checklist: Patient identified, Emergency Drugs available, Suction available, Patient being monitored and Timeout performed Patient Re-evaluated:Patient Re-evaluated prior to induction Oxygen Delivery Method: Circle system utilized Preoxygenation: Pre-oxygenation with 100% oxygen Induction Type: IV induction LMA: LMA inserted LMA Size: 4.0 Placement Confirmation: positive ETCO2 and breath sounds checked- equal and bilateral Tube secured with: Tape Dental Injury: Teeth and Oropharynx as per pre-operative assessment

## 2022-01-08 NOTE — Anesthesia Postprocedure Evaluation (Signed)
Anesthesia Post Note  Patient: Aaron Sexton  Procedure(s) Performed: IRRIGATION AND DEBRIDEMENT GROIN LEFT application of Myriad Morcells. (Left: Groin) APPLICATION OF WOUND VAC left groin. (Left: Groin)     Patient location during evaluation: PACU Anesthesia Type: General Level of consciousness: awake and alert Pain management: pain level controlled Vital Signs Assessment: post-procedure vital signs reviewed and stable Respiratory status: spontaneous breathing, nonlabored ventilation, respiratory function stable and patient connected to nasal cannula oxygen Cardiovascular status: blood pressure returned to baseline and stable Postop Assessment: no apparent nausea or vomiting Anesthetic complications: no   No notable events documented.  Last Vitals:  Vitals:   01/08/22 0958 01/08/22 1700  BP: (!) 104/94   Pulse:  74  Resp: 16 16  Temp: 36.5 C   SpO2: 98% 94%    Last Pain:  Vitals:   01/08/22 1330  TempSrc:   PainSc: 0-No pain                 Tiajuana Amass

## 2022-01-08 NOTE — Transfer of Care (Signed)
Immediate Anesthesia Transfer of Care Note  Patient: Aaron Sexton  Procedure(s) Performed: IRRIGATION AND DEBRIDEMENT GROIN LEFT application of Myriad Morcells. (Left: Groin) APPLICATION OF WOUND VAC left groin. (Left: Groin)  Patient Location: PACU  Anesthesia Type:General  Level of Consciousness: awake and alert   Airway & Oxygen Therapy: Patient Spontanous Breathing and Patient connected to nasal cannula oxygen  Post-op Assessment: Report given to RN and Post -op Vital signs reviewed and stable  Post vital signs: Reviewed and stable  Last Vitals:  Vitals Value Taken Time  BP 101/48 01/08/22 0905  Temp    Pulse 75 01/08/22 0909  Resp 28 01/08/22 0909  SpO2 96 % 01/08/22 0909  Vitals shown include unvalidated device data.  Last Pain:  Vitals:   01/08/22 0647  TempSrc: Oral  PainSc: 0-No pain         Complications: No notable events documented.

## 2022-01-09 LAB — COMPREHENSIVE METABOLIC PANEL
ALT: 8 U/L (ref 0–44)
AST: 11 U/L — ABNORMAL LOW (ref 15–41)
Albumin: 2.5 g/dL — ABNORMAL LOW (ref 3.5–5.0)
Alkaline Phosphatase: 36 U/L — ABNORMAL LOW (ref 38–126)
Anion gap: 10 (ref 5–15)
BUN: 15 mg/dL (ref 8–23)
CO2: 15 mmol/L — ABNORMAL LOW (ref 22–32)
Calcium: 7.9 mg/dL — ABNORMAL LOW (ref 8.9–10.3)
Chloride: 113 mmol/L — ABNORMAL HIGH (ref 98–111)
Creatinine, Ser: 1.2 mg/dL (ref 0.61–1.24)
GFR, Estimated: >60 mL/min (ref >60)
Glucose, Bld: 114 mg/dL — ABNORMAL HIGH (ref 70–99)
Potassium: 4.2 mmol/L (ref 3.5–5.1)
Sodium: 138 mmol/L (ref 135–145)
Total Bilirubin: 0.3 mg/dL (ref 0.3–1.2)
Total Protein: 5.2 g/dL — ABNORMAL LOW (ref 6.5–8.1)

## 2022-01-09 LAB — CBC
HCT: 19.4 % — ABNORMAL LOW (ref 39.0–52.0)
Hemoglobin: 6.2 g/dL — CL (ref 13.0–17.0)
MCH: 32.6 pg (ref 26.0–34.0)
MCHC: 32 g/dL (ref 30.0–36.0)
MCV: 102.1 fL — ABNORMAL HIGH (ref 80.0–100.0)
Platelets: 249 10*3/uL (ref 150–400)
RBC: 1.9 MIL/uL — ABNORMAL LOW (ref 4.22–5.81)
RDW: 24.6 % — ABNORMAL HIGH (ref 11.5–15.5)
WBC: 8.7 10*3/uL (ref 4.0–10.5)
nRBC: 0.3 % — ABNORMAL HIGH (ref 0.0–0.2)

## 2022-01-09 LAB — HEMOGLOBIN AND HEMATOCRIT, BLOOD
HCT: 24.8 % — ABNORMAL LOW (ref 39.0–52.0)
Hemoglobin: 8.4 g/dL — ABNORMAL LOW (ref 13.0–17.0)

## 2022-01-09 LAB — GLUCOSE, CAPILLARY: Glucose-Capillary: 89 mg/dL (ref 70–99)

## 2022-01-09 LAB — PREPARE RBC (CROSSMATCH)

## 2022-01-09 MED ORDER — SODIUM CHLORIDE 0.9 % IV SOLN
2.0000 g | Freq: Two times a day (BID) | INTRAVENOUS | Status: DC
  Administered 2022-01-09 – 2022-01-10 (×2): 2 g via INTRAVENOUS
  Filled 2022-01-09 (×2): qty 12.5

## 2022-01-09 MED ORDER — SODIUM CHLORIDE 0.9% IV SOLUTION: Freq: Once | INTRAVENOUS | Status: DC

## 2022-01-09 NOTE — TOC Initial Note (Addendum)
Transition of Care (TOC) - Initial/Assessment Note  Donn Pierini RN, BSN Transitions of Care Unit 4E- RN Case Manager See Treatment Team for direct phone #    Patient Details  Name: Aaron Sexton MRN: [id number] Date of Birth: 03/18/1946  Transition of Care [facility]) CM/SW Contact:    Darrold Span, RN Phone Number: 01/09/2022, 11:57 AM  Clinical Narrative:                 Noted orders for [facility] and DME- wound VAC for transition needs.  CM in to speak with pt at bedside- pt's Minister at bedside (stepped out for a minute) while CM spoke with pt about HH needs.  Discussed home wound vAC need- [facility] home VAC order form pending signature, CM will fax once signed to start process for insurance approval. Msg sent to PA who is to come sign   1530- [facility] order form has been signed and faxed to [facility] - approval pending at this time  Pt voiced he has needed DME at home, wife to transport home.  Address, phone # and PCP all confirmed w/ pt in epic.   List provided for [facility] choice. Per CMS guidelines from medicare.gov website with star ratings (copy placed in shadow chart), Pt reports he believes he is already active with someone, states his wife would know for sure- wife to be here after lunch today- CM will return this afternoon to clarify HH. (Per chart review - pt has been with [facility] on previous admit).   1330- CM returned to room and spoke with pt and wife- confirmed with wife that pt was active with [facility] for therapy- per wife they did not have nursing (on previous d/c pt went home with prevena VAC). Pt and wife confirm they would like to stay with [facility] for [facility] services- [facility] need for home VAC drsg. - CM will contact [facility] and confirm availability for start of care on home Wound VAC drsg changes.  CM confirmed with [facility] (spoke w/ Kandee Keen) nursing should be able to see pt either Thursday or Friday this week for re-start of care with wound VAC drsg change.   TOC to continue to follow for  transition needs and coordinating of care for discharge    Expected Discharge Plan: Home w Home Health Services Barriers to Discharge: Continued Medical Work up   Patient Goals and CMS Choice Patient states their goals for this hospitalization and ongoing recovery are:: return home CMS Medicare.gov Compare Post Acute Care list provided to:: Patient Choice offered to / list presented to : Patient  Expected Discharge Plan and Services Expected Discharge Plan: Home w Home Health Services   Discharge Planning Services: CM Consult Post Acute Care Choice: Home Health, Resumption of [name] Provider Living arrangements for the past 2 months: Single Family Home                 DME Arranged: Vac DME Agency: [facility] Date DME Agency Contacted: 01/09/22 Time DME Agency Contacted: 1156 Representative spoke with at DME Agency: French Ana HH Arranged: RN, PT, OT [facility] Agency: [facility] Date [facility] Agency Contacted: 01/09/22 Time HH Agency Contacted: 1156 Representative spoke with at [facility] Agency: Kandee Keen  Prior Living Arrangements/Services Living arrangements for the past 2 months: Single Family Home Lives with:: Spouse Patient language and need for interpreter reviewed:: Yes Do you feel safe going back to the place where you live?: Yes      Need for Family Participation in Patient Care:  Yes (Comment) Care giver support system in place?: Yes (comment) Current home services: DME (Upright walker, RW, Cane) Criminal Activity/Legal Involvement Pertinent to Current Situation/Hospitalization: No - Comment as needed  Activities of Daily Living      Permission Sought/Granted Permission sought to share information with : Facility Industrial/product designer granted to share information with : Yes, Verbal Permission Granted     Permission granted to share info w AGENCY: HH/DME        Emotional Assessment Appearance:: Appears stated age Attitude/Demeanor/Rapport: Engaged Affect  (typically observed): Accepting, Appropriate, Pleasant Orientation: : Oriented to Self, Oriented to Place, Oriented to  Time, Oriented to Situation Alcohol / Substance Use: Not Applicable Psych Involvement: No (comment)  Admission diagnosis:  Problem involving surgical incision [T81.89XA] PAD (peripheral artery disease) (HCC) [I73.9] Patient Active Problem List   Diagnosis Date Noted   Problem involving surgical incision 01/07/2022   Wound of left leg 12/17/2021   PAD (peripheral artery disease) (HCC) 12/09/2021   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) 11/29/2021   Exposure to COVID-19 virus 06/27/2021   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   Carotid arterial disease (HCC) 11/03/2015   Claudication (HCC) 08/09/2015   Advance directive discussed with patient 08/25/2014   S/P rotator cuff repair 12/02/2013   Right shoulder pain 11/11/2013   Anemia 08/19/2013   Routine general medical examination at a health care facility 08/16/2012   Viral URI with cough 10/03/2011   Adenomatous colon polyp    Vitamin B12 deficiency    Arteriosclerotic cardiovascular disease (ASCVD)    Cerebrovascular disease    Peripheral vascular disease (HCC) 03/11/2010   Obesity, unspecified 03/08/2009   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 [place] [address] Phone: [phone] Fax: [phone]  [id number] [place], [place] - [address] AT [facility] [address] [place] [address] Phone: [phone] Fax: [phone]  [facility] [address] [place] [address] Phone: [phone] Fax: [phone]  [facility] [address] [place] [address] Phone: [phone] Fax:  [phone]     Social Determinants of Health (SDOH) Interventions    Readmission Risk Interventions    12/19/2021   10:04 AM 12/13/2021   10:33 AM  Readmission Risk Prevention Plan  Transportation Screening Complete Complete  PCP or Specialist Appt within 5-7 Days Complete Complete  Home Care Screening Complete Complete  Medication Review (RN CM) Complete Complete

## 2022-01-09 NOTE — Progress Notes (Signed)
PHARMACY NOTE:  ANTIMICROBIAL RENAL DOSAGE ADJUSTMENT  Current antimicrobial regimen includes a mismatch between antimicrobial dosage and estimated renal function.  As per policy approved by the Pharmacy & Therapeutics and Medical Executive Committees, the antimicrobial dosage will be adjusted accordingly.  Current antimicrobial dosage:  Cefepime 2g IV every 8 hours  Indication: wound infection  Renal Function:  Estimated Creatinine Clearance: 54.5 mL/min (by C-G formula based on SCr of 1.2 mg/dL). '[]'$      On intermittent HD, scheduled: '[]'$      On CRRT    Antimicrobial dosage has been changed to:  cefepime 2g IV every 12 hours  Additional comments:   Thank you for allowing pharmacy to be a part of this patient's care.  Wilson Singer, PharmD Clinical Pharmacist 01/09/2022 3:37 PM   Please check AMION for all [place] phone numbers After 10:00 PM, call [place] [phone]

## 2022-01-09 NOTE — Progress Notes (Signed)
   01/09/22 0315  Provider Notification  Provider Name/Title Carlis Abbott  Date Provider Notified 01/09/22  Time Provider Notified 0257  Method of Notification Page  Notification Reason Critical result  Test performed and critical result Hgb 6.2  Date Critical Result Received 01/09/22  Time Critical Result Received 0254  Provider response See new orders  Date of Provider Response 01/09/22  Time of Provider Response [phone]

## 2022-01-09 NOTE — Progress Notes (Addendum)
Progress Note    01/09/2022 8:01 AM 1 Day Post-Op  Subjective:  He is feeling good, endorses some pain in his left groin    Vitals:   01/09/22 0605 01/09/22 0623  BP: (!) 127/57 (!) 123/50  Pulse: 90 60  Resp: 17 (!) 21  Temp: 99 F (37.2 C) 98.6 F (37 C)  SpO2: 98% 100%    Physical Exam: Lungs:  nonlabored Incisions:  L groin wound with wound vac and good seal Extremities:  Brisk L PT doppler signal   CBC    Component Value Date/Time   WBC 8.7 01/09/2022 0133   RBC 1.90 (L) 01/09/2022 0133   HGB 6.2 (LL) 01/09/2022 0133   HGB 9.4 (L) 10/12/2021 1623   HCT 19.4 (L) 01/09/2022 0133   HCT 26.4 (L) 10/12/2021 1623   PLT 249 01/09/2022 0133   PLT 327 10/12/2021 1623   MCV 102.1 (H) 01/09/2022 0133   MCV 110 (H) 10/12/2021 1623   MCH 32.6 01/09/2022 0133   MCHC 32.0 01/09/2022 0133   RDW 24.6 (H) 01/09/2022 0133   RDW 13.2 10/12/2021 1623   LYMPHSABS 1.8 01/07/2022 1436   MONOABS 0.8 01/07/2022 1436   EOSABS 0.2 01/07/2022 1436   BASOSABS 0.1 01/07/2022 1436    BMET    Component Value Date/Time   NA 138 01/09/2022 0133   NA 139 10/12/2021 1623   K 4.2 01/09/2022 0133   CL 113 (H) 01/09/2022 0133   CO2 15 (L) 01/09/2022 0133   GLUCOSE 114 (H) 01/09/2022 0133   BUN 15 01/09/2022 0133   BUN 17 10/12/2021 1623   CREATININE 1.20 01/09/2022 0133   CREATININE 0.91 09/15/2015 1508   CALCIUM 7.9 (L) 01/09/2022 0133   GFRNONAA >60 01/09/2022 0133   GFRAA >60 02/09/2020 1344    INR    Component Value Date/Time   INR 1.2 01/08/2022 1050     Intake/Output Summary (Last 24 hours) at 01/09/2022 0801 Last data filed at 01/09/2022 0724 Gross per 24 hour  Intake 2725.76 ml  Output 1575 ml  Net 1150.76 ml     Assessment/Plan:  76 y.o. male is 1 day post op, s/p: irrigation and debridement of left groin wound. L fem- AK pop bypass with PTFE on 12/09/2021    -Pain well controlled on oral pain meds -L groin wound vac with good seal and good output -Brisk L PT  doppler signal -Hgb 6.2 this morning, currently getting 2 units PRBCs. Check H/H after finishing -On Cefepime, Flagyl, and Vancomycin, still pending cultures -Orders have been placed for [facility] and home wound vac for when patient is ready for discharge -DVT prophylaxis:  lovenox   Vicente Serene, PA-C [facility] [phone] 01/09/2022 8:01 AM   I have seen and evaluated the patient. I agree with the PA note as documented above.  Patient underwent debridement of left groin wound with VAC placement yesterday and application of myriad morsels.  VAC had to be changed on the floor after the tubing became clogged.  Good seal overnight.  He has a palpable DP pulse in the left foot.  OR cultures suggest gram cocci in singles and we will follow.  Remains on broad-spectrum antibiotics.  The graft was not exposed in the wound and appeared to be incorporated in the deep soft tissue which is encouraging.  Plan next [facility] change tomorrow.  Need to get home health for [facility] change and home health RN.  Marty Heck, MD [facility] [place] Office: [phone]

## 2022-01-10 ENCOUNTER — Other Ambulatory Visit (HOSPITAL_COMMUNITY): Payer: Self-pay

## 2022-01-10 ENCOUNTER — Ambulatory Visit: Payer: Medicare PPO

## 2022-01-10 ENCOUNTER — Encounter (HOSPITAL_COMMUNITY): Payer: Self-pay | Admitting: Vascular Surgery

## 2022-01-10 ENCOUNTER — Encounter: Payer: Medicare PPO | Admitting: Internal Medicine

## 2022-01-10 LAB — TYPE AND SCREEN
ABO/RH(D): B POS
Antibody Screen: NEGATIVE
Unit division: 0
Unit division: 0

## 2022-01-10 LAB — CBC
HCT: 24.3 % — ABNORMAL LOW (ref 39.0–52.0)
Hemoglobin: 7.9 g/dL — ABNORMAL LOW (ref 13.0–17.0)
MCH: 31.1 pg (ref 26.0–34.0)
MCHC: 32.5 g/dL (ref 30.0–36.0)
MCV: 95.7 fL (ref 80.0–100.0)
Platelets: 227 10*3/uL (ref 150–400)
RBC: 2.54 MIL/uL — ABNORMAL LOW (ref 4.22–5.81)
RDW: 23.1 % — ABNORMAL HIGH (ref 11.5–15.5)
WBC: 8.8 10*3/uL (ref 4.0–10.5)
nRBC: 0.3 % — ABNORMAL HIGH (ref 0.0–0.2)

## 2022-01-10 LAB — BPAM RBC
Blood Product Expiration Date: 202309192359
Blood Product Expiration Date: 202309212359
ISSUE DATE / TIME: 202309040558
ISSUE DATE / TIME: 202309040942
Unit Type and Rh: 7300
Unit Type and Rh: 7300

## 2022-01-10 LAB — COMPREHENSIVE METABOLIC PANEL
ALT: 8 U/L (ref 0–44)
AST: 10 U/L — ABNORMAL LOW (ref 15–41)
Albumin: 2.6 g/dL — ABNORMAL LOW (ref 3.5–5.0)
Alkaline Phosphatase: 34 U/L — ABNORMAL LOW (ref 38–126)
Anion gap: 6 (ref 5–15)
BUN: 15 mg/dL (ref 8–23)
CO2: 18 mmol/L — ABNORMAL LOW (ref 22–32)
Calcium: 7.8 mg/dL — ABNORMAL LOW (ref 8.9–10.3)
Chloride: 112 mmol/L — ABNORMAL HIGH (ref 98–111)
Creatinine, Ser: 1.02 mg/dL (ref 0.61–1.24)
GFR, Estimated: >60 mL/min (ref >60)
Glucose, Bld: 100 mg/dL — ABNORMAL HIGH (ref 70–99)
Potassium: 4 mmol/L (ref 3.5–5.1)
Sodium: 136 mmol/L (ref 135–145)
Total Bilirubin: 0.7 mg/dL (ref 0.3–1.2)
Total Protein: 5.6 g/dL — ABNORMAL LOW (ref 6.5–8.1)

## 2022-01-10 MED ORDER — SODIUM CHLORIDE 0.9 % IV SOLN
2.0000 g | Freq: Three times a day (TID) | INTRAVENOUS | Status: DC
  Administered 2022-01-10 – 2022-01-11 (×3): 2 g via INTRAVENOUS
  Filled 2022-01-10 (×3): qty 12.5

## 2022-01-10 NOTE — Progress Notes (Addendum)
  Progress Note    01/10/2022 8:02 AM 2 Days Post-Op  Subjective:  some left groin discomfort but overall feels well   Vitals:   01/09/22 1928 01/09/22 2331  BP: 119/63 (!) 136/52  Pulse: 83 78  Resp: 20 19  Temp: 98.2 F (36.8 C) 99.3 F (37.4 C)  SpO2: 96% 98%   Physical Exam: Cardiac:  regular Lungs:  non labored Incisions:  left groin with wound VAC to suction. SS output in vac. Wound well appearing. VAC changed. Some bleeding from bottom of wound bed. Viable tissue present   Extremities:  well perfused and warm with palpable DP pulse in left foot Abdomen:  soft, non distended Neurologic: alert and oriented  CBC    Component Value Date/Time   WBC 8.8 01/10/2022 0204   RBC 2.54 (L) 01/10/2022 0204   HGB 7.9 (L) 01/10/2022 0204   HGB 9.4 (L) 10/12/2021 1623   HCT 24.3 (L) 01/10/2022 0204   HCT 26.4 (L) 10/12/2021 1623   PLT 227 01/10/2022 0204   PLT 327 10/12/2021 1623   MCV 95.7 01/10/2022 0204   MCV 110 (H) 10/12/2021 1623   MCH 31.1 01/10/2022 0204   MCHC 32.5 01/10/2022 0204   RDW 23.1 (H) 01/10/2022 0204   RDW 13.2 10/12/2021 1623   LYMPHSABS 1.8 01/07/2022 1436   MONOABS 0.8 01/07/2022 1436   EOSABS 0.2 01/07/2022 1436   BASOSABS 0.1 01/07/2022 1436    BMET    Component Value Date/Time   NA 136 01/10/2022 0204   NA 139 10/12/2021 1623   K 4.0 01/10/2022 0204   CL 112 (H) 01/10/2022 0204   CO2 18 (L) 01/10/2022 0204   GLUCOSE 100 (H) 01/10/2022 0204   BUN 15 01/10/2022 0204   BUN 17 10/12/2021 1623   CREATININE 1.02 01/10/2022 0204   CREATININE 0.91 09/15/2015 1508   CALCIUM 7.8 (L) 01/10/2022 0204   GFRNONAA >60 01/10/2022 0204   GFRAA >60 02/09/2020 1344    INR    Component Value Date/Time   INR 1.2 01/08/2022 1050     Intake/Output Summary (Last 24 hours) at 01/10/2022 0802 Last data filed at 01/10/2022 7062 Gross per 24 hour  Intake 2717.03 ml  Output 3105 ml  Net -387.97 ml     Assessment/Plan:  76 y.o. male is s/p   irrigation and debridement of left groin wound. L fem- AK pop bypass with PTFE on  2 Days Post-Op   Overall doing well Pain well controlled Left groin VAC changed this morning. Wound well appearing with viable tissue. Some bleeding at base of wound bed. VAC reapplied with good suction Palpable DP pulse  Hgb stable post transfusion 7.9 Continue broad spectrum ABx. Pending final culture results [facility] RN orders placed and has [facility] arranged to possibly start home VAC changes on 9/7 or 9/8 Anticipate d/c home in next day or so Mobilize as tolerated  DVT prophylaxis: lovenox   Karoline Caldwell, PA-C [facility] [phone] 01/10/2022 8:02 AM  I have seen and evaluated the patient. I agree with the PA note as documented above.  VAC change to left groin with good bed of healthy appearing tissue.  Continue vanc plus cefepime and Flagyl.  Awaiting final cultures from the OR that have gram-positive cocci in singles and are preliminary.  Arranging home health for [facility] change and home health nursing.  Palpable DP pulse in the left foot  Marty Heck, MD [facility] [place] Office: [phone]

## 2022-01-10 NOTE — TOC Progression Note (Signed)
Transition of Care (TOC) - Progression Note  [name] RN, BSN Transitions of Care Unit 4E- RN Case Manager See Treatment Team for direct phone #    Patient Details  Name: BERT PTACEK MRN: [id number] Date of Birth: 08-Jul-1945  Transition of Care [facility]) CM/SW Contact  Dahlia Client, Romeo Rabon, RN Phone Number: 01/10/2022, 2:11 PM  Clinical Narrative:    CM has been notified by [employer] that pt approved for home wound VAC- [employer] will send POD for home vac delivery pending discharge readiness.  CM will notify [employer] when ready for POD to be sent.  And deliver ready home VAC to the bedside for discharge.    TOC to continue to follow.    Expected Discharge Plan: [place] Barriers to Discharge: Continued Medical Work up  Expected Discharge Plan and Services Expected Discharge Plan: [place]   Discharge Planning Services: CM Consult Post Acute Care Choice: Home Health, Resumption of [name] Provider Living arrangements for the past 2 months: Single Family Home                 DME Arranged: Vac DME Agency: [employer] Date DME Agency Contacted: 01/09/22 Time DME Agency Contacted: [phone] Representative spoke with at DME Agency: [name] HH Arranged: RN, PT, OT [facility] Agency: [place] Date [place]: 01/09/22 Time [place]: 1156 Representative spoke with at [place]: [place] ([place]) Interventions    Readmission Risk Interventions    12/19/2021   10:04 AM 12/13/2021   10:33 AM  Readmission Risk Prevention Plan  Transportation Screening Complete Complete  PCP or Specialist Appt within 5-7 Days Complete Complete  Home Care Screening Complete Complete  Medication Review (RN CM) Complete Complete

## 2022-01-11 MED ORDER — AMOXICILLIN-POT CLAVULANATE 875-125 MG PO TABS
1.0000 | ORAL_TABLET | Freq: Two times a day (BID) | ORAL | Status: DC
  Administered 2022-01-11 – 2022-01-12 (×3): 1 via ORAL
  Filled 2022-01-11 (×3): qty 1

## 2022-01-11 MED ORDER — DOXYCYCLINE HYCLATE 100 MG PO TABS
100.0000 mg | ORAL_TABLET | Freq: Two times a day (BID) | ORAL | Status: DC
  Administered 2022-01-11 – 2022-01-12 (×3): 100 mg via ORAL
  Filled 2022-01-11 (×3): qty 1

## 2022-01-11 NOTE — Progress Notes (Signed)
Pharmacy Antibiotic Note  Aaron Sexton is a 76 y.o. male admitted on 01/07/2022 with  L groin surgical wound infection . Pt had L common femoral endarterectomy including profundoplasty with L common femoral to above knee popliteal bypass and L iliac stenting on 12/09/21. He underwent a washout of the surgical site on 12/17/21. Pt continues to have persistent serous drainage from L groin despite taking Bactrim SS 1 tab BID since 01/03/22. Pharmacy is consulted for Cefepime and Vancomycin dosing.  Today is D5 of vancomycin, cefepime, and metronidazole. Scr is stable. Patient remains afebrile. Susceptibilities still pending.   Plan: Continue Metronidazole '500mg'$  IV q12h per MD Continue Cefepime 2g IV q8h Continue vancomycin '1500mg'$  IV q24h (eAUC ~534)    > Goal AUC 400-550    > 3 hour infusion due to complaint of red man syndrome on 9/2 Monitor daily CBC, temp, SCr, and for clinical signs of improvement  F/u cultures and de-escalate antibiotics as able  F/u plan to switch to PO antibiotics  Height: '5\' 7"'$  (170.2 cm) Weight: 87.8 kg (193 lb 9.6 oz) IBW/kg (Calculated) : 66.1  Temp (24hrs), Avg:97.9 F (36.6 C), Min:97.4 F (36.3 C), Max:98.3 F (36.8 C)  Recent Labs  Lab 01/07/22 1436 01/08/22 0033 01/08/22 0257 01/09/22 0133 01/10/22 0204  WBC 7.9  --  9.2 8.7 8.8  CREATININE 1.22 0.97  --  1.20 1.02    Estimated Creatinine Clearance: 64.2 mL/min (by C-G formula based on SCr of 1.02 mg/dL).    Allergies  Allergen Reactions   Cardizem [Diltiazem Hcl] Hives and Rash    Antimicrobials this admission: Cefazolin 9/2 x1 Cefepime 9/2 >>  Metronidazole 9/2 >>  Vancomycin 9/2 >>   Dose adjustments this admission: N/A  Microbiology results: 9/3 wound culture (L groin): rare gram positive cocci in singles, rare staph epi - susceptibilities pending  Thank you for allowing pharmacy to be a part of this patient's care.  Rebbeca Paul, PharmD Clinical Pharmacist 01/11/2022 9:30  AM  Please refer to [facility] for pharmacy phone number

## 2022-01-11 NOTE — Progress Notes (Signed)
CM has delivered home wound VAC to bedside- POD signed by patient and has been faxed back to [employer].

## 2022-01-11 NOTE — Progress Notes (Addendum)
  Progress Note    01/11/2022 7:38 AM 3 Days Post-Op  Subjective:  says he is feeling well this morning. Not having any pain    Vitals:   01/11/22 0030 01/11/22 0448  BP: (!) 112/57 (!) 103/45  Pulse: 72 60  Resp: 19 15  Temp: 98 F (36.7 C) 97.6 F (36.4 C)  SpO2: 99% 100%   Physical Exam: Cardiac:  regular Lungs:  non labored Incisions:  left groin incision with VAC to suction. SS output about 50 cc  Extremities:  well perfused and warm with palpable DP pulses bilaterally Abdomen:  soft, non distended Neurologic: alert and oriented  CBC    Component Value Date/Time   WBC 8.8 01/10/2022 0204   RBC 2.54 (L) 01/10/2022 0204   HGB 7.9 (L) 01/10/2022 0204   HGB 9.4 (L) 10/12/2021 1623   HCT 24.3 (L) 01/10/2022 0204   HCT 26.4 (L) 10/12/2021 1623   PLT 227 01/10/2022 0204   PLT 327 10/12/2021 1623   MCV 95.7 01/10/2022 0204   MCV 110 (H) 10/12/2021 1623   MCH 31.1 01/10/2022 0204   MCHC 32.5 01/10/2022 0204   RDW 23.1 (H) 01/10/2022 0204   RDW 13.2 10/12/2021 1623   LYMPHSABS 1.8 01/07/2022 1436   MONOABS 0.8 01/07/2022 1436   EOSABS 0.2 01/07/2022 1436   BASOSABS 0.1 01/07/2022 1436    BMET    Component Value Date/Time   NA 136 01/10/2022 0204   NA 139 10/12/2021 1623   K 4.0 01/10/2022 0204   CL 112 (H) 01/10/2022 0204   CO2 18 (L) 01/10/2022 0204   GLUCOSE 100 (H) 01/10/2022 0204   BUN 15 01/10/2022 0204   BUN 17 10/12/2021 1623   CREATININE 1.02 01/10/2022 0204   CREATININE 0.91 09/15/2015 1508   CALCIUM 7.8 (L) 01/10/2022 0204   GFRNONAA >60 01/10/2022 0204   GFRAA >60 02/09/2020 1344    INR    Component Value Date/Time   INR 1.2 01/08/2022 1050     Intake/Output Summary (Last 24 hours) at 01/11/2022 0738 Last data filed at 01/11/2022 0706 Gross per 24 hour  Intake 2160 ml  Output 2025 ml  Net 135 ml     Assessment/Plan:  76 y.o. male is s/p  irrigation and debridement of left groin wound from prior L fem- AK pop bypass with PTFE  3  Days Post-Op   Pain well controlled Left groin with VAC to suction. 50 cc SS output Next wound VAC change will be tomorrow if patient  Palpable DP pulses bilaterally Continue Vanc, Cefepime, Flagyl. Susceptibilities pending. Hopefully can transition to PO for discharge later today vs tomorrow [facility] VAC and RN arranged pending dispo  Marval Regal [facility] [phone] 01/11/2022 7:38 AM  I have seen and evaluated the patient. I agree with the PA note as documented above.  Awaiting left groin cultures to finalize.  Staph epidermidis.  Next VAC change tomorrow.  Has a palpable DP pulse in the left foot.  Continue broad-spectrum antibiotics.  Hopefully home tomorrow with a home VAC.  Marty Heck, MD [facility] [place] Office: [phone]

## 2022-01-11 NOTE — Care Management Important Message (Signed)
Important Message  Patient Details  Name: Aaron Sexton MRN: [id number] Date of Birth: November 17, 1945   Medicare Important Message Given:  Yes     Shelda Altes 01/11/2022, 8:18 AM

## 2022-01-12 MED ORDER — DOXYCYCLINE HYCLATE 100 MG PO TABS: 100.0000 mg | ORAL_TABLET | Freq: Two times a day (BID) | ORAL | 0 refills | Status: DC

## 2022-01-12 MED ORDER — AMOXICILLIN-POT CLAVULANATE 875-125 MG PO TABS: 1.0000 | ORAL_TABLET | Freq: Two times a day (BID) | ORAL | 0 refills | Status: DC

## 2022-01-12 MED ORDER — TRAMADOL HCL 50 MG PO TABS: 50.0000 mg | ORAL_TABLET | Freq: Three times a day (TID) | ORAL | 0 refills | Status: DC | PRN

## 2022-01-12 NOTE — TOC Transition Note (Signed)
Transition of Care (TOC) - CM/SW Discharge Note [name] RN, BSN Transitions of Care Unit 4E- RN Case Manager See Treatment Team for direct phone #    Patient Details  Name: Aaron Sexton MRN: [id number] Date of Birth: 01-24-1946  Transition of Care [facility]) CM/SW Contact:  [name], RN Phone Number: 01/12/2022, 10:42 AM   Clinical Narrative:    Pt stable for transition home today, home wound VAC and supplies at the bedside for transition home. Bedside RN to transition pt over to home [name] prior to discharge. Vac drsg change has been done this AM by vascular.    Spoke with [name] with Vascular- clarification done regarding next VAC drsg need- Vascular ok with next drsg being Sun/Mon pending availability of [place] nursing. Also confirmed that pt can resume [facility] PT/OT therapy. Orders placed per verbal order for resumption of HHPT/OT    HH services has been resumed with [name]- CM spoke with [name] at [name]- confirmed they can do a [place] visit for home VAC drsg change on Monday with [facility], they will also schedule PTOT visits for next week.  Info has been placed on AVS.   RNCM will sign off for now as intervention is no longer needed. Please re-consult  if new needs arise, or contact RNCM assigned to treatment team for further questions/concerns.       Final next level of care: [name]: Barriers Resolved   Patient Goals and CMS Choice Patient states their goals for this hospitalization and ongoing recovery are:: return home CMS Medicare.gov Compare Post Acute Care list provided to:: Patient Choice offered to / list presented to : Patient  Discharge Placement               Home w/ [facility]        Discharge Plan and Services   Discharge Planning Services: CM Consult Post Acute Care Choice: Home Health, Resumption of [name] Provider          DME Arranged: Vac DME Agency: [employer] Date DME Agency Contacted: 01/09/22 Time DME  Agency Contacted: [id number] Representative spoke with at DME Agency: [name] HH Arranged: RN, PT, OT [facility] Agency: [name] Date [place]: 01/12/22 Time [name]: 1042 Representative spoke with at [place]: [name] Interventions     Readmission Risk Interventions    01/12/2022   10:42 AM 12/19/2021   10:04 AM 12/13/2021   10:33 AM  Readmission Risk Prevention Plan  Transportation Screening Complete Complete Complete  PCP or Specialist Appt within 5-7 Days Complete Complete Complete  Home Care Screening Complete Complete Complete  Medication Review (RN CM) Complete Complete Complete

## 2022-01-12 NOTE — Progress Notes (Signed)
01/12/2022 1230 Wound vac changed over to home wound vac without any difficulty.  Instructed pt/wife/son on how to change canister if needed.   Carney Corners

## 2022-01-12 NOTE — Progress Notes (Addendum)
  Progress Note    01/12/2022 8:09 AM 4 Days Post-Op  Subjective:  no complaints ready for discharge home   Vitals:   01/11/22 2315 01/12/22 0344  BP: (!) 144/72 138/64  Pulse: 71 100  Resp: 20 20  Temp: 97.9 F (36.6 C) 97.8 F (36.6 C)  SpO2: 97% 96%   Physical Exam Lungs:  non labored Incisions:  L groin vac changed; healthy appearing wound bed without exposed graft material; appears more shallow than prior picture on 9/5 Extremities:  palpale L DP Neurologic: A&O  CBC    Component Value Date/Time   WBC 8.8 01/10/2022 0204   RBC 2.54 (L) 01/10/2022 0204   HGB 7.9 (L) 01/10/2022 0204   HGB 9.4 (L) 10/12/2021 1623   HCT 24.3 (L) 01/10/2022 0204   HCT 26.4 (L) 10/12/2021 1623   PLT 227 01/10/2022 0204   PLT 327 10/12/2021 1623   MCV 95.7 01/10/2022 0204   MCV 110 (H) 10/12/2021 1623   MCH 31.1 01/10/2022 0204   MCHC 32.5 01/10/2022 0204   RDW 23.1 (H) 01/10/2022 0204   RDW 13.2 10/12/2021 1623   LYMPHSABS 1.8 01/07/2022 1436   MONOABS 0.8 01/07/2022 1436   EOSABS 0.2 01/07/2022 1436   BASOSABS 0.1 01/07/2022 1436    BMET    Component Value Date/Time   NA 136 01/10/2022 0204   NA 139 10/12/2021 1623   K 4.0 01/10/2022 0204   CL 112 (H) 01/10/2022 0204   CO2 18 (L) 01/10/2022 0204   GLUCOSE 100 (H) 01/10/2022 0204   BUN 15 01/10/2022 0204   BUN 17 10/12/2021 1623   CREATININE 1.02 01/10/2022 0204   CREATININE 0.91 09/15/2015 1508   CALCIUM 7.8 (L) 01/10/2022 0204   GFRNONAA >60 01/10/2022 0204   GFRAA >60 02/09/2020 1344    INR    Component Value Date/Time   INR 1.2 01/08/2022 1050     Intake/Output Summary (Last 24 hours) at 01/12/2022 0809 Last data filed at 01/12/2022 0345 Gross per 24 hour  Intake --  Output 1620 ml  Net -1620 ml     Assessment/Plan:  76 y.o. male is s/p  irrigation and debridement of left groin wound. L fem- AK pop bypass with PTFE  4 Days Post-Op   LLE well perfused with palpable DP Vac changed this morning, wound  bed appears more shallow than prior picture.  No graft material or purulence.  Healthy bleeding tissue throughout Based on pharmacy recommendations, patient will be discharged on doxy and augmentin HH arranged by [facility] Office will arrange 2 week visit; ok for discharge home     Dagoberto Ligas, [place] [facility] [phone] 01/12/2022 8:09 AM   I have seen and evaluated the patient. I agree with the PA note as documented above.  Left groin VAC changed today and the wound looks great.  Home health VAC has arrived.  Plan discharge with VAC change 3 times a week with homehealth.  Will need 2 weeks of doxycycline and Augmentin with cultures growing Staph epidermidis.  We will arrange follow-up in 2 weeks in the office.  Palpable pulse in the left foot after recent bypass.  Discussed he call with questions or concerns.  Marty Heck, MD [facility] [place] Office: [phone]

## 2022-01-12 NOTE — Progress Notes (Signed)
01/12/2022 1:05 PM Discharge AVS meds taken today and those due this evening reviewed.  Follow-up appointments and when to call md reviewed.  D/C IV and TELE.  Questions and concerns addressed.   D/C home per orders.  Carney Corners

## 2022-01-13 ENCOUNTER — Encounter: Payer: Self-pay | Admitting: *Deleted

## 2022-01-13 ENCOUNTER — Telehealth: Payer: Self-pay | Admitting: *Deleted

## 2022-01-13 LAB — AEROBIC/ANAEROBIC CULTURE W GRAM STAIN (SURGICAL/DEEP WOUND)

## 2022-01-13 NOTE — Patient Outreach (Signed)
  Care Coordination [facility] Note Transition Care Management Follow-up Telephone Call Date of discharge and from where: 01/12/22 [facility] How have you been since you were released from the hospital? "I am doing great and not having any problems; I am enjoying being back home and eating my wife's good cooking" Any questions or concerns? No- no clinical concerns, however, wife describes an issue with the "forms that were faxed to the [place] for the wound vac;" states the company needs a "date of delivery" on the form that is not currently on the form they have in their office; spouse reports [employer] contacted her this morning asking the wife to fax an updated form to them-- but patient/ family does not have access to a fax machine: I facilitated outreach from inpatient RN TOC CM Steffanie Dunn Dahlia Client) via secure chat-- Steffanie Dunn confirms she will follow up with patient to assist with this issue and ensure corrected form is faxed to DME company  Items Reviewed: Did the pt receive and understand the discharge instructions provided? Yes  Medications obtained and verified? Yes  Other?  N/A Any new allergies since your discharge? No  Dietary orders reviewed? Yes Do you have support at home? Yes  wife and local adult son assisting as indicated/ needed  Home Care and Equipment/Supplies: Were home health services ordered? yes If so, what is the name of the agency? [facility]  Has the agency set up a time to come to the patient's home? yes Were any new equipment or medical supplies ordered?  Yes: wound vacuum What is the name of the medical supply agency? [employer] Were you able to get the supplies/equipment? yes Do you have any questions related to the use of the equipment or supplies? No  Functional Questionnaire: (I = Independent and D = Dependent) ADLs: I  wife assisting as indicated/ needed  Bathing/Dressing- I  Meal Prep- D wife prepares food  Eating- I  Maintaining continence-  I  Transferring/Ambulation- I  Managing Meds- I wife assists as indicated/ needed  Follow up appointments reviewed:  PCP Hospital f/u appt confirmed? No  Scheduled to see - on - @ [facility] f/u appt confirmed? No  Scheduled to see - on - @ - wife reports she will contact vascular surgeon today to schedule this appointment- as instructed per AVS discharge instructions (verified); confirmed spouse has phone number for vascular surgeon office Are transportation arrangements needed? No  If their condition worsens, is the pt aware to call PCP or go to the Emergency Dept.? Yes Was the patient provided with contact information for the PCP's office or ED? Yes Was to pt encouraged to call back with questions or concerns? Yes  SDOH assessments and interventions completed:   Yes  Care Coordination Interventions Activated:  Yes   Care Coordination Interventions:   care coordination outreach completed with inpatient RN CM: wife describes issue with form for DME; facilitated follow up call with inpatient RN CM TOC nurse for re-faxing of forms     Encounter Outcome:  Pt. Visit Completed    Oneta Rack, RN, BSN, CCRN Alumnus RN CM Care Coordination/ Transition of [place] Management [phone]: direct office

## 2022-01-13 NOTE — Discharge Summary (Signed)
Discharge Summary  Patient ID: REGGINALD MOSCOSO [id number] 76 y.o. [birth year]  Admit date: 01/07/2022  Discharge date and time: 01/12/2022  1:08 PM   Admitting Physician: Cephus Shelling, MD   Discharge Physician: same  Admission Diagnoses: Problem involving surgical incision [T81.89XA] PAD (peripheral artery disease) (HCC) [I73.9]  Discharge Diagnoses: same  Admission Condition: fair  Discharged Condition: fair  Indication for Admission: wound care  Hospital Course: Mr. Khadim Hayen is a 76 year old male who underwent left femoral endarterectomy with bovine patch angioplasty he had femoral to popliteal bypass with PTFE.  He developed wound dehiscence of the left groin incision as well as serous drainage.  He presented to the emergency department due to worsening drainage.  He was brought to the operating room by Dr. Chestine Spore 01/08/2022 and underwent debridement with placement of skin substitute and a wound VAC.  He tolerated the procedure well and was admitted to the hospital postoperatively.  Hospital stay consisted of wound care involving wound VAC changes with wound care nurse.  Based on intraoperative cultures and pharmacy recommendations, he was placed on Augmentin and doxycycline by the time of discharge.  TOC arranged HH RN for wound vac changes and also resumed HH PT.  Over the course of hospitalization, L groin wound bed appeared to be healing well with no sign of tunneling or exposed prosthetic material.  He maintained a palpable L DP pulse throughout his hospital stay.  He will follow up in office in 2 weeks for a wound check.  He was discharged home in stable condition.  Consults: None  Treatments: surgery: Debridement of nonhealing left groin incision with placement of skin substitute and a wound VAC by Dr. Chestine Spore 01/08/2022  Discharge Exam: See progress note 01/12/22 Vitals:   01/12/22 0813 01/12/22 0816  BP: (!) 148/65   Pulse: 77 86  Resp: 20   Temp: 98.6 F (37 C)    SpO2: 96%      Disposition: Discharge disposition: 01-Home or Self Care       Patient Instructions:  Allergies as of 01/12/2022       Reactions   Cardizem [diltiazem Hcl] Hives, Rash        Medication List     STOP taking these medications    cimetidine 400 MG tablet Commonly known as: TAGAMET   sulfamethoxazole-trimethoprim 400-80 MG tablet Commonly known as: BACTRIM       TAKE these medications    acetaminophen 500 MG tablet Commonly known as: TYLENOL Take 500 mg by mouth every 8 (eight) hours as needed for moderate pain. Take with Tramadol   amoxicillin-clavulanate 875-125 MG tablet Commonly known as: AUGMENTIN Take 1 tablet by mouth every 12 (twelve) hours.   aspirin 81 MG tablet Take 81 mg by mouth every evening.   chlorthalidone 25 MG tablet Commonly known as: HYGROTON TAKE 1/2 TABLET(12.5 MG) BY MOUTH DAILY What changed:  how much to take how to take this when to take this additional instructions   clopidogrel 75 MG tablet Commonly known as: PLAVIX TAKE 1 TABLET(75 MG) BY MOUTH DAILY WITH BREAKFAST   Deferasirox 180 MG Tabs TAKE 4 TABLETS BY MOUTH DAILY What changed:  how much to take how to take this when to take this additional instructions   doxycycline 100 MG tablet Commonly known as: VIBRA-TABS Take 1 tablet (100 mg total) by mouth every 12 (twelve) hours.   Fish Oil 1000 MG Caps Take 1,000 mg by mouth daily.   loperamide 2 MG tablet Commonly  known as: IMODIUM A-D Take 2 mg by mouth 4 (four) times daily as needed for diarrhea or loose stools.   losartan 25 MG tablet Commonly known as: COZAAR TAKE ONE TABLET BY MOUTH EVERY DAY   metoprolol succinate 100 MG 24 hr tablet Commonly known as: TOPROL-XL TAKE 1 TABLET(100 MG) BY MOUTH DAILY WITH OR IMMEDIATELY FOLLOWING A MEAL What changed: See the new instructions.   nitroGLYCERIN 0.4 MG SL tablet Commonly known as: NITROSTAT Place 1 tablet (0.4 mg total) under the tongue  every 5 (five) minutes as needed for chest pain.   pravastatin 40 MG tablet Commonly known as: PRAVACHOL TAKE 1 TABLET(40 MG) BY MOUTH DAILY   traMADol 50 MG tablet Commonly known as: ULTRAM Take 1 tablet (50 mg total) by mouth every 8 (eight) hours as needed for moderate pain or severe pain.   Vitamin B-12 500 MCG Subl Place 500 mcg under the tongue daily.   Vitamin D (Ergocalciferol) 1.25 MG (50000 UNIT) Caps capsule Commonly known as: DRISDOL TAKE 1 CAPSULE BY MOUTH 1 TIME A WEEK       Activity: activity as tolerated Diet: regular diet Wound Care:  Wound VAC changes 2-3 times per week  Follow-up with [facility] in 2 weeks.  Signed: Emilie Rutter, PA-C 01/13/2022 10:13 AM [facility] Office: [phone]

## 2022-01-17 ENCOUNTER — Other Ambulatory Visit (HOSPITAL_COMMUNITY): Payer: Self-pay

## 2022-01-18 ENCOUNTER — Other Ambulatory Visit (HOSPITAL_COMMUNITY): Payer: Self-pay

## 2022-01-31 ENCOUNTER — Encounter: Payer: Self-pay | Admitting: Vascular Surgery

## 2022-01-31 ENCOUNTER — Ambulatory Visit (INDEPENDENT_AMBULATORY_CARE_PROVIDER_SITE_OTHER): Payer: Medicare PPO | Admitting: Vascular Surgery

## 2022-01-31 VITALS — BP 129/71 | HR 73 | Temp 98.2°F | Resp 16 | Ht 67.0 in | Wt 188.0 lb

## 2022-01-31 DIAGNOSIS — I70222 Atherosclerosis of native arteries of extremities with rest pain, left leg: Secondary | ICD-10-CM

## 2022-01-31 MED ORDER — DOXYCYCLINE HYCLATE 100 MG PO TBEC: 100.0000 mg | DELAYED_RELEASE_TABLET | Freq: Two times a day (BID) | ORAL | 0 refills | Status: DC

## 2022-01-31 NOTE — Progress Notes (Signed)
Patient name: Aaron Sexton MRN: [id number] DOB: Nov 20, 1945 Sex: male  REASON FOR VISIT: Postop check  HPI: Aaron Sexton is a 76 y.o. male with multiple medical comorbidities who presents for postop check.  He underwent a left common femoral endarterectomy with a left common femoral to above-knee popliteal bypass with PTFE including left iliac artery stenting on 12/09/2021 for CLI with tissue loss.  He ultimately required washout of a hematoma on 12/17/2021.  He then had a breakdown of his left groin incision requiring I&D with myriad and a VAC on 01/08/2022.  His cultures grew Staph epidermidis.  His graft was noted exposed.  He has completed his 2 weeks of Augmentin and doxycycline.  Left groin is healing with a VAC.  No concerns today.  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   Cerebrovascular disease    Moderate ASVD without focal stenosis in 01/2007   Colon polyps 07/2010   single 2mm polyp--tubular adenoma   Diverticulosis 2012   found on colonoscopy   Erectile dysfunction    Family history of adverse reaction to anesthesia    difficult for son & sistor to wake    GERD (gastroesophageal reflux disease)    Hematochezia 05/24/2010   Hyperlipidemia    Hypertension    Osteoarthritis    knees/hands-Dr Sherlean Foot   Overweight(278.02)    Peripheral vascular disease (HCC) 03/11/2010   Moderate SFA stenosis; history of claudication   Pneumonia    Rosacea    Tobacco abuse, in remission    30-40 pack years discontinued in 1993   Vitamin B12 deficiency     Past Surgical History:  Procedure Laterality Date   APPENDECTOMY     APPLICATION OF WOUND VAC Left 12/17/2021   Procedure: APPLICATION OF PREVENA WOUND VAC LEFT GROIN;  Surgeon: Victorino Sparrow, MD;  Location: [facility] OR;  Service: Vascular;  Laterality: Left;   APPLICATION OF WOUND VAC Left 01/08/2022   Procedure: APPLICATION  OF WOUND VAC left groin.;  Surgeon: Cephus Shelling, MD;  Location: [facility] OR;  Service: Vascular;  Laterality: Left;   COLONOSCOPY W/ POLYPECTOMY  2012   ENDARTERECTOMY FEMORAL Left 12/09/2021   Procedure: LEFT COMMON FEMORAL ENDARTERECTOMY WITH 1 CM X 6 CM XENOSURE BOVINE PATCH ANGIOPLASTY;  Surgeon: Cephus Shelling, MD;  Location: [facility] OR;  Service: Vascular;  Laterality: Left;   FEMORAL-POPLITEAL BYPASS GRAFT Left 12/09/2021   Procedure: LEFT FEMORAL- ABOVE KNEE POPLITEAL BYPASS WITH 6 mm x 80 cm PROPATEN GRAFT;  Surgeon: Cephus Shelling, MD;  Location: [facility] OR;  Service: Vascular;  Laterality: Left;  INSERT ARTERIAL LINE   ILIAC VEIN ANGIOPLASTY / STENTING Right 08/09/2015   INCISION AND DRAINAGE OF WOUND Left 01/08/2022   Procedure: IRRIGATION AND DEBRIDEMENT GROIN LEFT application of Myriad Morcells.;  Surgeon: Cephus Shelling, MD;  Location: [facility] OR;  Service: Vascular;  Laterality: Left;   INSERTION OF ILIAC STENT  12/09/2021   Procedure: INSERTION OF LEFT COMMON AND EXTERNAL  ILIAC STENTS WITH ANGIOPLASTY;  Surgeon: Cephus Shelling, MD;  Location: [facility] OR;  Service: Vascular;;   LOWER EXTREMITY ANGIOGRAM Left 12/09/2021   Procedure: LEFT ILIAC AND LOWER EXTREMITY ANGIOGRAM;  Surgeon: Cephus Shelling, MD;  Location[facility] OR;  Service: Vascular;  Laterality: Left;   LOWER EXTREMITY ANGIOGRAPHY Bilateral 11/03/2021   Procedure: Lower Extremity Angiography;  Surgeon: Runell Gess, MD;  Location: [facility] INVASIVE CV LAB;  Service: Cardiovascular;  Laterality: Bilateral;  Limited Study   PERIPHERAL VASCULAR CATHETERIZATION Bilateral 06/21/2015   Procedure: Lower Extremity Angiography;  Surgeon: Runell Gess, MD;  Location: [facility] INVASIVE CV LAB;  Service: Cardiovascular;  Laterality: Bilateral;   PERIPHERAL VASCULAR CATHETERIZATION N/A 06/21/2015   Procedure: Abdominal Aortogram;  Surgeon: Runell Gess, MD;  Location: [facility] INVASIVE CV LAB;  Service: Cardiovascular;  Laterality: N/A;    PERIPHERAL VASCULAR CATHETERIZATION N/A 07/26/2015   Procedure: Lower Extremity Angiography;  Surgeon: Runell Gess, MD;  Location: [facility] INVASIVE CV LAB;  Service: Cardiovascular;  Laterality: N/A;   PERIPHERAL VASCULAR CATHETERIZATION N/A 08/09/2015   Procedure: Lower Extremity Angiography;  Surgeon: Runell Gess, MD;  Location: [facility] INVASIVE CV LAB;  Service: Cardiovascular;  Laterality: N/A;   PERIPHERAL VASCULAR CATHETERIZATION  08/09/2015   Procedure: Peripheral Vascular Intervention;  Surgeon: Runell Gess, MD;  Location: [facility] INVASIVE CV LAB;  Service: Cardiovascular;;  rt ext. iliac atherectomy and stent   PERIPHERAL VASCULAR CATHETERIZATION N/A 09/20/2015   Procedure: Lower Extremity Angiography;  Surgeon: Runell Gess, MD;  Location: [facility] INVASIVE CV LAB;  Service: Cardiovascular;  Laterality: N/A;   PERIPHERAL VASCULAR CATHETERIZATION Right 09/20/2015   Procedure: Peripheral Vascular Intervention;  Surgeon: Runell Gess, MD;  Location: [facility] INVASIVE CV LAB;  Service: Cardiovascular;  Laterality: Right;  SFA   ROTATOR CUFF REPAIR Left 7/15   Dr Sherlean Foot   WOUND EXPLORATION Left 12/17/2021   Procedure: LEFT LEG WOUND EXPLORATION AND WASH OUT WITH MYRIAD MORCELLS;  Surgeon: Victorino Sparrow, MD;  Location: [facility] OR;  Service: Vascular;  Laterality: Left;    Family History  Problem Relation Age of Onset   Hypertension Father        And siblings   Heart disease Father        And second-degree relatives   Transient ischemic attack Father    Lung cancer Mother    Diabetes Mother    Brain cancer Sister    Diabetes Brother    Atrial fibrillation Brother    Heart disease Sister    Heart disease Sister    Thyroid disease Sister    Ulcers Son    Diabetes Son    Diabetes Son     SOCIAL HISTORY: Social History   Tobacco Use   Smoking status: Former    Packs/day: 1.00    Years: 30.00    Total pack years: 30.00    Types: Cigarettes    Quit date: 05/16/1990    Years since quitting:  31.7   Smokeless tobacco: Never   Tobacco comments:    Quit in 1993  Substance Use Topics   Alcohol use: No    Alcohol/week: 0.0 standard drinks of alcohol    Allergies  Allergen Reactions   Cardizem [Diltiazem Hcl] Hives and Rash    Current Outpatient Medications  Medication Sig Dispense Refill   acetaminophen (TYLENOL) 500 MG tablet Take 500 mg by mouth every 8 (eight) hours as needed for moderate pain. Take with Tramadol     aspirin 81 MG tablet Take 81 mg by mouth every evening.     chlorthalidone (HYGROTON) 25 MG tablet TAKE 1/2 TABLET(12.5 MG) BY MOUTH DAILY (Patient taking differently: Take 12.5 mg by mouth daily.) 30 tablet 3   clopidogrel (PLAVIX) 75 MG tablet TAKE 1 TABLET(75 MG) BY MOUTH DAILY WITH BREAKFAST 30 tablet 9   Cyanocobalamin (VITAMIN B-12) 500 MCG SUBL Place 500 mcg under the  tongue daily.     Deferasirox 180 MG TABS TAKE 4 TABLETS BY MOUTH DAILY (Patient taking differently: Take 720 mg by mouth daily. Take on empty stomach) 120 tablet 2   loperamide (IMODIUM A-D) 2 MG tablet Take 2 mg by mouth 4 (four) times daily as needed for diarrhea or loose stools.     losartan (COZAAR) 25 MG tablet TAKE ONE TABLET BY MOUTH EVERY DAY (Patient taking differently: Take 25 mg by mouth daily.) 90 tablet 3   metoprolol succinate (TOPROL-XL) 100 MG 24 hr tablet TAKE 1 TABLET(100 MG) BY MOUTH DAILY WITH OR IMMEDIATELY FOLLOWING A MEAL (Patient taking differently: Take 100 mg by mouth daily.) 90 tablet 3   nitroGLYCERIN (NITROSTAT) 0.4 MG SL tablet Place 1 tablet (0.4 mg total) under the tongue every 5 (five) minutes as needed for chest pain. 25 tablet 1   Omega-3 Fatty Acids (FISH OIL) 1000 MG CAPS Take 1,000 mg by mouth daily.     pravastatin (PRAVACHOL) 40 MG tablet TAKE 1 TABLET(40 MG) BY MOUTH DAILY 90 tablet 3   traMADol (ULTRAM) 50 MG tablet Take 1 tablet (50 mg total) by mouth every 8 (eight) hours as needed for moderate pain or severe pain. 20 tablet 0   Vitamin D,  Ergocalciferol, (DRISDOL) 1.25 MG (50000 UNIT) CAPS capsule TAKE 1 CAPSULE BY MOUTH 1 TIME A WEEK 12 capsule 2   amoxicillin-clavulanate (AUGMENTIN) 875-125 MG tablet Take 1 tablet by mouth every 12 (twelve) hours. (Patient not taking: Reported on 01/31/2022) 28 tablet 0   doxycycline (VIBRA-TABS) 100 MG tablet Take 1 tablet (100 mg total) by mouth every 12 (twelve) hours. (Patient not taking: Reported on 01/31/2022) 28 tablet 0   Current Facility-Administered Medications  Medication Dose Route Frequency Provider Last Rate Last Admin   sodium chloride flush (NS) 0.9 % injection 3 mL  3 mL Intravenous Q12H Runell Gess, MD        REVIEW OF SYSTEMS:  [X]  denotes positive finding, [ ]  denotes negative finding Cardiac  Comments:  Chest pain or chest pressure:    Shortness of breath upon exertion:    Short of breath when lying flat:    Irregular heart rhythm:        Vascular    Pain in calf, thigh, or hip brought on by ambulation:    Pain in feet at night that wakes you up from your sleep:     Blood clot in your veins:    Leg swelling:         Pulmonary    Oxygen at home:    Productive cough:     Wheezing:         Neurologic    Sudden weakness in arms or legs:     Sudden numbness in arms or legs:     Sudden onset of difficulty speaking or slurred speech:    Temporary loss of vision in one eye:     Problems with dizziness:         Gastrointestinal    Blood in stool:     Vomited blood:         Genitourinary    Burning when urinating:     Blood in urine:        Psychiatric    Major depression:         Hematologic    Bleeding problems:    Problems with blood clotting too easily:        Skin  Rashes or ulcers:        Constitutional    Fever or chills:      PHYSICAL EXAM: Vitals:   01/31/22 1614  BP: 129/71  Pulse: 73  Resp: 16  Temp: 98.2 F (36.8 C)  TempSrc: Temporal  SpO2: 93%  Weight: 188 lb (85.3 kg)  Height: 5\' 7"  (1.702 m)    GENERAL: The  patient is a well-nourished male, in no acute distress. The vital signs are documented above. CARDIAC: There is a regular rate and rhythm.  VASCULAR:  Left groin wound with good granulation tissue as pictured, no significant drainage Left AK pop incision healed Left DP palpable PULMONARY: No respiratory distress ABDOMEN: Soft and non-tender. MUSCULOSKELETAL: There are no major deformities or cyanosis.      DATA:   None  Assessment/Plan:  76 y.o. male with multiple medical comorbidities who presents for postop check.  He underwent a left common femoral endarterectomy with a left common femoral to above-knee popliteal bypass with PTFE including left iliac artery stenting on 12/09/2021 for CLI with tissue loss.  He ultimately required washout of a hematoma on 12/17/2021.  He then had a breakdown of his left groin incision requiring I&D with myriad and a VAC on 01/08/2022.  His cultures grew Staph epidermidis.    There was no obvious graft infection.  The deep tissue was incorporated around the graft.  His groin is healing and looks excellent as pictured above.  I changed his VAC today in the office.  I will put him on another month of doxycycline to be extra cautious given the complexity of his reconstruction requiring femoral endarterectomy with profundoplasty and patch with bovine as well as iliac stenting and femoropopliteal bypass with PTFE.  I will see him again in 2 weeks for wound check.  I think he should continue the [facility] for now and making good progress.   Cephus Shelling, MD [facility] Office: [phone]

## 2022-02-01 ENCOUNTER — Telehealth: Payer: Self-pay

## 2022-02-01 NOTE — Telephone Encounter (Signed)
I received a voicemail from patient's wife stating the pharmacy needed a Prior auth for Patient Doryx (Doxcycline).  I spoke with Imani C. From [facility] to obtain the Prior Auth.   Medication Approved.  Auth Approval #:[id number] Valid Dates: 05/08/2021-05/07/2022 EOC #: [id number]  Phone: [phone] Fax: [phone]  Pharmacy and pt both notified of approval.

## 2022-02-08 ENCOUNTER — Other Ambulatory Visit (HOSPITAL_COMMUNITY): Payer: Self-pay

## 2022-02-08 ENCOUNTER — Other Ambulatory Visit: Payer: Self-pay | Admitting: *Deleted

## 2022-02-08 ENCOUNTER — Other Ambulatory Visit (HOSPITAL_COMMUNITY): Payer: Self-pay | Admitting: Hematology

## 2022-02-08 MED ORDER — DEFERASIROX 180 MG PO TABS
ORAL_TABLET | ORAL | 2 refills | Status: DC
  Filled 2022-02-08: qty 120, 30d supply, fill #0
  Filled 2022-03-09: qty 120, 30d supply, fill #1
  Filled 2022-04-06: qty 120, 30d supply, fill #2

## 2022-02-08 NOTE — Telephone Encounter (Signed)
Refill for Deferasirox approved.  Patient tolerating and is to continue therapy.

## 2022-02-09 ENCOUNTER — Encounter: Payer: Self-pay | Admitting: Internal Medicine

## 2022-02-09 ENCOUNTER — Ambulatory Visit (INDEPENDENT_AMBULATORY_CARE_PROVIDER_SITE_OTHER): Payer: Medicare PPO | Admitting: Internal Medicine

## 2022-02-09 VITALS — BP 134/64 | HR 84 | Temp 98.1°F | Ht 67.0 in | Wt 193.0 lb

## 2022-02-09 DIAGNOSIS — I6523 Occlusion and stenosis of bilateral carotid arteries: Secondary | ICD-10-CM | POA: Diagnosis not present

## 2022-02-09 DIAGNOSIS — I739 Peripheral vascular disease, unspecified: Secondary | ICD-10-CM | POA: Diagnosis not present

## 2022-02-09 DIAGNOSIS — Z23 Encounter for immunization: Secondary | ICD-10-CM | POA: Diagnosis not present

## 2022-02-09 DIAGNOSIS — I10 Essential (primary) hypertension: Secondary | ICD-10-CM | POA: Diagnosis not present

## 2022-02-09 DIAGNOSIS — Z0000 Encounter for general adult medical examination without abnormal findings: Secondary | ICD-10-CM

## 2022-02-09 MED ORDER — LOSARTAN POTASSIUM 25 MG PO TABS
25.0000 mg | ORAL_TABLET | Freq: Every day | ORAL | 3 refills | Status: DC
Start: 2022-02-09 — End: 2023-01-17

## 2022-02-09 NOTE — Progress Notes (Signed)
Subjective:    Patient ID: Aaron Sexton, male    DOB: 10/09/1945, 76 y.o.   MRN: [id number]  HPI Here with wife for Medicare wellness visit and follow up of chronic health conditions Reviewed advanced directives Reviewed other doctors---Dr Branch/Berry --cardiology, Dr Retta Mac surgeon, Dr Theressa Stamps, [facility] dentist, Dr Clydene Pugh, Dr Maryellen Pile Vascular surgery recently Vision is okay Hearing is good No tobacco or alcohol Fell once---slipped out back door. No injury No depression or anhedonia Starting to walk---and still working with PT Wife mostly does the housework/shopping No sig memory issues  Had vascular procedure in August--left iliac stent and common femoral endarterectomy. Has helped the leg pain and can walk better--but had hematoma and bleed with 2 subsequent procedures to stop the bleed and deal with wound. Now healing slowly with wound vac Foot ulcer is now just about healed Continuing on extended doxy Rx  No chest pain No SOB No palpitations No dizziness or syncope Some left leg swelling if down for a while. Better in AM  Continues on the chelation Rx  Current Outpatient Medications on File Prior to Visit  Medication Sig Dispense Refill   aspirin 81 MG tablet Take 81 mg by mouth every evening.     chlorthalidone (HYGROTON) 25 MG tablet TAKE 1/2 TABLET(12.5 MG) BY MOUTH DAILY (Patient taking differently: Take 12.5 mg by mouth daily.) 30 tablet 3   clopidogrel (PLAVIX) 75 MG tablet TAKE 1 TABLET(75 MG) BY MOUTH DAILY WITH BREAKFAST 30 tablet 9   Cyanocobalamin (VITAMIN B-12) 500 MCG SUBL Place 500 mcg under the tongue daily.     Deferasirox 180 MG TABS TAKE 4 TABLETS BY MOUTH DAILY 120 tablet 2   doxycycline (VIBRA-TABS) 100 MG tablet Take 1 tablet (100 mg total) by mouth every 12 (twelve) hours. 28 tablet 0   loperamide (IMODIUM A-D) 2 MG tablet Take 2 mg by mouth 4 (four) times daily as needed for diarrhea or loose stools.      losartan (COZAAR) 25 MG tablet TAKE ONE TABLET BY MOUTH EVERY DAY (Patient taking differently: Take 25 mg by mouth daily.) 90 tablet 3   metoprolol succinate (TOPROL-XL) 100 MG 24 hr tablet TAKE 1 TABLET(100 MG) BY MOUTH DAILY WITH OR IMMEDIATELY FOLLOWING A MEAL (Patient taking differently: Take 100 mg by mouth daily.) 90 tablet 3   nitroGLYCERIN (NITROSTAT) 0.4 MG SL tablet Place 1 tablet (0.4 mg total) under the tongue every 5 (five) minutes as needed for chest pain. 25 tablet 1   Omega-3 Fatty Acids (FISH OIL) 1000 MG CAPS Take 1,000 mg by mouth daily.     pravastatin (PRAVACHOL) 40 MG tablet TAKE 1 TABLET(40 MG) BY MOUTH DAILY 90 tablet 3   traMADol (ULTRAM) 50 MG tablet Take 1 tablet (50 mg total) by mouth every 8 (eight) hours as needed for moderate pain or severe pain. 20 tablet 0   Vitamin D, Ergocalciferol, (DRISDOL) 1.25 MG (50000 UNIT) CAPS capsule TAKE 1 CAPSULE BY MOUTH 1 TIME A WEEK 12 capsule 2   No current facility-administered medications on file prior to visit.    Allergies  Allergen Reactions   Cardizem [Diltiazem Hcl] Hives and Rash    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   Cerebrovascular disease    Moderate ASVD without focal stenosis in 01/2007   Colon polyps 07/2010   single 30m polyp--tubular adenoma   Diverticulosis  2012   found on colonoscopy   Erectile dysfunction    Family history of adverse reaction to anesthesia    difficult for son & sistor to wake    GERD (gastroesophageal reflux disease)    Hematochezia 05/24/2010   Hyperlipidemia    Hypertension    Osteoarthritis    knees/hands-Dr Sherlean Foot   Overweight(278.02)    Peripheral vascular disease (HCC) 03/11/2010   Moderate SFA stenosis; history of claudication   Pneumonia    Rosacea    Tobacco abuse, in remission    30-40 pack years discontinued in 1993   Vitamin B12 deficiency      Past Surgical History:  Procedure Laterality Date   APPENDECTOMY     APPLICATION OF WOUND VAC Left 12/17/2021   Procedure: APPLICATION OF PREVENA WOUND VAC LEFT GROIN;  Surgeon: Victorino Sparrow, MD;  Location: [facility] OR;  Service: Vascular;  Laterality: Left;   APPLICATION OF WOUND VAC Left 01/08/2022   Procedure: APPLICATION OF WOUND VAC left groin.;  Surgeon: Cephus Shelling, MD;  Location: [facility] OR;  Service: Vascular;  Laterality: Left;   COLONOSCOPY W/ POLYPECTOMY  2012   ENDARTERECTOMY FEMORAL Left 12/09/2021   Procedure: LEFT COMMON FEMORAL ENDARTERECTOMY WITH 1 CM X 6 CM XENOSURE BOVINE PATCH ANGIOPLASTY;  Surgeon: Cephus Shelling, MD;  Location: [facility] OR;  Service: Vascular;  Laterality: Left;   FEMORAL-POPLITEAL BYPASS GRAFT Left 12/09/2021   Procedure: LEFT FEMORAL- ABOVE KNEE POPLITEAL BYPASS WITH 6 mm x 80 cm PROPATEN GRAFT;  Surgeon: Cephus Shelling, MD;  Location: [facility] OR;  Service: Vascular;  Laterality: Left;  INSERT ARTERIAL LINE   ILIAC VEIN ANGIOPLASTY / STENTING Right 08/09/2015   INCISION AND DRAINAGE OF WOUND Left 01/08/2022   Procedure: IRRIGATION AND DEBRIDEMENT GROIN LEFT application of Myriad Morcells.;  Surgeon: Cephus Shelling, MD;  Location: [facility] OR;  Service: Vascular;  Laterality: Left;   INSERTION OF ILIAC STENT  12/09/2021   Procedure: INSERTION OF LEFT COMMON AND EXTERNAL  ILIAC STENTS WITH ANGIOPLASTY;  Surgeon: Cephus Shelling, MD;  Location: [facility] OR;  Service: Vascular;;   LOWER EXTREMITY ANGIOGRAM Left 12/09/2021   Procedure: LEFT ILIAC AND LOWER EXTREMITY ANGIOGRAM;  Surgeon: Cephus Shelling, MD;  Location: [facility] OR;  Service: Vascular;  Laterality: Left;   LOWER EXTREMITY ANGIOGRAPHY Bilateral 11/03/2021   Procedure: Lower Extremity Angiography;  Surgeon: Runell Gess, MD;  Location: [facility] INVASIVE CV LAB;  Service: Cardiovascular;  Laterality: Bilateral;  Limited Study   PERIPHERAL VASCULAR CATHETERIZATION Bilateral 06/21/2015   Procedure: Lower  Extremity Angiography;  Surgeon: Runell Gess, MD;  Location: [facility] INVASIVE CV LAB;  Service: Cardiovascular;  Laterality: Bilateral;   PERIPHERAL VASCULAR CATHETERIZATION N/A 06/21/2015   Procedure: Abdominal Aortogram;  Surgeon: Runell Gess, MD;  Location: [facility] INVASIVE CV LAB;  Service: Cardiovascular;  Laterality: N/A;   PERIPHERAL VASCULAR CATHETERIZATION N/A 07/26/2015   Procedure: Lower Extremity Angiography;  Surgeon: Runell Gess, MD;  Location: [facility] INVASIVE CV LAB;  Service: Cardiovascular;  Laterality: N/A;   PERIPHERAL VASCULAR CATHETERIZATION N/A 08/09/2015   Procedure: Lower Extremity Angiography;  Surgeon: Runell Gess, MD;  Location: [facility] INVASIVE CV LAB;  Service: Cardiovascular;  Laterality: N/A;   PERIPHERAL VASCULAR CATHETERIZATION  08/09/2015   Procedure: Peripheral Vascular Intervention;  Surgeon: Runell Gess, MD;  Location: [facility] INVASIVE CV LAB;  Service: Cardiovascular;;  rt ext. iliac atherectomy and stent   PERIPHERAL VASCULAR CATHETERIZATION N/A 09/20/2015   Procedure: Lower Extremity Angiography;  Surgeon:  Subjective:    Patient ID: Aaron Sexton, male    DOB: 10/09/1945, 76 y.o.   MRN: [id number]  HPI Here with wife for Medicare wellness visit and follow up of chronic health conditions Reviewed advanced directives Reviewed other doctors---Dr Branch/Berry --cardiology, Dr Retta Mac surgeon, Dr Theressa Stamps, [facility] dentist, Dr Clydene Pugh, Dr Maryellen Pile Vascular surgery recently Vision is okay Hearing is good No tobacco or alcohol Fell once---slipped out back door. No injury No depression or anhedonia Starting to walk---and still working with PT Wife mostly does the housework/shopping No sig memory issues  Had vascular procedure in August--left iliac stent and common femoral endarterectomy. Has helped the leg pain and can walk better--but had hematoma and bleed with 2 subsequent procedures to stop the bleed and deal with wound. Now healing slowly with wound vac Foot ulcer is now just about healed Continuing on extended doxy Rx  No chest pain No SOB No palpitations No dizziness or syncope Some left leg swelling if down for a while. Better in AM  Continues on the chelation Rx  Current Outpatient Medications on File Prior to Visit  Medication Sig Dispense Refill   aspirin 81 MG tablet Take 81 mg by mouth every evening.     chlorthalidone (HYGROTON) 25 MG tablet TAKE 1/2 TABLET(12.5 MG) BY MOUTH DAILY (Patient taking differently: Take 12.5 mg by mouth daily.) 30 tablet 3   clopidogrel (PLAVIX) 75 MG tablet TAKE 1 TABLET(75 MG) BY MOUTH DAILY WITH BREAKFAST 30 tablet 9   Cyanocobalamin (VITAMIN B-12) 500 MCG SUBL Place 500 mcg under the tongue daily.     Deferasirox 180 MG TABS TAKE 4 TABLETS BY MOUTH DAILY 120 tablet 2   doxycycline (VIBRA-TABS) 100 MG tablet Take 1 tablet (100 mg total) by mouth every 12 (twelve) hours. 28 tablet 0   loperamide (IMODIUM A-D) 2 MG tablet Take 2 mg by mouth 4 (four) times daily as needed for diarrhea or loose stools.      losartan (COZAAR) 25 MG tablet TAKE ONE TABLET BY MOUTH EVERY DAY (Patient taking differently: Take 25 mg by mouth daily.) 90 tablet 3   metoprolol succinate (TOPROL-XL) 100 MG 24 hr tablet TAKE 1 TABLET(100 MG) BY MOUTH DAILY WITH OR IMMEDIATELY FOLLOWING A MEAL (Patient taking differently: Take 100 mg by mouth daily.) 90 tablet 3   nitroGLYCERIN (NITROSTAT) 0.4 MG SL tablet Place 1 tablet (0.4 mg total) under the tongue every 5 (five) minutes as needed for chest pain. 25 tablet 1   Omega-3 Fatty Acids (FISH OIL) 1000 MG CAPS Take 1,000 mg by mouth daily.     pravastatin (PRAVACHOL) 40 MG tablet TAKE 1 TABLET(40 MG) BY MOUTH DAILY 90 tablet 3   traMADol (ULTRAM) 50 MG tablet Take 1 tablet (50 mg total) by mouth every 8 (eight) hours as needed for moderate pain or severe pain. 20 tablet 0   Vitamin D, Ergocalciferol, (DRISDOL) 1.25 MG (50000 UNIT) CAPS capsule TAKE 1 CAPSULE BY MOUTH 1 TIME A WEEK 12 capsule 2   No current facility-administered medications on file prior to visit.    Allergies  Allergen Reactions   Cardizem [Diltiazem Hcl] Hives and Rash    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   Cerebrovascular disease    Moderate ASVD without focal stenosis in 01/2007   Colon polyps 07/2010   single 30m polyp--tubular adenoma   Diverticulosis  Subjective:    Patient ID: Aaron Sexton, male    DOB: 10/09/1945, 76 y.o.   MRN: [id number]  HPI Here with wife for Medicare wellness visit and follow up of chronic health conditions Reviewed advanced directives Reviewed other doctors---Dr Branch/Berry --cardiology, Dr Retta Mac surgeon, Dr Theressa Stamps, [facility] dentist, Dr Clydene Pugh, Dr Maryellen Pile Vascular surgery recently Vision is okay Hearing is good No tobacco or alcohol Fell once---slipped out back door. No injury No depression or anhedonia Starting to walk---and still working with PT Wife mostly does the housework/shopping No sig memory issues  Had vascular procedure in August--left iliac stent and common femoral endarterectomy. Has helped the leg pain and can walk better--but had hematoma and bleed with 2 subsequent procedures to stop the bleed and deal with wound. Now healing slowly with wound vac Foot ulcer is now just about healed Continuing on extended doxy Rx  No chest pain No SOB No palpitations No dizziness or syncope Some left leg swelling if down for a while. Better in AM  Continues on the chelation Rx  Current Outpatient Medications on File Prior to Visit  Medication Sig Dispense Refill   aspirin 81 MG tablet Take 81 mg by mouth every evening.     chlorthalidone (HYGROTON) 25 MG tablet TAKE 1/2 TABLET(12.5 MG) BY MOUTH DAILY (Patient taking differently: Take 12.5 mg by mouth daily.) 30 tablet 3   clopidogrel (PLAVIX) 75 MG tablet TAKE 1 TABLET(75 MG) BY MOUTH DAILY WITH BREAKFAST 30 tablet 9   Cyanocobalamin (VITAMIN B-12) 500 MCG SUBL Place 500 mcg under the tongue daily.     Deferasirox 180 MG TABS TAKE 4 TABLETS BY MOUTH DAILY 120 tablet 2   doxycycline (VIBRA-TABS) 100 MG tablet Take 1 tablet (100 mg total) by mouth every 12 (twelve) hours. 28 tablet 0   loperamide (IMODIUM A-D) 2 MG tablet Take 2 mg by mouth 4 (four) times daily as needed for diarrhea or loose stools.      losartan (COZAAR) 25 MG tablet TAKE ONE TABLET BY MOUTH EVERY DAY (Patient taking differently: Take 25 mg by mouth daily.) 90 tablet 3   metoprolol succinate (TOPROL-XL) 100 MG 24 hr tablet TAKE 1 TABLET(100 MG) BY MOUTH DAILY WITH OR IMMEDIATELY FOLLOWING A MEAL (Patient taking differently: Take 100 mg by mouth daily.) 90 tablet 3   nitroGLYCERIN (NITROSTAT) 0.4 MG SL tablet Place 1 tablet (0.4 mg total) under the tongue every 5 (five) minutes as needed for chest pain. 25 tablet 1   Omega-3 Fatty Acids (FISH OIL) 1000 MG CAPS Take 1,000 mg by mouth daily.     pravastatin (PRAVACHOL) 40 MG tablet TAKE 1 TABLET(40 MG) BY MOUTH DAILY 90 tablet 3   traMADol (ULTRAM) 50 MG tablet Take 1 tablet (50 mg total) by mouth every 8 (eight) hours as needed for moderate pain or severe pain. 20 tablet 0   Vitamin D, Ergocalciferol, (DRISDOL) 1.25 MG (50000 UNIT) CAPS capsule TAKE 1 CAPSULE BY MOUTH 1 TIME A WEEK 12 capsule 2   No current facility-administered medications on file prior to visit.    Allergies  Allergen Reactions   Cardizem [Diltiazem Hcl] Hives and Rash    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   Cerebrovascular disease    Moderate ASVD without focal stenosis in 01/2007   Colon polyps 07/2010   single 30m polyp--tubular adenoma   Diverticulosis

## 2022-02-09 NOTE — Assessment & Plan Note (Signed)
I have personally reviewed the Medicare Annual Wellness questionnaire and have noted 1. The patient's medical and social history 2. Their use of alcohol, tobacco or illicit drugs 3. Their current medications and supplements 4. The patient's functional ability including ADL's, fall risks, home safety risks and hearing or visual             impairment. 5. Diet and physical activities 6. Evidence for depression or mood disorders  The patients weight, height, BMI and visual acuity have been recorded in the chart I have made referrals, counseling and provided education to the patient based review of the above and I have provided the pt with a written personalized care plan for preventive services.  I have provided you with a copy of your personalized plan for preventive services. Please take the time to review along with your updated medication list.  Done with cancer screening Trying to get back to more exercise Flu vaccine today Updated COVID at pharmacy--along with Td Consider the shingrix at pharmacy

## 2022-02-09 NOTE — Assessment & Plan Note (Signed)
Recent procedure on left and femorals Wound healed and pain has improved Dealing with the post op bleeding---- Is on palvix and pravastatin 40

## 2022-02-09 NOTE — Assessment & Plan Note (Signed)
On the desferasirox

## 2022-02-09 NOTE — Addendum Note (Signed)
Addended by: Pilar Grammes on: 02/09/2022 03:51 PM   Modules accepted: Orders

## 2022-02-09 NOTE — Progress Notes (Signed)
Hearing Screening - Comments:: Passed whisper test Vision Screening - Comments:: December 2022  

## 2022-02-09 NOTE — Assessment & Plan Note (Signed)
BP Readings from Last 3 Encounters:  02/09/22 134/64  01/31/22 129/71  01/12/22 (!) 148/65   Good control on chlorthalidone 25, losartan 25

## 2022-02-09 NOTE — Assessment & Plan Note (Signed)
No CVA symptoms On plavix, statin

## 2022-02-14 ENCOUNTER — Ambulatory Visit (INDEPENDENT_AMBULATORY_CARE_PROVIDER_SITE_OTHER): Payer: Medicare PPO | Admitting: Vascular Surgery

## 2022-02-14 VITALS — BP 128/65 | HR 84 | Temp 98.5°F | Resp 16 | Ht 67.0 in | Wt 192.0 lb

## 2022-02-14 DIAGNOSIS — I70222 Atherosclerosis of native arteries of extremities with rest pain, left leg: Secondary | ICD-10-CM

## 2022-02-14 NOTE — Progress Notes (Signed)
Patient name: Aaron Sexton MRN: [id number] DOB: 08/27/1945 Sex: male  REASON FOR VISIT: Postop check  HPI: Aaron Sexton is a 76 y.o. male with multiple medical comorbidities who presents for postop check.  He underwent a left common femoral endarterectomy with a left common femoral to above-knee popliteal bypass with PTFE including left iliac artery stenting on 12/09/2021 for CLI with tissue loss.  He ultimately required washout of a hematoma on 12/17/2021.  He then had a breakdown of his left groin incision requiring I&D with myriad and a VAC on 01/08/2022.  His cultures grew Staph epidermidis.  His graft was not exposed.  Left groin VAC in place and wound is healing.  Remains on doxycycline.  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   Cerebrovascular disease    Moderate ASVD without focal stenosis in 01/2007   Colon polyps 07/2010   single 2mm polyp--tubular adenoma   Diverticulosis 2012   found on colonoscopy   Erectile dysfunction    Family history of adverse reaction to anesthesia    difficult for son & sistor to wake    GERD (gastroesophageal reflux disease)    Hematochezia 05/24/2010   Hyperlipidemia    Hypertension    Osteoarthritis    knees/hands-Dr Sherlean Foot   Overweight(278.02)    Peripheral vascular disease (HCC) 03/11/2010   Moderate SFA stenosis; history of claudication   Pneumonia    Rosacea    Tobacco abuse, in remission    30-40 pack years discontinued in 1993   Vitamin B12 deficiency     Past Surgical History:  Procedure Laterality Date   APPENDECTOMY     APPLICATION OF WOUND VAC Left 12/17/2021   Procedure: APPLICATION OF PREVENA WOUND VAC LEFT GROIN;  Surgeon: Victorino Sparrow, MD;  Location: [facility] OR;  Service: Vascular;  Laterality: Left;   APPLICATION OF WOUND VAC Left 01/08/2022   Procedure: APPLICATION OF WOUND VAC left groin.;  Surgeon: Cephus Shelling, MD;  Location: [facility] OR;  Service: Vascular;  Laterality: Left;   COLONOSCOPY W/ POLYPECTOMY  2012   ENDARTERECTOMY FEMORAL Left 12/09/2021   Procedure: LEFT COMMON FEMORAL ENDARTERECTOMY WITH 1 CM X 6 CM XENOSURE BOVINE PATCH ANGIOPLASTY;  Surgeon: Cephus Shelling, MD;  Location: [facility] OR;  Service: Vascular;  Laterality: Left;   FEMORAL-POPLITEAL BYPASS GRAFT Left 12/09/2021   Procedure: LEFT FEMORAL- ABOVE KNEE POPLITEAL BYPASS WITH 6 mm x 80 cm PROPATEN GRAFT;  Surgeon: Cephus Shelling, MD;  Location: [facility] OR;  Service: Vascular;  Laterality: Left;  INSERT ARTERIAL LINE   ILIAC VEIN ANGIOPLASTY / STENTING Right 08/09/2015   INCISION AND DRAINAGE OF WOUND Left 01/08/2022   Procedure: IRRIGATION AND DEBRIDEMENT GROIN LEFT application of Myriad Morcells.;  Surgeon: Cephus Shelling, MD;  Location: [facility] OR;  Service: Vascular;  Laterality: Left;   INSERTION OF ILIAC STENT  12/09/2021   Procedure: INSERTION OF LEFT COMMON AND EXTERNAL  ILIAC STENTS WITH ANGIOPLASTY;  Surgeon: Cephus Shelling, MD;  Location: [facility] OR;  Service: Vascular;;   LOWER EXTREMITY ANGIOGRAM Left 12/09/2021   Procedure: LEFT ILIAC AND LOWER EXTREMITY ANGIOGRAM;  Surgeon: Cephus Shelling, MD;  Location: [facility] OR;  Service: Vascular;  Laterality: Left;   LOWER EXTREMITY ANGIOGRAPHY Bilateral 11/03/2021   Procedure: Lower Extremity Angiography;  Surgeon: Runell Gess, MD;  Location: [facility] INVASIVE CV LAB;  Service: Cardiovascular;  Laterality: Bilateral;  Limited Study   PERIPHERAL VASCULAR CATHETERIZATION Bilateral 06/21/2015   Procedure: Lower Extremity Angiography;  Surgeon: Runell Gess, MD;  Location: [facility] INVASIVE CV LAB;  Service: Cardiovascular;  Laterality: Bilateral;   PERIPHERAL VASCULAR CATHETERIZATION N/A 06/21/2015   Procedure: Abdominal Aortogram;  Surgeon: Runell Gess, MD;  Location: [facility] INVASIVE CV LAB;  Service: Cardiovascular;  Laterality: N/A;   PERIPHERAL VASCULAR CATHETERIZATION N/A  07/26/2015   Procedure: Lower Extremity Angiography;  Surgeon: Runell Gess, MD;  Location: [facility] INVASIVE CV LAB;  Service: Cardiovascular;  Laterality: N/A;   PERIPHERAL VASCULAR CATHETERIZATION N/A 08/09/2015   Procedure: Lower Extremity Angiography;  Surgeon: Runell Gess, MD;  Location: [facility] INVASIVE CV LAB;  Service: Cardiovascular;  Laterality: N/A;   PERIPHERAL VASCULAR CATHETERIZATION  08/09/2015   Procedure: Peripheral Vascular Intervention;  Surgeon: Runell Gess, MD;  Location: [facility] INVASIVE CV LAB;  Service: Cardiovascular;;  rt ext. iliac atherectomy and stent   PERIPHERAL VASCULAR CATHETERIZATION N/A 09/20/2015   Procedure: Lower Extremity Angiography;  Surgeon: Runell Gess, MD;  Location: [facility] INVASIVE CV LAB;  Service: Cardiovascular;  Laterality: N/A;   PERIPHERAL VASCULAR CATHETERIZATION Right 09/20/2015   Procedure: Peripheral Vascular Intervention;  Surgeon: Runell Gess, MD;  Location: [facility] INVASIVE CV LAB;  Service: Cardiovascular;  Laterality: Right;  SFA   ROTATOR CUFF REPAIR Left 7/15   Dr Sherlean Foot   WOUND EXPLORATION Left 12/17/2021   Procedure: LEFT LEG WOUND EXPLORATION AND WASH OUT WITH MYRIAD MORCELLS;  Surgeon: Victorino Sparrow, MD;  Location: [facility] OR;  Service: Vascular;  Laterality: Left;    Family History  Problem Relation Age of Onset   Hypertension Father        And siblings   Heart disease Father        And second-degree relatives   Transient ischemic attack Father    Lung cancer Mother    Diabetes Mother    Brain cancer Sister    Diabetes Brother    Atrial fibrillation Brother    Heart disease Sister    Heart disease Sister    Thyroid disease Sister    Ulcers Son    Diabetes Son    Diabetes Son     SOCIAL HISTORY: Social History   Tobacco Use   Smoking status: Former    Packs/day: 1.00    Years: 30.00    Total pack years: 30.00    Types: Cigarettes    Quit date: 05/16/1990    Years since quitting: 31.7    Passive exposure: Never    Smokeless tobacco: Never   Tobacco comments:    Quit in 1993  Substance Use Topics   Alcohol use: No    Alcohol/week: 0.0 standard drinks of alcohol    Allergies  Allergen Reactions   Cardizem [Diltiazem Hcl] Hives and Rash    Current Outpatient Medications  Medication Sig Dispense Refill   aspirin 81 MG tablet Take 81 mg by mouth every evening.     chlorthalidone (HYGROTON) 25 MG tablet TAKE 1/2 TABLET(12.5 MG) BY MOUTH DAILY (Patient taking differently: Take 12.5 mg by mouth daily.) 30 tablet 3   clopidogrel (PLAVIX) 75 MG tablet TAKE 1 TABLET(75 MG) BY MOUTH DAILY WITH BREAKFAST 30 tablet 9   Cyanocobalamin (VITAMIN B-12) 500 MCG SUBL Place 500 mcg under the tongue daily.     Deferasirox 180 MG TABS TAKE 4 TABLETS BY MOUTH DAILY 120 tablet 2   doxycycline (VIBRA-TABS) 100 MG tablet Take 1 tablet (  100 mg total) by mouth every 12 (twelve) hours. 28 tablet 0   loperamide (IMODIUM A-D) 2 MG tablet Take 2 mg by mouth 4 (four) times daily as needed for diarrhea or loose stools.     losartan (COZAAR) 25 MG tablet Take 1 tablet (25 mg total) by mouth daily. 90 tablet 3   metoprolol succinate (TOPROL-XL) 100 MG 24 hr tablet TAKE 1 TABLET(100 MG) BY MOUTH DAILY WITH OR IMMEDIATELY FOLLOWING A MEAL (Patient taking differently: Take 100 mg by mouth daily.) 90 tablet 3   nitroGLYCERIN (NITROSTAT) 0.4 MG SL tablet Place 1 tablet (0.4 mg total) under the tongue every 5 (five) minutes as needed for chest pain. 25 tablet 1   Omega-3 Fatty Acids (FISH OIL) 1000 MG CAPS Take 1,000 mg by mouth daily.     pravastatin (PRAVACHOL) 40 MG tablet TAKE 1 TABLET(40 MG) BY MOUTH DAILY 90 tablet 3   traMADol (ULTRAM) 50 MG tablet Take 1 tablet (50 mg total) by mouth every 8 (eight) hours as needed for moderate pain or severe pain. 20 tablet 0   Vitamin D, Ergocalciferol, (DRISDOL) 1.25 MG (50000 UNIT) CAPS capsule TAKE 1 CAPSULE BY MOUTH 1 TIME A WEEK 12 capsule 2   No current facility-administered medications  for this visit.    REVIEW OF SYSTEMS:  [X]  denotes positive finding, [ ]  denotes negative finding Cardiac  Comments:  Chest pain or chest pressure:    Shortness of breath upon exertion:    Short of breath when lying flat:    Irregular heart rhythm:        Vascular    Pain in calf, thigh, or hip brought on by ambulation:    Pain in feet at night that wakes you up from your sleep:     Blood clot in your veins:    Leg swelling:         Pulmonary    Oxygen at home:    Productive cough:     Wheezing:         Neurologic    Sudden weakness in arms or legs:     Sudden numbness in arms or legs:     Sudden onset of difficulty speaking or slurred speech:    Temporary loss of vision in one eye:     Problems with dizziness:         Gastrointestinal    Blood in stool:     Vomited blood:         Genitourinary    Burning when urinating:     Blood in urine:        Psychiatric    Major depression:         Hematologic    Bleeding problems:    Problems with blood clotting too easily:        Skin    Rashes or ulcers:        Constitutional    Fever or chills:      PHYSICAL EXAM: Vitals:   02/14/22 1616  BP: 128/65  Pulse: 84  Resp: 16  Temp: 98.5 F (36.9 C)  TempSrc: Temporal  SpO2: 95%  Weight: 192 lb (87.1 kg)  Height: 5\' 7"  (1.702 m)    GENERAL: The patient is a well-nourished male, in no acute distress. The vital signs are documented above. CARDIAC: There is a regular rate and rhythm.  VASCULAR:  Left groin wound with good granulation tissue as pictured Left AK pop incision healed Left DP palpable  DATA:   None  Assessment/Plan:  76 y.o. male with multiple medical comorbidities who presents for postop check.  He underwent a left common femoral endarterectomy with a left common femoral to above-knee popliteal bypass with PTFE including left iliac artery stenting on 12/09/2021 for CLI with tissue loss.  He ultimately required washout of a hematoma  on 12/17/2021.  He then had a breakdown of his left groin incision requiring I&D with myriad and a VAC on 01/08/2022.  His cultures grew Staph epidermidis.  There was no obvious graft infection.  The deep tissue was incorporated around the graft.  His groin is healing and looks excellent as pictured above.  Discussed with home health that they can discontinue his VAC dressing.  They are going to do a silver alginate dressing.  Discussed he can shower.  I will see him in 1 month for final wound check and then we will pursue surveillance.  He will complete another 1 month of doxycycline as previously prescribed.   Cephus Shelling, MD [facility] Office: [phone]

## 2022-02-16 ENCOUNTER — Other Ambulatory Visit (HOSPITAL_COMMUNITY): Payer: Self-pay

## 2022-03-09 ENCOUNTER — Other Ambulatory Visit (HOSPITAL_COMMUNITY): Payer: Self-pay

## 2022-03-15 ENCOUNTER — Other Ambulatory Visit (HOSPITAL_COMMUNITY): Payer: Self-pay

## 2022-03-21 ENCOUNTER — Ambulatory Visit (INDEPENDENT_AMBULATORY_CARE_PROVIDER_SITE_OTHER): Payer: Medicare PPO | Admitting: Vascular Surgery

## 2022-03-21 ENCOUNTER — Encounter: Payer: Self-pay | Admitting: Vascular Surgery

## 2022-03-21 VITALS — BP 136/64 | HR 75 | Temp 98.7°F | Resp 14 | Ht 67.0 in | Wt 193.0 lb

## 2022-03-21 DIAGNOSIS — I70222 Atherosclerosis of native arteries of extremities with rest pain, left leg: Secondary | ICD-10-CM

## 2022-03-21 NOTE — Progress Notes (Signed)
Patient name: Aaron Sexton MRN: [id number] DOB: 1945-07-28 Sex: male  REASON FOR VISIT: Ongoing postop check  HPI: Aaron Sexton is a 76 y.o. male with multiple medical comorbidities who presents for ongoing postop check at one month interval follow-up.  He underwent a left common femoral endarterectomy with a left common femoral to above-knee popliteal bypass with PTFE including left iliac artery stenting on 12/09/2021 for CLI with tissue loss.  He ultimately required washout of a hematoma on 12/17/2021.  He then had a breakdown of his left groin incision requiring I&D with myriad and a VAC on 01/08/2022.  His cultures grew Staph epidermidis.  His graft was not exposed.    He has completed the [facility] dressing to his left groin.  This is now fully healed.  He has completed his doxycycline.  No complaints today.  Back to driving.  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   Cerebrovascular disease    Moderate ASVD without focal stenosis in 01/2007   Colon polyps 07/2010   single 2mm polyp--tubular adenoma   Diverticulosis 2012   found on colonoscopy   Erectile dysfunction    Family history of adverse reaction to anesthesia    difficult for son & sistor to wake    GERD (gastroesophageal reflux disease)    Hematochezia 05/24/2010   Hyperlipidemia    Hypertension    Osteoarthritis    knees/hands-Dr Sherlean Foot   Overweight(278.02)    Peripheral vascular disease (HCC) 03/11/2010   Moderate SFA stenosis; history of claudication   Pneumonia    Rosacea    Tobacco abuse, in remission    30-40 pack years discontinued in 1993   Vitamin B12 deficiency     Past Surgical History:  Procedure Laterality Date   APPENDECTOMY     APPLICATION OF WOUND VAC Left 12/17/2021   Procedure: APPLICATION OF PREVENA WOUND VAC LEFT GROIN;  Surgeon: Victorino Sparrow, MD;  Location: [facility] OR;  Service:  Vascular;  Laterality: Left;   APPLICATION OF WOUND VAC Left 01/08/2022   Procedure: APPLICATION OF WOUND VAC left groin.;  Surgeon: Cephus Shelling, MD;  Location: [facility] OR;  Service: Vascular;  Laterality: Left;   COLONOSCOPY W/ POLYPECTOMY  2012   ENDARTERECTOMY FEMORAL Left 12/09/2021   Procedure: LEFT COMMON FEMORAL ENDARTERECTOMY WITH 1 CM X 6 CM XENOSURE BOVINE PATCH ANGIOPLASTY;  Surgeon: Cephus Shelling, MD;  Location: [facility] OR;  Service: Vascular;  Laterality: Left;   FEMORAL-POPLITEAL BYPASS GRAFT Left 12/09/2021   Procedure: LEFT FEMORAL- ABOVE KNEE POPLITEAL BYPASS WITH 6 mm x 80 cm PROPATEN GRAFT;  Surgeon: Cephus Shelling, MD;  Location: [facility] OR;  Service: Vascular;  Laterality: Left;  INSERT ARTERIAL LINE   ILIAC VEIN ANGIOPLASTY / STENTING Right 08/09/2015   INCISION AND DRAINAGE OF WOUND Left 01/08/2022   Procedure: IRRIGATION AND DEBRIDEMENT GROIN LEFT application of Myriad Morcells.;  Surgeon: Cephus Shelling, MD;  Location: [facility] OR;  Service: Vascular;  Laterality: Left;   INSERTION OF ILIAC STENT  12/09/2021   Procedure: INSERTION OF LEFT COMMON AND EXTERNAL  ILIAC STENTS WITH ANGIOPLASTY;  Surgeon: Cephus Shelling, MD;  Location: [facility] OR;  Service: Vascular;;   LOWER EXTREMITY ANGIOGRAM Left 12/09/2021   Procedure: LEFT ILIAC AND LOWER EXTREMITY ANGIOGRAM;  Surgeon: Cephus Shelling, MD;  Location: [facility] OR;  Service: Vascular;  Laterality: Left;  LOWER EXTREMITY ANGIOGRAPHY Bilateral 11/03/2021   Procedure: Lower Extremity Angiography;  Surgeon: Runell Gess, MD;  Location: [facility] INVASIVE CV LAB;  Service: Cardiovascular;  Laterality: Bilateral;  Limited Study   PERIPHERAL VASCULAR CATHETERIZATION Bilateral 06/21/2015   Procedure: Lower Extremity Angiography;  Surgeon: Runell Gess, MD;  Location: [facility] INVASIVE CV LAB;  Service: Cardiovascular;  Laterality: Bilateral;   PERIPHERAL VASCULAR CATHETERIZATION N/A 06/21/2015   Procedure: Abdominal Aortogram;  Surgeon:  Runell Gess, MD;  Location: [facility] INVASIVE CV LAB;  Service: Cardiovascular;  Laterality: N/A;   PERIPHERAL VASCULAR CATHETERIZATION N/A 07/26/2015   Procedure: Lower Extremity Angiography;  Surgeon: Runell Gess, MD;  Location: [facility] INVASIVE CV LAB;  Service: Cardiovascular;  Laterality: N/A;   PERIPHERAL VASCULAR CATHETERIZATION N/A 08/09/2015   Procedure: Lower Extremity Angiography;  Surgeon: Runell Gess, MD;  Location: [facility] INVASIVE CV LAB;  Service: Cardiovascular;  Laterality: N/A;   PERIPHERAL VASCULAR CATHETERIZATION  08/09/2015   Procedure: Peripheral Vascular Intervention;  Surgeon: Runell Gess, MD;  Location: [facility] INVASIVE CV LAB;  Service: Cardiovascular;;  rt ext. iliac atherectomy and stent   PERIPHERAL VASCULAR CATHETERIZATION N/A 09/20/2015   Procedure: Lower Extremity Angiography;  Surgeon: Runell Gess, MD;  Location: [facility] INVASIVE CV LAB;  Service: Cardiovascular;  Laterality: N/A;   PERIPHERAL VASCULAR CATHETERIZATION Right 09/20/2015   Procedure: Peripheral Vascular Intervention;  Surgeon: Runell Gess, MD;  Location: [facility] INVASIVE CV LAB;  Service: Cardiovascular;  Laterality: Right;  SFA   ROTATOR CUFF REPAIR Left 7/15   Dr Sherlean Foot   WOUND EXPLORATION Left 12/17/2021   Procedure: LEFT LEG WOUND EXPLORATION AND WASH OUT WITH MYRIAD MORCELLS;  Surgeon: Victorino Sparrow, MD;  Location: [facility] OR;  Service: Vascular;  Laterality: Left;    Family History  Problem Relation Age of Onset   Hypertension Father        And siblings   Heart disease Father        And second-degree relatives   Transient ischemic attack Father    Lung cancer Mother    Diabetes Mother    Brain cancer Sister    Diabetes Brother    Atrial fibrillation Brother    Heart disease Sister    Heart disease Sister    Thyroid disease Sister    Ulcers Son    Diabetes Son    Diabetes Son     SOCIAL HISTORY: Social History   Tobacco Use   Smoking status: Former    Packs/day: 1.00    Years: 30.00     Total pack years: 30.00    Types: Cigarettes    Quit date: 05/16/1990    Years since quitting: 31.8    Passive exposure: Never   Smokeless tobacco: Never   Tobacco comments:    Quit in 1993  Substance Use Topics   Alcohol use: No    Alcohol/week: 0.0 standard drinks of alcohol    Allergies  Allergen Reactions   Cardizem [Diltiazem Hcl] Hives and Rash    Current Outpatient Medications  Medication Sig Dispense Refill   aspirin 81 MG tablet Take 81 mg by mouth every evening.     chlorthalidone (HYGROTON) 25 MG tablet TAKE 1/2 TABLET(12.5 MG) BY MOUTH DAILY (Patient taking differently: Take 12.5 mg by mouth daily.) 30 tablet 3   clopidogrel (PLAVIX) 75 MG tablet TAKE 1 TABLET(75 MG) BY MOUTH DAILY WITH BREAKFAST 30 tablet 9   Cyanocobalamin (VITAMIN B-12) 500 MCG SUBL Place 500 mcg under the tongue daily.  Deferasirox 180 MG TABS TAKE 4 TABLETS BY MOUTH DAILY 120 tablet 2   loperamide (IMODIUM A-D) 2 MG tablet Take 2 mg by mouth 4 (four) times daily as needed for diarrhea or loose stools.     losartan (COZAAR) 25 MG tablet Take 1 tablet (25 mg total) by mouth daily. 90 tablet 3   metoprolol succinate (TOPROL-XL) 100 MG 24 hr tablet TAKE 1 TABLET(100 MG) BY MOUTH DAILY WITH OR IMMEDIATELY FOLLOWING A MEAL (Patient taking differently: Take 100 mg by mouth daily.) 90 tablet 3   nitroGLYCERIN (NITROSTAT) 0.4 MG SL tablet Place 1 tablet (0.4 mg total) under the tongue every 5 (five) minutes as needed for chest pain. 25 tablet 1   Omega-3 Fatty Acids (FISH OIL) 1000 MG CAPS Take 1,000 mg by mouth daily.     pravastatin (PRAVACHOL) 40 MG tablet TAKE 1 TABLET(40 MG) BY MOUTH DAILY 90 tablet 3   traMADol (ULTRAM) 50 MG tablet Take 1 tablet (50 mg total) by mouth every 8 (eight) hours as needed for moderate pain or severe pain. 20 tablet 0   Vitamin D, Ergocalciferol, (DRISDOL) 1.25 MG (50000 UNIT) CAPS capsule TAKE 1 CAPSULE BY MOUTH 1 TIME A WEEK 12 capsule 2   doxycycline (VIBRA-TABS) 100  MG tablet Take 1 tablet (100 mg total) by mouth every 12 (twelve) hours. (Patient not taking: Reported on 03/21/2022) 28 tablet 0   No current facility-administered medications for this visit.    REVIEW OF SYSTEMS:  [X]  denotes positive finding, [ ]  denotes negative finding Cardiac  Comments:  Chest pain or chest pressure:    Shortness of breath upon exertion:    Short of breath when lying flat:    Irregular heart rhythm:        Vascular    Pain in calf, thigh, or hip brought on by ambulation:    Pain in feet at night that wakes you up from your sleep:     Blood clot in your veins:    Leg swelling:         Pulmonary    Oxygen at home:    Productive cough:     Wheezing:         Neurologic    Sudden weakness in arms or legs:     Sudden numbness in arms or legs:     Sudden onset of difficulty speaking or slurred speech:    Temporary loss of vision in one eye:     Problems with dizziness:         Gastrointestinal    Blood in stool:     Vomited blood:         Genitourinary    Burning when urinating:     Blood in urine:        Psychiatric    Major depression:         Hematologic    Bleeding problems:    Problems with blood clotting too easily:        Skin    Rashes or ulcers:        Constitutional    Fever or chills:      PHYSICAL EXAM: Vitals:   03/21/22 1555  BP: 136/64  Pulse: 75  Resp: 14  Temp: 98.7 F (37.1 C)  TempSrc: Temporal  SpO2: 98%  Weight: 193 lb (87.5 kg)  Height: 5\' 7"  (1.702 m)    GENERAL: The patient is a well-nourished male, in no acute distress. The vital signs are documented  above. CARDIAC: There is a regular rate and rhythm.  VASCULAR:  Left groin now healed Left above-knee popliteal incision healed Ulcer between the first and second toe of the left foot healed Left femoral pulse palpable Left DP pulse palpable   DATA:   None  Assessment/Plan:  76 y.o. male with multiple medical comorbidities who presents for ongoing  postop check.  He underwent a left common femoral endarterectomy with a left common femoral to above-knee popliteal bypass with PTFE including left iliac artery stenting on 12/09/2021 for CLI with tissue loss.  He ultimately required washout of a hematoma on 12/17/2021.  He then had a breakdown of his left groin incision requiring I&D with myriad and a VAC on 01/08/2022.  His cultures grew Staph epidermidis.  There was no obvious graft infection.  The deep tissue was incorporated around the graft.  His left groin is now completely healed.  He is requiring no additional wound care.  He completed his doxycycline for 6 weeks postop as I was very aggressive treating him even though I did not feel the graft was infected.  Discussed I will see him in 3 months for surveillance with left iliac duplex and left lower extremity bypass duplex.  He knows to call with questions or concerns.   Cephus Shelling, MD [facility] [place] Office: [phone]

## 2022-04-03 ENCOUNTER — Other Ambulatory Visit: Payer: Medicare PPO

## 2022-04-03 ENCOUNTER — Ambulatory Visit: Payer: Medicare PPO | Admitting: Hematology

## 2022-04-05 ENCOUNTER — Inpatient Hospital Stay: Payer: Medicare PPO | Attending: Hematology

## 2022-04-05 ENCOUNTER — Inpatient Hospital Stay: Payer: Medicare PPO | Admitting: Hematology

## 2022-04-05 DIAGNOSIS — I10 Essential (primary) hypertension: Secondary | ICD-10-CM | POA: Diagnosis not present

## 2022-04-05 DIAGNOSIS — Z87891 Personal history of nicotine dependence: Secondary | ICD-10-CM | POA: Diagnosis not present

## 2022-04-05 DIAGNOSIS — D539 Nutritional anemia, unspecified: Secondary | ICD-10-CM | POA: Insufficient documentation

## 2022-04-05 LAB — CBC WITH DIFFERENTIAL/PLATELET
Abs Immature Granulocytes: 0.02 10*3/uL (ref 0.00–0.07)
Basophils Absolute: 0.1 10*3/uL (ref 0.0–0.1)
Basophils Relative: 1 %
Eosinophils Absolute: 0.2 10*3/uL (ref 0.0–0.5)
Eosinophils Relative: 3 %
HCT: 27.1 % — ABNORMAL LOW (ref 39.0–52.0)
Hemoglobin: 9 g/dL — ABNORMAL LOW (ref 13.0–17.0)
Immature Granulocytes: 0 %
Lymphocytes Relative: 36 %
Lymphs Abs: 3.1 10*3/uL (ref 0.7–4.0)
MCH: 39 pg — ABNORMAL HIGH (ref 26.0–34.0)
MCHC: 33.2 g/dL (ref 30.0–36.0)
MCV: 117.3 fL — ABNORMAL HIGH (ref 80.0–100.0)
Monocytes Absolute: 0.9 10*3/uL (ref 0.1–1.0)
Monocytes Relative: 11 %
Neutro Abs: 4.3 10*3/uL (ref 1.7–7.7)
Neutrophils Relative %: 49 %
Platelets: 334 10*3/uL (ref 150–400)
RBC: 2.31 MIL/uL — ABNORMAL LOW (ref 4.22–5.81)
RDW: 15.4 % (ref 11.5–15.5)
WBC: 8.6 10*3/uL (ref 4.0–10.5)
nRBC: 0.2 % (ref 0.0–0.2)

## 2022-04-05 LAB — COMPREHENSIVE METABOLIC PANEL
ALT: 13 U/L (ref 0–44)
AST: 20 U/L (ref 15–41)
Albumin: 3.9 g/dL (ref 3.5–5.0)
Alkaline Phosphatase: 47 U/L (ref 38–126)
Anion gap: 11 (ref 5–15)
BUN: 27 mg/dL — ABNORMAL HIGH (ref 8–23)
CO2: 23 mmol/L (ref 22–32)
Calcium: 8.9 mg/dL (ref 8.9–10.3)
Chloride: 104 mmol/L (ref 98–111)
Creatinine, Ser: 1.13 mg/dL (ref 0.61–1.24)
GFR, Estimated: >60 mL/min (ref >60)
Glucose, Bld: 96 mg/dL (ref 70–99)
Potassium: 4 mmol/L (ref 3.5–5.1)
Sodium: 138 mmol/L (ref 135–145)
Total Bilirubin: 0.4 mg/dL (ref 0.3–1.2)
Total Protein: 7.7 g/dL (ref 6.5–8.1)

## 2022-04-05 LAB — IRON AND TIBC
Iron: 135 ug/dL (ref 45–182)
Saturation Ratios: 44 % — ABNORMAL HIGH (ref 17.9–39.5)
TIBC: 304 ug/dL (ref 250–450)
UIBC: 169 ug/dL

## 2022-04-05 LAB — FERRITIN: Ferritin: 46 ng/mL (ref 24–336)

## 2022-04-05 NOTE — Progress Notes (Signed)
[facility] [place], [place] [address]   CLINIC:  Medical Oncology/Hematology  PCP:  Venia Carbon, MD [place] / [place] [address]  [phone]  REASON FOR VISIT:  Follow-up for hereditary hemochromatosis  PRIOR THERAPY: none  CURRENT THERAPY: Deferasirox 720 mg daily  INTERVAL HISTORY:  Aaron Sexton, a 76 y.o. male, seen for follow-up of hereditary hemochromatosis.  He had about 3 hospitalizations in the last few months.  He has required 11 units of PRBC transfusions.  He reports that he is taking 4 tablets of Jadenu daily without any missing doses.  Denies any GI symptoms over the occasional diarrhea.  REVIEW OF SYSTEMS:  Review of Systems  Constitutional:  Negative for appetite change and fatigue.  Gastrointestinal:  Positive for diarrhea. Negative for nausea and vomiting.  All other systems reviewed and are negative.   PAST MEDICAL/SURGICAL HISTORY:  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   Cerebrovascular disease    Moderate ASVD without focal stenosis in 01/2007   Colon polyps 07/2010   single 81m polyp--tubular adenoma   Diverticulosis 2012   found on colonoscopy   Erectile dysfunction    Family history of adverse reaction to anesthesia    difficult for son & sistor to wake    GERD (gastroesophageal reflux disease)    Hematochezia 05/24/2010   Hyperlipidemia    Hypertension    Osteoarthritis    knees/hands-Dr LRonnie Derby  Overweight(278.02)    Peripheral vascular disease ([facility] 03/11/2010   Moderate SFA stenosis; history of claudication   Pneumonia    Rosacea    Tobacco abuse, in remission    30-40 pack years discontinued in 1993   Vitamin B12 deficiency    Past Surgical History:  Procedure Laterality Date   APPENDECTOMY     APPLICATION OF WOUND VAC Left 12/17/2021   Procedure:  APPLICATION OF PREVENA WOUND VAC LEFT GROIN;  Surgeon: RBroadus John MD;  [facility]  Service: Vascular;  Laterality: Left;   APPLICATION OF WOUND VAC Left 01/08/2022   Procedure: APPLICATION OF WOUND VAC left groin.;  Surgeon: CMarty Heck MD;  [facility] [facility]  Service: Vascular;  Laterality: Left;   COLONOSCOPY W/ POLYPECTOMY  2012   ENDARTERECTOMY FEMORAL Left 12/09/2021   Procedure: LEFT COMMON FEMORAL ENDARTERECTOMY WITH 1 CM X 6 CM X[place]atur  Surgeon: CMarty Heck MD;  [place] [facility]  Service: Vascular;  Laterality: Left;   FEMORAL-POPLITEAL BYPASS GRAFT Left 12/09/2021   Procedure: LEFT FEMORAL- ABOVE KNEE POPLITEAL BYPASS WITH 6 mm x 80 cm PROPATEN GRAFT;  Surgeon: CMarty Heck MD;  [facility]  Service: Vascular;  Laterality: Left;  INSERT ARTERIAL LINE   ILIAC VEIN ANGIOPLASTY / STENTING Right 08/09/2015   INCISION AND DRAINAGE OF WOUND Left 01/08/2022   Procedure: IRRIGATION AND DEBRIDEMENT GROIN LEFT application of Myriad Morcells.;  Surgeon: CMarty Heck MD;  [place] [facility]  Service: Vascular;  Laterality: Left;   INSERTION OF ILIAC STENT  12/09/2021   Procedure: INSERTION OF LEFT COMMON AND EXTERNAL  ILIAC STENTS WITH ANGIOPLASTY;  Surgeon: CMarty Heck MD;  [place]  Service: Vascular;;   LOWER EXTREMITY ANGIOGRAM Left 12/09/2021   Procedure: LEFT ILIAC AND LOWER EXTREMITY ANGIOGRAM;  Surgeon: CMarty Heck MD;  [facility];  Service: Vascular;  Laterality: Left;   LOWER EXTREMITY ANGIOGRAPHY Bilateral 11/03/2021   Procedure: Lower Extremity Angiography;  Surgeon: Lorretta Harp, MD;  Location: [place] [facility];  Service: Cardiovascular;  Laterality: Bilateral;  Limited Study   PERIPHERAL VASCULAR CATHETERIZATION Bilateral 06/21/2015   Procedure: Lower Extremity Angiography;  Surgeon: Lorretta Harp, MD;  Location: [place] [facility];  Service: Cardiovascular;  Laterality:  Bilateral;   PERIPHERAL VASCULAR CATHETERIZATION N/A 06/21/2015   Procedure: Abdominal Aortogram;  Surgeon: Lorretta Harp, MD;  Location: [place] [facility];  Service: Cardiovascular;  Laterality: N/A;   PERIPHERAL VASCULAR CATHETERIZATION N/A 07/26/2015   Procedure: Lower Extremity Angiography;  Surgeon: Lorretta Harp, MD;  Location: [place] [facility];  Service: Cardiovascular;  Laterality: N/A;   PERIPHERAL VASCULAR CATHETERIZATION N/A 08/09/2015   Procedure: Lower Extremity Angiography;  Surgeon: Lorretta Harp, MD;  Location: [place] [facility] LAB;  Service: Cardiovascular;  Laterality: N/A;   PERIPHERAL VASCULAR CATHETERIZATION  08/09/2015   Procedure: Peripheral Vascular Intervention;  Surgeon: Lorretta Harp, MD;  Location: [place] [facility] LAB;  Service: Cardiovascular;;  rt ext. iliac atherectomy and stent   PERIPHERAL VASCULAR CATHETERIZATION N/A 09/20/2015   Procedure: Lower Extremity Angiography;  Surgeon: Lorretta Harp, MD;  Location: [facility];  Service: Cardiovascular;  Laterality: N/A;   PERIPHERAL VASCULAR CATHETERIZATION Right 09/20/2015   Procedure: Peripheral Vascular Intervention;  Surgeon: Lorretta Harp, MD;  Location: [place] [facility];  Service: Cardiovascular;  Laterality: Right;  SFA   ROTATOR CUFF REPAIR Left 7/15   Dr Ronnie Derby   WOUND EXPLORATION Left 12/17/2021   Procedure: LEFT LEG WOUND EXPLORATION AND [place] WITH MYRIAD Holt;  Surgeon: Broadus John, MD;  Location: [facility] OR;  Service: Vascular;  Laterality: Left;    SOCIAL HISTORY:  Social History   Socioeconomic History   Marital status: Married    Spouse name: Not on file   Number of children: 3   Years of education: Not on file   Highest education level: Not on file  Occupational History   Occupation: Managed supply chain--- retired 2013    Comment: [employer]  Tobacco Use   Smoking status: Former    Packs/day: 1.00    Years: 30.00    Total pack years: 30.00     Types: Cigarettes    Quit date: 05/16/1990    Years since quitting: 31.9    Passive exposure: Never   Smokeless tobacco: Never   Tobacco comments:    Quit in 1993  Vaping Use   Vaping Use: Never used  Substance and Sexual Activity   Alcohol use: No    Alcohol/week: 0.0 standard drinks of alcohol   Drug use: No   Sexual activity: Not on file  Other Topics Concern   Not on file  Social History Narrative   Has living will   Wife is health care POA---alternate is son or daughter   Would accept resuscitation but no prolonged artificial ventilation.   No tube feeds if cognitively unaware   Social Determinants of Health   Financial Resource Strain: Low Risk  (03/10/2020)   Overall Financial Resource Strain (CARDIA)    Difficulty of Paying Living Expenses: Not hard at all  Food Insecurity: No Food Insecurity (01/13/2022)   Hunger Vital Sign    Worried About Running Out of Food in the Last Year: Never true    Ran Out of Food in the Last Year: Never  true  Transportation Needs: No Transportation Needs (01/13/2022)   PRAPARE - Hydrologist (Medical): No    Lack of Transportation (Non-Medical): No  Physical Activity: Inactive (03/10/2020)   Exercise Vital Sign    Days of Exercise per Week: 0 days    Minutes of Exercise per Session: 0 min  Stress: No Stress Concern Present (03/10/2020)   [place]    Feeling of Stress : Not at all  Social Connections: Moderately Integrated (03/10/2020)   Social Connection and Isolation Panel [NHANES]    Frequency of Communication with Friends and Family: More than three times a week    Frequency of Social Gatherings with Friends and Family: Never    Attends Religious Services: More than 4 times per year    Active Member of [employer] or Organizations: No    Attends Archivist Meetings: Never    Marital Status: Married  Human resources officer Violence: Not At Risk  (03/10/2020)   Humiliation, Afraid, Rape, and Kick questionnaire    Fear of Current or Ex-Partner: No    Emotionally Abused: No    Physically Abused: No    Sexually Abused: No    FAMILY HISTORY:  Family History  Problem Relation Age of Onset   Hypertension Father        And siblings   Heart disease Father        And second-degree relatives   Transient ischemic attack Father    Lung cancer Mother    Diabetes Mother    Brain cancer Sister    Diabetes Brother    Atrial fibrillation Brother    Heart disease Sister    Heart disease Sister    Thyroid disease Sister    Ulcers Son    Diabetes Son    Diabetes Son     CURRENT MEDICATIONS:  Current Outpatient Medications  Medication Sig Dispense Refill   aspirin 81 MG tablet Take 81 mg by mouth every evening.     chlorthalidone (HYGROTON) 25 MG tablet TAKE 1/2 TABLET(12.5 MG) BY MOUTH DAILY (Patient taking differently: Take 12.5 mg by mouth daily.) 30 tablet 3   clopidogrel (PLAVIX) 75 MG tablet TAKE 1 TABLET(75 MG) BY MOUTH DAILY WITH BREAKFAST 30 tablet 9   Cyanocobalamin (VITAMIN B-12) 500 MCG SUBL Place 500 mcg under the tongue daily.     Deferasirox 180 MG TABS TAKE 4 TABLETS BY MOUTH DAILY 120 tablet 2   doxycycline (VIBRA-TABS) 100 MG tablet Take 1 tablet (100 mg total) by mouth every 12 (twelve) hours. 28 tablet 0   loperamide (IMODIUM A-D) 2 MG tablet Take 2 mg by mouth 4 (four) times daily as needed for diarrhea or loose stools.     losartan (COZAAR) 25 MG tablet Take 1 tablet (25 mg total) by mouth daily. 90 tablet 3   metoprolol succinate (TOPROL-XL) 100 MG 24 hr tablet TAKE 1 TABLET(100 MG) BY MOUTH DAILY WITH OR IMMEDIATELY FOLLOWING A MEAL (Patient taking differently: Take 100 mg by mouth daily.) 90 tablet 3   nitroGLYCERIN (NITROSTAT) 0.4 MG SL tablet Place 1 tablet (0.4 mg total) under the tongue every 5 (five) minutes as needed for chest pain. 25 tablet 1   Omega-3 Fatty Acids (FISH OIL) 1000 MG CAPS Take 1,000 mg by  mouth daily.     pravastatin (PRAVACHOL) 40 MG tablet TAKE 1 TABLET(40 MG) BY MOUTH DAILY 90 tablet 3   traMADol (ULTRAM) 50 MG tablet  Take 1 tablet (50 mg total) by mouth every 8 (eight) hours as needed for moderate pain or severe pain. 20 tablet 0   Vitamin D, Ergocalciferol, (DRISDOL) 1.25 MG (50000 UNIT) CAPS capsule TAKE 1 CAPSULE BY MOUTH 1 TIME A WEEK 12 capsule 2   No current facility-administered medications for this visit.    ALLERGIES:  Allergies  Allergen Reactions   Cardizem [Diltiazem Hcl] Hives and Rash    PHYSICAL EXAM:  Performance status (ECOG): 0 - Asymptomatic  Vitals:   04/05/22 1455  BP: 132/68  Pulse: 78  Resp: 18  Temp: 98.5 F (36.9 C)  SpO2: 99%   Wt Readings from Last 3 Encounters:  04/05/22 197 lb 1.6 oz (89.4 kg)  03/21/22 193 lb (87.5 kg)  02/14/22 192 lb (87.1 kg)   Physical Exam Vitals reviewed.  Constitutional:      Appearance: Normal appearance. He is obese.  Cardiovascular:     Rate and Rhythm: Normal rate and regular rhythm.     Pulses: Normal pulses.     Heart sounds: Normal heart sounds.  Pulmonary:     Effort: Pulmonary effort is normal.     Breath sounds: Normal breath sounds.  Neurological:     General: No focal deficit present.     Mental Status: He is alert and oriented to person, place, and time.  Psychiatric:        Mood and Affect: Mood normal.        Behavior: Behavior normal.     LABORATORY DATA:  I have reviewed the labs as listed.     Latest Ref Rng & Units 04/05/2022    2:01 PM 01/10/2022    2:04 AM 01/09/2022    4:12 PM  CBC  WBC 4.0 - 10.5 K/uL 8.6  8.8    Hemoglobin 13.0 - 17.0 g/dL 9.0  7.9  8.4   Hematocrit 39.0 - 52.0 % 27.1  24.3  24.8   Platelets 150 - 400 K/uL 334  227        Latest Ref Rng & Units 04/05/2022    2:01 PM 01/10/2022    2:04 AM 01/09/2022    1:33 AM  CMP  Glucose 70 - 99 mg/dL 96  100  114   BUN 8 - 23 mg/dL _0 Creatinine 0.61 - 1.24 mg/dL 1.13  1.02  1.20   Sodium 135  - 145 mmol/L 138  136  138   Potassium 3.5 - 5.1 mmol/L 4.0  4.0  4.2   Chloride 98 - 111 mmol/L 104  112  113   CO2 22 - 32 mmol/L _1 Calcium 8.9 - 10.3 mg/dL 8.9  7.8  7.9   Total Protein 6.5 - 8.1 g/dL 7.7  5.6  5.2   Total Bilirubin 0.3 - 1.2 mg/dL 0.4  0.7  0.3   Alkaline Phos 38 - 126 U/L 47  34  36   AST 15 - 41 U/L _2 ALT 0 - 44 U/L _3 Component Value Date/Time   RBC 2.31 (L) 04/05/2022 1401   MCV 117.3 (H) 04/05/2022 1401   MCV 110 (H) 10/12/2021 1623   MCH 39.0 (H) 04/05/2022 1401   MCHC 33.2 04/05/2022 1401   RDW 15.4 04/05/2022 1401   RDW 13.2 10/12/2021 1623   LYMPHSABS 3.1 04/05/2022 1401   MONOABS 0.9  04/05/2022 1401   EOSABS 0.2 04/05/2022 1401   BASOSABS 0.1 04/05/2022 1401    DIAGNOSTIC IMAGING:  I have independently reviewed the scans and discussed with the patient. No results found.   ASSESSMENT:  1.  Hereditary hemochromatosis: -Patient seen for elevated ferritin levels. -EMR evaluation shows hemochromatosis testing on 01/27/2019 which showed heterozygosity for C282Y and H63D variants. -Given the elevated ferritin levels above 1000 and the results of genetic testing, this is compatible with hereditary hemochromatosis. -Patient does have arthritis of the small joints of the hands and arthritis of the back. -No history of CHF but has CAD.  No diabetes. -MRI of the liver on 12/11/2019 shows estimated liver iron concentration approximately 7.7 mg/g compatible with moderate liver iron.  Estimated fat fraction ranges from 7-12%, more pronounced in the right lobe compared to left hepatic lobe.  No focal hepatic lesions seen.  No morphological changes of significant liver disease. -Jadenu 540 mg daily started on 12/24/2019, dose increased to 720 mg daily on 02/09/2020.  - MRI liver (09/21/2021): Diminished liver iron concentration less than 2 mg/g, slightly below "mild" designation (previously well into the "moderate" range).   2.   Macrocytic anemia: -Colonoscopy by Dr. Loletha Carrow on 02/17/2019 shows diverticulosis in the left colon, 12 mm polyp in the cecum, 4 to 6 mm polyp in the ascending colon and 5 mm polyp in the descending colon.  Pathology was consistent with tubular adenomas.   PLAN:  1.  Hereditary hemochromatosis: -He is taking Jadenu 720 mg in the mornings on an empty stomach.  I had reviewed recent hospitalization records from 01/07/2022 through 01/12/2022. - MRI liver (09/21/2021): Dimness liver and constipation less than 2 mg/g, slightly below "mild" designation (previously well into the "moderate" range). - I have reviewed his labs today.  Ferritin is down to 46 and percent saturation of 44.  Hemoglobin is 9.0.  LFTs are normal.  Creatinine is also normal. - I have recommended him to cut down the dose of Jadenu to 540 mg daily (3 tablets daily). - RTC 6 weeks for follow-up with repeat labs and iron panel.   2.  Macrocytic anemia: - Hemoglobin today is 9.0 with MCV of 117.  This is from [place].   3.  Vitamin D deficiency: - Continue vitamin D 50,000 units weekly.  Orders placed this encounter:  Orders Placed This Encounter  Procedures   CBC with Differential/Platelet   Comprehensive metabolic panel   Ferritin   Iron and TIBC      Derek Jack, MD [place] [phone]

## 2022-04-05 NOTE — Patient Instructions (Addendum)
[place]  Discharge Instructions  You were seen and examined today by Dr. Delton Coombes.  Dr. Delton Coombes discussed your most recent lab work which revealed that your ferritin has come down into normal range.   Cut back the deferasirox to 3 pills daily.  Follow-up as scheduled in 6 weeks with labs.    Thank you for choosing [facility] to provide your oncology and hematology care.   To afford each patient quality time with our provider, please arrive at least 15 minutes before your scheduled appointment time. You may need to reschedule your appointment if you arrive late (10 or more minutes). Arriving late affects you and other patients whose appointments are after yours.  Also, if you miss three or more appointments without notifying the office, you may be dismissed from the clinic at the provider's discretion.    Again, thank you for choosing [facility].  Our hope is that these requests will decrease the amount of time that you wait before being seen by our physicians.   If you have a lab appointment with the [facility] please come in thru the Main Entrance and check in at the main information desk.           _____________________________________________________________  Should you have questions after your visit to [facility], please contact our office at [phone] and follow the prompts.  Our office hours are 8:00 a.m. to 4:30 p.m. Monday - Thursday and 8:00 a.m. to 2:30 p.m. Friday.  Please note that voicemails left after 4:00 p.m. may not be returned until the following business day.  We are closed weekends and all major holidays.  You do have access to a nurse 24-7, just call the main number to the clinic [phone] and do not press any options, hold on the line and a nurse will answer the phone.    For prescription refill requests, have your pharmacy contact our office and allow 72 hours.     Masks are optional in the cancer centers. If you would like for your care team to wear a mask while they are taking care of you, please let them know. You may have one support person who is at least 78 years old accompany you for your appointments.

## 2022-04-06 ENCOUNTER — Other Ambulatory Visit (HOSPITAL_COMMUNITY): Payer: Self-pay

## 2022-04-07 ENCOUNTER — Other Ambulatory Visit (HOSPITAL_COMMUNITY): Payer: Self-pay

## 2022-04-17 DIAGNOSIS — H43313 Vitreous membranes and strands, bilateral: Secondary | ICD-10-CM | POA: Diagnosis not present

## 2022-05-09 ENCOUNTER — Other Ambulatory Visit: Payer: Self-pay | Admitting: Internal Medicine

## 2022-05-09 NOTE — Telephone Encounter (Signed)
Last filled 01-12-22 #20 by Dagoberto Ligas, PA Last OV 02-09-22 Next OV 02-12-23 [facility] [address] [employer]

## 2022-05-15 ENCOUNTER — Other Ambulatory Visit: Payer: Self-pay | Admitting: *Deleted

## 2022-05-15 ENCOUNTER — Other Ambulatory Visit (HOSPITAL_COMMUNITY): Payer: Self-pay

## 2022-05-15 ENCOUNTER — Other Ambulatory Visit (HOSPITAL_COMMUNITY): Payer: Self-pay | Admitting: Hematology

## 2022-05-15 MED ORDER — DEFERASIROX 180 MG PO TABS
ORAL_TABLET | ORAL | 2 refills | Status: DC
  Filled 2022-05-15: qty 120, fill #0
  Filled 2022-05-16: qty 120, 30d supply, fill #0
  Filled 2022-06-28: qty 120, 30d supply, fill #1

## 2022-05-15 NOTE — Telephone Encounter (Signed)
Refill approved for Deferasirox.  Patient tolerating and is to continue therapy.

## 2022-05-16 ENCOUNTER — Other Ambulatory Visit: Payer: Self-pay

## 2022-05-16 ENCOUNTER — Other Ambulatory Visit (HOSPITAL_COMMUNITY): Payer: Self-pay

## 2022-05-24 ENCOUNTER — Inpatient Hospital Stay: Payer: Medicare PPO | Attending: Hematology | Admitting: Hematology

## 2022-05-24 ENCOUNTER — Inpatient Hospital Stay: Payer: Medicare PPO

## 2022-05-24 DIAGNOSIS — E538 Deficiency of other specified B group vitamins: Secondary | ICD-10-CM | POA: Diagnosis not present

## 2022-05-24 DIAGNOSIS — E785 Hyperlipidemia, unspecified: Secondary | ICD-10-CM | POA: Insufficient documentation

## 2022-05-24 DIAGNOSIS — M1990 Unspecified osteoarthritis, unspecified site: Secondary | ICD-10-CM | POA: Diagnosis not present

## 2022-05-24 DIAGNOSIS — D509 Iron deficiency anemia, unspecified: Secondary | ICD-10-CM | POA: Insufficient documentation

## 2022-05-24 DIAGNOSIS — R197 Diarrhea, unspecified: Secondary | ICD-10-CM | POA: Diagnosis not present

## 2022-05-24 DIAGNOSIS — Z800 Family history of malignant neoplasm of digestive organs: Secondary | ICD-10-CM | POA: Diagnosis not present

## 2022-05-24 DIAGNOSIS — D649 Anemia, unspecified: Secondary | ICD-10-CM | POA: Insufficient documentation

## 2022-05-24 DIAGNOSIS — E559 Vitamin D deficiency, unspecified: Secondary | ICD-10-CM | POA: Insufficient documentation

## 2022-05-24 DIAGNOSIS — Z79899 Other long term (current) drug therapy: Secondary | ICD-10-CM | POA: Insufficient documentation

## 2022-05-24 DIAGNOSIS — K219 Gastro-esophageal reflux disease without esophagitis: Secondary | ICD-10-CM | POA: Insufficient documentation

## 2022-05-24 DIAGNOSIS — Z86010 Personal history of colonic polyps: Secondary | ICD-10-CM | POA: Insufficient documentation

## 2022-05-24 DIAGNOSIS — Z801 Family history of malignant neoplasm of trachea, bronchus and lung: Secondary | ICD-10-CM | POA: Diagnosis not present

## 2022-05-24 DIAGNOSIS — Z87891 Personal history of nicotine dependence: Secondary | ICD-10-CM | POA: Insufficient documentation

## 2022-05-24 DIAGNOSIS — I10 Essential (primary) hypertension: Secondary | ICD-10-CM | POA: Diagnosis not present

## 2022-05-24 DIAGNOSIS — I739 Peripheral vascular disease, unspecified: Secondary | ICD-10-CM | POA: Insufficient documentation

## 2022-05-24 LAB — IRON AND TIBC
Iron: 89 ug/dL (ref 45–182)
Saturation Ratios: 28 % (ref 17.9–39.5)
TIBC: 315 ug/dL (ref 250–450)
UIBC: 226 ug/dL

## 2022-05-24 LAB — CBC WITH DIFFERENTIAL/PLATELET
Abs Immature Granulocytes: 0.02 10*3/uL (ref 0.00–0.07)
Basophils Absolute: 0.1 10*3/uL (ref 0.0–0.1)
Basophils Relative: 1 %
Eosinophils Absolute: 0.3 10*3/uL (ref 0.0–0.5)
Eosinophils Relative: 4 %
HCT: 28.3 % — ABNORMAL LOW (ref 39.0–52.0)
Hemoglobin: 9.2 g/dL — ABNORMAL LOW (ref 13.0–17.0)
Immature Granulocytes: 0 %
Lymphocytes Relative: 35 %
Lymphs Abs: 2.7 10*3/uL (ref 0.7–4.0)
MCH: 37.9 pg — ABNORMAL HIGH (ref 26.0–34.0)
MCHC: 32.5 g/dL (ref 30.0–36.0)
MCV: 116.5 fL — ABNORMAL HIGH (ref 80.0–100.0)
Monocytes Absolute: 1 10*3/uL (ref 0.1–1.0)
Monocytes Relative: 12 %
Neutro Abs: 3.8 10*3/uL (ref 1.7–7.7)
Neutrophils Relative %: 48 %
Platelets: 368 10*3/uL (ref 150–400)
RBC: 2.43 MIL/uL — ABNORMAL LOW (ref 4.22–5.81)
RDW: 15.3 % (ref 11.5–15.5)
WBC: 7.9 10*3/uL (ref 4.0–10.5)
nRBC: 0.8 % — ABNORMAL HIGH (ref 0.0–0.2)

## 2022-05-24 LAB — COMPREHENSIVE METABOLIC PANEL
ALT: 12 U/L (ref 0–44)
AST: 17 U/L (ref 15–41)
Albumin: 3.9 g/dL (ref 3.5–5.0)
Alkaline Phosphatase: 52 U/L (ref 38–126)
Anion gap: 8 (ref 5–15)
BUN: 21 mg/dL (ref 8–23)
CO2: 25 mmol/L (ref 22–32)
Calcium: 9 mg/dL (ref 8.9–10.3)
Chloride: 103 mmol/L (ref 98–111)
Creatinine, Ser: 1.13 mg/dL (ref 0.61–1.24)
GFR, Estimated: >60 mL/min (ref >60)
Glucose, Bld: 98 mg/dL (ref 70–99)
Potassium: 3.7 mmol/L (ref 3.5–5.1)
Sodium: 136 mmol/L (ref 135–145)
Total Bilirubin: 0.4 mg/dL (ref 0.3–1.2)
Total Protein: 7.7 g/dL (ref 6.5–8.1)

## 2022-05-24 LAB — FERRITIN: Ferritin: 31 ng/mL (ref 24–336)

## 2022-05-24 NOTE — Progress Notes (Signed)
Patient is taking Jadenu as prescribed. He has not missed any doses and reports no side effects at this time.

## 2022-05-24 NOTE — Progress Notes (Signed)
Aaron Sexton, [place] [address]   CLINIC:  Medical Oncology/Hematology  PCP:  Aaron Carbon, MD [place] / [place] [address]  [phone]  REASON FOR VISIT:  Follow-up for hereditary hemochromatosis  PRIOR THERAPY: none  CURRENT THERAPY: Deferasirox 720 mg daily  INTERVAL HISTORY:  [name], a 77 y.o. male, seen for follow-up of hemochromatosis.  He is continuing to tolerate Deferasirox 3 tablets daily very well.  Energy levels are 70%.  Denies any hospitalizations since last visit.  Chronic diarrhea stable.  Numbness in the fingers and toes is also stable.  REVIEW OF SYSTEMS:  Review of Systems  Constitutional:  Negative for appetite change and fatigue.  Gastrointestinal:  Positive for diarrhea. Negative for nausea and vomiting.  All other systems reviewed and are negative.   PAST MEDICAL/SURGICAL HISTORY:  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   Cerebrovascular disease    Moderate ASVD without focal stenosis in 01/2007   Colon polyps 07/2010   single 61m polyp--tubular adenoma   Diverticulosis 2012   found on colonoscopy   Erectile dysfunction    Family history of adverse reaction to anesthesia    difficult for son & sistor to wake    GERD (gastroesophageal reflux disease)    Hematochezia 05/24/2010   Hyperlipidemia    Hypertension    Osteoarthritis    knees/hands-Dr LRonnie Derby  Overweight(278.02)    [place]ipheral [place] 03/11/[place]   Moderate SFA stenosis; history of claudication   Pneumonia    Rosacea    Tobacco abuse, in remission    30-40 pack years discontinued in 1993   Vitamin B12 deficiency    Past Surgical History:  Procedure Laterality Date   APPENDECTOMY     APPLICATION OF WOUND VAC Left 12/17/2021   Procedure: APPLICATION OF PREVENA WOUND VAC LEFT GROIN;   Surgeon: RBroadus John MD;  [facility]  Service: Vascular;  Laterality: Left;   APPLICATION OF WOUND VAC Left 01/08/2022   Procedure: APPLICATION OF WOUND VAC left groin.;  Surgeon: CMarty Heck MD;  [facility]  Service: Vascular;  Laterality: Left;   COLONOSCOPY W/ POLYPECTOMY  2012   ENDARTERECTOMY FEMORAL Left 12/09/2021   Procedure: LEFT COMMON FEMORAL ENDARTERECTOMY WITH 1 CM X 6 CM XWamac  Surgeon: CMarty Heck MD;  [facility]  Service: Vascular;  Laterality: Left;   FEMORAL-POPLITEAL BYPASS GRAFT Left 12/09/2021   Procedure: LEFT FEMORAL- ABOVE KNEE POPLITEAL BYPASS WITH 6 mm x 80 cm PROPATEN GRAFT;  Surgeon: CMarty Heck MD;  [facility]  Service: Vascular;  Laterality: Left;  INSERT ARTERIAL LINE   ILIAC VEIN ANGIOPLASTY / STENTING Right 08/09/2015   INCISION AND DRAINAGE OF WOUND Left 01/08/2022   Procedure: IRRIGATION AND DEBRIDEMENT GROIN LEFT application of Myriad Morcells.;  Surgeon: CMarty Heck MD;  [facility];  Service: Vascular;  Laterality: Left;   INSERTION OF ILIAC STENT  12/09/2021   Procedure: INSERTION OF LEFT COMMON AND EXTERNAL  ILIAC STENTS WITH ANGIOPLASTY;  Surgeon: CMarty Heck MD;  [facility]  Service: Vascular;;   LOWER EXTREMITY ANGIOGRAM Left 12/09/2021   Procedure: LEFT ILIAC AND LOWER EXTREMITY ANGIOGRAM;  Surgeon: CMarty Heck MD;  [facility]  Service: Vascular;  Laterality: Left;  LOWER EXTREMITY ANGIOGRAPHY Bilateral 11/03/2021   Procedure: Lower Extremity Angiography;  Surgeon: Lorretta Harp, MD;  Location: [facility];  Service: Cardiovascular;  Laterality: Bilateral;  Limited Study   PERIPHERAL VASCULAR CATHETERIZATION Bilateral 06/21/2015   Procedure: Lower Extremity Angiography;  Surgeon: Lorretta Harp, MD;  Location: [facility];  Service: Cardiovascular;  Laterality: Bilateral;   PERIPHERAL VASCULAR CATHETERIZATION N/A  06/21/2015   Procedure: Abdominal Aortogram;  Surgeon: Lorretta Harp, MD;  Location: [facility];  Service: Cardiovascular;  Laterality: N/A;   PERIPHERAL VASCULAR CATHETERIZATION N/A 07/26/2015   Procedure: Lower Extremity Angiography;  Surgeon: Lorretta Harp, MD;  Location: [facility];  Service: Cardiovascular;  Laterality: N/A;   PERIPHERAL VASCULAR CATHETERIZATION N/A 08/09/2015   Procedure: Lower Extremity Angiography;  Surgeon: Lorretta Harp, MD;  Location: [facility];  Service: Cardiovascular;  Laterality: N/A;   PERIPHERAL VASCULAR CATHETERIZATION  08/09/2015   Procedure: Peripheral Vascular Intervention;  Surgeon: Lorretta Harp, MD;  Location: [facility];  Service: Cardiovascular;;  rt ext. iliac atherectomy and stent   PERIPHERAL VASCULAR CATHETERIZATION N/A 09/20/2015   Procedure: Lower Extremity Angiography;  Surgeon: Lorretta Harp, MD;  Location: [facility];  Service: Cardiovascular;  Laterality: N/A;   PERIPHERAL VASCULAR CATHETERIZATION Right 09/20/2015   Procedure: Peripheral Vascular Intervention;  Surgeon: Lorretta Harp, MD;  Location: [facility];  Service: Cardiovascular;  Laterality: Right;  SFA   ROTATOR CUFF REPAIR Left 7/15   Dr Ronnie Derby   WOUND EXPLORATION Left 12/17/2021   Procedure: LEFT LEG WOUND EXPLORATION AND Tiburones WITH MYRIAD Reynolds;  Surgeon: Broadus John, MD;  [facility];  Service: Vascular;  Laterality: Left;    SOCIAL HISTORY:  Social History   Socioeconomic History   Marital status: Married    Spouse name: Not on file   Number of children: 3   Years of education: Not on file   Highest education level: Not on file  Occupational History   Occupation: Managed supply chain--- retired 2013    Comment: [employer]  Tobacco Use   Smoking status: Former    Packs/day: 1.00    Years: 30.00    Total pack years: 30.00    Types: Cigarettes    Quit date: 05/16/1990    Years  since quitting: 32.0    Passive exposure: Never   Smokeless tobacco: Never   Tobacco comments:    Quit in 1993  Vaping Use   Vaping Use: Never used  Substance and Sexual Activity   Alcohol use: No    Alcohol/week: 0.0 standard drinks of alcohol   Drug use: No   Sexual activity: Not on file  Other Topics Concern   Not on file  Social History Narrative   Has living will   Wife is health care POA---alternate is son or daughter   Would accept resuscitation but no prolonged artificial ventilation.   No tube feeds if cognitively unaware   Social Determinants of Health   Financial Resource Strain: Low Risk  (03/10/2020)   Overall Financial Resource Strain (CARDIA)    Difficulty of Paying Living Expenses: Not hard at all  Food Insecurity: No Food Insecurity (01/13/2022)   Hunger Vital Sign    Worried About Running Out of Food in the Last Year: Never true    Ran Out of Food in the Last Year: Never true  Transportation Needs: No Transportation Needs (01/13/2022)  PRAPARE - Hydrologist (Medical): No    Lack of Transportation (Non-Medical): No  Physical Activity: Inactive (03/10/2020)   Exercise Vital Sign    Days of Exercise per Week: 0 days    Minutes of Exercise per Session: 0 min  Stress: No Stress Concern Present (03/10/2020)   [place]    Feeling of Stress : Not at all  Social Connections: Moderately Integrated (03/10/2020)   Social Connection and Isolation Panel [NHANES]    Frequency of Communication with Friends and Family: More than three times a week    Frequency of Social Gatherings with Friends and Family: Never    Attends Religious Services: More than 4 times per year    Active Member of [employer] or Organizations: No    Attends Archivist Meetings: Never    Marital Status: Married  Human resources officer Violence: Not At Risk (03/10/2020)   Humiliation, Afraid, Rape, and Kick  questionnaire    Fear of Current or Ex-Partner: No    Emotionally Abused: No    Physically Abused: No    Sexually Abused: No    FAMILY HISTORY:  Family History  Problem Relation Age of Onset   Hypertension Father        And siblings   Heart disease Father        And second-degree relatives   Transient ischemic attack Father    Lung cancer Mother    Diabetes Mother    Brain cancer Sister    Diabetes Brother    Atrial fibrillation Brother    Heart disease Sister    Heart disease Sister    Thyroid disease Sister    Ulcers Son    Diabetes Son    Diabetes Son     CURRENT MEDICATIONS:  Current Outpatient Medications  Medication Sig Dispense Refill   aspirin 81 MG tablet Take 81 mg by mouth every evening.     chlorthalidone (HYGROTON) 25 MG tablet TAKE 1/2 TABLET(12.5 MG) BY MOUTH DAILY (Patient taking differently: Take 12.5 mg by mouth daily.) 30 tablet 3   clopidogrel (PLAVIX) 75 MG tablet TAKE 1 TABLET(75 MG) BY MOUTH DAILY WITH BREAKFAST 30 tablet 9   Cyanocobalamin (VITAMIN B-12) 500 MCG SUBL Place 500 mcg under the tongue daily.     Deferasirox 180 MG TABS TAKE 4 TABLETS BY MOUTH DAILY (Patient taking differently: 3 tablets.) 120 tablet 2   doxycycline (VIBRA-TABS) 100 MG tablet Take 1 tablet (100 mg total) by mouth every 12 (twelve) hours. 28 tablet 0   loperamide (IMODIUM A-D) 2 MG tablet Take 2 mg by mouth 4 (four) times daily as needed for diarrhea or loose stools.     losartan (COZAAR) 25 MG tablet Take 1 tablet (25 mg total) by mouth daily. 90 tablet 3   metoprolol succinate (TOPROL-XL) 100 MG 24 hr tablet TAKE 1 TABLET(100 MG) BY MOUTH DAILY WITH OR IMMEDIATELY FOLLOWING A MEAL (Patient taking differently: Take 100 mg by mouth daily.) 90 tablet 3   Omega-3 Fatty Acids (FISH OIL) 1000 MG CAPS Take 1,000 mg by mouth daily.     pravastatin (PRAVACHOL) 40 MG tablet TAKE 1 TABLET(40 MG) BY MOUTH DAILY 90 tablet 3   traMADol (ULTRAM) 50 MG tablet TAKE 1 TABLET BY MOUTH EVERY  8 HOURS AS NEEDED 90 tablet 0   Vitamin D, Ergocalciferol, (DRISDOL) 1.25 MG (50000 UNIT) CAPS capsule TAKE 1 CAPSULE BY MOUTH 1 TIME A WEEK  12 capsule 2   nitroGLYCERIN (NITROSTAT) 0.4 MG SL tablet Place 1 tablet (0.4 mg total) under the tongue every 5 (five) minutes as needed for chest pain. (Patient not taking: Reported on 05/24/2022) 25 tablet 1   No current facility-administered medications for this visit.    ALLERGIES:  Allergies  Allergen Reactions   Cardizem [Diltiazem Hcl] Hives and Rash    PHYSICAL EXAM:  Performance status (ECOG): 0 - Asymptomatic  Vitals:   05/24/22 1415  BP: 134/70  Pulse: 84  Resp: 18  Temp: 97.8 F (36.6 C)  SpO2: 98%   Wt Readings from Last 3 Encounters:  05/24/22 197 lb 14.4 oz (89.8 kg)  04/05/22 197 lb 1.6 oz (89.4 kg)  03/21/22 193 lb (87.5 kg)   Physical Exam Vitals reviewed.  Constitutional:      Appearance: Normal appearance. He is obese.  Cardiovascular:     Rate and Rhythm: Normal rate and regular rhythm.     Pulses: Normal pulses.     Heart sounds: Normal heart sounds.  Pulmonary:     Effort: Pulmonary effort is normal.     Breath sounds: Normal breath sounds.  Neurological:     General: No focal deficit present.     Mental Status: He is alert and oriented to person, place, and time.  Psychiatric:        Mood and Affect: Mood normal.        Behavior: Behavior normal.     LABORATORY DATA:  I have reviewed the labs as listed.     Latest Ref Rng & Units 05/24/2022    1:51 PM 04/05/2022    2:01 PM 01/10/2022    2:04 AM  CBC  WBC 4.0 - 10.5 K/uL 7.9  8.6  8.8   Hemoglobin 13.0 - 17.0 g/dL 9.2  9.0  7.9   Hematocrit 39.0 - 52.0 % 28.3  27.1  24.3   Platelets 150 - 400 K/uL 368  334  227       Latest Ref Rng & Units 04/05/2022    2:01 PM 01/10/2022    2:04 AM 01/09/2022    1:33 AM  CMP  Glucose 70 - 99 mg/dL 96  100  114   BUN 8 - 23 mg/dL '27  15  15   '$ Creatinine 0.61 - 1.24 mg/dL 1.13  1.02  1.20   Sodium 135 - 145  mmol/L 138  136  138   Potassium 3.5 - 5.1 mmol/L 4.0  4.0  4.2   Chloride 98 - 111 mmol/L 104  112  113   CO2 22 - 32 mmol/L '23  18  15   '$ Calcium 8.9 - 10.3 mg/dL 8.9  7.8  7.9   Total Protein 6.5 - 8.1 g/dL 7.7  5.6  5.2   Total Bilirubin 0.3 - 1.2 mg/dL 0.4  0.7  0.3   Alkaline Phos 38 - 126 U/L 47  34  36   AST 15 - 41 U/L '20  10  11   '$ ALT 0 - 44 U/L '13  8  8       '$ Component Value Date/Time   RBC 2.43 (L) 05/24/2022 1351   MCV 116.5 (H) 05/24/2022 1351   MCV 110 (H) 10/12/2021 1623   MCH 37.9 (H) 05/24/2022 1351   MCHC 32.5 05/24/2022 1351   RDW 15.3 05/24/2022 1351   RDW 13.2 10/12/2021 1623   LYMPHSABS PENDING 05/24/2022 1351   MONOABS PENDING 05/24/2022 1351   EOSABS  PENDING 05/24/2022 1351   BASOSABS PENDING 05/24/2022 1351    DIAGNOSTIC IMAGING:  I have independently reviewed the scans and discussed with the patient. No results found.   ASSESSMENT:  1.  Hereditary hemochromatosis: -Patient seen for elevated ferritin levels. -EMR evaluation shows hemochromatosis testing on 01/27/2019 which showed heterozygosity for C282Y and H63D variants. -Given the elevated ferritin levels above 1000 and the results of genetic testing, this is compatible with hereditary hemochromatosis. -Patient does have arthritis of the small joints of the hands and arthritis of the back. -No history of CHF but has CAD.  No diabetes. -MRI of the liver on 12/11/2019 shows estimated liver iron concentration approximately 7.7 mg/g compatible with moderate liver iron.  Estimated fat fraction ranges from 7-12%, more pronounced in the right lobe compared to left hepatic lobe.  No focal hepatic lesions seen.  No morphological changes of significant liver disease. -Jadenu 540 mg daily started on 12/24/2019, dose increased to 720 mg daily on 02/09/2020.  - MRI liver (09/21/2021): Diminished liver iron concentration less than 2 mg/g, slightly below "mild" designation (previously well into the "moderate" range).    2.  Macrocytic anemia: -Colonoscopy by Dr. Loletha Carrow on 02/17/2019 shows diverticulosis in the left colon, 12 mm polyp in the cecum, 4 to 6 mm polyp in the ascending colon and 5 mm polyp in the descending colon.  Pathology was consistent with tubular adenomas.   PLAN:  1.  Hereditary hemochromatosis: - He is taking Jadenu 540 mg (3 tablets daily) in the mornings on an empty stomach. - MRI of the liver (09/21/2021): Diminished liver iron concentration less than 2 mg/gram slightly below the mild designation.  Previous scan was moderate range. -I reviewed labs today which showed normal LFTs.  Creatinine is 1.13. - Ferritin is 31 and percent saturation 28. - Continue Jadenu 540 mg (3 tablets) daily. - RTC 6 weeks for follow-up with repeat labs.   2.  Macrocytic anemia: - Hemoglobin today is 9.2.  MCV is 116.5.  This is from [place].  Will check N62 and folic acid at next visit.   3.  Vitamin D deficiency: - Continue vitamin D 50,000 units weekly.  Orders placed this encounter:  No orders of the defined types were placed in this encounter.     Derek Jack, MD [place] [phone]

## 2022-05-24 NOTE — Patient Instructions (Signed)
[facility]  Discharge Instructions  You were seen and examined today by Dr. Delton Coombes.  Continue Jadenu as prescribed.  Follow-up as scheduled.  Thank you for choosing [facility] to provide your oncology and hematology care.   To afford each patient quality time with our provider, please arrive at least 15 minutes before your scheduled appointment time. You may need to reschedule your appointment if you arrive late (10 or more minutes). Arriving late affects you and other patients whose appointments are after yours.  Also, if you miss three or more appointments without notifying the office, you may be dismissed from the clinic at the provider's discretion.    Again, thank you for choosing [facility].  Our hope is that these requests will decrease the amount of time that you wait before being seen by our physicians.   If you have a lab appointment with the [facility] please come in thru the Main Entrance and check in at the main information desk.           _____________________________________________________________  Should you have questions after your visit to [facility], please contact our office at [phone] and follow the prompts.  Our office hours are 8:00 a.m. to 4:30 p.m. Monday - Thursday and 8:00 a.m. to 2:30 p.m. Friday.  Please note that voicemails left after 4:00 p.m. may not be returned until the following business day.  We are closed weekends and all major holidays.  You do have access to a nurse 24-7, just call the main number to the clinic [phone] and do not press any options, hold on the line and a nurse will answer the phone.    For prescription refill requests, have your pharmacy contact our office and allow 72 hours.    Masks are optional in the cancer centers. If you would like for your care team to wear a mask while they are taking care of you, please let them know. You  may have one support person who is at least 79 years old accompany you for your appointments.

## 2022-05-30 ENCOUNTER — Other Ambulatory Visit: Payer: Self-pay

## 2022-06-06 ENCOUNTER — Other Ambulatory Visit (HOSPITAL_COMMUNITY): Payer: Self-pay

## 2022-06-12 ENCOUNTER — Other Ambulatory Visit: Payer: Self-pay | Admitting: *Deleted

## 2022-06-12 DIAGNOSIS — I70222 Atherosclerosis of native arteries of extremities with rest pain, left leg: Secondary | ICD-10-CM

## 2022-06-13 ENCOUNTER — Other Ambulatory Visit (HOSPITAL_COMMUNITY): Payer: Self-pay

## 2022-06-18 ENCOUNTER — Encounter (HOSPITAL_COMMUNITY): Payer: Medicare PPO

## 2022-06-27 ENCOUNTER — Ambulatory Visit (HOSPITAL_COMMUNITY)
Admission: RE | Admit: 2022-06-27 | Discharge: 2022-06-27 | Disposition: A | Discharge status: Home / Self Care | Payer: Medicare PPO | Source: Ambulatory Visit | Attending: Cardiovascular Disease | Admitting: Cardiovascular Disease

## 2022-06-27 ENCOUNTER — Ambulatory Visit (INDEPENDENT_AMBULATORY_CARE_PROVIDER_SITE_OTHER)
Admission: RE | Admit: 2022-06-27 | Discharge: 2022-06-27 | Disposition: A | Discharge status: Home / Self Care | Payer: Medicare PPO | Source: Ambulatory Visit | Attending: Vascular Surgery | Admitting: Vascular Surgery

## 2022-06-27 ENCOUNTER — Ambulatory Visit: Payer: Medicare PPO | Admitting: Vascular Surgery

## 2022-06-27 ENCOUNTER — Encounter: Payer: Self-pay | Admitting: Vascular Surgery

## 2022-06-27 VITALS — BP 125/65 | HR 71 | Temp 98.3°F | Resp 16 | Ht 67.0 in | Wt 193.0 lb

## 2022-06-27 DIAGNOSIS — I739 Peripheral vascular disease, unspecified: Secondary | ICD-10-CM | POA: Insufficient documentation

## 2022-06-27 DIAGNOSIS — I70222 Atherosclerosis of native arteries of extremities with rest pain, left leg: Secondary | ICD-10-CM | POA: Insufficient documentation

## 2022-06-27 LAB — VAS US ABI WITH/WO TBI

## 2022-06-27 NOTE — Progress Notes (Signed)
Patient name: Aaron Sexton MRN: [id number] DOB: Apr 03, 1946 Sex: male  REASON FOR VISIT: 6 month follow-up for surveillance  HPI: Aaron Sexton is a 77 y.o. male with multiple medical comorbidities who presents for 6 month follow-up of his PAD.  He underwent a left common femoral endarterectomy with a left common femoral to above-knee popliteal bypass with PTFE including left iliac artery stenting on 12/09/2021 for CLI with tissue loss.  He ultimately required washout of a hematoma on 12/17/2021.  He then had a breakdown of his left groin incision requiring I&D with myriad and a VAC on 01/08/2022.  His cultures grew Staph epidermidis.  His graft was not exposed.  We did treat him with 6 weeks of doxycycline.  His left groin ultimately healed.  He has no new complaints today.  He remains on aspirin Plavix.  He does endorse some cramping at night in the contralateral leg.  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   Cerebrovascular disease    Moderate ASVD without focal stenosis in 01/2007   Colon polyps 07/2010   single 2mm polyp--tubular adenoma   Diverticulosis 2012   found on colonoscopy   Erectile dysfunction    Family history of adverse reaction to anesthesia    difficult for son & sistor to wake    GERD (gastroesophageal reflux disease)    Hematochezia 05/24/2010   Hyperlipidemia    Hypertension    Osteoarthritis    knees/hands-Dr Sherlean Foot   Overweight(278.02)    Peripheral vascular disease (HCC) 03/11/2010   Moderate SFA stenosis; history of claudication   Pneumonia    Rosacea    Tobacco abuse, in remission    30-40 pack years discontinued in 1993   Vitamin B12 deficiency     Past Surgical History:  Procedure Laterality Date   APPENDECTOMY     APPLICATION OF WOUND VAC Left 12/17/2021   Procedure: APPLICATION OF PREVENA WOUND VAC LEFT GROIN;  Surgeon: Victorino Sparrow, MD;  Location: [facility] OR;  Service: Vascular;  Laterality: Left;   APPLICATION OF WOUND VAC Left 01/08/2022   Procedure: APPLICATION OF WOUND VAC left groin.;  Surgeon: Cephus Shelling, MD;  Location: [facility] OR;  Service: Vascular;  Laterality: Left;   COLONOSCOPY W/ POLYPECTOMY  2012   ENDARTERECTOMY FEMORAL Left 12/09/2021   Procedure: LEFT COMMON FEMORAL ENDARTERECTOMY WITH 1 CM X 6 CM XENOSURE BOVINE PATCH ANGIOPLASTY;  Surgeon: Cephus Shelling, MD;  Location: [facility] OR;  Service: Vascular;  Laterality: Left;   FEMORAL-POPLITEAL BYPASS GRAFT Left 12/09/2021   Procedure: LEFT FEMORAL- ABOVE KNEE POPLITEAL BYPASS WITH 6 mm x 80 cm PROPATEN GRAFT;  Surgeon: Cephus Shelling, MD;  Location: [facility] OR;  Service: Vascular;  Laterality: Left;  INSERT ARTERIAL LINE   ILIAC VEIN ANGIOPLASTY / STENTING Right 08/09/2015   INCISION AND DRAINAGE OF WOUND Left 01/08/2022   Procedure: IRRIGATION AND DEBRIDEMENT GROIN LEFT application of Myriad Morcells.;  Surgeon: Cephus Shelling, MD;  Location: [facility] OR;  Service: Vascular;  Laterality: Left;   INSERTION OF ILIAC STENT  12/09/2021   Procedure: INSERTION OF LEFT COMMON AND EXTERNAL  ILIAC STENTS WITH ANGIOPLASTY;  Surgeon: Cephus Shelling, MD;  Location: [facility] OR;  Service: Vascular;;   LOWER EXTREMITY ANGIOGRAM Left 12/09/2021   Procedure: LEFT ILIAC AND LOWER EXTREMITY ANGIOGRAM;  Surgeon: Cephus Shelling, MD;  Location: [facility] OR;  Service: Vascular;  Laterality: Left;   LOWER EXTREMITY ANGIOGRAPHY Bilateral 11/03/2021   Procedure: Lower Extremity Angiography;  Surgeon: Runell Gess, MD;  Location: [facility] INVASIVE CV LAB;  Service: Cardiovascular;  Laterality: Bilateral;  Limited Study   PERIPHERAL VASCULAR CATHETERIZATION Bilateral 06/21/2015   Procedure: Lower Extremity Angiography;  Surgeon: Runell Gess, MD;  Location: [facility] INVASIVE CV LAB;  Service: Cardiovascular;  Laterality: Bilateral;   PERIPHERAL VASCULAR CATHETERIZATION N/A 06/21/2015    Procedure: Abdominal Aortogram;  Surgeon: Runell Gess, MD;  Location: [facility] INVASIVE CV LAB;  Service: Cardiovascular;  Laterality: N/A;   PERIPHERAL VASCULAR CATHETERIZATION N/A 07/26/2015   Procedure: Lower Extremity Angiography;  Surgeon: Runell Gess, MD;  Location: [facility] INVASIVE CV LAB;  Service: Cardiovascular;  Laterality: N/A;   PERIPHERAL VASCULAR CATHETERIZATION N/A 08/09/2015   Procedure: Lower Extremity Angiography;  Surgeon: Runell Gess, MD;  Location: [facility] INVASIVE CV LAB;  Service: Cardiovascular;  Laterality: N/A;   PERIPHERAL VASCULAR CATHETERIZATION  08/09/2015   Procedure: Peripheral Vascular Intervention;  Surgeon: Runell Gess, MD;  Location: [facility] INVASIVE CV LAB;  Service: Cardiovascular;;  rt ext. iliac atherectomy and stent   PERIPHERAL VASCULAR CATHETERIZATION N/A 09/20/2015   Procedure: Lower Extremity Angiography;  Surgeon: Runell Gess, MD;  Location: [facility] INVASIVE CV LAB;  Service: Cardiovascular;  Laterality: N/A;   PERIPHERAL VASCULAR CATHETERIZATION Right 09/20/2015   Procedure: Peripheral Vascular Intervention;  Surgeon: Runell Gess, MD;  Location: [facility] INVASIVE CV LAB;  Service: Cardiovascular;  Laterality: Right;  SFA   ROTATOR CUFF REPAIR Left 7/15   Dr Sherlean Foot   WOUND EXPLORATION Left 12/17/2021   Procedure: LEFT LEG WOUND EXPLORATION AND WASH OUT WITH MYRIAD MORCELLS;  Surgeon: Victorino Sparrow, MD;  Location: [facility] OR;  Service: Vascular;  Laterality: Left;    Family History  Problem Relation Age of Onset   Hypertension Father        And siblings   Heart disease Father        And second-degree relatives   Transient ischemic attack Father    Lung cancer Mother    Diabetes Mother    Brain cancer Sister    Diabetes Brother    Atrial fibrillation Brother    Heart disease Sister    Heart disease Sister    Thyroid disease Sister    Ulcers Son    Diabetes Son    Diabetes Son     SOCIAL HISTORY: Social History   Tobacco Use   Smoking status:  Former    Packs/day: 1.00    Years: 30.00    Total pack years: 30.00    Types: Cigarettes    Quit date: 05/16/1990    Years since quitting: 32.1    Passive exposure: Never   Smokeless tobacco: Never   Tobacco comments:    Quit in 1993  Substance Use Topics   Alcohol use: No    Alcohol/week: 0.0 standard drinks of alcohol    Allergies  Allergen Reactions   Cardizem [Diltiazem Hcl] Hives and Rash    Current Outpatient Medications  Medication Sig Dispense Refill   aspirin 81 MG tablet Take 81 mg by mouth every evening.     chlorthalidone (HYGROTON) 25 MG tablet TAKE 1/2 TABLET(12.5 MG) BY MOUTH DAILY (Patient taking differently: Take 12.5 mg by mouth daily.) 30 tablet 3   clopidogrel (PLAVIX) 75 MG tablet TAKE 1 TABLET(75 MG) BY MOUTH DAILY WITH BREAKFAST 30 tablet 9   Cyanocobalamin (VITAMIN B-12) 500 MCG SUBL  Place 500 mcg under the tongue daily.     Deferasirox 180 MG TABS TAKE 4 TABLETS BY MOUTH DAILY (Patient taking differently: 3 tablets.) 120 tablet 2   loperamide (IMODIUM A-D) 2 MG tablet Take 2 mg by mouth 4 (four) times daily as needed for diarrhea or loose stools.     losartan (COZAAR) 25 MG tablet Take 1 tablet (25 mg total) by mouth daily. 90 tablet 3   metoprolol succinate (TOPROL-XL) 100 MG 24 hr tablet TAKE 1 TABLET(100 MG) BY MOUTH DAILY WITH OR IMMEDIATELY FOLLOWING A MEAL (Patient taking differently: Take 100 mg by mouth daily.) 90 tablet 3   nitroGLYCERIN (NITROSTAT) 0.4 MG SL tablet Place 1 tablet (0.4 mg total) under the tongue every 5 (five) minutes as needed for chest pain. 25 tablet 1   Omega-3 Fatty Acids (FISH OIL) 1000 MG CAPS Take 1,000 mg by mouth daily.     pravastatin (PRAVACHOL) 40 MG tablet TAKE 1 TABLET(40 MG) BY MOUTH DAILY 90 tablet 3   traMADol (ULTRAM) 50 MG tablet TAKE 1 TABLET BY MOUTH EVERY 8 HOURS AS NEEDED 90 tablet 0   Vitamin D, Ergocalciferol, (DRISDOL) 1.25 MG (50000 UNIT) CAPS capsule TAKE 1 CAPSULE BY MOUTH 1 TIME A WEEK 12 capsule 2    doxycycline (VIBRA-TABS) 100 MG tablet Take 1 tablet (100 mg total) by mouth every 12 (twelve) hours. (Patient not taking: Reported on 06/27/2022) 28 tablet 0   No current facility-administered medications for this visit.    REVIEW OF SYSTEMS:  [X]  denotes positive finding, [ ]  denotes negative finding Cardiac  Comments:  Chest pain or chest pressure:    Shortness of breath upon exertion:    Short of breath when lying flat:    Irregular heart rhythm:        Vascular    Pain in calf, thigh, or hip brought on by ambulation:    Pain in feet at night that wakes you up from your sleep:     Blood clot in your veins:    Leg swelling:         Pulmonary    Oxygen at home:    Productive cough:     Wheezing:         Neurologic    Sudden weakness in arms or legs:     Sudden numbness in arms or legs:     Sudden onset of difficulty speaking or slurred speech:    Temporary loss of vision in one eye:     Problems with dizziness:         Gastrointestinal    Blood in stool:     Vomited blood:         Genitourinary    Burning when urinating:     Blood in urine:        Psychiatric    Major depression:         Hematologic    Bleeding problems:    Problems with blood clotting too easily:        Skin    Rashes or ulcers:        Constitutional    Fever or chills:      PHYSICAL EXAM: Vitals:   06/27/22 0847  BP: 125/65  Pulse: 71  Resp: 16  Temp: 98.3 F (36.8 C)  TempSrc: Temporal  SpO2: 92%  Weight: 193 lb (87.5 kg)  Height: 5\' 7"  (1.702 m)    GENERAL: The patient is a well-nourished male, in no acute  distress. The vital signs are documented above. CARDIAC: There is a regular rate and rhythm.  VASCULAR:  Left groin healed Left above-knee popliteal incision healed Left femoral pulse palpable Left AT pulse palpable No right lower extremity tissue loss   DATA:   Left iliac stents are patent with minimal disease Left leg bypass is patent with concern for a moderate  stenosis at the proximal anastomosis with no disease identified on duplex ABIs noncompressible  Assessment/Plan:  77 y.o. male with multiple medical comorbidities who presents for 6 month follow-up and surveillance of his PAD.  He underwent a left common femoral endarterectomy with a left common femoral to above-knee popliteal bypass with PTFE including left iliac artery stenting on 12/09/2021 for CLI with tissue loss.  He ultimately required washout of a hematoma on 12/17/2021.  He then had a breakdown of his left groin incision requiring I&D with myriad and a VAC on 01/08/2022.  His graft was never exposed although he did grow Staph epidermidis that we treated with 6 weeks of antibiotics.  Today his ultrasounds look good.  His iliac stents and left common femoral to above-knee popliteal bypass appear patent.  There is some question of velocity elevations but there is minimal disease on B-mode imaging.  He has a palpable AT pulse at the ankle with no complaints.  I will see him again in 6 months with noninvasive imaging asked that he stay on aspirin Plavix statin.  I did note the fluid collection at the distal anastomosis but there is no evidence of pseudoaneurysm and this fluid does not extend up the graft.  Incisions are all well-healed.   Cephus Shelling, MD [facility] Office: [phone]

## 2022-06-28 ENCOUNTER — Other Ambulatory Visit (HOSPITAL_COMMUNITY): Payer: Self-pay

## 2022-07-01 ENCOUNTER — Other Ambulatory Visit: Payer: Self-pay

## 2022-07-01 DIAGNOSIS — I70222 Atherosclerosis of native arteries of extremities with rest pain, left leg: Secondary | ICD-10-CM

## 2022-07-03 ENCOUNTER — Other Ambulatory Visit: Payer: Self-pay

## 2022-07-04 ENCOUNTER — Telehealth: Payer: Self-pay

## 2022-07-04 ENCOUNTER — Inpatient Hospital Stay: Payer: Medicare PPO | Admitting: Hematology

## 2022-07-04 ENCOUNTER — Other Ambulatory Visit (HOSPITAL_COMMUNITY): Payer: Self-pay

## 2022-07-04 ENCOUNTER — Inpatient Hospital Stay: Payer: Medicare PPO | Attending: Hematology

## 2022-07-04 LAB — CBC WITH DIFFERENTIAL/PLATELET
Abs Immature Granulocytes: 0.02 10*3/uL (ref 0.00–0.07)
Basophils Absolute: 0.1 10*3/uL (ref 0.0–0.1)
Basophils Relative: 1 %
Eosinophils Absolute: 0.3 10*3/uL (ref 0.0–0.5)
Eosinophils Relative: 3 %
HCT: 26 % — ABNORMAL LOW (ref 39.0–52.0)
Hemoglobin: 8.5 g/dL — ABNORMAL LOW (ref 13.0–17.0)
Immature Granulocytes: 0 %
Lymphocytes Relative: 30 %
Lymphs Abs: 2.5 10*3/uL (ref 0.7–4.0)
MCH: 37.6 pg — ABNORMAL HIGH (ref 26.0–34.0)
MCHC: 32.7 g/dL (ref 30.0–36.0)
MCV: 115 fL — ABNORMAL HIGH (ref 80.0–100.0)
Monocytes Absolute: 1 10*3/uL (ref 0.1–1.0)
Monocytes Relative: 12 %
Neutro Abs: 4.5 10*3/uL (ref 1.7–7.7)
Neutrophils Relative %: 54 %
Platelets: 380 10*3/uL (ref 150–400)
RBC: 2.26 MIL/uL — ABNORMAL LOW (ref 4.22–5.81)
RDW: 16 % — ABNORMAL HIGH (ref 11.5–15.5)
WBC: 8.4 10*3/uL (ref 4.0–10.5)
nRBC: 0.8 % — ABNORMAL HIGH (ref 0.0–0.2)

## 2022-07-04 LAB — IRON AND TIBC
Iron: 97 ug/dL (ref 45–182)
Saturation Ratios: 30 % (ref 17.9–39.5)
TIBC: 322 ug/dL (ref 250–450)
UIBC: 225 ug/dL

## 2022-07-04 LAB — COMPREHENSIVE METABOLIC PANEL
ALT: 12 U/L (ref 0–44)
AST: 16 U/L (ref 15–41)
Albumin: 3.9 g/dL (ref 3.5–5.0)
Alkaline Phosphatase: 50 U/L (ref 38–126)
Anion gap: 11 (ref 5–15)
BUN: 23 mg/dL (ref 8–23)
CO2: 22 mmol/L (ref 22–32)
Calcium: 8.7 mg/dL — ABNORMAL LOW (ref 8.9–10.3)
Chloride: 99 mmol/L (ref 98–111)
Creatinine, Ser: 1.23 mg/dL (ref 0.61–1.24)
GFR, Estimated: >60 mL/min (ref >60)
Glucose, Bld: 106 mg/dL — ABNORMAL HIGH (ref 70–99)
Potassium: 3.5 mmol/L (ref 3.5–5.1)
Sodium: 132 mmol/L — ABNORMAL LOW (ref 135–145)
Total Bilirubin: 0.5 mg/dL (ref 0.3–1.2)
Total Protein: 7.5 g/dL (ref 6.5–8.1)

## 2022-07-04 LAB — FERRITIN: Ferritin: 26 ng/mL (ref 24–336)

## 2022-07-04 LAB — VITAMIN B12: Vitamin B-12: 709 pg/mL (ref 180–914)

## 2022-07-04 LAB — FOLATE: Folate: 8.3 ng/mL (ref >5.9)

## 2022-07-04 NOTE — Telephone Encounter (Signed)
Oral Oncology Patient Advocate Encounter  Prior Authorization for Deferasirox has been approved.    PA# [id number]  Effective dates: 05/08/22 through 05/08/23  Patient may continue to fill at [facility].    Berdine Addison, [facility]  [phone] (phone) [phone] (fax) 07/04/2022 4:02 PM

## 2022-07-04 NOTE — Progress Notes (Signed)
biphasic         +---------------+--------+--------+--------+--------+ Distal Stent   188             biphasic         +---------------+--------+--------+--------+--------+ Distal to Stent260             biphasic         +---------------+--------+--------+--------+--------+  +---------------+--------+---------------+--------+-----------------------+ EIA            PSV cm/sStenosis       WaveformComments                +---------------+--------+---------------+--------+-----------------------+ Prox to Stent  188                    biphasic                        +---------------+--------+---------------+--------+-----------------------+  Proximal Stent 260     50-99% stenosisbiphasicdisease not appreciated +---------------+--------+---------------+--------+-----------------------+ Mid Stent      241     50-99% stenosisbiphasicdisease not appreciated +---------------+--------+---------------+--------+-----------------------+ Distal Stent   224     50-99% stenosisbiphasicdisease not appreciated +---------------+--------+---------------+--------+-----------------------+ Distal to Stent155                    biphasic                        +---------------+--------+---------------+--------+-----------------------+   Summary: Stenosis: +-------------------+---------------+ Location           Stent           +-------------------+---------------+ Left Common Iliac  no stenosis     +-------------------+---------------+ Left External Iliac50-99% stenosis +-------------------+---------------+ Category of stenosis based on velocity criteria, however, disease appears minimal.  *See table(s) above for measurements and observations.  Electronically signed by Monica Martinez MD on 06/27/2022 at 9:03:00 AM.    Final    VAS [place] ABI WITH/WO TBI  Result Date: 06/27/2022  LOWER EXTREMITY DOPPLER STUDY Patient Name:  Aaron Sexton  Date of Exam:   06/27/2022 Medical Rec #: [id number]        Accession #:    [id number] Date of Birth: 1946-02-18       Patient Gender: M Patient Age:   77 years Exam Location:  [facility] Procedure:      VAS [place] ABI WITH/WO TBI Referring Phys: --------------------------------------------------------------------------------  Indications: Peripheral artery disease. Left bypass evaluation. High Risk Factors: Hyperlipidemia.  Vascular Interventions: Date: December 09, 2021                          Preoperative diagnosis: Critical limb ischemia of the                         left lower extremity with tissue loss                          Postoperative diagnosis: Same                           Procedure:                         1. Left common femoral endarterectomy including                         endarterectomy of the  biphasic         +---------------+--------+--------+--------+--------+ Distal Stent   188             biphasic         +---------------+--------+--------+--------+--------+ Distal to Stent260             biphasic         +---------------+--------+--------+--------+--------+  +---------------+--------+---------------+--------+-----------------------+ EIA            PSV cm/sStenosis       WaveformComments                +---------------+--------+---------------+--------+-----------------------+ Prox to Stent  188                    biphasic                        +---------------+--------+---------------+--------+-----------------------+  Proximal Stent 260     50-99% stenosisbiphasicdisease not appreciated +---------------+--------+---------------+--------+-----------------------+ Mid Stent      241     50-99% stenosisbiphasicdisease not appreciated +---------------+--------+---------------+--------+-----------------------+ Distal Stent   224     50-99% stenosisbiphasicdisease not appreciated +---------------+--------+---------------+--------+-----------------------+ Distal to Stent155                    biphasic                        +---------------+--------+---------------+--------+-----------------------+   Summary: Stenosis: +-------------------+---------------+ Location           Stent           +-------------------+---------------+ Left Common Iliac  no stenosis     +-------------------+---------------+ Left External Iliac50-99% stenosis +-------------------+---------------+ Category of stenosis based on velocity criteria, however, disease appears minimal.  *See table(s) above for measurements and observations.  Electronically signed by Monica Martinez MD on 06/27/2022 at 9:03:00 AM.    Final    VAS [place] ABI WITH/WO TBI  Result Date: 06/27/2022  LOWER EXTREMITY DOPPLER STUDY Patient Name:  Aaron Sexton  Date of Exam:   06/27/2022 Medical Rec #: [id number]        Accession #:    [id number] Date of Birth: 1946-02-18       Patient Gender: M Patient Age:   77 years Exam Location:  [facility] Procedure:      VAS [place] ABI WITH/WO TBI Referring Phys: --------------------------------------------------------------------------------  Indications: Peripheral artery disease. Left bypass evaluation. High Risk Factors: Hyperlipidemia.  Vascular Interventions: Date: December 09, 2021                          Preoperative diagnosis: Critical limb ischemia of the                         left lower extremity with tissue loss                          Postoperative diagnosis: Same                           Procedure:                         1. Left common femoral endarterectomy including                         endarterectomy of the  biphasic         +---------------+--------+--------+--------+--------+ Distal Stent   188             biphasic         +---------------+--------+--------+--------+--------+ Distal to Stent260             biphasic         +---------------+--------+--------+--------+--------+  +---------------+--------+---------------+--------+-----------------------+ EIA            PSV cm/sStenosis       WaveformComments                +---------------+--------+---------------+--------+-----------------------+ Prox to Stent  188                    biphasic                        +---------------+--------+---------------+--------+-----------------------+  Proximal Stent 260     50-99% stenosisbiphasicdisease not appreciated +---------------+--------+---------------+--------+-----------------------+ Mid Stent      241     50-99% stenosisbiphasicdisease not appreciated +---------------+--------+---------------+--------+-----------------------+ Distal Stent   224     50-99% stenosisbiphasicdisease not appreciated +---------------+--------+---------------+--------+-----------------------+ Distal to Stent155                    biphasic                        +---------------+--------+---------------+--------+-----------------------+   Summary: Stenosis: +-------------------+---------------+ Location           Stent           +-------------------+---------------+ Left Common Iliac  no stenosis     +-------------------+---------------+ Left External Iliac50-99% stenosis +-------------------+---------------+ Category of stenosis based on velocity criteria, however, disease appears minimal.  *See table(s) above for measurements and observations.  Electronically signed by Monica Martinez MD on 06/27/2022 at 9:03:00 AM.    Final    VAS [place] ABI WITH/WO TBI  Result Date: 06/27/2022  LOWER EXTREMITY DOPPLER STUDY Patient Name:  Aaron Sexton  Date of Exam:   06/27/2022 Medical Rec #: [id number]        Accession #:    [id number] Date of Birth: 1946-02-18       Patient Gender: M Patient Age:   77 years Exam Location:  [facility] Procedure:      VAS [place] ABI WITH/WO TBI Referring Phys: --------------------------------------------------------------------------------  Indications: Peripheral artery disease. Left bypass evaluation. High Risk Factors: Hyperlipidemia.  Vascular Interventions: Date: December 09, 2021                          Preoperative diagnosis: Critical limb ischemia of the                         left lower extremity with tissue loss                          Postoperative diagnosis: Same                           Procedure:                         1. Left common femoral endarterectomy including                         endarterectomy of the  6. Left lower extremity arteriogram.  Current ABI:            R=[place], L=[place] Performing Technologist: Ronal Fear RVS, RCS  Examination Guidelines: A complete evaluation includes B-mode imaging, spectral Doppler, color Doppler, and power Doppler as needed of all accessible portions of each vessel. Bilateral testing is considered an integral part of a complete examination. Limited examinations for reoccurring indications may be performed as noted.   Left Graft #1: common femoral to above-knee popliteal +--------------------+--------+---------------+--------+-------------------+                     PSV cm/sStenosis       WaveformComments            +--------------------+--------+---------------+--------+-------------------+ Inflow              178     30-49% stenosisbiphasic                    +--------------------+--------+---------------+--------+-------------------+ Proximal Anastomosis188     50-70% stenosisbiphasicno disease observed +--------------------+--------+---------------+--------+-------------------+ Proximal Graft      138                    biphasic                    +--------------------+--------+---------------+--------+-------------------+ Mid Graft           114                    biphasic                    +--------------------+--------+---------------+--------+-------------------+ Distal Graft        99                     biphasic                     +--------------------+--------+---------------+--------+-------------------+ Distal Anastomosis  139                    biphasic                    +--------------------+--------+---------------+--------+-------------------+ Outflow             111                    biphasic                    +--------------------+--------+---------------+--------+-------------------+   Summary: Left: Widely patent bypass graft without disease appreciated; however, velocities suggest 30-49% stenosis involving the inflow artery and 50-70% stenosis involving the proximal anastomosis. Perigraft fluid collection observed in the distal thigh.  See table(s) above for measurements and observations. Electronically signed by Monica Martinez MD on 06/27/2022 at 9:04:10 AM.    Final    VAS US AORTA/IVC/ILIACS  Result Date: 06/27/2022 ABDOMINAL AORTA STUDY Patient Name:  Aaron Sexton  Date of Exam:   06/27/2022 Medical Rec #: [id number]        Accession #:    [id number] Date of Birth: Jul 07, 1945       Patient Gender: M Patient Age:   73 years Exam Location:  [facility] Procedure:      VAS [place] AORTA/IVC/ILIACS Referring Phys: [name] --------------------------------------------------------------------------------  Indications: Left common and external iliac artery stent evaluation Risk Factors: Hypertension, hyperlipidemia. Vascular Interventions: Date: December 09, 2021  biphasic         +---------------+--------+--------+--------+--------+ Distal Stent   188             biphasic         +---------------+--------+--------+--------+--------+ Distal to Stent260             biphasic         +---------------+--------+--------+--------+--------+  +---------------+--------+---------------+--------+-----------------------+ EIA            PSV cm/sStenosis       WaveformComments                +---------------+--------+---------------+--------+-----------------------+ Prox to Stent  188                    biphasic                        +---------------+--------+---------------+--------+-----------------------+  Proximal Stent 260     50-99% stenosisbiphasicdisease not appreciated +---------------+--------+---------------+--------+-----------------------+ Mid Stent      241     50-99% stenosisbiphasicdisease not appreciated +---------------+--------+---------------+--------+-----------------------+ Distal Stent   224     50-99% stenosisbiphasicdisease not appreciated +---------------+--------+---------------+--------+-----------------------+ Distal to Stent155                    biphasic                        +---------------+--------+---------------+--------+-----------------------+   Summary: Stenosis: +-------------------+---------------+ Location           Stent           +-------------------+---------------+ Left Common Iliac  no stenosis     +-------------------+---------------+ Left External Iliac50-99% stenosis +-------------------+---------------+ Category of stenosis based on velocity criteria, however, disease appears minimal.  *See table(s) above for measurements and observations.  Electronically signed by Monica Martinez MD on 06/27/2022 at 9:03:00 AM.    Final    VAS [place] ABI WITH/WO TBI  Result Date: 06/27/2022  LOWER EXTREMITY DOPPLER STUDY Patient Name:  Aaron Sexton  Date of Exam:   06/27/2022 Medical Rec #: [id number]        Accession #:    [id number] Date of Birth: 1946-02-18       Patient Gender: M Patient Age:   77 years Exam Location:  [facility] Procedure:      VAS [place] ABI WITH/WO TBI Referring Phys: --------------------------------------------------------------------------------  Indications: Peripheral artery disease. Left bypass evaluation. High Risk Factors: Hyperlipidemia.  Vascular Interventions: Date: December 09, 2021                          Preoperative diagnosis: Critical limb ischemia of the                         left lower extremity with tissue loss                          Postoperative diagnosis: Same                           Procedure:                         1. Left common femoral endarterectomy including                         endarterectomy of the  6. Left lower extremity arteriogram.  Current ABI:            R=[place], L=[place] Performing Technologist: Ronal Fear RVS, RCS  Examination Guidelines: A complete evaluation includes B-mode imaging, spectral Doppler, color Doppler, and power Doppler as needed of all accessible portions of each vessel. Bilateral testing is considered an integral part of a complete examination. Limited examinations for reoccurring indications may be performed as noted.   Left Graft #1: common femoral to above-knee popliteal +--------------------+--------+---------------+--------+-------------------+                     PSV cm/sStenosis       WaveformComments            +--------------------+--------+---------------+--------+-------------------+ Inflow              178     30-49% stenosisbiphasic                    +--------------------+--------+---------------+--------+-------------------+ Proximal Anastomosis188     50-70% stenosisbiphasicno disease observed +--------------------+--------+---------------+--------+-------------------+ Proximal Graft      138                    biphasic                    +--------------------+--------+---------------+--------+-------------------+ Mid Graft           114                    biphasic                    +--------------------+--------+---------------+--------+-------------------+ Distal Graft        99                     biphasic                     +--------------------+--------+---------------+--------+-------------------+ Distal Anastomosis  139                    biphasic                    +--------------------+--------+---------------+--------+-------------------+ Outflow             111                    biphasic                    +--------------------+--------+---------------+--------+-------------------+   Summary: Left: Widely patent bypass graft without disease appreciated; however, velocities suggest 30-49% stenosis involving the inflow artery and 50-70% stenosis involving the proximal anastomosis. Perigraft fluid collection observed in the distal thigh.  See table(s) above for measurements and observations. Electronically signed by Monica Martinez MD on 06/27/2022 at 9:04:10 AM.    Final    VAS US AORTA/IVC/ILIACS  Result Date: 06/27/2022 ABDOMINAL AORTA STUDY Patient Name:  Aaron Sexton  Date of Exam:   06/27/2022 Medical Rec #: [id number]        Accession #:    [id number] Date of Birth: Jul 07, 1945       Patient Gender: M Patient Age:   73 years Exam Location:  [facility] Procedure:      VAS US AORTA/IVC/ILIACS Referring Phys: [name] --------------------------------------------------------------------------------  Indications: Left common and external iliac artery stent evaluation Risk Factors: Hypertension, hyperlipidemia. Vascular Interventions: Date: December 09, 2021  DIAGNOSTIC IMAGING:  I have independently reviewed the scans and discussed with the patient. VAS [place] LOWER EXTREMITY BYPASS GRAFT DUPLEX  Result Date: 06/27/2022 LOWER EXTREMITY ARTERIAL DUPLEX STUDY Patient Name:  Aaron Sexton  Date of Exam:   06/27/2022 Medical Rec #: [id number]        Accession #:    [id number] Date of Birth: Aug 07, 1945       Patient Gender: M Patient Age:   60 years Exam Location:  [facility] Procedure:      VAS [place] LOWER EXTREMITY BYPASS GRAFT DUPLEX Referring Phys: Monica Martinez --------------------------------------------------------------------------------  Indications: Peripheral artery disease, and Bypass evaluation. High Risk Factors: Hyperlipidemia.  Vascular Interventions: Date: December 09, 2021                          Preoperative diagnosis: Critical limb ischemia of the                         left lower extremity with tissue loss                          Postoperative diagnosis: Same                          Procedure:                         1. Left common femoral endarterectomy including                         endarterectomy of the distal external iliac artery and                         profunda with bovine pericardial patch angioplasty                         2. Left common femoral to  above-knee popliteal bypass                         with 6 mm ringed PTFE                         3. Left iliac arteriogram                         4. Angioplasty and stent of the left common iliac artery                         (7 mm x 39 mm VBX)                         5. Angioplasty and stent of the left external iliac                         artery (7 mm x 100 mm drug-coated Eluvia post-dilated                         with 6 mm Mustang)  DIAGNOSTIC IMAGING:  I have independently reviewed the scans and discussed with the patient. VAS [place] LOWER EXTREMITY BYPASS GRAFT DUPLEX  Result Date: 06/27/2022 LOWER EXTREMITY ARTERIAL DUPLEX STUDY Patient Name:  Aaron Sexton  Date of Exam:   06/27/2022 Medical Rec #: [id number]        Accession #:    [id number] Date of Birth: Aug 07, 1945       Patient Gender: M Patient Age:   60 years Exam Location:  [facility] Procedure:      VAS [place] LOWER EXTREMITY BYPASS GRAFT DUPLEX Referring Phys: Monica Martinez --------------------------------------------------------------------------------  Indications: Peripheral artery disease, and Bypass evaluation. High Risk Factors: Hyperlipidemia.  Vascular Interventions: Date: December 09, 2021                          Preoperative diagnosis: Critical limb ischemia of the                         left lower extremity with tissue loss                          Postoperative diagnosis: Same                          Procedure:                         1. Left common femoral endarterectomy including                         endarterectomy of the distal external iliac artery and                         profunda with bovine pericardial patch angioplasty                         2. Left common femoral to  above-knee popliteal bypass                         with 6 mm ringed PTFE                         3. Left iliac arteriogram                         4. Angioplasty and stent of the left common iliac artery                         (7 mm x 39 mm VBX)                         5. Angioplasty and stent of the left external iliac                         artery (7 mm x 100 mm drug-coated Eluvia post-dilated                         with 6 mm Mustang)  biphasic         +---------------+--------+--------+--------+--------+ Distal Stent   188             biphasic         +---------------+--------+--------+--------+--------+ Distal to Stent260             biphasic         +---------------+--------+--------+--------+--------+  +---------------+--------+---------------+--------+-----------------------+ EIA            PSV cm/sStenosis       WaveformComments                +---------------+--------+---------------+--------+-----------------------+ Prox to Stent  188                    biphasic                        +---------------+--------+---------------+--------+-----------------------+  Proximal Stent 260     50-99% stenosisbiphasicdisease not appreciated +---------------+--------+---------------+--------+-----------------------+ Mid Stent      241     50-99% stenosisbiphasicdisease not appreciated +---------------+--------+---------------+--------+-----------------------+ Distal Stent   224     50-99% stenosisbiphasicdisease not appreciated +---------------+--------+---------------+--------+-----------------------+ Distal to Stent155                    biphasic                        +---------------+--------+---------------+--------+-----------------------+   Summary: Stenosis: +-------------------+---------------+ Location           Stent           +-------------------+---------------+ Left Common Iliac  no stenosis     +-------------------+---------------+ Left External Iliac50-99% stenosis +-------------------+---------------+ Category of stenosis based on velocity criteria, however, disease appears minimal.  *See table(s) above for measurements and observations.  Electronically signed by Monica Martinez MD on 06/27/2022 at 9:03:00 AM.    Final    VAS [place] ABI WITH/WO TBI  Result Date: 06/27/2022  LOWER EXTREMITY DOPPLER STUDY Patient Name:  Aaron Sexton  Date of Exam:   06/27/2022 Medical Rec #: [id number]        Accession #:    [id number] Date of Birth: 1946-02-18       Patient Gender: M Patient Age:   77 years Exam Location:  [facility] Procedure:      VAS [place] ABI WITH/WO TBI Referring Phys: --------------------------------------------------------------------------------  Indications: Peripheral artery disease. Left bypass evaluation. High Risk Factors: Hyperlipidemia.  Vascular Interventions: Date: December 09, 2021                          Preoperative diagnosis: Critical limb ischemia of the                         left lower extremity with tissue loss                          Postoperative diagnosis: Same                           Procedure:                         1. Left common femoral endarterectomy including                         endarterectomy of the  biphasic         +---------------+--------+--------+--------+--------+ Distal Stent   188             biphasic         +---------------+--------+--------+--------+--------+ Distal to Stent260             biphasic         +---------------+--------+--------+--------+--------+  +---------------+--------+---------------+--------+-----------------------+ EIA            PSV cm/sStenosis       WaveformComments                +---------------+--------+---------------+--------+-----------------------+ Prox to Stent  188                    biphasic                        +---------------+--------+---------------+--------+-----------------------+  Proximal Stent 260     50-99% stenosisbiphasicdisease not appreciated +---------------+--------+---------------+--------+-----------------------+ Mid Stent      241     50-99% stenosisbiphasicdisease not appreciated +---------------+--------+---------------+--------+-----------------------+ Distal Stent   224     50-99% stenosisbiphasicdisease not appreciated +---------------+--------+---------------+--------+-----------------------+ Distal to Stent155                    biphasic                        +---------------+--------+---------------+--------+-----------------------+   Summary: Stenosis: +-------------------+---------------+ Location           Stent           +-------------------+---------------+ Left Common Iliac  no stenosis     +-------------------+---------------+ Left External Iliac50-99% stenosis +-------------------+---------------+ Category of stenosis based on velocity criteria, however, disease appears minimal.  *See table(s) above for measurements and observations.  Electronically signed by Monica Martinez MD on 06/27/2022 at 9:03:00 AM.    Final    VAS [place] ABI WITH/WO TBI  Result Date: 06/27/2022  LOWER EXTREMITY DOPPLER STUDY Patient Name:  Aaron Sexton  Date of Exam:   06/27/2022 Medical Rec #: [id number]        Accession #:    [id number] Date of Birth: 1946-02-18       Patient Gender: M Patient Age:   77 years Exam Location:  [facility] Procedure:      VAS [place] ABI WITH/WO TBI Referring Phys: --------------------------------------------------------------------------------  Indications: Peripheral artery disease. Left bypass evaluation. High Risk Factors: Hyperlipidemia.  Vascular Interventions: Date: December 09, 2021                          Preoperative diagnosis: Critical limb ischemia of the                         left lower extremity with tissue loss                          Postoperative diagnosis: Same                           Procedure:                         1. Left common femoral endarterectomy including                         endarterectomy of the  DIAGNOSTIC IMAGING:  I have independently reviewed the scans and discussed with the patient. VAS [place] LOWER EXTREMITY BYPASS GRAFT DUPLEX  Result Date: 06/27/2022 LOWER EXTREMITY ARTERIAL DUPLEX STUDY Patient Name:  Aaron Sexton  Date of Exam:   06/27/2022 Medical Rec #: [id number]        Accession #:    [id number] Date of Birth: Aug 07, 1945       Patient Gender: M Patient Age:   60 years Exam Location:  [facility] Procedure:      VAS [place] LOWER EXTREMITY BYPASS GRAFT DUPLEX Referring Phys: Monica Martinez --------------------------------------------------------------------------------  Indications: Peripheral artery disease, and Bypass evaluation. High Risk Factors: Hyperlipidemia.  Vascular Interventions: Date: December 09, 2021                          Preoperative diagnosis: Critical limb ischemia of the                         left lower extremity with tissue loss                          Postoperative diagnosis: Same                          Procedure:                         1. Left common femoral endarterectomy including                         endarterectomy of the distal external iliac artery and                         profunda with bovine pericardial patch angioplasty                         2. Left common femoral to  above-knee popliteal bypass                         with 6 mm ringed PTFE                         3. Left iliac arteriogram                         4. Angioplasty and stent of the left common iliac artery                         (7 mm x 39 mm VBX)                         5. Angioplasty and stent of the left external iliac                         artery (7 mm x 100 mm drug-coated Eluvia post-dilated                         with 6 mm Mustang)

## 2022-07-04 NOTE — Patient Instructions (Addendum)
[place]  Discharge Instructions  You were seen and examined today by Dr. Delton Coombes.  Only part of your labs are back, but you are slightly more anemic that previously. This is likely related to the [place].  Continue Jadenu, we will call and update you on how to take it. We will request prior authorization so that there is no disruption in your medication.  Follow-up as scheduled.  Thank you for choosing [facility] to provide your oncology and hematology care.   To afford each patient quality time with our provider, please arrive at least 15 minutes before your scheduled appointment time. You may need to reschedule your appointment if you arrive late (10 or more minutes). Arriving late affects you and other patients whose appointments are after yours.  Also, if you miss three or more appointments without notifying the office, you may be dismissed from the clinic at the provider's discretion.    Again, thank you for choosing [facility].  Our hope is that these requests will decrease the amount of time that you wait before being seen by our physicians.   If you have a lab appointment with the [facility] please come in thru the Main Entrance and check in at the main information desk.           _____________________________________________________________  Should you have questions after your visit to [facility], please contact our office at [phone] and follow the prompts.  Our office hours are 8:00 a.m. to 4:30 p.m. Monday - Thursday and 8:00 a.m. to 2:30 p.m. Friday.  Please note that voicemails left after 4:00 p.m. may not be returned until the following business day.  We are closed weekends and all major holidays.  You do have access to a nurse 24-7, just call the main number to the clinic [phone] and do not press any options, hold on the line and a nurse will answer the phone.    For  prescription refill requests, have your pharmacy contact our office and allow 72 hours.    Masks are optional in the cancer centers. If you would like for your care team to wear a mask while they are taking care of you, please let them know. You may have one support person who is at least 79 years old accompany you for your appointments.

## 2022-07-04 NOTE — Telephone Encounter (Signed)
Oral Oncology Patient Advocate Encounter  Re-authorization   Received notification that prior authorization for Deferasirox is due for renewal.   PA submitted on 07/04/22  Key [id number]  Status is pending     Berdine Addison, [place] Patient [place]  [phone] (phone) [phone] (fax) 07/04/2022 4:01 PM

## 2022-07-05 ENCOUNTER — Ambulatory Visit: Payer: Medicare PPO | Admitting: Hematology

## 2022-07-05 ENCOUNTER — Other Ambulatory Visit: Payer: Medicare PPO

## 2022-07-05 NOTE — Progress Notes (Signed)
The following message was received from Dr. Delton Coombes:  "His ferritin is 26. Please let him cut back Deferasirox to 2 tablets daily. RTC 6 weeks."  I have called the patient and made him aware. Patient verbalized understanding. All questions addressed and answered.

## 2022-07-06 ENCOUNTER — Other Ambulatory Visit (HOSPITAL_COMMUNITY): Payer: Self-pay

## 2022-07-18 ENCOUNTER — Other Ambulatory Visit: Payer: Self-pay | Admitting: Cardiovascular Disease

## 2022-07-26 ENCOUNTER — Telehealth: Payer: Self-pay | Admitting: Internal Medicine

## 2022-07-26 NOTE — Telephone Encounter (Signed)
Pt called in requesting a call back regarding paper work he want's PCP to review and sign [phone]

## 2022-07-26 NOTE — Telephone Encounter (Signed)
Spoke to pt. He may come and wait for it to be sign. He is aware he will have to wait if Dr Silvio Pate is busy at the time he comes.

## 2022-07-27 ENCOUNTER — Other Ambulatory Visit (HOSPITAL_COMMUNITY): Payer: Self-pay

## 2022-07-27 ENCOUNTER — Encounter: Payer: Self-pay | Admitting: Internal Medicine

## 2022-07-27 NOTE — Telephone Encounter (Signed)
error 

## 2022-08-08 ENCOUNTER — Other Ambulatory Visit: Payer: Self-pay | Admitting: Internal Medicine

## 2022-08-08 NOTE — Telephone Encounter (Signed)
Last filled 05-09-22 #90 Last OV 02-09-22 Next OV 02-12-23 [facility] [address] [employer]

## 2022-08-15 ENCOUNTER — Other Ambulatory Visit: Payer: Self-pay

## 2022-08-15 NOTE — Progress Notes (Signed)
[facility] [address], [place] [address]    Clinic Day:  08/15/2022  Referring physician: Karie Schwalbe, MD  Patient Care Team: Karie Schwalbe, MD as PCP - General (Internal Medicine) Wyline Mood Dorothe Pea, MD as PCP - Cardiology (Cardiology) Doreatha Massed, MD as Medical Oncologist (Hematology)   ASSESSMENT & PLAN:   Assessment: 1.  Hereditary hemochromatosis: -Patient seen for elevated ferritin levels. -EMR evaluation shows hemochromatosis testing on 01/27/2019 which showed heterozygosity for C282Y and H63D variants. -Given the elevated ferritin levels above 1000 and the results of genetic testing, this is compatible with hereditary hemochromatosis. -Patient does have arthritis of the small joints of the hands and arthritis of the back. -No history of CHF but has CAD.  No diabetes. -MRI of the liver on 12/11/2019 shows estimated liver iron concentration approximately 7.7 mg/g compatible with moderate liver iron.  Estimated fat fraction ranges from 7-12%, more pronounced in the right lobe compared to left hepatic lobe.  No focal hepatic lesions seen.  No morphological changes of significant liver disease. -Jadenu 540 mg daily started on 12/24/2019, dose increased to 720 mg daily on 02/09/2020.  - MRI liver (09/21/2021): Diminished liver iron concentration less than 2 mg/g, slightly below "mild" designation (previously well into the "moderate" range). - Dose decreased to 2 tablets daily on 07/04/2022   2.  Macrocytic anemia: -Colonoscopy by Dr. Myrtie Neither on 02/17/2019 shows diverticulosis in the left colon, 12 mm polyp in the cecum, 4 to 6 mm polyp in the ascending colon and 5 mm polyp in the descending colon.  Pathology was consistent with tubular adenomas.   Plan: 1.  Hereditary hemochromatosis: - He is tolerating Jadenu 3 tablets, 540 mg daily very well. - Previous MRI on 09/21/2021: Diminished liver iron concentration less than 2 mg/g. - Reviewed labs  today which showed LFTs are normal.  Creatinine is normal at 1.23.  Ferritin is decreased to 26. - CBC shows hemoglobin 8.5.  B12 level is normal. - I have recommended decreasing deferasirox 180 mg 2 tablets daily. - Plan to see him back in 6 weeks for follow-up.   2.  Macrocytic anemia: - Hemoglobin is 8.5 today.  MCV is 115.  B12 and folic acid were normal.  Most likely from myelosuppression.   3.  Vitamin D deficiency: - Continue vitamin D 50,000 units weekly.  No orders of the defined types were placed in this encounter.     I,Katie Daubenspeck,acting as a Neurosurgeon for Doreatha Massed, MD.,have documented all relevant documentation on the behalf of Doreatha Massed, MD,as directed by  Doreatha Massed, MD while in the presence of Doreatha Massed, MD.   ***  Mickie Bail   4/9/20249:55 PM  CHIEF COMPLAINT:   Diagnosis: hereditary hemochromatosis   Cancer Staging  No matching staging information was found for the patient.   Prior Therapy: none  Current Therapy:  Deferasirox 560 mg daily    HISTORY OF PRESENT ILLNESS:   Oncology History   No history exists.     INTERVAL HISTORY:   Aaron Sexton is a 77 y.o. male presenting to clinic today for follow up of hereditary hemochromatosis . He was last seen by me on 07/04/22.  Today, he states that he is doing well overall. His appetite level is at ***%. His energy level is at ***%.  PAST MEDICAL HISTORY:   Past Medical History: Past Medical History:  Diagnosis Date   Anemia    Arteriosclerotic cardiovascular disease (ASCVD) 1993  Critical RCA disease in 1993 treated with PTCA   Cerebrovascular disease    Moderate ASVD without focal stenosis in 01/2007   Colon polyps 07/2010   single 2mm polyp--tubular adenoma   Diverticulosis 2012   found on colonoscopy   Erectile dysfunction    Family history of adverse reaction to anesthesia    difficult for son & sistor to wake    GERD (gastroesophageal reflux  disease)    Hematochezia 05/24/2010   Hyperlipidemia    Hypertension    Osteoarthritis    knees/hands-Dr Sherlean Foot   Overweight(278.02)    Peripheral vascular disease (HCC) 03/11/2010   Moderate SFA stenosis; history of claudication   Pneumonia    Rosacea    Tobacco abuse, in remission    30-40 pack years discontinued in 1993   Vitamin B12 deficiency     Surgical History: Past Surgical History:  Procedure Laterality Date   APPENDECTOMY     APPLICATION OF WOUND VAC Left 12/17/2021   Procedure: APPLICATION OF PREVENA WOUND VAC LEFT GROIN;  Surgeon: Victorino Sparrow, MD;  Location[facility] OR;  Service: Vascular;  Laterality: Left;   APPLICATION OF WOUND VAC Left 01/08/2022   Procedure: APPLICATION OF WOUND VAC left groin.;  Surgeon: Cephus Shelling, MD;  Location: [facility] OR;  Service: Vascular;  Laterality: Left;   COLONOSCOPY W/ POLYPECTOMY  2012   ENDARTERECTOMY FEMORAL Left 12/09/2021   Procedure: LEFT COMMON FEMORAL ENDARTERECTOMY WITH 1 CM X 6 CM XENOSURE BOVINE PATCH ANGIOPLASTY;  Surgeon: Cephus Shelling, MD;  Location: [facility] OR;  Service: Vascular;  Laterality: Left;   FEMORAL-POPLITEAL BYPASS GRAFT Left 12/09/2021   Procedure: LEFT FEMORAL- ABOVE KNEE POPLITEAL BYPASS WITH 6 mm x 80 cm PROPATEN GRAFT;  Surgeon: Cephus Shelling, MD;  Location: [facility] OR;  Service: Vascular;  Laterality: Left;  INSERT ARTERIAL LINE   ILIAC VEIN ANGIOPLASTY / STENTING Right 08/09/2015   INCISION AND DRAINAGE OF WOUND Left 01/08/2022   Procedure: IRRIGATION AND DEBRIDEMENT GROIN LEFT application of Myriad Morcells.;  Surgeon: Cephus Shelling, MD;  Location: [facility] OR;  Service: Vascular;  Laterality: Left;   INSERTION OF ILIAC STENT  12/09/2021   Procedure: INSERTION OF LEFT COMMON AND EXTERNAL  ILIAC STENTS WITH ANGIOPLASTY;  Surgeon: Cephus Shelling, MD;  Location: [facility] OR;  Service: Vascular;;   LOWER EXTREMITY ANGIOGRAM Left 12/09/2021   Procedure: LEFT ILIAC AND LOWER EXTREMITY ANGIOGRAM;  Surgeon:  Cephus Shelling, MD;  Location: [facility] OR;  Service: Vascular;  Laterality: Left;   LOWER EXTREMITY ANGIOGRAPHY Bilateral 11/03/2021   Procedure: Lower Extremity Angiography;  Surgeon: Runell Gess, MD;  Location: [facility] INVASIVE CV LAB;  Service: Cardiovascular;  Laterality: Bilateral;  Limited Study   PERIPHERAL VASCULAR CATHETERIZATION Bilateral 06/21/2015   Procedure: Lower Extremity Angiography;  Surgeon: Runell Gess, MD;  Location: [facility] INVASIVE CV LAB;  Service: Cardiovascular;  Laterality: Bilateral;   PERIPHERAL VASCULAR CATHETERIZATION N/A 06/21/2015   Procedure: Abdominal Aortogram;  Surgeon: Runell Gess, MD;  Location: [facility] INVASIVE CV LAB;  Service: Cardiovascular;  Laterality: N/A;   PERIPHERAL VASCULAR CATHETERIZATION N/A 07/26/2015   Procedure: Lower Extremity Angiography;  Surgeon: Runell Gess, MD;  Location: [facility] INVASIVE CV LAB;  Service: Cardiovascular;  Laterality: N/A;   PERIPHERAL VASCULAR CATHETERIZATION N/A 08/09/2015   Procedure: Lower Extremity Angiography;  Surgeon: Runell Gess, MD;  Location: [facility] INVAS[facility] CV LAB;  Service: Cardiovascular;  Laterality: N/A;   PERIPHERAL VASCULAR CATHETERIZATION  08/09/2015   Procedure: Peripheral Vascular  Intervention;  Surgeon: Runell GessJonathan J Berry, MD;  [facility];  Service: Cardiovascular;;  rt ext. iliac atherectomy and stent   PERIPHERAL VASCULAR CATHETERIZATION N/A 09/20/2015   Procedure: Lower Extremity Angiography;  Surgeon: Runell GessJonathan J Berry, MD;  [facility];  Service: Cardiovascular;  Laterality: N/A;   PERIPHERAL VASCULAR CATHETERIZATION Right 09/20/2015   Procedure: Peripheral Vascular Intervention;  Surgeon: Runell GessJonathan J Berry, MD;  [facility];  Service: Cardiovascular;  Laterality: Right;  SFA   ROTATOR CUFF REPAIR Left 7/15   Dr Sherlean FootLucey   WOUND EXPLORATION Left 12/17/2021   Procedure: LEFT LEG WOUND EXPLORATION AND WASH OUT WITH MYRIAD MORCELLS;  Surgeon: Victorino Sparrowobins, Joshua E,  MD;  [facility];  Service: Vascular;  Laterality: Left;    Social History: Social History   Socioeconomic History   Marital status: Married    Spouse name: Not on file   Number of children: 3   Years of education: Not on file   Highest education level: Not on file  Occupational History   Occupation: Managed supply chain--- retired 2013    Comment: [employer]  Tobacco Use   Smoking status: Former    Packs/day: 1.00    Years: 30.00    Additional pack years: 0.00    Total pack years: 30.00    Types: Cigarettes    Quit date: 05/16/1990    Years since quitting: 32.2    Passive exposure: Never   Smokeless tobacco: Never   Tobacco comments:    Quit in 1993  Vaping Use   Vaping Use: Never used  Substance and Sexual Activity   Alcohol use: No    Alcohol/week: 0.0 standard drinks of alcohol   Drug use: No   Sexual activity: Not on file  Other Topics Concern   Not on file  Social History Narrative   Has living will   Wife is health care POA---alternate is son or daughter   Would accept resuscitation but no prolonged artificial ventilation.   No tube feeds if cognitively unaware   Social Determinants of Health   Financial Resource Strain: Low Risk  (03/10/2020)   Overall Financial Resource Strain (CARDIA)    Difficulty of Paying Living Expenses: Not hard at all  Food Insecurity: No Food Insecurity (01/13/2022)   Hunger Vital Sign    Worried About Running Out of Food in the Last Year: Never true    Ran Out of Food in the Last Year: Never true  Transportation Needs: No Transportation Needs (01/13/2022)   PRAPARE - Administrator, Civil ServiceTransportation    Lack of Transportation (Medical): No    Lack of Transportation (Non-Medical): No  Physical Activity: Inactive (03/10/2020)   Exercise Vital Sign    Days of Exercise per Week: 0 days    Minutes of Exercise per Session: 0 min  Stress: No Stress Concern Present (03/10/2020)   [employer] - Occupational  Stress Questionnaire    Feeling of Stress : Not at all  Social Connections: Moderately Integrated (03/10/2020)   Social Connection and Isolation Panel [NHANES]    Frequency of Communication with Friends and Family: More than three times a week    Frequency of Social Gatherings with Friends and Family: Never    Attends Religious Services: More than 4 times per year    Active Member of [employer]ubs or Organizations: No    Attends BankerClub or Organization Meetings: Never    Marital Status: Married  Catering managerntimate Partner Violence:  Not At Risk (03/10/2020)   Humiliation, Afraid, Rape, and Kick questionnaire    Fear of Current or Ex-Partner: No    Emotionally Abused: No    Physically Abused: No    Sexually Abused: No    Family History: Family History  Problem Relation Age of Onset   Hypertension Father        And siblings   Heart disease Father        And second-degree relatives   Transient ischemic attack Father    Lung cancer Mother    Diabetes Mother    Brain cancer Sister    Diabetes Brother    Atrial fibrillation Brother    Heart disease Sister    Heart disease Sister    Thyroid disease Sister    Ulcers Son    Diabetes Son    Diabetes Son     Current Medications:  Current Outpatient Medications:    aspirin 81 MG tablet, Take 81 mg by mouth every evening., Disp: , Rfl:    chlorthalidone (HYGROTON) 25 MG tablet, TAKE 1/2 TABLET(12.5 MG) BY MOUTH DAILY, Disp: 30 tablet, Rfl: 3   clopidogrel (PLAVIX) 75 MG tablet, TAKE 1 TABLET(75 MG) BY MOUTH DAILY WITH BREAKFAST, Disp: 30 tablet, Rfl: 9   Cyanocobalamin (VITAMIN B-12) 500 MCG SUBL, Place 500 mcg under the tongue daily., Disp: , Rfl:    Deferasirox 180 MG TABS, TAKE 4 TABLETS BY MOUTH DAILY (Patient taking differently: 3 tablets.), Disp: 120 tablet, Rfl: 2   doxycycline (VIBRA-TABS) 100 MG tablet, Take 1 tablet (100 mg total) by mouth every 12 (twelve) hours., Disp: 28 tablet, Rfl: 0   loperamide (IMODIUM A-D) 2 MG tablet, Take 2 mg by  mouth 4 (four) times daily as needed for diarrhea or loose stools., Disp: , Rfl:    losartan (COZAAR) 25 MG tablet, Take 1 tablet (25 mg total) by mouth daily., Disp: 90 tablet, Rfl: 3   metoprolol succinate (TOPROL-XL) 100 MG 24 hr tablet, TAKE 1 TABLET(100 MG) BY MOUTH DAILY WITH OR IMMEDIATELY FOLLOWING A MEAL (Patient taking differently: Take 100 mg by mouth daily.), Disp: 90 tablet, Rfl: 3   nitroGLYCERIN (NITROSTAT) 0.4 MG SL tablet, Place 1 tablet (0.4 mg total) under the tongue every 5 (five) minutes as needed for chest pain., Disp: 25 tablet, Rfl: 1   Omega-3 Fatty Acids (FISH OIL) 1000 MG CAPS, Take 1,000 mg by mouth daily., Disp: , Rfl:    pravastatin (PRAVACHOL) 40 MG tablet, TAKE 1 TABLET(40 MG) BY MOUTH DAILY, Disp: 90 tablet, Rfl: 3   traMADol (ULTRAM) 50 MG tablet, TAKE 1 TABLET BY MOUTH EVERY 8 HOURS AS NEEDED, Disp: 90 tablet, Rfl: 0   Vitamin D, Ergocalciferol, (DRISDOL) 1.25 MG (50000 UNIT) CAPS capsule, TAKE 1 CAPSULE BY MOUTH 1 TIME A WEEK, Disp: 12 capsule, Rfl: 2   Allergies: Allergies  Allergen Reactions   Cardizem [Diltiazem Hcl] Hives and Rash    REVIEW OF SYSTEMS:   Review of Systems  Constitutional:  Negative for chills, fatigue and fever.  HENT:   Negative for lump/mass, mouth sores, nosebleeds, sore throat and trouble swallowing.   Eyes:  Negative for eye problems.  Respiratory:  Negative for cough and shortness of breath.   Cardiovascular:  Negative for chest pain, leg swelling and palpitations.  Gastrointestinal:  Negative for abdominal pain, constipation, diarrhea, nausea and vomiting.  Genitourinary:  Negative for bladder incontinence, difficulty urinating, dysuria, frequency, hematuria and nocturia.   Musculoskeletal:  Negative for arthralgias, back pain,  flank pain, myalgias and neck pain.  Skin:  Negative for itching and rash.  Neurological:  Negative for dizziness, headaches and numbness.  Hematological:  Does not bruise/bleed easily.   Psychiatric/Behavioral:  Negative for depression, sleep disturbance and suicidal ideas. The patient is not nervous/anxious.   All other systems reviewed and are negative.    VITALS:   There were no vitals taken for this visit.  Wt Readings from Last 3 Encounters:  07/04/22 200 lb 4.8 oz (90.9 kg)  06/27/22 193 lb (87.5 kg)  05/24/22 197 lb 14.4 oz (89.8 kg)    There is no height or weight on file to calculate BMI.  Performance status (ECOG): {CHL ONC [id number]  PHYSICAL EXAM:   Physical Exam Vitals and nursing note reviewed. Exam conducted with a chaperone present.  Constitutional:      Appearance: Normal appearance.  Cardiovascular:     Rate and Rhythm: Normal rate and regular rhythm.     Pulses: Normal pulses.     Heart sounds: Normal heart sounds.  Pulmonary:     Effort: Pulmonary effort is normal.     Breath sounds: Normal breath sounds.  Abdominal:     Palpations: Abdomen is soft. There is no hepatomegaly, splenomegaly or mass.     Tenderness: There is no abdominal tenderness.  Musculoskeletal:     Right lower leg: No edema.     Left lower leg: No edema.  Lymphadenopathy:     Cervical: No cervical adenopathy.     Right cervical: No superficial, deep or posterior cervical adenopathy.    Left cervical: No superficial, deep or posterior cervical adenopathy.     Upper Body:     Right upper body: No supraclavicular or axillary adenopathy.     Left upper body: No supraclavicular or axillary adenopathy.  Neurological:     General: No focal deficit present.     Mental Status: He is alert and oriented to person, place, and time.  Psychiatric:        Mood and Affect: Mood normal.        Behavior: Behavior normal.     LABS:      Latest Ref Rng & Units 07/04/2022    2:29 PM 05/24/2022    1:51 PM 04/05/2022    2:01 PM  CBC  WBC 4.0 - 10.5 K/uL 8.4  7.9  8.6   Hemoglobin 13.0 - 17.0 g/dL 8.5  9.2  9.0   Hematocrit 39.0 - 52.0 % 26.0  28.3  27.1   Platelets  150 - 400 K/uL 380  368  334       Latest Ref Rng & Units 07/04/2022    2:29 PM 05/24/2022    1:51 PM 04/05/2022    2:01 PM  CMP  Glucose 70 - 99 mg/dL 622  98  96   BUN 8 - 23 mg/dL 23  21  27    Creatinine 0.61 - 1.24 mg/dL 2.97  9.89  2.11   Sodium 135 - 145 mmol/L 132  136  138   Potassium 3.5 - 5.1 mmol/L 3.5  3.7  4.0   Chloride 98 - 111 mmol/L 99  103  104   CO2 22 - 32 mmol/L 22  25  23    Calcium 8.9 - 10.3 mg/dL 8.7  9.0  8.9   Total Protein 6.5 - 8.1 g/dL 7.5  7.7  7.7   Total Bilirubin 0.3 - 1.2 mg/dL 0.5  0.4  0.4   Alkaline Phos  38 - 126 U/L 50  52  47   AST 15 - 41 U/L 16  17  20    ALT 0 - 44 U/L 12  12  13       No results found for: "CEA1", "CEA" / No results found for: "CEA1", "CEA" No results found for: "PSA1" No results found for: "CAN199" No results found for: "CAN125"  Lab Results  Component Value Date   TOTALPROTELP 7.4 05/18/2021   ALBUMINELP 4.0 05/18/2021   A1GS 0.3 05/18/2021   A2GS 0.7 05/18/2021   BETS 1.4 (H) 05/18/2021   GAMS 1.1 05/18/2021   MSPIKE Not Observed 05/18/2021   SPEI Comment 05/18/2021   Lab Results  Component Value Date   TIBC 322 07/04/2022   TIBC 315 05/24/2022   TIBC 304 04/05/2022   FERRITIN 26 07/04/2022   FERRITIN 31 05/24/2022   FERRITIN 46 04/05/2022   IRONPCTSAT 30 07/04/2022   IRONPCTSAT 28 05/24/2022   IRONPCTSAT 44 (H) 04/05/2022   Lab Results  Component Value Date   LDH 129 05/18/2021   LDH 131 01/12/2021   LDH 130 11/12/2019     STUDIES:   No results found.

## 2022-08-16 ENCOUNTER — Inpatient Hospital Stay: Payer: Medicare PPO | Admitting: Hematology

## 2022-08-16 ENCOUNTER — Inpatient Hospital Stay: Payer: Medicare PPO | Attending: Hematology | Admitting: Hematology

## 2022-08-16 DIAGNOSIS — Z87891 Personal history of nicotine dependence: Secondary | ICD-10-CM | POA: Insufficient documentation

## 2022-08-16 DIAGNOSIS — D539 Nutritional anemia, unspecified: Secondary | ICD-10-CM | POA: Insufficient documentation

## 2022-08-16 DIAGNOSIS — E559 Vitamin D deficiency, unspecified: Secondary | ICD-10-CM | POA: Insufficient documentation

## 2022-08-16 DIAGNOSIS — E539 Vitamin B deficiency, unspecified: Secondary | ICD-10-CM | POA: Diagnosis not present

## 2022-08-16 LAB — CBC WITH DIFFERENTIAL/PLATELET
Abs Immature Granulocytes: 0.05 10*3/uL (ref 0.00–0.07)
Basophils Absolute: 0.1 10*3/uL (ref 0.0–0.1)
Basophils Relative: 1 %
Eosinophils Absolute: 0.3 10*3/uL (ref 0.0–0.5)
Eosinophils Relative: 3 %
HCT: 24 % — ABNORMAL LOW (ref 39.0–52.0)
Hemoglobin: 7.8 g/dL — ABNORMAL LOW (ref 13.0–17.0)
Immature Granulocytes: 1 %
Lymphocytes Relative: 28 %
Lymphs Abs: 2.7 10*3/uL (ref 0.7–4.0)
MCH: 36.1 pg — ABNORMAL HIGH (ref 26.0–34.0)
MCHC: 32.5 g/dL (ref 30.0–36.0)
MCV: 111.1 fL — ABNORMAL HIGH (ref 80.0–100.0)
Monocytes Absolute: 1.1 10*3/uL — ABNORMAL HIGH (ref 0.1–1.0)
Monocytes Relative: 11 %
Neutro Abs: 5.3 10*3/uL (ref 1.7–7.7)
Neutrophils Relative %: 56 %
Platelets: 413 10*3/uL — ABNORMAL HIGH (ref 150–400)
RBC: 2.16 MIL/uL — ABNORMAL LOW (ref 4.22–5.81)
RDW: 18.3 % — ABNORMAL HIGH (ref 11.5–15.5)
WBC: 9.5 10*3/uL (ref 4.0–10.5)
nRBC: 1.2 % — ABNORMAL HIGH (ref 0.0–0.2)

## 2022-08-16 LAB — COMPREHENSIVE METABOLIC PANEL
ALT: 12 U/L (ref 0–44)
AST: 16 U/L (ref 15–41)
Albumin: 3.9 g/dL (ref 3.5–5.0)
Alkaline Phosphatase: 50 U/L (ref 38–126)
Anion gap: 11 (ref 5–15)
BUN: 23 mg/dL (ref 8–23)
CO2: 22 mmol/L (ref 22–32)
Calcium: 9 mg/dL (ref 8.9–10.3)
Chloride: 99 mmol/L (ref 98–111)
Creatinine, Ser: 1.18 mg/dL (ref 0.61–1.24)
GFR, Estimated: >60 mL/min (ref >60)
Glucose, Bld: 90 mg/dL (ref 70–99)
Potassium: 3.7 mmol/L (ref 3.5–5.1)
Sodium: 132 mmol/L — ABNORMAL LOW (ref 135–145)
Total Bilirubin: 0.7 mg/dL (ref 0.3–1.2)
Total Protein: 7.7 g/dL (ref 6.5–8.1)

## 2022-08-16 LAB — RETICULOCYTES
Immature Retic Fract: 33.6 % — ABNORMAL HIGH (ref 2.3–15.9)
RBC.: 2 MIL/uL — ABNORMAL LOW (ref 4.22–5.81)
Retic Count, Absolute: 64.2 10*3/uL (ref 19.0–186.0)
Retic Ct Pct: 3.2 % — ABNORMAL HIGH (ref 0.4–3.1)

## 2022-08-16 LAB — FERRITIN: Ferritin: 21 ng/mL — ABNORMAL LOW (ref 24–336)

## 2022-08-16 LAB — IRON AND TIBC
Iron: 37 ug/dL — ABNORMAL LOW (ref 45–182)
Saturation Ratios: 11 % — ABNORMAL LOW (ref 17.9–39.5)
TIBC: 350 ug/dL (ref 250–450)
UIBC: 313 ug/dL

## 2022-08-16 LAB — DIRECT ANTIGLOBULIN TEST (NOT AT ARMC)
DAT, IgG: NEGATIVE
DAT, complement: NEGATIVE

## 2022-08-16 LAB — VITAMIN D 25 HYDROXY (VIT D DEFICIENCY, FRACTURES): Vit D, 25-Hydroxy: 108.48 ng/mL — ABNORMAL HIGH (ref 30–100)

## 2022-08-16 LAB — LACTATE DEHYDROGENASE: LDH: 138 U/L (ref 98–192)

## 2022-08-16 NOTE — Patient Instructions (Addendum)
[facility]  Discharge Instructions  You were seen and examined today by Dr. Ellin Saba.  Dr. Ellin Saba discussed your most recent lab work which revealed that your hemoglobin has dropped to 7.8 today.  Dr. Ellin Saba is going to order more labs and some stool cards to see what may be causing your hemoglobin to drop.  Hold the [place] as it may be causing your hemoglobin to drop.  Follow-up as scheduled in 4 weeks.    Thank you for choosing [facility] to provide your oncology and hematology care.   To afford each patient quality time with our provider, please arrive at least 15 minutes before your scheduled appointment time. You may need to reschedule your appointment if you arrive late (10 or more minutes). Arriving late affects you and other patients whose appointments are after yours.  Also, if you miss three or more appointments without notifying the office, you may be dismissed from the clinic at the provider's discretion.    Again, thank you for choosing [facility].  Our hope is that these requests will decrease the amount of time that you wait before being seen by our physicians.   If you have a lab appointment with the Cancer Center - please note that after April 8th, all labs will be drawn in the cancer center.  You do not have to check in or register with the main entrance as you have in the past but will complete your check-in at the cancer center.            _____________________________________________________________  Should you have questions after your visit to [facility], please contact our office at [phone] and follow the prompts.  Our office hours are 8:00 a.m. to 4:30 p.m. Monday - Thursday and 8:00 a.m. to 2:30 p.m. Friday.  Please note that voicemails left after 4:00 p.m. may not be returned until the following business day.  We are closed weekends and all major holidays.  You do  have access to a nurse 24-7, just call the main number to the clinic [phone] and do not press any options, hold on the line and a nurse will answer the phone.    For prescription refill requests, have your pharmacy contact our office and allow 72 hours.    Masks are no longer required in the cancer centers. If you would like for your care team to wear a mask while they are taking care of you, please let them know. You may have one support person who is at least 80 years old accompany you for your appointments.

## 2022-08-17 ENCOUNTER — Other Ambulatory Visit (HOSPITAL_COMMUNITY): Payer: Self-pay

## 2022-08-18 LAB — COPPER, SERUM: Copper: 105 ug/dL (ref 69–132)

## 2022-08-18 LAB — KAPPA/LAMBDA LIGHT CHAINS
Kappa free light chain: 60.3 mg/L — ABNORMAL HIGH (ref 3.3–19.4)
Kappa, lambda light chain ratio: 1.97 — ABNORMAL HIGH (ref 0.26–1.65)
Lambda free light chains: 30.6 mg/L — ABNORMAL HIGH (ref 5.7–26.3)

## 2022-08-21 LAB — PROTEIN ELECTROPHORESIS, SERUM
A/G Ratio: 1.2 (ref 0.7–1.7)
Albumin ELP: 4 g/dL (ref 2.9–4.4)
Alpha-1-Globulin: 0.2 g/dL (ref 0.0–0.4)
Alpha-2-Globulin: 0.7 g/dL (ref 0.4–1.0)
Beta Globulin: 1 g/dL (ref 0.7–1.3)
Gamma Globulin: 1.3 g/dL (ref 0.4–1.8)
Globulin, Total: 3.3 g/dL (ref 2.2–3.9)
Total Protein ELP: 7.3 g/dL (ref 6.0–8.5)

## 2022-08-22 LAB — METHYLMALONIC ACID, SERUM: Methylmalonic Acid, Quantitative: 193 nmol/L (ref 0–378)

## 2022-08-23 ENCOUNTER — Telehealth: Payer: Self-pay

## 2022-08-23 ENCOUNTER — Other Ambulatory Visit (HOSPITAL_COMMUNITY)
Admission: RE | Admit: 2022-08-23 | Discharge: 2022-08-23 | Disposition: A | Discharge status: Home / Self Care | Payer: Medicare PPO | Source: Ambulatory Visit | Attending: Hematology | Admitting: Hematology

## 2022-08-23 DIAGNOSIS — D649 Anemia, unspecified: Secondary | ICD-10-CM | POA: Diagnosis not present

## 2022-08-23 LAB — OCCULT BLOOD X 1 CARD TO LAB, STOOL
Fecal Occult Bld: NEGATIVE
Fecal Occult Bld: POSITIVE — AB
Fecal Occult Bld: POSITIVE — AB

## 2022-08-23 NOTE — Telephone Encounter (Signed)
Patient's wife was in clinic today and asked to speak to nurse. She stated patient was really weak and only going from bed to bathroom and having chest pains at night, but thought it was indigestion. Patient's wife wanted Dr. Kirtland Bouchard.  To know about it and what did he suggest.   I called patients number several times with no answer and left message for patient to call clinic back.

## 2022-08-24 ENCOUNTER — Telehealth: Payer: Self-pay | Admitting: *Deleted

## 2022-08-24 ENCOUNTER — Telehealth: Payer: Self-pay | Admitting: Internal Medicine

## 2022-08-24 ENCOUNTER — Encounter: Payer: Self-pay | Admitting: Physician Assistant

## 2022-08-24 NOTE — Telephone Encounter (Signed)
Patient would like to know if a referral can be sent out  for patient to have a colonoscopy. He's not able to get in to his GI off until June.He had 3 hemoccult cards done at the cancer center and 2 cards were positive for blood in his stool and they wold like him to see an GI for an colonoscopy. And his hemoglobin is 7.8.

## 2022-08-24 NOTE — Telephone Encounter (Signed)
Spoke to pt and wife his appt at [facility]. Hematologist did the last lab result of 7.8 and ordered a iFOB that was positive 2 of 3 cards. They advised him to schedule his GI appointment. They have not advised the hematologist the appt was not until June 14.

## 2022-08-24 NOTE — Progress Notes (Signed)
Patient's wife was made aware of results and advised to make follow up appointment with GI.  Verbalized understanding.  Dr. Ellin Saba aware.

## 2022-08-24 NOTE — Telephone Encounter (Signed)
Spoke to pt's wife. She was very appreciative.  

## 2022-08-24 NOTE — Telephone Encounter (Signed)
Follow up from yesterday's call.  Per Dr. Ellin Saba, he would like for patient to be evaluated in the ER for his ongoing chest pain.  In addition, stool cards were positive 2/3.  Advised that they contact his GI physician, given drop in hemoglobin and positive stool cards.  Made her aware that labs were done 8 days ago and there may be more of a drop in hgb and he may need to get blood in ER, as he is extremely fatigued.  Verbalized understanding and will present to ER in [place].

## 2022-08-24 NOTE — Progress Notes (Signed)
Patient was advised to report to ER due to chest pain, positive occult stools and chest pain.

## 2022-09-01 ENCOUNTER — Other Ambulatory Visit: Payer: Self-pay | Admitting: Hematology

## 2022-09-01 DIAGNOSIS — E559 Vitamin D deficiency, unspecified: Secondary | ICD-10-CM

## 2022-09-12 NOTE — Progress Notes (Signed)
[facility] [address] [place] [address]    Clinic Day:  09/13/2022  Referring physician: Karie Schwalbe, MD  Patient Care Team: Karie Schwalbe, MD as PCP - General (Internal Medicine) Wyline Mood Dorothe Pea, MD as PCP - Cardiology (Cardiology) Doreatha Massed, MD as Medical Oncologist (Hematology)   ASSESSMENT & PLAN:   Assessment: 1.  Hereditary hemochromatosis: -Patient seen for elevated ferritin levels. -EMR evaluation shows hemochromatosis testing on 01/27/2019 which showed heterozygosity for C282Y and H63D variants. -Given the elevated ferritin levels above 1000 and the results of genetic testing, this is compatible with hereditary hemochromatosis. -Patient does have arthritis of the small joints of the hands and arthritis of the back. -No history of CHF but has CAD.  No diabetes. -MRI of the liver on 12/11/2019 shows estimated liver iron concentration approximately 7.7 mg/g compatible with moderate liver iron.  Estimated fat fraction ranges from 7-12%, more pronounced in the right lobe compared to left hepatic lobe.  No focal hepatic lesions seen.  No morphological changes of significant liver disease. -Jadenu 540 mg daily started on 12/24/2019, dose increased to 720 mg daily on 02/09/2020.  - MRI liver (09/21/2021): Diminished liver iron concentration less than 2 mg/g, slightly below "mild" designation (previously well into the "moderate" range). - Dose decreased to 2 tablets daily on 07/04/2022   2.  Macrocytic anemia: -Colonoscopy by Dr. Myrtie Neither on 02/17/2019 shows diverticulosis in the left colon, 12 mm polyp in the cecum, 4 to 6 mm polyp in the ascending colon and 5 mm polyp in the descending colon.  Pathology was consistent with tubular adenomas.    Plan: 1.  Hereditary hemochromatosis: - I have recommended holding Jadenu at last visit on 08/16/2022 as his hemoglobin was low at 7.8 and ferritin was 21. - We will continue to hold it at this time.   Will check ferritin and iron panel next visit.   2.  Macrocytic anemia: - Reviewed labs from 08/16/2022.  MMA, copper levels were normal.  SPEP is negative.  Free light chain ratio is elevated at 1.97.  Coombs test is negative.  Stool cards are positive x 2. - Combination anemia from blood loss, medication induced. - Hemoglobin today 6.1.  Will transfuse 2 units PRBC. - Will reevaluate him in 3 weeks.  If continues to be anemic, will recommend bone marrow aspiration and biopsy. - We will request GI evaluation for colonoscopy with Dr. Myrtie Neither.   3.  Vitamin D deficiency: - Vitamin D level is 108.  I have told him to stop the weekly dose vitamin D.    Orders Placed This Encounter  Procedures   CBC with Differential    Standing Status:   Future    Standing Expiration Date:   09/13/2023   Lactate dehydrogenase    Standing Status:   Future    Standing Expiration Date:   09/13/2023   Immunofixation electrophoresis    Standing Status:   Future    Standing Expiration Date:   09/13/2023   Ferritin    Standing Status:   Future    Standing Expiration Date:   09/13/2023   Iron and TIBC ([facility] ONLY)    Standing Status:   Future    Standing Expiration Date:   09/13/2023   Erythropoietin    Standing Status:   Future    Standing Expiration Date:   09/13/2023   Informed Consent Details: Physician/Practitioner Attestation; Transcribe to consent form and obtain patient signature  Standing Status:   Standing    Number of Occurrences:   1    Order Specific Question:   Physician/Practitioner attestation of informed consent for blood and or blood product transfusion    Answer:   I, the physician/practitioner, attest that I have discussed with the patient the benefits, risks, side effects, alternatives, likelihood of achieving goals and potential problems during recovery for the procedure that I have provided informed consent.    Order Specific Question:   Product(s)    Answer:   All  Product(s)   Type and screen         Standing Status:   Future    Number of Occurrences:   1    Standing Expiration Date:   09/13/2023    Order Specific Question:   Release to patient    Answer:   Immediate   Sample to Blood Bank(Blood Bank Hold)    Standing Status:   Future    Standing Expiration Date:   09/13/2023    Order Specific Question:   Release to patient    Answer:   Immediate      I,Katie Daubenspeck,acting as a scribe for Doreatha Massed, MD.,have documented all relevant documentation on the behalf of Doreatha Massed, MD,as directed by  Doreatha Massed, MD while in the presence of Doreatha Massed, MD.   I, Doreatha Massed MD, have reviewed the above documentation for accuracy and completeness, and I agree with the above.   Doreatha Massed, MD   5/8/20246:15 PM  CHIEF COMPLAINT:   Diagnosis: hereditary hemochromatosis   Prior Therapy: none  Current Therapy:  Deferasirox 560 mg daily    HISTORY OF PRESENT ILLNESS:   Oncology History   No history exists.     INTERVAL HISTORY:   Aaron Sexton is a 77 y.o. male presenting to clinic today for follow up of hereditary hemochromatosis. He was last seen by me on 08/16/22.  Today, he states that he is doing well overall. His appetite level is at 50%. His energy level is at 10%.  PAST MEDICAL HISTORY:   Past Medical History: 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   Cerebrovascular disease    Moderate ASVD without focal stenosis in 01/2007   Colon polyps 07/2010   single 2mm polyp--tubular adenoma   Diverticulosis 2012   found on colonoscopy   Erectile dysfunction    Family history of adverse reaction to anesthesia    difficult for son & sistor to wake    GERD (gastroesophageal reflux disease)    Hematochezia 05/24/2010   Hyperlipidemia    Hypertension    Osteoarthritis     knees/hands-Dr Sherlean Foot   Overweight(278.02)    Peripheral vascular disease (HCC) 03/11/2010   Moderate SFA stenosis; history of claudication   Pneumonia    Rosacea    Tobacco abuse, in remission    30-40 pack years discontinued in 1993   Vitamin B12 deficiency     Surgical History: Past Surgical History:  Procedure Laterality Date   APPENDECTOMY     APPLICATION OF WOUND VAC Left 12/17/2021   Procedure: APPLICATION OF PREVENA WOUND VAC LEFT GROIN;  Surgeon: Victorino Sparrow, MD;  Location: [facility] OR;  Service: Vascular;  Laterality: Left;   APPLICATION OF WOUND VAC Left 01/08/2022   Procedure: APPLICATION OF WOUND VAC left groin.;  Surgeon: Cephus Shelling, MD;  Location: [facility] OR;  Service: Vascular;  Laterality: Left;   COLONOSCOPY W/ POLYPECTOMY  2012   ENDARTERECTOMY FEMORAL Left 12/09/2021   Procedure: LEFT COMMON FEMORAL ENDARTERECTOMY WITH 1 CM X 6 CM XENOSURE BOVINE PATCH ANGIOPLASTY;  Surgeon: Cephus Shelling, MD;  Location: [facility] OR;  Service: Vascular;  Laterality: Left;   FEMORAL-POPLITEAL BYPASS GRAFT Left 12/09/2021   Procedure: LEFT FEMORAL- ABOVE KNEE POPLITEAL BYPASS WITH 6 mm x 80 cm PROPATEN GRAFT;  Surgeon: Cephus Shelling, MD;  Location: [facility] OR;  Service: Vascular;  Laterality: Left;  INSERT ARTERIAL LINE   ILIAC VEIN ANGIOPLASTY / STENTING Right 08/09/2015   INCISION AND DRAINAGE OF WOUND Left 01/08/2022   Procedure: IRRIGATION AND DEBRIDEMENT GROIN LEFT application of Myriad Morcells.;  Surgeon: Cephus Shelling, MD;  Location: [facility] OR;  Service: Vascular;  Laterality: Left;   INSERTION OF ILIAC STENT  12/09/2021   Procedure: INSERTION OF LEFT COMMON AND EXTERNAL  ILIAC STENTS WITH ANGIOPLASTY;  Surgeon: Cephus Shelling, MD;  Location: [facility] OR;  Service: Vascular;;   LOWER EXTREMITY ANGIOGRAM Left 12/09/2021   Procedure: LEFT ILIAC AND LOWER EXTREMITY ANGIOGRAM;  Surgeon: Cephus Shelling, MD;  Location: [facility] OR;  Service: Vascular;  Laterality: Left;   LOWER  EXTREMITY ANGIOGRAPHY Bilateral 11/03/2021   Procedure: Lower Extremity Angiography;  Surgeon: Runell Gess, MD;  Location: [facility] INVASIVE CV LAB;  Service: Cardiovascular;  Laterality: Bilateral;  Limited Study   PERIPHERAL VASCULAR CATHETERIZATION Bilateral 06/21/2015   Procedure: Lower Extremity Angiography;  Surgeon: Runell Gess, MD;  Location: [facility] INVASIVE CV LAB;  Service: Cardiovascular;  Laterality: Bilateral;   PERIPHERAL VASCULAR CATHETERIZATION N/A 06/21/2015   Procedure: Abdominal Aortogram;  Surgeon: Runell Gess, MD;  Location: [facility] INVASIVE CV LAB;  Service: Cardiovascular;  Laterality: N/A;   PERIPHERAL VASCULAR CATHETERIZATION N/A 07/26/2015   Procedure: Lower Extremity Angiography;  Surgeon: Runell Gess, MD;  Location: [facility] INVASIVE CV LAB;  Service: Cardiovascular;  Laterality: N/A;   PERIPHERAL VASCULAR CATHETERIZATION N/A 08/09/2015   Procedure: Lower Extremity Angiography;  Surgeon: Runell Gess, MD;  Location: [facility] INVASIVE CV LAB;  Service: Cardiovascular;  Laterality: N/A;   PERIPHERAL VASCULAR CATHETERIZATION  08/09/2015   Procedure: Peripheral Vascular Intervention;  Surgeon: Runell Gess, MD;  Location: [facility] INVASIVE CV LAB;  Service: Cardiovascular;;  rt ext. iliac atherectomy and stent   PERIPHERAL VASCULAR CATHETERIZATION N/A 09/20/2015   Procedure: Lower Extremity Angiography;  Surgeon: Runell Gess, MD;  Location: [facility] INVASIVE CV LAB;  Service: Cardiovascular;  Laterality: N/A;   PERIPHERAL VASCULAR CATHETERIZATION Right 09/20/2015   Procedure: Peripheral Vascular Intervention;  Surgeon: Runell Gess, MD;  Location: [facility] INVASIVE CV LAB;  Service: Cardiovascular;  Laterality: Right;  SFA   ROTATOR CUFF REPAIR Left 7/15   Dr Sherlean Foot   WOUND EXPLORATION Left 12/17/2021   Procedure: LEFT LEG WOUND EXPLORATION AND WASH OUT WITH MYRIAD MORCELLS;  Surgeon: Victorino Sparrow, MD;  Location: [facility] OR;  Service: Vascular;  Laterality: Left;    Social History: Social  History   Socioeconomic History   Marital status: Married    Spouse name: Not on file   Number of children: 3   Years of education: Not on file   Highest education level: Not on file  Occupational History   Occupation: Managed supply chain--- retired 2013    Comment: [employer]  Tobacco Use   Smoking status: Former    Packs/day: 1.00    Years: 30.00    Additional pack years:  0.00    Total pack years: 30.00    Types: Cigarettes    Quit date: 05/16/1990    Years since quitting: 32.3    Passive exposure: Never   Smokeless tobacco: Never   Tobacco comments:    Quit in 1993  Vaping Use   Vaping Use: Never used  Substance and Sexual Activity   Alcohol use: No    Alcohol/week: 0.0 standard drinks of alcohol   Drug use: No   Sexual activity: Not on file  Other Topics Concern   Not on file  Social History Narrative   Has living will   Wife is health care POA---alternate is son or daughter   Would accept resuscitation but no prolonged artificial ventilation.   No tube feeds if cognitively unaware   Social Determinants of Health   Financial Resource Strain: Low Risk  (03/10/2020)   Overall Financial Resource Strain (CARDIA)    Difficulty of Paying Living Expenses: Not hard at all  Food Insecurity: No Food Insecurity (01/13/2022)   Hunger Vital Sign    Worried About Running Out of Food in the Last Year: Never true    Ran Out of Food in the Last Year: Never true  Transportation Needs: No Transportation Needs (01/13/2022)   PRAPARE - Administrator, Civil Service (Medical): No    Lack of Transportation (Non-Medical): No  Physical Activity: Inactive (03/10/2020)   Exercise Vital Sign    Days of Exercise per Week: 0 days    Minutes of Exercise per Session: 0 min  Stress: No Stress Concern Present (03/10/2020)   [employer] of Occupational Health - Occupational Stress Questionnaire    Feeling of Stress : Not at all  Social Connections: Moderately  Integrated (03/10/2020)   Social Connection and Isolation Panel [NHANES]    Frequency of Communication with Friends and Family: More than three times a week    Frequency of Social Gatherings with Friends and Family: Never    Attends Religious Services: More than 4 times per year    Active Member of [employer] or Organizations: No    Attends Banker Meetings: Never    Marital Status: Married  Catering manager Violence: Not At Risk (03/10/2020)   Humiliation, Afraid, Rape, and Kick questionnaire    Fear of Current or Ex-Partner: No    Emotionally Abused: No    Physically Abused: No    Sexually Abused: No    Family History: Family History  Problem Relation Age of Onset   Hypertension Father        And siblings   Heart disease Father        And second-degree relatives   Transient ischemic attack Father    Lung cancer Mother    Diabetes Mother    Brain cancer Sister    Diabetes Brother    Atrial fibrillation Brother    Heart disease Sister    Heart disease Sister    Thyroid disease Sister    Ulcers Son    Diabetes Son    Diabetes Son     Current Medications:  Current Outpatient Medications:    aspirin 81 MG tablet, Take 81 mg by mouth every evening., Disp: , Rfl:    chlorthalidone (HYGROTON) 25 MG tablet, TAKE 1/2 TABLET(12.5 MG) BY MOUTH DAILY, Disp: 30 tablet, Rfl: 3   clopidogrel (PLAVIX) 75 MG tablet, TAKE 1 TABLET(75 MG) BY MOUTH DAILY WITH BREAKFAST, Disp: 30 tablet, Rfl: 9   Cyanocobalamin (VITAMIN B-12) 500 MCG  SUBL, Place 500 mcg under the tongue daily., Disp: , Rfl:    loperamide (IMODIUM A-D) 2 MG tablet, Take 2 mg by mouth 4 (four) times daily as needed for diarrhea or loose stools., Disp: , Rfl:    losartan (COZAAR) 25 MG tablet, Take 1 tablet (25 mg total) by mouth daily., Disp: 90 tablet, Rfl: 3   metoprolol succinate (TOPROL-XL) 100 MG 24 hr tablet, TAKE 1 TABLET(100 MG) BY MOUTH DAILY WITH OR IMMEDIATELY FOLLOWING A MEAL (Patient taking differently:  Take 100 mg by mouth daily.), Disp: 90 tablet, Rfl: 3   Omega-3 Fatty Acids (FISH OIL) 1000 MG CAPS, Take 1,000 mg by mouth daily., Disp: , Rfl:    pravastatin (PRAVACHOL) 40 MG tablet, TAKE 1 TABLET(40 MG) BY MOUTH DAILY, Disp: 90 tablet, Rfl: 3   traMADol (ULTRAM) 50 MG tablet, TAKE 1 TABLET BY MOUTH EVERY 8 HOURS AS NEEDED, Disp: 90 tablet, Rfl: 0   Vitamin D, Ergocalciferol, (DRISDOL) 1.25 MG (50000 UNIT) CAPS capsule, TAKE 1 CAPSULE BY MOUTH 1 TIME A WEEK, Disp: 12 capsule, Rfl: 2   nitroGLYCERIN (NITROSTAT) 0.4 MG SL tablet, Place 1 tablet (0.4 mg total) under the tongue every 5 (five) minutes as needed for chest pain. (Patient not taking: Reported on 08/16/2022), Disp: 25 tablet, Rfl: 1   Allergies: Allergies  Allergen Reactions   Cardizem [Diltiazem Hcl] Hives and Rash    REVIEW OF SYSTEMS:   Review of Systems  Constitutional:  Negative for chills, fatigue and fever.  HENT:   Negative for lump/mass, mouth sores, nosebleeds, sore throat and trouble swallowing.   Eyes:  Negative for eye problems.  Respiratory:  Positive for cough. Negative for shortness of breath.   Cardiovascular:  Negative for chest pain, leg swelling and palpitations.  Gastrointestinal:  Positive for constipation and diarrhea. Negative for abdominal pain, nausea and vomiting.  Genitourinary:  Negative for bladder incontinence, difficulty urinating, dysuria, frequency, hematuria and nocturia.   Musculoskeletal:  Negative for arthralgias, back pain, flank pain, myalgias and neck pain.  Skin:  Negative for itching and rash.  Neurological:  Negative for dizziness, headaches and numbness.  Hematological:  Does not bruise/bleed easily.  Psychiatric/Behavioral:  Negative for depression, sleep disturbance and suicidal ideas. The patient is not nervous/anxious.   All other systems reviewed and are negative.    VITALS:   Blood pressure (!) 132/58, pulse 87, temperature 97.6 F (36.4 C), temperature source Oral, resp.  rate 18, weight 203 lb (92.1 kg), SpO2 93 %.  Wt Readings from Last 3 Encounters:  09/13/22 203 lb (92.1 kg)  07/04/22 200 lb 4.8 oz (90.9 kg)  06/27/22 193 lb (87.5 kg)    Body mass index is 31.79 kg/m.  Performance status (ECOG): 1 - Symptomatic but completely ambulatory  PHYSICAL EXAM:   Physical Exam Vitals and nursing note reviewed. Exam conducted with a chaperone present.  Constitutional:      Appearance: Normal appearance.  Cardiovascular:     Rate and Rhythm: Normal rate and regular rhythm.     Pulses: Normal pulses.     Heart sounds: Normal heart sounds.  Pulmonary:     Effort: Pulmonary effort is normal.     Breath sounds: Normal breath sounds.  Abdominal:     Palpations: Abdomen is soft. There is no hepatomegaly, splenomegaly or mass.     Tenderness: There is no abdominal tenderness.  Musculoskeletal:     Right lower leg: No edema.     Left lower leg: No edema.  Lymphadenopathy:     Cervical: No cervical adenopathy.     Right cervical: No superficial, deep or posterior cervical adenopathy.    Left cervical: No superficial, deep or posterior cervical adenopathy.     Upper Body:     Right upper body: No supraclavicular or axillary adenopathy.     Left upper body: No supraclavicular or axillary adenopathy.  Neurological:     General: No focal deficit present.     Mental Status: He is alert and oriented to person, place, and time.  Psychiatric:        Mood and Affect: Mood normal.        Behavior: Behavior normal.     LABS:      Latest Ref Rng & Units 09/13/2022    2:08 PM 08/16/2022    2:06 PM 07/04/2022    2:29 PM  CBC  WBC 4.0 - 10.5 K/uL 9.6  9.5  8.4   Hemoglobin 13.0 - 17.0 g/dL 6.1  7.8  8.5   Hematocrit 39.0 - 52.0 % 19.9  24.0  26.0   Platelets 150 - 400 K/uL 445  413  380       Latest Ref Rng & Units 08/16/2022    2:06 PM 07/04/2022    2:29 PM 05/24/2022    1:51 PM  CMP  Glucose 70 - 99 mg/dL 90  932  98   BUN 8 - 23 mg/dL 23  23  21     Creatinine 0.61 - 1.24 mg/dL 3.55  7.32  2.02   Sodium 135 - 145 mmol/L 132  132  136   Potassium 3.5 - 5.1 mmol/L 3.7  3.5  3.7   Chloride 98 - 111 mmol/L 99  99  103   CO2 22 - 32 mmol/L 22  22  25    Calcium 8.9 - 10.3 mg/dL 9.0  8.7  9.0   Total Protein 6.5 - 8.1 g/dL 7.7  7.5  7.7   Total Bilirubin 0.3 - 1.2 mg/dL 0.7  0.5  0.4   Alkaline Phos 38 - 126 U/L 50  50  52   AST 15 - 41 U/L 16  16  17    ALT 0 - 44 U/L 12  12  12       No results found for: "CEA1", "CEA" / No results found for: "CEA1", "CEA" No results found for: "PSA1" No results found for: "CAN199" No results found for: "CAN125"  Lab Results  Component Value Date   TOTALPROTELP 7.3 08/16/2022   ALBUMINELP 4.0 08/16/2022   A1GS 0.2 08/16/2022   A2GS 0.7 08/16/2022   BETS 1.0 08/16/2022   GAMS 1.3 08/16/2022   MSPIKE Not Observed 08/16/2022   SPEI Comment 08/16/2022   Lab Results  Component Value Date   TIBC 350 08/16/2022   TIBC 322 07/04/2022   TIBC 315 05/24/2022   FERRITIN 21 (L) 08/16/2022   FERRITIN 26 07/04/2022   FERRITIN 31 05/24/2022   IRONPCTSAT 11 (L) 08/16/2022   IRONPCTSAT 30 07/04/2022   IRONPCTSAT 28 05/24/2022   Lab Results  Component Value Date   LDH 138 08/16/2022   LDH 129 05/18/2021   LDH 131 01/12/2021     STUDIES:   No results found.

## 2022-09-13 ENCOUNTER — Encounter: Payer: Self-pay | Admitting: Hematology

## 2022-09-13 ENCOUNTER — Inpatient Hospital Stay: Payer: Medicare PPO | Attending: Hematology | Admitting: Hematology

## 2022-09-13 ENCOUNTER — Inpatient Hospital Stay: Payer: Medicare PPO

## 2022-09-13 VITALS — BP 132/58 | HR 87 | Temp 97.6°F | Resp 18 | Wt 203.0 lb

## 2022-09-13 DIAGNOSIS — E785 Hyperlipidemia, unspecified: Secondary | ICD-10-CM | POA: Insufficient documentation

## 2022-09-13 DIAGNOSIS — M1990 Unspecified osteoarthritis, unspecified site: Secondary | ICD-10-CM | POA: Insufficient documentation

## 2022-09-13 DIAGNOSIS — K219 Gastro-esophageal reflux disease without esophagitis: Secondary | ICD-10-CM | POA: Diagnosis not present

## 2022-09-13 DIAGNOSIS — Z79899 Other long term (current) drug therapy: Secondary | ICD-10-CM | POA: Diagnosis not present

## 2022-09-13 DIAGNOSIS — D649 Anemia, unspecified: Secondary | ICD-10-CM

## 2022-09-13 DIAGNOSIS — E559 Vitamin D deficiency, unspecified: Secondary | ICD-10-CM | POA: Diagnosis not present

## 2022-09-13 DIAGNOSIS — I739 Peripheral vascular disease, unspecified: Secondary | ICD-10-CM | POA: Insufficient documentation

## 2022-09-13 DIAGNOSIS — I2510 Atherosclerotic heart disease of native coronary artery without angina pectoris: Secondary | ICD-10-CM | POA: Diagnosis not present

## 2022-09-13 DIAGNOSIS — Z87891 Personal history of nicotine dependence: Secondary | ICD-10-CM | POA: Insufficient documentation

## 2022-09-13 DIAGNOSIS — Z7982 Long term (current) use of aspirin: Secondary | ICD-10-CM | POA: Insufficient documentation

## 2022-09-13 LAB — CBC WITH DIFFERENTIAL/PLATELET
Abs Immature Granulocytes: 0.04 10*3/uL (ref 0.00–0.07)
Basophils Absolute: 0.1 10*3/uL (ref 0.0–0.1)
Basophils Relative: 1 %
Eosinophils Absolute: 0.1 10*3/uL (ref 0.0–0.5)
Eosinophils Relative: 2 %
HCT: 19.9 % — ABNORMAL LOW (ref 39.0–52.0)
Hemoglobin: 6.1 g/dL — CL (ref 13.0–17.0)
Immature Granulocytes: 0 %
Lymphocytes Relative: 23 %
Lymphs Abs: 2.2 10*3/uL (ref 0.7–4.0)
MCH: 33.2 pg (ref 26.0–34.0)
MCHC: 30.7 g/dL (ref 30.0–36.0)
MCV: 108.2 fL — ABNORMAL HIGH (ref 80.0–100.0)
Monocytes Absolute: 0.9 10*3/uL (ref 0.1–1.0)
Monocytes Relative: 9 %
Neutro Abs: 6.3 10*3/uL (ref 1.7–7.7)
Neutrophils Relative %: 65 %
Platelets: 445 10*3/uL — ABNORMAL HIGH (ref 150–400)
RBC: 1.84 MIL/uL — ABNORMAL LOW (ref 4.22–5.81)
RDW: 22.5 % — ABNORMAL HIGH (ref 11.5–15.5)
WBC: 9.6 10*3/uL (ref 4.0–10.5)
nRBC: 0.5 % — ABNORMAL HIGH (ref 0.0–0.2)

## 2022-09-13 LAB — TYPE AND SCREEN
ABO/RH(D): B POS
Unit division: 0

## 2022-09-13 LAB — BPAM RBC
Blood Product Expiration Date: 202406132359
Unit Type and Rh: 1700
Unit Type and Rh: 9500

## 2022-09-13 LAB — PREPARE RBC (CROSSMATCH)

## 2022-09-13 NOTE — Progress Notes (Unsigned)
CRITICAL VALUE ALERT Critical value received:  hgb 6.1 Date of notification:  09-13-22 Time of notification: 1444 Critical value read back:  Yes.   Nurse who received alert:  C. Daison Braxton RN MD notified time and response:  1145, Dr. Ellin Saba.   Will give 2 units of blood tomorrow per MD.

## 2022-09-13 NOTE — Patient Instructions (Addendum)
[facility]  Discharge Instructions  You were seen and examined today by Dr. Ellin Saba.  Your hemoglobin is quite low. Dr. Ellin Saba will arrange for you to have a blood transfusion.  Your multiple myeloma tests were negative. There is no issue arising in the bone marrow that has caused your hemoglobin to be low.  Continue holding Jadenu. Also hold Vitamin D. You are losing blood through your stool. We will request that you see a GI doctor ASAP.  Follow-up as scheduled.  Thank you for choosing [facility] to provide your oncology and hematology care.   To afford each patient quality time with our provider, please arrive at least 15 minutes before your scheduled appointment time. You may need to reschedule your appointment if you arrive late (10 or more minutes). Arriving late affects you and other patients whose appointments are after yours.  Also, if you miss three or more appointments without notifying the office, you may be dismissed from the clinic at the provider's discretion.    Again, thank you for choosing [facility].  Our hope is that these requests will decrease the amount of time that you wait before being seen by our physicians.   If you have a lab appointment with the Cancer Center - please note that after April 8th, all labs will be drawn in the cancer center.  You do not have to check in or register with the main entrance as you have in the past but will complete your check-in at the cancer center.            _____________________________________________________________  Should you have questions after your visit to [facility], please contact our office at [phone] and follow the prompts.  Our office hours are 8:00 a.m. to 4:30 p.m. Monday - Thursday and 8:00 a.m. to 2:30 p.m. Friday.  Please note that voicemails left after 4:00 p.m. may not be returned until the following business day.   We are closed weekends and all major holidays.  You do have access to a nurse 24-7, just call the main number to the clinic [phone] and do not press any options, hold on the line and a nurse will answer the phone.    For prescription refill requests, have your pharmacy contact our office and allow 72 hours.    Masks are no longer required in the cancer centers. If you would like for your care team to wear a mask while they are taking care of you, please let them know. You may have one support person who is at least 79 years old accompany you for your appointments.

## 2022-09-14 ENCOUNTER — Inpatient Hospital Stay: Payer: Medicare PPO

## 2022-09-14 DIAGNOSIS — E559 Vitamin D deficiency, unspecified: Secondary | ICD-10-CM | POA: Diagnosis not present

## 2022-09-14 DIAGNOSIS — E785 Hyperlipidemia, unspecified: Secondary | ICD-10-CM | POA: Diagnosis not present

## 2022-09-14 DIAGNOSIS — Z7982 Long term (current) use of aspirin: Secondary | ICD-10-CM | POA: Diagnosis not present

## 2022-09-14 DIAGNOSIS — Z87891 Personal history of nicotine dependence: Secondary | ICD-10-CM | POA: Diagnosis not present

## 2022-09-14 DIAGNOSIS — I739 Peripheral vascular disease, unspecified: Secondary | ICD-10-CM | POA: Diagnosis not present

## 2022-09-14 DIAGNOSIS — I2510 Atherosclerotic heart disease of native coronary artery without angina pectoris: Secondary | ICD-10-CM | POA: Diagnosis not present

## 2022-09-14 DIAGNOSIS — K219 Gastro-esophageal reflux disease without esophagitis: Secondary | ICD-10-CM | POA: Diagnosis not present

## 2022-09-14 DIAGNOSIS — D649 Anemia, unspecified: Secondary | ICD-10-CM

## 2022-09-14 DIAGNOSIS — M1990 Unspecified osteoarthritis, unspecified site: Secondary | ICD-10-CM | POA: Diagnosis not present

## 2022-09-14 LAB — BPAM RBC
Blood Product Expiration Date: 202406132359
ISSUE DATE / TIME: 202405091345

## 2022-09-14 LAB — TYPE AND SCREEN
Antibody Screen: NEGATIVE
Unit division: 0

## 2022-09-14 MED ORDER — SODIUM CHLORIDE 0.9% IV SOLUTION
250.0000 mL | Freq: Once | INTRAVENOUS | Status: AC
  Administered 2022-09-14: 250 mL via INTRAVENOUS

## 2022-09-14 MED ORDER — ACETAMINOPHEN 325 MG PO TABS
650.0000 mg | ORAL_TABLET | Freq: Once | ORAL | Status: AC
  Administered 2022-09-14: 650 mg via ORAL
  Filled 2022-09-14: qty 2

## 2022-09-14 MED ORDER — DIPHENHYDRAMINE HCL 25 MG PO CAPS
25.0000 mg | ORAL_CAPSULE | Freq: Once | ORAL | Status: AC
  Administered 2022-09-14: 25 mg via ORAL
  Filled 2022-09-14: qty 1

## 2022-09-14 NOTE — Progress Notes (Signed)
Patient presents today for 2UPRBC per providers order.  Vital signs WNL.  Patient has no new complaints at this time.  Peripheral IV started and blood return noted pre and post infusion.  Blood transfusion without adverse affects.  Vital signs stable.  No complaints at this time.  Discharge from clinic ambulatory in stable condition.  Alert and oriented X 3.  Follow up with [facility] as scheduled.  

## 2022-09-14 NOTE — Patient Instructions (Signed)
[facility]  Discharge Instructions: Thank you for choosing [facility] to provide your oncology and hematology care.  If you have a lab appointment with the Cancer Center - please note that after April 8th, 2024, all labs will be drawn in the cancer center.  You do not have to check in or register with the main entrance as you have in the past but will complete your check-in in the cancer center.  Wear comfortable clothing and clothing appropriate for easy access to any Portacath or PICC line.   We strive to give you quality time with your provider. You may need to reschedule your appointment if you arrive late (15 or more minutes).  Arriving late affects you and other patients whose appointments are after yours.  Also, if you miss three or more appointments without notifying the office, you may be dismissed from the clinic at the provider's discretion.      For prescription refill requests, have your pharmacy contact our office and allow 72 hours for refills to be completed.    Today you received the following chemotherapy and/or immunotherapy agents 2 Units of packed red blood cells      To help prevent nausea and vomiting after your treatment, we encourage you to take your nausea medication as directed.  BELOW ARE SYMPTOMS THAT SHOULD BE REPORTED IMMEDIATELY: *FEVER GREATER THAN 100.4 F (38 C) OR HIGHER *CHILLS OR SWEATING *NAUSEA AND VOMITING THAT IS NOT CONTROLLED WITH YOUR NAUSEA MEDICATION *UNUSUAL SHORTNESS OF BREATH *UNUSUAL BRUISING OR BLEEDING *URINARY PROBLEMS (pain or burning when urinating, or frequent urination) *BOWEL PROBLEMS (unusual diarrhea, constipation, pain near the anus) TENDERNESS IN MOUTH AND THROAT WITH OR WITHOUT PRESENCE OF ULCERS (sore throat, sores in mouth, or a toothache) UNUSUAL RASH, SWELLING OR PAIN  UNUSUAL VAGINAL DISCHARGE OR ITCHING   Items with * indicate a potential emergency and should be followed up as soon as  possible or go to the Emergency Department if any problems should occur.  Please show the CHEMOTHERAPY ALERT CARD or IMMUNOTHERAPY ALERT CARD at check-in to the Emergency Department and triage nurse.  Should you have questions after your visit or need to cancel or reschedule your appointment, please contact [facility] [phone]  and follow the prompts.  Office hours are 8:00 a.m. to 4:30 p.m. Monday - Friday. Please note that voicemails left after 4:00 p.m. may not be returned until the following business day.  We are closed weekends and major holidays. You have access to a nurse at all times for urgent questions. Please call the main number to the clinic [phone] and follow the prompts.  For any non-urgent questions, you may also contact your provider using MyChart. We now offer e-Visits for anyone 1 and older to request care online for non-urgent symptoms. For details visit [email or web address].   Also download the MyChart app! Go to the app store, search "MyChart", open the app, select , and log in with your MyChart username and password.

## 2022-09-15 LAB — BPAM RBC
ISSUE DATE / TIME: 202405091210
Unit Type and Rh: 1700
Unit Type and Rh: 9500

## 2022-09-15 LAB — TYPE AND SCREEN

## 2022-09-26 ENCOUNTER — Other Ambulatory Visit (HOSPITAL_COMMUNITY): Payer: Self-pay

## 2022-10-03 NOTE — Progress Notes (Signed)
[facility] [address] [place] [address]    Clinic Day:  10/04/2022  Referring physician: Karie Schwalbe, MD  Patient Care Team: Karie Schwalbe, MD as PCP - General (Internal Medicine) Wyline Mood Dorothe Pea, MD as PCP - Cardiology (Cardiology) Doreatha Massed, MD as Medical Oncologist (Hematology)   ASSESSMENT & PLAN:   Assessment: 1.  Hereditary hemochromatosis: -Patient seen for elevated ferritin levels. -EMR evaluation shows hemochromatosis testing on 01/27/2019 which showed heterozygosity for C282Y and H63D variants. -Given the elevated ferritin levels above 1000 and the results of genetic testing, this is compatible with hereditary hemochromatosis. -Patient does have arthritis of the small joints of the hands and arthritis of the back. -No history of CHF but has CAD.  No diabetes. -MRI of the liver on 12/11/2019 shows estimated liver iron concentration approximately 7.7 mg/g compatible with moderate liver iron.  Estimated fat fraction ranges from 7-12%, more pronounced in the right lobe compared to left hepatic lobe.  No focal hepatic lesions seen.  No morphological changes of significant liver disease. -Jadenu 540 mg daily started on 12/24/2019, dose increased to 720 mg daily on 02/09/2020.  - MRI liver (09/21/2021): Diminished liver iron concentration less than 2 mg/g, slightly below "mild" designation (previously well into the "moderate" range). - Dose decreased to 2 tablets daily on 07/04/2022   2.  Macrocytic anemia: -Colonoscopy by Dr. Myrtie Neither on 02/17/2019 shows diverticulosis in the left colon, 12 mm polyp in the cecum, 4 to 6 mm polyp in the ascending colon and 5 mm polyp in the descending colon.  Pathology was consistent with tubular adenomas.    Plan: 1.  Hereditary hemochromatosis: - Jadenu on hold since 09/13/2022. - Ferritin today is 81 with percent saturation 49, after transfusion of 2 units PRBC. - I will continue to hold Jadenu at this  time.  Will repeat ferritin and iron panel at next visit.   2.  Macrocytic anemia: - Previous nutritional deficiency workup was negative.  Combination anemia from blood loss and medication induced.  Stool cards x 2 were positive. - He received 2 units PRBC on 09/13/2022.  Energy levels improved. - CBC today with hemoglobin 8.4 and MCV 102. - He has an appointment to see GI on 10/20/2022. - Will monitor him in 4 weeks with repeat CBC.   3.  Vitamin D deficiency: - Last vitamin D was 108.  Hold weekly vitamin D level.    Orders Placed This Encounter  Procedures   CBC with Differential/Platelet    Standing Status:   Future    Standing Expiration Date:   10/04/2023    Order Specific Question:   Release to patient    Answer:   Immediate   Ferritin    Standing Status:   Future    Standing Expiration Date:   10/04/2023    Order Specific Question:   Release to patient    Answer:   Immediate   Iron and TIBC    Standing Status:   Future    Standing Expiration Date:   10/04/2023    Order Specific Question:   Release to patient    Answer:   Immediate   Reticulocytes    Standing Status:   Future    Standing Expiration Date:   10/04/2023   Sample to Blood Bank(Blood Bank Hold)    Standing Status:   Future    Standing Expiration Date:   10/04/2023      I,Katie Daubenspeck,acting as a  scribe for Doreatha Massed, MD.,have documented all relevant documentation on the behalf of Doreatha Massed, MD,as directed by  Doreatha Massed, MD while in the presence of Doreatha Massed, MD.   I, Doreatha Massed MD, have reviewed the above documentation for accuracy and completeness, and I agree with the above.   Doreatha Massed, MD   5/29/20245:46 PM  CHIEF COMPLAINT:   Diagnosis: hereditary hemochromatosis    Cancer Staging  No matching staging information was found for the patient.   Prior Therapy: none  Current Therapy:  Deferasirox 560 mg daily    HISTORY OF PRESENT  ILLNESS:   Oncology History   No history exists.     INTERVAL HISTORY:   Zailyn is a 77 y.o. male presenting to clinic today for follow up of hereditary hemochromatosis. He was last seen by me on 09/13/22.  Today, he states that he is doing well overall. His appetite level is at 100%. His energy level is at 40%.  PAST MEDICAL HISTORY:   Past Medical History: 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   Cerebrovascular disease    Moderate ASVD without focal stenosis in 01/2007   Colon polyps 07/2010   single 2mm polyp--tubular adenoma   Diverticulosis 2012   found on colonoscopy   Erectile dysfunction    Family history of adverse reaction to anesthesia    difficult for son & sistor to wake    GERD (gastroesophageal reflux disease)    Hematochezia 05/24/2010   Hyperlipidemia    Hypertension    Osteoarthritis    knees/hands-Dr Sherlean Foot   Overweight(278.02)    Peripheral vascular disease (HCC) 03/11/2010   Moderate SFA stenosis; history of claudication   Pneumonia    Rosacea    Tobacco abuse, in remission    30-40 pack years discontinued in 1993   Vitamin B12 deficiency     Surgical History: Past Surgical History:  Procedure Laterality Date   APPENDECTOMY     APPLICATION OF WOUND VAC Left 12/17/2021   Procedure: APPLICATION OF PREVENA WOUND VAC LEFT GROIN;  Surgeon: Victorino Sparrow, MD;  Location: [facility] OR;  Service: Vascular;  Laterality: Left;   APPLICATION OF WOUND VAC Left 01/08/2022   Procedure: APPLICATION OF WOUND VAC left groin.;  Surgeon: Cephus Shelling, MD;  Location: [facility] OR;  Service: Vascular;  Laterality: Left;   COLONOSCOPY W/ POLYPECTOMY  2012   ENDARTERECTOMY FEMORAL Left 12/09/2021   Procedure: LEFT COMMON FEMORAL ENDARTERECTOMY WITH 1 CM X 6 CM XENOSURE BOVINE PATCH ANGIOPLASTY;  Surgeon: Cephus Shelling, MD;  Location: [facility] OR;  Service:  Vascular;  Laterality: Left;   FEMORAL-POPLITEAL BYPASS GRAFT Left 12/09/2021   Procedure: LEFT FEMORAL- ABOVE KNEE POPLITEAL BYPASS WITH 6 mm x 80 cm PROPATEN GRAFT;  Surgeon: Cephus Shelling, MD;  Location: [facility] OR;  Service: Vascular;  Laterality: Left;  INSERT ARTERIAL LINE   ILIAC VEIN ANGIOPLASTY / STENTING Right 08/09/2015   INCISION AND DRAINAGE OF WOUND Left 01/08/2022   Procedure: IRRIGATION AND DEBRIDEMENT GROIN LEFT application of Myriad Morcells.;  Surgeon: Cephus Shelling, MD;  Location: [facility] OR;  Service: Vascular;  Laterality: Left;   INSERTION OF ILIAC STENT  12/09/2021   Procedure: INSERTION OF LEFT COMMON AND EXTERNAL  ILIAC STENTS WITH ANGIOPLASTY;  Surgeon: Cephus Shelling, MD;  Location: [facility] OR;  Service: Vascular;;   LOWER EXTREMITY ANGIOGRAM Left 12/09/2021  Procedure: LEFT ILIAC AND LOWER EXTREMITY ANGIOGRAM;  Surgeon: Cephus Shelling, MD;  Location: [facility] OR;  Service: Vascular;  Laterality: Left;   LOWER EXTREMITY ANGIOGRAPHY Bilateral 11/03/2021   Procedure: Lower Extremity Angiography;  Surgeon: Runell Gess, MD;  Location: [facility] INVASIVE CV LAB;  Service: Cardiovascular;  Laterality: Bilateral;  Limited Study   PERIPHERAL VASCULAR CATHETERIZATION Bilateral 06/21/2015   Procedure: Lower Extremity Angiography;  Surgeon: Runell Gess, MD;  Location: [facility] INVASIVE CV LAB;  Service: Cardiovascular;  Laterality: Bilateral;   PERIPHERAL VASCULAR CATHETERIZATION N/A 06/21/2015   Procedure: Abdominal Aortogram;  Surgeon: Runell Gess, MD;  Location: [facility] INVASIVE CV LAB;  Service: Cardiovascular;  Laterality: N/A;   PERIPHERAL VASCULAR CATHETERIZATION N/A 07/26/2015   Procedure: Lower Extremity Angiography;  Surgeon: Runell Gess, MD;  Location: [facility] INVASIVE CV LAB;  Service: Cardiovascular;  Laterality: N/A;   PERIPHERAL VASCULAR CATHETERIZATION N/A 08/09/2015   Procedure: Lower Extremity Angiography;  Surgeon: Runell Gess, MD;  Location: [facility] INVASIVE CV LAB;   Service: Cardiovascular;  Laterality: N/A;   PERIPHERAL VASCULAR CATHETERIZATION  08/09/2015   Procedure: Peripheral Vascular Intervention;  Surgeon: Runell Gess, MD;  Location: [facility] INVASIVE CV LAB;  Service: Cardiovascular;;  rt ext. iliac atherectomy and stent   PERIPHERAL VASCULAR CATHETERIZATION N/A 09/20/2015   Procedure: Lower Extremity Angiography;  Surgeon: Runell Gess, MD;  Location: [facility] INVASIVE CV LAB;  Service: Cardiovascular;  Laterality: N/A;   PERIPHERAL VASCULAR CATHETERIZATION Right 09/20/2015   Procedure: Peripheral Vascular Intervention;  Surgeon: Runell Gess, MD;  Location[facility] INVASIVE CV LAB;  Service: Cardiovascular;  Laterality: Right;  SFA   ROTATOR CUFF REPAIR Left 7/15   Dr Sherlean Foot   WOUND EXPLORATION Left 12/17/2021   Procedure: LEFT LEG WOUND EXPLORATION AND WASH OUT WITH MYRIAD MORCELLS;  Surgeon: Victorino Sparrow, MD;  Location: [facility] OR;  Service: Vascular;  Laterality: Left;    Social History: Social History   Socioeconomic History   Marital status: Married    Spouse name: Not on file   Number of children: 3   Years of education: Not on file   Highest education level: Not on file  Occupational History   Occupation: Managed supply chain--- retired 2013    Comment: [employer]  Tobacco Use   Smoking status: Former    Packs/day: 1.00    Years: 30.00    Additional pack years: 0.00    Total pack years: 30.00    Types: Cigarettes    Quit date: 05/16/1990    Years since quitting: 32.4    Passive exposure: Never   Smokeless tobacco: Never   Tobacco comments:    Quit in 1993  Vaping Use   Vaping Use: Never used  Substance and Sexual Activity   Alcohol use: No    Alcohol/week: 0.0 standard drinks of alcohol   Drug use: No   Sexual activity: Not on file  Other Topics Concern   Not on file  Social History Narrative   Has living will   Wife is health care POA---alternate is son or daughter   Would accept resuscitation but no  prolonged artificial ventilation.   No tube feeds if cognitively unaware   Social Determinants of Health   Financial Resource Strain: Low Risk  (03/10/2020)   Overall Financial Resource Strain (CARDIA)    Difficulty of Paying Living Expenses: Not hard at all  Food Insecurity: No Food Insecurity (01/13/2022)   Hunger Vital Sign    Worried About  Running Out of Food in the Last Year: Never true    Ran Out of Food in the Last Year: Never true  Transportation Needs: No Transportation Needs (01/13/2022)   PRAPARE - Administrator, Civil Service (Medical): No    Lack of Transportation (Non-Medical): No  Physical Activity: Inactive (03/10/2020)   Exercise Vital Sign    Days of Exercise per Week: 0 days    Minutes of Exercise per Session: 0 min  Stress: No Stress Concern Present (03/10/2020)   [employer] of Occupational Health - Occupational Stress Questionnaire    Feeling of Stress : Not at all  Social Connections: Moderately Integrated (03/10/2020)   Social Connection and Isolation Panel [NHANES]    Frequency of Communication with Friends and Family: More than three times a week    Frequency of Social Gatherings with Friends and Family: Never    Attends Religious Services: More than 4 times per year    Active Member of [employer] or Organizations: No    Attends Banker Meetings: Never    Marital Status: Married  Catering manager Violence: Not At Risk (03/10/2020)   Humiliation, Afraid, Rape, and Kick questionnaire    Fear of Current or Ex-Partner: No    Emotionally Abused: No    Physically Abused: No    Sexually Abused: No    Family History: Family History  Problem Relation Age of Onset   Hypertension Father        And siblings   Heart disease Father        And second-degree relatives   Transient ischemic attack Father    Lung cancer Mother    Diabetes Mother    Brain cancer Sister    Diabetes Brother    Atrial fibrillation Brother    Heart disease Sister     Heart disease Sister    Thyroid disease Sister    Ulcers Son    Diabetes Son    Diabetes Son     Current Medications:  Current Outpatient Medications:    aspirin 81 MG tablet, Take 81 mg by mouth every evening., Disp: , Rfl:    chlorthalidone (HYGROTON) 25 MG tablet, TAKE 1/2 TABLET(12.5 MG) BY MOUTH DAILY, Disp: 30 tablet, Rfl: 3   clopidogrel (PLAVIX) 75 MG tablet, TAKE 1 TABLET(75 MG) BY MOUTH DAILY WITH BREAKFAST, Disp: 30 tablet, Rfl: 9   Cyanocobalamin (VITAMIN B-12) 500 MCG SUBL, Place 500 mcg under the tongue daily., Disp: , Rfl:    loperamide (IMODIUM A-D) 2 MG tablet, Take 2 mg by mouth 4 (four) times daily as needed for diarrhea or loose stools., Disp: , Rfl:    losartan (COZAAR) 25 MG tablet, Take 1 tablet (25 mg total) by mouth daily., Disp: 90 tablet, Rfl: 3   metoprolol succinate (TOPROL-XL) 100 MG 24 hr tablet, TAKE 1 TABLET(100 MG) BY MOUTH DAILY WITH OR IMMEDIATELY FOLLOWING A MEAL, Disp: 30 tablet, Rfl: 0   nitroGLYCERIN (NITROSTAT) 0.4 MG SL tablet, Place 1 tablet (0.4 mg total) under the tongue every 5 (five) minutes as needed for chest pain., Disp: 25 tablet, Rfl: 1   Omega-3 Fatty Acids (FISH OIL) 1000 MG CAPS, Take 1,000 mg by mouth daily., Disp: , Rfl:    pravastatin (PRAVACHOL) 40 MG tablet, TAKE 1 TABLET(40 MG) BY MOUTH DAILY, Disp: 90 tablet, Rfl: 3   traMADol (ULTRAM) 50 MG tablet, TAKE 1 TABLET BY MOUTH EVERY 8 HOURS AS NEEDED, Disp: 90 tablet, Rfl: 0   Vitamin D,  Ergocalciferol, (DRISDOL) 1.25 MG (50000 UNIT) CAPS capsule, TAKE 1 CAPSULE BY MOUTH 1 TIME A WEEK, Disp: 12 capsule, Rfl: 2   Allergies: Allergies  Allergen Reactions   Cardizem [Diltiazem Hcl] Hives and Rash    REVIEW OF SYSTEMS:   Review of Systems  Constitutional:  Negative for chills, fatigue and fever.  HENT:   Negative for lump/mass, mouth sores, nosebleeds, sore throat and trouble swallowing.   Eyes:  Negative for eye problems.  Respiratory:  Positive for cough. Negative for  shortness of breath.   Cardiovascular:  Negative for chest pain, leg swelling and palpitations.  Gastrointestinal:  Negative for abdominal pain, constipation, diarrhea, nausea and vomiting.  Genitourinary:  Negative for bladder incontinence, difficulty urinating, dysuria, frequency, hematuria and nocturia.   Musculoskeletal:  Negative for arthralgias, back pain, flank pain, myalgias and neck pain.  Skin:  Negative for itching and rash.  Neurological:  Positive for numbness. Negative for dizziness and headaches.  Hematological:  Does not bruise/bleed easily.  Psychiatric/Behavioral:  Negative for depression, sleep disturbance and suicidal ideas. The patient is not nervous/anxious.   All other systems reviewed and are negative.    VITALS:   Blood pressure (!) 139/58, pulse 78, temperature 98.2 F (36.8 C), temperature source Oral, resp. rate 18, weight 202 lb (91.6 kg), SpO2 99 %.  Wt Readings from Last 3 Encounters:  10/04/22 202 lb (91.6 kg)  09/13/22 203 lb (92.1 kg)  07/04/22 200 lb 4.8 oz (90.9 kg)    Body mass index is 31.64 kg/m.  Performance status (ECOG): 1 - Symptomatic but completely ambulatory  PHYSICAL EXAM:   Physical Exam Vitals and nursing note reviewed. Exam conducted with a chaperone present.  Constitutional:      Appearance: Normal appearance.  Cardiovascular:     Rate and Rhythm: Normal rate and regular rhythm.     Pulses: Normal pulses.     Heart sounds: Normal heart sounds.  Pulmonary:     Effort: Pulmonary effort is normal.     Breath sounds: Normal breath sounds.  Abdominal:     Palpations: Abdomen is soft. There is no hepatomegaly, splenomegaly or mass.     Tenderness: There is no abdominal tenderness.  Musculoskeletal:     Right lower leg: No edema.     Left lower leg: No edema.  Lymphadenopathy:     Cervical: No cervical adenopathy.     Right cervical: No superficial, deep or posterior cervical adenopathy.    Left cervical: No superficial, deep  or posterior cervical adenopathy.     Upper Body:     Right upper body: No supraclavicular or axillary adenopathy.     Left upper body: No supraclavicular or axillary adenopathy.  Neurological:     General: No focal deficit present.     Mental Status: He is alert and oriented to person, place, and time.  Psychiatric:        Mood and Affect: Mood normal.        Behavior: Behavior normal.     LABS:      Latest Ref Rng & Units 10/04/2022    1:01 PM 09/13/2022    2:08 PM 08/16/2022    2:06 PM  CBC  WBC 4.0 - 10.5 K/uL 7.2  9.6  9.5   Hemoglobin 13.0 - 17.0 g/dL 8.4  6.1  7.8   Hematocrit 39.0 - 52.0 % 26.1  19.9  24.0   Platelets 150 - 400 K/uL 372  445  413  Latest Ref Rng & Units 08/16/2022    2:06 PM 07/04/2022    2:29 PM 05/24/2022    1:51 PM  CMP  Glucose 70 - 99 mg/dL 90  161  98   BUN 8 - 23 mg/dL 23  23  21    Creatinine 0.61 - 1.24 mg/dL 0.96  0.45  4.09   Sodium 135 - 145 mmol/L 132  132  136   Potassium 3.5 - 5.1 mmol/L 3.7  3.5  3.7   Chloride 98 - 111 mmol/L 99  99  103   CO2 22 - 32 mmol/L 22  22  25    Calcium 8.9 - 10.3 mg/dL 9.0  8.7  9.0   Total Protein 6.5 - 8.1 g/dL 7.7  7.5  7.7   Total Bilirubin 0.3 - 1.2 mg/dL 0.7  0.5  0.4   Alkaline Phos 38 - 126 U/L 50  50  52   AST 15 - 41 U/L 16  16  17    ALT 0 - 44 U/L 12  12  12       No results found for: "CEA1", "CEA" / No results found for: "CEA1", "CEA" No results found for: "PSA1" No results found for: "CAN199" No results found for: "CAN125"  Lab Results  Component Value Date   TOTALPROTELP 7.3 08/16/2022   ALBUMINELP 4.0 08/16/2022   A1GS 0.2 08/16/2022   A2GS 0.7 08/16/2022   BETS 1.0 08/16/2022   GAMS 1.3 08/16/2022   MSPIKE Not Observed 08/16/2022   SPEI Comment 08/16/2022   Lab Results  Component Value Date   TIBC 263 10/04/2022   TIBC 350 08/16/2022   TIBC 322 07/04/2022   FERRITIN 81 10/04/2022   FERRITIN 21 (L) 08/16/2022   FERRITIN 26 07/04/2022   IRONPCTSAT 49 (H) 10/04/2022    IRONPCTSAT 11 (L) 08/16/2022   IRONPCTSAT 30 07/04/2022   Lab Results  Component Value Date   LDH 126 10/04/2022   LDH 138 08/16/2022   LDH 129 05/18/2021     STUDIES:   No results found.

## 2022-10-04 ENCOUNTER — Inpatient Hospital Stay: Payer: Medicare PPO | Admitting: Hematology

## 2022-10-04 ENCOUNTER — Inpatient Hospital Stay: Payer: Medicare PPO

## 2022-10-04 ENCOUNTER — Other Ambulatory Visit: Payer: Self-pay | Admitting: Cardiology

## 2022-10-04 DIAGNOSIS — D649 Anemia, unspecified: Secondary | ICD-10-CM | POA: Diagnosis not present

## 2022-10-04 DIAGNOSIS — E559 Vitamin D deficiency, unspecified: Secondary | ICD-10-CM | POA: Diagnosis not present

## 2022-10-04 DIAGNOSIS — I739 Peripheral vascular disease, unspecified: Secondary | ICD-10-CM | POA: Diagnosis not present

## 2022-10-04 DIAGNOSIS — K219 Gastro-esophageal reflux disease without esophagitis: Secondary | ICD-10-CM | POA: Diagnosis not present

## 2022-10-04 DIAGNOSIS — I2510 Atherosclerotic heart disease of native coronary artery without angina pectoris: Secondary | ICD-10-CM | POA: Diagnosis not present

## 2022-10-04 DIAGNOSIS — M1990 Unspecified osteoarthritis, unspecified site: Secondary | ICD-10-CM | POA: Diagnosis not present

## 2022-10-04 DIAGNOSIS — Z87891 Personal history of nicotine dependence: Secondary | ICD-10-CM | POA: Diagnosis not present

## 2022-10-04 DIAGNOSIS — E785 Hyperlipidemia, unspecified: Secondary | ICD-10-CM | POA: Diagnosis not present

## 2022-10-04 DIAGNOSIS — Z7982 Long term (current) use of aspirin: Secondary | ICD-10-CM | POA: Diagnosis not present

## 2022-10-04 LAB — CBC WITH DIFFERENTIAL/PLATELET
Abs Immature Granulocytes: 0.01 10*3/uL (ref 0.00–0.07)
Basophils Absolute: 0 10*3/uL (ref 0.0–0.1)
Basophils Relative: 1 %
Eosinophils Absolute: 0.3 10*3/uL (ref 0.0–0.5)
Eosinophils Relative: 4 %
HCT: 26.1 % — ABNORMAL LOW (ref 39.0–52.0)
Hemoglobin: 8.4 g/dL — ABNORMAL LOW (ref 13.0–17.0)
Immature Granulocytes: 0 %
Lymphocytes Relative: 33 %
Lymphs Abs: 2.4 10*3/uL (ref 0.7–4.0)
MCH: 32.8 pg (ref 26.0–34.0)
MCHC: 32.2 g/dL (ref 30.0–36.0)
MCV: 102 fL — ABNORMAL HIGH (ref 80.0–100.0)
Monocytes Absolute: 0.8 10*3/uL (ref 0.1–1.0)
Monocytes Relative: 11 %
Neutro Abs: 3.8 10*3/uL (ref 1.7–7.7)
Neutrophils Relative %: 51 %
Platelets: 372 10*3/uL (ref 150–400)
RBC: 2.56 MIL/uL — ABNORMAL LOW (ref 4.22–5.81)
RDW: 21.8 % — ABNORMAL HIGH (ref 11.5–15.5)
WBC: 7.2 10*3/uL (ref 4.0–10.5)
nRBC: 0.4 % — ABNORMAL HIGH (ref 0.0–0.2)

## 2022-10-04 LAB — IRON AND TIBC
Iron: 129 ug/dL (ref 45–182)
Saturation Ratios: 49 % — ABNORMAL HIGH (ref 17.9–39.5)
TIBC: 263 ug/dL (ref 250–450)
UIBC: 134 ug/dL

## 2022-10-04 LAB — LACTATE DEHYDROGENASE: LDH: 126 U/L (ref 98–192)

## 2022-10-04 LAB — SAMPLE TO BLOOD BANK

## 2022-10-04 LAB — FERRITIN: Ferritin: 81 ng/mL (ref 24–336)

## 2022-10-04 NOTE — Patient Instructions (Signed)
[facility]  Discharge Instructions  You were seen and examined today by Dr. Ellin Saba.  Dr. Ellin Saba discussed your most recent lab work which revealed that everything looks good.  Continue to hold the Jadenu.   Follow-up as scheduled in 4 weeks.    Thank you for choosing [facility] to provide your oncology and hematology care.   To afford each patient quality time with our provider, please arrive at least 15 minutes before your scheduled appointment time. You may need to reschedule your appointment if you arrive late (10 or more minutes). Arriving late affects you and other patients whose appointments are after yours.  Also, if you miss three or more appointments without notifying the office, you may be dismissed from the clinic at the provider's discretion.    Again, thank you for choosing [facility].  Our hope is that these requests will decrease the amount of time that you wait before being seen by our physicians.   If you have a lab appointment with the Cancer Center - please note that after April 8th, all labs will be drawn in the cancer center.  You do not have to check in or register with the main entrance as you have in the past but will complete your check-in at the cancer center.            _____________________________________________________________  Should you have questions after your visit to [facility], please contact our office at [phone] and follow the prompts.  Our office hours are 8:00 a.m. to 4:30 p.m. Monday - Thursday and 8:00 a.m. to 2:30 p.m. Friday.  Please note that voicemails left after 4:00 p.m. may not be returned until the following business day.  We are closed weekends and all major holidays.  You do have access to a nurse 24-7, just call the main number to the clinic [phone] and do not press any options, hold on the line and a nurse will answer the phone.     For prescription refill requests, have your pharmacy contact our office and allow 72 hours.    Masks are no longer required in the cancer centers. If you would like for your care team to wear a mask while they are taking care of you, please let them know. You may have one support person who is at least 79 years old accompany you for your appointments.

## 2022-10-06 ENCOUNTER — Other Ambulatory Visit: Payer: Self-pay

## 2022-10-06 ENCOUNTER — Inpatient Hospital Stay: Payer: Medicare PPO

## 2022-10-06 LAB — ERYTHROPOIETIN: Erythropoietin: 54.4 m[IU]/mL — ABNORMAL HIGH (ref 2.6–18.5)

## 2022-10-11 LAB — IMMUNOFIXATION ELECTROPHORESIS
IgA: 317 mg/dL (ref 61–437)
IgG (Immunoglobin G), Serum: 1661 mg/dL — ABNORMAL HIGH (ref 603–1613)
IgM (Immunoglobulin M), Srm: 70 mg/dL (ref 15–143)
Total Protein ELP: 7.2 g/dL (ref 6.0–8.5)

## 2022-10-20 ENCOUNTER — Ambulatory Visit: Payer: Medicare PPO | Admitting: Physician Assistant

## 2022-10-27 ENCOUNTER — Ambulatory Visit: Payer: Medicare PPO | Admitting: Gastroenterology

## 2022-10-27 ENCOUNTER — Encounter: Payer: Self-pay | Admitting: Gastroenterology

## 2022-10-27 VITALS — BP 140/60 | HR 86 | Ht 67.0 in | Wt 205.0 lb

## 2022-10-27 DIAGNOSIS — Z86010 Personal history of colonic polyps: Secondary | ICD-10-CM

## 2022-10-27 DIAGNOSIS — Z7902 Long term (current) use of antithrombotics/antiplatelets: Secondary | ICD-10-CM | POA: Diagnosis not present

## 2022-10-27 DIAGNOSIS — R195 Other fecal abnormalities: Secondary | ICD-10-CM | POA: Diagnosis not present

## 2022-10-27 DIAGNOSIS — D649 Anemia, unspecified: Secondary | ICD-10-CM | POA: Diagnosis not present

## 2022-10-27 MED ORDER — NA SULFATE-K SULFATE-MG SULF 17.5-3.13-1.6 GM/177ML PO SOLN: 1.0000 | Freq: Once | ORAL | 0 refills | Status: AC

## 2022-10-27 NOTE — Progress Notes (Signed)
____________________________________________________________  Attending physician addendum:  Thank you for sending this case to me. I have reviewed the entire note and agree with the plan.   Espn Zeman Danis, MD  ____________________________________________________________  

## 2022-10-27 NOTE — Progress Notes (Signed)
10/27/2022 Aaron Sexton [id number] 10/29/1945   HISTORY OF PRESENT ILLNESS: This is a 77 year old male who is a patient Aaron Sexton.  He had a colonoscopy here in October 2020 at which time he had some polyps removed including a large polyp was greater than 10 mm.  These were all tubular adenomas.  Due to age no repeat procedure was recommended.  He has had chronic anemia, but recently his hemoglobin dipped down to 6.1 g.  This required 2 units of packed red blood cells and now is up to 8.4 g.  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  He had Hemoccults performed and 2 out of 3 of those were positive for microscopic blood.  He denies any dark or bloody stools.  No abdominal pain.  He says that his bowel habits alternate between normal to constipation to diarrhea, and they have done that for a long time.  He denies any heartburn or reflux or indigestion.  He does not use NSAIDs.  No dysphagia.  Appetite is good.  He is on Plavix as prescribed by Dr. [name].  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   Cerebrovascular disease    Moderate ASVD without focal stenosis in 01/2007   Colon polyps 07/2010   single 2mm polyp--tubular adenoma   Diverticulosis 2012   found on colonoscopy   Erectile dysfunction    Family history of adverse reaction to anesthesia    difficult for son & sistor to wake    GERD (gastroesophageal reflux disease)    Hematochezia 05/24/2010   Hyperlipidemia    Hypertension    Osteoarthritis    knees/hands-Dr Sherlean Foot   Overweight(278.02)    Peripheral vascular disease (HCC) 03/11/2010   Moderate SFA stenosis; history of claudication   Pneumonia    Rosacea    Tobacco abuse, in remission    30-40 pack years discontinued in 1993   Vitamin B12 deficiency    Past Surgical History:  Procedure Laterality Date   APPENDECTOMY     APPLICATION OF WOUND  VAC Left 12/17/2021   Procedure: APPLICATION OF PREVENA WOUND VAC LEFT GROIN;  Surgeon: Victorino Sparrow, MD;  Location: [facility] OR;  Service: Vascular;  Laterality: Left;   APPLICATION OF WOUND VAC Left 01/08/2022   Procedure: APPLICATION OF WOUND VAC left groin.;  Surgeon: Cephus Shelling, MD;  Location: [facility] OR;  Service: Vascular;  Laterality: Left;   COLONOSCOPY W/ POLYPECTOMY  2012   ENDARTERECTOMY FEMORAL Left 12/09/2021   Procedure: LEFT COMMON FEMORAL ENDARTERECTOMY WITH 1 CM X 6 CM XENOSURE BOVINE PATCH ANGIOPLASTY;  Surgeon: Cephus Shelling, MD;  Location: [facility] OR;  Service: Vascular;  Laterality: Left;   FEMORAL-POPLITEAL BYPASS GRAFT Left 12/09/2021   Procedure: LEFT FEMORAL- ABOVE KNEE POPLITEAL BYPASS WITH 6 mm x 80 cm PROPATEN GRAFT;  Surgeon: Cephus Shelling, MD;  Location: [facility] OR;  Service: Vascular;  Laterality: Left;  INSERT ARTERIAL LINE   ILIAC VEIN ANGIOPLASTY / STENTING Right 08/09/2015   INCISION AND DRAINAGE OF WOUND Left 01/08/2022   Procedure: IRRIGATION AND DEBRIDEMENT GROIN LEFT application of Myriad Morcells.;  Surgeon: Cephus Shelling, MD;  Location: [facility] OR;  Service: Vascular;  Laterality: Left;   INSERTION OF ILIAC STENT  12/09/2021   Procedure: INSERTION OF LEFT COMMON AND EXTERNAL  ILIAC STENTS WITH ANGIOPLASTY;  Surgeon: Cephus Shelling, MD;  Location:  [facility] OR;  Service: Vascular;;   LOWER EXTREMITY ANGIOGRAM Left 12/09/2021   Procedure: LEFT ILIAC AND LOWER EXTREMITY ANGIOGRAM;  Surgeon: Cephus Shelling, MD;  Location: [facility] OR;  Service: Vascular;  Laterality: Left;   LOWER EXTREMITY ANGIOGRAPHY Bilateral 11/03/2021   Procedure: Lower Extremity Angiography;  Surgeon: Runell Gess, MD;  Location: [facility] INVASIVE CV LAB;  Service: Cardiovascular;  Laterality: Bilateral;  Limited Study   PERIPHERAL VASCULAR CATHETERIZATION Bilateral 06/21/2015   Procedure: Lower Extremity Angiography;  Surgeon: Runell Gess, MD;  Location: [facility] INVASIVE CV LAB;  Service:  Cardiovascular;  Laterality: Bilateral;   PERIPHERAL VASCULAR CATHETERIZATION N/A 06/21/2015   Procedure: Abdominal Aortogram;  Surgeon: Runell Gess, MD;  Location: [facility] INVASIVE CV LAB;  Service: Cardiovascular;  Laterality: N/A;   PERIPHERAL VASCULAR CATHETERIZATION N/A 07/26/2015   Procedure: Lower Extremity Angiography;  Surgeon: Runell Gess, MD;  Location: [facility] INVASIVE CV LAB;  Service: Cardiovascular;  Laterality: N/A;   PERIPHERAL VASCULAR CATHETERIZATION N/A 08/09/2015   Procedure: Lower Extremity Angiography;  Surgeon: Runell Gess, MD;  Location: [facility] INVASIVE CV LAB;  Service: Cardiovascular;  Laterality: N/A;   PERIPHERAL VASCULAR CATHETERIZATION  08/09/2015   Procedure: Peripheral Vascular Intervention;  Surgeon: Runell Gess, MD;  Location: [facility] INVASIVE CV LAB;  Service: Cardiovascular;;  rt ext. iliac atherectomy and stent   PERIPHERAL VASCULAR CATHETERIZATION N/A 09/20/2015   Procedure: Lower Extremity Angiography;  Surgeon: Runell Gess, MD;  Location: [facility] INVASIVE CV LAB;  Service: Cardiovascular;  Laterality: N/A;   PERIPHERAL VASCULAR CATHETERIZATION Right 09/20/2015   Procedure: Peripheral Vascular Intervention;  Surgeon: Runell Gess, MD;  Location: [facility] INVASIVE CV LAB;  Service: Cardiovascular;  Laterality: Right;  SFA   ROTATOR CUFF REPAIR Left 7/15   Dr Sherlean Foot   WOUND EXPLORATION Left 12/17/2021   Procedure: LEFT LEG WOUND EXPLORATION AND WASH OUT WITH MYRIAD MORCELLS;  Surgeon: Victorino Sparrow, MD;  Location: [facility] OR;  Service: Vascular;  Laterality: Left;    reports that he quit smoking about 32 years ago. His smoking use included cigarettes. He has a 30.00 pack-year smoking history. He has never been exposed to tobacco smoke. He has never used smokeless tobacco. He reports that he does not drink alcohol and does not use drugs. family history includes Atrial fibrillation in his brother; Brain cancer in his sister; Diabetes in his brother, mother, son, and son;  Heart disease in his father, sister, and sister; Hypertension in his father; Lung cancer in his mother; Thyroid disease in his sister; Transient ischemic attack in his father; Ulcers in his son. Allergies  Allergen Reactions   Cardizem [Diltiazem Hcl] Hives and Rash      Outpatient Encounter Medications as of 10/27/2022  Medication Sig   aspirin 81 MG tablet Take 81 mg by mouth every evening.   chlorthalidone (HYGROTON) 25 MG tablet TAKE 1/2 TABLET(12.5 MG) BY MOUTH DAILY   clopidogrel (PLAVIX) 75 MG tablet TAKE 1 TABLET(75 MG) BY MOUTH DAILY WITH BREAKFAST   Cyanocobalamin (VITAMIN B-12) 500 MCG SUBL Place 500 mcg under the tongue daily.   loperamide (IMODIUM A-D) 2 MG tablet Take 2 mg by mouth 4 (four) times daily as needed for diarrhea or loose stools.   losartan (COZAAR) 25 MG tablet Take 1 tablet (25 mg total) by mouth daily.   metoprolol succinate (TOPROL-XL) 100 MG 24 hr tablet TAKE 1 TABLET(100 MG) BY MOUTH DAILY WITH OR IMMEDIATELY FOLLOWING A MEAL   nitroGLYCERIN (NITROSTAT) 0.4 MG SL tablet  Place 1 tablet (0.4 mg total) under the tongue every 5 (five) minutes as needed for chest pain.   Omega-3 Fatty Acids (FISH OIL) 1000 MG CAPS Take 1,000 mg by mouth daily.   pravastatin (PRAVACHOL) 40 MG tablet TAKE 1 TABLET(40 MG) BY MOUTH DAILY   traMADol (ULTRAM) 50 MG tablet TAKE 1 TABLET BY MOUTH EVERY 8 HOURS AS NEEDED   Vitamin D, Ergocalciferol, (DRISDOL) 1.25 MG (50000 UNIT) CAPS capsule TAKE 1 CAPSULE BY MOUTH 1 TIME A WEEK (Patient not taking: Reported on 10/27/2022)   No facility-administered encounter medications on file as of 10/27/2022.     REVIEW OF SYSTEMS  : All other systems reviewed and negative except where noted in the History of Present Illness.   PHYSICAL EXAM: BP (!) 140/60 (BP Location: Left Arm, Patient Position: Sitting, Cuff Size: Normal)   Pulse 86   Ht 5\' 7"  (1.702 m)   Wt 205 lb (93 kg)   SpO2 94%   BMI 32.11 kg/m  General: Well developed white male in  no acute distress Head: Normocephalic and atraumatic Eyes:  Sclerae anicteric, conjunctiva pink. Ears: Normal auditory acuity Lungs: Clear throughout to auscultation; no W/R/R. Heart: Regular rate and rhythm; no M/R/G. Rectal:  Will be done at the time of colonoscopy. Musculoskeletal: Symmetrical with no gross deformities  Skin: No lesions on visible extremities Extremities: No edema  Neurological: Alert oriented x 4, grossly non-focal Psychological:  Alert and cooperative. Normal mood and affect  ASSESSMENT AND PLAN: *Anemia and heme positive stools: Longstanding anemia, but recently blood counts down to 6.1 g and required 2 units packed red blood cells.  No overt GI bleeding, but heme positive x 2.  Hemoglobin now back up to 8.4 g.  Last colonoscopy in October 2020 with several polyps removed, one large (all tubular adenomas).  Will plan for EGD and colonoscopy with Dr. Myrtie Neither. *Chronic antiplatelet use with Plavix:  Hold Plavix for 5 days before procedure - will instruct when and how to resume after procedure. Risks and benefits of procedure including bleeding, perforation, infection, missed lesions, medication reactions and possible hospitalization or surgery if complications occur explained. Additional rare but real risk of cardiovascular event such as heart attack or ischemia/infarct of other organs off of Plavix explained and need to seek urgent help if this occurs. Will communicate by phone or EMR with patient's prescribing provider, Dr. Allyson Sabal, to confirm that holding Plavix is reasonable in this case.     CC:  Karie Schwalbe, MD

## 2022-10-27 NOTE — Patient Instructions (Signed)
You have been scheduled for an endoscopy and colonoscopy. Please follow the written instructions given to you at your visit today. Please pick up your prep supplies at the pharmacy within the next 1-3 days. If you use inhalers (even only as needed), please bring them with you on the day of your procedure.  You will be contacted by our office prior to your procedure for directions on holding your Plavix.  If you do not hear from our office 1 week prior to your scheduled procedure, please call [phone] to discuss.   _______________________________________________________  If your blood pressure at your visit was 140/90 or greater, please contact your primary care physician to follow up on this.  _______________________________________________________  If you are age 5 or older, your body mass index should be between 23-30. Your Body mass index is 32.11 kg/m. If this is out of the aforementioned range listed, please consider follow up with your Primary Care Provider.  If you are age 39 or younger, your body mass index should be between 19-25. Your Body mass index is 32.11 kg/m. If this is out of the aformentioned range listed, please consider follow up with your Primary Care Provider.   ________________________________________________________  The [place] [facility] providers would like to encourage you to use [facility] to communicate with providers for non-urgent requests or questions.  Due to long hold times on the telephone, sending your provider a message by [facility] may be a faster and more efficient way to get a response.  Please allow 48 business hours for a response.  Please remember that this is for non-urgent requests.  _______________________________________________________

## 2022-10-30 ENCOUNTER — Other Ambulatory Visit: Payer: Self-pay | Admitting: Internal Medicine

## 2022-10-30 NOTE — Telephone Encounter (Signed)
Last filled 08-08-22 #90 Last OV 02-09-22 Next OV 02-12-23 [facility] [address]

## 2022-11-01 ENCOUNTER — Other Ambulatory Visit: Payer: Self-pay | Admitting: Cardiology

## 2022-11-01 ENCOUNTER — Telehealth: Payer: Self-pay | Admitting: Cardiovascular Disease

## 2022-11-01 NOTE — Telephone Encounter (Signed)
*  STAT* If patient is at the pharmacy, call can be transferred to refill team.   1. Which medications need to be refilled? (please list name of each medication and dose if known)   metoprolol succinate (TOPROL-XL) 100 MG 24 hr tablet    2. Which pharmacy/location (including street and city if local pharmacy) is medication to be sent to?  [id number] - [place], [place] - [address] AT [address] OF [address]      3. Do they need a 30 day or 90 day supply? 90 day   Pt is completely out of medication and has OV appt 11/08/2022

## 2022-11-01 NOTE — Telephone Encounter (Signed)
Refilled earlier today by electronic request from [facility]

## 2022-11-03 ENCOUNTER — Telehealth: Payer: Self-pay | Admitting: *Deleted

## 2022-11-03 NOTE — Telephone Encounter (Signed)
[facility] Pre-operative Risk Assessment     Request for surgical clearance:     Endoscopy Procedure  What type of surgery is being performed?     Egd/colon  When is this surgery scheduled?     11/30/22  What type of clearance is required ?   Pharmacy  Are there any medications that need to be held prior to surgery and how long? Plavix 5 days  Practice name and name of physician performing surgery?      [facility]  What is your office phone and fax number?      Phone- [phone]  Fax- [phone]  Anesthesia type (None, local, MAC, general) ?       MAC

## 2022-11-03 NOTE — Telephone Encounter (Addendum)
Patient Name: Aaron Sexton  DOB: 1946-02-25 MRN: [id number]  Primary Cardiologist: Dina Rich, MD  Chart reviewed as part of pre-operative protocol coverage.   Patient can hold  Plavix  5 days prior to procedure and should restart postprocedure when surgically safe and hemostasis is achieved.  Regarding ASA therapy, we recommend continuation of ASA throughout the perioperative period.  However, if the surgeon feels that cessation of ASA is required in the perioperative period, it may be stopped 5-7 days prior to surgery with a plan to resume it as soon as felt to be feasible from a surgical standpoint in the post-operative period.    Napoleon Form, Leodis Rains, NP 11/03/2022, 10:06 AM

## 2022-11-05 NOTE — Progress Notes (Signed)
[facility] [address] [place] [address]    Clinic Day:  11/06/2022  Referring physician: Karie Schwalbe, MD  Patient Care Team: Karie Schwalbe, MD as PCP - General (Internal Medicine) Wyline Mood Dorothe Pea, MD as PCP - Cardiology (Cardiology) Doreatha Massed, MD as Medical Oncologist (Hematology)   ASSESSMENT & PLAN:   Assessment: 1.  Hereditary hemochromatosis: -Patient seen for elevated ferritin levels. -EMR evaluation shows hemochromatosis testing on 01/27/2019 which showed heterozygosity for C282Y and H63D variants. -Given the elevated ferritin levels above 1000 and the results of genetic testing, this is compatible with hereditary hemochromatosis. -Patient does have arthritis of the small joints of the hands and arthritis of the back. -No history of CHF but has CAD.  No diabetes. -MRI of the liver on 12/11/2019 shows estimated liver iron concentration approximately 7.7 mg/g compatible with moderate liver iron.  Estimated fat fraction ranges from 7-12%, more pronounced in the right lobe compared to left hepatic lobe.  No focal hepatic lesions seen.  No morphological changes of significant liver disease. -Jadenu 540 mg daily started on 12/24/2019, dose increased to 720 mg daily on 02/09/2020.  - MRI liver (09/21/2021): Diminished liver iron concentration less than 2 mg/g, slightly below "mild" designation (previously well into the "moderate" range). - Dose decreased to 2 tablets daily on 07/04/2022   2.  Macrocytic anemia: -Colonoscopy by Dr. Myrtie Neither on 02/17/2019 shows diverticulosis in the left colon, 12 mm polyp in the cecum, 4 to 6 mm polyp in the ascending colon and 5 mm polyp in the descending colon.  Pathology was consistent with tubular adenomas.    Plan: 1.  Hereditary hemochromatosis: - Jadenu on hold since 09/13/2022. - Ferritin today is 88 and percent saturation is 66. - Continue to hold Jadenu.   2.  Macrocytic anemia: - Previous  nutritional deficiency workup is negative.  Stool cards x 2 were positive. - Myeloma workup was negative. - Anemia secondary to blood loss.  He will have EGD/colonoscopy by Dr. Myrtie Neither on 11/30/2022. - If there is no convincing etiology of blood loss after EGD/colonoscopy, will consider bone marrow biopsy. - CBC today shows hemoglobin 8.1, down from 8.4 on 10/04/2022.  Recommend 1 unit PRBC tomorrow as he has history of angina. - RTC 5 weeks for follow-up.   3.  Vitamin D deficiency: - Last vitamin D level was 108.  We held his weekly vitamin D levels.    Orders Placed This Encounter  Procedures   CBC with Differential    Standing Status:   Future    Standing Expiration Date:   11/06/2023   Iron and TIBC ([facility] ONLY)    Standing Status:   Future    Standing Expiration Date:   11/06/2023   Ferritin    Standing Status:   Future    Standing Expiration Date:   11/06/2023   Lactate dehydrogenase    Standing Status:   Future    Standing Expiration Date:   11/06/2023   Reticulocytes    Standing Status:   Future    Standing Expiration Date:   11/06/2023      I,Katie Daubenspeck,acting as a scribe for Doreatha Massed, MD.,have documented all relevant documentation on the behalf of Doreatha Massed, MD,as directed by  Doreatha Massed, MD while in the presence of Doreatha Massed, MD.   I, Doreatha Massed MD, have reviewed the above documentation for accuracy and completeness, and I agree with the above.   Doreatha Massed,  MD   7/1/20243:21 PM  CHIEF COMPLAINT:   Diagnosis: hereditary hemochromatosis    Cancer Staging  No matching staging information was found for the patient.   Prior Therapy: none  Current Therapy:  Deferasirox 560 mg daily    HISTORY OF PRESENT ILLNESS:   Oncology History   No history exists.     INTERVAL HISTORY:   Aaron Sexton is a 77 y.o. male presenting to clinic today for follow up of hereditary hemochromatosis. He was  last seen by me on 10/04/22.  Today, he states that he is doing well overall. His appetite level is at 100%. His energy level is at 50%.  PAST MEDICAL HISTORY:   Past Medical History: 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   Cerebrovascular disease    Moderate ASVD without focal stenosis in 01/2007   Colon polyps 07/2010   single 2mm polyp--tubular adenoma   Diverticulosis 2012   found on colonoscopy   Erectile dysfunction    Family history of adverse reaction to anesthesia    difficult for son & sistor to wake    GERD (gastroesophageal reflux disease)    Hematochezia 05/24/2010   Hyperlipidemia    Hypertension    Osteoarthritis    knees/hands-Dr Sherlean Foot   Overweight(278.02)    Peripheral vascular disease (HCC) 03/11/2010   Moderate SFA stenosis; history of claudication   Pneumonia    Rosacea    Tobacco abuse, in remission    30-40 pack years discontinued in 1993   Vitamin B12 deficiency     Surgical History: Past Surgical History:  Procedure Laterality Date   APPENDECTOMY     APPLICATION OF WOUND VAC Left 12/17/2021   Procedure: APPLICATION OF PREVENA WOUND VAC LEFT GROIN;  Surgeon: Victorino Sparrow, MD;  Location: [facility] OR;  Service: Vascular;  Laterality: Left;   APPLICATION OF WOUND VAC Left 01/08/2022   Procedure: APPLICATION OF WOUND VAC left groin.;  Surgeon: Cephus Shelling, MD;  Location: [facility] OR;  Service: Vascular;  Laterality: Left;   COLONOSCOPY W/ POLYPECTOMY  2012   ENDARTERECTOMY FEMORAL Left 12/09/2021   Procedure: LEFT COMMON FEMORAL ENDARTERECTOMY WITH 1 CM X 6 CM XENOSURE BOVINE PATCH ANGIOPLASTY;  Surgeon: Cephus Shelling, MD;  Location: [facility] OR;  Service: Vascular;  Laterality: Left;   FEMORAL-POPLITEAL BYPASS GRAFT Left 12/09/2021   Procedure: LEFT FEMORAL- ABOVE KNEE POPLITEAL BYPASS WITH 6 mm x 80 cm PROPATEN GRAFT;  Surgeon: Cephus Shelling, MD;  Location: [facility] OR;  Service: Vascular;  Laterality: Left;  INSERT ARTERIAL LINE   ILIAC VEIN ANGIOPLASTY / STENTING Right 08/09/2015   INCISION AND DRAINAGE OF WOUND Left 01/08/2022   Procedure: IRRIGATION AND DEBRIDEMENT GROIN LEFT application of Myriad Morcells.;  Surgeon: Cephus Shelling, MD;  Location: [facility] OR;  Service: Vascular;  Laterality: Left;   INSERTION OF ILIAC STENT  12/09/2021   Procedure: INSERTION OF LEFT COMMON AND EXTERNAL  ILIAC STENTS WITH ANGIOPLASTY;  Surgeon: Cephus Shelling, MD;  Location: [facility] OR;  Service: Vascular;;   LOWER EXTREMITY ANGIOGRAM Left 12/09/2021   Procedure: LEFT ILIAC AND LOWER EXTREMITY ANGIOGRAM;  Surgeon: Cephus Shelling, MD;  Location: [facility] OR;  Service: Vascular;  Laterality: Left;   LOWER EXTREMITY ANGIOGRAPHY Bilateral 11/03/2021   Procedure: Lower Extremity Angiography;  Surgeon: Runell Gess, MD;  Location: [facility] INVASIVE CV LAB;  Service: Cardiovascular;  Laterality:  Bilateral;  Limited Study   PERIPHERAL VASCULAR CATHETERIZATION Bilateral 06/21/2015   Procedure: Lower Extremity Angiography;  Surgeon: Runell Gess, MD;  Location: [facility] INVASIVE CV LAB;  Service: Cardiovascular;  Laterality: Bilateral;   PERIPHERAL VASCULAR CATHETERIZATION N/A 06/21/2015   Procedure: Abdominal Aortogram;  Surgeon: Runell Gess, MD;  Location: [facility] INVASIVE CV LAB;  Service: Cardiovascular;  Laterality: N/A;   PERIPHERAL VASCULAR CATHETERIZATION N/A 07/26/2015   Procedure: Lower Extremity Angiography;  Surgeon: Runell Gess, MD;  Location: [facility] INVASIVE CV LAB;  Service: Cardiovascular;  Laterality: N/A;   PERIPHERAL VASCULAR CATHETERIZATION N/A 08/09/2015   Procedure: Lower Extremity Angiography;  Surgeon: Runell Gess, MD;  Location: [facility] INVASIVE CV LAB;  Service: Cardiovascular;  Laterality: N/A;   PERIPHERAL VASCULAR CATHETERIZATION  08/09/2015   Procedure: Peripheral Vascular Intervention;  Surgeon: Runell Gess, MD;  Location: [facility]  INVASIVE CV LAB;  Service: Cardiovascular;;  rt ext. iliac atherectomy and stent   PERIPHERAL VASCULAR CATHETERIZATION N/A 09/20/2015   Procedure: Lower Extremity Angiography;  Surgeon: Runell Gess, MD;  Location: [facility] INVASIVE CV LAB;  Service: Cardiovascular;  Laterality: N/A;   PERIPHERAL VASCULAR CATHETERIZATION Right 09/20/2015   Procedure: Peripheral Vascular Intervention;  Surgeon: Runell Gess, MD;  Location: [facility] INVASIVE CV LAB;  Service: Cardiovascular;  Laterality: Right;  SFA   ROTATOR CUFF REPAIR Left 7/15   Dr Sherlean Foot   WOUND EXPLORATION Left 12/17/2021   Procedure: LEFT LEG WOUND EXPLORATION AND WASH OUT WITH MYRIAD MORCELLS;  Surgeon: Victorino Sparrow, MD;  Location: [facility] OR;  Service: Vascular;  Laterality: Left;    Social History: Social History   Socioeconomic History   Marital status: Married    Spouse name: Not on file   Number of children: 3   Years of education: Not on file   Highest education level: Not on file  Occupational History   Occupation: Managed supply chain--- retired 2013    Comment: [employer]  Tobacco Use   Smoking status: Former    Packs/day: 1.00    Years: 30.00    Additional pack years: 0.00    Total pack years: 30.00    Types: Cigarettes    Quit date: 05/16/1990    Years since quitting: 32.4    Passive exposure: Never   Smokeless tobacco: Never   Tobacco comments:    Quit in 1993  Vaping Use   Vaping Use: Never used  Substance and Sexual Activity   Alcohol use: No    Alcohol/week: 0.0 standard drinks of alcohol   Drug use: No   Sexual activity: Not on file  Other Topics Concern   Not on file  Social History Narrative   Has living will   Wife is health care POA---alternate is son or daughter   Would accept resuscitation but no prolonged artificial ventilation.   No tube feeds if cognitively unaware   Social Determinants of Health   Financial Resource Strain: Low Risk  (03/10/2020)   Overall Financial Resource  Strain (CARDIA)    Difficulty of Paying Living Expenses: Not hard at all  Food Insecurity: No Food Insecurity (01/13/2022)   Hunger Vital Sign    Worried About Running Out of Food in the Last Year: Never true    Ran Out of Food in the Last Year: Never true  Transportation Needs: No Transportation Needs (01/13/2022)   PRAPARE - Administrator, Civil Service (Medical): No    Lack of Transportation (Non-Medical): No  Physical  Activity: Inactive (03/10/2020)   Exercise Vital Sign    Days of Exercise per Week: 0 days    Minutes of Exercise per Session: 0 min  Stress: No Stress Concern Present (03/10/2020)   [employer] of Occupational Health - Occupational Stress Questionnaire    Feeling of Stress : Not at all  Social Connections: Moderately Integrated (03/10/2020)   Social Connection and Isolation Panel [NHANES]    Frequency of Communication with Friends and Family: More than three times a week    Frequency of Social Gatherings with Friends and Family: Never    Attends Religious Services: More than 4 times per year    Active Member of [employer] or Organizations: No    Attends Banker Meetings: Never    Marital Status: Married  Catering manager Violence: Not At Risk (03/10/2020)   Humiliation, Afraid, Rape, and Kick questionnaire    Fear of Current or Ex-Partner: No    Emotionally Abused: No    Physically Abused: No    Sexually Abused: No    Family History: Family History  Problem Relation Age of Onset   Hypertension Father        And siblings   Heart disease Father        And second-degree relatives   Transient ischemic attack Father    Lung cancer Mother    Diabetes Mother    Brain cancer Sister    Diabetes Brother    Atrial fibrillation Brother    Heart disease Sister    Heart disease Sister    Thyroid disease Sister    Ulcers Son    Diabetes Son    Diabetes Son     Current Medications:  Current Outpatient Medications:    aspirin 81 MG tablet,  Take 81 mg by mouth every evening., Disp: , Rfl:    chlorthalidone (HYGROTON) 25 MG tablet, TAKE 1/2 TABLET(12.5 MG) BY MOUTH DAILY, Disp: 30 tablet, Rfl: 3   clopidogrel (PLAVIX) 75 MG tablet, TAKE 1 TABLET(75 MG) BY MOUTH DAILY WITH BREAKFAST, Disp: 30 tablet, Rfl: 9   Cyanocobalamin (VITAMIN B-12) 500 MCG SUBL, Place 500 mcg under the tongue daily., Disp: , Rfl:    loperamide (IMODIUM A-D) 2 MG tablet, Take 2 mg by mouth 4 (four) times daily as needed for diarrhea or loose stools., Disp: , Rfl:    losartan (COZAAR) 25 MG tablet, Take 1 tablet (25 mg total) by mouth daily., Disp: 90 tablet, Rfl: 3   metoprolol succinate (TOPROL-XL) 100 MG 24 hr tablet, TAKE 1 TABLET(100 MG) BY MOUTH DAILY WITH OR IMMEDIATELY FOLLOWING A MEAL, Disp: 30 tablet, Rfl: 1   nitroGLYCERIN (NITROSTAT) 0.4 MG SL tablet, Place 1 tablet (0.4 mg total) under the tongue every 5 (five) minutes as needed for chest pain., Disp: 25 tablet, Rfl: 1   Omega-3 Fatty Acids (FISH OIL) 1000 MG CAPS, Take 1,000 mg by mouth daily., Disp: , Rfl:    pravastatin (PRAVACHOL) 40 MG tablet, TAKE 1 TABLET(40 MG) BY MOUTH DAILY, Disp: 90 tablet, Rfl: 3   traMADol (ULTRAM) 50 MG tablet, TAKE 1 TABLET BY MOUTH EVERY 8 HOURS AS NEEDED, Disp: 90 tablet, Rfl: 0   Allergies: Allergies  Allergen Reactions   Cardizem [Diltiazem Hcl] Hives and Rash    REVIEW OF SYSTEMS:   Review of Systems  Constitutional:  Positive for fatigue. Negative for chills and fever.  HENT:   Negative for lump/mass, mouth sores, nosebleeds, sore throat and trouble swallowing.   Eyes:  Negative  for eye problems.  Respiratory:  Negative for cough and shortness of breath.   Cardiovascular:  Negative for chest pain, leg swelling and palpitations.  Gastrointestinal:  Negative for abdominal pain, constipation, diarrhea, nausea and vomiting.  Genitourinary:  Negative for bladder incontinence, difficulty urinating, dysuria, frequency, hematuria and nocturia.   Musculoskeletal:   Negative for arthralgias, back pain, flank pain, myalgias and neck pain.  Skin:  Negative for itching and rash.  Neurological:  Positive for numbness. Negative for dizziness and headaches.  Hematological:  Does not bruise/bleed easily.  Psychiatric/Behavioral:  Negative for depression, sleep disturbance and suicidal ideas. The patient is not nervous/anxious.   All other systems reviewed and are negative.    VITALS:   Blood pressure 139/61, pulse 78, temperature 98 F (36.7 C), resp. rate 18, weight 204 lb 8 oz (92.8 kg), SpO2 94 %.  Wt Readings from Last 3 Encounters:  11/06/22 204 lb 8 oz (92.8 kg)  10/27/22 205 lb (93 kg)  10/04/22 202 lb (91.6 kg)    Body mass index is 32.03 kg/m.  Performance status (ECOG): 1 - Symptomatic but completely ambulatory  PHYSICAL EXAM:   Physical Exam Vitals and nursing note reviewed. Exam conducted with a chaperone present.  Constitutional:      Appearance: Normal appearance.  Cardiovascular:     Rate and Rhythm: Normal rate and regular rhythm.     Pulses: Normal pulses.     Heart sounds: Normal heart sounds.  Pulmonary:     Effort: Pulmonary effort is normal.     Breath sounds: Normal breath sounds.  Abdominal:     Palpations: Abdomen is soft. There is no hepatomegaly, splenomegaly or mass.     Tenderness: There is no abdominal tenderness.  Musculoskeletal:     Right lower leg: No edema.     Left lower leg: No edema.  Lymphadenopathy:     Cervical: No cervical adenopathy.     Right cervical: No superficial, deep or posterior cervical adenopathy.    Left cervical: No superficial, deep or posterior cervical adenopathy.     Upper Body:     Right upper body: No supraclavicular or axillary adenopathy.     Left upper body: No supraclavicular or axillary adenopathy.  Neurological:     General: No focal deficit present.     Mental Status: He is alert and oriented to person, place, and time.  Psychiatric:        Mood and Affect: Mood  normal.        Behavior: Behavior normal.     LABS:      Latest Ref Rng & Units 11/06/2022    1:50 PM 10/04/2022    1:01 PM 09/13/2022    2:08 PM  CBC  WBC 4.0 - 10.5 K/uL 7.4  7.2  9.6   Hemoglobin 13.0 - 17.0 g/dL 8.1  8.4  6.1   Hematocrit 39.0 - 52.0 % 25.2  26.1  19.9   Platelets 150 - 400 K/uL 335  372  445       Latest Ref Rng & Units 08/16/2022    2:06 PM 07/04/2022    2:29 PM 05/24/2022    1:51 PM  CMP  Glucose 70 - 99 mg/dL 90  540  98   BUN 8 - 23 mg/dL 23  23  21    Creatinine 0.61 - 1.24 mg/dL 9.81  1.91  4.78   Sodium 135 - 145 mmol/L 132  132  136   Potassium 3.5 -  5.1 mmol/L 3.7  3.5  3.7   Chloride 98 - 111 mmol/L 99  99  103   CO2 22 - 32 mmol/L 22  22  25    Calcium 8.9 - 10.3 mg/dL 9.0  8.7  9.0   Total Protein 6.5 - 8.1 g/dL 7.7  7.5  7.7   Total Bilirubin 0.3 - 1.2 mg/dL 0.7  0.5  0.4   Alkaline Phos 38 - 126 U/L 50  50  52   AST 15 - 41 U/L 16  16  17    ALT 0 - 44 U/L 12  12  12       No results found for: "CEA1", "CEA" / No results found for: "CEA1", "CEA" No results found for: "PSA1" No results found for: "ZOX096" No results found for: "CAN125"  Lab Results  Component Value Date   TOTALPROTELP 7.2 10/04/2022   ALBUMINELP 4.0 08/16/2022   A1GS 0.2 08/16/2022   A2GS 0.7 08/16/2022   BETS 1.0 08/16/2022   GAMS 1.3 08/16/2022   MSPIKE Not Observed 08/16/2022   SPEI Comment 08/16/2022   Lab Results  Component Value Date   TIBC 241 (L) 11/06/2022   TIBC 263 10/04/2022   TIBC 350 08/16/2022   FERRITIN 88 11/06/2022   FERRITIN 81 10/04/2022   FERRITIN 21 (L) 08/16/2022   IRONPCTSAT 66 (H) 11/06/2022   IRONPCTSAT 49 (H) 10/04/2022   IRONPCTSAT 11 (L) 08/16/2022   Lab Results  Component Value Date   LDH 126 10/04/2022   LDH 138 08/16/2022   LDH 129 05/18/2021     STUDIES:   No results found.

## 2022-11-06 ENCOUNTER — Inpatient Hospital Stay: Payer: Medicare PPO | Admitting: Hematology

## 2022-11-06 ENCOUNTER — Inpatient Hospital Stay: Payer: Medicare PPO | Attending: Hematology | Admitting: Hematology

## 2022-11-06 ENCOUNTER — Encounter: Payer: Self-pay | Admitting: Hematology

## 2022-11-06 DIAGNOSIS — Z800 Family history of malignant neoplasm of digestive organs: Secondary | ICD-10-CM | POA: Diagnosis not present

## 2022-11-06 DIAGNOSIS — I739 Peripheral vascular disease, unspecified: Secondary | ICD-10-CM | POA: Diagnosis not present

## 2022-11-06 DIAGNOSIS — E538 Deficiency of other specified B group vitamins: Secondary | ICD-10-CM | POA: Diagnosis not present

## 2022-11-06 DIAGNOSIS — Z87891 Personal history of nicotine dependence: Secondary | ICD-10-CM | POA: Diagnosis not present

## 2022-11-06 DIAGNOSIS — Z801 Family history of malignant neoplasm of trachea, bronchus and lung: Secondary | ICD-10-CM | POA: Insufficient documentation

## 2022-11-06 DIAGNOSIS — E785 Hyperlipidemia, unspecified: Secondary | ICD-10-CM | POA: Diagnosis not present

## 2022-11-06 DIAGNOSIS — Z7902 Long term (current) use of antithrombotics/antiplatelets: Secondary | ICD-10-CM | POA: Diagnosis not present

## 2022-11-06 DIAGNOSIS — M1990 Unspecified osteoarthritis, unspecified site: Secondary | ICD-10-CM | POA: Diagnosis not present

## 2022-11-06 DIAGNOSIS — Z7982 Long term (current) use of aspirin: Secondary | ICD-10-CM | POA: Diagnosis not present

## 2022-11-06 DIAGNOSIS — K219 Gastro-esophageal reflux disease without esophagitis: Secondary | ICD-10-CM | POA: Insufficient documentation

## 2022-11-06 DIAGNOSIS — E559 Vitamin D deficiency, unspecified: Secondary | ICD-10-CM | POA: Diagnosis not present

## 2022-11-06 DIAGNOSIS — Z79899 Other long term (current) drug therapy: Secondary | ICD-10-CM | POA: Diagnosis not present

## 2022-11-06 DIAGNOSIS — I2510 Atherosclerotic heart disease of native coronary artery without angina pectoris: Secondary | ICD-10-CM | POA: Insufficient documentation

## 2022-11-06 DIAGNOSIS — I10 Essential (primary) hypertension: Secondary | ICD-10-CM | POA: Insufficient documentation

## 2022-11-06 DIAGNOSIS — D500 Iron deficiency anemia secondary to blood loss (chronic): Secondary | ICD-10-CM | POA: Diagnosis not present

## 2022-11-06 DIAGNOSIS — D649 Anemia, unspecified: Secondary | ICD-10-CM

## 2022-11-06 DIAGNOSIS — D539 Nutritional anemia, unspecified: Secondary | ICD-10-CM | POA: Insufficient documentation

## 2022-11-06 LAB — CBC WITH DIFFERENTIAL/PLATELET
Abs Immature Granulocytes: 0.02 10*3/uL (ref 0.00–0.07)
Basophils Absolute: 0 10*3/uL (ref 0.0–0.1)
Basophils Relative: 1 %
Eosinophils Absolute: 0.3 10*3/uL (ref 0.0–0.5)
Eosinophils Relative: 3 %
HCT: 25.2 % — ABNORMAL LOW (ref 39.0–52.0)
Hemoglobin: 8.1 g/dL — ABNORMAL LOW (ref 13.0–17.0)
Immature Granulocytes: 0 %
Lymphocytes Relative: 30 %
Lymphs Abs: 2.3 10*3/uL (ref 0.7–4.0)
MCH: 33.9 pg (ref 26.0–34.0)
MCHC: 32.1 g/dL (ref 30.0–36.0)
MCV: 105.4 fL — ABNORMAL HIGH (ref 80.0–100.0)
Monocytes Absolute: 0.9 10*3/uL (ref 0.1–1.0)
Monocytes Relative: 12 %
Neutro Abs: 4 10*3/uL (ref 1.7–7.7)
Neutrophils Relative %: 54 %
Platelets: 335 10*3/uL (ref 150–400)
RBC: 2.39 MIL/uL — ABNORMAL LOW (ref 4.22–5.81)
RDW: 24.8 % — ABNORMAL HIGH (ref 11.5–15.5)
WBC: 7.4 10*3/uL (ref 4.0–10.5)
nRBC: 0.8 % — ABNORMAL HIGH (ref 0.0–0.2)

## 2022-11-06 LAB — RETICULOCYTES
Immature Retic Fract: 16.8 % — ABNORMAL HIGH (ref 2.3–15.9)
RBC.: 2.39 MIL/uL — ABNORMAL LOW (ref 4.22–5.81)
Retic Count, Absolute: 30.8 10*3/uL (ref 19.0–186.0)
Retic Ct Pct: 1.3 % (ref 0.4–3.1)

## 2022-11-06 LAB — IRON AND TIBC
Iron: 159 ug/dL (ref 45–182)
Saturation Ratios: 66 % — ABNORMAL HIGH (ref 17.9–39.5)
TIBC: 241 ug/dL — ABNORMAL LOW (ref 250–450)
UIBC: 82 ug/dL

## 2022-11-06 LAB — FERRITIN: Ferritin: 88 ng/mL (ref 24–336)

## 2022-11-06 LAB — TYPE AND SCREEN

## 2022-11-06 LAB — PREPARE RBC (CROSSMATCH)

## 2022-11-06 NOTE — Patient Instructions (Addendum)
[facility]  Discharge Instructions  You were seen and examined today by Dr. Ellin Saba.  Your labs are stable. Your hemoglobin is 8.1, normally we would not give you blood based on that - but due to your upcoming procedure, we will give you one unit of blood.  Continue to hold Jadenu at this time.  Follow-up as scheduled.  Thank you for choosing [facility] to provide your oncology and hematology care.   To afford each patient quality time with our provider, please arrive at least 15 minutes before your scheduled appointment time. You may need to reschedule your appointment if you arrive late (10 or more minutes). Arriving late affects you and other patients whose appointments are after yours.  Also, if you miss three or more appointments without notifying the office, you may be dismissed from the clinic at the provider's discretion.    Again, thank you for choosing [facility].  Our hope is that these requests will decrease the amount of time that you wait before being seen by our physicians.   If you have a lab appointment with the Cancer Center - please note that after April 8th, all labs will be drawn in the cancer center.  You do not have to check in or register with the main entrance as you have in the past but will complete your check-in at the cancer center.            _____________________________________________________________  Should you have questions after your visit to [facility], please contact our office at [phone] and follow the prompts.  Our office hours are 8:00 a.m. to 4:30 p.m. Monday - Thursday and 8:00 a.m. to 2:30 p.m. Friday.  Please note that voicemails left after 4:00 p.m. may not be returned until the following business day.  We are closed weekends and all major holidays.  You do have access to a nurse 24-7, just call the main number to the clinic [phone] and do not press  any options, hold on the line and a nurse will answer the phone.    For prescription refill requests, have your pharmacy contact our office and allow 72 hours.    Masks are no longer required in the cancer centers. If you would like for your care team to wear a mask while they are taking care of you, please let them know. You may have one support person who is at least 79 years old accompany you for your appointments.

## 2022-11-06 NOTE — Progress Notes (Signed)
HGB 8.1. Per Dr. Ellin Saba / Judie Petit. Edwards RN give 1 unit of blood. Appointment made for Wednesday July 3 at 08:00am. Scheduling and blood bank aware.

## 2022-11-07 ENCOUNTER — Other Ambulatory Visit: Payer: Self-pay

## 2022-11-08 ENCOUNTER — Ambulatory Visit: Payer: Medicare PPO | Attending: Student | Admitting: Student

## 2022-11-08 ENCOUNTER — Encounter: Payer: Self-pay | Admitting: Student

## 2022-11-08 ENCOUNTER — Inpatient Hospital Stay: Payer: Medicare PPO

## 2022-11-08 VITALS — BP 138/68 | HR 72 | Ht 67.0 in | Wt 208.0 lb

## 2022-11-08 DIAGNOSIS — M1990 Unspecified osteoarthritis, unspecified site: Secondary | ICD-10-CM | POA: Diagnosis not present

## 2022-11-08 DIAGNOSIS — D649 Anemia, unspecified: Secondary | ICD-10-CM

## 2022-11-08 DIAGNOSIS — K219 Gastro-esophageal reflux disease without esophagitis: Secondary | ICD-10-CM | POA: Diagnosis not present

## 2022-11-08 DIAGNOSIS — I10 Essential (primary) hypertension: Secondary | ICD-10-CM

## 2022-11-08 DIAGNOSIS — I6523 Occlusion and stenosis of bilateral carotid arteries: Secondary | ICD-10-CM

## 2022-11-08 DIAGNOSIS — D500 Iron deficiency anemia secondary to blood loss (chronic): Secondary | ICD-10-CM | POA: Diagnosis not present

## 2022-11-08 DIAGNOSIS — I739 Peripheral vascular disease, unspecified: Secondary | ICD-10-CM

## 2022-11-08 DIAGNOSIS — E538 Deficiency of other specified B group vitamins: Secondary | ICD-10-CM | POA: Diagnosis not present

## 2022-11-08 DIAGNOSIS — E785 Hyperlipidemia, unspecified: Secondary | ICD-10-CM | POA: Diagnosis not present

## 2022-11-08 DIAGNOSIS — I2510 Atherosclerotic heart disease of native coronary artery without angina pectoris: Secondary | ICD-10-CM | POA: Diagnosis not present

## 2022-11-08 DIAGNOSIS — E559 Vitamin D deficiency, unspecified: Secondary | ICD-10-CM | POA: Diagnosis not present

## 2022-11-08 DIAGNOSIS — D539 Nutritional anemia, unspecified: Secondary | ICD-10-CM | POA: Diagnosis not present

## 2022-11-08 MED ORDER — DIPHENHYDRAMINE HCL 25 MG PO CAPS
25.0000 mg | ORAL_CAPSULE | Freq: Once | ORAL | Status: AC
  Administered 2022-11-08: 25 mg via ORAL
  Filled 2022-11-08: qty 1

## 2022-11-08 MED ORDER — ACETAMINOPHEN 325 MG PO TABS
650.0000 mg | ORAL_TABLET | Freq: Once | ORAL | Status: AC
  Administered 2022-11-08: 650 mg via ORAL
  Filled 2022-11-08: qty 2

## 2022-11-08 MED ORDER — SODIUM CHLORIDE 0.9% IV SOLUTION
250.0000 mL | Freq: Once | INTRAVENOUS | Status: AC
  Administered 2022-11-08: 250 mL via INTRAVENOUS

## 2022-11-08 NOTE — Progress Notes (Signed)
Patient presents today for 1 unit of blood per provider's order. Vital signs stable and pt voiced no new complaints at this time.  Peripheral IV started with good blood return pre and post infusion.  1 unit of blood given today per MD orders. Tolerated infusion without adverse affects. Vital signs stable. No complaints at this time. Discharged from clinic via wheelchair in stable condition. Alert and oriented x 3. F/U with [facility] as scheduled.

## 2022-11-08 NOTE — Patient Instructions (Signed)
Medication Instructions:  Your physician recommends that you continue on your current medications as directed. Please refer to the Current Medication list given to you today.   Labwork: None today  Testing/Procedures: Your physician has requested that you have a carotid duplex. This test is an ultrasound of the carotid arteries in your neck. It looks at blood flow through these arteries that supply the brain with blood. Allow one hour for this exam. There are no restrictions or special instructions.   Follow-Up: 1 year  Any Other Special Instructions Will Be Listed Below (If Applicable).  If you need a refill on your cardiac medications before your next appointment, please call your pharmacy.  

## 2022-11-08 NOTE — Progress Notes (Unsigned)
Cardiology Office Note    Date:  11/09/2022  ID:  Aaron Sexton, DOB [birth year], MRN [id number] Cardiologist: Dina Rich, MD    History of Present Illness:    Aaron Sexton is a 77 y.o. male with past medical history of CAD (s/p PTCA to RCA in 1993), carotid artery stenosis, PAD (s/p stenting of right EIA in 2017 with failed attempt at left SFA stenting), hemochromatosis, anemia, HTN and HLD who presents to the office today for overdue follow-up.   He was last examined by myself in 08/2022 and denied any recent anginal symptoms but did report worsening claudication over the past few months. It was recommend that he have repeat lower extremity dopplers and also carotid dopplers given the timeframe since his last evaluation. Lower extremity dopplers showed a patent right extrailiac artery stent with progression of disease in the left extrailiac artery and follow-up angiography was recommended. This was performed in 10/2021 and showed high-grade disease and Vascular Surgery consult was recommended. He underwent angiography by Dr. Chestine Spore in 12/2021 and required left femoral endarterectomy with bovine patch and left femoral to above knee popliteal bypass. This was complicated by a nonhealing wound along his left groin which required placement of a wound VAC in 01/2022.  In the interim, the office did receive a preoperative cardiac clearance request for EGD/colonoscopy. He was cleared to hold Plavix for 5 days prior to the procedure and was recommended to continue ASA during the perioperative period.   In talking with the patient and his wife today, he reports still having pain along his lower extremities with activity and is unsure if this is due to his known PAD or arthritis. He denies any recent chest pain or dyspnea on exertion. No recent orthopnea, PND or lower extremity edema. He remains on ASA and Plavix with no reports of active bleeding. He does have anemia and is being followed by  Hematology. Received 1 unit pRBC's today. He is scheduled for an upcoming EGD/colonoscopy for further evaluation.   Studies Reviewed:   EKG: EKG is ordered today and demonstrates:   EKG Interpretation Date/Time:  Wednesday November 08 2022 15:27:59 EDT Ventricular Rate:  72 PR Interval:  174 QRS Duration:  92 QT Interval:  390 QTC Calculation: 427 R Axis:   69  Text Interpretation: Normal sinus rhythm Nonspecific T wave abnormality When compared with ECG of 09-Dec-2021 16:14, Confirmed by Randall An ([id number]) on 11/08/2022 5:23:15 PM        Echocardiogram: 09/2021 IMPRESSIONS     1. Left ventricular ejection fraction, by estimation, is 65 to 70%. The  left ventricle has normal function. The left ventricle has no regional  wall motion abnormalities. There is mild left ventricular hypertrophy.  Left ventricular diastolic parameters  are consistent with Grade I diastolic dysfunction (impaired relaxation).   2. Right ventricular systolic function is normal. The right ventricular  size is normal.   3. The mitral valve is normal in structure. No evidence of mitral valve  regurgitation. No evidence of mitral stenosis.   4. The aortic valve is tricuspid. There is mild calcification of the  aortic valve. There is mild thickening of the aortic valve. Aortic valve  regurgitation is not visualized. Aortic valve sclerosis is present, with  no evidence of aortic valve stenosis.   5. The inferior vena cava is normal in size with greater than 50%  respiratory variability, suggesting right atrial pressure of 3 mmHg.   Carotid Dopplers: 09/2021 Summary:  Right Carotid:  Velocities in the right ICA are consistent with a 1-39%  stenosis.                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   Left Carotid: Velocities in the left ICA are consistent with a 1-39%  stenosis.               Non-hemodynamically significant plaque <50% noted in the  CCA.                 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    Vertebrals:  Bilateral vertebral arteries demonstrate antegrade flow.  Subclavians: Right subclavian artery flow was disturbed. Normal flow               hemodynamics were seen in the left subclavian artery.     Physical Exam:   VS:  BP 138/68   Pulse 72   Ht 5\' 7"  (1.702 m)   Wt 208 lb (94.3 kg)   SpO2 94%   BMI 32.58 kg/m    Wt Readings from Last 3 Encounters:  11/08/22 208 lb (94.3 kg)  11/06/22 204 lb 8 oz (92.8 kg)  10/27/22 205 lb (93 kg)     GEN: Well nourished, well developed male appearing in no acute distress NECK: No JVD; No carotid bruits CARDIAC: RRR, no murmurs, rubs, gallops RESPIRATORY:  Clear to auscultation without rales, wheezing or rhonchi  ABDOMEN: Appears non-distended. No obvious abdominal masses. EXTREMITIES: No clubbing or cyanosis. Trace ankle edema bilaterally.  Distal pedal pulses are 2+ bilaterally.   Assessment and Plan:   1. CAD - He is s/p PTCA to RCA in 1993. He has not undergone recent ischemic evaluation. Echocardiogram in 09/2021 did show a preserved EF of 65 to 70% with no regional wall motion normalities as outlined above. - He denies any recent anginal symptoms. We discussed follow-up ischemic testing with either a NST or Coronary CT given the timeframe since his last evaluation but he wishes to hold off for now given his multiple upcoming office visits and procedures which is certainly reasonable given no recent anginal symptoms.  - Continue ASA, Plavix, Toprol-XL and Pravastatin.   2. PAD - Followed by Dr. Chestine Spore with Vascular Surgery and he is s/p left femoral endarterectomy with bovine patch and left fem-pop bypass in 12/2021. Dopplers in 06/2022 showed a widely patent bypass graft but was noted to have 30-49% stenosis along the inflow artery and 50 to 70% stenosis involving the proximal anastomosis.  He is scheduled for follow-up imaging in 12/2022. - He has been on Plavix from a Vascular perspective and I did confirm with Dr. Chestine Spore  he could hold Plavix for 5 days prior to his upcoming GI procedures.   3. Carotid Artery Stenosis - Carotid Dopplers in 09/2021 showed 1-39% stenosis bilaterally. He has not had repeat dopplers in the interim and is not scheduled for carotid dopplers later this year at the time of his other vascular studies. Will order today.   4. Hemochromatosis/Anemia - Followed by Hematology. CBC on 11/06/2022 did show his hemoglobin was at 8.1 and he did receive 1 unit pRBC's. Planning for upcoming coloscopy and EGD.   5. HTN - BP is well-controlled at 138/68 during today's visit and SBP has been in the 120's when checked at other visits this week. Continue current medical therapy with Chlorthalidone 12.5 mg daily, Losartan 25 mg daily and Toprol-XL 100 mg daily. Creatinine was stable at 1.18 by most recent labs in 08/2022.  6.  HLD - FLP in 2023 showed his LDL was down to 15. Continue current medical therapy with Pravastatin 40 mg daily and Fish Oil.   Signed, Ellsworth Lennox, PA-C

## 2022-11-08 NOTE — Patient Instructions (Signed)
[facility]  Discharge Instructions: Thank you for choosing [facility] to provide your oncology and hematology care.  If you have a lab appointment with the Cancer Center - please note that after April 8th, 2024, all labs will be drawn in the cancer center.  You do not have to check in or register with the main entrance as you have in the past but will complete your check-in in the cancer center.  Wear comfortable clothing and clothing appropriate for easy access to any Portacath or PICC line.   We strive to give you quality time with your provider. You may need to reschedule your appointment if you arrive late (15 or more minutes).  Arriving late affects you and other patients whose appointments are after yours.  Also, if you miss three or more appointments without notifying the office, you may be dismissed from the clinic at the provider's discretion.      For prescription refill requests, have your pharmacy contact our office and allow 72 hours for refills to be completed.    Today you received 1 unit of blood.     BELOW ARE SYMPTOMS THAT SHOULD BE REPORTED IMMEDIATELY: *FEVER GREATER THAN 100.4 F (38 C) OR HIGHER *CHILLS OR SWEATING *NAUSEA AND VOMITING THAT IS NOT CONTROLLED WITH YOUR NAUSEA MEDICATION *UNUSUAL SHORTNESS OF BREATH *UNUSUAL BRUISING OR BLEEDING *URINARY PROBLEMS (pain or burning when urinating, or frequent urination) *BOWEL PROBLEMS (unusual diarrhea, constipation, pain near the anus) TENDERNESS IN MOUTH AND THROAT WITH OR WITHOUT PRESENCE OF ULCERS (sore throat, sores in mouth, or a toothache) UNUSUAL RASH, SWELLING OR PAIN  UNUSUAL VAGINAL DISCHARGE OR ITCHING   Items with * indicate a potential emergency and should be followed up as soon as possible or go to the Emergency Department if any problems should occur.  Please show the CHEMOTHERAPY ALERT CARD or IMMUNOTHERAPY ALERT CARD at check-in to the Emergency Department and  triage nurse.  Should you have questions after your visit or need to cancel or reschedule your appointment, please contact [facility] [phone]  and follow the prompts.  Office hours are 8:00 a.m. to 4:30 p.m. Monday - Friday. Please note that voicemails left after 4:00 p.m. may not be returned until the following business day.  We are closed weekends and major holidays. You have access to a nurse at all times for urgent questions. Please call the main number to the clinic [phone] and follow the prompts.  For any non-urgent questions, you may also contact your provider using MyChart. We now offer e-Visits for anyone 18 and older to request care online for non-urgent symptoms. For details visit [email or web address].   Also download the MyChart app! Go to the app store, search "MyChart", open the app, select [facility], and log in with your MyChart username and password.   

## 2022-11-09 ENCOUNTER — Encounter: Payer: Self-pay | Admitting: Student

## 2022-11-09 LAB — BPAM RBC
Blood Product Expiration Date: 202407182359
ISSUE DATE / TIME: 202407030852
Unit Type and Rh: 1700

## 2022-11-09 LAB — TYPE AND SCREEN
ABO/RH(D): B POS
Antibody Screen: NEGATIVE
Unit division: 0

## 2022-11-13 NOTE — Telephone Encounter (Signed)
Patient informed. 

## 2022-11-21 ENCOUNTER — Ambulatory Visit (HOSPITAL_COMMUNITY)
Admission: RE | Admit: 2022-11-21 | Discharge: 2022-11-21 | Disposition: A | Discharge status: Home / Self Care | Payer: Medicare PPO | Source: Ambulatory Visit | Attending: Student | Admitting: Student

## 2022-11-21 DIAGNOSIS — I6523 Occlusion and stenosis of bilateral carotid arteries: Secondary | ICD-10-CM | POA: Insufficient documentation

## 2022-11-24 ENCOUNTER — Encounter: Payer: Self-pay | Admitting: Nurse Practitioner

## 2022-11-24 ENCOUNTER — Ambulatory Visit (INDEPENDENT_AMBULATORY_CARE_PROVIDER_SITE_OTHER): Payer: Medicare PPO | Admitting: Nurse Practitioner

## 2022-11-24 ENCOUNTER — Other Ambulatory Visit: Payer: Self-pay | Admitting: Cardiovascular Disease

## 2022-11-24 VITALS — BP 140/70 | HR 76 | Temp 98.0°F | Ht 67.0 in | Wt 203.0 lb

## 2022-11-24 DIAGNOSIS — L0291 Cutaneous abscess, unspecified: Secondary | ICD-10-CM | POA: Insufficient documentation

## 2022-11-24 MED ORDER — SULFAMETHOXAZOLE-TRIMETHOPRIM 800-160 MG PO TABS
1.0000 | ORAL_TABLET | Freq: Two times a day (BID) | ORAL | 0 refills | Status: DC
Start: 2022-11-24 — End: 2022-12-11

## 2022-11-24 NOTE — Patient Instructions (Signed)
Nice to see you today I have sent in antibiotics to the pharmacy  Follow up if you do not improve

## 2022-11-24 NOTE — Progress Notes (Signed)
Acute Office Visit  Subjective:     Patient ID: Aaron Sexton, male    DOB: 07/08/1945, 77 y.o.   MRN: [id number]  Chief Complaint  Patient presents with   Cyst    Cyst appeared a week ago and ruptured today. Pt states no pain.    HPI Patient is in today for cyst with a history pvd, pad, htn, cva,hld, antiplatelet use   States that he has had the cyst for years. States they have tried twice to lance it. States that last time it was suppose to fix it. State it is ozzing/draining . No fever or chills    Review of Systems  Constitutional:  Negative for chills and fever.  Respiratory:  Negative for shortness of breath.   Cardiovascular:  Negative for chest pain.  Skin:  Positive for itching.       "+"  Neurological:  Negative for headaches.  Psychiatric/Behavioral:  Negative for hallucinations and suicidal ideas.         Objective:    BP (!) 140/70   Pulse 76   Temp 98 F (36.7 C) (Temporal)   Ht 5\' 7"  (1.702 m)   Wt 203 lb (92.1 kg)   SpO2 92%   BMI 31.79 kg/m    Physical Exam Skin:    General: Skin is warm.     Findings: Lesion present.        Diagnosis: abscess - Location: Right upper medial back Procedure: Incision & drainage Informed consent:  Discussed risks (permanent loss of nail, permanent irregular growth of nail, infection, pain, bleeding, bruising, numbness, and recurrence of the condition) and benefits of the procedure, as well as the alternatives.  Informed consent was obtained. Anesthesia: Lidocaine 2% with Epi Type: topical The area was prepared and draped in a standard fashion. The lesion drained pus. The patient tolerated the procedure well. The patient was instructed on post-op care.   No results found for any visits on 11/24/22.      Assessment & Plan:   Problem List Items Addressed This Visit       Other   Abscess - Primary    I&D done after verbal consent was obtained. Guaze and bandaid dressing placed. Bleeding was  controlled and hemostasis achieved with local pressure. Bactrim DS 1 tab BID for 7 days       Relevant Medications   sulfamethoxazole-trimethoprim (BACTRIM DS) 800-160 MG tablet   Other Relevant Orders   Incision and drainage    Meds ordered this encounter  Medications   sulfamethoxazole-trimethoprim (BACTRIM DS) 800-160 MG tablet    Sig: Take 1 tablet by mouth 2 (two) times daily.    Dispense:  14 tablet    Refill:  0    Order Specific Question:   Supervising Provider    Answer:   TOWER, MARNE A [[id number]    Return if symptoms worsen or fail to improve.  Audria Nine, NP

## 2022-11-24 NOTE — Assessment & Plan Note (Signed)
I&D done after verbal consent was obtained. Guaze and bandaid dressing placed. Bleeding was controlled and hemostasis achieved with local pressure. Bactrim DS 1 tab BID for 7 days

## 2022-11-27 ENCOUNTER — Telehealth: Payer: Self-pay | Admitting: Gastroenterology

## 2022-11-27 NOTE — Telephone Encounter (Signed)
PT wife called to advise [place] that he was put on generic bactrim as of Friday

## 2022-11-30 ENCOUNTER — Encounter: Payer: Self-pay | Admitting: Gastroenterology

## 2022-11-30 ENCOUNTER — Ambulatory Visit (AMBULATORY_SURGERY_CENTER): Payer: Medicare PPO | Admitting: Gastroenterology

## 2022-11-30 VITALS — BP 119/38 | HR 60 | Temp 98.0°F | Resp 18 | Ht 67.0 in | Wt 205.0 lb

## 2022-11-30 DIAGNOSIS — D123 Benign neoplasm of transverse colon: Secondary | ICD-10-CM | POA: Diagnosis not present

## 2022-11-30 DIAGNOSIS — K319 Disease of stomach and duodenum, unspecified: Secondary | ICD-10-CM | POA: Diagnosis not present

## 2022-11-30 DIAGNOSIS — Z86010 Personal history of colonic polyps: Secondary | ICD-10-CM

## 2022-11-30 DIAGNOSIS — D649 Anemia, unspecified: Secondary | ICD-10-CM | POA: Diagnosis not present

## 2022-11-30 DIAGNOSIS — K3189 Other diseases of stomach and duodenum: Secondary | ICD-10-CM

## 2022-11-30 DIAGNOSIS — Z09 Encounter for follow-up examination after completed treatment for conditions other than malignant neoplasm: Secondary | ICD-10-CM

## 2022-11-30 DIAGNOSIS — I10 Essential (primary) hypertension: Secondary | ICD-10-CM | POA: Diagnosis not present

## 2022-11-30 DIAGNOSIS — K259 Gastric ulcer, unspecified as acute or chronic, without hemorrhage or perforation: Secondary | ICD-10-CM | POA: Diagnosis not present

## 2022-11-30 DIAGNOSIS — R195 Other fecal abnormalities: Secondary | ICD-10-CM | POA: Diagnosis not present

## 2022-11-30 DIAGNOSIS — E663 Overweight: Secondary | ICD-10-CM | POA: Diagnosis not present

## 2022-11-30 DIAGNOSIS — E785 Hyperlipidemia, unspecified: Secondary | ICD-10-CM | POA: Diagnosis not present

## 2022-11-30 MED ORDER — SODIUM CHLORIDE 0.9 % IV SOLN: 500.0000 mL | Freq: Once | INTRAVENOUS | Status: DC

## 2022-11-30 NOTE — Op Note (Addendum)
[facility] Patient Name: Aaron Sexton Procedure Date: 11/30/2022 2:35 PM MRN: [id number] Endoscopist: Sherilyn Cooter L. Myrtie Neither , MD, [id number] Age: 77 Referring MD:  Date of Birth: 08-04-1945 Gender: Male Account #: [id number] Procedure:                Upper GI endoscopy Indications:              Unexplained macrocytic anemia, Heme positive stool                           see recent GI office consult and Hematology notes                            for details                           Hematology requesting EGD/colon for evaluation of                            recent acute on chronic drop in Hgb with 2/3 stool                            cards positive for occult blood Medicines:                Monitored Anesthesia Care Procedure:                Pre-Anesthesia Assessment:                           - Prior to the procedure, a History and Physical                            was performed, and patient medications and                            allergies were reviewed. The patient's tolerance of                            previous anesthesia was also reviewed. The risks                            and benefits of the procedure and the sedation                            options and risks were discussed with the patient.                            All questions were answered, and informed consent                            was obtained. Prior Anticoagulants: The patient has                            taken Plavix (clopidogrel), last dose was 5 days  prior to procedure. ASA Grade Assessment: III - A                            patient with severe systemic disease. After                            reviewing the risks and benefits, the patient was                            deemed in satisfactory condition to undergo the                            procedure.                           After obtaining informed consent, the endoscope was                             passed under direct vision. Throughout the                            procedure, the patient's blood pressure, pulse, and                            oxygen saturations were monitored continuously. The                            GIF [id number] #[id number] was introduced through the                            mouth, and advanced to the third part of duodenum.                            The upper GI endoscopy was accomplished without                            difficulty. The patient tolerated the procedure                            well. Scope In: Scope Out: Findings:                 The esophagus was normal.                           Multiple erosions with no bleeding and no stigmata                            of recent bleeding were found in the prepyloric                            region of the stomach. Several biopsies were                            obtained in the gastric body and in  the gastric                            antrum with cold forceps for histology.                           A single 10 mm mucosal papule (nodule) with a                            central umbilication was found in the gastric                            antrum.                           The exam of the stomach was otherwise normal                            (except for scattered diminutive flecks of hematin).                           The cardia and gastric fundus were normal on                            retroflexion.                           The examined duodenum was normal. Complications:            No immediate complications. Estimated Blood Loss:     Estimated blood loss was minimal. Impression:               - Normal esophagus.                           - Gastric erosions with no bleeding and no stigmata                            of recent bleeding.                           - A single mucosal papule (nodule) found in the                            stomach. This has the typical appearance of a                             "pancreatic rest"(estopic pancreatic tissue), which                            is a benign incidental finding.                           - Normal examined duodenum.                           - Several biopsies were obtained in  the gastric                            body and in the gastric antrum.                           If H pylori not present on Bx, then the gastric                            erosions are likely related to use of low-dose                            aspirin. While these erosions in the stomach appear                            likely to account for the positive FOBT, they are                            not felt to be significant contributing to this                            patient's macrocytic anemia. Recommendation:           - Patient has a contact number available for                            emergencies. The signs and symptoms of potential                            delayed complications were discussed with the                            patient. Return to normal activities tomorrow.                            Written discharge instructions were provided to the                            patient.                           - Resume previous diet.                           - Resume Plavix (clopidogrel) at prior dose                            tomorrow.                           - Await pathology results.                           - See the other procedure note for documentation of  additional recommendations.                           - Over-the-counter omeprazole 20 mg, 1 tablet every                            other day to decrease the chance of erosions                            progressing to peptic ulcer disease. Jasmarie Coppock L. Myrtie Neither, MD 11/30/2022 3:28:50 PM This report has been signed electronically.

## 2022-11-30 NOTE — Progress Notes (Signed)
History and Physical:  This patient presents for endoscopic testing for: Encounter Diagnoses  Name Primary?   Heme positive stool Yes   Anemia, unspecified type    History of colonic polyps     Chronic macrocytic anemia, followed by hematology.  Recent office visit at [facility] (10/27/22) for acute on chronic anemia and 2/3 stool cards positive for occult blood.  Hematology requesting evaluation for sources of blood loss anemia before considering a possible bone marrow Bx. Plavix has been held for last 5 days.  Patient is otherwise without complaints or active issues today.   Past Medical History: 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   Cerebrovascular disease    Moderate ASVD without focal stenosis in 01/2007   Clotting disorder (HCC)    Colon polyps 07/2010   single 2mm polyp--tubular adenoma   Diverticulosis 2012   found on colonoscopy   Erectile dysfunction    Family history of adverse reaction to anesthesia    difficult for son & sistor to wake    GERD (gastroesophageal reflux disease)    Hematochezia 05/24/2010   Hyperlipidemia    Hypertension    Osteoarthritis    knees/hands-Dr Lucey   Overweight(278.02)    Peripheral vascular disease (HCC) 03/11/2010   Moderate SFA stenosis; history of claudication   Pneumonia    Rosacea    Vitamin B12 deficiency      Past Surgical History: Past Surgical History:  Procedure Laterality Date   APPENDECTOMY     APPLICATION OF WOUND VAC Left 12/17/2021   Procedure: APPLICATION OF PREVENA WOUND VAC LEFT GROIN;  Surgeon: Victorino Sparrow, MD;  Location: [facility] OR;  Service: Vascular;  Laterality: Left;   APPLICATION OF WOUND VAC Left 01/08/2022   Procedure: APPLICATION OF WOUND VAC left groin.;  Surgeon: Cephus Shelling, MD;  Location: [facility] OR;  Service: Vascular;  Laterality: Left;   COLONOSCOPY W/ POLYPECTOMY  2012    ENDARTERECTOMY FEMORAL Left 12/09/2021   Procedure: LEFT COMMON FEMORAL ENDARTERECTOMY WITH 1 CM X 6 CM XENOSURE BOVINE PATCH ANGIOPLASTY;  Surgeon: Cephus Shelling, MD;  Location: [facility] OR;  Service: Vascular;  Laterality: Left;   FEMORAL-POPLITEAL BYPASS GRAFT Left 12/09/2021   Procedure: LEFT FEMORAL- ABOVE KNEE POPLITEAL BYPASS WITH 6 mm x 80 cm PROPATEN GRAFT;  Surgeon: Cephus Shelling, MD;  Location: [facility] OR;  Service: Vascular;  Laterality: Left;  INSERT ARTERIAL LINE   ILIAC VEIN ANGIOPLASTY / STENTING Right 08/09/2015   INCISION AND DRAINAGE OF WOUND Left 01/08/2022   Procedure: IRRIGATION AND DEBRIDEMENT GROIN LEFT application of Myriad Morcells.;  Surgeon: Cephus Shelling, MD;  Location: [facility] OR;  Service: Vascular;  Laterality: Left;   INSERTION OF ILIAC STENT  12/09/2021   Procedure: INSERTION OF LEFT COMMON AND EXTERNAL  ILIAC STENTS WITH ANGIOPLASTY;  Surgeon: Cephus Shelling, MD;  Location: [facility] OR;  Service: Vascular;;   LOWER EXTREMITY ANGIOGRAM Left 12/09/2021   Procedure: LEFT ILIAC AND LOWER EXTREMITY ANGIOGRAM;  Surgeon: Cephus Shelling, MD;  Location: [facility] OR;  Service: Vascular;  Laterality: Left;   LOWER EXTREMITY ANGIOGRAPHY Bilateral 11/03/2021   Procedure: Lower Extremity Angiography;  Surgeon: Runell Gess, MD;  Location: [facility] INVASIVE CV LAB;  Service: Cardiovascular;  Laterality: Bilateral;  Limited Study   PERIPHERAL VASCULAR CATHETERIZATION Bilateral 06/21/2015   Procedure: Lower Extremity Angiography;  Surgeon: Runell Gess, MD;  Location: [facility] INVASIVE CV LAB;  Service: Cardiovascular;  Laterality: Bilateral;   PERIPHERAL VASCULAR CATHETERIZATION N/A 06/21/2015   Procedure: Abdominal Aortogram;  Surgeon: Runell Gess, MD;  Location: [facility] INVASIVE CV LAB;  Service: Cardiovascular;  Laterality: N/A;   PERIPHERAL VASCULAR CATHETERIZATION N/A 07/26/2015   Procedure: Lower Extremity Angiography;  Surgeon: Runell Gess, MD;  Location: [facility] INVASIVE CV LAB;   Service: Cardiovascular;  Laterality: N/A;   PERIPHERAL VASCULAR CATHETERIZATION N/A 08/09/2015   Procedure: Lower Extremity Angiography;  Surgeon: Runell Gess, MD;  Location: [facility] INVASIVE CV LAB;  Service: Cardiovascular;  Laterality: N/A;   PERIPHERAL VASCULAR CATHETERIZATION  08/09/2015   Procedure: Peripheral Vascular Intervention;  Surgeon: Runell Gess, MD;  Location: [facility] INVASIVE CV LAB;  Service: Cardiovascular;;  rt ext. iliac atherectomy and stent   PERIPHERAL VASCULAR CATHETERIZATION N/A 09/20/2015   Procedure: Lower Extremity Angiography;  Surgeon: Runell Gess, MD;  Location: [facility] INVASIVE CV LAB;  Service: Cardiovascular;  Laterality: N/A;   PERIPHERAL VASCULAR CATHETERIZATION Right 09/20/2015   Procedure: Peripheral Vascular Intervention;  Surgeon: Runell Gess, MD;  Location: [facility] INVASIVE CV LAB;  Service: Cardiovascular;  Laterality: Right;  SFA   ROTATOR CUFF REPAIR Left 7/15   Dr Sherlean Foot   WOUND EXPLORATION Left 12/17/2021   Procedure: LEFT LEG WOUND EXPLORATION AND WASH OUT WITH MYRIAD MORCELLS;  Surgeon: Victorino Sparrow, MD;  Location: [facility] OR;  Service: Vascular;  Laterality: Left;    Allergies: Allergies  Allergen Reactions   Cardizem [Diltiazem Hcl] Hives and Rash    Outpatient Meds: Current Outpatient Medications  Medication Sig Dispense Refill   aspirin 81 MG tablet Take 81 mg by mouth every evening.     chlorthalidone (HYGROTON) 25 MG tablet TAKE 1/2 TABLET(12.5 MG) BY MOUTH DAILY 30 tablet 3   losartan (COZAAR) 25 MG tablet Take 1 tablet (25 mg total) by mouth daily. 90 tablet 3   metoprolol succinate (TOPROL-XL) 100 MG 24 hr tablet TAKE 1 TABLET(100 MG) BY MOUTH DAILY WITH OR IMMEDIATELY FOLLOWING A MEAL 30 tablet 1   Omega-3 Fatty Acids (FISH OIL) 1000 MG CAPS Take 1,000 mg by mouth daily.     pravastatin (PRAVACHOL) 40 MG tablet TAKE 1 TABLET(40 MG) BY MOUTH DAILY 90 tablet 3   sulfamethoxazole-trimethoprim (BACTRIM DS) 800-160 MG tablet Take 1 tablet by  mouth 2 (two) times daily. 14 tablet 0   traMADol (ULTRAM) 50 MG tablet TAKE 1 TABLET BY MOUTH EVERY 8 HOURS AS NEEDED 90 tablet 0   clopidogrel (PLAVIX) 75 MG tablet TAKE 1 TABLET(75 MG) BY MOUTH DAILY WITH BREAKFAST 90 tablet 3   Cyanocobalamin (VITAMIN B-12) 500 MCG SUBL Place 500 mcg under the tongue daily. (Patient not taking: Reported on 11/30/2022)     loperamide (IMODIUM A-D) 2 MG tablet Take 2 mg by mouth 4 (four) times daily as needed for diarrhea or loose stools.     nitroGLYCERIN (NITROSTAT) 0.4 MG SL tablet Place 1 tablet (0.4 mg total) under the tongue every 5 (five) minutes as needed for chest pain. 25 tablet 1   Current Facility-Administered Medications  Medication Dose Route Frequency Provider Last Rate Last Admin   0.9 %  sodium chloride infusion  500 mL Intravenous Once Charlie Pitter III, MD          ___________________________________________________________________ Objective   Exam:  BP (!) 134/55   Pulse 67   Temp 98 F (36.7 C)   Ht 5\' 7"  (1.702 m)   Wt  205 lb (93 kg)   SpO2 96%   BMI 32.11 kg/m   CV: regular , S1/S2 Resp: clear to auscultation bilaterally, normal RR and effort noted GI: soft, no tenderness, with active bowel sounds.   Assessment: Encounter Diagnoses  Name Primary?   Heme positive stool Yes   Anemia, unspecified type    History of colonic polyps      Plan: Colonoscopy EGD  The benefits and risks of the planned procedure were described in detail with the patient or (when appropriate) their health care proxy.  Risks were outlined as including, but not limited to, bleeding, infection, perforation, adverse medication reaction leading to cardiac or pulmonary decompensation, pancreatitis (if ERCP).  The limitation of incomplete mucosal visualization was also discussed.  No guarantees or warranties were given.  The patient is appropriate for an endoscopic procedure in the ambulatory setting.   - Amada Jupiter, MD

## 2022-11-30 NOTE — Progress Notes (Signed)
Sedate, gd SR, tolerated procedure well, VSS, report to RN 

## 2022-11-30 NOTE — Op Note (Signed)
[facility] Patient Name: Aaron Sexton Procedure Date: 11/30/2022 2:34 PM MRN: [id number] Endoscopist: Sherilyn Cooter L. Myrtie Neither , MD, [id number] Age: 77 Referring MD:  Date of Birth: Sep 08, 1945 Gender: Male Account #: [id number] Procedure:                Colonoscopy Indications:              Heme positive stool, Unexplained macrocytic anemia                           See recent office consult note and today's EGD                            report for further clinical details. Medicines:                Monitored Anesthesia Care Procedure:                Pre-Anesthesia Assessment:                           - Prior to the procedure, a History and Physical                            was performed, and patient medications and                            allergies were reviewed. The patient's tolerance of                            previous anesthesia was also reviewed. The risks                            and benefits of the procedure and the sedation                            options and risks were discussed with the patient.                            All questions were answered, and informed consent                            was obtained. Prior Anticoagulants: The patient has                            taken Plavix (clopidogrel), last dose was 5 days                            prior to procedure. ASA Grade Assessment: III - A                            patient with severe systemic disease. After                            reviewing the risks and benefits, the patient was  deemed in satisfactory condition to undergo the                            procedure.                           After obtaining informed consent, the colonoscope                            was passed under direct vision. Throughout the                            procedure, the patient's blood pressure, pulse, and                            oxygen saturations were monitored continuously. The                             Olympus CF-HQ190L ([id number]) Colonoscope was                            introduced through the anus and advanced to the the                            cecum, identified by appendiceal orifice and                            ileocecal valve. The colonoscopy was performed with                            difficulty due to a redundant colon and significant                            looping. Successful completion of the procedure was                            aided by changing the patient to a prone position,                            using manual pressure and straightening and                            shortening the scope to obtain bowel loop                            reduction. The patient tolerated the procedure                            well. The quality of the bowel preparation was                            good. The ileocecal valve, appendiceal orifice, and  rectum were photographed. Scope In: 2:55:27 PM Scope Out: 3:14:02 PM Scope Withdrawal Time: 0 hours 8 minutes 21 seconds  Total Procedure Duration: 0 hours 18 minutes 35 seconds  Findings:                 The perianal and digital rectal examinations were                            normal.                           Repeat examination of right colon under NBI                            performed.                           An 8 mm polyp was found in the transverse colon.                            The polyp was sessile. The polyp was removed with a                            cold snare. Resection and retrieval were complete.                           Multiple diverticula were found in the left colon.                           The colon (entire examined portion) was redundant.                           Internal hemorrhoids were found.                           The exam was otherwise without abnormality on                            direct and retroflexion views. Complications:             No immediate complications. Estimated Blood Loss:     Estimated blood loss was minimal. Impression:               - One 8 mm polyp in the transverse colon, removed                            with a cold snare. Resected and retrieved.                           - Diverticulosis in the left colon.                           - Redundant colon.                           - Internal hemorrhoids.                           -  The examination was otherwise normal on direct                            and retroflexion views. Recommendation:           - Patient has a contact number available for                            emergencies. The signs and symptoms of potential                            delayed complications were discussed with the                            patient. Return to normal activities tomorrow.                            Written discharge instructions were provided to the                            patient.                           - Resume previous diet.                           - Resume Plavix (clopidogrel) at prior dose                            tomorrow.                           - Await pathology results.                           - No repeat colonoscopy due to age, current                            guidelines and low risk findings today.                           - Return to hematology for further evaluation of                            anemia. (Endoscopic reports will be forwarded to                            that physician) Sherilyn Cooter L. Myrtie Neither, MD 11/30/2022 3:32:29 PM This report has been signed electronically.

## 2022-11-30 NOTE — Progress Notes (Signed)
Called to room to assist during endoscopic procedure.  Patient ID and intended procedure confirmed with present staff. Received instructions for my participation in the procedure from the performing physician.  

## 2022-11-30 NOTE — Patient Instructions (Addendum)
Handouts provided for gastritis, polyps, diverticulosis and hemorrhoids.  Resume previous diet.  Resume Plavix (clopidogrel) at prior dose TOMORROW.   Over the counter omeprazole 20 mg, 1 tablet every other day to decrease the chance of erosions progressing to peptic ulcer disease.  Await pathology results.  No repeat colonoscopy due to age, current guidelines and low risk findings.  Return to hematology for further evaluation of anemia.  (Endoscopic reports will be forwarded to that physician)  YOU HAD AN ENDOSCOPIC PROCEDURE TODAY AT THE [facility]:   Refer to the procedure report that was given to you for any specific questions about what was found during the examination.  If the procedure report does not answer your questions, please call your gastroenterologist to clarify.  If you requested that your care partner not be given the details of your procedure findings, then the procedure report has been included in a sealed envelope for you to review at your convenience later.  YOU SHOULD EXPECT: Some feelings of bloating in the abdomen. Passage of more gas than usual.  Walking can help get rid of the air that was put into your GI tract during the procedure and reduce the bloating. If you had a lower endoscopy (such as a colonoscopy or flexible sigmoidoscopy) you may notice spotting of blood in your stool or on the toilet paper. If you underwent a bowel prep for your procedure, you may not have a normal bowel movement for a few days.  Please Note:  You might notice some irritation and congestion in your nose or some drainage.  This is from the oxygen used during your procedure.  There is no need for concern and it should clear up in a day or so.  SYMPTOMS TO REPORT IMMEDIATELY:  Following lower endoscopy (colonoscopy or flexible sigmoidoscopy):  Excessive amounts of blood in the stool  Significant tenderness or worsening of abdominal pains  Swelling of the abdomen that is new,  acute  Fever of 100F or higher  Following upper endoscopy (EGD)  Vomiting of blood or coffee ground material  New chest pain or pain under the shoulder blades  Painful or persistently difficult swallowing  New shortness of breath  Fever of 100F or higher  Black, tarry-looking stools  For urgent or emergent issues, a gastroenterologist can be reached at any hour by calling [phone]. Do not use MyChart messaging for urgent concerns.    DIET:  We do recommend a small meal at first, but then you may proceed to your regular diet.  Drink plenty of fluids but you should avoid alcoholic beverages for 24 hours.  ACTIVITY:  You should plan to take it easy for the rest of today and you should NOT DRIVE or use heavy machinery until tomorrow (because of the sedation medicines used during the test).    FOLLOW UP: Our staff will call the number listed on your records the next business day following your procedure.  We will call around 7:15- 8:00 am to check on you and address any questions or concerns that you may have regarding the information given to you following your procedure. If we do not reach you, we will leave a message.     If any biopsies were taken you will be contacted by phone or by letter within the next 1-3 weeks.  Please call us at [phone] if you have not heard about the biopsies in 3 weeks.    SIGNATURES/CONFIDENTIALITY: You and/or your care partner have signed  paperwork which will be entered into your electronic medical record.  These signatures attest to the fact that that the information above on your After Visit Summary has been reviewed and is understood.  Full responsibility of the confidentiality of this discharge information lies with you and/or your care-partner.

## 2022-12-01 ENCOUNTER — Telehealth: Payer: Self-pay

## 2022-12-01 NOTE — Telephone Encounter (Signed)
  Follow up Call-     11/30/2022    1:51 PM  Call back number  Post procedure Call Back phone  # [phone]  Permission to leave phone message Yes     Patient questions:  Do you have a fever, pain , or abdominal swelling? No. Pain Score  0 *  Have you tolerated food without any problems? Yes.    Have you been able to return to your normal activities? Yes.    Do you have any questions about your discharge instructions: Diet   No. Medications  No. Follow up visit  No.  Do you have questions or concerns about your Care? No.  Actions: * If pain score is 4 or above: No action needed, pain <4.

## 2022-12-08 ENCOUNTER — Other Ambulatory Visit: Payer: Self-pay

## 2022-12-08 DIAGNOSIS — D649 Anemia, unspecified: Secondary | ICD-10-CM

## 2022-12-11 ENCOUNTER — Inpatient Hospital Stay: Payer: Medicare PPO | Attending: Hematology | Admitting: Hematology

## 2022-12-11 ENCOUNTER — Other Ambulatory Visit: Payer: Medicare PPO

## 2022-12-11 ENCOUNTER — Inpatient Hospital Stay: Payer: Medicare PPO

## 2022-12-11 VITALS — BP 123/59 | HR 73 | Temp 98.2°F | Resp 16 | Wt 205.1 lb

## 2022-12-11 DIAGNOSIS — Z7902 Long term (current) use of antithrombotics/antiplatelets: Secondary | ICD-10-CM | POA: Insufficient documentation

## 2022-12-11 DIAGNOSIS — Z801 Family history of malignant neoplasm of trachea, bronchus and lung: Secondary | ICD-10-CM | POA: Insufficient documentation

## 2022-12-11 DIAGNOSIS — E785 Hyperlipidemia, unspecified: Secondary | ICD-10-CM | POA: Diagnosis not present

## 2022-12-11 DIAGNOSIS — D539 Nutritional anemia, unspecified: Secondary | ICD-10-CM | POA: Diagnosis not present

## 2022-12-11 DIAGNOSIS — K5730 Diverticulosis of large intestine without perforation or abscess without bleeding: Secondary | ICD-10-CM | POA: Diagnosis not present

## 2022-12-11 DIAGNOSIS — I10 Essential (primary) hypertension: Secondary | ICD-10-CM | POA: Diagnosis not present

## 2022-12-11 DIAGNOSIS — I6522 Occlusion and stenosis of left carotid artery: Secondary | ICD-10-CM | POA: Insufficient documentation

## 2022-12-11 DIAGNOSIS — I493 Ventricular premature depolarization: Secondary | ICD-10-CM | POA: Insufficient documentation

## 2022-12-11 DIAGNOSIS — Z7961 Long term (current) use of immunomodulator: Secondary | ICD-10-CM | POA: Diagnosis not present

## 2022-12-11 DIAGNOSIS — I739 Peripheral vascular disease, unspecified: Secondary | ICD-10-CM | POA: Insufficient documentation

## 2022-12-11 DIAGNOSIS — Z86010 Personal history of colonic polyps: Secondary | ICD-10-CM | POA: Insufficient documentation

## 2022-12-11 DIAGNOSIS — M1990 Unspecified osteoarthritis, unspecified site: Secondary | ICD-10-CM | POA: Diagnosis not present

## 2022-12-11 DIAGNOSIS — D123 Benign neoplasm of transverse colon: Secondary | ICD-10-CM | POA: Insufficient documentation

## 2022-12-11 DIAGNOSIS — M2550 Pain in unspecified joint: Secondary | ICD-10-CM | POA: Diagnosis not present

## 2022-12-11 DIAGNOSIS — R7989 Other specified abnormal findings of blood chemistry: Secondary | ICD-10-CM | POA: Diagnosis not present

## 2022-12-11 DIAGNOSIS — Z87891 Personal history of nicotine dependence: Secondary | ICD-10-CM | POA: Insufficient documentation

## 2022-12-11 DIAGNOSIS — Z79899 Other long term (current) drug therapy: Secondary | ICD-10-CM | POA: Insufficient documentation

## 2022-12-11 DIAGNOSIS — D649 Anemia, unspecified: Secondary | ICD-10-CM | POA: Diagnosis not present

## 2022-12-11 DIAGNOSIS — Z7982 Long term (current) use of aspirin: Secondary | ICD-10-CM | POA: Insufficient documentation

## 2022-12-11 DIAGNOSIS — E538 Deficiency of other specified B group vitamins: Secondary | ICD-10-CM | POA: Insufficient documentation

## 2022-12-11 DIAGNOSIS — I2510 Atherosclerotic heart disease of native coronary artery without angina pectoris: Secondary | ICD-10-CM | POA: Diagnosis not present

## 2022-12-11 DIAGNOSIS — K219 Gastro-esophageal reflux disease without esophagitis: Secondary | ICD-10-CM | POA: Insufficient documentation

## 2022-12-11 LAB — FERRITIN: Ferritin: 121 ng/mL (ref 24–336)

## 2022-12-11 LAB — CBC WITH DIFFERENTIAL/PLATELET
Abs Immature Granulocytes: 0.04 10*3/uL (ref 0.00–0.07)
Basophils Absolute: 0 10*3/uL (ref 0.0–0.1)
Basophils Relative: 1 %
Eosinophils Absolute: 0.2 10*3/uL (ref 0.0–0.5)
Eosinophils Relative: 3 %
HCT: 23.3 % — ABNORMAL LOW (ref 39.0–52.0)
Hemoglobin: 7.6 g/dL — ABNORMAL LOW (ref 13.0–17.0)
Immature Granulocytes: 1 %
Lymphocytes Relative: 30 %
Lymphs Abs: 1.9 10*3/uL (ref 0.7–4.0)
MCH: 34.9 pg — ABNORMAL HIGH (ref 26.0–34.0)
MCHC: 32.6 g/dL (ref 30.0–36.0)
MCV: 106.9 fL — ABNORMAL HIGH (ref 80.0–100.0)
Monocytes Absolute: 0.9 10*3/uL (ref 0.1–1.0)
Monocytes Relative: 14 %
Neutro Abs: 3.3 10*3/uL (ref 1.7–7.7)
Neutrophils Relative %: 51 %
Platelets: 318 10*3/uL (ref 150–400)
RBC: 2.18 MIL/uL — ABNORMAL LOW (ref 4.22–5.81)
RDW: 23.2 % — ABNORMAL HIGH (ref 11.5–15.5)
WBC: 6.3 10*3/uL (ref 4.0–10.5)
nRBC: 1.1 % — ABNORMAL HIGH (ref 0.0–0.2)

## 2022-12-11 LAB — IRON AND TIBC
Iron: 150 ug/dL (ref 45–182)
Saturation Ratios: 65 % — ABNORMAL HIGH (ref 17.9–39.5)
TIBC: 231 ug/dL — ABNORMAL LOW (ref 250–450)
UIBC: 81 ug/dL

## 2022-12-11 LAB — RETICULOCYTES
Immature Retic Fract: 29.6 % — ABNORMAL HIGH (ref 2.3–15.9)
RBC.: 2.19 MIL/uL — ABNORMAL LOW (ref 4.22–5.81)
Retic Count, Absolute: 35 10*3/uL (ref 19.0–186.0)
Retic Ct Pct: 1.6 % (ref 0.4–3.1)

## 2022-12-11 LAB — SAMPLE TO BLOOD BANK

## 2022-12-11 LAB — LACTATE DEHYDROGENASE: LDH: 131 U/L (ref 98–192)

## 2022-12-11 NOTE — Patient Instructions (Addendum)
[facility]  Discharge Instructions  You were seen and examined today by Dr. Ellin Saba.  Dr. Ellin Saba discussed your most recent lab work revealed that you continue to be severely anemic. Dr. Ellin Saba will arrange for you to have blood.  There was no evidence on your EGD/colonoscopy to explain your anemia, Dr. Ellin Saba has recommended a bone marrow biopsy.  Follow-up as scheduled.  Thank you for choosing [facility] to provide your oncology and hematology care.   To afford each patient quality time with our provider, please arrive at least 15 minutes before your scheduled appointment time. You may need to reschedule your appointment if you arrive late (10 or more minutes). Arriving late affects you and other patients whose appointments are after yours.  Also, if you miss three or more appointments without notifying the office, you may be dismissed from the clinic at the provider's discretion.    Again, thank you for choosing [facility].  Our hope is that these requests will decrease the amount of time that you wait before being seen by our physicians.   If you have a lab appointment with the Cancer Center - please note that after April 8th, all labs will be drawn in the cancer center.  You do not have to check in or register with the main entrance as you have in the past but will complete your check-in at the cancer center.            _____________________________________________________________  Should you have questions after your visit to [facility], please contact our office at [phone] and follow the prompts.  Our office hours are 8:00 a.m. to 4:30 p.m. Monday - Thursday and 8:00 a.m. to 2:30 p.m. Friday.  Please note that voicemails left after 4:00 p.m. may not be returned until the following business day.  We are closed weekends and all major holidays.  You do have access to a nurse 24-7, just  call the main number to the clinic [phone] and do not press any options, hold on the line and a nurse will answer the phone.    For prescription refill requests, have your pharmacy contact our office and allow 72 hours.    Masks are no longer required in the cancer centers. If you would like for your care team to wear a mask while they are taking care of you, please let them know. You may have one support person who is at least 79 years old accompany you for your appointments.

## 2022-12-11 NOTE — Progress Notes (Signed)
[facility] [address] [place] [address]    Clinic Day:  12/11/2022  Referring physician: Karie Schwalbe, MD  Patient Care Team: Karie Schwalbe, MD as PCP - General (Internal Medicine) Wyline Mood Dorothe Pea, MD as PCP - Cardiology (Cardiology) Doreatha Massed, MD as Medical Oncologist (Hematology)   ASSESSMENT & PLAN:   Assessment: 1.  Hereditary hemochromatosis: -Patient seen for elevated ferritin levels. -EMR evaluation shows hemochromatosis testing on 01/27/2019 which showed heterozygosity for C282Y and H63D variants. -Given the elevated ferritin levels above 1000 and the results of genetic testing, this is compatible with hereditary hemochromatosis. -Patient does have arthritis of the small joints of the hands and arthritis of the back. -No history of CHF but has CAD.  No diabetes. -MRI of the liver on 12/11/2019 shows estimated liver iron concentration approximately 7.7 mg/g compatible with moderate liver iron.  Estimated fat fraction ranges from 7-12%, more pronounced in the right lobe compared to left hepatic lobe.  No focal hepatic lesions seen.  No morphological changes of significant liver disease. -Jadenu 540 mg daily started on 12/24/2019, dose increased to 720 mg daily on 02/09/2020.  - MRI liver (09/21/2021): Diminished liver iron concentration less than 2 mg/g, slightly below "mild" designation (previously well into the "moderate" range). - Dose decreased to 2 tablets daily on 07/04/2022   2.  Macrocytic anemia: -Colonoscopy by Dr. Myrtie Neither on 02/17/2019 shows diverticulosis in the left colon, 12 mm polyp in the cecum, 4 to 6 mm polyp in the ascending colon and 5 mm polyp in the descending colon.  Pathology was consistent with tubular adenomas.    Plan: 1.  Hereditary hemochromatosis: - Jadenu on hold since 09/13/2022. - Ferritin today is 121 and percent saturation 65.  Continue to hold Jadenu because of severe anemia.   2.  Macrocytic  anemia: - Previous workup for nutritional deficiencies was negative.  Stool cards were positive. - Colonoscopy (11/30/2022): 8 mm polyp in the transverse colon.  Multiple diverticula found in the left colon.  Internal hemorrhoids were found.  Exam was otherwise normal. - EGD (11/30/2022): Normal esophagus.  Multiple erosions with no bleeding and no stigmata of recent bleeding in the prepyloric region of the stomach. - CBC from today shows hemoglobin 7.6 with MCV 106.9.  White count and platelet count normal.  No clear etiology for macrocytic anemia. - Recommend bone marrow aspiration biopsy to evaluate for MDS. - Will also check serum EPO level and peripheral blood NGS myeloid panel. - He will receive 1 unit of PRBC on Wednesday. - RTC 2 weeks after biopsy.   3.  Vitamin D deficiency: - Last vitamin D was 108.  We held his weekly vitamin D dose.  Will consider repeating vitamin D level in the next few weeks.    No orders of the defined types were placed in this encounter.      [name],acting as a Neurosurgeon for Doreatha Massed, MD.,have documented all relevant documentation on the behalf of Doreatha Massed, MD,as directed by  Doreatha Massed, MD while in the presence of Doreatha Massed, MD.  I, Doreatha Massed MD, have reviewed the above documentation for accuracy and completeness, and I agree with the above.    Doreatha Massed, MD   8/5/20244:42 PM  CHIEF COMPLAINT:   Diagnosis: hereditary hemochromatosis    Cancer Staging  No matching staging information was found for the patient.    Prior Therapy: none  Current Therapy:  Deferasirox 560 mg  daily    HISTORY OF PRESENT ILLNESS:   Oncology History   No history exists.     INTERVAL HISTORY:   Aaron Sexton is a 77 y.o. male presenting to clinic today for follow up of hereditary hemochromatosis. He was last seen by me on 11/06/22.  Since his last vsit, he was found to have heme positive stool and  underwent an endoscopy on 11/30/22. Surgical pathology of the gastric anum and gastric body revealed: gastric antral and oxyntic mucosa with mild reactive changes; negative for H. Pylori on H&E stain; and negative for intestinal metaplasia or malignancy. The transverse colon polyp revealed: tubular adenoma and no high grade dysplasia or malignancy.    Today, he states that he is doing well overall. His appetite level is at 100%. His energy level is at 80%. He is accompanied by his wife.  He denies any hematochezia or hematuria. He has been put on acid reflux pills every other day since his endoscopy. Patient would prefer to have a bone marrow biopsy scheduled with me, which is on 12/18/22.   PAST MEDICAL HISTORY:   Past Medical History: 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   Cerebrovascular disease    Moderate ASVD without focal stenosis in 01/2007   Clotting disorder (HCC)    Colon polyps 07/2010   single 2mm polyp--tubular adenoma   Diverticulosis 2012   found on colonoscopy   Erectile dysfunction    Family history of adverse reaction to anesthesia    difficult for son & sistor to wake    GERD (gastroesophageal reflux disease)    Hematochezia 05/24/2010   Hyperlipidemia    Hypertension    Osteoarthritis    knees/hands-Dr Lucey   Overweight(278.02)    Peripheral vascular disease (HCC) 03/11/2010   Moderate SFA stenosis; history of claudication   Pneumonia    Rosacea    Vitamin B12 deficiency     Surgical History: Past Surgical History:  Procedure Laterality Date   APPENDECTOMY     APPLICATION OF WOUND VAC Left 12/17/2021   Procedure: APPLICATION OF PREVENA WOUND VAC LEFT GROIN;  Surgeon: Victorino Sparrow, MD;  Location: [facility] OR;  Service: Vascular;  Laterality: Left;   APPLICATION OF WOUND VAC Left 01/08/2022   Procedure: APPLICATION OF WOUND VAC left groin.;   Surgeon: Cephus Shelling, MD;  Location: [facility] OR;  Service: Vascular;  Laterality: Left;   COLONOSCOPY W/ POLYPECTOMY  2012   ENDARTERECTOMY FEMORAL Left 12/09/2021   Procedure: LEFT COMMON FEMORAL ENDARTERECTOMY WITH 1 CM X 6 CM XENOSURE BOVINE PATCH ANGIOPLASTY;  Surgeon: Cephus Shelling, MD;  Location: [facility] OR;  Service: Vascular;  Laterality: Left;   FEMORAL-POPLITEAL BYPASS GRAFT Left 12/09/2021   Procedure: LEFT FEMORAL- ABOVE KNEE POPLITEAL BYPASS WITH 6 mm x 80 cm PROPATEN GRAFT;  Surgeon: Cephus Shelling, MD;  Location: [facility] OR;  Service: Vascular;  Laterality: Left;  INSERT ARTERIAL LINE   ILIAC VEIN ANGIOPLASTY / STENTING Right 08/09/2015   INCISION AND DRAINAGE OF WOUND Left 01/08/2022   Procedure: IRRIGATION AND DEBRIDEMENT GROIN LEFT application of Myriad Morcells.;  Surgeon: Cephus Shelling, MD;  Location: [facility] OR;  Service: Vascular;  Laterality: Left;   INSERTION OF ILIAC STENT  12/09/2021   Procedure: INSERTION OF LEFT COMMON AND EXTERNAL  ILIAC STENTS WITH ANGIOPLASTY;  Surgeon: Cephus Shelling, MD;  Location: [facility] OR;  Service:  Vascular;;   LOWER EXTREMITY ANGIOGRAM Left 12/09/2021   Procedure: LEFT ILIAC AND LOWER EXTREMITY ANGIOGRAM;  Surgeon: Cephus Shelling, MD;  Location: [facility] OR;  Service: Vascular;  Laterality: Left;   LOWER EXTREMITY ANGIOGRAPHY Bilateral 11/03/2021   Procedure: Lower Extremity Angiography;  Surgeon: Runell Gess, MD;  Location: [facility] INVASIVE CV LAB;  Service: Cardiovascular;  Laterality: Bilateral;  Limited Study   PERIPHERAL VASCULAR CATHETERIZATION Bilateral 06/21/2015   Procedure: Lower Extremity Angiography;  Surgeon: Runell Gess, MD;  Location: [facility] INVASIVE CV LAB;  Service: Cardiovascular;  Laterality: Bilateral;   PERIPHERAL VASCULAR CATHETERIZATION N/A 06/21/2015   Procedure: Abdominal Aortogram;  Surgeon: Runell Gess, MD;  Location: [facility] INVASIVE CV LAB;  Service: Cardiovascular;  Laterality: N/A;   PERIPHERAL VASCULAR  CATHETERIZATION N/A 07/26/2015   Procedure: Lower Extremity Angiography;  Surgeon: Runell Gess, MD;  Location: [facility] INVASIVE CV LAB;  Service: Cardiovascular;  Laterality: N/A;   PERIPHERAL VASCULAR CATHETERIZATION N/A 08/09/2015   Procedure: Lower Extremity Angiography;  Surgeon: Runell Gess, MD;  Location: [facility] INVASIVE CV LAB;  Service: Cardiovascular;  Laterality: N/A;   PERIPHERAL VASCULAR CATHETERIZATION  08/09/2015   Procedure: Peripheral Vascular Intervention;  Surgeon: Runell Gess, MD;  Location: [facility] INVASIVE CV LAB;  Service: Cardiovascular;;  rt ext. iliac atherectomy and stent   PERIPHERAL VASCULAR CATHETERIZATION N/A 09/20/2015   Procedure: Lower Extremity Angiography;  Surgeon: Runell Gess, MD;  Location: [facility] INVASIVE CV LAB;  Service: Cardiovascular;  Laterality: N/A;   PERIPHERAL VASCULAR CATHETERIZATION Right 09/20/2015   Procedure: Peripheral Vascular Intervention;  Surgeon: Runell Gess, MD;  Location: [facility] INVASIVE CV LAB;  Service: Cardiovascular;  Laterality: Right;  SFA   ROTATOR CUFF REPAIR Left 7/15   Dr Sherlean Foot   WOUND EXPLORATION Left 12/17/2021   Procedure: LEFT LEG WOUND EXPLORATION AND WASH OUT WITH MYRIAD MORCELLS;  Surgeon: Victorino Sparrow, MD;  Location: [facility] OR;  Service: Vascular;  Laterality: Left;    Social History: Social History   Socioeconomic History   Marital status: Married    Spouse name: Not on file   Number of children: 3   Years of education: Not on file   Highest education level: Not on file  Occupational History   Occupation: Managed supply chain--- retired 2013    Comment: [employer]  Tobacco Use   Smoking status: Former    Current packs/day: 0.00    Average packs/day: 1 pack/day for 30.0 years (30.0 ttl pk-yrs)    Types: Cigarettes    Start date: 05/16/1960    Quit date: 05/16/1990    Years since quitting: 32.5    Passive exposure: Never   Smokeless tobacco: Never   Tobacco comments:    Quit in 1993  Vaping  Use   Vaping status: Never Used  Substance and Sexual Activity   Alcohol use: No    Alcohol/week: 0.0 standard drinks of alcohol   Drug use: No   Sexual activity: Not on file  Other Topics Concern   Not on file  Social History Narrative   Has living will   Wife is health care POA---alternate is son or daughter   Would accept resuscitation but no prolonged artificial ventilation.   No tube feeds if cognitively unaware   Social Determinants of Health   Financial Resource Strain: Low Risk  (03/10/2020)   Overall Financial Resource Strain (CARDIA)    Difficulty of Paying Living Expenses: Not hard at all  Food Insecurity: No Food Insecurity (  01/13/2022)   Hunger Vital Sign    Worried About Running Out of Food in the Last Year: Never true    Ran Out of Food in the Last Year: Never true  Transportation Needs: No Transportation Needs (01/13/2022)   PRAPARE - Administrator, Civil Service (Medical): No    Lack of Transportation (Non-Medical): No  Physical Activity: Inactive (03/10/2020)   Exercise Vital Sign    Days of Exercise per Week: 0 days    Minutes of Exercise per Session: 0 min  Stress: No Stress Concern Present (03/10/2020)   [employer] of Occupational Health - Occupational Stress Questionnaire    Feeling of Stress : Not at all  Social Connections: Moderately Integrated (03/10/2020)   Social Connection and Isolation Panel [NHANES]    Frequency of Communication with Friends and Family: More than three times a week    Frequency of Social Gatherings with Friends and Family: Never    Attends Religious Services: More than 4 times per year    Active Member of [employer] or Organizations: No    Attends Banker Meetings: Never    Marital Status: Married  Catering manager Violence: Not At Risk (03/10/2020)   Humiliation, Afraid, Rape, and Kick questionnaire    Fear of Current or Ex-Partner: No    Emotionally Abused: No    Physically Abused: No    Sexually  Abused: No    Family History: Family History  Problem Relation Age of Onset   Lung cancer Mother    Diabetes Mother    Hypertension Father        And siblings   Heart disease Father        And second-degree relatives   Transient ischemic attack Father    Brain cancer Sister    Heart disease Sister    Heart disease Sister    Thyroid disease Sister    Diabetes Brother    Atrial fibrillation Brother    Ulcers Son    Diabetes Son    Diabetes Son    Colon cancer Neg Hx    Stomach cancer Neg Hx    Rectal cancer Neg Hx     Current Medications:  Current Outpatient Medications:    aspirin 81 MG tablet, Take 81 mg by mouth every evening., Disp: , Rfl:    chlorthalidone (HYGROTON) 25 MG tablet, TAKE 1/2 TABLET(12.5 MG) BY MOUTH DAILY, Disp: 30 tablet, Rfl: 3   clopidogrel (PLAVIX) 75 MG tablet, TAKE 1 TABLET(75 MG) BY MOUTH DAILY WITH BREAKFAST, Disp: 90 tablet, Rfl: 3   Cyanocobalamin (VITAMIN B-12) 500 MCG SUBL, Place 500 mcg under the tongue daily., Disp: , Rfl:    loperamide (IMODIUM A-D) 2 MG tablet, Take 2 mg by mouth 4 (four) times daily as needed for diarrhea or loose stools., Disp: , Rfl:    losartan (COZAAR) 25 MG tablet, Take 1 tablet (25 mg total) by mouth daily., Disp: 90 tablet, Rfl: 3   metoprolol succinate (TOPROL-XL) 100 MG 24 hr tablet, TAKE 1 TABLET(100 MG) BY MOUTH DAILY WITH OR IMMEDIATELY FOLLOWING A MEAL, Disp: 30 tablet, Rfl: 1   Omega-3 Fatty Acids (FISH OIL) 1000 MG CAPS, Take 1,000 mg by mouth daily., Disp: , Rfl:    omeprazole (PRILOSEC) 20 MG capsule, Take 20 mg by mouth every other day., Disp: , Rfl:    pravastatin (PRAVACHOL) 40 MG tablet, TAKE 1 TABLET(40 MG) BY MOUTH DAILY, Disp: 90 tablet, Rfl: 3   traMADol (ULTRAM) 50 MG  tablet, TAKE 1 TABLET BY MOUTH EVERY 8 HOURS AS NEEDED, Disp: 90 tablet, Rfl: 0   nitroGLYCERIN (NITROSTAT) 0.4 MG SL tablet, Place 1 tablet (0.4 mg total) under the tongue every 5 (five) minutes as needed for chest pain. (Patient not  taking: Reported on 12/11/2022), Disp: 25 tablet, Rfl: 1   Allergies: Allergies  Allergen Reactions   Cardizem [Diltiazem Hcl] Hives and Rash    REVIEW OF SYSTEMS:   Review of Systems  Constitutional:  Negative for chills, fatigue and fever.  HENT:   Negative for lump/mass, mouth sores, nosebleeds, sore throat and trouble swallowing.   Eyes:  Negative for eye problems.  Respiratory:  Negative for cough and shortness of breath.   Cardiovascular:  Negative for chest pain, leg swelling and palpitations.  Gastrointestinal:  Positive for constipation and diarrhea. Negative for abdominal pain, nausea and vomiting.  Genitourinary:  Negative for bladder incontinence, difficulty urinating, dysuria, frequency, hematuria and nocturia.   Musculoskeletal:  Negative for arthralgias, back pain, flank pain, myalgias and neck pain.  Skin:  Negative for itching and rash.  Neurological:  Positive for numbness (fingertips). Negative for dizziness and headaches.  Hematological:  Does not bruise/bleed easily.  Psychiatric/Behavioral:  Negative for depression, sleep disturbance and suicidal ideas. The patient is not nervous/anxious.   All other systems reviewed and are negative.    VITALS:   Blood pressure (!) 123/59, pulse 73, temperature 98.2 F (36.8 C), temperature source Oral, resp. rate 16, weight 205 lb 1.6 oz (93 kg), SpO2 95%.  Wt Readings from Last 3 Encounters:  12/11/22 205 lb 1.6 oz (93 kg)  11/30/22 205 lb (93 kg)  11/24/22 203 lb (92.1 kg)    Body mass index is 32.12 kg/m.  Performance status (ECOG): 1 - Symptomatic but completely ambulatory  PHYSICAL EXAM:   Physical Exam Vitals and nursing note reviewed. Exam conducted with a chaperone present.  Constitutional:      Appearance: Normal appearance.  Cardiovascular:     Rate and Rhythm: Normal rate and regular rhythm.     Pulses: Normal pulses.     Heart sounds: Normal heart sounds.  Pulmonary:     Effort: Pulmonary effort is  normal.     Breath sounds: Normal breath sounds.  Abdominal:     Palpations: Abdomen is soft. There is no hepatomegaly, splenomegaly or mass.     Tenderness: There is no abdominal tenderness.  Musculoskeletal:     Right lower leg: No edema.     Left lower leg: No edema.  Lymphadenopathy:     Cervical: No cervical adenopathy.     Right cervical: No superficial, deep or posterior cervical adenopathy.    Left cervical: No superficial, deep or posterior cervical adenopathy.     Upper Body:     Right upper body: No supraclavicular or axillary adenopathy.     Left upper body: No supraclavicular or axillary adenopathy.  Neurological:     General: No focal deficit present.     Mental Status: He is alert and oriented to person, place, and time.  Psychiatric:        Mood and Affect: Mood normal.        Behavior: Behavior normal.     LABS:      Latest Ref Rng & Units 12/11/2022    2:07 PM 11/06/2022    1:50 PM 10/04/2022    1:01 PM  CBC  WBC 4.0 - 10.5 K/uL 6.3  7.4  7.2   Hemoglobin 13.0 -  17.0 g/dL 7.6  8.1  8.4   Hematocrit 39.0 - 52.0 % 23.3  25.2  26.1   Platelets 150 - 400 K/uL 318  335  372       Latest Ref Rng & Units 08/16/2022    2:06 PM 07/04/2022    2:29 PM 05/24/2022    1:51 PM  CMP  Glucose 70 - 99 mg/dL 90  102  98   BUN 8 - 23 mg/dL 23  23  21    Creatinine 0.61 - 1.24 mg/dL 7.25  3.66  4.40   Sodium 135 - 145 mmol/L 132  132  136   Potassium 3.5 - 5.1 mmol/L 3.7  3.5  3.7   Chloride 98 - 111 mmol/L 99  99  103   CO2 22 - 32 mmol/L 22  22  25    Calcium 8.9 - 10.3 mg/dL 9.0  8.7  9.0   Total Protein 6.5 - 8.1 g/dL 7.7  7.5  7.7   Total Bilirubin 0.3 - 1.2 mg/dL 0.7  0.5  0.4   Alkaline Phos 38 - 126 U/L 50  50  52   AST 15 - 41 U/L 16  16  17    ALT 0 - 44 U/L 12  12  12       No results found for: "CEA1", "CEA" / No results found for: "CEA1", "CEA" No results found for: "PSA1" No results found for: "HKV425" No results found for: "CAN125"  Lab Results   Component Value Date   TOTALPROTELP 7.2 10/04/2022   ALBUMINELP 4.0 08/16/2022   A1GS 0.2 08/16/2022   A2GS 0.7 08/16/2022   BETS 1.0 08/16/2022   GAMS 1.3 08/16/2022   MSPIKE Not Observed 08/16/2022   SPEI Comment 08/16/2022   Lab Results  Component Value Date   TIBC 231 (L) 12/11/2022   TIBC 241 (L) 11/06/2022   TIBC 263 10/04/2022   FERRITIN 121 12/11/2022   FERRITIN 88 11/06/2022   FERRITIN 81 10/04/2022   IRONPCTSAT 65 (H) 12/11/2022   IRONPCTSAT 66 (H) 11/06/2022   IRONPCTSAT 49 (H) 10/04/2022   Lab Results  Component Value Date   LDH 131 12/11/2022   LDH 126 10/04/2022   LDH 138 08/16/2022     STUDIES:   US Carotid Duplex Bilateral  Result Date: 11/23/2022 CLINICAL DATA:  carotis stenosis EXAM: BILATERAL CAROTID DUPLEX ULTRASOUND TECHNIQUE: Wallace Cullens scale imaging, color Doppler and duplex ultrasound were performed of bilateral carotid and vertebral arteries in the neck. COMPARISON:  05/20/2018 FINDINGS: Criteria: Quantification of carotid stenosis is based on velocity parameters that correlate the residual internal carotid diameter with NASCET-based stenosis levels, using the diameter of the distal internal carotid lumen as the denominator for stenosis measurement. The following velocity measurements were obtained: RIGHT ICA: 147/30 cm/sec CCA: 139/14 cm/sec SYSTOLIC ICA/CCA RATIO:  1.1 ECA: 234 cm/sec LEFT ICA: 193/27 cm/sec CCA: 85/11 cm/sec SYSTOLIC ICA/CCA RATIO:  2.3 ECA: 140 cm/sec RIGHT CAROTID ARTERY: Intimal thickening in the common carotid artery. Partially calcified plaque through the bifurcation and into the proximal ICA resulting in at least mild stenosis. Elevated peak systolic velocities are recorded just distal to the plaque. Otherwise normal waveforms and color Doppler signal. RIGHT VERTEBRAL ARTERY:  Normal flow direction and waveform. LEFT CAROTID ARTERY: Eccentric plaque in the common carotid artery with mild stenosis. Or heavily calcified plaque in the bulb  and proximal ICA resulting in at least mild stenosis. Elevated peak systolic velocities recorded in the bulb. Elsewhere normal waveforms and  color Doppler signal. LEFT VERTEBRAL ARTERY:  Normal flow direction and waveform. IMPRESSION: 1. 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 2. Antegrade bilateral vertebral arterial flow. Electronically Signed   By: Corlis Leak M.D.   On: 11/23/2022 18:30

## 2022-12-12 ENCOUNTER — Other Ambulatory Visit: Payer: Self-pay

## 2022-12-12 DIAGNOSIS — D539 Nutritional anemia, unspecified: Secondary | ICD-10-CM | POA: Diagnosis not present

## 2022-12-12 DIAGNOSIS — K219 Gastro-esophageal reflux disease without esophagitis: Secondary | ICD-10-CM | POA: Diagnosis not present

## 2022-12-12 DIAGNOSIS — I2510 Atherosclerotic heart disease of native coronary artery without angina pectoris: Secondary | ICD-10-CM | POA: Diagnosis not present

## 2022-12-12 DIAGNOSIS — K5730 Diverticulosis of large intestine without perforation or abscess without bleeding: Secondary | ICD-10-CM | POA: Diagnosis not present

## 2022-12-12 DIAGNOSIS — D123 Benign neoplasm of transverse colon: Secondary | ICD-10-CM | POA: Diagnosis not present

## 2022-12-12 DIAGNOSIS — M2550 Pain in unspecified joint: Secondary | ICD-10-CM | POA: Diagnosis not present

## 2022-12-12 DIAGNOSIS — I6522 Occlusion and stenosis of left carotid artery: Secondary | ICD-10-CM | POA: Diagnosis not present

## 2022-12-12 DIAGNOSIS — E785 Hyperlipidemia, unspecified: Secondary | ICD-10-CM | POA: Diagnosis not present

## 2022-12-12 LAB — BPAM RBC
Blood Product Expiration Date: 202409032359
Unit Type and Rh: 1700

## 2022-12-12 LAB — TYPE AND SCREEN
ABO/RH(D): B POS
Antibody Screen: NEGATIVE
Unit division: 0

## 2022-12-12 LAB — PREPARE RBC (CROSSMATCH)

## 2022-12-12 NOTE — Progress Notes (Unsigned)
One unit of blood ordered for 12-13-22 per MD.

## 2022-12-13 ENCOUNTER — Encounter: Payer: Self-pay | Admitting: Gastroenterology

## 2022-12-13 ENCOUNTER — Inpatient Hospital Stay: Payer: Medicare PPO

## 2022-12-13 DIAGNOSIS — I2510 Atherosclerotic heart disease of native coronary artery without angina pectoris: Secondary | ICD-10-CM | POA: Diagnosis not present

## 2022-12-13 DIAGNOSIS — M2550 Pain in unspecified joint: Secondary | ICD-10-CM | POA: Diagnosis not present

## 2022-12-13 DIAGNOSIS — I6522 Occlusion and stenosis of left carotid artery: Secondary | ICD-10-CM | POA: Diagnosis not present

## 2022-12-13 DIAGNOSIS — D649 Anemia, unspecified: Secondary | ICD-10-CM

## 2022-12-13 DIAGNOSIS — D123 Benign neoplasm of transverse colon: Secondary | ICD-10-CM | POA: Diagnosis not present

## 2022-12-13 DIAGNOSIS — E785 Hyperlipidemia, unspecified: Secondary | ICD-10-CM | POA: Diagnosis not present

## 2022-12-13 DIAGNOSIS — D539 Nutritional anemia, unspecified: Secondary | ICD-10-CM | POA: Diagnosis not present

## 2022-12-13 DIAGNOSIS — K5730 Diverticulosis of large intestine without perforation or abscess without bleeding: Secondary | ICD-10-CM | POA: Diagnosis not present

## 2022-12-13 DIAGNOSIS — K219 Gastro-esophageal reflux disease without esophagitis: Secondary | ICD-10-CM | POA: Diagnosis not present

## 2022-12-13 MED ORDER — SODIUM CHLORIDE 0.9% IV SOLUTION
250.0000 mL | Freq: Once | INTRAVENOUS | Status: AC
  Administered 2022-12-13: 250 mL via INTRAVENOUS

## 2022-12-13 MED ORDER — ACETAMINOPHEN 325 MG PO TABS: 650.0000 mg | ORAL_TABLET | Freq: Once | ORAL | Status: DC

## 2022-12-13 MED ORDER — DIPHENHYDRAMINE HCL 25 MG PO CAPS: 25.0000 mg | ORAL_CAPSULE | Freq: Once | ORAL | Status: DC

## 2022-12-18 ENCOUNTER — Inpatient Hospital Stay (HOSPITAL_BASED_OUTPATIENT_CLINIC_OR_DEPARTMENT_OTHER): Payer: Medicare PPO | Admitting: Hematology

## 2022-12-18 ENCOUNTER — Inpatient Hospital Stay: Payer: Medicare PPO

## 2022-12-18 VITALS — BP 122/64 | HR 67 | Temp 97.9°F | Resp 18

## 2022-12-18 DIAGNOSIS — D123 Benign neoplasm of transverse colon: Secondary | ICD-10-CM | POA: Diagnosis not present

## 2022-12-18 DIAGNOSIS — I2510 Atherosclerotic heart disease of native coronary artery without angina pectoris: Secondary | ICD-10-CM | POA: Diagnosis not present

## 2022-12-18 DIAGNOSIS — D649 Anemia, unspecified: Secondary | ICD-10-CM

## 2022-12-18 DIAGNOSIS — E785 Hyperlipidemia, unspecified: Secondary | ICD-10-CM | POA: Diagnosis not present

## 2022-12-18 DIAGNOSIS — K5730 Diverticulosis of large intestine without perforation or abscess without bleeding: Secondary | ICD-10-CM | POA: Diagnosis not present

## 2022-12-18 DIAGNOSIS — I6522 Occlusion and stenosis of left carotid artery: Secondary | ICD-10-CM | POA: Diagnosis not present

## 2022-12-18 DIAGNOSIS — K219 Gastro-esophageal reflux disease without esophagitis: Secondary | ICD-10-CM | POA: Diagnosis not present

## 2022-12-18 DIAGNOSIS — M2550 Pain in unspecified joint: Secondary | ICD-10-CM | POA: Diagnosis not present

## 2022-12-18 DIAGNOSIS — D539 Nutritional anemia, unspecified: Secondary | ICD-10-CM | POA: Diagnosis not present

## 2022-12-18 LAB — CBC WITH DIFFERENTIAL/PLATELET
Abs Immature Granulocytes: 0.03 10*3/uL (ref 0.00–0.07)
Basophils Absolute: 0 10*3/uL (ref 0.0–0.1)
Basophils Relative: 1 %
Eosinophils Absolute: 0.2 10*3/uL (ref 0.0–0.5)
Eosinophils Relative: 3 %
HCT: 26.5 % — ABNORMAL LOW (ref 39.0–52.0)
Hemoglobin: 8.9 g/dL — ABNORMAL LOW (ref 13.0–17.0)
Immature Granulocytes: 0 %
Lymphocytes Relative: 25 %
Lymphs Abs: 1.9 10*3/uL (ref 0.7–4.0)
MCH: 35.2 pg — ABNORMAL HIGH (ref 26.0–34.0)
MCHC: 33.6 g/dL (ref 30.0–36.0)
MCV: 104.7 fL — ABNORMAL HIGH (ref 80.0–100.0)
Monocytes Absolute: 0.8 10*3/uL (ref 0.1–1.0)
Monocytes Relative: 10 %
Neutro Abs: 4.6 10*3/uL (ref 1.7–7.7)
Neutrophils Relative %: 61 %
Platelets: 298 10*3/uL (ref 150–400)
RBC: 2.53 MIL/uL — ABNORMAL LOW (ref 4.22–5.81)
RDW: 22.5 % — ABNORMAL HIGH (ref 11.5–15.5)
WBC: 7.5 10*3/uL (ref 4.0–10.5)
nRBC: 0.4 % — ABNORMAL HIGH (ref 0.0–0.2)

## 2022-12-18 LAB — SAMPLE TO BLOOD BANK

## 2022-12-18 NOTE — Progress Notes (Signed)
INDICATION: Macrocytic anemia   Bone Marrow Biopsy and Aspiration Procedure Note   The patient was identified by name and date of birth, prior to start of the procedure and a timeout was performed.   An informed consent was obtained after discussing potential risks including bleeding, infection and pain.  The right posterior iliac crest was palpated, cleaned with ChloraPrep, and drapes applied.  1% lidocaine is infiltrated into the skin, subcutaneous tissue and periosteum.  Bone marrow was aspirated and smears made.  With the help of Jamshidi needle a core biopsy was obtained.  Pressure was applied to the biopsy site and bandage was placed over the biopsy site. Patient was made to lie on the back for 15 mins prior to discharge.  The procedure was tolerated well. COMPLICATIONS: None BLOOD LOSS: none Patient was discharged home in stable condition to return in 2 weeks to review results.  Patient was provided with post bone marrow biopsy instructions and instructed to call if there was any bleeding or worsening pain.  Specimens sent for flow cytometry, cytogenetics and additional studies.  Signed Doreatha Massed, MD

## 2022-12-18 NOTE — Progress Notes (Signed)
Patient here today for bone marrow biopsy. Procedure explained and consent signed by all parties at 0745. Patient placed in prone position with both arms above head 0747. Time out conducted at 0750 ,patient and team agreed. Procedure started at 0751. Patient tolerated procedure well with minimal pain and discomfort. Specimens collected and labeled appropriately. Procedure completed at 0805. Dressing applied and patient reposition on back, head of bed elevated, resting at 0806. Specimens taken to lab for processing. Patient remained stable during procedure and discharged home in stable condition with family member at [phone].

## 2022-12-18 NOTE — Progress Notes (Signed)
[facility] [address], [place] [address]    Clinic Day:  12/18/2022  Referring physician: Karie Schwalbe, MD  Patient Care Team: Karie Schwalbe, MD as PCP - General (Internal Medicine) Wyline Mood Dorothe Pea, MD as PCP - Cardiology (Cardiology) Doreatha Massed, MD as Medical Oncologist (Hematology)   ASSESSMENT & PLAN:   Assessment: 1.  Hereditary hemochromatosis: -Patient seen for elevated ferritin levels. -EMR evaluation shows hemochromatosis testing on 01/27/2019 which showed heterozygosity for C282Y and H63D variants. -Given the elevated ferritin levels above 1000 and the results of genetic testing, this is compatible with hereditary hemochromatosis. -Patient does have arthritis of the small joints of the hands and arthritis of the back. -No history of CHF but has CAD.  No diabetes. -MRI of the liver on 12/11/2019 shows estimated liver iron concentration approximately 7.7 mg/g compatible with moderate liver iron.  Estimated fat fraction ranges from 7-12%, more pronounced in the right lobe compared to left hepatic lobe.  No focal hepatic lesions seen.  No morphological changes of significant liver disease. -Jadenu 540 mg daily started on 12/24/2019, dose increased to 720 mg daily on 02/09/2020.  - MRI liver (09/21/2021): Diminished liver iron concentration less than 2 mg/g, slightly below "mild" designation (previously well into the "moderate" range). - Dose decreased to 2 tablets daily on 07/04/2022   2.  Macrocytic anemia: -Colonoscopy by Dr. Myrtie Neither on 02/17/2019 shows diverticulosis in the left colon, 12 mm polyp in the cecum, 4 to 6 mm polyp in the ascending colon and 5 mm polyp in the descending colon.  Pathology was consistent with tubular adenomas. - Colonoscopy (11/30/2022): 8 mm polyp in the transverse colon.  Multiple diverticula found in the left colon.  Internal hemorrhoids were found.  Exam was otherwise normal. - EGD (11/30/2022): Normal  esophagus.  Multiple erosions with no bleeding and no stigmata of recent bleeding in the prepyloric region of the stomach. - Serum EPO level 54.4  Plan: 1.  Hereditary hemochromatosis: - Jadenu on hold since 09/13/2022 due to severe anemia. - Ferritin is 121 with percent saturation 65.  Will closely monitor.   2.  Macrocytic anemia: - Previous workup for nutritional deficiencies were negative.  As his stool cards were positive, he underwent EGD and colonoscopy on 11/30/2022 which did not reveal any active bleeding source. - Serum EPO level is 54.4. - CBC today shows normal white count and platelet count.  Hemoglobin improved to 8.9 after blood transfusion on 12/12/2022. - We talked about the need for bone marrow aspiration and biopsy to evaluate for underlying MDS. - We discussed the procedure in detail including rare complication of infection and bleeding. - Will proceed with biopsy today.  Will also send NGS panel on peripheral blood. - Will ask for cytogenetics and FISH for MDS if positive. - RTC 2 weeks for follow-up.       Orders Placed This Encounter  Procedures   CBC with Differential/Platelet    Standing Status:   Future    Number of Occurrences:   1    Standing Expiration Date:   12/18/2023    Order Specific Question:   Release to patient    Answer:   Immediate   Sample to Blood Bank(Blood Bank Hold)    Standing Status:   Future    Number of Occurrences:   1    Standing Expiration Date:   12/18/2023      I, Doreatha Massed MD, have reviewed the above documentation  for accuracy and completeness, and I agree with the above.    Doreatha Massed, MD   8/12/20248:32 AM  CHIEF COMPLAINT:   Diagnosis: hereditary hemochromatosis    Cancer Staging  No matching staging information was found for the patient.    Prior Therapy: none  Current Therapy:  Deferasirox 560 mg daily    HISTORY OF PRESENT ILLNESS:   Oncology History   No history exists.     INTERVAL  HISTORY:   Aaron Sexton is a 77 y.o. male seen for follow-up of hereditary hemochromatosis and macrocytic anemia.  Denies any bleeding per rectum or melena.  Denies any chest pain or lightheadedness.  Energy levels are 70% and appetite is 100%.  PAST MEDICAL HISTORY:   Past Medical History: 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   Cerebrovascular disease    Moderate ASVD without focal stenosis in 01/2007   Clotting disorder (HCC)    Colon polyps 07/2010   single 2mm polyp--tubular adenoma   Diverticulosis 2012   found on colonoscopy   Erectile dysfunction    Family history of adverse reaction to anesthesia    difficult for son & sistor to wake    GERD (gastroesophageal reflux disease)    Hematochezia 05/24/2010   Hyperlipidemia    Hypertension    Osteoarthritis    knees/hands-Dr Lucey   Overweight(278.02)    Peripheral vascular disease (HCC) 03/11/2010   Moderate SFA stenosis; history of claudication   Pneumonia    Rosacea    Vitamin B12 deficiency     Surgical History: Past Surgical History:  Procedure Laterality Date   APPENDECTOMY     APPLICATION OF WOUND VAC Left 12/17/2021   Procedure: APPLICATION OF PREVENA WOUND VAC LEFT GROIN;  Surgeon: Victorino Sparrow, MD;  Location: [facility] OR;  Service: Vascular;  Laterality: Left;   APPLICATION OF WOUND VAC Left 01/08/2022   Procedure: APPLICATION OF WOUND VAC left groin.;  Surgeon: Cephus Shelling, MD;  Location: [facility] OR;  Service: Vascular;  Laterality: Left;   COLONOSCOPY W/ POLYPECTOMY  2012   ENDARTERECTOMY FEMORAL Left 12/09/2021   Procedure: LEFT COMMON FEMORAL ENDARTERECTOMY WITH 1 CM X 6 CM XENOSURE BOVINE PATCH ANGIOPLASTY;  Surgeon: Cephus Shelling, MD;  Location: [facility] OR;  Service: Vascular;  Laterality: Left;   FEMORAL-POPLITEAL BYPASS GRAFT Left 12/09/2021   Procedure: LEFT FEMORAL- ABOVE KNEE POPLITEAL BYPASS  WITH 6 mm x 80 cm PROPATEN GRAFT;  Surgeon: Cephus Shelling, MD;  Location: [facility] OR;  Service: Vascular;  Laterality: Left;  INSERT ARTERIAL LINE   ILIAC VEIN ANGIOPLASTY / STENTING Right 08/09/2015   INCISION AND DRAINAGE OF WOUND Left 01/08/2022   Procedure: IRRIGATION AND DEBRIDEMENT GROIN LEFT application of Myriad Morcells.;  Surgeon: Cephus Shelling, MD;  Location: [facility] OR;  Service: Vascular;  Laterality: Left;   INSERTION OF ILIAC STENT  12/09/2021   Procedure: INSERTION OF LEFT COMMON AND EXTERNAL  ILIAC STENTS WITH ANGIOPLASTY;  Surgeon: Cephus Shelling, MD;  Location: [facility] OR;  Service: Vascular;;   LOWER EXTREMITY ANGIOGRAM Left 12/09/2021   Procedure: LEFT ILIAC AND LOWER EXTREMITY ANGIOGRAM;  Surgeon: Cephus Shelling, MD;  Location: [facility] OR;  Service: Vascular;  Laterality: Left;   LOWER EXTREMITY ANGIOGRAPHY Bilateral 11/03/2021   Procedure: Lower Extremity Angiography;  Surgeon: Runell Gess, MD;  Location: [facility] INVASIVE CV LAB;  Service: Cardiovascular;  Laterality:  Bilateral;  Limited Study   PERIPHERAL VASCULAR CATHETERIZATION Bilateral 06/21/2015   Procedure: Lower Extremity Angiography;  Surgeon: Runell Gess, MD;  Location: [facility] INVASIVE CV LAB;  Service: Cardiovascular;  Laterality: Bilateral;   PERIPHERAL VASCULAR CATHETERIZATION N/A 06/21/2015   Procedure: Abdominal Aortogram;  Surgeon: Runell Gess, MD;  Location: [facility] INVASIVE CV LAB;  Service: Cardiovascular;  Laterality: N/A;   PERIPHERAL VASCULAR CATHETERIZATION N/A 07/26/2015   Procedure: Lower Extremity Angiography;  Surgeon: Runell Gess, MD;  Location: [facility] INVASIVE CV LAB;  Service: Cardiovascular;  Laterality: N/A;   PERIPHERAL VASCULAR CATHETERIZATION N/A 08/09/2015   Procedure: Lower Extremity Angiography;  Surgeon: Runell Gess, MD;  Location: [facility] INVASIVE CV LAB;  Service: Cardiovascular;  Laterality: N/A;   PERIPHERAL VASCULAR CATHETERIZATION  08/09/2015   Procedure: Peripheral Vascular  Intervention;  Surgeon: Runell Gess, MD;  Location: [facility] INVASIVE CV LAB;  Service: Cardiovascular;;  rt ext. iliac atherectomy and stent   PERIPHERAL VASCULAR CATHETERIZATION N/A 09/20/2015   Procedure: Lower Extremity Angiography;  Surgeon: Runell Gess, MD;  Location: [facility] INVASIVE CV LAB;  Service: Cardiovascular;  Laterality: N/A;   PERIPHERAL VASCULAR CATHETERIZATION Right 09/20/2015   Procedure: Peripheral Vascular Intervention;  Surgeon: Runell Gess, MD;  Location: [facility] INVASIVE CV LAB;  Service: Cardiovascular;  Laterality: Right;  SFA   ROTATOR CUFF REPAIR Left 7/15   Dr Sherlean Foot   WOUND EXPLORATION Left 12/17/2021   Procedure: LEFT LEG WOUND EXPLORATION AND WASH OUT WITH MYRIAD MORCELLS;  Surgeon: Victorino Sparrow, MD;  Location: [facility] OR;  Service: Vascular;  Laterality: Left;    Social History: Social History   Socioeconomic History   Marital status: Married    Spouse name: Not on file   Number of children: 3   Years of education: Not on file   Highest education level: Not on file  Occupational History   Occupation: Managed supply chain--- retired 2013    Comment: [employer]  Tobacco Use   Smoking status: Former    Current packs/day: 0.00    Average packs/day: 1 pack/day for 30.0 years (30.0 ttl pk-yrs)    Types: Cigarettes    Start date: 05/16/1960    Quit date: 05/16/1990    Years since quitting: 32.6    Passive exposure: Never   Smokeless tobacco: Never   Tobacco comments:    Quit in 1993  Vaping Use   Vaping status: Never Used  Substance and Sexual Activity   Alcohol use: No    Alcohol/week: 0.0 standard drinks of alcohol   Drug use: No   Sexual activity: Not on file  Other Topics Concern   Not on file  Social History Narrative   Has living will   Wife is health care POA---alternate is son or daughter   Would accept resuscitation but no prolonged artificial ventilation.   No tube feeds if cognitively unaware   Social Determinants of  Health   Financial Resource Strain: Low Risk  (03/10/2020)   Overall Financial Resource Strain (CARDIA)    Difficulty of Paying Living Expenses: Not hard at all  Food Insecurity: No Food Insecurity (01/13/2022)   Hunger Vital Sign    Worried About Running Out of Food in the Last Year: Never true    Ran Out of Food in the Last Year: Never true  Transportation Needs: No Transportation Needs (01/13/2022)   PRAPARE - Administrator, Civil Service (Medical): No    Lack of Transportation (Non-Medical): No  Physical Activity: Inactive (03/10/2020)   Exercise Vital Sign    Days of Exercise per Week: 0 days    Minutes of Exercise per Session: 0 min  Stress: No Stress Concern Present (03/10/2020)   [employer] of Occupational Health - Occupational Stress Questionnaire    Feeling of Stress : Not at all  Social Connections: Moderately Integrated (03/10/2020)   Social Connection and Isolation Panel [NHANES]    Frequency of Communication with Friends and Family: More than three times a week    Frequency of Social Gatherings with Friends and Family: Never    Attends Religious Services: More than 4 times per year    Active Member of [employer] or Organizations: No    Attends Banker Meetings: Never    Marital Status: Married  Catering manager Violence: Not At Risk (03/10/2020)   Humiliation, Afraid, Rape, and Kick questionnaire    Fear of Current or Ex-Partner: No    Emotionally Abused: No    Physically Abused: No    Sexually Abused: No    Family History: Family History  Problem Relation Age of Onset   Lung cancer Mother    Diabetes Mother    Hypertension Father        And siblings   Heart disease Father        And second-degree relatives   Transient ischemic attack Father    Brain cancer Sister    Heart disease Sister    Heart disease Sister    Thyroid disease Sister    Diabetes Brother    Atrial fibrillation Brother    Ulcers Son    Diabetes Son    Diabetes Son     Colon cancer Neg Hx    Stomach cancer Neg Hx    Rectal cancer Neg Hx     Current Medications:  Current Outpatient Medications:    aspirin 81 MG tablet, Take 81 mg by mouth every evening., Disp: , Rfl:    chlorthalidone (HYGROTON) 25 MG tablet, TAKE 1/2 TABLET(12.5 MG) BY MOUTH DAILY, Disp: 30 tablet, Rfl: 3   clopidogrel (PLAVIX) 75 MG tablet, TAKE 1 TABLET(75 MG) BY MOUTH DAILY WITH BREAKFAST, Disp: 90 tablet, Rfl: 3   Cyanocobalamin (VITAMIN B-12) 500 MCG SUBL, Place 500 mcg under the tongue daily., Disp: , Rfl:    loperamide (IMODIUM A-D) 2 MG tablet, Take 2 mg by mouth 4 (four) times daily as needed for diarrhea or loose stools., Disp: , Rfl:    losartan (COZAAR) 25 MG tablet, Take 1 tablet (25 mg total) by mouth daily., Disp: 90 tablet, Rfl: 3   metoprolol succinate (TOPROL-XL) 100 MG 24 hr tablet, TAKE 1 TABLET(100 MG) BY MOUTH DAILY WITH OR IMMEDIATELY FOLLOWING A MEAL, Disp: 30 tablet, Rfl: 1   nitroGLYCERIN (NITROSTAT) 0.4 MG SL tablet, Place 1 tablet (0.4 mg total) under the tongue every 5 (five) minutes as needed for chest pain. (Patient not taking: Reported on 12/11/2022), Disp: 25 tablet, Rfl: 1   Omega-3 Fatty Acids (FISH OIL) 1000 MG CAPS, Take 1,000 mg by mouth daily., Disp: , Rfl:    omeprazole (PRILOSEC) 20 MG capsule, Take 20 mg by mouth every other day., Disp: , Rfl:    pravastatin (PRAVACHOL) 40 MG tablet, TAKE 1 TABLET(40 MG) BY MOUTH DAILY, Disp: 90 tablet, Rfl: 3   traMADol (ULTRAM) 50 MG tablet, TAKE 1 TABLET BY MOUTH EVERY 8 HOURS AS NEEDED, Disp: 90 tablet, Rfl: 0   Allergies: Allergies  Allergen Reactions   Cardizem [  Diltiazem Hcl] Hives and Rash    REVIEW OF SYSTEMS:   Review of Systems  Constitutional:  Negative for chills, fatigue and fever.  HENT:   Negative for lump/mass, mouth sores, nosebleeds, sore throat and trouble swallowing.   Eyes:  Negative for eye problems.  Respiratory:  Negative for cough and shortness of breath.   Cardiovascular:   Negative for chest pain, leg swelling and palpitations.  Gastrointestinal:  Positive for constipation and diarrhea. Negative for abdominal pain, nausea and vomiting.  Genitourinary:  Negative for bladder incontinence, difficulty urinating, dysuria, frequency, hematuria and nocturia.   Musculoskeletal:  Negative for arthralgias, back pain, flank pain, myalgias and neck pain.  Skin:  Negative for itching and rash.  Neurological:  Positive for numbness (fingertips). Negative for dizziness and headaches.  Hematological:  Does not bruise/bleed easily.  Psychiatric/Behavioral:  Negative for depression, sleep disturbance and suicidal ideas. The patient is not nervous/anxious.   All other systems reviewed and are negative.    VITALS:   Blood pressure 122/64, pulse 67, temperature 97.9 F (36.6 C), temperature source Oral, resp. rate 18, SpO2 95%.  Wt Readings from Last 3 Encounters:  12/11/22 205 lb 1.6 oz (93 kg)  11/30/22 205 lb (93 kg)  11/24/22 203 lb (92.1 kg)    There is no height or weight on file to calculate BMI.  Performance status (ECOG): 1 - Symptomatic but completely ambulatory  PHYSICAL EXAM:   Physical Exam Vitals and nursing note reviewed. Exam conducted with a chaperone present.  Constitutional:      Appearance: Normal appearance.  Cardiovascular:     Rate and Rhythm: Normal rate and regular rhythm.     Pulses: Normal pulses.     Heart sounds: Normal heart sounds.  Pulmonary:     Effort: Pulmonary effort is normal.     Breath sounds: Normal breath sounds.  Abdominal:     Palpations: Abdomen is soft. There is no hepatomegaly, splenomegaly or mass.     Tenderness: There is no abdominal tenderness.  Musculoskeletal:     Right lower leg: No edema.     Left lower leg: No edema.  Lymphadenopathy:     Cervical: No cervical adenopathy.     Right cervical: No superficial, deep or posterior cervical adenopathy.    Left cervical: No superficial, deep or posterior cervical  adenopathy.     Upper Body:     Right upper body: No supraclavicular or axillary adenopathy.     Left upper body: No supraclavicular or axillary adenopathy.  Neurological:     General: No focal deficit present.     Mental Status: He is alert and oriented to person, place, and time.  Psychiatric:        Mood and Affect: Mood normal.        Behavior: Behavior normal.     LABS:      Latest Ref Rng & Units 12/11/2022    2:07 PM 11/06/2022    1:50 PM 10/04/2022    1:01 PM  CBC  WBC 4.0 - 10.5 K/uL 6.3  7.4  7.2   Hemoglobin 13.0 - 17.0 g/dL 7.6  8.1  8.4   Hematocrit 39.0 - 52.0 % 23.3  25.2  26.1   Platelets 150 - 400 K/uL 318  335  372       Latest Ref Rng & Units 08/16/2022    2:06 PM 07/04/2022    2:29 PM 05/24/2022    1:51 PM  CMP  Glucose 70 -  99 mg/dL 90  875  98   BUN 8 - 23 mg/dL 23  23  21    Creatinine 0.61 - 1.24 mg/dL 6.43  3.29  5.18   Sodium 135 - 145 mmol/L 132  132  136   Potassium 3.5 - 5.1 mmol/L 3.7  3.5  3.7   Chloride 98 - 111 mmol/L 99  99  103   CO2 22 - 32 mmol/L 22  22  25    Calcium 8.9 - 10.3 mg/dL 9.0  8.7  9.0   Total Protein 6.5 - 8.1 g/dL 7.7  7.5  7.7   Total Bilirubin 0.3 - 1.2 mg/dL 0.7  0.5  0.4   Alkaline Phos 38 - 126 U/L 50  50  52   AST 15 - 41 U/L 16  16  17    ALT 0 - 44 U/L 12  12  12       No results found for: "CEA1", "CEA" / No results found for: "CEA1", "CEA" No results found for: "PSA1" No results found for: "CAN199" No results found for: "CAN125"  Lab Results  Component Value Date   TOTALPROTELP 7.2 10/04/2022   ALBUMINELP 4.0 08/16/2022   A1GS 0.2 08/16/2022   A2GS 0.7 08/16/2022   BETS 1.0 08/16/2022   GAMS 1.3 08/16/2022   MSPIKE Not Observed 08/16/2022   SPEI Comment 08/16/2022   Lab Results  Component Value Date   TIBC 231 (L) 12/11/2022   TIBC 241 (L) 11/06/2022   TIBC 263 10/04/2022   FERRITIN 121 12/11/2022   FERRITIN 88 11/06/2022   FERRITIN 81 10/04/2022   IRONPCTSAT 65 (H) 12/11/2022   IRONPCTSAT 66 (H)  11/06/2022   IRONPCTSAT 49 (H) 10/04/2022   Lab Results  Component Value Date   LDH 131 12/11/2022   LDH 126 10/04/2022   LDH 138 08/16/2022     STUDIES:   US Carotid Duplex Bilateral  Result Date: 11/23/2022 CLINICAL DATA:  carotis stenosis EXAM: BILATERAL CAROTID DUPLEX ULTRASOUND TECHNIQUE: Wallace Cullens scale imaging, color Doppler and duplex ultrasound were performed of bilateral carotid and vertebral arteries in the neck. COMPARISON:  05/20/2018 FINDINGS: Criteria: Quantification of carotid stenosis is based on velocity parameters that correlate the residual internal carotid diameter with NASCET-based stenosis levels, using the diameter of the distal internal carotid lumen as the denominator for stenosis measurement. The following velocity measurements were obtained: RIGHT ICA: 147/30 cm/sec CCA: 139/14 cm/sec SYSTOLIC ICA/CCA RATIO:  1.1 ECA: 234 cm/sec LEFT ICA: 193/27 cm/sec CCA: 85/11 cm/sec SYSTOLIC ICA/CCA RATIO:  2.3 ECA: 140 cm/sec RIGHT CAROTID ARTERY: Intimal thickening in the common carotid artery. Partially calcified plaque through the bifurcation and into the proximal ICA resulting in at least mild stenosis. Elevated peak systolic velocities are recorded just distal to the plaque. Otherwise normal waveforms and color Doppler signal. RIGHT VERTEBRAL ARTERY:  Normal flow direction and waveform. LEFT CAROTID ARTERY: Eccentric plaque in the common carotid artery with mild stenosis. Or heavily calcified plaque in the bulb and proximal ICA resulting in at least mild stenosis. Elevated peak systolic velocities recorded in the bulb. Elsewhere normal waveforms and color Doppler signal. LEFT VERTEBRAL ARTERY:  Normal flow direction and waveform. IMPRESSION: 1. 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 2. Antegrade bilateral vertebral arterial flow. Electronically Signed   By: Corlis Leak M.D.   On: 11/23/2022 18:30

## 2022-12-25 ENCOUNTER — Encounter (HOSPITAL_COMMUNITY): Payer: Self-pay | Admitting: Hematology

## 2022-12-25 NOTE — Progress Notes (Unsigned)
Patient name: Aaron Sexton MRN: [id number] DOB: 1946-04-17 Sex: male  REASON FOR VISIT: 6 month follow-up for surveillance  HPI: Aaron Sexton is a 77 y.o. male with multiple medical comorbidities who presents for 6 month follow-up of his PAD.  He underwent a left common femoral endarterectomy with a left common femoral to above-knee popliteal bypass with PTFE including left iliac artery stenting on 12/09/2021 for CLI with tissue loss.  He ultimately required washout of a hematoma on 12/17/2021.  He then had a breakdown of his left groin incision requiring I&D with myriad and a VAC on 01/08/2022.  His cultures grew Staph epidermidis.  His graft was not exposed.  We did treat him with 6 weeks of doxycycline.  His left groin ultimately healed.  He has no new complaints today.  He remains on aspirin Plavix.  No significant pain in his legs when he walks.  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   Cerebrovascular disease    Moderate ASVD without focal stenosis in 01/2007   Clotting disorder (HCC)    Colon polyps 07/2010   single 2mm polyp--tubular adenoma   Diverticulosis 2012   found on colonoscopy   Erectile dysfunction    Family history of adverse reaction to anesthesia    difficult for son & sistor to wake    GERD (gastroesophageal reflux disease)    Hematochezia 05/24/2010   Hyperlipidemia    Hypertension    Osteoarthritis    knees/hands-Dr Lucey   Overweight(278.02)    Peripheral vascular disease (HCC) 03/11/2010   Moderate SFA stenosis; history of claudication   Pneumonia    Rosacea    Vitamin B12 deficiency     Past Surgical History:  Procedure Laterality Date   APPENDECTOMY     APPLICATION OF WOUND VAC Left 12/17/2021   Procedure: APPLICATION OF PREVENA WOUND VAC LEFT GROIN;  Surgeon: Victorino Sparrow, MD;  Location: [facility] OR;  Service: Vascular;  Laterality:  Left;   APPLICATION OF WOUND VAC Left 01/08/2022   Procedure: APPLICATION OF WOUND VAC left groin.;  Surgeon: Cephus Shelling, MD;  Location: [facility] OR;  Service: Vascular;  Laterality: Left;   COLONOSCOPY W/ POLYPECTOMY  2012   ENDARTERECTOMY FEMORAL Left 12/09/2021   Procedure: LEFT COMMON FEMORAL ENDARTERECTOMY WITH 1 CM X 6 CM XENOSURE BOVINE PATCH ANGIOPLASTY;  Surgeon: Cephus Shelling, MD;  Location: [facility] OR;  Service: Vascular;  Laterality: Left;   FEMORAL-POPLITEAL BYPASS GRAFT Left 12/09/2021   Procedure: LEFT FEMORAL- ABOVE KNEE POPLITEAL BYPASS WITH 6 mm x 80 cm PROPATEN GRAFT;  Surgeon: Cephus Shelling, MD;  Location: [facility] OR;  Service: Vascular;  Laterality: Left;  INSERT ARTERIAL LINE   ILIAC VEIN ANGIOPLASTY / STENTING Right 08/09/2015   INCISION AND DRAINAGE OF WOUND Left 01/08/2022   Procedure: IRRIGATION AND DEBRIDEMENT GROIN LEFT application of Myriad Morcells.;  Surgeon: Cephus Shelling, MD;  Location: [facility] OR;  Service: Vascular;  Laterality: Left;   INSERTION OF ILIAC STENT  12/09/2021   Procedure: INSERTION OF LEFT COMMON AND EXTERNAL  ILIAC STENTS WITH ANGIOPLASTY;  Surgeon: Cephus Shelling, MD;  Location: [facility] OR;  Service: Vascular;;   LOWER EXTREMITY ANGIOGRAM Left 12/09/2021   Procedure: LEFT ILIAC AND LOWER EXTREMITY ANGIOGRAM;  Surgeon: Cephus Shelling, MD;  Location: [facility] OR;  Service: Vascular;  Laterality: Left;   LOWER EXTREMITY ANGIOGRAPHY  Bilateral 11/03/2021   Procedure: Lower Extremity Angiography;  Surgeon: Runell Gess, MD;  Location: [facility] INVASIVE CV LAB;  Service: Cardiovascular;  Laterality: Bilateral;  Limited Study   PERIPHERAL VASCULAR CATHETERIZATION Bilateral 06/21/2015   Procedure: Lower Extremity Angiography;  Surgeon: Runell Gess, MD;  Location: [facility] INVASIVE CV LAB;  Service: Cardiovascular;  Laterality: Bilateral;   PERIPHERAL VASCULAR CATHETERIZATION N/A 06/21/2015   Procedure: Abdominal Aortogram;  Surgeon: Runell Gess, MD;   Location: [facility] INVASIVE CV LAB;  Service: Cardiovascular;  Laterality: N/A;   PERIPHERAL VASCULAR CATHETERIZATION N/A 07/26/2015   Procedure: Lower Extremity Angiography;  Surgeon: Runell Gess, MD;  Location: [facility] INVASIVE CV LAB;  Service: Cardiovascular;  Laterality: N/A;   PERIPHERAL VASCULAR CATHETERIZATION N/A 08/09/2015   Procedure: Lower Extremity Angiography;  Surgeon: Runell Gess, MD;  Location: [facility] INVASIVE CV LAB;  Service: Cardiovascular;  Laterality: N/A;   PERIPHERAL VASCULAR CATHETERIZATION  08/09/2015   Procedure: Peripheral Vascular Intervention;  Surgeon: Runell Gess, MD;  Location: [facility] INVASIVE CV LAB;  Service: Cardiovascular;;  rt ext. iliac atherectomy and stent   PERIPHERAL VASCULAR CATHETERIZATION N/A 09/20/2015   Procedure: Lower Extremity Angiography;  Surgeon: Runell Gess, MD;  Location: [facility] INVASIVE CV LAB;  Service: Cardiovascular;  Laterality: N/A;   PERIPHERAL VASCULAR CATHETERIZATION Right 09/20/2015   Procedure: Peripheral Vascular Intervention;  Surgeon: Runell Gess, MD;  Location: [facility] INVASIVE CV LAB;  Service: Cardiovascular;  Laterality: Right;  SFA   ROTATOR CUFF REPAIR Left 7/15   Dr Sherlean Foot   WOUND EXPLORATION Left 12/17/2021   Procedure: LEFT LEG WOUND EXPLORATION AND WASH OUT WITH MYRIAD MORCELLS;  Surgeon: Victorino Sparrow, MD;  Location: [facility] OR;  Service: Vascular;  Laterality: Left;    Family History  Problem Relation Age of Onset   Lung cancer Mother    Diabetes Mother    Hypertension Father        And siblings   Heart disease Father        And second-degree relatives   Transient ischemic attack Father    Brain cancer Sister    Heart disease Sister    Heart disease Sister    Thyroid disease Sister    Diabetes Brother    Atrial fibrillation Brother    Ulcers Son    Diabetes Son    Diabetes Son    Colon cancer Neg Hx    Stomach cancer Neg Hx    Rectal cancer Neg Hx     SOCIAL HISTORY: Social History   Tobacco Use   Smoking  status: Former    Current packs/day: 0.00    Average packs/day: 1 pack/day for 30.0 years (30.0 ttl pk-yrs)    Types: Cigarettes    Start date: 05/16/1960    Quit date: 05/16/1990    Years since quitting: 32.6    Passive exposure: Never   Smokeless tobacco: Never   Tobacco comments:    Quit in 1993  Substance Use Topics   Alcohol use: No    Alcohol/week: 0.0 standard drinks of alcohol    Allergies  Allergen Reactions   Cardizem [Diltiazem Hcl] Hives and Rash    Current Outpatient Medications  Medication Sig Dispense Refill   aspirin 81 MG tablet Take 81 mg by mouth every evening.     chlorthalidone (HYGROTON) 25 MG tablet TAKE 1/2 TABLET(12.5 MG) BY MOUTH DAILY 30 tablet 3   clopidogrel (PLAVIX) 75 MG tablet TAKE 1 TABLET(75 MG) BY MOUTH DAILY WITH BREAKFAST 90  tablet 3   Cyanocobalamin (VITAMIN B-12) 500 MCG SUBL Place 500 mcg under the tongue daily.     loperamide (IMODIUM A-D) 2 MG tablet Take 2 mg by mouth 4 (four) times daily as needed for diarrhea or loose stools.     losartan (COZAAR) 25 MG tablet Take 1 tablet (25 mg total) by mouth daily. 90 tablet 3   metoprolol succinate (TOPROL-XL) 100 MG 24 hr tablet TAKE 1 TABLET(100 MG) BY MOUTH DAILY WITH OR IMMEDIATELY FOLLOWING A MEAL 30 tablet 1   nitroGLYCERIN (NITROSTAT) 0.4 MG SL tablet Place 1 tablet (0.4 mg total) under the tongue every 5 (five) minutes as needed for chest pain. (Patient not taking: Reported on 12/11/2022) 25 tablet 1   Omega-3 Fatty Acids (FISH OIL) 1000 MG CAPS Take 1,000 mg by mouth daily.     omeprazole (PRILOSEC) 20 MG capsule Take 20 mg by mouth every other day.     pravastatin (PRAVACHOL) 40 MG tablet TAKE 1 TABLET(40 MG) BY MOUTH DAILY 90 tablet 3   traMADol (ULTRAM) 50 MG tablet TAKE 1 TABLET BY MOUTH EVERY 8 HOURS AS NEEDED 90 tablet 0   No current facility-administered medications for this visit.    REVIEW OF SYSTEMS:  [X]  denotes positive finding, [ ]  denotes negative finding Cardiac  Comments:   Chest pain or chest pressure:    Shortness of breath upon exertion:    Short of breath when lying flat:    Irregular heart rhythm:        Vascular    Pain in calf, thigh, or hip brought on by ambulation:    Pain in feet at night that wakes you up from your sleep:     Blood clot in your veins:    Leg swelling:         Pulmonary    Oxygen at home:    Productive cough:     Wheezing:         Neurologic    Sudden weakness in arms or legs:     Sudden numbness in arms or legs:     Sudden onset of difficulty speaking or slurred speech:    Temporary loss of vision in one eye:     Problems with dizziness:         Gastrointestinal    Blood in stool:     Vomited blood:         Genitourinary    Burning when urinating:     Blood in urine:        Psychiatric    Major depression:         Hematologic    Bleeding problems:    Problems with blood clotting too easily:        Skin    Rashes or ulcers:        Constitutional    Fever or chills:      PHYSICAL EXAM: There were no vitals filed for this visit.   GENERAL: The patient is a well-nourished male, in no acute distress. The vital signs are documented above. CARDIAC: There is a regular rate and rhythm.  VASCULAR:  Left groin healed Left above-knee popliteal incision healed Left femoral pulse palpable Left DP pulse palpable No right lower extremity tissue loss   DATA:   Left iliac stents with concern for >50% stenosis with velocity 302 mid left EIA  Left leg bypass is patent with increased velocity 254 but no disease appreciated  ABIs noncompressible, triphasic waveforms at  left ankle  Assessment/Plan:  77 y.o. male with multiple medical comorbidities who presents for 6 month follow-up and surveillance of his PAD.  He underwent a left common femoral endarterectomy with a left common femoral to above-knee popliteal bypass with PTFE including left iliac artery stenting on 12/09/2021 for CLI with tissue loss.  He  ultimately required washout of a hematoma on 12/17/2021.  He then had a breakdown of his left groin incision requiring I&D with myriad and a VAC on 01/08/2022.  His graft was never exposed although he did grow Staph epidermidis that we treated with 6 weeks of antibiotics.    Today he is doing well.  There is concern for a greater than 50% stenosis in the left iliac stent.  That being said he has an excellent left common femoral pulse and a left pedal pulse in the dorsalis pedis.  He has no lower extremity claudication symptoms.  He has triphasic waveforms at the ankle.  I do not see an indication for intervention at this time and we will continue close interval surveillance.  Repeat aortoiliac duplex and left leg bypass duplex in 6 months.  We also look at the right iliac stent placed by Dr. Allyson Sabal in 2017.  His left leg bypass again is patent on duplex today with no disease appreciated.  Discussed he call with questions or concerns.   Cephus Shelling, MD [facility] Office: [phone]

## 2022-12-26 ENCOUNTER — Ambulatory Visit (INDEPENDENT_AMBULATORY_CARE_PROVIDER_SITE_OTHER)
Admission: RE | Admit: 2022-12-26 | Discharge: 2022-12-26 | Disposition: A | Discharge status: Home / Self Care | Payer: Medicare PPO | Source: Ambulatory Visit | Attending: Vascular Surgery | Admitting: Vascular Surgery

## 2022-12-26 ENCOUNTER — Ambulatory Visit (HOSPITAL_COMMUNITY)
Admission: RE | Admit: 2022-12-26 | Discharge: 2022-12-26 | Disposition: A | Discharge status: Home / Self Care | Payer: Medicare PPO | Source: Ambulatory Visit | Attending: Vascular Surgery | Admitting: Vascular Surgery

## 2022-12-26 ENCOUNTER — Encounter: Payer: Self-pay | Admitting: Vascular Surgery

## 2022-12-26 ENCOUNTER — Ambulatory Visit: Payer: Medicare PPO | Admitting: Vascular Surgery

## 2022-12-26 VITALS — BP 133/75 | HR 77 | Temp 97.6°F | Wt 200.0 lb

## 2022-12-26 DIAGNOSIS — I70222 Atherosclerosis of native arteries of extremities with rest pain, left leg: Secondary | ICD-10-CM | POA: Diagnosis not present

## 2022-12-26 LAB — VAS US ABI WITH/WO TBI

## 2022-12-31 ENCOUNTER — Other Ambulatory Visit: Payer: Self-pay | Admitting: Internal Medicine

## 2022-12-31 ENCOUNTER — Other Ambulatory Visit: Payer: Self-pay | Admitting: Cardiology

## 2022-12-31 NOTE — Progress Notes (Signed)
Result Date: 12/26/2022 LOWER EXTREMITY ARTERIAL DUPLEX STUDY Patient Name:  Aaron Sexton  Date of Exam:   12/26/2022 Medical Rec #: [id number]        Accession #:    [id number] Date of Birth: [birth year]       Patient Gender: M Patient Age:   77 years Exam Location:  [facility] Procedure:       VAS [place] LOWER EXTREMITY BYPASS GRAFT DUPLEX Referring Phys: Sherald Hess --------------------------------------------------------------------------------  Indications: Peripheral artery disease. High Risk Factors: Hyperlipidemia.  Vascular Interventions: Date: December 09, 2021                          Preoperative diagnosis: Critical limb ischemia of the                         left lower extremity with tissue loss                          Postoperative diagnosis: Same                          Procedure:                         1. Left common femoral endarterectomy including                         endarterectomy of the distal external iliac artery and                         profunda with bovine pericardial patch angioplasty                         2. Left common femoral to above-knee popliteal bypass                         with 6 mm ringed PTFE                         3. Left iliac arteriogram                         4. Angioplasty and stent of the left common iliac artery                         (7 mm x 39 mm VBX)                         5. Angioplasty and stent of the left external iliac                         artery (7 mm x 100 mm drug-coated Eluvia post-dilated                         with 6 mm Mustang)                         6. Left lower extremity arteriogram.  Current ABI:            R=Marmet, L= Performing  Result Date: 12/26/2022 LOWER EXTREMITY ARTERIAL DUPLEX STUDY Patient Name:  Aaron Sexton  Date of Exam:   12/26/2022 Medical Rec #: [id number]        Accession #:    [id number] Date of Birth: [birth year]       Patient Gender: M Patient Age:   77 years Exam Location:  [facility] Procedure:       VAS [place] LOWER EXTREMITY BYPASS GRAFT DUPLEX Referring Phys: Sherald Hess --------------------------------------------------------------------------------  Indications: Peripheral artery disease. High Risk Factors: Hyperlipidemia.  Vascular Interventions: Date: December 09, 2021                          Preoperative diagnosis: Critical limb ischemia of the                         left lower extremity with tissue loss                          Postoperative diagnosis: Same                          Procedure:                         1. Left common femoral endarterectomy including                         endarterectomy of the distal external iliac artery and                         profunda with bovine pericardial patch angioplasty                         2. Left common femoral to above-knee popliteal bypass                         with 6 mm ringed PTFE                         3. Left iliac arteriogram                         4. Angioplasty and stent of the left common iliac artery                         (7 mm x 39 mm VBX)                         5. Angioplasty and stent of the left external iliac                         artery (7 mm x 100 mm drug-coated Eluvia post-dilated                         with 6 mm Mustang)                         6. Left lower extremity arteriogram.  Current ABI:            R=Marmet, L= Performing  Date: 12/26/2022 ABDOMINAL AORTA STUDY Patient Name:  Aaron Sexton  Date of Exam:   12/26/2022 Medical Rec #: [id number]        Accession #:    [id number] Date of Birth: 09-23-1945       Patient Gender: M Patient Age:   69 years Exam  Location:  [facility] Procedure:      VAS US AORTA/IVC/ILIACS Referring Phys: Sherald Hess --------------------------------------------------------------------------------  Indications: Iliac stent evaluation Risk Factors: Hypertension, hyperlipidemia. Vascular Interventions: Date: December 09, 2021                          Preoperative diagnosis: Critical limb ischemia of the                         left lower extremity with tissue loss                          Postoperative diagnosis: Same                          Procedure:                         1. Left common femoral endarterectomy including                         endarterectomy of the distal external iliac artery and                         profunda with bovine pericardial patch angioplasty                         2. Left common femoral to above-knee popliteal bypass                         with 6 mm ringed PTFE                         3. Left iliac arteriogram                         4. Angioplasty and stent of the left common iliac artery                         (7 mm x 39 mm VBX)                         5. Angioplasty and stent of the left external iliac                         artery (7 mm x 100 mm drug-coated Eluvia post-dilated                         with 6 mm Mustang)                         6. Left lower extremity arteriogram.   Performing Technologist: Dorthula Matas RVS, RCS  Examination Guidelines: A complete evaluation includes B-mode imaging, spectral Doppler, color Doppler, and power Doppler as  needed of all accessible portions of each vessel. Bilateral testing is considered an integral part of a complete examination. Limited examinations for reoccurring indications may be performed as noted.  Left Stent(s): +---------------+--------+--------+--------+--------+ EIA            PSV cm/sStenosisWaveformComments +---------------+--------+--------+--------+--------+ Prox to Stent  297             biphasic          +---------------+--------+--------+--------+--------+ Proximal Stent 252             biphasic         +---------------+--------+--------+--------+--------+ Mid Stent      302             biphasic         +---------------+--------+--------+--------+--------+ Distal Stent   262             biphasic         +---------------+--------+--------+--------+--------+ Distal to Stent225             biphasic         +---------------+--------+--------+--------+--------+  +--------------+--------+--------+--------+--------+ CIA           PSV cm/sStenosisWaveformComments +--------------+--------+--------+--------+--------+ Prox to Stent 158             biphasic         +--------------+--------+--------+--------+--------+ Proximal Stent231             biphasic         +--------------+--------+--------+--------+--------+ Mid Stent     255             biphasic         +--------------+--------+--------+--------+--------+ Distal Stent  297             biphasic         +--------------+--------+--------+--------+--------+   Summary: Stenosis: +-------------------+---------------+ Location           Stent           +-------------------+---------------+ 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  Electronically signed by Sherald Hess MD on 12/26/2022 at 11:25:22 AM.    Final    VAS [place] ABI WITH/WO TBI  Result Date: 12/26/2022  LOWER EXTREMITY DOPPLER STUDY Patient Name:  Aaron Sexton  Date of Exam:   12/26/2022 Medical Rec #: [id number]        Accession #:    [id number] Date of Birth: 02/06/1946       Patient Gender: M Patient Age:   13 years Exam Location:  [facility] Procedure:      VAS [place] ABI WITH/WO TBI Referring Phys:  --------------------------------------------------------------------------------  Indications: Peripheral artery disease. Left bypass evaluation. High Risk Factors: Hyperlipidemia.  Vascular Interventions: Date: December 09, 2021                          Preoperative diagnosis: Critical limb ischemia of the                         left lower extremity with tissue loss                          Postoperative diagnosis: Same                          Procedure:  Result Date: 12/26/2022 LOWER EXTREMITY ARTERIAL DUPLEX STUDY Patient Name:  Aaron Sexton  Date of Exam:   12/26/2022 Medical Rec #: [id number]        Accession #:    [id number] Date of Birth: [birth year]       Patient Gender: M Patient Age:   77 years Exam Location:  [facility] Procedure:       VAS [place] LOWER EXTREMITY BYPASS GRAFT DUPLEX Referring Phys: Sherald Hess --------------------------------------------------------------------------------  Indications: Peripheral artery disease. High Risk Factors: Hyperlipidemia.  Vascular Interventions: Date: December 09, 2021                          Preoperative diagnosis: Critical limb ischemia of the                         left lower extremity with tissue loss                          Postoperative diagnosis: Same                          Procedure:                         1. Left common femoral endarterectomy including                         endarterectomy of the distal external iliac artery and                         profunda with bovine pericardial patch angioplasty                         2. Left common femoral to above-knee popliteal bypass                         with 6 mm ringed PTFE                         3. Left iliac arteriogram                         4. Angioplasty and stent of the left common iliac artery                         (7 mm x 39 mm VBX)                         5. Angioplasty and stent of the left external iliac                         artery (7 mm x 100 mm drug-coated Eluvia post-dilated                         with 6 mm Mustang)                         6. Left lower extremity arteriogram.  Current ABI:            R=Marmet, L= Performing  Date: 12/26/2022 ABDOMINAL AORTA STUDY Patient Name:  Aaron Sexton  Date of Exam:   12/26/2022 Medical Rec #: [id number]        Accession #:    [id number] Date of Birth: 09-23-1945       Patient Gender: M Patient Age:   69 years Exam  Location:  [facility] Procedure:      VAS US AORTA/IVC/ILIACS Referring Phys: Sherald Hess --------------------------------------------------------------------------------  Indications: Iliac stent evaluation Risk Factors: Hypertension, hyperlipidemia. Vascular Interventions: Date: December 09, 2021                          Preoperative diagnosis: Critical limb ischemia of the                         left lower extremity with tissue loss                          Postoperative diagnosis: Same                          Procedure:                         1. Left common femoral endarterectomy including                         endarterectomy of the distal external iliac artery and                         profunda with bovine pericardial patch angioplasty                         2. Left common femoral to above-knee popliteal bypass                         with 6 mm ringed PTFE                         3. Left iliac arteriogram                         4. Angioplasty and stent of the left common iliac artery                         (7 mm x 39 mm VBX)                         5. Angioplasty and stent of the left external iliac                         artery (7 mm x 100 mm drug-coated Eluvia post-dilated                         with 6 mm Mustang)                         6. Left lower extremity arteriogram.   Performing Technologist: Dorthula Matas RVS, RCS  Examination Guidelines: A complete evaluation includes B-mode imaging, spectral Doppler, color Doppler, and power Doppler as  needed of all accessible portions of each vessel. Bilateral testing is considered an integral part of a complete examination. Limited examinations for reoccurring indications may be performed as noted.  Left Stent(s): +---------------+--------+--------+--------+--------+ EIA            PSV cm/sStenosisWaveformComments +---------------+--------+--------+--------+--------+ Prox to Stent  297             biphasic          +---------------+--------+--------+--------+--------+ Proximal Stent 252             biphasic         +---------------+--------+--------+--------+--------+ Mid Stent      302             biphasic         +---------------+--------+--------+--------+--------+ Distal Stent   262             biphasic         +---------------+--------+--------+--------+--------+ Distal to Stent225             biphasic         +---------------+--------+--------+--------+--------+  +--------------+--------+--------+--------+--------+ CIA           PSV cm/sStenosisWaveformComments +--------------+--------+--------+--------+--------+ Prox to Stent 158             biphasic         +--------------+--------+--------+--------+--------+ Proximal Stent231             biphasic         +--------------+--------+--------+--------+--------+ Mid Stent     255             biphasic         +--------------+--------+--------+--------+--------+ Distal Stent  297             biphasic         +--------------+--------+--------+--------+--------+   Summary: Stenosis: +-------------------+---------------+ Location           Stent           +-------------------+---------------+ 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  Electronically signed by Sherald Hess MD on 12/26/2022 at 11:25:22 AM.    Final    VAS [place] ABI WITH/WO TBI  Result Date: 12/26/2022  LOWER EXTREMITY DOPPLER STUDY Patient Name:  Aaron Sexton  Date of Exam:   12/26/2022 Medical Rec #: [id number]        Accession #:    [id number] Date of Birth: 02/06/1946       Patient Gender: M Patient Age:   13 years Exam Location:  [facility] Procedure:      VAS [place] ABI WITH/WO TBI Referring Phys:  --------------------------------------------------------------------------------  Indications: Peripheral artery disease. Left bypass evaluation. High Risk Factors: Hyperlipidemia.  Vascular Interventions: Date: December 09, 2021                          Preoperative diagnosis: Critical limb ischemia of the                         left lower extremity with tissue loss                          Postoperative diagnosis: Same                          Procedure:  Result Date: 12/26/2022 LOWER EXTREMITY ARTERIAL DUPLEX STUDY Patient Name:  Aaron Sexton  Date of Exam:   12/26/2022 Medical Rec #: [id number]        Accession #:    [id number] Date of Birth: [birth year]       Patient Gender: M Patient Age:   77 years Exam Location:  [facility] Procedure:       VAS [place] LOWER EXTREMITY BYPASS GRAFT DUPLEX Referring Phys: Sherald Hess --------------------------------------------------------------------------------  Indications: Peripheral artery disease. High Risk Factors: Hyperlipidemia.  Vascular Interventions: Date: December 09, 2021                          Preoperative diagnosis: Critical limb ischemia of the                         left lower extremity with tissue loss                          Postoperative diagnosis: Same                          Procedure:                         1. Left common femoral endarterectomy including                         endarterectomy of the distal external iliac artery and                         profunda with bovine pericardial patch angioplasty                         2. Left common femoral to above-knee popliteal bypass                         with 6 mm ringed PTFE                         3. Left iliac arteriogram                         4. Angioplasty and stent of the left common iliac artery                         (7 mm x 39 mm VBX)                         5. Angioplasty and stent of the left external iliac                         artery (7 mm x 100 mm drug-coated Eluvia post-dilated                         with 6 mm Mustang)                         6. Left lower extremity arteriogram.  Current ABI:            R=Marmet, L= Performing  Result Date: 12/26/2022 LOWER EXTREMITY ARTERIAL DUPLEX STUDY Patient Name:  Aaron Sexton  Date of Exam:   12/26/2022 Medical Rec #: [id number]        Accession #:    [id number] Date of Birth: [birth year]       Patient Gender: M Patient Age:   77 years Exam Location:  [facility] Procedure:       VAS [place] LOWER EXTREMITY BYPASS GRAFT DUPLEX Referring Phys: Sherald Hess --------------------------------------------------------------------------------  Indications: Peripheral artery disease. High Risk Factors: Hyperlipidemia.  Vascular Interventions: Date: December 09, 2021                          Preoperative diagnosis: Critical limb ischemia of the                         left lower extremity with tissue loss                          Postoperative diagnosis: Same                          Procedure:                         1. Left common femoral endarterectomy including                         endarterectomy of the distal external iliac artery and                         profunda with bovine pericardial patch angioplasty                         2. Left common femoral to above-knee popliteal bypass                         with 6 mm ringed PTFE                         3. Left iliac arteriogram                         4. Angioplasty and stent of the left common iliac artery                         (7 mm x 39 mm VBX)                         5. Angioplasty and stent of the left external iliac                         artery (7 mm x 100 mm drug-coated Eluvia post-dilated                         with 6 mm Mustang)                         6. Left lower extremity arteriogram.  Current ABI:            R=Marmet, L= Performing  Result Date: 12/26/2022 LOWER EXTREMITY ARTERIAL DUPLEX STUDY Patient Name:  Aaron Sexton  Date of Exam:   12/26/2022 Medical Rec #: [id number]        Accession #:    [id number] Date of Birth: [birth year]       Patient Gender: M Patient Age:   77 years Exam Location:  [facility] Procedure:       VAS [place] LOWER EXTREMITY BYPASS GRAFT DUPLEX Referring Phys: Sherald Hess --------------------------------------------------------------------------------  Indications: Peripheral artery disease. High Risk Factors: Hyperlipidemia.  Vascular Interventions: Date: December 09, 2021                          Preoperative diagnosis: Critical limb ischemia of the                         left lower extremity with tissue loss                          Postoperative diagnosis: Same                          Procedure:                         1. Left common femoral endarterectomy including                         endarterectomy of the distal external iliac artery and                         profunda with bovine pericardial patch angioplasty                         2. Left common femoral to above-knee popliteal bypass                         with 6 mm ringed PTFE                         3. Left iliac arteriogram                         4. Angioplasty and stent of the left common iliac artery                         (7 mm x 39 mm VBX)                         5. Angioplasty and stent of the left external iliac                         artery (7 mm x 100 mm drug-coated Eluvia post-dilated                         with 6 mm Mustang)                         6. Left lower extremity arteriogram.  Current ABI:            R=Marmet, L= Performing  needed of all accessible portions of each vessel. Bilateral testing is considered an integral part of a complete examination. Limited examinations for reoccurring indications may be performed as noted.  Left Stent(s): +---------------+--------+--------+--------+--------+ EIA            PSV cm/sStenosisWaveformComments +---------------+--------+--------+--------+--------+ Prox to Stent  297             biphasic          +---------------+--------+--------+--------+--------+ Proximal Stent 252             biphasic         +---------------+--------+--------+--------+--------+ Mid Stent      302             biphasic         +---------------+--------+--------+--------+--------+ Distal Stent   262             biphasic         +---------------+--------+--------+--------+--------+ Distal to Stent225             biphasic         +---------------+--------+--------+--------+--------+  +--------------+--------+--------+--------+--------+ CIA           PSV cm/sStenosisWaveformComments +--------------+--------+--------+--------+--------+ Prox to Stent 158             biphasic         +--------------+--------+--------+--------+--------+ Proximal Stent231             biphasic         +--------------+--------+--------+--------+--------+ Mid Stent     255             biphasic         +--------------+--------+--------+--------+--------+ Distal Stent  297             biphasic         +--------------+--------+--------+--------+--------+   Summary: Stenosis: +-------------------+---------------+ Location           Stent           +-------------------+---------------+ 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  Electronically signed by Sherald Hess MD on 12/26/2022 at 11:25:22 AM.    Final    VAS [place] ABI WITH/WO TBI  Result Date: 12/26/2022  LOWER EXTREMITY DOPPLER STUDY Patient Name:  Aaron Sexton  Date of Exam:   12/26/2022 Medical Rec #: [id number]        Accession #:    [id number] Date of Birth: 02/06/1946       Patient Gender: M Patient Age:   13 years Exam Location:  [facility] Procedure:      VAS [place] ABI WITH/WO TBI Referring Phys:  --------------------------------------------------------------------------------  Indications: Peripheral artery disease. Left bypass evaluation. High Risk Factors: Hyperlipidemia.  Vascular Interventions: Date: December 09, 2021                          Preoperative diagnosis: Critical limb ischemia of the                         left lower extremity with tissue loss                          Postoperative diagnosis: Same                          Procedure:  Result Date: 12/26/2022 LOWER EXTREMITY ARTERIAL DUPLEX STUDY Patient Name:  Aaron Sexton  Date of Exam:   12/26/2022 Medical Rec #: [id number]        Accession #:    [id number] Date of Birth: [birth year]       Patient Gender: M Patient Age:   77 years Exam Location:  [facility] Procedure:       VAS [place] LOWER EXTREMITY BYPASS GRAFT DUPLEX Referring Phys: Sherald Hess --------------------------------------------------------------------------------  Indications: Peripheral artery disease. High Risk Factors: Hyperlipidemia.  Vascular Interventions: Date: December 09, 2021                          Preoperative diagnosis: Critical limb ischemia of the                         left lower extremity with tissue loss                          Postoperative diagnosis: Same                          Procedure:                         1. Left common femoral endarterectomy including                         endarterectomy of the distal external iliac artery and                         profunda with bovine pericardial patch angioplasty                         2. Left common femoral to above-knee popliteal bypass                         with 6 mm ringed PTFE                         3. Left iliac arteriogram                         4. Angioplasty and stent of the left common iliac artery                         (7 mm x 39 mm VBX)                         5. Angioplasty and stent of the left external iliac                         artery (7 mm x 100 mm drug-coated Eluvia post-dilated                         with 6 mm Mustang)                         6. Left lower extremity arteriogram.  Current ABI:            R=Marmet, L= Performing

## 2023-01-01 ENCOUNTER — Inpatient Hospital Stay: Payer: Medicare PPO

## 2023-01-01 ENCOUNTER — Other Ambulatory Visit (HOSPITAL_COMMUNITY): Payer: Self-pay

## 2023-01-01 ENCOUNTER — Telehealth: Payer: Self-pay | Admitting: Pharmacist

## 2023-01-01 ENCOUNTER — Inpatient Hospital Stay: Payer: Medicare PPO | Admitting: Hematology

## 2023-01-01 VITALS — BP 132/56 | HR 83 | Temp 98.3°F | Resp 19 | Wt 204.3 lb

## 2023-01-01 DIAGNOSIS — M2550 Pain in unspecified joint: Secondary | ICD-10-CM | POA: Diagnosis not present

## 2023-01-01 DIAGNOSIS — D469 Myelodysplastic syndrome, unspecified: Secondary | ICD-10-CM

## 2023-01-01 DIAGNOSIS — I2510 Atherosclerotic heart disease of native coronary artery without angina pectoris: Secondary | ICD-10-CM | POA: Diagnosis not present

## 2023-01-01 DIAGNOSIS — D539 Nutritional anemia, unspecified: Secondary | ICD-10-CM | POA: Diagnosis not present

## 2023-01-01 DIAGNOSIS — E785 Hyperlipidemia, unspecified: Secondary | ICD-10-CM | POA: Diagnosis not present

## 2023-01-01 DIAGNOSIS — K219 Gastro-esophageal reflux disease without esophagitis: Secondary | ICD-10-CM | POA: Diagnosis not present

## 2023-01-01 DIAGNOSIS — D123 Benign neoplasm of transverse colon: Secondary | ICD-10-CM | POA: Diagnosis not present

## 2023-01-01 DIAGNOSIS — K5730 Diverticulosis of large intestine without perforation or abscess without bleeding: Secondary | ICD-10-CM | POA: Diagnosis not present

## 2023-01-01 DIAGNOSIS — I6522 Occlusion and stenosis of left carotid artery: Secondary | ICD-10-CM | POA: Diagnosis not present

## 2023-01-01 LAB — CBC WITH DIFFERENTIAL/PLATELET
Abs Immature Granulocytes: 0.02 10*3/uL (ref 0.00–0.07)
Basophils Absolute: 0.1 10*3/uL (ref 0.0–0.1)
Basophils Relative: 1 %
Eosinophils Absolute: 0.2 10*3/uL (ref 0.0–0.5)
Eosinophils Relative: 3 %
HCT: 25.2 % — ABNORMAL LOW (ref 39.0–52.0)
Hemoglobin: 8.3 g/dL — ABNORMAL LOW (ref 13.0–17.0)
Immature Granulocytes: 0 %
Lymphocytes Relative: 27 %
Lymphs Abs: 1.9 10*3/uL (ref 0.7–4.0)
MCH: 36.1 pg — ABNORMAL HIGH (ref 26.0–34.0)
MCHC: 32.9 g/dL (ref 30.0–36.0)
MCV: 109.6 fL — ABNORMAL HIGH (ref 80.0–100.0)
Monocytes Absolute: 0.8 10*3/uL (ref 0.1–1.0)
Monocytes Relative: 10 %
Neutro Abs: 4.3 10*3/uL (ref 1.7–7.7)
Neutrophils Relative %: 59 %
Platelets: 306 10*3/uL (ref 150–400)
RBC: 2.3 MIL/uL — ABNORMAL LOW (ref 4.22–5.81)
RDW: 21.1 % — ABNORMAL HIGH (ref 11.5–15.5)
WBC: 7.2 10*3/uL (ref 4.0–10.5)
nRBC: 0.6 % — ABNORMAL HIGH (ref 0.0–0.2)

## 2023-01-01 LAB — COMPREHENSIVE METABOLIC PANEL
ALT: 11 U/L (ref 0–44)
AST: 16 U/L (ref 15–41)
Albumin: 3.7 g/dL (ref 3.5–5.0)
Alkaline Phosphatase: 44 U/L (ref 38–126)
Anion gap: 8 (ref 5–15)
BUN: 23 mg/dL (ref 8–23)
CO2: 23 mmol/L (ref 22–32)
Calcium: 8.6 mg/dL — ABNORMAL LOW (ref 8.9–10.3)
Chloride: 103 mmol/L (ref 98–111)
Creatinine, Ser: 1.15 mg/dL (ref 0.61–1.24)
GFR, Estimated: >60 mL/min (ref >60)
Glucose, Bld: 128 mg/dL — ABNORMAL HIGH (ref 70–99)
Potassium: 3.8 mmol/L (ref 3.5–5.1)
Sodium: 134 mmol/L — ABNORMAL LOW (ref 135–145)
Total Bilirubin: 0.5 mg/dL (ref 0.3–1.2)
Total Protein: 7.5 g/dL (ref 6.5–8.1)

## 2023-01-01 LAB — SAMPLE TO BLOOD BANK

## 2023-01-01 LAB — LACTATE DEHYDROGENASE: LDH: 128 U/L (ref 98–192)

## 2023-01-01 MED ORDER — LENALIDOMIDE 10 MG PO CAPS: 10.0000 mg | ORAL_CAPSULE | Freq: Every day | ORAL | 0 refills | Status: DC

## 2023-01-01 NOTE — Telephone Encounter (Signed)
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  CMP from 01/01/23 assessed. Patient's CrCl ~21ml/min, patient os borderline renal dose adjustment  Prescription dose and frequency assessed.   Current medication list in Epic reviewed, no DDIs with lenalidomide identified.  Evaluated chart and no patient barriers to medication adherence identified.   Prescription has been e-scribed to the [facility] for benefits analysis and approval.  Oral Oncology Clinic will continue to follow for insurance authorization, copayment issues, initial counseling and start date.   Remi Haggard, PharmD, BCPS, BCOP, CPP Hematology/Oncology Clinical Pharmacist Practitioner [place] [phone]  01/01/2023 3:06 PM

## 2023-01-01 NOTE — Patient Instructions (Signed)
[facility] at [facility] Discharge Instructions   You were seen and examined today by Dr. Ellin Saba.  He reviewed the results of your bone marrow biopsy. It is showing you have a condition called MDS (myelodysplastic syndrome). This is a condition where you bone marrow cannot make the blood cells properly.   Dr. Kirtland Bouchard recommends you start treatment with a pill called Revlimid. This pill comes from a specialty pharmacy and will be delivered to your home. Expect calls from the pharmacy to set up delivery.   We will check your blood work today to make sure you don't need a blood transfusion.   We will see you back in 3 weeks. We will repeat lab work at that time.   Return as scheduled.      Thank you for choosing [facility] at [facility] to provide your oncology and hematology care.  To afford each patient quality time with our provider, please arrive at least 15 minutes before your scheduled appointment time.   If you have a lab appointment with the Cancer Center please come in thru the Main Entrance and check in at the main information desk.  You need to re-schedule your appointment should you arrive 10 or more minutes late.  We strive to give you quality time with our providers, and arriving late affects you and other patients whose appointments are after yours.  Also, if you no show three or more times for appointments you may be dismissed from the clinic at the providers discretion.     Again, thank you for choosing [facility].  Our hope is that these requests will decrease the amount of time that you wait before being seen by our physicians.       _____________________________________________________________  Should you have questions after your visit to [facility], please contact our office at [phone] and follow the prompts.  Our office hours are 8:00 a.m. and 4:30 p.m. Monday - Friday.  Please note that  voicemails left after 4:00 p.m. may not be returned until the following business day.  We are closed weekends and major holidays.  You do have access to a nurse 24-7, just call the main number to the clinic [phone] and do not press any options, hold on the line and a nurse will answer the phone.    For prescription refill requests, have your pharmacy contact our office and allow 72 hours.    Due to Covid, you will need to wear a mask upon entering the hospital. If you do not have a mask, a mask will be given to you at the Main Entrance upon arrival. For doctor visits, patients may have 1 support person age 20 or older with them. For treatment visits, patients can not have anyone with them due to social distancing guidelines and our immunocompromised population.

## 2023-01-02 ENCOUNTER — Other Ambulatory Visit (HOSPITAL_COMMUNITY): Payer: Self-pay

## 2023-01-02 ENCOUNTER — Telehealth: Payer: Self-pay

## 2023-01-02 MED ORDER — LENALIDOMIDE 10 MG PO CAPS
10.0000 mg | ORAL_CAPSULE | Freq: Every day | ORAL | 0 refills | Status: DC
Start: 2023-01-02 — End: 2023-01-25

## 2023-01-02 NOTE — Telephone Encounter (Signed)
Oral Oncology Patient Advocate Encounter  Prior Authorization for Lenalidomide has been approved.    PA# [id number]  Effective dates: 05/08/22 through 05/08/23  Patients co-pay is $100.00.   Patient has [employer] Medicare Part D and must fill Lenalidomide through [employer].  Script and all supporting documents sent to [employer] for processing and fulfillment.  Medication scheduled for delivery from [facility] to patient's address on 01/05/23.    Ardeen Fillers, CPhT Oncology Pharmacy Patient Advocate  [facility]  [phone] (phone) [phone] (fax) 01/02/2023 8:43 AM

## 2023-01-02 NOTE — Telephone Encounter (Signed)
Oral Oncology Patient Advocate Encounter  Was successful in securing patient a $10,000.00 grant from [facility] to provide copayment coverage for Lenalidomide.  This will keep the out of pocket expense at $0.     [employer] ID: [id number]   The billing information is as follows and has been shared with [facility].    RxBin: [id number] PCN: [id number] Member ID: [id number] Group ID: [id number] Dates of Eligibility: 12/03/22 through 12/02/23  Fund:  Myelodysplastic Syndromes - Medicare Access   Ardeen Fillers, CPhT Oncology Pharmacy Patient Advocate  [facility]  [phone] (phone) [phone] (fax) 01/02/2023 8:51 AM

## 2023-01-02 NOTE — Telephone Encounter (Signed)
Oral Oncology Patient Advocate Encounter  New authorization   Received notification that prior authorization for Lenalidomide is required.   PA submitted on 01/02/23  Key [id number]  Status is pending     Ardeen Fillers, CPhT Oncology Pharmacy Patient Advocate  [facility]  [phone] (phone) [phone] (fax) 01/02/2023 8:20 AM

## 2023-01-05 NOTE — Telephone Encounter (Signed)
Patient reports receiving extensive education from the pharmacy when lenalidomide was dispensed. Answered any remaining questions from the patient. He will get started on his lenalidomide today 01/05/23.

## 2023-01-16 ENCOUNTER — Other Ambulatory Visit: Payer: Self-pay

## 2023-01-16 DIAGNOSIS — I70222 Atherosclerosis of native arteries of extremities with rest pain, left leg: Secondary | ICD-10-CM

## 2023-01-17 ENCOUNTER — Other Ambulatory Visit: Payer: Self-pay | Admitting: Internal Medicine

## 2023-01-17 NOTE — Telephone Encounter (Signed)
Requesting: traMADol (ULTRAM) 50 MG tablet  Contract: N/A UDS: N/A Last Visit: 03/01/22 Next Visit: 02/12/23 Last Refill: 10/30/22 (90,0)  RF request for losartan (COZAAR) 25 MG tablet  LOV: 03/01/22 Next ov: 02/12/23 Last written: 02/09/22 (90,3)   Please advise. Meds pending

## 2023-01-23 ENCOUNTER — Inpatient Hospital Stay: Payer: Medicare PPO

## 2023-01-23 ENCOUNTER — Inpatient Hospital Stay: Payer: Medicare PPO | Attending: Hematology | Admitting: Hematology

## 2023-01-23 VITALS — BP 129/45 | HR 88 | Temp 97.8°F | Resp 20

## 2023-01-23 VITALS — BP 110/53 | HR 79 | Temp 99.5°F | Resp 19

## 2023-01-23 DIAGNOSIS — K5730 Diverticulosis of large intestine without perforation or abscess without bleeding: Secondary | ICD-10-CM | POA: Diagnosis not present

## 2023-01-23 DIAGNOSIS — I2510 Atherosclerotic heart disease of native coronary artery without angina pectoris: Secondary | ICD-10-CM | POA: Diagnosis not present

## 2023-01-23 DIAGNOSIS — K648 Other hemorrhoids: Secondary | ICD-10-CM | POA: Insufficient documentation

## 2023-01-23 DIAGNOSIS — D469 Myelodysplastic syndrome, unspecified: Secondary | ICD-10-CM

## 2023-01-23 DIAGNOSIS — Z8719 Personal history of other diseases of the digestive system: Secondary | ICD-10-CM | POA: Insufficient documentation

## 2023-01-23 DIAGNOSIS — M1990 Unspecified osteoarthritis, unspecified site: Secondary | ICD-10-CM | POA: Insufficient documentation

## 2023-01-23 DIAGNOSIS — C9200 Acute myeloblastic leukemia, not having achieved remission: Secondary | ICD-10-CM | POA: Diagnosis not present

## 2023-01-23 DIAGNOSIS — R059 Cough, unspecified: Secondary | ICD-10-CM | POA: Diagnosis not present

## 2023-01-23 LAB — CBC WITH DIFFERENTIAL/PLATELET
Abs Immature Granulocytes: 0.08 10*3/uL — ABNORMAL HIGH (ref 0.00–0.07)
Basophils Absolute: 0 10*3/uL (ref 0.0–0.1)
Basophils Relative: 0 %
Eosinophils Absolute: 0 10*3/uL (ref 0.0–0.5)
Eosinophils Relative: 0 %
HCT: 22.5 % — ABNORMAL LOW (ref 39.0–52.0)
Hemoglobin: 7.4 g/dL — ABNORMAL LOW (ref 13.0–17.0)
Immature Granulocytes: 1 %
Lymphocytes Relative: 24 %
Lymphs Abs: 1.8 10*3/uL (ref 0.7–4.0)
MCH: 37.8 pg — ABNORMAL HIGH (ref 26.0–34.0)
MCHC: 32.9 g/dL (ref 30.0–36.0)
MCV: 114.8 fL — ABNORMAL HIGH (ref 80.0–100.0)
Monocytes Absolute: 0.7 10*3/uL (ref 0.1–1.0)
Monocytes Relative: 9 %
Neutro Abs: 5 10*3/uL (ref 1.7–7.7)
Neutrophils Relative %: 66 %
Platelets: 185 10*3/uL (ref 150–400)
RBC: 1.96 MIL/uL — ABNORMAL LOW (ref 4.22–5.81)
RDW: 20.1 % — ABNORMAL HIGH (ref 11.5–15.5)
WBC: 7.6 10*3/uL (ref 4.0–10.5)
nRBC: 0 % (ref 0.0–0.2)

## 2023-01-23 LAB — COMPREHENSIVE METABOLIC PANEL
ALT: 13 U/L (ref 0–44)
AST: 14 U/L — ABNORMAL LOW (ref 15–41)
Albumin: 3.3 g/dL — ABNORMAL LOW (ref 3.5–5.0)
Alkaline Phosphatase: 38 U/L (ref 38–126)
Anion gap: 11 (ref 5–15)
BUN: 23 mg/dL (ref 8–23)
CO2: 22 mmol/L (ref 22–32)
Calcium: 7.9 mg/dL — ABNORMAL LOW (ref 8.9–10.3)
Chloride: 95 mmol/L — ABNORMAL LOW (ref 98–111)
Creatinine, Ser: 1.67 mg/dL — ABNORMAL HIGH (ref 0.61–1.24)
GFR, Estimated: 42 mL/min — ABNORMAL LOW (ref >60)
Glucose, Bld: 128 mg/dL — ABNORMAL HIGH (ref 70–99)
Potassium: 3.3 mmol/L — ABNORMAL LOW (ref 3.5–5.1)
Sodium: 128 mmol/L — ABNORMAL LOW (ref 135–145)
Total Bilirubin: 0.7 mg/dL (ref 0.3–1.2)
Total Protein: 7.7 g/dL (ref 6.5–8.1)

## 2023-01-23 LAB — SAMPLE TO BLOOD BANK

## 2023-01-23 LAB — PREPARE RBC (CROSSMATCH)

## 2023-01-23 LAB — LACTATE DEHYDROGENASE: LDH: 135 U/L (ref 98–192)

## 2023-01-23 MED ORDER — SODIUM CHLORIDE 0.9 % IV SOLN: INTRAVENOUS | Status: DC

## 2023-01-23 NOTE — Patient Instructions (Signed)
[facility]  Discharge Instructions: Thank you for choosing [facility] to provide your oncology and hematology care.  If you have a lab appointment with the Cancer Center - please note that after April 8th, 2024, all labs will be drawn in the cancer center.  You do not have to check in or register with the main entrance as you have in the past but will complete your check-in in the cancer center.  Wear comfortable clothing and clothing appropriate for easy access to any Portacath or PICC line.   We strive to give you quality time with your provider. You may need to reschedule your appointment if you arrive late (15 or more minutes).  Arriving late affects you and other patients whose appointments are after yours.  Also, if you miss three or more appointments without notifying the office, you may be dismissed from the clinic at the provider's discretion.      For prescription refill requests, have your pharmacy contact our office and allow 72 hours for refills to be completed.    Today you received the following chemotherapy and/or immunotherapy agents 500 mls of normal saline.        To help prevent nausea and vomiting after your treatment, we encourage you to take your nausea medication as directed.  BELOW ARE SYMPTOMS THAT SHOULD BE REPORTED IMMEDIATELY: *FEVER GREATER THAN 100.4 F (38 C) OR HIGHER *CHILLS OR SWEATING *NAUSEA AND VOMITING THAT IS NOT CONTROLLED WITH YOUR NAUSEA MEDICATION *UNUSUAL SHORTNESS OF BREATH *UNUSUAL BRUISING OR BLEEDING *URINARY PROBLEMS (pain or burning when urinating, or frequent urination) *BOWEL PROBLEMS (unusual diarrhea, constipation, pain near the anus) TENDERNESS IN MOUTH AND THROAT WITH OR WITHOUT PRESENCE OF ULCERS (sore throat, sores in mouth, or a toothache) UNUSUAL RASH, SWELLING OR PAIN  UNUSUAL VAGINAL DISCHARGE OR ITCHING   Items with * indicate a potential emergency and should be followed up as soon as  possible or go to the Emergency Department if any problems should occur.  Please show the CHEMOTHERAPY ALERT CARD or IMMUNOTHERAPY ALERT CARD at check-in to the Emergency Department and triage nurse.  Should you have questions after your visit or need to cancel or reschedule your appointment, please contact [facility] AT [facility] [phone]  and follow the prompts.  Office hours are 8:00 a.m. to 4:30 p.m. Monday - Friday. Please note that voicemails left after 4:00 p.m. may not be returned until the following business day.  We are closed weekends and major holidays. You have access to a nurse at all times for urgent questions. Please call the main number to the clinic [phone] and follow the prompts.  For any non-urgent questions, you may also contact your provider using MyChart. We now offer e-Visits for anyone 22 and older to request care online for non-urgent symptoms. For details visit [email or web address].   Also download the MyChart app! Go to the app store, search "MyChart", open the app, select [facility], and log in with your MyChart username and password.

## 2023-01-23 NOTE — Progress Notes (Signed)
Patient presents today for follow up visit with Dr. Ellin Saba. Patient added on to today's schedule for 500 mls of normal saline over 1hr per Dr. Ellin Saba / [employer] fundraiser. Patient added on for 1 unit of blood 01-23-2026.

## 2023-01-23 NOTE — Progress Notes (Signed)
1 liter of normal saline given today per MD orders. Tolerated infusion without adverse affects. Vital signs stable. No complaints at this time. Discharged from clinic by wheel chair in stable condition. Alert and oriented x 3. F/U with [facility] as scheduled.

## 2023-01-23 NOTE — Progress Notes (Signed)
H)  12/11/2022   IRONPCTSAT 66 (H) 11/06/2022   IRONPCTSAT 49 (H) 10/04/2022   Lab Results  Component Value Date   LDH 135 01/23/2023   LDH 128 01/01/2023   LDH 131 12/11/2022     STUDIES:   VAS [place] LOWER EXTREMITY BYPASS GRAFT DUPLEX  Result Date: 12/26/2022 LOWER EXTREMITY ARTERIAL DUPLEX STUDY Patient Name:  Aaron Sexton  Date of Exam:   12/26/2022 Medical Rec #: [id number]        Accession #:    [id number] Date of Birth: March 28, 1946       Patient Gender: M Patient Age:   77 years Exam Location:  [facility] Procedure:      VAS [place] LOWER EXTREMITY BYPASS GRAFT DUPLEX Referring Phys: Sherald Hess --------------------------------------------------------------------------------  Indications: Peripheral artery disease. High Risk Factors: Hyperlipidemia.  Vascular Interventions: Date: December 09, 2021                          Preoperative diagnosis: Critical limb ischemia of the                         left lower extremity with tissue loss                          Postoperative diagnosis: Same                          Procedure:                         1. Left common femoral endarterectomy including                         endarterectomy of the distal external iliac artery and                         profunda with bovine pericardial patch angioplasty                         2. Left common femoral to above-knee popliteal bypass                         with 6 mm ringed PTFE                         3. Left iliac arteriogram                         4. Angioplasty and stent of the left common iliac artery                         (7 mm x 39 mm VBX)                         5. Angioplasty and stent of the left external iliac                         artery (7 mm x 100 mm drug-coated Eluvia post-dilated  6. Left lower extremity arteriogram.   Performing Technologist: Dorthula Matas RVS, RCS  Examination Guidelines: A  complete evaluation includes B-mode imaging, spectral Doppler, color Doppler, and power Doppler as needed of all accessible portions of each vessel. Bilateral testing is considered an integral part of a complete examination. Limited examinations for reoccurring indications may be performed as noted.  Left Stent(s): +---------------+--------+--------+--------+--------+ EIA            PSV cm/sStenosisWaveformComments +---------------+--------+--------+--------+--------+ Prox to Stent  297             biphasic         +---------------+--------+--------+--------+--------+ Proximal Stent 252             biphasic         +---------------+--------+--------+--------+--------+ Mid Stent      302             biphasic         +---------------+--------+--------+--------+--------+ Distal Stent   262             biphasic         +---------------+--------+--------+--------+--------+ Distal to Stent225             biphasic         +---------------+--------+--------+--------+--------+  +--------------+--------+--------+--------+--------+ CIA           PSV cm/sStenosisWaveformComments +--------------+--------+--------+--------+--------+ Prox to Stent 158             biphasic         +--------------+--------+--------+--------+--------+ Proximal Stent231             biphasic         +--------------+--------+--------+--------+--------+ Mid Stent     255             biphasic         +--------------+--------+--------+--------+--------+ Distal Stent  297             biphasic         +--------------+--------+--------+--------+--------+   Summary: Stenosis: +-------------------+---------------+ Location           Stent           +-------------------+---------------+ 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   Electronically signed by Sherald Hess MD on 12/26/2022 at 11:25:22 AM.    Final    VAS [place] ABI WITH/WO TBI  Result Date: 12/26/2022  LOWER EXTREMITY DOPPLER STUDY Patient Name:  Aaron Sexton  Date of Exam:   12/26/2022 Medical Rec #: [id number]        Accession #:    [id number] Date of Birth: [birth year]       Patient Gender: M Patient Age:   67 years Exam Location:  [facility] Procedure:      VAS [place] ABI WITH/WO TBI Referring Phys: --------------------------------------------------------------------------------  Indications: Peripheral artery disease. Left bypass evaluation. High Risk Factors: Hyperlipidemia.  Vascular Interventions: Date: December 09, 2021                          Preoperative diagnosis: Critical limb ischemia of the                         left lower extremity with tissue loss                          Postoperative diagnosis: Same  H)  12/11/2022   IRONPCTSAT 66 (H) 11/06/2022   IRONPCTSAT 49 (H) 10/04/2022   Lab Results  Component Value Date   LDH 135 01/23/2023   LDH 128 01/01/2023   LDH 131 12/11/2022     STUDIES:   VAS [place] LOWER EXTREMITY BYPASS GRAFT DUPLEX  Result Date: 12/26/2022 LOWER EXTREMITY ARTERIAL DUPLEX STUDY Patient Name:  Aaron Sexton  Date of Exam:   12/26/2022 Medical Rec #: [id number]        Accession #:    [id number] Date of Birth: March 28, 1946       Patient Gender: M Patient Age:   77 years Exam Location:  [facility] Procedure:      VAS [place] LOWER EXTREMITY BYPASS GRAFT DUPLEX Referring Phys: Sherald Hess --------------------------------------------------------------------------------  Indications: Peripheral artery disease. High Risk Factors: Hyperlipidemia.  Vascular Interventions: Date: December 09, 2021                          Preoperative diagnosis: Critical limb ischemia of the                         left lower extremity with tissue loss                          Postoperative diagnosis: Same                          Procedure:                         1. Left common femoral endarterectomy including                         endarterectomy of the distal external iliac artery and                         profunda with bovine pericardial patch angioplasty                         2. Left common femoral to above-knee popliteal bypass                         with 6 mm ringed PTFE                         3. Left iliac arteriogram                         4. Angioplasty and stent of the left common iliac artery                         (7 mm x 39 mm VBX)                         5. Angioplasty and stent of the left external iliac                         artery (7 mm x 100 mm drug-coated Eluvia post-dilated  6. Left lower extremity arteriogram.   Performing Technologist: Dorthula Matas RVS, RCS  Examination Guidelines: A  complete evaluation includes B-mode imaging, spectral Doppler, color Doppler, and power Doppler as needed of all accessible portions of each vessel. Bilateral testing is considered an integral part of a complete examination. Limited examinations for reoccurring indications may be performed as noted.  Left Stent(s): +---------------+--------+--------+--------+--------+ EIA            PSV cm/sStenosisWaveformComments +---------------+--------+--------+--------+--------+ Prox to Stent  297             biphasic         +---------------+--------+--------+--------+--------+ Proximal Stent 252             biphasic         +---------------+--------+--------+--------+--------+ Mid Stent      302             biphasic         +---------------+--------+--------+--------+--------+ Distal Stent   262             biphasic         +---------------+--------+--------+--------+--------+ Distal to Stent225             biphasic         +---------------+--------+--------+--------+--------+  +--------------+--------+--------+--------+--------+ CIA           PSV cm/sStenosisWaveformComments +--------------+--------+--------+--------+--------+ Prox to Stent 158             biphasic         +--------------+--------+--------+--------+--------+ Proximal Stent231             biphasic         +--------------+--------+--------+--------+--------+ Mid Stent     255             biphasic         +--------------+--------+--------+--------+--------+ Distal Stent  297             biphasic         +--------------+--------+--------+--------+--------+   Summary: Stenosis: +-------------------+---------------+ Location           Stent           +-------------------+---------------+ 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   Electronically signed by Sherald Hess MD on 12/26/2022 at 11:25:22 AM.    Final    VAS [place] ABI WITH/WO TBI  Result Date: 12/26/2022  LOWER EXTREMITY DOPPLER STUDY Patient Name:  Aaron Sexton  Date of Exam:   12/26/2022 Medical Rec #: [id number]        Accession #:    [id number] Date of Birth: [birth year]       Patient Gender: M Patient Age:   67 years Exam Location:  [facility] Procedure:      VAS [place] ABI WITH/WO TBI Referring Phys: --------------------------------------------------------------------------------  Indications: Peripheral artery disease. Left bypass evaluation. High Risk Factors: Hyperlipidemia.  Vascular Interventions: Date: December 09, 2021                          Preoperative diagnosis: Critical limb ischemia of the                         left lower extremity with tissue loss                          Postoperative diagnosis: Same  H)  12/11/2022   IRONPCTSAT 66 (H) 11/06/2022   IRONPCTSAT 49 (H) 10/04/2022   Lab Results  Component Value Date   LDH 135 01/23/2023   LDH 128 01/01/2023   LDH 131 12/11/2022     STUDIES:   VAS [place] LOWER EXTREMITY BYPASS GRAFT DUPLEX  Result Date: 12/26/2022 LOWER EXTREMITY ARTERIAL DUPLEX STUDY Patient Name:  Aaron Sexton  Date of Exam:   12/26/2022 Medical Rec #: [id number]        Accession #:    [id number] Date of Birth: March 28, 1946       Patient Gender: M Patient Age:   77 years Exam Location:  [facility] Procedure:      VAS [place] LOWER EXTREMITY BYPASS GRAFT DUPLEX Referring Phys: Sherald Hess --------------------------------------------------------------------------------  Indications: Peripheral artery disease. High Risk Factors: Hyperlipidemia.  Vascular Interventions: Date: December 09, 2021                          Preoperative diagnosis: Critical limb ischemia of the                         left lower extremity with tissue loss                          Postoperative diagnosis: Same                          Procedure:                         1. Left common femoral endarterectomy including                         endarterectomy of the distal external iliac artery and                         profunda with bovine pericardial patch angioplasty                         2. Left common femoral to above-knee popliteal bypass                         with 6 mm ringed PTFE                         3. Left iliac arteriogram                         4. Angioplasty and stent of the left common iliac artery                         (7 mm x 39 mm VBX)                         5. Angioplasty and stent of the left external iliac                         artery (7 mm x 100 mm drug-coated Eluvia post-dilated  H)  12/11/2022   IRONPCTSAT 66 (H) 11/06/2022   IRONPCTSAT 49 (H) 10/04/2022   Lab Results  Component Value Date   LDH 135 01/23/2023   LDH 128 01/01/2023   LDH 131 12/11/2022     STUDIES:   VAS [place] LOWER EXTREMITY BYPASS GRAFT DUPLEX  Result Date: 12/26/2022 LOWER EXTREMITY ARTERIAL DUPLEX STUDY Patient Name:  Aaron Sexton  Date of Exam:   12/26/2022 Medical Rec #: [id number]        Accession #:    [id number] Date of Birth: March 28, 1946       Patient Gender: M Patient Age:   77 years Exam Location:  [facility] Procedure:      VAS [place] LOWER EXTREMITY BYPASS GRAFT DUPLEX Referring Phys: Sherald Hess --------------------------------------------------------------------------------  Indications: Peripheral artery disease. High Risk Factors: Hyperlipidemia.  Vascular Interventions: Date: December 09, 2021                          Preoperative diagnosis: Critical limb ischemia of the                         left lower extremity with tissue loss                          Postoperative diagnosis: Same                          Procedure:                         1. Left common femoral endarterectomy including                         endarterectomy of the distal external iliac artery and                         profunda with bovine pericardial patch angioplasty                         2. Left common femoral to above-knee popliteal bypass                         with 6 mm ringed PTFE                         3. Left iliac arteriogram                         4. Angioplasty and stent of the left common iliac artery                         (7 mm x 39 mm VBX)                         5. Angioplasty and stent of the left external iliac                         artery (7 mm x 100 mm drug-coated Eluvia post-dilated  H)  12/11/2022   IRONPCTSAT 66 (H) 11/06/2022   IRONPCTSAT 49 (H) 10/04/2022   Lab Results  Component Value Date   LDH 135 01/23/2023   LDH 128 01/01/2023   LDH 131 12/11/2022     STUDIES:   VAS [place] LOWER EXTREMITY BYPASS GRAFT DUPLEX  Result Date: 12/26/2022 LOWER EXTREMITY ARTERIAL DUPLEX STUDY Patient Name:  Aaron Sexton  Date of Exam:   12/26/2022 Medical Rec #: [id number]        Accession #:    [id number] Date of Birth: March 28, 1946       Patient Gender: M Patient Age:   77 years Exam Location:  [facility] Procedure:      VAS [place] LOWER EXTREMITY BYPASS GRAFT DUPLEX Referring Phys: Sherald Hess --------------------------------------------------------------------------------  Indications: Peripheral artery disease. High Risk Factors: Hyperlipidemia.  Vascular Interventions: Date: December 09, 2021                          Preoperative diagnosis: Critical limb ischemia of the                         left lower extremity with tissue loss                          Postoperative diagnosis: Same                          Procedure:                         1. Left common femoral endarterectomy including                         endarterectomy of the distal external iliac artery and                         profunda with bovine pericardial patch angioplasty                         2. Left common femoral to above-knee popliteal bypass                         with 6 mm ringed PTFE                         3. Left iliac arteriogram                         4. Angioplasty and stent of the left common iliac artery                         (7 mm x 39 mm VBX)                         5. Angioplasty and stent of the left external iliac                         artery (7 mm x 100 mm drug-coated Eluvia post-dilated  H)  12/11/2022   IRONPCTSAT 66 (H) 11/06/2022   IRONPCTSAT 49 (H) 10/04/2022   Lab Results  Component Value Date   LDH 135 01/23/2023   LDH 128 01/01/2023   LDH 131 12/11/2022     STUDIES:   VAS [place] LOWER EXTREMITY BYPASS GRAFT DUPLEX  Result Date: 12/26/2022 LOWER EXTREMITY ARTERIAL DUPLEX STUDY Patient Name:  Aaron Sexton  Date of Exam:   12/26/2022 Medical Rec #: [id number]        Accession #:    [id number] Date of Birth: March 28, 1946       Patient Gender: M Patient Age:   77 years Exam Location:  [facility] Procedure:      VAS [place] LOWER EXTREMITY BYPASS GRAFT DUPLEX Referring Phys: Sherald Hess --------------------------------------------------------------------------------  Indications: Peripheral artery disease. High Risk Factors: Hyperlipidemia.  Vascular Interventions: Date: December 09, 2021                          Preoperative diagnosis: Critical limb ischemia of the                         left lower extremity with tissue loss                          Postoperative diagnosis: Same                          Procedure:                         1. Left common femoral endarterectomy including                         endarterectomy of the distal external iliac artery and                         profunda with bovine pericardial patch angioplasty                         2. Left common femoral to above-knee popliteal bypass                         with 6 mm ringed PTFE                         3. Left iliac arteriogram                         4. Angioplasty and stent of the left common iliac artery                         (7 mm x 39 mm VBX)                         5. Angioplasty and stent of the left external iliac                         artery (7 mm x 100 mm drug-coated Eluvia post-dilated  6. Left lower extremity arteriogram.   Performing Technologist: Dorthula Matas RVS, RCS  Examination Guidelines: A  complete evaluation includes B-mode imaging, spectral Doppler, color Doppler, and power Doppler as needed of all accessible portions of each vessel. Bilateral testing is considered an integral part of a complete examination. Limited examinations for reoccurring indications may be performed as noted.  Left Stent(s): +---------------+--------+--------+--------+--------+ EIA            PSV cm/sStenosisWaveformComments +---------------+--------+--------+--------+--------+ Prox to Stent  297             biphasic         +---------------+--------+--------+--------+--------+ Proximal Stent 252             biphasic         +---------------+--------+--------+--------+--------+ Mid Stent      302             biphasic         +---------------+--------+--------+--------+--------+ Distal Stent   262             biphasic         +---------------+--------+--------+--------+--------+ Distal to Stent225             biphasic         +---------------+--------+--------+--------+--------+  +--------------+--------+--------+--------+--------+ CIA           PSV cm/sStenosisWaveformComments +--------------+--------+--------+--------+--------+ Prox to Stent 158             biphasic         +--------------+--------+--------+--------+--------+ Proximal Stent231             biphasic         +--------------+--------+--------+--------+--------+ Mid Stent     255             biphasic         +--------------+--------+--------+--------+--------+ Distal Stent  297             biphasic         +--------------+--------+--------+--------+--------+   Summary: Stenosis: +-------------------+---------------+ Location           Stent           +-------------------+---------------+ 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   Electronically signed by Sherald Hess MD on 12/26/2022 at 11:25:22 AM.    Final    VAS [place] ABI WITH/WO TBI  Result Date: 12/26/2022  LOWER EXTREMITY DOPPLER STUDY Patient Name:  Aaron Sexton  Date of Exam:   12/26/2022 Medical Rec #: [id number]        Accession #:    [id number] Date of Birth: [birth year]       Patient Gender: M Patient Age:   67 years Exam Location:  [facility] Procedure:      VAS [place] ABI WITH/WO TBI Referring Phys: --------------------------------------------------------------------------------  Indications: Peripheral artery disease. Left bypass evaluation. High Risk Factors: Hyperlipidemia.  Vascular Interventions: Date: December 09, 2021                          Preoperative diagnosis: Critical limb ischemia of the                         left lower extremity with tissue loss                          Postoperative diagnosis: Same  H)  12/11/2022   IRONPCTSAT 66 (H) 11/06/2022   IRONPCTSAT 49 (H) 10/04/2022   Lab Results  Component Value Date   LDH 135 01/23/2023   LDH 128 01/01/2023   LDH 131 12/11/2022     STUDIES:   VAS [place] LOWER EXTREMITY BYPASS GRAFT DUPLEX  Result Date: 12/26/2022 LOWER EXTREMITY ARTERIAL DUPLEX STUDY Patient Name:  Aaron Sexton  Date of Exam:   12/26/2022 Medical Rec #: [id number]        Accession #:    [id number] Date of Birth: March 28, 1946       Patient Gender: M Patient Age:   77 years Exam Location:  [facility] Procedure:      VAS [place] LOWER EXTREMITY BYPASS GRAFT DUPLEX Referring Phys: Sherald Hess --------------------------------------------------------------------------------  Indications: Peripheral artery disease. High Risk Factors: Hyperlipidemia.  Vascular Interventions: Date: December 09, 2021                          Preoperative diagnosis: Critical limb ischemia of the                         left lower extremity with tissue loss                          Postoperative diagnosis: Same                          Procedure:                         1. Left common femoral endarterectomy including                         endarterectomy of the distal external iliac artery and                         profunda with bovine pericardial patch angioplasty                         2. Left common femoral to above-knee popliteal bypass                         with 6 mm ringed PTFE                         3. Left iliac arteriogram                         4. Angioplasty and stent of the left common iliac artery                         (7 mm x 39 mm VBX)                         5. Angioplasty and stent of the left external iliac                         artery (7 mm x 100 mm drug-coated Eluvia post-dilated  6. Left lower extremity arteriogram.   Performing Technologist: Dorthula Matas RVS, RCS  Examination Guidelines: A  complete evaluation includes B-mode imaging, spectral Doppler, color Doppler, and power Doppler as needed of all accessible portions of each vessel. Bilateral testing is considered an integral part of a complete examination. Limited examinations for reoccurring indications may be performed as noted.  Left Stent(s): +---------------+--------+--------+--------+--------+ EIA            PSV cm/sStenosisWaveformComments +---------------+--------+--------+--------+--------+ Prox to Stent  297             biphasic         +---------------+--------+--------+--------+--------+ Proximal Stent 252             biphasic         +---------------+--------+--------+--------+--------+ Mid Stent      302             biphasic         +---------------+--------+--------+--------+--------+ Distal Stent   262             biphasic         +---------------+--------+--------+--------+--------+ Distal to Stent225             biphasic         +---------------+--------+--------+--------+--------+  +--------------+--------+--------+--------+--------+ CIA           PSV cm/sStenosisWaveformComments +--------------+--------+--------+--------+--------+ Prox to Stent 158             biphasic         +--------------+--------+--------+--------+--------+ Proximal Stent231             biphasic         +--------------+--------+--------+--------+--------+ Mid Stent     255             biphasic         +--------------+--------+--------+--------+--------+ Distal Stent  297             biphasic         +--------------+--------+--------+--------+--------+   Summary: Stenosis: +-------------------+---------------+ Location           Stent           +-------------------+---------------+ 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   Electronically signed by Sherald Hess MD on 12/26/2022 at 11:25:22 AM.    Final    VAS [place] ABI WITH/WO TBI  Result Date: 12/26/2022  LOWER EXTREMITY DOPPLER STUDY Patient Name:  Aaron Sexton  Date of Exam:   12/26/2022 Medical Rec #: [id number]        Accession #:    [id number] Date of Birth: [birth year]       Patient Gender: M Patient Age:   67 years Exam Location:  [facility] Procedure:      VAS [place] ABI WITH/WO TBI Referring Phys: --------------------------------------------------------------------------------  Indications: Peripheral artery disease. Left bypass evaluation. High Risk Factors: Hyperlipidemia.  Vascular Interventions: Date: December 09, 2021                          Preoperative diagnosis: Critical limb ischemia of the                         left lower extremity with tissue loss                          Postoperative diagnosis: Same  6. Left lower extremity arteriogram.   Performing Technologist: Dorthula Matas RVS, RCS  Examination Guidelines: A  complete evaluation includes B-mode imaging, spectral Doppler, color Doppler, and power Doppler as needed of all accessible portions of each vessel. Bilateral testing is considered an integral part of a complete examination. Limited examinations for reoccurring indications may be performed as noted.  Left Stent(s): +---------------+--------+--------+--------+--------+ EIA            PSV cm/sStenosisWaveformComments +---------------+--------+--------+--------+--------+ Prox to Stent  297             biphasic         +---------------+--------+--------+--------+--------+ Proximal Stent 252             biphasic         +---------------+--------+--------+--------+--------+ Mid Stent      302             biphasic         +---------------+--------+--------+--------+--------+ Distal Stent   262             biphasic         +---------------+--------+--------+--------+--------+ Distal to Stent225             biphasic         +---------------+--------+--------+--------+--------+  +--------------+--------+--------+--------+--------+ CIA           PSV cm/sStenosisWaveformComments +--------------+--------+--------+--------+--------+ Prox to Stent 158             biphasic         +--------------+--------+--------+--------+--------+ Proximal Stent231             biphasic         +--------------+--------+--------+--------+--------+ Mid Stent     255             biphasic         +--------------+--------+--------+--------+--------+ Distal Stent  297             biphasic         +--------------+--------+--------+--------+--------+   Summary: Stenosis: +-------------------+---------------+ Location           Stent           +-------------------+---------------+ 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   Electronically signed by Sherald Hess MD on 12/26/2022 at 11:25:22 AM.    Final    VAS [place] ABI WITH/WO TBI  Result Date: 12/26/2022  LOWER EXTREMITY DOPPLER STUDY Patient Name:  Aaron Sexton  Date of Exam:   12/26/2022 Medical Rec #: [id number]        Accession #:    [id number] Date of Birth: [birth year]       Patient Gender: M Patient Age:   67 years Exam Location:  [facility] Procedure:      VAS [place] ABI WITH/WO TBI Referring Phys: --------------------------------------------------------------------------------  Indications: Peripheral artery disease. Left bypass evaluation. High Risk Factors: Hyperlipidemia.  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

## 2023-01-24 ENCOUNTER — Inpatient Hospital Stay: Payer: Medicare PPO

## 2023-01-24 ENCOUNTER — Other Ambulatory Visit: Payer: Self-pay | Admitting: Hematology

## 2023-01-24 DIAGNOSIS — C9200 Acute myeloblastic leukemia, not having achieved remission: Secondary | ICD-10-CM | POA: Diagnosis not present

## 2023-01-24 DIAGNOSIS — I2510 Atherosclerotic heart disease of native coronary artery without angina pectoris: Secondary | ICD-10-CM | POA: Diagnosis not present

## 2023-01-24 DIAGNOSIS — Z8719 Personal history of other diseases of the digestive system: Secondary | ICD-10-CM | POA: Diagnosis not present

## 2023-01-24 DIAGNOSIS — D469 Myelodysplastic syndrome, unspecified: Secondary | ICD-10-CM

## 2023-01-24 DIAGNOSIS — R059 Cough, unspecified: Secondary | ICD-10-CM | POA: Diagnosis not present

## 2023-01-24 DIAGNOSIS — K648 Other hemorrhoids: Secondary | ICD-10-CM | POA: Diagnosis not present

## 2023-01-24 DIAGNOSIS — M1990 Unspecified osteoarthritis, unspecified site: Secondary | ICD-10-CM | POA: Diagnosis not present

## 2023-01-24 DIAGNOSIS — K5730 Diverticulosis of large intestine without perforation or abscess without bleeding: Secondary | ICD-10-CM | POA: Diagnosis not present

## 2023-01-24 MED ORDER — DIPHENHYDRAMINE HCL 25 MG PO CAPS: 25.0000 mg | ORAL_CAPSULE | Freq: Once | ORAL | Status: DC

## 2023-01-24 MED ORDER — SODIUM CHLORIDE 0.9% IV SOLUTION
250.0000 mL | Freq: Once | INTRAVENOUS | Status: AC
  Administered 2023-01-24: 250 mL via INTRAVENOUS

## 2023-01-24 MED ORDER — ACETAMINOPHEN 325 MG PO TABS: 650.0000 mg | ORAL_TABLET | Freq: Once | ORAL | Status: DC

## 2023-01-24 NOTE — Progress Notes (Signed)
Patient took Tylenol and Benadryl from home at 0715.  Patient stated he has a cough and runny nose and a temp 99.5 last night.  Today his temp is 101.2 Tympanic and rechecked with temp 98 oral.  Dr. Anders Simmonds notified.  Ok to proceed with transfusion verbal order Dr. Anders Simmonds.    Patient tolerated transfusion with no complaints voiced.  Side effects with management reviewed with understanding verbalized.  Peripheral IV site clean and dry with no bruising or swelling noted at site.  Good blood return noted before and after administration.  Band aid applied.  Patient left in satisfactory condition with VSS and no s/s of distress noted.

## 2023-01-24 NOTE — Patient Instructions (Signed)
[facility]  Discharge Instructions: Thank you for choosing [facility] to provide your oncology and hematology care.  If you have a lab appointment with the Cancer Center - please note that after April 8th, 2024, all labs will be drawn in the cancer center.  You do not have to check in or register with the main entrance as you have in the past but will complete your check-in in the cancer center.  Wear comfortable clothing and clothing appropriate for easy access to any Portacath or PICC line.   We strive to give you quality time with your provider. You may need to reschedule your appointment if you arrive late (15 or more minutes).  Arriving late affects you and other patients whose appointments are after yours.  Also, if you miss three or more appointments without notifying the office, you may be dismissed from the clinic at the provider's discretion.      For prescription refill requests, have your pharmacy contact our office and allow 72 hours for refills to be completed.      To help prevent nausea and vomiting after your treatment, we encourage you to take your nausea medication as directed.  BELOW ARE SYMPTOMS THAT SHOULD BE REPORTED IMMEDIATELY: *FEVER GREATER THAN 100.4 F (38 C) OR HIGHER *CHILLS OR SWEATING *NAUSEA AND VOMITING THAT IS NOT CONTROLLED WITH YOUR NAUSEA MEDICATION *UNUSUAL SHORTNESS OF BREATH *UNUSUAL BRUISING OR BLEEDING *URINARY PROBLEMS (pain or burning when urinating, or frequent urination) *BOWEL PROBLEMS (unusual diarrhea, constipation, pain near the anus) TENDERNESS IN MOUTH AND THROAT WITH OR WITHOUT PRESENCE OF ULCERS (sore throat, sores in mouth, or a toothache) UNUSUAL RASH, SWELLING OR PAIN  UNUSUAL VAGINAL DISCHARGE OR ITCHING   Items with * indicate a potential emergency and should be followed up as soon as possible or go to the Emergency Department if any problems should occur.  Please show the CHEMOTHERAPY ALERT  CARD or IMMUNOTHERAPY ALERT CARD at check-in to the Emergency Department and triage nurse.  Should you have questions after your visit or need to cancel or reschedule your appointment, please contact [facility] [phone]  and follow the prompts.  Office hours are 8:00 a.m. to 4:30 p.m. Monday - Friday. Please note that voicemails left after 4:00 p.m. may not be returned until the following business day.  We are closed weekends and major holidays. You have access to a nurse at all times for urgent questions. Please call the main number to the clinic [phone] and follow the prompts.  For any non-urgent questions, you may also contact your provider using MyChart. We now offer e-Visits for anyone 80 and older to request care online for non-urgent symptoms. For details visit [email or web address].   Also download the MyChart app! Go to the app store, search "MyChart", open the app, select [facility], and log in with your MyChart username and password.

## 2023-01-25 ENCOUNTER — Other Ambulatory Visit: Payer: Self-pay

## 2023-01-25 DIAGNOSIS — D469 Myelodysplastic syndrome, unspecified: Secondary | ICD-10-CM

## 2023-01-25 LAB — BPAM RBC
Blood Product Expiration Date: 202410082359
ISSUE DATE / TIME: 202409180926
Unit Type and Rh: 1700

## 2023-01-25 LAB — TYPE AND SCREEN
ABO/RH(D): B POS
Antibody Screen: NEGATIVE
Unit division: 0

## 2023-01-25 MED ORDER — LENALIDOMIDE 10 MG PO CAPS: 10.0000 mg | ORAL_CAPSULE | Freq: Every day | ORAL | 0 refills | Status: DC

## 2023-01-25 NOTE — Telephone Encounter (Signed)
Chart reviewed. Revlimid refilled per last office note with Dr. Katragadda.  

## 2023-02-01 ENCOUNTER — Other Ambulatory Visit: Payer: Self-pay | Admitting: Internal Medicine

## 2023-02-12 ENCOUNTER — Other Ambulatory Visit: Payer: Self-pay

## 2023-02-12 ENCOUNTER — Encounter: Payer: Self-pay | Admitting: Internal Medicine

## 2023-02-12 ENCOUNTER — Ambulatory Visit: Payer: Medicare PPO | Admitting: Internal Medicine

## 2023-02-12 VITALS — BP 130/62 | HR 73 | Temp 98.4°F | Ht 67.5 in | Wt 200.0 lb

## 2023-02-12 DIAGNOSIS — I779 Disorder of arteries and arterioles, unspecified: Secondary | ICD-10-CM

## 2023-02-12 DIAGNOSIS — D469 Myelodysplastic syndrome, unspecified: Secondary | ICD-10-CM

## 2023-02-12 DIAGNOSIS — K219 Gastro-esophageal reflux disease without esophagitis: Secondary | ICD-10-CM

## 2023-02-12 DIAGNOSIS — Z0000 Encounter for general adult medical examination without abnormal findings: Secondary | ICD-10-CM

## 2023-02-12 DIAGNOSIS — I739 Peripheral vascular disease, unspecified: Secondary | ICD-10-CM | POA: Diagnosis not present

## 2023-02-12 DIAGNOSIS — D649 Anemia, unspecified: Secondary | ICD-10-CM

## 2023-02-12 DIAGNOSIS — Z23 Encounter for immunization: Secondary | ICD-10-CM | POA: Diagnosis not present

## 2023-02-12 DIAGNOSIS — I10 Essential (primary) hypertension: Secondary | ICD-10-CM

## 2023-02-12 DIAGNOSIS — Z1159 Encounter for screening for other viral diseases: Secondary | ICD-10-CM

## 2023-02-12 NOTE — Progress Notes (Signed)
Lab orders entered

## 2023-02-12 NOTE — Progress Notes (Signed)
Subjective:    Patient ID: Aaron Sexton, male    DOB: 1945/09/17, 77 y.o.   MRN: [id number]  HPI Here with wife for Medicare wellness visit and follow up of chronic health conditions Reviewed advanced directives Reviewed other doctors---Dr Katragadda--oncology, Dr Sarina Ill surgery, Dr Ty Hilts, [facility] dentist, Dr Zorita Pang, Dr Marsh [place]--cardiology No hospitalizations or surgery in the past year--did have EGD/colon and bone marrow biopsy Does some chair exercises--not much He cooks breakfast at times---but wife does most of the instrumental ADLs No alcohol or tobacco  No falls Vision is okay Hearing is fine No memory problems  Ongoing oncology treatment 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 Resolves with elevation No headaches  Current Outpatient Medications on File Prior to Visit  Medication Sig Dispense Refill   aspirin 81 MG tablet Take 81 mg by mouth every evening.     chlorthalidone (HYGROTON) 25 MG tablet TAKE 1/2 TABLET(12.5 MG) BY MOUTH DAILY 30 tablet 3   clopidogrel (PLAVIX) 75 MG tablet TAKE 1 TABLET(75 MG) BY MOUTH DAILY WITH BREAKFAST 90 tablet 3   Cyanocobalamin (VITAMIN B-12) 500 MCG SUBL Place 500 mcg under the tongue daily.     lenalidomide (REVLIMID) 10 MG capsule Take 1 capsule (10 mg total) by mouth daily. 28 capsule 0   loperamide (IMODIUM A-D) 2 MG tablet Take 2 mg by mouth 4 (four) times daily as needed for diarrhea or loose stools.     losartan (COZAAR) 25 MG tablet TAKE 1 TABLET(25 MG) BY MOUTH DAILY 90 tablet 3   metoprolol succinate (TOPROL-XL) 100 MG 24 hr tablet TAKE 1 TABLET(100 MG) BY  MOUTH DAILY WITH OR IMMEDIATELY FOLLOWING A MEAL 90 tablet 1   nitroGLYCERIN (NITROSTAT) 0.4 MG SL tablet Place 1 tablet (0.4 mg total) under the tongue every 5 (five) minutes as needed for chest pain. 25 tablet 1   Omega-3 Fatty Acids (FISH OIL) 1000 MG CAPS Take 1,000 mg by mouth daily.     omeprazole (PRILOSEC) 20 MG capsule Take 20 mg by mouth every other day.     pravastatin (PRAVACHOL) 40 MG tablet TAKE 1 TABLET(40 MG) BY MOUTH DAILY 90 tablet 3   traMADol (ULTRAM) 50 MG tablet TAKE 1 TABLET BY MOUTH EVERY 8 HOURS AS NEEDED 90 tablet 0   No current facility-administered medications on file prior to visit.    Allergies  Allergen Reactions   Cardizem [Diltiazem Hcl] Hives and Rash    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   Cerebrovascular disease    Moderate ASVD without focal stenosis in 01/2007   Clotting disorder (HCC)    Colon polyps 07/2010   single 2mm polyp--tubular adenoma   Diverticulosis 2012   found on colonoscopy   Erectile dysfunction    Family history of adverse reaction to anesthesia    difficult for son & sistor to wake    GERD (gastroesophageal reflux disease)    Hematochezia 05/24/2010   Hyperlipidemia    Hypertension    Osteoarthritis    knees/hands-Dr Sherlean Foot   Overweight(278.02)    Peripheral vascular disease (  HCC) 03/11/2010   Moderate SFA stenosis; history of claudication   Pneumonia    Rosacea    Vitamin B12 deficiency     Past Surgical History:  Procedure Laterality Date   APPENDECTOMY     APPLICATION OF WOUND VAC Left 12/17/2021   Procedure: APPLICATION OF PREVENA WOUND VAC LEFT GROIN;  Surgeon: Victorino Sparrow, MD;  Location: [facility] OR;  Service: Vascular;  Laterality: Left;   APPLICATION OF WOUND VAC Left 01/08/2022   Procedure: APPLICATION OF WOUND VAC left groin.;  Surgeon: Cephus Shelling, MD;  Location: [facility] OR;  Service:  Vascular;  Laterality: Left;   COLONOSCOPY W/ POLYPECTOMY  2012   ENDARTERECTOMY FEMORAL Left 12/09/2021   Procedure: LEFT COMMON FEMORAL ENDARTERECTOMY WITH 1 CM X 6 CM XENOSURE BOVINE PATCH ANGIOPLASTY;  Surgeon: Cephus Shelling, MD;  Location: [facility] OR;  Service: Vascular;  Laterality: Left;   FEMORAL-POPLITEAL BYPASS GRAFT Left 12/09/2021   Procedure: LEFT FEMORAL- ABOVE KNEE POPLITEAL BYPASS WITH 6 mm x 80 cm PROPATEN GRAFT;  Surgeon: Cephus Shelling, MD;  Location: [facility] OR;  Service: Vascular;  Laterality: Left;  INSERT ARTERIAL LINE   ILIAC VEIN ANGIOPLASTY / STENTING Right 08/09/2015   INCISION AND DRAINAGE OF WOUND Left 01/08/2022   Procedure: IRRIGATION AND DEBRIDEMENT GROIN LEFT application of Myriad Morcells.;  Surgeon: Cephus Shelling, MD;  Location: [facility] OR;  Service: Vascular;  Laterality: Left;   INSERTION OF ILIAC STENT  12/09/2021   Procedure: INSERTION OF LEFT COMMON AND EXTERNAL  ILIAC STENTS WITH ANGIOPLASTY;  Surgeon: Cephus Shelling, MD;  Location: [facility] OR;  Service: Vascular;;   LOWER EXTREMITY ANGIOGRAM Left 12/09/2021   Procedure: LEFT ILIAC AND LOWER EXTREMITY ANGIOGRAM;  Surgeon: Cephus Shelling, MD;  Location: [facility] OR;  Service: Vascular;  Laterality: Left;   LOWER EXTREMITY ANGIOGRAPHY Bilateral 11/03/2021   Procedure: Lower Extremity Angiography;  Surgeon: Runell Gess, MD;  Location: [facility] INVASIVE CV LAB;  Service: Cardiovascular;  Laterality: Bilateral;  Limited Study   PERIPHERAL VASCULAR CATHETERIZATION Bilateral 06/21/2015   Procedure: Lower Extremity Angiography;  Surgeon: Runell Gess, MD;  Location: [facility] INVASIVE CV LAB;  Service: Cardiovascular;  Laterality: Bilateral;   PERIPHERAL VASCULAR CATHETERIZATION N/A 06/21/2015   Procedure: Abdominal Aortogram;  Surgeon: Runell Gess, MD;  Location: [facility] INVASIVE CV LAB;  Service: Cardiovascular;  Laterality: N/A;   PERIPHERAL VASCULAR CATHETERIZATION N/A 07/26/2015   Procedure: Lower Extremity Angiography;   Surgeon: Runell Gess, MD;  Location: [facility] INVASIVE CV LAB;  Service: Cardiovascular;  Laterality: N/A;   PERIPHERAL VASCULAR CATHETERIZATION N/A 08/09/2015   Procedure: Lower Extremity Angiography;  Surgeon: Runell Gess, MD;  Location: [facility] INVASIVE CV LAB;  Service: Cardiovascular;  Laterality: N/A;   PERIPHERAL VASCULAR CATHETERIZATION  08/09/2015   Procedure: Peripheral Vascular Intervention;  Surgeon: Runell Gess, MD;  Location: [facility] INVASIVE CV LAB;  Service: Cardiovascular;;  rt ext. iliac atherectomy and stent   PERIPHERAL VASCULAR CATHETERIZATION N/A 09/20/2015   Procedure: Lower Extremity Angiography;  Surgeon: Runell Gess, MD;  Location: [facility] INVASIVE CV LAB;  Service: Cardiovascular;  Laterality: N/A;   PERIPHERAL VASCULAR CATHETERIZATION Right 09/20/2015   Procedure: Peripheral Vascular Intervention;  Surgeon: Runell Gess, MD;  Location: [facility] INVASIVE CV LAB;  Service: Cardiovascular;  Laterality: Right;  SFA   ROTATOR CUFF REPAIR Left 7/15   Dr Sherlean Foot   WOUND EXPLORATION Left 12/17/2021   Procedure: LEFT LEG WOUND EXPLORATION AND WASH OUT WITH MYRIAD MORCELLS;  Surgeon: Karin Lieu,  Clementeen Graham, MD;  Location: [facility] OR;  Service: Vascular;  Laterality: Left;    Family History  Problem Relation Age of Onset   Lung cancer Mother    Diabetes Mother    Hypertension Father        And siblings   Heart disease Father        And second-degree relatives   Transient ischemic attack Father    Brain cancer Sister    Heart disease Sister    Heart disease Sister    Thyroid disease Sister    Diabetes Brother    Atrial fibrillation Brother    Ulcers Son    Diabetes Son    Diabetes Son    Colon cancer Neg Hx    Stomach cancer Neg Hx    Rectal cancer Neg Hx     Social History   Socioeconomic History   Marital status: Married    Spouse name: Not on file   Number of children: 3   Years of education: Not on file   Highest education level: Not on file  Occupational History    Occupation: Managed supply chain--- retired 2013    Comment: [employer]  Tobacco Use   Smoking status: Former    Current packs/day: 0.00    Average packs/day: 1 pack/day for 30.0 years (30.0 ttl pk-yrs)    Types: Cigarettes    Start date: 05/16/1960    Quit date: 05/16/1990    Years since quitting: 32.7    Passive exposure: Never   Smokeless tobacco: Never   Tobacco comments:    Quit in 1993  Vaping Use   Vaping status: Never Used  Substance and Sexual Activity   Alcohol use: No    Alcohol/week: 0.0 standard drinks of alcohol   Drug use: No   Sexual activity: Not on file  Other Topics Concern   Not on file  Social History Narrative   Has living will   Wife is health care POA---alternate is son or daughter   Would accept resuscitation but no prolonged artificial ventilation.   No tube feeds if cognitively unaware   Social Determinants of Health   Financial Resource Strain: Low Risk  (03/10/2020)   Overall Financial Resource Strain (CARDIA)    Difficulty of Paying Living Expenses: Not hard at all  Food Insecurity: No Food Insecurity (01/13/2022)   Hunger Vital Sign    Worried About Running Out of Food in the Last Year: Never true    Ran Out of Food in the Last Year: Never true  Transportation Needs: No Transportation Needs (01/13/2022)   PRAPARE - Administrator, Civil Service (Medical): No    Lack of Transportation (Non-Medical): No  Physical Activity: Inactive (03/10/2020)   Exercise Vital Sign    Days of Exercise per Week: 0 days    Minutes of Exercise per Session: 0 min  Stress: No Stress Concern Present (03/10/2020)   [employer] of Occupational Health - Occupational Stress Questionnaire    Feeling of Stress : Not at all  Social Connections: Moderately Integrated (03/10/2020)   Social Connection and Isolation Panel [NHANES]    Frequency of Communication with Friends and Family: More than three times a week    Frequency of Social Gatherings  with Friends and Family: Never    Attends Religious Services: More than 4 times per year    Active Member of [employer] or Organizations: No    Attends Banker Meetings: Never  Marital Status: Married  Catering manager Violence: Not At Risk (03/10/2020)   Humiliation, Afraid, Rape, and Kick questionnaire    Fear of Current or Ex-Partner: No    Emotionally Abused: No    Physically Abused: No    Sexually Abused: No   Review of Systems Appetite is good Weight stable Sleeps okay Wears seat belt Teeth are okay---keeps up with dentist No suspicious skin lesions Has another cyst on his back No heartburn or dysphagia--taking omeprazole every other day for gastritis Bowels move fine Some back pain--uses tramadol daily usually    Objective:   Physical Exam Constitutional:      Appearance: Normal appearance.  HENT:     Mouth/Throat:     Pharynx: No oropharyngeal exudate or posterior oropharyngeal erythema.  Eyes:     Conjunctiva/sclera: Conjunctivae normal.     Pupils: Pupils are equal, round, and reactive to light.  Cardiovascular:     Rate and Rhythm: Normal rate and regular rhythm.     Heart sounds: No murmur heard.    No gallop.     Comments: Feet warm 1+ pulse on left--absent on right Pulmonary:     Effort: Pulmonary effort is normal.     Breath sounds: Normal breath sounds. No wheezing or rales.  Abdominal:     Palpations: Abdomen is soft.     Tenderness: There is no abdominal tenderness.  Musculoskeletal:     Cervical back: Neck supple.     Right lower leg: No edema.     Left lower leg: No edema.  Lymphadenopathy:     Cervical: No cervical adenopathy.  Skin:    Findings: No lesion or rash.  Neurological:     General: No focal deficit present.     Mental Status: He is alert and oriented to person, place, and time.     Comments: Word naming--- 5/1 minute Recall 2/3  Psychiatric:        Mood and Affect: Mood normal.        Behavior: Behavior normal.             Assessment & Plan:

## 2023-02-12 NOTE — Assessment & Plan Note (Signed)
Is on ASA 81mg , plavix and pravastatin 40mg 

## 2023-02-12 NOTE — Progress Notes (Signed)
Hearing Screening - Comments:: Passed whisper test Vision Screening - Comments:: April 2024  

## 2023-02-12 NOTE — Assessment & Plan Note (Signed)
Now on revlimid Going back to oncology tomorrow--will get labs there

## 2023-02-12 NOTE — Assessment & Plan Note (Signed)
I have personally reviewed the Medicare Annual Wellness questionnaire and have noted 1. The patient's medical and social history 2. Their use of alcohol, tobacco or illicit drugs 3. Their current medications and supplements 4. The patient's functional ability including ADL's, fall risks, home safety risks and hearing or visual             impairment. 5. Diet and physical activities 6. Evidence for depression or mood disorders  The patients weight, height, BMI and visual acuity have been recorded in the chart I have made referrals, counseling and provided education to the patient based review of the above and I have provided the pt with a written personalized care plan for preventive services.  I have provided you with a copy of your personalized plan for preventive services. Please take the time to review along with your updated medication list.  Done with cancer screening Limited ability to exerise--discussed Flu vaccine today Not excited about updated COVID or RSV vaccines

## 2023-02-12 NOTE — Assessment & Plan Note (Signed)
BP Readings from Last 3 Encounters:  02/12/23 130/62  01/24/23 108/81  01/23/23 (!) 110/53   Controlled on losartan 25 and metoprolol 100 daily

## 2023-02-12 NOTE — Assessment & Plan Note (Signed)
With some gastric erosions on recent EGD On omeprazole 20 every other day

## 2023-02-12 NOTE — Progress Notes (Signed)
[facility] [address] [place] [address]    Clinic Day:  02/13/2023  Referring physician: Karie Schwalbe, MD  Patient Care Team: Karie Schwalbe, MD as PCP - General (Internal Medicine) Wyline Mood Dorothe Pea, MD as PCP - Cardiology (Cardiology) Doreatha Massed, MD as Medical Oncologist (Hematology)   ASSESSMENT & PLAN:   Assessment: 1.  Hereditary hemochromatosis: -Patient seen for elevated ferritin levels. -EMR evaluation shows hemochromatosis testing on 01/27/2019 which showed heterozygosity for C282Y and H63D variants. -Given the elevated ferritin levels above 1000 and the results of genetic testing, this is compatible with hereditary hemochromatosis. -Patient does have arthritis of the small joints of the hands and arthritis of the back. -No history of CHF but has CAD.  No diabetes. -MRI of the liver on 12/11/2019 shows estimated liver iron concentration approximately 7.7 mg/g compatible with moderate liver iron.  Estimated fat fraction ranges from 7-12%, more pronounced in the right lobe compared to left hepatic lobe.  No focal hepatic lesions seen.  No morphological changes of significant liver disease. -Jadenu 540 mg daily started on 12/24/2019, dose increased to 720 mg daily on 02/09/2020.  - MRI liver (09/21/2021): Diminished liver iron concentration less than 2 mg/g, slightly below "mild" designation (previously well into the "moderate" range). - Dose decreased to 2 tablets daily on 07/04/2022   2.  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 Myrtie Neither on 02/17/2019 shows diverticulosis in the left colon, 12 mm polyp in the cecum, 4 to 6 mm polyp in the ascending colon and 5 mm polyp in the descending colon.  Pathology was consistent with tubular adenomas. - Colonoscopy (11/30/2022): 8 mm polyp in the transverse colon.  Multiple diverticula found in the left colon.  Internal hemorrhoids were found.  Exam was otherwise normal. - EGD  (11/30/2022): Normal esophagus.  Multiple erosions with no bleeding and no stigmata of recent bleeding in the prepyloric region of the stomach. - Serum EPO level 54.4 - NGS: SF3B1 (variant frequency 31%) - Bone marrow biopsy: Hypercellular marrow with dyspoietic changes primarily involving erythroid series associated with ring sideroblasts.  No increase in blasts.  Findings consistent with low-grade MDS with ring sideroblasts (More than 15% of erythroid precursors) - Karyotype: 46, XY,del(20)(q11.2q13.3)[3]/46,XY[17] - MDS FISH: Deletion 5 q. and deletion 20 q. - IPSS-R: Score 2.5 points, low risk MDS - IPSS-M: Score 0.83.  Leukemia free survival: 5.9 years, 1.7% by 1 year and 5.1% by 4 years AML transformation. 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 - Revlimid 10 mg daily started on 01/05/2023  Plan: 1.  Hereditary hemochromatosis: - [name] is on hold since 09/13/2022 due to severe anemia. - Ferritin today is 389 with percent saturation of 50.  Will closely monitor.   2.  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 - He reports diarrhea which happens 1-2 times per week.  He takes Imodium which helps. - Today he denies any chest pain. - Reviewed labs today: Normal LFTs.  Creatinine elevated at 1.50.  CBC shows white count 2.6 with ANC of 1.0.  Platelet count is trending down at 60.  Hemoglobin is 8.2. - Last PRBC transfusion on 01/23/2023.  Hemoglobin is better since Revlimid started.  However he developed thrombocytopenia and leukopenia. - I have recommended holding 10 mg Revlimid.  We will send prescription for Revlimid 5 mg daily. - He will start 5 mg Revlimid  when he gets the medication.  RTC 3 weeks for follow-up.  3.  Elevated creatinine and hypokalemia: - Creatinine elevated last 2 times between 1.5-1.67.  Likely effect of Revlimid although rare.  Will closely monitor. - Potassium is  also low likely from diarrhea.  He will receive oral potassium today x 1.  If it continues to be low, will start potassium at home.      Orders Placed This Encounter  Procedures   CBC with Differential    Standing Status:   Future    Standing Expiration Date:   02/13/2024   Comprehensive metabolic panel    Standing Status:   Future    Standing Expiration Date:   02/13/2024   Lactate dehydrogenase    Standing Status:   Future    Standing Expiration Date:   02/13/2024      Mikeal Hawthorne R Teague,acting as a scribe for Doreatha Massed, MD.,have documented all relevant documentation on the behalf of Doreatha Massed, MD,as directed by  Doreatha Massed, MD while in the presence of Doreatha Massed, MD.  I, Doreatha Massed MD, have reviewed the above documentation for accuracy and completeness, and I agree with the above.     Doreatha Massed, MD   10/8/20246:02 PM  CHIEF COMPLAINT:   Diagnosis: hereditary hemochromatosis and low risk MDS   Cancer Staging  No matching staging information was found for the patient.    Prior Therapy: Jadenu  Current Therapy: Revlimid 10 mg daily   HISTORY OF PRESENT ILLNESS:   Oncology History   No history exists.     INTERVAL HISTORY:   Aaron Sexton is a 77 y.o. male presenting to clinic today for follow up of hereditary hemochromatosis. He was last seen by me on 01/23/23.  Today, he states that he is doing well overall. His appetite level is at 100%. His energy level is at 80%. He is accompanied by his wife.   He reports a normal appetite. He notes tiredness and intermittent diarrhea from treatment. He takes Imodium prn for diarrhea that occurs 1x a week. He denies any other side effects from treatment, including chest pain, bleeding issues, or abnormal bruising. He does not take potassium supplements.   PAST MEDICAL HISTORY:   Past Medical History: Past Medical History:  Diagnosis Date   Anemia    Arteriosclerotic  cardiovascular disease (ASCVD) 1993   Critical RCA disease in 1993 treated with PTCA   Cerebrovascular disease    Moderate ASVD without focal stenosis in 01/2007   Clotting disorder (HCC)    Colon polyps 07/2010   single 2mm polyp--tubular adenoma   Diverticulosis 2012   found on colonoscopy   Erectile dysfunction    Family history of adverse reaction to anesthesia    difficult for son & sistor to wake    GERD (gastroesophageal reflux disease)    Hematochezia 05/24/2010   Hyperlipidemia    Hypertension    Osteoarthritis    knees/hands-Dr Lucey   Overweight(278.02)    Peripheral vascular disease (HCC) 03/11/2010   Moderate SFA stenosis; history of claudication   Pneumonia    Rosacea    Vitamin B12 deficiency     Surgical History: Past Surgical History:  Procedure Laterality Date   APPENDECTOMY     APPLICATION OF WOUND VAC Left 12/17/2021   Procedure: APPLICATION OF PREVENA WOUND VAC LEFT GROIN;  Surgeon: Victorino Sparrow, MD;  Location: [facility] OR;  Service: Vascular;  Laterality: Left;   APPLICATION OF WOUND VAC Left 01/08/2022  Procedure: APPLICATION OF WOUND VAC left groin.;  Surgeon: Cephus Shelling, MD;  Location: [facility] OR;  Service: Vascular;  Laterality: Left;   COLONOSCOPY W/ POLYPECTOMY  2012   ENDARTERECTOMY FEMORAL Left 12/09/2021   Procedure: LEFT COMMON FEMORAL ENDARTERECTOMY WITH 1 CM X 6 CM XENOSURE BOVINE PATCH ANGIOPLASTY;  Surgeon: Cephus Shelling, MD;  Location: [facility] OR;  Service: Vascular;  Laterality: Left;   FEMORAL-POPLITEAL BYPASS GRAFT Left 12/09/2021   Procedure: LEFT FEMORAL- ABOVE KNEE POPLITEAL BYPASS WITH 6 mm x 80 cm PROPATEN GRAFT;  Surgeon: Cephus Shelling, MD;  Location: [facility] OR;  Service: Vascular;  Laterality: Left;  INSERT ARTERIAL LINE   ILIAC VEIN ANGIOPLASTY / STENTING Right 08/09/2015   INCISION AND DRAINAGE OF WOUND Left 01/08/2022   Procedure: IRRIGATION AND DEBRIDEMENT GROIN LEFT application of Myriad Morcells.;  Surgeon: Cephus Shelling, MD;  Location: [facility] OR;  Service: Vascular;  Laterality: Left;   INSERTION OF ILIAC STENT  12/09/2021   Procedure: INSERTION OF LEFT COMMON AND EXTERNAL  ILIAC STENTS WITH ANGIOPLASTY;  Surgeon: Cephus Shelling, MD;  Location: [facility] OR;  Service: Vascular;;   LOWER EXTREMITY ANGIOGRAM Left 12/09/2021   Procedure: LEFT ILIAC AND LOWER EXTREMITY ANGIOGRAM;  Surgeon: Cephus Shelling, MD;  Location: [facility] OR;  Service: Vascular;  Laterality: Left;   LOWER EXTREMITY ANGIOGRAPHY Bilateral 11/03/2021   Procedure: Lower Extremity Angiography;  Surgeon: Runell Gess, MD;  Location: [facility] INVASIVE CV LAB;  Service: Cardiovascular;  Laterality: Bilateral;  Limited Study   PERIPHERAL VASCULAR CATHETERIZATION Bilateral 06/21/2015   Procedure: Lower Extremity Angiography;  Surgeon: Runell Gess, MD;  Location: [facility] INVASIVE CV LAB;  Service: Cardiovascular;  Laterality: Bilateral;   PERIPHERAL VASCULAR CATHETERIZATION N/A 06/21/2015   Procedure: Abdominal Aortogram;  Surgeon: Runell Gess, MD;  Location: [facility] INVASIVE CV LAB;  Service: Cardiovascular;  Laterality: N/A;   PERIPHERAL VASCULAR CATHETERIZATION N/A 07/26/2015   Procedure: Lower Extremity Angiography;  Surgeon: Runell Gess, MD;  Location: [facility] INVASIVE CV LAB;  Service: Cardiovascular;  Laterality: N/A;   PERIPHERAL VASCULAR CATHETERIZATION N/A 08/09/2015   Procedure: Lower Extremity Angiography;  Surgeon: Runell Gess, MD;  Location: [facility] INVASIVE CV LAB;  Service: Cardiovascular;  Laterality: N/A;   PERIPHERAL VASCULAR CATHETERIZATION  08/09/2015   Procedure: Peripheral Vascular Intervention;  Surgeon: Runell Gess, MD;  Location: [facility] INVASIVE CV LAB;  Service: Cardiovascular;;  rt ext. iliac atherectomy and stent   PERIPHERAL VASCULAR CATHETERIZATION N/A 09/20/2015   Procedure: Lower Extremity Angiography;  Surgeon: Runell Gess, MD;  Location: [facility] INVASIVE CV LAB;  Service: Cardiovascular;  Laterality: N/A;   PERIPHERAL  VASCULAR CATHETERIZATION Right 09/20/2015   Procedure: Peripheral Vascular Intervention;  Surgeon: Runell Gess, MD;  Location: [facility] INVASIVE CV LAB;  Service: Cardiovascular;  Laterality: Right;  SFA   ROTATOR CUFF REPAIR Left 7/15   Dr Sherlean Foot   WOUND EXPLORATION Left 12/17/2021   Procedure: LEFT LEG WOUND EXPLORATION AND WASH OUT WITH MYRIAD MORCELLS;  Surgeon: Victorino Sparrow, MD;  Location: [facility] OR;  Service: Vascular;  Laterality: Left;    Social History: Social History   Socioeconomic History   Marital status: Married    Spouse name: Not on file   Number of children: 3   Years of education: Not on file   Highest education level: Not on file  Occupational History   Occupation: Managed supply chain--- retired 2013    Comment: [employer]  Tobacco Use   Smoking  status: Former    Current packs/day: 0.00    Average packs/day: 1 pack/day for 30.0 years (30.0 ttl pk-yrs)    Types: Cigarettes    Start date: 05/16/1960    Quit date: 05/16/1990    Years since quitting: 32.7    Passive exposure: Never   Smokeless tobacco: Never   Tobacco comments:    Quit in 1993  Vaping Use   Vaping status: Never Used  Substance and Sexual Activity   Alcohol use: No    Alcohol/week: 0.0 standard drinks of alcohol   Drug use: No   Sexual activity: Not on file  Other Topics Concern   Not on file  Social History Narrative   Has living will   Wife is health care POA---alternate is son or daughter   Would accept resuscitation but no prolonged artificial ventilation.   No tube feeds if cognitively unaware   Social Determinants of Health   Financial Resource Strain: Low Risk  (03/10/2020)   Overall Financial Resource Strain (CARDIA)    Difficulty of Paying Living Expenses: Not hard at all  Food Insecurity: No Food Insecurity (01/13/2022)   Hunger Vital Sign    Worried About Running Out of Food in the Last Year: Never true    Ran Out of Food in the Last Year: Never true   Transportation Needs: No Transportation Needs (01/13/2022)   PRAPARE - Administrator, Civil Service (Medical): No    Lack of Transportation (Non-Medical): No  Physical Activity: Inactive (03/10/2020)   Exercise Vital Sign    Days of Exercise per Week: 0 days    Minutes of Exercise per Session: 0 min  Stress: No Stress Concern Present (03/10/2020)   [employer] of Occupational Health - Occupational Stress Questionnaire    Feeling of Stress : Not at all  Social Connections: Moderately Integrated (03/10/2020)   Social Connection and Isolation Panel [NHANES]    Frequency of Communication with Friends and Family: More than three times a week    Frequency of Social Gatherings with Friends and Family: Never    Attends Religious Services: More than 4 times per year    Active Member of [employer] or Organizations: No    Attends Banker Meetings: Never    Marital Status: Married  Catering manager Violence: Not At Risk (03/10/2020)   Humiliation, Afraid, Rape, and Kick questionnaire    Fear of Current or Ex-Partner: No    Emotionally Abused: No    Physically Abused: No    Sexually Abused: No    Family History: Family History  Problem Relation Age of Onset   Lung cancer Mother    Diabetes Mother    Hypertension Father        And siblings   Heart disease Father        And second-degree relatives   Transient ischemic attack Father    Brain cancer Sister    Heart disease Sister    Heart disease Sister    Thyroid disease Sister    Diabetes Brother    Atrial fibrillation Brother    Ulcers Son    Diabetes Son    Diabetes Son    Colon cancer Neg Hx    Stomach cancer Neg Hx    Rectal cancer Neg Hx     Current Medications:  Current Outpatient Medications:    aspirin 81 MG tablet, Take 81 mg by mouth every evening., Disp: , Rfl:    chlorthalidone (HYGROTON) 25 MG tablet,  TAKE 1/2 TABLET(12.5 MG) BY MOUTH DAILY, Disp: 30 tablet, Rfl: 3   clopidogrel (PLAVIX) 75  MG tablet, TAKE 1 TABLET(75 MG) BY MOUTH DAILY WITH BREAKFAST, Disp: 90 tablet, Rfl: 3   Cyanocobalamin (VITAMIN B-12) 500 MCG SUBL, Place 500 mcg under the tongue daily., Disp: , Rfl:    lenalidomide (REVLIMID) 10 MG capsule, Take 1 capsule (10 mg total) by mouth daily., Disp: 28 capsule, Rfl: 0   loperamide (IMODIUM A-D) 2 MG tablet, Take 2 mg by mouth 4 (four) times daily as needed for diarrhea or loose stools., Disp: , Rfl:    losartan (COZAAR) 25 MG tablet, TAKE 1 TABLET(25 MG) BY MOUTH DAILY, Disp: 90 tablet, Rfl: 3   metoprolol succinate (TOPROL-XL) 100 MG 24 hr tablet, TAKE 1 TABLET(100 MG) BY MOUTH DAILY WITH OR IMMEDIATELY FOLLOWING A MEAL, Disp: 90 tablet, Rfl: 1   Omega-3 Fatty Acids (FISH OIL) 1000 MG CAPS, Take 1,000 mg by mouth daily., Disp: , Rfl:    omeprazole (PRILOSEC) 20 MG capsule, Take 20 mg by mouth every other day., Disp: , Rfl:    pravastatin (PRAVACHOL) 40 MG tablet, TAKE 1 TABLET(40 MG) BY MOUTH DAILY, Disp: 90 tablet, Rfl: 3   traMADol (ULTRAM) 50 MG tablet, TAKE 1 TABLET BY MOUTH EVERY 8 HOURS AS NEEDED, Disp: 90 tablet, Rfl: 0   nitroGLYCERIN (NITROSTAT) 0.4 MG SL tablet, Place 1 tablet (0.4 mg total) under the tongue every 5 (five) minutes as needed for chest pain. (Patient not taking: Reported on 02/13/2023), Disp: 25 tablet, Rfl: 1   Allergies: Allergies  Allergen Reactions   Cardizem [Diltiazem Hcl] Hives and Rash    REVIEW OF SYSTEMS:   Review of Systems  Constitutional:  Negative for chills, fatigue and fever.  HENT:   Negative for lump/mass, mouth sores, nosebleeds, sore throat and trouble swallowing.   Eyes:  Negative for eye problems.  Respiratory:  Positive for cough. Negative for shortness of breath.   Cardiovascular:  Negative for chest pain, leg swelling and palpitations.  Gastrointestinal:  Positive for diarrhea. Negative for abdominal pain, constipation, nausea and vomiting.  Genitourinary:  Negative for bladder incontinence, difficulty  urinating, dysuria, frequency, hematuria and nocturia.   Musculoskeletal:  Positive for arthralgias (in bilateral knees, 5/10 severity) and back pain (5/10 severity). Negative for flank pain, myalgias and neck pain.  Skin:  Negative for itching and rash.  Neurological:  Positive for numbness (in fingers). Negative for dizziness and headaches.  Hematological:  Does not bruise/bleed easily.  Psychiatric/Behavioral:  Positive for sleep disturbance. Negative for depression and suicidal ideas. The patient is not nervous/anxious.   All other systems reviewed and are negative.    VITALS:   Blood pressure (!) 127/49, pulse 67, temperature 97.7 F (36.5 C), temperature source Oral, resp. rate 16, weight 200 lb (90.7 kg), SpO2 98%.  Wt Readings from Last 3 Encounters:  02/13/23 200 lb (90.7 kg)  02/12/23 200 lb (90.7 kg)  01/01/23 204 lb 4.8 oz (92.7 kg)    Body mass index is 30.86 kg/m.  Performance status (ECOG): 1 - Symptomatic but completely ambulatory  PHYSICAL EXAM:   Physical Exam Vitals and nursing note reviewed. Exam conducted with a chaperone present.  Constitutional:      Appearance: Normal appearance.  Cardiovascular:     Rate and Rhythm: Normal rate and regular rhythm.     Pulses: Normal pulses.     Heart sounds: Normal heart sounds.  Pulmonary:     Effort: Pulmonary effort is  normal.     Breath sounds: Normal breath sounds.  Abdominal:     Palpations: Abdomen is soft. There is no hepatomegaly, splenomegaly or mass.     Tenderness: There is no abdominal tenderness.  Musculoskeletal:     Right lower leg: No edema.     Left lower leg: No edema.  Lymphadenopathy:     Cervical: No cervical adenopathy.     Right cervical: No superficial, deep or posterior cervical adenopathy.    Left cervical: No superficial, deep or posterior cervical adenopathy.     Upper Body:     Right upper body: No supraclavicular or axillary adenopathy.     Left upper body: No supraclavicular or  axillary adenopathy.  Neurological:     General: No focal deficit present.     Mental Status: He is alert and oriented to person, place, and time.  Psychiatric:        Mood and Affect: Mood normal.        Behavior: Behavior normal.     LABS:      Latest Ref Rng & Units 02/13/2023    8:43 AM 01/23/2023   12:34 PM 01/01/2023   12:31 PM  CBC  WBC 4.0 - 10.5 K/uL 2.6  7.6  7.2   Hemoglobin 13.0 - 17.0 g/dL 8.2  7.4  8.3   Hematocrit 39.0 - 52.0 % 24.6  22.5  25.2   Platelets 150 - 400 K/uL 60  185  306       Latest Ref Rng & Units 02/13/2023    8:43 AM 01/23/2023   12:34 PM 01/01/2023   12:31 PM  CMP  Glucose 70 - 99 mg/dL 865  784  696   BUN 8 - 23 mg/dL 21  23  23    Creatinine 0.61 - 1.24 mg/dL 2.95  2.84  1.32   Sodium 135 - 145 mmol/L 130  128  134   Potassium 3.5 - 5.1 mmol/L 3.1  3.3  3.8   Chloride 98 - 111 mmol/L 97  95  103   CO2 22 - 32 mmol/L 22  22  23    Calcium 8.9 - 10.3 mg/dL 8.2  7.9  8.6   Total Protein 6.5 - 8.1 g/dL 7.6  7.7  7.5   Total Bilirubin 0.3 - 1.2 mg/dL 0.4  0.7  0.5   Alkaline Phos 38 - 126 U/L 44  38  44   AST 15 - 41 U/L 17  14  16    ALT 0 - 44 U/L 13  13  11       No results found for: "CEA1", "CEA" / No results found for: "CEA1", "CEA" No results found for: "PSA1" No results found for: "CAN199" No results found for: "CAN125"  Lab Results  Component Value Date   TOTALPROTELP 7.2 10/04/2022   ALBUMINELP 4.0 08/16/2022   A1GS 0.2 08/16/2022   A2GS 0.7 08/16/2022   BETS 1.0 08/16/2022   GAMS 1.3 08/16/2022   MSPIKE Not Observed 08/16/2022   SPEI Comment 08/16/2022   Lab Results  Component Value Date   TIBC 252 02/13/2023   TIBC 231 (L) 12/11/2022   TIBC 241 (L) 11/06/2022   FERRITIN 389 (H) 02/13/2023   FERRITIN 121 12/11/2022   FERRITIN 88 11/06/2022   IRONPCTSAT 50 (H) 02/13/2023   IRONPCTSAT 65 (H) 12/11/2022   IRONPCTSAT 66 (H) 11/06/2022   Lab Results  Component Value Date   LDH 101 02/13/2023   LDH 135 01/23/2023  LDH 128 01/01/2023     STUDIES:   No results found.

## 2023-02-12 NOTE — Assessment & Plan Note (Signed)
Doing better after procedures Still very limited

## 2023-02-13 ENCOUNTER — Ambulatory Visit: Payer: Medicare PPO

## 2023-02-13 ENCOUNTER — Inpatient Hospital Stay: Payer: Medicare PPO | Attending: Hematology

## 2023-02-13 ENCOUNTER — Inpatient Hospital Stay: Payer: Medicare PPO | Admitting: Hematology

## 2023-02-13 VITALS — BP 127/49 | HR 67 | Temp 97.7°F | Resp 16 | Wt 200.0 lb

## 2023-02-13 DIAGNOSIS — Z87891 Personal history of nicotine dependence: Secondary | ICD-10-CM | POA: Insufficient documentation

## 2023-02-13 DIAGNOSIS — Z7961 Long term (current) use of immunomodulator: Secondary | ICD-10-CM | POA: Insufficient documentation

## 2023-02-13 DIAGNOSIS — I739 Peripheral vascular disease, unspecified: Secondary | ICD-10-CM | POA: Insufficient documentation

## 2023-02-13 DIAGNOSIS — E876 Hypokalemia: Secondary | ICD-10-CM | POA: Insufficient documentation

## 2023-02-13 DIAGNOSIS — C9200 Acute myeloblastic leukemia, not having achieved remission: Secondary | ICD-10-CM | POA: Insufficient documentation

## 2023-02-13 DIAGNOSIS — E785 Hyperlipidemia, unspecified: Secondary | ICD-10-CM | POA: Diagnosis not present

## 2023-02-13 DIAGNOSIS — D649 Anemia, unspecified: Secondary | ICD-10-CM

## 2023-02-13 DIAGNOSIS — K219 Gastro-esophageal reflux disease without esophagitis: Secondary | ICD-10-CM | POA: Insufficient documentation

## 2023-02-13 DIAGNOSIS — K5900 Constipation, unspecified: Secondary | ICD-10-CM | POA: Insufficient documentation

## 2023-02-13 DIAGNOSIS — D469 Myelodysplastic syndrome, unspecified: Secondary | ICD-10-CM | POA: Diagnosis not present

## 2023-02-13 DIAGNOSIS — Z7982 Long term (current) use of aspirin: Secondary | ICD-10-CM | POA: Insufficient documentation

## 2023-02-13 DIAGNOSIS — K5730 Diverticulosis of large intestine without perforation or abscess without bleeding: Secondary | ICD-10-CM | POA: Insufficient documentation

## 2023-02-13 DIAGNOSIS — E538 Deficiency of other specified B group vitamins: Secondary | ICD-10-CM | POA: Insufficient documentation

## 2023-02-13 DIAGNOSIS — R5383 Other fatigue: Secondary | ICD-10-CM | POA: Insufficient documentation

## 2023-02-13 DIAGNOSIS — Z7902 Long term (current) use of antithrombotics/antiplatelets: Secondary | ICD-10-CM | POA: Insufficient documentation

## 2023-02-13 DIAGNOSIS — Z860101 Personal history of adenomatous and serrated colon polyps: Secondary | ICD-10-CM | POA: Diagnosis not present

## 2023-02-13 DIAGNOSIS — Z79899 Other long term (current) drug therapy: Secondary | ICD-10-CM | POA: Diagnosis not present

## 2023-02-13 DIAGNOSIS — R7989 Other specified abnormal findings of blood chemistry: Secondary | ICD-10-CM | POA: Insufficient documentation

## 2023-02-13 DIAGNOSIS — Z801 Family history of malignant neoplasm of trachea, bronchus and lung: Secondary | ICD-10-CM | POA: Diagnosis not present

## 2023-02-13 DIAGNOSIS — I10 Essential (primary) hypertension: Secondary | ICD-10-CM | POA: Insufficient documentation

## 2023-02-13 DIAGNOSIS — M1990 Unspecified osteoarthritis, unspecified site: Secondary | ICD-10-CM | POA: Insufficient documentation

## 2023-02-13 DIAGNOSIS — I2510 Atherosclerotic heart disease of native coronary artery without angina pectoris: Secondary | ICD-10-CM | POA: Diagnosis not present

## 2023-02-13 LAB — CBC WITH DIFFERENTIAL/PLATELET
Abs Immature Granulocytes: 0 10*3/uL (ref 0.00–0.07)
Band Neutrophils: 2 %
Basophils Absolute: 0 10*3/uL (ref 0.0–0.1)
Basophils Relative: 0 %
Eosinophils Absolute: 0.1 10*3/uL (ref 0.0–0.5)
Eosinophils Relative: 5 %
HCT: 24.6 % — ABNORMAL LOW (ref 39.0–52.0)
Hemoglobin: 8.2 g/dL — ABNORMAL LOW (ref 13.0–17.0)
Lymphocytes Relative: 51 %
Lymphs Abs: 1.3 10*3/uL (ref 0.7–4.0)
MCH: 36.8 pg — ABNORMAL HIGH (ref 26.0–34.0)
MCHC: 33.3 g/dL (ref 30.0–36.0)
MCV: 110.3 fL — ABNORMAL HIGH (ref 80.0–100.0)
Monocytes Absolute: 0.1 10*3/uL (ref 0.1–1.0)
Monocytes Relative: 4 %
Neutro Abs: 1 10*3/uL — ABNORMAL LOW (ref 1.7–7.7)
Neutrophils Relative %: 38 %
Platelets: 60 10*3/uL — ABNORMAL LOW (ref 150–400)
RBC: 2.23 MIL/uL — ABNORMAL LOW (ref 4.22–5.81)
RDW: 19.8 % — ABNORMAL HIGH (ref 11.5–15.5)
WBC: 2.6 10*3/uL — ABNORMAL LOW (ref 4.0–10.5)
nRBC: 0 % (ref 0.0–0.2)

## 2023-02-13 LAB — COMPREHENSIVE METABOLIC PANEL
ALT: 13 U/L (ref 0–44)
AST: 17 U/L (ref 15–41)
Albumin: 3.4 g/dL — ABNORMAL LOW (ref 3.5–5.0)
Alkaline Phosphatase: 44 U/L (ref 38–126)
Anion gap: 11 (ref 5–15)
BUN: 21 mg/dL (ref 8–23)
CO2: 22 mmol/L (ref 22–32)
Calcium: 8.2 mg/dL — ABNORMAL LOW (ref 8.9–10.3)
Chloride: 97 mmol/L — ABNORMAL LOW (ref 98–111)
Creatinine, Ser: 1.5 mg/dL — ABNORMAL HIGH (ref 0.61–1.24)
GFR, Estimated: 47 mL/min — ABNORMAL LOW (ref >60)
Glucose, Bld: 108 mg/dL — ABNORMAL HIGH (ref 70–99)
Potassium: 3.1 mmol/L — ABNORMAL LOW (ref 3.5–5.1)
Sodium: 130 mmol/L — ABNORMAL LOW (ref 135–145)
Total Bilirubin: 0.4 mg/dL (ref 0.3–1.2)
Total Protein: 7.6 g/dL (ref 6.5–8.1)

## 2023-02-13 LAB — IRON AND TIBC
Iron: 126 ug/dL (ref 45–182)
Saturation Ratios: 50 % — ABNORMAL HIGH (ref 17.9–39.5)
TIBC: 252 ug/dL (ref 250–450)
UIBC: 126 ug/dL

## 2023-02-13 LAB — LACTATE DEHYDROGENASE: LDH: 101 U/L (ref 98–192)

## 2023-02-13 LAB — SAMPLE TO BLOOD BANK

## 2023-02-13 LAB — FERRITIN: Ferritin: 389 ng/mL — ABNORMAL HIGH (ref 24–336)

## 2023-02-13 MED ORDER — POTASSIUM CHLORIDE CRYS ER 20 MEQ PO TBCR
20.0000 meq | EXTENDED_RELEASE_TABLET | Freq: Once | ORAL | Status: AC
  Administered 2023-02-13: 20 meq via ORAL

## 2023-02-13 NOTE — Patient Instructions (Signed)
[facility] at [facility] Discharge Instructions   You were seen and examined today by Dr. Ellin Saba.  He reviewed the results of your lab work which are mostly normal/stable. Your platelets are low and your white count is low. Dr. Kirtland Bouchard would like to reduce the dose of your Revlimid. We will send a prescription for 5 mg daily. Do not take any more Revlimid pills until you get the new prescription  We will see you back in 3 weeks. We will repeat lab work at that time.   Return as scheduled.    Thank you for choosing [facility] at [facility] to provide your oncology and hematology care.  To afford each patient quality time with our provider, please arrive at least 15 minutes before your scheduled appointment time.   If you have a lab appointment with the Cancer Center please come in thru the Main Entrance and check in at the main information desk.  You need to re-schedule your appointment should you arrive 10 or more minutes late.  We strive to give you quality time with our providers, and arriving late affects you and other patients whose appointments are after yours.  Also, if you no show three or more times for appointments you may be dismissed from the clinic at the providers discretion.     Again, thank you for choosing [facility].  Our hope is that these requests will decrease the amount of time that you wait before being seen by our physicians.       _____________________________________________________________  Should you have questions after your visit to [facility], please contact our office at [phone] and follow the prompts.  Our office hours are 8:00 a.m. and 4:30 p.m. Monday - Friday.  Please note that voicemails left after 4:00 p.m. may not be returned until the following business day.  We are closed weekends and major holidays.  You do have access to a nurse 24-7, just call the main number to the clinic  [phone] and do not press any options, hold on the line and a nurse will answer the phone.    For prescription refill requests, have your pharmacy contact our office and allow 72 hours.    Due to Covid, you will need to wear a mask upon entering the hospital. If you do not have a mask, a mask will be given to you at the Main Entrance upon arrival. For doctor visits, patients may have 1 support person age 31 or older with them. For treatment visits, patients can not have anyone with them due to social distancing guidelines and our immunocompromised population.

## 2023-02-13 NOTE — Progress Notes (Signed)
Patient is taking Revlimid as prescribed.  He has not missed any doses and reports no side effects at this time.   

## 2023-02-18 ENCOUNTER — Other Ambulatory Visit: Payer: Self-pay | Admitting: Hematology

## 2023-02-18 DIAGNOSIS — D469 Myelodysplastic syndrome, unspecified: Secondary | ICD-10-CM

## 2023-02-19 ENCOUNTER — Other Ambulatory Visit: Payer: Self-pay

## 2023-02-19 ENCOUNTER — Other Ambulatory Visit: Payer: Self-pay | Admitting: *Deleted

## 2023-02-19 DIAGNOSIS — D469 Myelodysplastic syndrome, unspecified: Secondary | ICD-10-CM

## 2023-02-19 MED ORDER — LENALIDOMIDE 5 MG PO CAPS: 10.0000 mg | ORAL_CAPSULE | Freq: Every day | ORAL | 1 refills | Status: DC

## 2023-02-19 MED ORDER — LENALIDOMIDE 5 MG PO CAPS: 5.0000 mg | ORAL_CAPSULE | Freq: Every day | ORAL | 0 refills | Status: DC

## 2023-02-19 NOTE — Telephone Encounter (Signed)
Chart reviewed. Revlimid refilled at new dosage per last office note with Dr. Ellin Saba.

## 2023-03-08 ENCOUNTER — Inpatient Hospital Stay: Payer: Medicare PPO

## 2023-03-08 ENCOUNTER — Inpatient Hospital Stay: Payer: Medicare PPO | Admitting: Hematology

## 2023-03-08 VITALS — BP 125/61 | HR 66 | Temp 98.2°F | Resp 18 | Wt 198.8 lb

## 2023-03-08 DIAGNOSIS — E876 Hypokalemia: Secondary | ICD-10-CM | POA: Diagnosis not present

## 2023-03-08 DIAGNOSIS — D469 Myelodysplastic syndrome, unspecified: Secondary | ICD-10-CM | POA: Diagnosis not present

## 2023-03-08 DIAGNOSIS — M1990 Unspecified osteoarthritis, unspecified site: Secondary | ICD-10-CM | POA: Diagnosis not present

## 2023-03-08 DIAGNOSIS — E538 Deficiency of other specified B group vitamins: Secondary | ICD-10-CM | POA: Diagnosis not present

## 2023-03-08 DIAGNOSIS — K5730 Diverticulosis of large intestine without perforation or abscess without bleeding: Secondary | ICD-10-CM | POA: Diagnosis not present

## 2023-03-08 DIAGNOSIS — C9200 Acute myeloblastic leukemia, not having achieved remission: Secondary | ICD-10-CM | POA: Diagnosis not present

## 2023-03-08 DIAGNOSIS — K5900 Constipation, unspecified: Secondary | ICD-10-CM | POA: Diagnosis not present

## 2023-03-08 DIAGNOSIS — R5383 Other fatigue: Secondary | ICD-10-CM | POA: Diagnosis not present

## 2023-03-08 DIAGNOSIS — R7989 Other specified abnormal findings of blood chemistry: Secondary | ICD-10-CM | POA: Diagnosis not present

## 2023-03-08 LAB — CBC WITH DIFFERENTIAL/PLATELET
Abs Immature Granulocytes: 0.01 10*3/uL (ref 0.00–0.07)
Basophils Absolute: 0 10*3/uL (ref 0.0–0.1)
Basophils Relative: 0 %
Eosinophils Absolute: 0.1 10*3/uL (ref 0.0–0.5)
Eosinophils Relative: 5 %
HCT: 24.2 % — ABNORMAL LOW (ref 39.0–52.0)
Hemoglobin: 8.2 g/dL — ABNORMAL LOW (ref 13.0–17.0)
Immature Granulocytes: 0 %
Lymphocytes Relative: 40 %
Lymphs Abs: 1.2 10*3/uL (ref 0.7–4.0)
MCH: 38.5 pg — ABNORMAL HIGH (ref 26.0–34.0)
MCHC: 33.9 g/dL (ref 30.0–36.0)
MCV: 113.6 fL — ABNORMAL HIGH (ref 80.0–100.0)
Monocytes Absolute: 0.4 10*3/uL (ref 0.1–1.0)
Monocytes Relative: 13 %
Neutro Abs: 1.2 10*3/uL — ABNORMAL LOW (ref 1.7–7.7)
Neutrophils Relative %: 42 %
Platelets: 159 10*3/uL (ref 150–400)
RBC: 2.13 MIL/uL — ABNORMAL LOW (ref 4.22–5.81)
RDW: 19.2 % — ABNORMAL HIGH (ref 11.5–15.5)
WBC: 2.9 10*3/uL — ABNORMAL LOW (ref 4.0–10.5)
nRBC: 0 % (ref 0.0–0.2)

## 2023-03-08 LAB — COMPREHENSIVE METABOLIC PANEL
ALT: 15 U/L (ref 0–44)
AST: 18 U/L (ref 15–41)
Albumin: 3.5 g/dL (ref 3.5–5.0)
Alkaline Phosphatase: 40 U/L (ref 38–126)
Anion gap: 9 (ref 5–15)
BUN: 22 mg/dL (ref 8–23)
CO2: 24 mmol/L (ref 22–32)
Calcium: 8.7 mg/dL — ABNORMAL LOW (ref 8.9–10.3)
Chloride: 98 mmol/L (ref 98–111)
Creatinine, Ser: 1.37 mg/dL — ABNORMAL HIGH (ref 0.61–1.24)
GFR, Estimated: 53 mL/min — ABNORMAL LOW (ref >60)
Glucose, Bld: 105 mg/dL — ABNORMAL HIGH (ref 70–99)
Potassium: 3.2 mmol/L — ABNORMAL LOW (ref 3.5–5.1)
Sodium: 131 mmol/L — ABNORMAL LOW (ref 135–145)
Total Bilirubin: 0.5 mg/dL (ref 0.3–1.2)
Total Protein: 7.4 g/dL (ref 6.5–8.1)

## 2023-03-08 LAB — LACTATE DEHYDROGENASE: LDH: 107 U/L (ref 98–192)

## 2023-03-08 NOTE — Patient Instructions (Addendum)
[facility] at [facility] Discharge Instructions   You were seen and examined today by Dr. Ellin Saba.  He reviewed the results of your lab work which are stable or improved.   Continue Revlimid as prescribed.   We will see you back in 6 weeks. We will recheck your blood work in 3 weeks and in 6 weeks.  Return as scheduled.    Thank you for choosing [facility] at [facility] to provide your oncology and hematology care.  To afford each patient quality time with our provider, please arrive at least 15 minutes before your scheduled appointment time.   If you have a lab appointment with the Cancer Center please come in thru the Main Entrance and check in at the main information desk.  You need to re-schedule your appointment should you arrive 10 or more minutes late.  We strive to give you quality time with our providers, and arriving late affects you and other patients whose appointments are after yours.  Also, if you no show three or more times for appointments you may be dismissed from the clinic at the providers discretion.     Again, thank you for choosing [facility].  Our hope is that these requests will decrease the amount of time that you wait before being seen by our physicians.       _____________________________________________________________  Should you have questions after your visit to [facility], please contact our office at [phone] and follow the prompts.  Our office hours are 8:00 a.m. and 4:30 p.m. Monday - Friday.  Please note that voicemails left after 4:00 p.m. may not be returned until the following business day.  We are closed weekends and major holidays.  You do have access to a nurse 24-7, just call the main number to the clinic [phone] and do not press any options, hold on the line and a nurse will answer the phone.    For prescription refill requests, have your pharmacy contact our  office and allow 72 hours.    Due to Covid, you will need to wear a mask upon entering the hospital. If you do not have a mask, a mask will be given to you at the Main Entrance upon arrival. For doctor visits, patients may have 1 support person age 26 or older with them. For treatment visits, patients can not have anyone with them due to social distancing guidelines and our immunocompromised population.

## 2023-03-08 NOTE — Progress Notes (Signed)
[facility] [address], [place] [address]    Clinic Day:  03/08/2023  Referring physician: Karie Schwalbe, MD  Patient Care Team: Karie Schwalbe, MD as PCP - General (Internal Medicine) Wyline Mood Dorothe Pea, MD as PCP - Cardiology (Cardiology) Aaron Massed, MD as Medical Oncologist (Hematology)   ASSESSMENT & PLAN:   Assessment: 1.  Hereditary hemochromatosis: -Patient seen for elevated ferritin levels. -EMR evaluation shows hemochromatosis testing on 01/27/2019 which showed heterozygosity for C282Y and H63D variants. -Given the elevated ferritin levels above 1000 and the results of genetic testing, this is compatible with hereditary hemochromatosis. -Patient does have arthritis of the small joints of the hands and arthritis of the back. -No history of CHF but has CAD.  No diabetes. -MRI of the liver on 12/11/2019 shows estimated liver iron concentration approximately 7.7 mg/g compatible with moderate liver iron.  Estimated fat fraction ranges from 7-12%, more pronounced in the right lobe compared to left hepatic lobe.  No focal hepatic lesions seen.  No morphological changes of significant liver disease. -Jadenu 540 mg daily started on 12/24/2019, dose increased to 720 mg daily on 02/09/2020.  - MRI liver (09/21/2021): Diminished liver iron concentration less than 2 mg/g, slightly below "mild" designation (previously well into the "moderate" range). - Dose decreased to 2 tablets daily on 07/04/2022   2.  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 Myrtie Neither on 02/17/2019 shows diverticulosis in the left colon, 12 mm polyp in the cecum, 4 to 6 mm polyp in the ascending colon and 5 mm polyp in the descending colon.  Pathology was consistent with tubular adenomas. - Colonoscopy (11/30/2022): 8 mm polyp in the transverse colon.  Multiple diverticula found in the left colon.  Internal hemorrhoids were found.  Exam was otherwise normal. - EGD  (11/30/2022): Normal esophagus.  Multiple erosions with no bleeding and no stigmata of recent bleeding in the prepyloric region of the stomach. - Serum EPO level 54.4 - NGS: SF3B1 (variant frequency 31%) - Bone marrow biopsy: Hypercellular marrow with dyspoietic changes primarily involving erythroid series associated with ring sideroblasts.  No increase in blasts.  Findings consistent with low-grade MDS with ring sideroblasts (More than 15% of erythroid precursors) - Karyotype: 46, XY,del(20)(q11.2q13.3)[3]/46,XY[17] - MDS FISH: Deletion 5 q. and deletion 20 q. - IPSS-R: Score 2.5 points, low risk MDS - IPSS-M: Score 0.83.  Leukemia free survival: 5.9 years, 1.7% by 1 year and 5.1% by 4 years AML transformation. 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 - Revlimid 10 mg daily started on 01/05/2023, held on 02/12/2023 due to thrombocytopenia and leukopenia.  Revlimid 5 mg daily started on 02/22/2023.  Plan: 1.  Hereditary hemochromatosis: - [name] was held since 09/13/2022 due to severe anemia. - Latest ferritin is 389 with percent saturation of 50 on 02/13/2023, partly transfusion induced. - I would not start him on Jadenu yet.   2.  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  This was due to thrombocytopenia and leukopenia. - He started Revlimid 5 mg daily on 02/22/2023. - He had intermittent diarrhea up to 2 watery stools per day which subsided with 1 tablet of Imodium.  He then develops constipation. - Reviewed labs today, normal LFTs and LDH.  CBC with white count 2.9 and hemoglobin 8.2.  Platelet count improved to normal at 159. - Continue Revlimid 5 mg daily continuous dosing.  Will repeat CBC in 2 weeks to see if he needs any transfusion.  I will see him back in the clinic in 6 weeks.  3.  Elevated creatinine and hypokalemia: - Mildly elevated creatinine most likely from Revlimid.  It is  improved to 1.37 today from previous 1.5. - He has mild hypokalemia with potassium 3.2.  He was told to eat a fruit every day.      No orders of the defined types were placed in this encounter.     Aaron Sexton,acting as a Neurosurgeon for Aaron Massed, MD.,have documented all relevant documentation on the behalf of Aaron Massed, MD,as directed by  Aaron Massed, MD while in the presence of Aaron Massed, MD.  I, Aaron Massed MD, have reviewed the above documentation for accuracy and completeness, and I agree with the above.      Aaron Massed, MD   10/31/202412:51 PM  CHIEF COMPLAINT:   Diagnosis: hereditary hemochromatosis and low risk MDS   Cancer Staging  No matching staging information was found for the patient.    Prior Therapy: Jadenu  Current Therapy: Revlimid 5 mg daily   HISTORY OF PRESENT ILLNESS:   Oncology History   No history exists.     INTERVAL HISTORY:   Aaron Sexton is a 77 y.o. male presenting to clinic today for follow up of hereditary hemochromatosis. He was last seen by me on 02/13/23.  Today, he states that he is doing well overall. His appetite level is at 50%. His energy level is at 80%. He is accompanied by his wife.   He c/o alternating constipation and diarrhea. He treats diarrhea with Imodium that causes constipation. Diarrhea occurs 2-3x a day with watery stools. Diarrhea is worsened with certain foods, such as salad. He started Revlimid on 02/22/23.   PAST MEDICAL HISTORY:   Past Medical History: 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   Cerebrovascular disease    Moderate ASVD without focal stenosis in 01/2007   Clotting disorder (HCC)    Colon polyps 07/2010   single 2mm polyp--tubular adenoma   Diverticulosis 2012   found on colonoscopy   Erectile dysfunction    Family history of  adverse reaction to anesthesia    difficult for son & sistor to wake    GERD (gastroesophageal reflux disease)    Hematochezia 05/24/2010   Hyperlipidemia    Hypertension    Osteoarthritis    knees/hands-Dr Lucey   Overweight(278.02)    Peripheral vascular disease (HCC) 03/11/2010   Moderate SFA stenosis; history of claudication   Pneumonia    Rosacea    Vitamin B12 deficiency     Surgical History: Past Surgical History:  Procedure Laterality Date   APPENDECTOMY     APPLICATION OF WOUND VAC Left 12/17/2021   Procedure: APPLICATION OF PREVENA WOUND VAC LEFT GROIN;  Surgeon: Victorino Sparrow, MD;  Location: [facility] OR;  Service: Vascular;  Laterality: Left;   APPLICATION OF WOUND VAC Left 01/08/2022   Procedure: APPLICATION OF WOUND VAC left groin.;  Surgeon: Cephus Shelling, MD;  Location: [facility] OR;  Service: Vascular;  Laterality: Left;   COLONOSCOPY W/ POLYPECTOMY  2012   ENDARTERECTOMY FEMORAL Left 12/09/2021   Procedure: LEFT COMMON FEMORAL ENDARTERECTOMY WITH 1 CM X 6 CM XENOSURE BOVINE PATCH ANGIOPLASTY;  Surgeon: Cephus Shelling, MD;  Location: [facility] OR;  Service: Vascular;  Laterality:  Left;   FEMORAL-POPLITEAL BYPASS GRAFT Left 12/09/2021   Procedure: LEFT FEMORAL- ABOVE KNEE POPLITEAL BYPASS WITH 6 mm x 80 cm PROPATEN GRAFT;  Surgeon: Cephus Shelling, MD;  Location: [facility] OR;  Service: Vascular;  Laterality: Left;  INSERT ARTERIAL LINE   ILIAC VEIN ANGIOPLASTY / STENTING Right 08/09/2015   INCISION AND DRAINAGE OF WOUND Left 01/08/2022   Procedure: IRRIGATION AND DEBRIDEMENT GROIN LEFT application of Myriad Morcells.;  Surgeon: Cephus Shelling, MD;  Location: [facility] OR;  Service: Vascular;  Laterality: Left;   INSERTION OF ILIAC STENT  12/09/2021   Procedure: INSERTION OF LEFT COMMON AND EXTERNAL  ILIAC STENTS WITH ANGIOPLASTY;  Surgeon: Cephus Shelling, MD;  Location: [facility] OR;  Service: Vascular;;   LOWER EXTREMITY ANGIOGRAM Left 12/09/2021   Procedure: LEFT ILIAC AND LOWER  EXTREMITY ANGIOGRAM;  Surgeon: Cephus Shelling, MD;  Location: [facility] OR;  Service: Vascular;  Laterality: Left;   LOWER EXTREMITY ANGIOGRAPHY Bilateral 11/03/2021   Procedure: Lower Extremity Angiography;  Surgeon: Runell Gess, MD;  Location: [facility] INVASIVE CV LAB;  Service: Cardiovascular;  Laterality: Bilateral;  Limited Study   PERIPHERAL VASCULAR CATHETERIZATION Bilateral 06/21/2015   Procedure: Lower Extremity Angiography;  Surgeon: Runell Gess, MD;  Location: [facility] INVASIVE CV LAB;  Service: Cardiovascular;  Laterality: Bilateral;   PERIPHERAL VASCULAR CATHETERIZATION N/A 06/21/2015   Procedure: Abdominal Aortogram;  Surgeon: Runell Gess, MD;  Location: [facility] INVASIVE CV LAB;  Service: Cardiovascular;  Laterality: N/A;   PERIPHERAL VASCULAR CATHETERIZATION N/A 07/26/2015   Procedure: Lower Extremity Angiography;  Surgeon: Runell Gess, MD;  Location: [facility] INVASIVE CV LAB;  Service: Cardiovascular;  Laterality: N/A;   PERIPHERAL VASCULAR CATHETERIZATION N/A 08/09/2015   Procedure: Lower Extremity Angiography;  Surgeon: Runell Gess, MD;  Location: [facility] INVASIVE CV LAB;  Service: Cardiovascular;  Laterality: N/A;   PERIPHERAL VASCULAR CATHETERIZATION  08/09/2015   Procedure: Peripheral Vascular Intervention;  Surgeon: Runell Gess, MD;  Location: [facility] INVASIVE CV LAB;  Service: Cardiovascular;;  rt ext. iliac atherectomy and stent   PERIPHERAL VASCULAR CATHETERIZATION N/A 09/20/2015   Procedure: Lower Extremity Angiography;  Surgeon: Runell Gess, MD;  Location: [facility] INVASIVE CV LAB;  Service: Cardiovascular;  Laterality: N/A;   PERIPHERAL VASCULAR CATHETERIZATION Right 09/20/2015   Procedure: Peripheral Vascular Intervention;  Surgeon: Runell Gess, MD;  Location: [facility] INVASIVE CV LAB;  Service: Cardiovascular;  Laterality: Right;  SFA   ROTATOR CUFF REPAIR Left 7/15   Dr Sherlean Foot   WOUND EXPLORATION Left 12/17/2021   Procedure: LEFT LEG WOUND EXPLORATION AND WASH OUT WITH MYRIAD  MORCELLS;  Surgeon: Victorino Sparrow, MD;  Location: [facility] OR;  Service: Vascular;  Laterality: Left;    Social History: Social History   Socioeconomic History   Marital status: Married    Spouse name: Not on file   Number of children: 3   Years of education: Not on file   Highest education level: Not on file  Occupational History   Occupation: Managed supply chain--- retired 2013    Comment: [employer]  Tobacco Use   Smoking status: Former    Current packs/day: 0.00    Average packs/day: 1 pack/day for 30.0 years (30.0 ttl pk-yrs)    Types: Cigarettes    Start date: 05/16/1960    Quit date: 05/16/1990    Years since quitting: 32.8    Passive exposure: Never   Smokeless tobacco: Never   Tobacco comments:    Quit in 1993  Vaping  Use   Vaping status: Never Used  Substance and Sexual Activity   Alcohol use: No    Alcohol/week: 0.0 standard drinks of alcohol   Drug use: No   Sexual activity: Not on file  Other Topics Concern   Not on file  Social History Narrative   Has living will   Wife is health care POA---alternate is son or daughter   Would accept resuscitation but no prolonged artificial ventilation.   No tube feeds if cognitively unaware   Social Determinants of Health   Financial Resource Strain: Low Risk  (03/10/2020)   Overall Financial Resource Strain (CARDIA)    Difficulty of Paying Living Expenses: Not hard at all  Food Insecurity: No Food Insecurity (01/13/2022)   Hunger Vital Sign    Worried About Running Out of Food in the Last Year: Never true    Ran Out of Food in the Last Year: Never true  Transportation Needs: No Transportation Needs (01/13/2022)   PRAPARE - Administrator, Civil Service (Medical): No    Lack of Transportation (Non-Medical): No  Physical Activity: Inactive (03/10/2020)   Exercise Vital Sign    Days of Exercise per Week: 0 days    Minutes of Exercise per Session: 0 min  Stress: No Stress Concern Present  (03/10/2020)   [employer] of Occupational Health - Occupational Stress Questionnaire    Feeling of Stress : Not at all  Social Connections: Moderately Integrated (03/10/2020)   Social Connection and Isolation Panel [NHANES]    Frequency of Communication with Friends and Family: More than three times a week    Frequency of Social Gatherings with Friends and Family: Never    Attends Religious Services: More than 4 times per year    Active Member of [employer] or Organizations: No    Attends Banker Meetings: Never    Marital Status: Married  Catering manager Violence: Not At Risk (03/10/2020)   Humiliation, Afraid, Rape, and Kick questionnaire    Fear of Current or Ex-Partner: No    Emotionally Abused: No    Physically Abused: No    Sexually Abused: No    Family History: Family History  Problem Relation Age of Onset   Lung cancer Mother    Diabetes Mother    Hypertension Father        And siblings   Heart disease Father        And second-degree relatives   Transient ischemic attack Father    Brain cancer Sister    Heart disease Sister    Heart disease Sister    Thyroid disease Sister    Diabetes Brother    Atrial fibrillation Brother    Ulcers Son    Diabetes Son    Diabetes Son    Colon cancer Neg Hx    Stomach cancer Neg Hx    Rectal cancer Neg Hx     Current Medications:  Current Outpatient Medications:    aspirin 81 MG tablet, Take 81 mg by mouth every evening., Disp: , Rfl:    chlorthalidone (HYGROTON) 25 MG tablet, TAKE 1/2 TABLET(12.5 MG) BY MOUTH DAILY, Disp: 30 tablet, Rfl: 3   clopidogrel (PLAVIX) 75 MG tablet, TAKE 1 TABLET(75 MG) BY MOUTH DAILY WITH BREAKFAST, Disp: 90 tablet, Rfl: 3   Cyanocobalamin (VITAMIN B-12) 500 MCG SUBL, Place 500 mcg under the tongue daily., Disp: , Rfl:    lenalidomide (REVLIMID) 5 MG capsule, Take 1 capsule (5 mg total) by mouth daily.,  Disp: 28 capsule, Rfl: 0   loperamide (IMODIUM A-D) 2 MG tablet, Take 2 mg by  mouth 4 (four) times daily as needed for diarrhea or loose stools., Disp: , Rfl:    losartan (COZAAR) 25 MG tablet, TAKE 1 TABLET(25 MG) BY MOUTH DAILY, Disp: 90 tablet, Rfl: 3   metoprolol succinate (TOPROL-XL) 100 MG 24 hr tablet, TAKE 1 TABLET(100 MG) BY MOUTH DAILY WITH OR IMMEDIATELY FOLLOWING A MEAL, Disp: 90 tablet, Rfl: 1   nitroGLYCERIN (NITROSTAT) 0.4 MG SL tablet, Place 1 tablet (0.4 mg total) under the tongue every 5 (five) minutes as needed for chest pain., Disp: 25 tablet, Rfl: 1   Omega-3 Fatty Acids (FISH OIL) 1000 MG CAPS, Take 1,000 mg by mouth daily., Disp: , Rfl:    omeprazole (PRILOSEC) 20 MG capsule, Take 20 mg by mouth every other day., Disp: , Rfl:    pravastatin (PRAVACHOL) 40 MG tablet, TAKE 1 TABLET(40 MG) BY MOUTH DAILY, Disp: 90 tablet, Rfl: 3   traMADol (ULTRAM) 50 MG tablet, TAKE 1 TABLET BY MOUTH EVERY 8 HOURS AS NEEDED, Disp: 90 tablet, Rfl: 0   Allergies: Allergies  Allergen Reactions   Cardizem [Diltiazem Hcl] Hives and Rash    REVIEW OF SYSTEMS:   Review of Systems  Constitutional:  Negative for chills, fatigue and fever.  HENT:   Negative for lump/mass, mouth sores, nosebleeds, sore throat and trouble swallowing.   Eyes:  Negative for eye problems.  Respiratory:  Negative for cough and shortness of breath.   Cardiovascular:  Negative for chest pain, leg swelling and palpitations.  Gastrointestinal:  Positive for constipation and diarrhea. Negative for abdominal pain, nausea and vomiting.  Genitourinary:  Negative for bladder incontinence, difficulty urinating, dysuria, frequency, hematuria and nocturia.   Musculoskeletal:  Negative for arthralgias, back pain, flank pain, myalgias and neck pain.  Skin:  Negative for itching and rash.  Neurological:  Negative for dizziness, headaches and numbness.       +tingling fingers and toes  Hematological:  Does not bruise/bleed easily.  Psychiatric/Behavioral:  Negative for depression, sleep disturbance and  suicidal ideas. The patient is not nervous/anxious.   All other systems reviewed and are negative.    VITALS:   Blood pressure 125/61, pulse 66, temperature 98.2 F (36.8 C), temperature source Oral, resp. rate 18, weight 198 lb 12.8 oz (90.2 kg), SpO2 99%.  Wt Readings from Last 3 Encounters:  03/08/23 198 lb 12.8 oz (90.2 kg)  02/13/23 200 lb (90.7 kg)  02/12/23 200 lb (90.7 kg)    Body mass index is 30.68 kg/m.  Performance status (ECOG): 1 - Symptomatic but completely ambulatory  PHYSICAL EXAM:   Physical Exam Vitals and nursing note reviewed. Exam conducted with a chaperone present.  Constitutional:      Appearance: Normal appearance.  Cardiovascular:     Rate and Rhythm: Normal rate and regular rhythm.     Pulses: Normal pulses.     Heart sounds: Normal heart sounds.  Pulmonary:     Effort: Pulmonary effort is normal.     Breath sounds: Normal breath sounds.  Abdominal:     Palpations: Abdomen is soft. There is no hepatomegaly, splenomegaly or mass.     Tenderness: There is no abdominal tenderness.  Musculoskeletal:     Right lower leg: No edema.     Left lower leg: No edema.  Lymphadenopathy:     Cervical: No cervical adenopathy.     Right cervical: No superficial, deep or posterior cervical  adenopathy.    Left cervical: No superficial, deep or posterior cervical adenopathy.     Upper Body:     Right upper body: No supraclavicular or axillary adenopathy.     Left upper body: No supraclavicular or axillary adenopathy.  Neurological:     General: No focal deficit present.     Mental Status: He is alert and oriented to person, place, and time.  Psychiatric:        Mood and Affect: Mood normal.        Behavior: Behavior normal.     LABS:      Latest Ref Rng & Units 03/08/2023   11:15 AM 02/13/2023    8:43 AM 01/23/2023   12:34 PM  CBC  WBC 4.0 - 10.5 K/uL 2.9  2.6  7.6   Hemoglobin 13.0 - 17.0 g/dL 8.2  8.2  7.4   Hematocrit 39.0 - 52.0 % 24.2  24.6   22.5   Platelets 150 - 400 K/uL 159  60  185       Latest Ref Rng & Units 03/08/2023   11:15 AM 02/13/2023    8:43 AM 01/23/2023   12:34 PM  CMP  Glucose 70 - 99 mg/dL 409  811  914   BUN 8 - 23 mg/dL 22  21  23    Creatinine 0.61 - 1.24 mg/dL 7.82  9.56  2.13   Sodium 135 - 145 mmol/L 131  130  128   Potassium 3.5 - 5.1 mmol/L 3.2  3.1  3.3   Chloride 98 - 111 mmol/L 98  97  95   CO2 22 - 32 mmol/L 24  22  22    Calcium 8.9 - 10.3 mg/dL 8.7  8.2  7.9   Total Protein 6.5 - 8.1 g/dL 7.4  7.6  7.7   Total Bilirubin 0.3 - 1.2 mg/dL 0.5  0.4  0.7   Alkaline Phos 38 - 126 U/L 40  44  38   AST 15 - 41 U/L 18  17  14    ALT 0 - 44 U/L 15  13  13       No results found for: "CEA1", "CEA" / No results found for: "CEA1", "CEA" No results found for: "PSA1" No results found for: "CAN199" No results found for: "CAN125"  Lab Results  Component Value Date   TOTALPROTELP 7.2 10/04/2022   ALBUMINELP 4.0 08/16/2022   A1GS 0.2 08/16/2022   A2GS 0.7 08/16/2022   BETS 1.0 08/16/2022   GAMS 1.3 08/16/2022   MSPIKE Not Observed 08/16/2022   SPEI Comment 08/16/2022   Lab Results  Component Value Date   TIBC 252 02/13/2023   TIBC 231 (L) 12/11/2022   TIBC 241 (L) 11/06/2022   FERRITIN 389 (H) 02/13/2023   FERRITIN 121 12/11/2022   FERRITIN 88 11/06/2022   IRONPCTSAT 50 (H) 02/13/2023   IRONPCTSAT 65 (H) 12/11/2022   IRONPCTSAT 66 (H) 11/06/2022   Lab Results  Component Value Date   LDH 107 03/08/2023   LDH 101 02/13/2023   LDH 135 01/23/2023     STUDIES:   No results found.

## 2023-03-08 NOTE — Progress Notes (Signed)
Patient is taking Revlimid as prescribed.  He has not missed any doses and reports no side effects at this time.   

## 2023-03-13 ENCOUNTER — Other Ambulatory Visit: Payer: Self-pay | Admitting: Hematology

## 2023-03-13 DIAGNOSIS — D469 Myelodysplastic syndrome, unspecified: Secondary | ICD-10-CM

## 2023-03-15 ENCOUNTER — Other Ambulatory Visit: Payer: Self-pay | Admitting: Cardiology

## 2023-03-16 ENCOUNTER — Other Ambulatory Visit: Payer: Self-pay

## 2023-03-16 DIAGNOSIS — D469 Myelodysplastic syndrome, unspecified: Secondary | ICD-10-CM

## 2023-03-20 ENCOUNTER — Other Ambulatory Visit: Payer: Self-pay

## 2023-03-20 DIAGNOSIS — D469 Myelodysplastic syndrome, unspecified: Secondary | ICD-10-CM

## 2023-03-20 MED ORDER — LENALIDOMIDE 5 MG PO CAPS: 5.0000 mg | ORAL_CAPSULE | Freq: Every day | ORAL | 0 refills | Status: DC

## 2023-03-20 NOTE — Telephone Encounter (Signed)
Chart reviewed. Revlimid refilled per last office note with Dr. Katragadda.  

## 2023-03-20 NOTE — Progress Notes (Signed)
Lab orders entered

## 2023-03-21 ENCOUNTER — Inpatient Hospital Stay: Payer: Medicare PPO | Attending: Hematology

## 2023-03-21 DIAGNOSIS — D469 Myelodysplastic syndrome, unspecified: Secondary | ICD-10-CM

## 2023-03-21 LAB — CBC WITH DIFFERENTIAL/PLATELET
Abs Immature Granulocytes: 0.02 10*3/uL (ref 0.00–0.07)
Basophils Absolute: 0 10*3/uL (ref 0.0–0.1)
Basophils Relative: 1 %
Eosinophils Absolute: 0.1 10*3/uL (ref 0.0–0.5)
Eosinophils Relative: 4 %
HCT: 26.4 % — ABNORMAL LOW (ref 39.0–52.0)
Hemoglobin: 8.9 g/dL — ABNORMAL LOW (ref 13.0–17.0)
Immature Granulocytes: 1 %
Lymphocytes Relative: 41 %
Lymphs Abs: 1.5 10*3/uL (ref 0.7–4.0)
MCH: 39 pg — ABNORMAL HIGH (ref 26.0–34.0)
MCHC: 33.7 g/dL (ref 30.0–36.0)
MCV: 115.8 fL — ABNORMAL HIGH (ref 80.0–100.0)
Monocytes Absolute: 0.4 10*3/uL (ref 0.1–1.0)
Monocytes Relative: 12 %
Neutro Abs: 1.6 10*3/uL — ABNORMAL LOW (ref 1.7–7.7)
Neutrophils Relative %: 41 %
Platelets: 181 10*3/uL (ref 150–400)
RBC: 2.28 MIL/uL — ABNORMAL LOW (ref 4.22–5.81)
RDW: 18 % — ABNORMAL HIGH (ref 11.5–15.5)
WBC: 3.8 10*3/uL — ABNORMAL LOW (ref 4.0–10.5)
nRBC: 0 % (ref 0.0–0.2)

## 2023-03-21 LAB — SAMPLE TO BLOOD BANK

## 2023-03-22 ENCOUNTER — Other Ambulatory Visit: Payer: Medicare PPO

## 2023-04-10 ENCOUNTER — Other Ambulatory Visit: Payer: Self-pay | Admitting: Hematology

## 2023-04-10 DIAGNOSIS — D469 Myelodysplastic syndrome, unspecified: Secondary | ICD-10-CM

## 2023-04-16 ENCOUNTER — Other Ambulatory Visit: Payer: Self-pay | Admitting: *Deleted

## 2023-04-16 ENCOUNTER — Other Ambulatory Visit: Payer: Self-pay | Admitting: Internal Medicine

## 2023-04-16 DIAGNOSIS — D469 Myelodysplastic syndrome, unspecified: Secondary | ICD-10-CM

## 2023-04-16 MED ORDER — LENALIDOMIDE 5 MG PO CAPS: 5.0000 mg | ORAL_CAPSULE | Freq: Every day | ORAL | 0 refills | Status: DC

## 2023-04-16 NOTE — Telephone Encounter (Signed)
Last filled 01-17-23 #90 Last OV 02-12-23 No Future OV [facility] [address]

## 2023-04-24 ENCOUNTER — Other Ambulatory Visit: Payer: Self-pay

## 2023-04-24 DIAGNOSIS — D469 Myelodysplastic syndrome, unspecified: Secondary | ICD-10-CM

## 2023-04-25 ENCOUNTER — Inpatient Hospital Stay: Payer: Medicare PPO | Attending: Hematology | Admitting: Hematology

## 2023-04-25 ENCOUNTER — Inpatient Hospital Stay: Payer: Medicare PPO

## 2023-04-25 VITALS — BP 133/55 | HR 71 | Temp 99.4°F | Resp 18 | Wt 195.0 lb

## 2023-04-25 DIAGNOSIS — D469 Myelodysplastic syndrome, unspecified: Secondary | ICD-10-CM | POA: Diagnosis not present

## 2023-04-25 DIAGNOSIS — R7989 Other specified abnormal findings of blood chemistry: Secondary | ICD-10-CM | POA: Insufficient documentation

## 2023-04-25 DIAGNOSIS — D461 Refractory anemia with ring sideroblasts: Secondary | ICD-10-CM | POA: Insufficient documentation

## 2023-04-25 DIAGNOSIS — E876 Hypokalemia: Secondary | ICD-10-CM | POA: Insufficient documentation

## 2023-04-25 DIAGNOSIS — Z801 Family history of malignant neoplasm of trachea, bronchus and lung: Secondary | ICD-10-CM | POA: Insufficient documentation

## 2023-04-25 DIAGNOSIS — Z87891 Personal history of nicotine dependence: Secondary | ICD-10-CM | POA: Diagnosis not present

## 2023-04-25 DIAGNOSIS — Z808 Family history of malignant neoplasm of other organs or systems: Secondary | ICD-10-CM | POA: Diagnosis not present

## 2023-04-25 LAB — CBC WITH DIFFERENTIAL/PLATELET
Abs Immature Granulocytes: 0.01 10*3/uL (ref 0.00–0.07)
Basophils Absolute: 0 10*3/uL (ref 0.0–0.1)
Basophils Relative: 1 %
Eosinophils Absolute: 0.1 10*3/uL (ref 0.0–0.5)
Eosinophils Relative: 3 %
HCT: 23.9 % — ABNORMAL LOW (ref 39.0–52.0)
Hemoglobin: 8.1 g/dL — ABNORMAL LOW (ref 13.0–17.0)
Immature Granulocytes: 0 %
Lymphocytes Relative: 46 %
Lymphs Abs: 1.3 10*3/uL (ref 0.7–4.0)
MCH: 38.4 pg — ABNORMAL HIGH (ref 26.0–34.0)
MCHC: 33.9 g/dL (ref 30.0–36.0)
MCV: 113.3 fL — ABNORMAL HIGH (ref 80.0–100.0)
Monocytes Absolute: 0.2 10*3/uL (ref 0.1–1.0)
Monocytes Relative: 6 %
Neutro Abs: 1.2 10*3/uL — ABNORMAL LOW (ref 1.7–7.7)
Neutrophils Relative %: 44 %
Platelets: 56 10*3/uL — ABNORMAL LOW (ref 150–400)
RBC: 2.11 MIL/uL — ABNORMAL LOW (ref 4.22–5.81)
RDW: 14.6 % (ref 11.5–15.5)
WBC: 2.8 10*3/uL — ABNORMAL LOW (ref 4.0–10.5)
nRBC: 0 % (ref 0.0–0.2)

## 2023-04-25 LAB — COMPREHENSIVE METABOLIC PANEL
ALT: 12 U/L (ref 0–44)
AST: 16 U/L (ref 15–41)
Albumin: 3.5 g/dL (ref 3.5–5.0)
Alkaline Phosphatase: 42 U/L (ref 38–126)
Anion gap: 10 (ref 5–15)
BUN: 20 mg/dL (ref 8–23)
CO2: 24 mmol/L (ref 22–32)
Calcium: 8.8 mg/dL — ABNORMAL LOW (ref 8.9–10.3)
Chloride: 95 mmol/L — ABNORMAL LOW (ref 98–111)
Creatinine, Ser: 1.38 mg/dL — ABNORMAL HIGH (ref 0.61–1.24)
GFR, Estimated: 52 mL/min — ABNORMAL LOW (ref >60)
Glucose, Bld: 106 mg/dL — ABNORMAL HIGH (ref 70–99)
Potassium: 3.1 mmol/L — ABNORMAL LOW (ref 3.5–5.1)
Sodium: 129 mmol/L — ABNORMAL LOW (ref 135–145)
Total Bilirubin: 0.4 mg/dL (ref <1.2)
Total Protein: 7.4 g/dL (ref 6.5–8.1)

## 2023-04-25 LAB — SAMPLE TO BLOOD BANK

## 2023-04-25 LAB — LACTATE DEHYDROGENASE: LDH: 110 U/L (ref 98–192)

## 2023-04-25 NOTE — Patient Instructions (Addendum)
[facility] at [facility] Discharge Instructions   You were seen and examined today by Dr. Ellin Saba.  He reviewed the results of your lab work which are mostly normal/stable. Your platelet count is low at 56,000 today. Your hemoglobin today is 8.1, which is stable.   Continue Revlimid as prescribed.   Return as scheduled.    Thank you for choosing [facility] at [facility] to provide your oncology and hematology care.  To afford each patient quality time with our provider, please arrive at least 15 minutes before your scheduled appointment time.   If you have a lab appointment with the Cancer Center please come in thru the Main Entrance and check in at the main information desk.  You need to re-schedule your appointment should you arrive 10 or more minutes late.  We strive to give you quality time with our providers, and arriving late affects you and other patients whose appointments are after yours.  Also, if you no show three or more times for appointments you may be dismissed from the clinic at the providers discretion.     Again, thank you for choosing [facility].  Our hope is that these requests will decrease the amount of time that you wait before being seen by our physicians.       _____________________________________________________________  Should you have questions after your visit to [facility], please contact our office at [phone] and follow the prompts.  Our office hours are 8:00 a.m. and 4:30 p.m. Monday - Friday.  Please note that voicemails left after 4:00 p.m. may not be returned until the following business day.  We are closed weekends and major holidays.  You do have access to a nurse 24-7, just call the main number to the clinic [phone] and do not press any options, hold on the line and a nurse will answer the phone.    For prescription refill requests, have your pharmacy contact our  office and allow 72 hours.    Due to Covid, you will need to wear a mask upon entering the hospital. If you do not have a mask, a mask will be given to you at the Main Entrance upon arrival. For doctor visits, patients may have 1 support person age 73 or older with them. For treatment visits, patients can not have anyone with them due to social distancing guidelines and our immunocompromised population.

## 2023-04-25 NOTE — Progress Notes (Signed)
[name] [address] [place] [address]    Clinic Day:  04/25/2023  Referring physician: Karie Schwalbe, MD  Patient Care Team: Karie Schwalbe, MD as PCP - General (Internal Medicine) Wyline Mood Dorothe Pea, MD as PCP - Cardiology (Cardiology) Doreatha Massed, MD as Medical Oncologist (Hematology)   ASSESSMENT & PLAN:   Assessment: 1.  Hereditary hemochromatosis: -Patient seen for elevated ferritin levels. -EMR evaluation shows hemochromatosis testing on 01/27/2019 which showed heterozygosity for C282Y and H63D variants. -Given the elevated ferritin levels above 1000 and the results of genetic testing, this is compatible with hereditary hemochromatosis. -Patient does have arthritis of the small joints of the hands and arthritis of the back. -No history of CHF but has CAD.  No diabetes. -MRI of the liver on 12/11/2019 shows estimated liver iron concentration approximately 7.7 mg/g compatible with moderate liver iron.  Estimated fat fraction ranges from 7-12%, more pronounced in the right lobe compared to left hepatic lobe.  No focal hepatic lesions seen.  No morphological changes of significant liver disease. -Jadenu 540 mg daily started on 12/24/2019, dose increased to 720 mg daily on 02/09/2020.  - MRI liver (09/21/2021): Diminished liver iron concentration less than 2 mg/g, slightly below "mild" designation (previously well into the "moderate" range). - Dose decreased to 2 tablets daily on 07/04/2022   2.  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 Myrtie Neither on 02/17/2019 shows diverticulosis in the left colon, 12 mm polyp in the cecum, 4 to 6 mm polyp in the ascending colon and 5 mm polyp in the descending colon.  Pathology was consistent with tubular adenomas. - Colonoscopy (11/30/2022): 8 mm polyp in the transverse colon.  Multiple diverticula found in the left colon.  Internal hemorrhoids were found.  Exam was otherwise normal. - EGD  (11/30/2022): Normal esophagus.  Multiple erosions with no bleeding and no stigmata of recent bleeding in the prepyloric region of the stomach. - Serum EPO level 54.4 - NGS: SF3B1 (variant frequency 31%) - Bone marrow biopsy: Hypercellular marrow with dyspoietic changes primarily involving erythroid series associated with ring sideroblasts.  No increase in blasts.  Findings consistent with low-grade MDS with ring sideroblasts (More than 15% of erythroid precursors) - Karyotype: 46, XY,del(20)(q11.2q13.3)[3]/46,XY[17] - MDS FISH: Deletion 5 q. and deletion 20 q. - IPSS-R: Score 2.5 points, low risk MDS - IPSS-M: Score 0.83.  Leukemia free survival: 5.9 years, 1.7% by 1 year and 5.1% by 4 years AML transformation. 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 - Revlimid 10 mg daily started on 01/05/2023, held on 02/12/2023 due to thrombocytopenia and leukopenia.  Revlimid 5 mg daily started on 02/22/2023.    Plan: 1.  Hereditary hemochromatosis: - [name] was held since 09/13/2022 due to severe anemia. - Latest ferritin is 389, percent saturation 50. - I would not restart Jadenu yet.  Recommend repeating ferritin and iron panel at next visit in 4 weeks.   2.  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 - He has occasional diarrhea, once per week, did have to take Imodium. - Does not report any recent infections. - CBC today shows hemoglobin 8.1, WBC 2.8, ANC 1.2.  Platelet count dropped to 56 from 181 at last visit. - Would continue Revlimid at the same dose of 5 mg daily.  If the platelet count drops below 30K, we will hold Revlimid  and cut back the dose to 2.5 mg daily. - Will also check CBC in 2 weeks to see if he needs any transfusion. - RTC 4 weeks for follow-up with repeat labs.   3.  Elevated creatinine and hypokalemia: - Creatinine is 1.38, improved.  He has mild  hypokalemia with potassium 3.1.  I am not starting on potassium supplements yet.    No orders of the defined types were placed in this encounter.     I,Katie Daubenspeck,acting as a Neurosurgeon for Doreatha Massed, MD.,have documented all relevant documentation on the behalf of Doreatha Massed, MD,as directed by  Doreatha Massed, MD while in the presence of Doreatha Massed, MD.   I, Doreatha Massed MD, have reviewed the above documentation for accuracy and completeness, and I agree with the above.   Doreatha Massed, MD   12/18/20245:56 PM  CHIEF COMPLAINT:   Diagnosis: hereditary hemochromatosis and low risk MDS    Cancer Staging  No matching staging information was found for the patient.    Prior Therapy: Jadenu, 12/24/19 - 09/13/22, held due to severe anemia   Current Therapy:  Revlimid 5 mg daily    HISTORY OF PRESENT ILLNESS:   Oncology History   No history exists.     INTERVAL HISTORY:   Aaron Sexton is a 77 y.o. male presenting to clinic today for follow up of MDS and hemochromatosis. He was last seen by me 6 weeks ago.  Today, he states that he is doing well overall. His appetite level is at 80%. His energy level is at 80%.  PAST MEDICAL HISTORY:   Past Medical History: 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   Cerebrovascular disease    Moderate ASVD without focal stenosis in 01/2007   Clotting disorder (HCC)    Colon polyps 07/2010   single 2mm polyp--tubular adenoma   Diverticulosis 2012   found on colonoscopy   Erectile dysfunction    Family history of adverse reaction to anesthesia    difficult for son & sistor to wake    GERD (gastroesophageal reflux disease)    Hematochezia 05/24/2010   Hyperlipidemia    Hypertension    Osteoarthritis    knees/hands-Dr Lucey   Overweight(278.02)    Peripheral vascular disease (HCC)  03/11/2010   Moderate SFA stenosis; history of claudication   Pneumonia    Rosacea    Vitamin B12 deficiency     Surgical History: Past Surgical History:  Procedure Laterality Date   APPENDECTOMY     APPLICATION OF WOUND VAC Left 12/17/2021   Procedure: APPLICATION OF PREVENA WOUND VAC LEFT GROIN;  Surgeon: Victorino Sparrow, MD;  Location: [facility] OR;  Service: Vascular;  Laterality: Left;   APPLICATION OF WOUND VAC Left 01/08/2022   Procedure: APPLICATION OF WOUND VAC left groin.;  Surgeon: Cephus Shelling, MD;  Location: [facility] OR;  Service: Vascular;  Laterality: Left;   COLONOSCOPY W/ POLYPECTOMY  2012   ENDARTERECTOMY FEMORAL Left 12/09/2021   Procedure: LEFT COMMON FEMORAL ENDARTERECTOMY WITH 1 CM X 6 CM XENOSURE BOVINE PATCH ANGIOPLASTY;  Surgeon: Cephus Shelling, MD;  Location: [facility] OR;  Service: Vascular;  Laterality: Left;   FEMORAL-POPLITEAL BYPASS GRAFT Left 12/09/2021   Procedure: LEFT FEMORAL- ABOVE KNEE POPLITEAL BYPASS WITH 6 mm x 80 cm PROPATEN GRAFT;  Surgeon: Cephus Shelling, MD;  Location: [facility] OR;  Service: Vascular;  Laterality: Left;  INSERT ARTERIAL LINE   ILIAC VEIN ANGIOPLASTY / STENTING Right 08/09/2015   INCISION AND DRAINAGE OF WOUND Left 01/08/2022   Procedure: IRRIGATION AND DEBRIDEMENT GROIN LEFT application of Myriad Morcells.;  Surgeon: Cephus Shelling, MD;  Location: [facility] OR;  Service: Vascular;  Laterality: Left;   INSERTION OF ILIAC STENT  12/09/2021   Procedure: INSERTION OF LEFT COMMON AND EXTERNAL  ILIAC STENTS WITH ANGIOPLASTY;  Surgeon: Cephus Shelling, MD;  Location: [facility] OR;  Service: Vascular;;   LOWER EXTREMITY ANGIOGRAM Left 12/09/2021   Procedure: LEFT ILIAC AND LOWER EXTREMITY ANGIOGRAM;  Surgeon: Cephus Shelling, MD;  Location: [facility] OR;  Service: Vascular;  Laterality: Left;   LOWER EXTREMITY ANGIOGRAPHY Bilateral 11/03/2021   Procedure: Lower Extremity Angiography;  Surgeon: Runell Gess, MD;  Location: [facility] INVASIVE CV LAB;  Service:  Cardiovascular;  Laterality: Bilateral;  Limited Study   PERIPHERAL VASCULAR CATHETERIZATION Bilateral 06/21/2015   Procedure: Lower Extremity Angiography;  Surgeon: Runell Gess, MD;  Location: [facility] INVASIVE CV LAB;  Service: Cardiovascular;  Laterality: Bilateral;   PERIPHERAL VASCULAR CATHETERIZATION N/A 06/21/2015   Procedure: Abdominal Aortogram;  Surgeon: Runell Gess, MD;  Location: [facility] INVASIVE CV LAB;  Service: Cardiovascular;  Laterality: N/A;   PERIPHERAL VASCULAR CATHETERIZATION N/A 07/26/2015   Procedure: Lower Extremity Angiography;  Surgeon: Runell Gess, MD;  Location: [facility] INVASIVE CV LAB;  Service: Cardiovascular;  Laterality: N/A;   PERIPHERAL VASCULAR CATHETERIZATION N/A 08/09/2015   Procedure: Lower Extremity Angiography;  Surgeon: Runell Gess, MD;  Location: [facility] INVASIVE CV LAB;  Service: Cardiovascular;  Laterality: N/A;   PERIPHERAL VASCULAR CATHETERIZATION  08/09/2015   Procedure: Peripheral Vascular Intervention;  Surgeon: Runell Gess, MD;  Location: [facility] INVASIVE CV LAB;  Service: Cardiovascular;;  rt ext. iliac atherectomy and stent   PERIPHERAL VASCULAR CATHETERIZATION N/A 09/20/2015   Procedure: Lower Extremity Angiography;  Surgeon: Runell Gess, MD;  Location: [facility] INVASIVE CV LAB;  Service: Cardiovascular;  Laterality: N/A;   PERIPHERAL VASCULAR CATHETERIZATION Right 09/20/2015   Procedure: Peripheral Vascular Intervention;  Surgeon: Runell Gess, MD;  Location: [facility] INVASIVE CV LAB;  Service: Cardiovascular;  Laterality: Right;  SFA   ROTATOR CUFF REPAIR Left 7/15   Dr Sherlean Foot   WOUND EXPLORATION Left 12/17/2021   Procedure: LEFT LEG WOUND EXPLORATION AND WASH OUT WITH MYRIAD MORCELLS;  Surgeon: Victorino Sparrow, MD;  Location: [facility] OR;  Service: Vascular;  Laterality: Left;    Social History: Social History   Socioeconomic History   Marital status: Married    Spouse name: Not on file   Number of children: 3   Years of education: Not on file    Highest education level: Not on file  Occupational History   Occupation: Managed supply chain--- retired 2013    Comment: [employer]  Tobacco Use   Smoking status: Former    Current packs/day: 0.00    Average packs/day: 1 pack/day for 30.0 years (30.0 ttl pk-yrs)    Types: Cigarettes    Start date: 05/16/1960    Quit date: 05/16/1990    Years since quitting: 32.9    Passive exposure: Never   Smokeless tobacco: Never   Tobacco comments:    Quit in 1993  Vaping Use   Vaping status: Never Used  Substance and Sexual Activity   Alcohol use: No    Alcohol/week: 0.0 standard drinks of alcohol   Drug use: No   Sexual activity: Not on file  Other Topics Concern  Not on file  Social History Narrative   Has living will   Wife is health care POA---alternate is son or daughter   Would accept resuscitation but no prolonged artificial ventilation.   No tube feeds if cognitively unaware   Social Drivers of Health   Financial Resource Strain: Low Risk  (03/10/2020)   Overall Financial Resource Strain (CARDIA)    Difficulty of Paying Living Expenses: Not hard at all  Food Insecurity: No Food Insecurity (01/13/2022)   Hunger Vital Sign    Worried About Running Out of Food in the Last Year: Never true    Ran Out of Food in the Last Year: Never true  Transportation Needs: No Transportation Needs (01/13/2022)   PRAPARE - Administrator, Civil Service (Medical): No    Lack of Transportation (Non-Medical): No  Physical Activity: Inactive (03/10/2020)   Exercise Vital Sign    Days of Exercise per Week: 0 days    Minutes of Exercise per Session: 0 min  Stress: No Stress Concern Present (03/10/2020)   [employer] of Occupational Health - Occupational Stress Questionnaire    Feeling of Stress : Not at all  Social Connections: Moderately Integrated (03/10/2020)   Social Connection and Isolation Panel [NHANES]    Frequency of Communication with Friends and Family: More  than three times a week    Frequency of Social Gatherings with Friends and Family: Never    Attends Religious Services: More than 4 times per year    Active Member of [employer] or Organizations: No    Attends Banker Meetings: Never    Marital Status: Married  Catering manager Violence: Not At Risk (03/10/2020)   Humiliation, Afraid, Rape, and Kick questionnaire    Fear of Current or Ex-Partner: No    Emotionally Abused: No    Physically Abused: No    Sexually Abused: No    Family History: Family History  Problem Relation Age of Onset   Lung cancer Mother    Diabetes Mother    Hypertension Father        And siblings   Heart disease Father        And second-degree relatives   Transient ischemic attack Father    Brain cancer Sister    Heart disease Sister    Heart disease Sister    Thyroid disease Sister    Diabetes Brother    Atrial fibrillation Brother    Ulcers Son    Diabetes Son    Diabetes Son    Colon cancer Neg Hx    Stomach cancer Neg Hx    Rectal cancer Neg Hx     Current Medications:  Current Outpatient Medications:    aspirin 81 MG tablet, Take 81 mg by mouth every evening., Disp: , Rfl:    chlorthalidone (HYGROTON) 25 MG tablet, TAKE 1/2 TABLET(12.5 MG) BY MOUTH DAILY, Disp: 30 tablet, Rfl: 3   clopidogrel (PLAVIX) 75 MG tablet, TAKE 1 TABLET(75 MG) BY MOUTH DAILY WITH BREAKFAST, Disp: 90 tablet, Rfl: 3   Cyanocobalamin (VITAMIN B-12) 500 MCG SUBL, Place 500 mcg under the tongue daily., Disp: , Rfl:    lenalidomide (REVLIMID) 5 MG capsule, Take 1 capsule (5 mg total) by mouth daily., Disp: 28 capsule, Rfl: 0   loperamide (IMODIUM A-D) 2 MG tablet, Take 2 mg by mouth 4 (four) times daily as needed for diarrhea or loose stools., Disp: , Rfl:    losartan (COZAAR) 25 MG tablet, TAKE 1 TABLET(25 MG)  BY MOUTH DAILY, Disp: 90 tablet, Rfl: 3   metoprolol succinate (TOPROL-XL) 100 MG 24 hr tablet, TAKE 1 TABLET(100 MG) BY MOUTH DAILY WITH OR IMMEDIATELY  FOLLOWING A MEAL, Disp: 90 tablet, Rfl: 1   nitroGLYCERIN (NITROSTAT) 0.4 MG SL tablet, Place 1 tablet (0.4 mg total) under the tongue every 5 (five) minutes as needed for chest pain., Disp: 25 tablet, Rfl: 1   Omega-3 Fatty Acids (FISH OIL) 1000 MG CAPS, Take 1,000 mg by mouth daily., Disp: , Rfl:    omeprazole (PRILOSEC) 20 MG capsule, Take 20 mg by mouth every other day., Disp: , Rfl:    pravastatin (PRAVACHOL) 40 MG tablet, TAKE 1 TABLET(40 MG) BY MOUTH DAILY, Disp: 90 tablet, Rfl: 3   traMADol (ULTRAM) 50 MG tablet, TAKE 1 TABLET BY MOUTH EVERY 8 HOURS AS NEEDED, Disp: 90 tablet, Rfl: 0   Allergies: Allergies  Allergen Reactions   Cardizem [Diltiazem Hcl] Hives and Rash    REVIEW OF SYSTEMS:   Review of Systems  Constitutional:  Negative for chills, fatigue and fever.  HENT:   Negative for lump/mass, mouth sores, nosebleeds, sore throat and trouble swallowing.   Eyes:  Negative for eye problems.  Respiratory:  Negative for cough and shortness of breath.   Cardiovascular:  Negative for chest pain, leg swelling and palpitations.  Gastrointestinal:  Positive for diarrhea. Negative for abdominal pain, constipation, nausea and vomiting.  Genitourinary:  Negative for bladder incontinence, difficulty urinating, dysuria, frequency, hematuria and nocturia.   Musculoskeletal:  Negative for arthralgias, back pain, flank pain, myalgias and neck pain.  Skin:  Negative for itching and rash.  Neurological:  Positive for numbness. Negative for dizziness and headaches.  Hematological:  Does not bruise/bleed easily.  Psychiatric/Behavioral:  Negative for depression, sleep disturbance and suicidal ideas. The patient is not nervous/anxious.   All other systems reviewed and are negative.    VITALS:   Blood pressure (!) 133/55, pulse 71, temperature 99.4 F (37.4 C), temperature source Tympanic, resp. rate 18, weight 195 lb (88.5 kg), SpO2 98%.  Wt Readings from Last 3 Encounters:  04/25/23 195 lb  (88.5 kg)  03/08/23 198 lb 12.8 oz (90.2 kg)  02/13/23 200 lb (90.7 kg)    Body mass index is 30.09 kg/m.  Performance status (ECOG): 1 - Symptomatic but completely ambulatory  PHYSICAL EXAM:   Physical Exam Vitals and nursing note reviewed. Exam conducted with a chaperone present.  Constitutional:      Appearance: Normal appearance.  Cardiovascular:     Rate and Rhythm: Normal rate and regular rhythm.     Pulses: Normal pulses.     Heart sounds: Normal heart sounds.  Pulmonary:     Effort: Pulmonary effort is normal.     Breath sounds: Normal breath sounds.  Abdominal:     Palpations: Abdomen is soft. There is no hepatomegaly, splenomegaly or mass.     Tenderness: There is no abdominal tenderness.  Musculoskeletal:     Right lower leg: No edema.     Left lower leg: No edema.  Lymphadenopathy:     Cervical: No cervical adenopathy.     Right cervical: No superficial, deep or posterior cervical adenopathy.    Left cervical: No superficial, deep or posterior cervical adenopathy.     Upper Body:     Right upper body: No supraclavicular or axillary adenopathy.     Left upper body: No supraclavicular or axillary adenopathy.  Neurological:     General: No focal deficit present.  Mental Status: He is alert and oriented to person, place, and time.  Psychiatric:        Mood and Affect: Mood normal.        Behavior: Behavior normal.     LABS:      Latest Ref Rng & Units 04/25/2023   12:21 PM 03/21/2023    1:55 PM 03/08/2023   11:15 AM  CBC  WBC 4.0 - 10.5 K/uL 2.8  3.8  2.9   Hemoglobin 13.0 - 17.0 g/dL 8.1  8.9  8.2   Hematocrit 39.0 - 52.0 % 23.9  26.4  24.2   Platelets 150 - 400 K/uL 56  181  159       Latest Ref Rng & Units 04/25/2023   12:21 PM 03/08/2023   11:15 AM 02/13/2023    8:43 AM  CMP  Glucose 70 - 99 mg/dL 191  478  295   BUN 8 - 23 mg/dL 20  22  21    Creatinine 0.61 - 1.24 mg/dL 6.21  3.08  6.57   Sodium 135 - 145 mmol/L 129  131  130    Potassium 3.5 - 5.1 mmol/L 3.1  3.2  3.1   Chloride 98 - 111 mmol/L 95  98  97   CO2 22 - 32 mmol/L 24  24  22    Calcium 8.9 - 10.3 mg/dL 8.8  8.7  8.2   Total Protein 6.5 - 8.1 g/dL 7.4  7.4  7.6   Total Bilirubin <1.2 mg/dL 0.4  0.5  0.4   Alkaline Phos 38 - 126 U/L 42  40  44   AST 15 - 41 U/L 16  18  17    ALT 0 - 44 U/L 12  15  13       No results found for: "CEA1", "CEA" / No results found for: "CEA1", "CEA" No results found for: "PSA1" No results found for: "CAN199" No results found for: "CAN125"  Lab Results  Component Value Date   TOTALPROTELP 7.2 10/04/2022   ALBUMINELP 4.0 08/16/2022   A1GS 0.2 08/16/2022   A2GS 0.7 08/16/2022   BETS 1.0 08/16/2022   GAMS 1.3 08/16/2022   MSPIKE Not Observed 08/16/2022   SPEI Comment 08/16/2022   Lab Results  Component Value Date   TIBC 252 02/13/2023   TIBC 231 (L) 12/11/2022   TIBC 241 (L) 11/06/2022   FERRITIN 389 (H) 02/13/2023   FERRITIN 121 12/11/2022   FERRITIN 88 11/06/2022   IRONPCTSAT 50 (H) 02/13/2023   IRONPCTSAT 65 (H) 12/11/2022   IRONPCTSAT 66 (H) 11/06/2022   Lab Results  Component Value Date   LDH 110 04/25/2023   LDH 107 03/08/2023   LDH 101 02/13/2023     STUDIES:   No results found.

## 2023-05-07 ENCOUNTER — Other Ambulatory Visit: Payer: Self-pay

## 2023-05-07 DIAGNOSIS — D469 Myelodysplastic syndrome, unspecified: Secondary | ICD-10-CM

## 2023-05-08 ENCOUNTER — Inpatient Hospital Stay: Payer: Medicare PPO

## 2023-05-08 ENCOUNTER — Other Ambulatory Visit: Payer: Self-pay | Admitting: Hematology

## 2023-05-08 DIAGNOSIS — Z801 Family history of malignant neoplasm of trachea, bronchus and lung: Secondary | ICD-10-CM | POA: Diagnosis not present

## 2023-05-08 DIAGNOSIS — Z87891 Personal history of nicotine dependence: Secondary | ICD-10-CM | POA: Diagnosis not present

## 2023-05-08 DIAGNOSIS — D469 Myelodysplastic syndrome, unspecified: Secondary | ICD-10-CM

## 2023-05-08 DIAGNOSIS — D461 Refractory anemia with ring sideroblasts: Secondary | ICD-10-CM | POA: Diagnosis not present

## 2023-05-08 DIAGNOSIS — E876 Hypokalemia: Secondary | ICD-10-CM | POA: Diagnosis not present

## 2023-05-08 DIAGNOSIS — Z808 Family history of malignant neoplasm of other organs or systems: Secondary | ICD-10-CM | POA: Diagnosis not present

## 2023-05-08 DIAGNOSIS — R7989 Other specified abnormal findings of blood chemistry: Secondary | ICD-10-CM | POA: Diagnosis not present

## 2023-05-08 LAB — CBC WITH DIFFERENTIAL/PLATELET
Abs Immature Granulocytes: 0 10*3/uL (ref 0.00–0.07)
Basophils Absolute: 0 10*3/uL (ref 0.0–0.1)
Basophils Relative: 0 %
Eosinophils Absolute: 0 10*3/uL (ref 0.0–0.5)
Eosinophils Relative: 2 %
HCT: 22.5 % — ABNORMAL LOW (ref 39.0–52.0)
Hemoglobin: 7.3 g/dL — ABNORMAL LOW (ref 13.0–17.0)
Lymphocytes Relative: 53 %
Lymphs Abs: 1.2 10*3/uL (ref 0.7–4.0)
MCH: 37.2 pg — ABNORMAL HIGH (ref 26.0–34.0)
MCHC: 32.4 g/dL (ref 30.0–36.0)
MCV: 114.8 fL — ABNORMAL HIGH (ref 80.0–100.0)
Monocytes Absolute: 0 10*3/uL — ABNORMAL LOW (ref 0.1–1.0)
Monocytes Relative: 2 %
Neutro Abs: 1 10*3/uL — ABNORMAL LOW (ref 1.7–7.7)
Neutrophils Relative %: 43 %
Platelets: 58 10*3/uL — ABNORMAL LOW (ref 150–400)
RBC: 1.96 MIL/uL — ABNORMAL LOW (ref 4.22–5.81)
RDW: 14.7 % (ref 11.5–15.5)
Smear Review: DECREASED
WBC: 2.3 10*3/uL — ABNORMAL LOW (ref 4.0–10.5)
nRBC: 0 % (ref 0.0–0.2)

## 2023-05-08 LAB — IRON AND TIBC
Iron: 77 ug/dL (ref 45–182)
Saturation Ratios: 32 % (ref 17.9–39.5)
TIBC: 244 ug/dL — ABNORMAL LOW (ref 250–450)
UIBC: 167 ug/dL

## 2023-05-08 LAB — FERRITIN: Ferritin: 279 ng/mL (ref 24–336)

## 2023-05-08 LAB — SAMPLE TO BLOOD BANK

## 2023-05-10 ENCOUNTER — Other Ambulatory Visit: Payer: Self-pay | Admitting: *Deleted

## 2023-05-10 ENCOUNTER — Other Ambulatory Visit: Payer: Self-pay

## 2023-05-10 ENCOUNTER — Inpatient Hospital Stay: Payer: Medicare PPO | Attending: Hematology | Admitting: Hematology

## 2023-05-10 ENCOUNTER — Telehealth: Payer: Self-pay | Admitting: *Deleted

## 2023-05-10 DIAGNOSIS — I2510 Atherosclerotic heart disease of native coronary artery without angina pectoris: Secondary | ICD-10-CM | POA: Insufficient documentation

## 2023-05-10 DIAGNOSIS — D469 Myelodysplastic syndrome, unspecified: Secondary | ICD-10-CM

## 2023-05-10 DIAGNOSIS — R944 Abnormal results of kidney function studies: Secondary | ICD-10-CM | POA: Diagnosis not present

## 2023-05-10 DIAGNOSIS — D649 Anemia, unspecified: Secondary | ICD-10-CM

## 2023-05-10 DIAGNOSIS — E876 Hypokalemia: Secondary | ICD-10-CM | POA: Insufficient documentation

## 2023-05-10 DIAGNOSIS — J90 Pleural effusion, not elsewhere classified: Secondary | ICD-10-CM | POA: Diagnosis not present

## 2023-05-10 LAB — CBC WITH DIFFERENTIAL/PLATELET
Abs Immature Granulocytes: 0.01 10*3/uL (ref 0.00–0.07)
Basophils Absolute: 0 10*3/uL (ref 0.0–0.1)
Basophils Relative: 0 %
Eosinophils Absolute: 0.1 10*3/uL (ref 0.0–0.5)
Eosinophils Relative: 2 %
HCT: 21.8 % — ABNORMAL LOW (ref 39.0–52.0)
Hemoglobin: 7.4 g/dL — ABNORMAL LOW (ref 13.0–17.0)
Immature Granulocytes: 0 %
Lymphocytes Relative: 51 %
Lymphs Abs: 1.2 10*3/uL (ref 0.7–4.0)
MCH: 38.9 pg — ABNORMAL HIGH (ref 26.0–34.0)
MCHC: 33.9 g/dL (ref 30.0–36.0)
MCV: 114.7 fL — ABNORMAL HIGH (ref 80.0–100.0)
Monocytes Absolute: 0.2 10*3/uL (ref 0.1–1.0)
Monocytes Relative: 8 %
Neutro Abs: 0.9 10*3/uL — ABNORMAL LOW (ref 1.7–7.7)
Neutrophils Relative %: 39 %
Platelets: 57 10*3/uL — ABNORMAL LOW (ref 150–400)
RBC: 1.9 MIL/uL — ABNORMAL LOW (ref 4.22–5.81)
RDW: 14.8 % (ref 11.5–15.5)
Smear Review: DECREASED
WBC: 2.4 10*3/uL — ABNORMAL LOW (ref 4.0–10.5)
nRBC: 0 % (ref 0.0–0.2)

## 2023-05-10 LAB — SAMPLE TO BLOOD BANK

## 2023-05-10 LAB — PREPARE RBC (CROSSMATCH)

## 2023-05-10 MED ORDER — LENALIDOMIDE 5 MG PO CAPS: 5.0000 mg | ORAL_CAPSULE | Freq: Every day | ORAL | 0 refills | Status: DC

## 2023-05-10 MED ORDER — HEPARIN SOD (PORK) LOCK FLUSH 100 UNIT/ML IV SOLN: 500.0000 [IU] | Freq: Every day | INTRAVENOUS | Status: DC | PRN

## 2023-05-10 NOTE — Telephone Encounter (Signed)
 Chart reviewed. Revlimid refilled per last office note with Dr. Ellin Saba.

## 2023-05-10 NOTE — Telephone Encounter (Signed)
 Patient called to review labs done on 12/31.  Hgb has dropped to 7.3 and he c/o weakness.  Per Dr. Rogers, will schedule for 1 unit PRBC.  Per Elvie in BB, his BB tube will be expired, therefore will be brought in today for redraw and schedule for Friday transfusion.

## 2023-05-11 ENCOUNTER — Inpatient Hospital Stay: Payer: Medicare PPO

## 2023-05-11 DIAGNOSIS — D469 Myelodysplastic syndrome, unspecified: Secondary | ICD-10-CM

## 2023-05-11 DIAGNOSIS — J90 Pleural effusion, not elsewhere classified: Secondary | ICD-10-CM | POA: Diagnosis not present

## 2023-05-11 DIAGNOSIS — I2510 Atherosclerotic heart disease of native coronary artery without angina pectoris: Secondary | ICD-10-CM | POA: Diagnosis not present

## 2023-05-11 DIAGNOSIS — D649 Anemia, unspecified: Secondary | ICD-10-CM

## 2023-05-11 DIAGNOSIS — R944 Abnormal results of kidney function studies: Secondary | ICD-10-CM | POA: Diagnosis not present

## 2023-05-11 DIAGNOSIS — E876 Hypokalemia: Secondary | ICD-10-CM | POA: Diagnosis not present

## 2023-05-11 MED ORDER — SODIUM CHLORIDE 0.9% IV SOLUTION
250.0000 mL | INTRAVENOUS | Status: DC
  Administered 2023-05-11: 250 mL via INTRAVENOUS

## 2023-05-11 NOTE — Progress Notes (Signed)
 Patient presents today for 1 unit of blood per provider's order. Vital signs stable and patient's only complaint is weakness.   Patient took Tylenol  and Benadryl  at home prior to arrival. Peripheral IV started with good blood return pre and post infusion.  Discharged from clinic via wheelchair in stable condition. Alert and oriented x 3. F/U with [facility] as scheduled.

## 2023-05-11 NOTE — Patient Instructions (Signed)
 [facility] - A DEPT OF [facility]  Discharge Instructions: Thank you for choosing [facility] to provide your oncology and hematology care.  If you have a lab appointment with the Cancer Center - please note that after April 8th, 2024, all labs will be drawn in the cancer center.  You do not have to check in or register with the main entrance as you have in the past but will complete your check-in in the cancer center.  Wear comfortable clothing and clothing appropriate for easy access to any Portacath or PICC line.   We strive to give you quality time with your provider. You may need to reschedule your appointment if you arrive late (15 or more minutes).  Arriving late affects you and other patients whose appointments are after yours.  Also, if you miss three or more appointments without notifying the office, you may be dismissed from the clinic at the provider's discretion.      For prescription refill requests, have your pharmacy contact our office and allow 72 hours for refills to be completed.    Today you received 1 unit of blood.   BELOW ARE SYMPTOMS THAT SHOULD BE REPORTED IMMEDIATELY: *FEVER GREATER THAN 100.4 F (38 C) OR HIGHER *CHILLS OR SWEATING *NAUSEA AND VOMITING THAT IS NOT CONTROLLED WITH YOUR NAUSEA MEDICATION *UNUSUAL SHORTNESS OF BREATH *UNUSUAL BRUISING OR BLEEDING *URINARY PROBLEMS (pain or burning when urinating, or frequent urination) *BOWEL PROBLEMS (unusual diarrhea, constipation, pain near the anus) TENDERNESS IN MOUTH AND THROAT WITH OR WITHOUT PRESENCE OF ULCERS (sore throat, sores in mouth, or a toothache) UNUSUAL RASH, SWELLING OR PAIN  UNUSUAL VAGINAL DISCHARGE OR ITCHING   Items with * indicate a potential emergency and should be followed up as soon as possible or go to the Emergency Department if any problems should occur.  Please show the CHEMOTHERAPY ALERT CARD or IMMUNOTHERAPY ALERT CARD at check-in to  the Emergency Department and triage nurse.  Should you have questions after your visit or need to cancel or reschedule your appointment, please contact [facility] - A DEPT OF [facility] [phone]  and follow the prompts.  Office hours are 8:00 a.m. to 4:30 p.m. Monday - Friday. Please note that voicemails left after 4:00 p.m. may not be returned until the following business day.  We are closed weekends and major holidays. You have access to a nurse at all times for urgent questions. Please call the main number to the clinic [phone] and follow the prompts.  For any non-urgent questions, you may also contact your provider using MyChart. We now offer e-Visits for anyone 25 and older to request care online for non-urgent symptoms. For details visit [email or web address].   Also download the MyChart app! Go to the app store, search "MyChart", open the app, select [facility], and log in with your MyChart username and password.

## 2023-05-12 LAB — BPAM RBC
Blood Product Expiration Date: 202501152359
ISSUE DATE / TIME: 202501030947
Unit Type and Rh: 1700

## 2023-05-12 LAB — TYPE AND SCREEN
ABO/RH(D): B POS
Antibody Screen: NEGATIVE
Unit division: 0

## 2023-05-18 ENCOUNTER — Encounter: Payer: Self-pay | Admitting: Emergency Medicine

## 2023-05-18 ENCOUNTER — Other Ambulatory Visit: Payer: Self-pay

## 2023-05-18 ENCOUNTER — Inpatient Hospital Stay
Admission: EM | Admit: 2023-05-18 | Discharge: 2023-05-23 | DRG: 193 | Disposition: A | Discharge status: Home Health | Payer: Medicare PPO | Attending: Internal Medicine | Admitting: Internal Medicine

## 2023-05-18 DIAGNOSIS — Z8349 Family history of other endocrine, nutritional and metabolic diseases: Secondary | ICD-10-CM | POA: Diagnosis not present

## 2023-05-18 DIAGNOSIS — Z833 Family history of diabetes mellitus: Secondary | ICD-10-CM | POA: Diagnosis not present

## 2023-05-18 DIAGNOSIS — Z87891 Personal history of nicotine dependence: Secondary | ICD-10-CM

## 2023-05-18 DIAGNOSIS — M25551 Pain in right hip: Secondary | ICD-10-CM | POA: Diagnosis not present

## 2023-05-18 DIAGNOSIS — R0989 Other specified symptoms and signs involving the circulatory and respiratory systems: Secondary | ICD-10-CM | POA: Diagnosis not present

## 2023-05-18 DIAGNOSIS — N1831 Chronic kidney disease, stage 3a: Secondary | ICD-10-CM | POA: Diagnosis present

## 2023-05-18 DIAGNOSIS — E876 Hypokalemia: Secondary | ICD-10-CM | POA: Diagnosis present

## 2023-05-18 DIAGNOSIS — Z1152 Encounter for screening for COVID-19: Secondary | ICD-10-CM | POA: Diagnosis not present

## 2023-05-18 DIAGNOSIS — M461 Sacroiliitis, not elsewhere classified: Secondary | ICD-10-CM | POA: Diagnosis present

## 2023-05-18 DIAGNOSIS — Z79899 Other long term (current) drug therapy: Secondary | ICD-10-CM

## 2023-05-18 DIAGNOSIS — I10 Essential (primary) hypertension: Secondary | ICD-10-CM | POA: Diagnosis present

## 2023-05-18 DIAGNOSIS — N400 Enlarged prostate without lower urinary tract symptoms: Secondary | ICD-10-CM | POA: Diagnosis not present

## 2023-05-18 DIAGNOSIS — I959 Hypotension, unspecified: Secondary | ICD-10-CM | POA: Diagnosis not present

## 2023-05-18 DIAGNOSIS — D469 Myelodysplastic syndrome, unspecified: Secondary | ICD-10-CM | POA: Diagnosis present

## 2023-05-18 DIAGNOSIS — J159 Unspecified bacterial pneumonia: Secondary | ICD-10-CM | POA: Diagnosis present

## 2023-05-18 DIAGNOSIS — J90 Pleural effusion, not elsewhere classified: Secondary | ICD-10-CM | POA: Diagnosis present

## 2023-05-18 DIAGNOSIS — R1031 Right lower quadrant pain: Secondary | ICD-10-CM | POA: Diagnosis not present

## 2023-05-18 DIAGNOSIS — E871 Hypo-osmolality and hyponatremia: Principal | ICD-10-CM

## 2023-05-18 DIAGNOSIS — E785 Hyperlipidemia, unspecified: Secondary | ICD-10-CM | POA: Diagnosis present

## 2023-05-18 DIAGNOSIS — D61818 Other pancytopenia: Secondary | ICD-10-CM | POA: Diagnosis present

## 2023-05-18 DIAGNOSIS — Z8249 Family history of ischemic heart disease and other diseases of the circulatory system: Secondary | ICD-10-CM

## 2023-05-18 DIAGNOSIS — K5730 Diverticulosis of large intestine without perforation or abscess without bleeding: Secondary | ICD-10-CM | POA: Diagnosis not present

## 2023-05-18 DIAGNOSIS — R0902 Hypoxemia: Secondary | ICD-10-CM | POA: Diagnosis not present

## 2023-05-18 DIAGNOSIS — Z7902 Long term (current) use of antithrombotics/antiplatelets: Secondary | ICD-10-CM

## 2023-05-18 DIAGNOSIS — I2510 Atherosclerotic heart disease of native coronary artery without angina pectoris: Secondary | ICD-10-CM | POA: Diagnosis present

## 2023-05-18 DIAGNOSIS — D630 Anemia in neoplastic disease: Secondary | ICD-10-CM | POA: Diagnosis present

## 2023-05-18 DIAGNOSIS — R531 Weakness: Secondary | ICD-10-CM | POA: Diagnosis not present

## 2023-05-18 DIAGNOSIS — M79604 Pain in right leg: Secondary | ICD-10-CM | POA: Diagnosis not present

## 2023-05-18 DIAGNOSIS — I700 Atherosclerosis of aorta: Secondary | ICD-10-CM | POA: Diagnosis present

## 2023-05-18 DIAGNOSIS — I129 Hypertensive chronic kidney disease with stage 1 through stage 4 chronic kidney disease, or unspecified chronic kidney disease: Secondary | ICD-10-CM | POA: Diagnosis present

## 2023-05-18 DIAGNOSIS — J850 Gangrene and necrosis of lung: Secondary | ICD-10-CM | POA: Diagnosis present

## 2023-05-18 DIAGNOSIS — R0602 Shortness of breath: Secondary | ICD-10-CM | POA: Diagnosis not present

## 2023-05-18 DIAGNOSIS — Z7982 Long term (current) use of aspirin: Secondary | ICD-10-CM

## 2023-05-18 DIAGNOSIS — Z888 Allergy status to other drugs, medicaments and biological substances status: Secondary | ICD-10-CM

## 2023-05-18 DIAGNOSIS — E869 Volume depletion, unspecified: Secondary | ICD-10-CM | POA: Diagnosis not present

## 2023-05-18 DIAGNOSIS — M1611 Unilateral primary osteoarthritis, right hip: Secondary | ICD-10-CM | POA: Diagnosis present

## 2023-05-18 DIAGNOSIS — J189 Pneumonia, unspecified organism: Secondary | ICD-10-CM | POA: Diagnosis present

## 2023-05-18 DIAGNOSIS — K219 Gastro-esophageal reflux disease without esophagitis: Secondary | ICD-10-CM | POA: Diagnosis present

## 2023-05-18 DIAGNOSIS — K429 Umbilical hernia without obstruction or gangrene: Secondary | ICD-10-CM | POA: Diagnosis not present

## 2023-05-18 DIAGNOSIS — Z95820 Peripheral vascular angioplasty status with implants and grafts: Secondary | ICD-10-CM

## 2023-05-18 DIAGNOSIS — R590 Localized enlarged lymph nodes: Secondary | ICD-10-CM | POA: Diagnosis not present

## 2023-05-18 DIAGNOSIS — J168 Pneumonia due to other specified infectious organisms: Secondary | ICD-10-CM | POA: Diagnosis not present

## 2023-05-18 DIAGNOSIS — R918 Other nonspecific abnormal finding of lung field: Secondary | ICD-10-CM | POA: Diagnosis not present

## 2023-05-18 LAB — COMPREHENSIVE METABOLIC PANEL
ALT: 69 U/L — ABNORMAL HIGH (ref 0–44)
AST: 107 U/L — ABNORMAL HIGH (ref 15–41)
Albumin: 3.1 g/dL — ABNORMAL LOW (ref 3.5–5.0)
Alkaline Phosphatase: 86 U/L (ref 38–126)
Anion gap: 12 (ref 5–15)
BUN: 34 mg/dL — ABNORMAL HIGH (ref 8–23)
CO2: 22 mmol/L (ref 22–32)
Calcium: 8.4 mg/dL — ABNORMAL LOW (ref 8.9–10.3)
Chloride: 90 mmol/L — ABNORMAL LOW (ref 98–111)
Creatinine, Ser: 1.54 mg/dL — ABNORMAL HIGH (ref 0.61–1.24)
GFR, Estimated: 46 mL/min — ABNORMAL LOW (ref >60)
Glucose, Bld: 114 mg/dL — ABNORMAL HIGH (ref 70–99)
Potassium: 3.2 mmol/L — ABNORMAL LOW (ref 3.5–5.1)
Sodium: 124 mmol/L — ABNORMAL LOW (ref 135–145)
Total Bilirubin: 0.6 mg/dL (ref 0.0–1.2)
Total Protein: 7.5 g/dL (ref 6.5–8.1)

## 2023-05-18 MED ORDER — ONDANSETRON HCL 4 MG/2ML IJ SOLN
4.0000 mg | Freq: Once | INTRAMUSCULAR | Status: AC
  Administered 2023-05-19: 4 mg via INTRAVENOUS
  Filled 2023-05-18: qty 2

## 2023-05-18 MED ORDER — FENTANYL CITRATE PF 50 MCG/ML IJ SOSY
50.0000 ug | PREFILLED_SYRINGE | Freq: Once | INTRAMUSCULAR | Status: AC
  Administered 2023-05-19: 50 ug via INTRAVENOUS
  Filled 2023-05-18: qty 1

## 2023-05-18 NOTE — ED Triage Notes (Addendum)
 Patient bib ems from home for right groin pain x2 days. Patient states the pain is affecting his mobility. Reports R iliac stent in 2019- no issues with stents since. Hx of PAD.

## 2023-05-18 NOTE — ED Provider Notes (Signed)
 [facility] Provider Note    Event Date/Time   First MD Initiated Contact with Patient 05/18/23 2331     (approximate)   History   Groin Pain   HPI  Aaron Sexton is a 78 y.o. male with history of peripheral vascular disease status post lower extremity stents, MDS, hypertension, hyperlipidemia who presents to the emergency department his wife with complaints of right lower abdominal pain, right inguinal pain ongoing for the past 2 days.  States it is more painful with ambulation.  No recent injury.  No chest pain or shortness of breath.  No fever.  Wife reports he has had a cough.  She states he recently received an injection to help with his blood counts and read that it can cause a DVT which concerned her.  He reports he has had a previous appendectomy.   History provided by patient, wife.    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   Cerebrovascular disease    Moderate ASVD without focal stenosis in 01/2007   Clotting disorder (HCC)    Colon polyps 07/2010   single 2mm polyp--tubular adenoma   Diverticulosis 2012   found on colonoscopy   Erectile dysfunction    Family history of adverse reaction to anesthesia    difficult for son & sistor to wake    GERD (gastroesophageal reflux disease)    Hematochezia 05/24/2010   Hyperlipidemia    Hypertension    Osteoarthritis    knees/hands-Dr Lucey   Overweight(278.02)    Peripheral vascular disease (HCC) 03/11/2010   Moderate SFA stenosis; history of claudication   Pneumonia    Rosacea    Vitamin B12 deficiency     Past Surgical History:  Procedure Laterality Date   APPENDECTOMY     APPLICATION OF WOUND VAC Left 12/17/2021   Procedure: APPLICATION OF PREVENA WOUND VAC LEFT GROIN;  Surgeon: Lanis Fonda BRAVO, MD;  Location: [facility] OR;  Service: Vascular;  Laterality: Left;   APPLICATION OF  WOUND VAC Left 01/08/2022   Procedure: APPLICATION OF WOUND VAC left groin.;  Surgeon: Gretta Lonni PARAS, MD;  Location: [facility] OR;  Service: Vascular;  Laterality: Left;   COLONOSCOPY W/ POLYPECTOMY  2012   ENDARTERECTOMY FEMORAL Left 12/09/2021   Procedure: LEFT COMMON FEMORAL ENDARTERECTOMY WITH 1 CM X 6 CM XENOSURE BOVINE PATCH ANGIOPLASTY;  Surgeon: Gretta Lonni PARAS, MD;  Location: [facility] OR;  Service: Vascular;  Laterality: Left;   FEMORAL-POPLITEAL BYPASS GRAFT Left 12/09/2021   Procedure: LEFT FEMORAL- ABOVE KNEE POPLITEAL BYPASS WITH 6 mm x 80 cm PROPATEN GRAFT;  Surgeon: Gretta Lonni PARAS, MD;  Location: [facility] OR;  Service: Vascular;  Laterality: Left;  INSERT ARTERIAL LINE   ILIAC VEIN ANGIOPLASTY / STENTING Right 08/09/2015   INCISION AND DRAINAGE OF WOUND Left 01/08/2022   Procedure: IRRIGATION AND DEBRIDEMENT GROIN LEFT application of Myriad Morcells.;  Surgeon: Gretta Lonni PARAS, MD;  Location: [facility] OR;  Service: Vascular;  Laterality: Left;   INSERTION OF ILIAC STENT  12/09/2021   Procedure: INSERTION OF LEFT COMMON AND EXTERNAL  ILIAC STENTS WITH ANGIOPLASTY;  Surgeon: Gretta Lonni PARAS, MD;  Location: [facility] OR;  Service: Vascular;;   LOWER EXTREMITY ANGIOGRAM Left 12/09/2021   Procedure: LEFT ILIAC AND LOWER EXTREMITY ANGIOGRAM;  Surgeon: Gretta Lonni PARAS, MD;  Location: [facility] OR;  Service: Vascular;  Laterality: Left;   LOWER  EXTREMITY ANGIOGRAPHY Bilateral 11/03/2021   Procedure: Lower Extremity Angiography;  Surgeon: Court Dorn PARAS, MD;  Location: [facility] INVASIVE CV LAB;  Service: Cardiovascular;  Laterality: Bilateral;  Limited Study   PERIPHERAL VASCULAR CATHETERIZATION Bilateral 06/21/2015   Procedure: Lower Extremity Angiography;  Surgeon: Dorn PARAS Court, MD;  Location: [facility] INVASIVE CV LAB;  Service: Cardiovascular;  Laterality: Bilateral;   PERIPHERAL VASCULAR CATHETERIZATION N/A 06/21/2015   Procedure: Abdominal Aortogram;  Surgeon: Dorn PARAS Court, MD;  Location: [facility] INVASIVE CV LAB;   Service: Cardiovascular;  Laterality: N/A;   PERIPHERAL VASCULAR CATHETERIZATION N/A 07/26/2015   Procedure: Lower Extremity Angiography;  Surgeon: Dorn PARAS Court, MD;  Location: [facility] INVASIVE CV LAB;  Service: Cardiovascular;  Laterality: N/A;   PERIPHERAL VASCULAR CATHETERIZATION N/A 08/09/2015   Procedure: Lower Extremity Angiography;  Surgeon: Dorn PARAS Court, MD;  Location: [facility] INVASIVE CV LAB;  Service: Cardiovascular;  Laterality: N/A;   PERIPHERAL VASCULAR CATHETERIZATION  08/09/2015   Procedure: Peripheral Vascular Intervention;  Surgeon: Dorn PARAS Court, MD;  Location: [facility] INVASIVE CV LAB;  Service: Cardiovascular;;  rt ext. iliac atherectomy and stent   PERIPHERAL VASCULAR CATHETERIZATION N/A 09/20/2015   Procedure: Lower Extremity Angiography;  Surgeon: Dorn PARAS Court, MD;  Location: [facility] INVASIVE CV LAB;  Service: Cardiovascular;  Laterality: N/A;   PERIPHERAL VASCULAR CATHETERIZATION Right 09/20/2015   Procedure: Peripheral Vascular Intervention;  Surgeon: Dorn PARAS Court, MD;  Location: [facility] INVASIVE CV LAB;  Service: Cardiovascular;  Laterality: Right;  SFA   ROTATOR CUFF REPAIR Left 7/15   Dr Rubie   WOUND EXPLORATION Left 12/17/2021   Procedure: LEFT LEG WOUND EXPLORATION AND WASH OUT WITH MYRIAD MORCELLS;  Surgeon: Lanis Fonda BRAVO, MD;  Location: [facility] OR;  Service: Vascular;  Laterality: Left;    MEDICATIONS:  Prior to Admission medications   Medication Sig Start Date End Date Taking? Authorizing Provider  aspirin  81 MG tablet Take 81 mg by mouth every evening.    [provider]  chlorthalidone  (HYGROTON ) 25 MG tablet TAKE 1/2 TABLET(12.5 MG) BY MOUTH DAILY 03/15/23   Alvan Dorn FALCON, MD  clopidogrel  (PLAVIX ) 75 MG tablet TAKE 1 TABLET(75 MG) BY MOUTH DAILY WITH BREAKFAST 11/24/22   Alvan Dorn FALCON, MD  Cyanocobalamin  (VITAMIN B-12) 500 MCG SUBL Place 500 mcg under the tongue daily.    [provider]  lenalidomide  (REVLIMID ) 5 MG capsule Take 1 capsule (5 mg  total) by mouth daily. TAKE 1 CAPSULE (5MG ) BY MOUTH EVERY DAY 05/10/23   Rogers Hai, MD  loperamide  (IMODIUM  A-D) 2 MG tablet Take 2 mg by mouth 4 (four) times daily as needed for diarrhea or loose stools.    [provider]  losartan  (COZAAR ) 25 MG tablet TAKE 1 TABLET(25 MG) BY MOUTH DAILY 02/01/23   Letvak, Richard I, MD  metoprolol  succinate (TOPROL -XL) 100 MG 24 hr tablet TAKE 1 TABLET(100 MG) BY MOUTH DAILY WITH OR IMMEDIATELY FOLLOWING A MEAL 01/01/23   Alvan Dorn FALCON, MD  nitroGLYCERIN  (NITROSTAT ) 0.4 MG SL tablet Place 1 tablet (0.4 mg total) under the tongue every 5 (five) minutes as needed for chest pain. 10/12/21   Court Dorn PARAS, MD  Omega-3 Fatty Acids (FISH OIL) 1000 MG CAPS Take 1,000 mg by mouth daily.    [provider]  omeprazole (PRILOSEC) 20 MG capsule Take 20 mg by mouth every other day.    [provider]  pravastatin  (PRAVACHOL ) 40 MG tablet TAKE 1 TABLET(40 MG) BY MOUTH DAILY 01/01/23   Jimmy Charlie FERNS,  MD  traMADol  (ULTRAM ) 50 MG tablet TAKE 1 TABLET BY MOUTH EVERY 8 HOURS AS NEEDED 04/16/23   Jimmy Charlie FERNS, MD    Physical Exam   Triage Vital Signs: ED Triage Vitals  Encounter Vitals Group     BP 05/18/23 2250 (!) 115/45     Systolic BP Percentile --      Diastolic BP Percentile --      Pulse Rate 05/18/23 2250 74     Resp 05/18/23 2250 20     Temp 05/18/23 2250 98.4 F (36.9 C)     Temp Source 05/18/23 2250 Oral     SpO2 05/18/23 2250 92 %     Weight 05/18/23 2251 195 lb (88.5 kg)     Height 05/18/23 2251 5' 7 (1.702 m)     Head Circumference --      Peak Flow --      Pain Score 05/18/23 2250 5     Pain Loc --      Pain Education --      Exclude from Growth Chart --     Most recent vital signs: Vitals:   05/19/23 0200 05/19/23 0230  BP: (!) 110/48 (!) 110/49  Pulse: 64 64  Resp:    Temp:    SpO2: 96% 97%    CONSTITUTIONAL: Alert, responds appropriately to questions. Well-appearing; well-nourished,  elderly HEAD: Normocephalic, atraumatic EYES: Conjunctivae clear, pupils appear equal, sclera nonicteric ENT: normal nose; moist mucous membranes NECK: Supple, normal ROM CARD: RRR; S1 and S2 appreciated RESP: Normal chest excursion without splinting or tachypnea; breath sounds clear and equal bilaterally; no wheezes, no rhonchi, no rales, no hypoxia or respiratory distress, speaking full sentences ABD/GI: Non-distended; soft, tender in the right lower quadrant, no hernia appreciated GU:  Normal external genitalia, circumcised male, normal penile shaft, no blood or discharge at the urethral meatus, no testicular masses or tenderness on exam, no scrotal masses or swelling, no hernias appreciated, 2+ femoral pulses bilaterally; no perineal erythema, warmth, subcutaneous air or crepitus; no high riding testicle  Chaperone present for exam. BACK: The back appears normal EXT: Normal ROM in all joints; no deformity noted, no edema, tender over the right inguinal area with normal femoral pulse and no soft tissue swelling, ecchymosis, redness or increased warmth.  He does have pain with flexion and internal/external rotation of the hip.  There is no leg length discrepancy.  I am able to Doppler a 2+ right DP and PT pulse.  No calf tenderness or calf swelling.  Compartments in the right leg are soft.  Normal cap refill in the right toes. SKIN: Normal color for age and race; warm; no rash on exposed skin NEURO: Moves all extremities equally, normal speech PSYCH: The patient's mood and manner are appropriate.   ED Results / Procedures / Treatments   LABS: (all labs ordered are listed, but only abnormal results are displayed) Labs Reviewed  COMPREHENSIVE METABOLIC PANEL - Abnormal; Notable for the following components:      Result Value   Sodium 124 (*)    Potassium 3.2 (*)    Chloride 90 (*)    Glucose, Bld 114 (*)    BUN 34 (*)    Creatinine, Ser 1.54 (*)    Calcium 8.4 (*)    Albumin  3.1 (*)     AST 107 (*)    ALT 69 (*)    GFR, Estimated 46 (*)    All other components within normal limits  URINALYSIS, COMPLETE (  UACMP) WITH MICROSCOPIC - Abnormal; Notable for the following components:   Color, Urine YELLOW (*)    APPearance HAZY (*)    Hgb urine dipstick SMALL (*)    Protein, ur 30 (*)    Bacteria, UA RARE (*)    All other components within normal limits  CBC - Abnormal; Notable for the following components:   WBC 2.3 (*)    RBC 2.12 (*)    Hemoglobin 7.7 (*)    HCT 22.8 (*)    MCV 107.5 (*)    MCH 36.3 (*)    RDW 18.7 (*)    Platelets 58 (*)    All other components within normal limits  RESP PANEL BY RT-PCR (RSV, FLU A&B, COVID)  [id number]  CULTURE, BLOOD (ROUTINE X 2)  CULTURE, BLOOD (ROUTINE X 2)     EKG:  EKG Interpretation Date/Time:    Ventricular Rate:    PR Interval:    QRS Duration:    QT Interval:    QTC Calculation:   R Axis:      Text Interpretation:           RADIOLOGY: My personal review and interpretation of imaging: No DVT.  CT scan shows right lower lobe pneumonia.  I have personally reviewed all radiology reports.   CT ABDOMEN PELVIS W CONTRAST Result Date: 05/19/2023 CLINICAL DATA:  Right groin pain x2 days. EXAM: CT ABDOMEN AND PELVIS WITH CONTRAST TECHNIQUE: Multidetector CT imaging of the abdomen and pelvis was performed using the standard protocol following bolus administration of intravenous contrast. RADIATION DOSE REDUCTION: This exam was performed according to the departmental dose-optimization program which includes automated exposure control, adjustment of the mA and/or kV according to patient size and/or use of iterative reconstruction technique. CONTRAST:  OMNIPAQUE  IOHEXOL  300 MG/ML  SOLN COMPARISON:  March 03, 2004 FINDINGS: Lower chest: A 5 mm pleural based lung nodule is seen within the anterior medial aspect of the right lower lobe (axial CT image 15, CT series 4). A 5 mm lung nodule is seen within the posterolateral  aspect of the left upper lobe (axial CT image 2, CT series 4). An additional 2 mm lung nodule is seen within the posterolateral aspect of the left lower lobe (axial CT image 15, CT series 4). Moderate to marked severity atelectasis and/or infiltrate is present within the posterior aspect of the right lower lobe. A 6.4 cm x 2.3 cm x 4.8 cm mass-like area of heterogeneous low attenuation is seen within the adjacent portion of the posteromedial right lung base. A very small right pleural effusion is noted. Hepatobiliary: No focal liver abnormality is seen. No gallstones, gallbladder wall thickening, or biliary dilatation. Pancreas: Unremarkable. No pancreatic ductal dilatation or surrounding inflammatory changes. Spleen: Normal in size without focal abnormality. Adrenals/Urinary Tract: Adrenal glands are unremarkable. Kidneys are normal, without renal calculi, focal lesion, or hydronephrosis. Bladder is unremarkable. Stomach/Bowel: Stomach is within normal limits. The appendix is not identified, with multiple surgical clips seen within the region posterior to the cecum. No evidence of bowel wall thickening, distention, or inflammatory changes. Small, noninflamed diverticula are seen throughout the sigmoid colon. Vascular/Lymphatic: Extensive aortic atherosclerosis with extensive calcification and atherosclerosis of the arterial structures of the pelvis and visualized lower extremities. A right common iliac artery stent is seen. Bypass graft material is seen along the left common femoral artery. No enlarged abdominal or pelvic lymph nodes. Reproductive: The prostate gland is mildly enlarged. Other: A 2.0 cm x 2.3 cm x  2.2 cm fat containing umbilical hernia is noted. No abdominopelvic ascites. Musculoskeletal: Marked severity multilevel degenerative changes seen throughout the lumbar spine. IMPRESSION: 1. Moderate to marked severity right lower lobe atelectasis and/or infiltrate with a mass-like area of heterogeneous low  attenuation within the adjacent portion of the posteromedial right lung base. While this may represent a focal area of necrotizing pneumonia, correlation with dedicated chest CT and follow-up to resolution is recommended to exclude the presence of an underlying neoplastic process. 2. Very small right pleural effusion. 3. Multiple bilateral lung nodules, as described above. Dedicated nonemergent chest CT is again recommended. 4. Sigmoid diverticulosis. 5. Small fat containing umbilical hernia. 6. 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 7. Aortic atherosclerosis. Aortic Atherosclerosis (ICD10-I70.0). Electronically Signed   By: Suzen Dials M.D.   On: 05/19/2023 03:25   CT Hip Right Wo Contrast Result Date: 05/19/2023 CLINICAL DATA:  Right groin pain. EXAM: CT OF THE RIGHT HIP WITHOUT CONTRAST TECHNIQUE: Multidetector CT imaging of the right hip was performed according to the standard protocol. Multiplanar CT image reconstructions were also generated. RADIATION DOSE REDUCTION: This exam was performed according to the departmental dose-optimization program which includes automated exposure control, adjustment of the mA and/or kV according to patient size and/or use of iterative reconstruction technique. COMPARISON:  None Available. FINDINGS: Bones/Joint/Cartilage There is no evidence of an acute fracture or dislocation. Mild degenerative changes are seen involving the right hip in the form of joint space narrowing, acetabular sclerosis and lateral acetabular bony spurring. Moderate severity degenerative changes are also seen along the visualized portion of the right sacroiliac joint. Ligaments Suboptimally assessed by CT. Muscles and Tendons Unremarkable. Soft tissues There is marked severity vascular calcification. Mild to moderate severity prostatomegaly is also seen. No hernia is identified. IMPRESSION: 1. No acute osseous abnormality. 2. Mild degenerative changes  involving the right hip. 3. Moderate severity degenerative changes along the visualized portion of the right sacroiliac joint. 4. Mild to moderate severity prostatomegaly. Electronically Signed   By: Suzen Dials M.D.   On: 05/19/2023 02:47   US  Venous Img Lower Unilateral Right Result Date: 05/19/2023 CLINICAL DATA:  cough, right leg pain EXAM: Right LOWER EXTREMITY VENOUS DOPPLER ULTRASOUND TECHNIQUE: Gray-scale sonography with compression, as well as color and duplex ultrasound, were performed to evaluate the deep venous system(s) from the level of the common femoral vein through the popliteal and proximal calf veins. COMPARISON:  None Available. FINDINGS: VENOUS Normal compressibility of the common femoral, superficial femoral, and popliteal veins, as well as the visualized calf veins. Visualized portions of profunda femoral vein and great saphenous vein unremarkable. No filling defects to suggest DVT on grayscale or color Doppler imaging. Doppler waveforms show normal direction of venous flow, normal respiratory plasticity and response to augmentation. Limited views of the contralateral common femoral vein are unremarkable. OTHER None. Limitations: none IMPRESSION: Negative. Electronically Signed   By: Morgane  Naveau M.D.   On: 05/19/2023 00:41   DG Chest 2 View Result Date: 05/19/2023 CLINICAL DATA:  cough, right leg pain EXAM: CHEST - 2 VIEW COMPARISON:  Chest x-ray 12/09/2021 FINDINGS: The heart and mediastinal contours are grossly unchanged given low lung volumes. Low lung volumes. No focal consolidation. No pulmonary edema. No pleural effusion. No pneumothorax. No acute osseous abnormality. IMPRESSION: Low lung volumes with no active cardiopulmonary disease. Electronically Signed   By: Morgane  Naveau M.D.   On: 05/19/2023 00:37     PROCEDURES:  Critical Care performed: Yes, see critical  care procedure note(s)   CRITICAL CARE Performed by: Josette Sink   Total critical care time:  40 minutes  Critical care time was exclusive of separately billable procedures and treating other patients.  Critical care was necessary to treat or prevent imminent or life-threatening deterioration.  Critical care was time spent personally by me on the following activities: development of treatment plan with patient and/or surrogate as well as nursing, discussions with consultants, evaluation of patient's response to treatment, examination of patient, obtaining history from patient or surrogate, ordering and performing treatments and interventions, ordering and review of laboratory studies, ordering and review of radiographic studies, pulse oximetry and re-evaluation of patient's condition.   Procedures    IMPRESSION / MDM / ASSESSMENT AND PLAN / ED COURSE  I reviewed the triage vital signs and the nursing notes.    Patient here with right lower abdominal pain, right inguinal pain, right hip pain.  The patient is on the cardiac monitor to evaluate for evidence of arrhythmia and/or significant heart rate changes.   DIFFERENTIAL DIAGNOSIS (includes but not limited to):   Kidney stone, UTI, pyelonephritis, colitis, diverticulitis, arthritis, less likely fracture, doubt septic joint.  No sign of testicular torsion, scrotal abscess or cellulitis, orchitis or epididymitis on exam.  No signs of critical limb ischemia.  Could be vascular claudication.  Wife concern for DVT.   Patient's presentation is most consistent with acute presentation with potential threat to life or bodily function.   PLAN: Labs obtained from triage show sodium of 124.  This looks like it has been slowly downtrending for the patient.  He also has chronic kidney disease which is stable.  Will give IV fluids.  Stable pancytopenia compared to 9 days ago.  Will obtain Doppler of the right leg to rule out DVT, CTs of the abdomen/pelvis and right hip.  Wife is also concerned about his cough.  He denies any chest pain or  shortness of breath.  Will obtain chest x-ray, COVID and flu swab.  Will also obtain urinalysis to rule out hematuria, infection.  Will give pain medication.   MEDICATIONS GIVEN IN ED: Medications  cefTRIAXone  (ROCEPHIN ) 2 g in sodium chloride  0.9 % 100 mL IVPB (has no administration in time range)  azithromycin  (ZITHROMAX ) 500 mg in sodium chloride  0.9 % 250 mL IVPB (has no administration in time range)  fentaNYL  (SUBLIMAZE ) injection 50 mcg (50 mcg Intravenous Given 05/19/23 0120)  ondansetron  (ZOFRAN ) injection 4 mg (4 mg Intravenous Given 05/19/23 0120)  sodium chloride  0.9 % bolus 500 mL (0 mLs Intravenous Stopped 05/19/23 0149)  iohexol  (OMNIPAQUE ) 300 MG/ML solution 100 mL (100 mLs Intravenous Contrast Given 05/19/23 0210)     ED COURSE: Patient is negative for COVID, flu and RSV.  Chest x-ray reviewed and interpreted by myself and the radiologist and is clear however CT of the abdomen pelvis shows a moderate to marked severity right lower lobe atelectasis versus infiltrate with right-sided pleural effusion.  Given his cough, I am concerned that this is infectious.  Will give ceftriaxone  and azithromycin  for coverage for community-acquired pneumonia.  No other acute abnormality seen on the CT of the abdomen pelvis or CT of the hip other than degenerative changes.  Venous Doppler shows no DVT.  Urine shows no sign of infection or blood.  Will admit patient for concerns for right lower lobe pneumonia and hyponatremia.   CONSULTS:  Consulted and discussed patient's case with hjospitalist, Dr. Lawence.  I have recommended admission and consulting physician  agrees and will place admission orders.  Patient (and family if present) agree with this plan.   I reviewed all nursing notes, vitals, pertinent previous records.  All labs, EKGs, imaging ordered have been independently reviewed and interpreted by myself.    OUTSIDE RECORDS REVIEWED: Reviewed last hematology oncology  notes.       FINAL CLINICAL IMPRESSION(S) / ED DIAGNOSES   Final diagnoses:  Right hip pain  Right lower quadrant abdominal pain  Hyponatremia  Pneumonia of right lower lobe due to infectious organism     Rx / DC Orders   ED Discharge Orders     None        Note:  This document was prepared using Dragon voice recognition software and may include unintentional dictation errors.   Dystany Duffy, Josette SAILOR, DO 05/19/23 [phone]

## 2023-05-19 ENCOUNTER — Other Ambulatory Visit: Payer: Medicare PPO

## 2023-05-19 ENCOUNTER — Emergency Department: Payer: Medicare PPO

## 2023-05-19 DIAGNOSIS — Z7982 Long term (current) use of aspirin: Secondary | ICD-10-CM | POA: Diagnosis not present

## 2023-05-19 DIAGNOSIS — E785 Hyperlipidemia, unspecified: Secondary | ICD-10-CM | POA: Diagnosis present

## 2023-05-19 DIAGNOSIS — D630 Anemia in neoplastic disease: Secondary | ICD-10-CM | POA: Diagnosis present

## 2023-05-19 DIAGNOSIS — M25551 Pain in right hip: Secondary | ICD-10-CM

## 2023-05-19 DIAGNOSIS — Z95820 Peripheral vascular angioplasty status with implants and grafts: Secondary | ICD-10-CM | POA: Diagnosis not present

## 2023-05-19 DIAGNOSIS — J850 Gangrene and necrosis of lung: Secondary | ICD-10-CM | POA: Diagnosis present

## 2023-05-19 DIAGNOSIS — J90 Pleural effusion, not elsewhere classified: Secondary | ICD-10-CM | POA: Diagnosis present

## 2023-05-19 DIAGNOSIS — N1831 Chronic kidney disease, stage 3a: Secondary | ICD-10-CM | POA: Diagnosis present

## 2023-05-19 DIAGNOSIS — I700 Atherosclerosis of aorta: Secondary | ICD-10-CM | POA: Diagnosis present

## 2023-05-19 DIAGNOSIS — M461 Sacroiliitis, not elsewhere classified: Secondary | ICD-10-CM | POA: Diagnosis present

## 2023-05-19 DIAGNOSIS — M1611 Unilateral primary osteoarthritis, right hip: Secondary | ICD-10-CM | POA: Diagnosis present

## 2023-05-19 DIAGNOSIS — D61818 Other pancytopenia: Secondary | ICD-10-CM | POA: Diagnosis present

## 2023-05-19 DIAGNOSIS — Z888 Allergy status to other drugs, medicaments and biological substances status: Secondary | ICD-10-CM | POA: Diagnosis not present

## 2023-05-19 DIAGNOSIS — E871 Hypo-osmolality and hyponatremia: Secondary | ICD-10-CM | POA: Diagnosis present

## 2023-05-19 DIAGNOSIS — J159 Unspecified bacterial pneumonia: Secondary | ICD-10-CM | POA: Diagnosis present

## 2023-05-19 DIAGNOSIS — Z8349 Family history of other endocrine, nutritional and metabolic diseases: Secondary | ICD-10-CM | POA: Diagnosis not present

## 2023-05-19 DIAGNOSIS — Z87891 Personal history of nicotine dependence: Secondary | ICD-10-CM | POA: Diagnosis not present

## 2023-05-19 DIAGNOSIS — Z8249 Family history of ischemic heart disease and other diseases of the circulatory system: Secondary | ICD-10-CM | POA: Diagnosis not present

## 2023-05-19 DIAGNOSIS — J189 Pneumonia, unspecified organism: Secondary | ICD-10-CM | POA: Diagnosis present

## 2023-05-19 DIAGNOSIS — D469 Myelodysplastic syndrome, unspecified: Secondary | ICD-10-CM | POA: Diagnosis present

## 2023-05-19 DIAGNOSIS — Z1152 Encounter for screening for COVID-19: Secondary | ICD-10-CM | POA: Diagnosis not present

## 2023-05-19 DIAGNOSIS — Z833 Family history of diabetes mellitus: Secondary | ICD-10-CM | POA: Diagnosis not present

## 2023-05-19 DIAGNOSIS — I2510 Atherosclerotic heart disease of native coronary artery without angina pectoris: Secondary | ICD-10-CM | POA: Diagnosis present

## 2023-05-19 DIAGNOSIS — E876 Hypokalemia: Secondary | ICD-10-CM | POA: Diagnosis present

## 2023-05-19 DIAGNOSIS — I129 Hypertensive chronic kidney disease with stage 1 through stage 4 chronic kidney disease, or unspecified chronic kidney disease: Secondary | ICD-10-CM | POA: Diagnosis present

## 2023-05-19 DIAGNOSIS — K219 Gastro-esophageal reflux disease without esophagitis: Secondary | ICD-10-CM | POA: Diagnosis present

## 2023-05-19 LAB — URINALYSIS, COMPLETE (UACMP) WITH MICROSCOPIC
Bilirubin Urine: NEGATIVE
Glucose, UA: NEGATIVE mg/dL
Ketones, ur: NEGATIVE mg/dL
Leukocytes,Ua: NEGATIVE
Nitrite: NEGATIVE
Protein, ur: 30 mg/dL — AB
Specific Gravity, Urine: 1.015 (ref 1.005–1.030)
pH: 5 (ref 5.0–8.0)

## 2023-05-19 LAB — CBC
HCT: 22.8 % — ABNORMAL LOW (ref 39.0–52.0)
Hemoglobin: 7.7 g/dL — ABNORMAL LOW (ref 13.0–17.0)
MCH: 36.3 pg — ABNORMAL HIGH (ref 26.0–34.0)
MCHC: 33.8 g/dL (ref 30.0–36.0)
MCV: 107.5 fL — ABNORMAL HIGH (ref 80.0–100.0)
Platelets: 58 10*3/uL — ABNORMAL LOW (ref 150–400)
RBC: 2.12 MIL/uL — ABNORMAL LOW (ref 4.22–5.81)
RDW: 18.7 % — ABNORMAL HIGH (ref 11.5–15.5)
WBC: 2.3 10*3/uL — ABNORMAL LOW (ref 4.0–10.5)
nRBC: 0 % (ref 0.0–0.2)

## 2023-05-19 LAB — RESP PANEL BY RT-PCR (RSV, FLU A&B, COVID)  RVPGX2
Influenza A by PCR: NEGATIVE
Influenza B by PCR: NEGATIVE
Resp Syncytial Virus by PCR: NEGATIVE
SARS Coronavirus 2 by RT PCR: NEGATIVE

## 2023-05-19 LAB — SODIUM: Sodium: 127 mmol/L — ABNORMAL LOW (ref 135–145)

## 2023-05-19 MED ORDER — AZITHROMYCIN 500 MG IV SOLR
500.0000 mg | Freq: Once | INTRAVENOUS | Status: AC
  Administered 2023-05-19: 500 mg via INTRAVENOUS
  Filled 2023-05-19: qty 5

## 2023-05-19 MED ORDER — SODIUM CHLORIDE 1 G PO TABS
1.0000 g | ORAL_TABLET | Freq: Two times a day (BID) | ORAL | Status: DC
  Administered 2023-05-19 – 2023-05-22 (×8): 1 g via ORAL
  Filled 2023-05-19 (×8): qty 1

## 2023-05-19 MED ORDER — PANTOPRAZOLE SODIUM 40 MG PO TBEC
40.0000 mg | DELAYED_RELEASE_TABLET | Freq: Every day | ORAL | Status: DC
  Administered 2023-05-19 – 2023-05-23 (×5): 40 mg via ORAL
  Filled 2023-05-19 (×5): qty 1

## 2023-05-19 MED ORDER — HYDROMORPHONE HCL 1 MG/ML IJ SOLN
0.5000 mg | INTRAMUSCULAR | Status: DC | PRN
Start: 2023-05-19 — End: 2023-05-23

## 2023-05-19 MED ORDER — SODIUM CHLORIDE 0.9 % IV BOLUS (SEPSIS)
500.0000 mL | Freq: Once | INTRAVENOUS | Status: AC
Start: 2023-05-19 — End: 2023-05-19
  Administered 2023-05-19: 500 mL via INTRAVENOUS

## 2023-05-19 MED ORDER — LOPERAMIDE HCL 2 MG PO CAPS: 2.0000 mg | ORAL_CAPSULE | Freq: Four times a day (QID) | ORAL | Status: DC | PRN

## 2023-05-19 MED ORDER — HYDRALAZINE HCL 20 MG/ML IJ SOLN: 5.0000 mg | Freq: Four times a day (QID) | INTRAMUSCULAR | Status: DC | PRN

## 2023-05-19 MED ORDER — SODIUM CHLORIDE 0.9 % IV SOLN
2.0000 g | Freq: Once | INTRAVENOUS | Status: AC
  Administered 2023-05-19: 2 g via INTRAVENOUS
  Filled 2023-05-19 (×2): qty 20

## 2023-05-19 MED ORDER — PRAVASTATIN SODIUM 20 MG PO TABS
40.0000 mg | ORAL_TABLET | Freq: Every day | ORAL | Status: DC
  Administered 2023-05-19 – 2023-05-22 (×4): 40 mg via ORAL
  Filled 2023-05-19 (×4): qty 2

## 2023-05-19 MED ORDER — TRAMADOL HCL 50 MG PO TABS: 50.0000 mg | ORAL_TABLET | Freq: Three times a day (TID) | ORAL | Status: DC | PRN

## 2023-05-19 MED ORDER — ONDANSETRON HCL 4 MG PO TABS: 4.0000 mg | ORAL_TABLET | Freq: Four times a day (QID) | ORAL | Status: DC | PRN

## 2023-05-19 MED ORDER — CLOPIDOGREL BISULFATE 75 MG PO TABS
75.0000 mg | ORAL_TABLET | Freq: Every day | ORAL | Status: DC
  Administered 2023-05-19 – 2023-05-21 (×3): 75 mg via ORAL
  Filled 2023-05-19 (×3): qty 1

## 2023-05-19 MED ORDER — IOHEXOL 300 MG/ML  SOLN
100.0000 mL | Freq: Once | INTRAMUSCULAR | Status: AC | PRN
  Administered 2023-05-19: 100 mL via INTRAVENOUS

## 2023-05-19 MED ORDER — SODIUM CHLORIDE 0.9 % IV SOLN: 2.0000 g | INTRAVENOUS | Status: DC

## 2023-05-19 MED ORDER — ONDANSETRON HCL 4 MG/2ML IJ SOLN: 4.0000 mg | Freq: Four times a day (QID) | INTRAMUSCULAR | Status: DC | PRN

## 2023-05-19 MED ORDER — ASPIRIN 81 MG PO TBEC
81.0000 mg | DELAYED_RELEASE_TABLET | Freq: Every evening | ORAL | Status: DC
  Administered 2023-05-19 – 2023-05-22 (×4): 81 mg via ORAL
  Filled 2023-05-19 (×4): qty 1

## 2023-05-19 MED ORDER — POTASSIUM CHLORIDE CRYS ER 20 MEQ PO TBCR: 40.0000 meq | EXTENDED_RELEASE_TABLET | ORAL | Status: DC

## 2023-05-19 MED ORDER — METOPROLOL TARTRATE 25 MG PO TABS
50.0000 mg | ORAL_TABLET | Freq: Two times a day (BID) | ORAL | Status: DC
Start: 2023-05-19 — End: 2023-05-19

## 2023-05-19 MED ORDER — SODIUM CHLORIDE 0.9 % IV SOLN
3.0000 g | Freq: Four times a day (QID) | INTRAVENOUS | Status: DC
  Administered 2023-05-19 – 2023-05-23 (×16): 3 g via INTRAVENOUS
  Filled 2023-05-19 (×17): qty 8

## 2023-05-19 MED ORDER — LENALIDOMIDE 5 MG PO CAPS: 5.0000 mg | ORAL_CAPSULE | Freq: Every day | ORAL | Status: DC

## 2023-05-19 MED ORDER — GUAIFENESIN ER 600 MG PO TB12
1200.0000 mg | ORAL_TABLET | Freq: Two times a day (BID) | ORAL | Status: DC
  Administered 2023-05-19 – 2023-05-23 (×8): 1200 mg via ORAL
  Filled 2023-05-19 (×11): qty 2

## 2023-05-19 MED ORDER — POTASSIUM CHLORIDE CRYS ER 20 MEQ PO TBCR
40.0000 meq | EXTENDED_RELEASE_TABLET | Freq: Once | ORAL | Status: AC
  Administered 2023-05-19: 40 meq via ORAL
  Filled 2023-05-19: qty 2

## 2023-05-19 MED ORDER — SODIUM CHLORIDE 0.9 % IV SOLN
500.0000 mg | INTRAVENOUS | Status: AC
  Administered 2023-05-20: 500 mg via INTRAVENOUS
  Filled 2023-05-19: qty 5

## 2023-05-19 NOTE — H&P (Signed)
 History and Physical    [name] FMW:[id number] DOB: 1946-03-06 DOA: 05/18/2023  PCP: Jimmy Charlie FERNS, MD (Confirm with patient/family/NH records and if not entered, this has to be entered at [facility] point of entry) Patient coming from: Home  I have personally briefly reviewed patient's old medical records in [facility]  Chief Complaint: Cough, SOB, right groin pain  HPI: Aaron Sexton is a 78 y.o. 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  Patient started to have a productive cough about 1 week ago, with occasional whitish phlegm production, denied any chest pain no fever or chills.  Denied any urinary symptoms such as runny nose sore throat, denied any sick contact.  Last few days patient has developed generalized weakness and 2 days ago he started to have severe right hip pain, located on the right groin area nonradiating, constant worsening with movement and cough, denied any radiation pain, denied any claudications.  Patient denied any trouble swallowing and no choke coughing after eating or drinking.  ED Course: Afebrile, no tachycardia borderline hypotensive, O2 saturation 96% on room air.  CT abdomen pelvis incidental finding of right lower lobe pneumonia, suspicious for necrotizing pneumonia with question of underlying mass.  Right hip CT showed mild OA, severe right-sided sacroiliac OA.  Blood work showed pancytopenia WBC 3.3, hemoglobin 7.7, platelet 58, sodium 124, K3.2, creatinine 1.5 bicarb 22.  Patient was started on ceftriaxone  and sisomicin in the ED.  Review of Systems: As per HPI otherwise 14 point review of systems negative.   Past Medical History:  Diagnosis  Date   Anemia    Arteriosclerotic cardiovascular disease (ASCVD) 1993   Critical RCA disease in 1993 treated with PTCA   Cerebrovascular disease    Moderate ASVD without focal stenosis in 01/2007   Clotting disorder (HCC)    Colon polyps 07/2010   single 2mm polyp--tubular adenoma   Diverticulosis 2012   found on colonoscopy   Erectile dysfunction    Family history of adverse reaction to anesthesia    difficult for son & sistor to wake    GERD (gastroesophageal reflux disease)    Hematochezia 05/24/2010   Hyperlipidemia    Hypertension    Osteoarthritis    knees/hands-Dr Lucey   Overweight(278.02)    Peripheral vascular disease (HCC) 03/11/2010   Moderate SFA stenosis; history of claudication   Pneumonia    Rosacea    Vitamin B12 deficiency     Past Surgical History:  Procedure Laterality Date   APPENDECTOMY     APPLICATION OF WOUND VAC Left 12/17/2021   Procedure: APPLICATION OF PREVENA WOUND VAC LEFT GROIN;  Surgeon: Lanis Fonda BRAVO, MD;  Location: [facility] OR;  Service: Vascular;  Laterality: Left;   APPLICATION OF WOUND VAC Left 01/08/2022   Procedure: APPLICATION OF WOUND VAC left groin.;  Surgeon: Gretta Lonni PARAS, MD;  Location: [facility] OR;  Service: Vascular;  Laterality: Left;   COLONOSCOPY W/ POLYPECTOMY  2012   ENDARTERECTOMY FEMORAL Left 12/09/2021   Procedure: LEFT COMMON FEMORAL ENDARTERECTOMY WITH 1 CM X 6 CM XENOSURE BOVINE PATCH ANGIOPLASTY;  Surgeon: Gretta Lonni PARAS, MD;  Location: [facility] OR;  Service: Vascular;  Laterality: Left;   FEMORAL-POPLITEAL BYPASS GRAFT Left 12/09/2021  Procedure: LEFT FEMORAL- ABOVE KNEE POPLITEAL BYPASS WITH 6 mm x 80 cm PROPATEN GRAFT;  Surgeon: Gretta Lonni PARAS, MD;  Location: [facility] OR;  Service: Vascular;  Laterality: Left;  INSERT ARTERIAL LINE   ILIAC VEIN ANGIOPLASTY / STENTING Right 08/09/2015   INCISION AND DRAINAGE OF WOUND Left 01/08/2022   Procedure: IRRIGATION AND DEBRIDEMENT GROIN LEFT application of Myriad Morcells.;  Surgeon:  Gretta Lonni PARAS, MD;  Location: [facility] OR;  Service: Vascular;  Laterality: Left;   INSERTION OF ILIAC STENT  12/09/2021   Procedure: INSERTION OF LEFT COMMON AND EXTERNAL  ILIAC STENTS WITH ANGIOPLASTY;  Surgeon: Gretta Lonni PARAS, MD;  Location: [facility] OR;  Service: Vascular;;   LOWER EXTREMITY ANGIOGRAM Left 12/09/2021   Procedure: LEFT ILIAC AND LOWER EXTREMITY ANGIOGRAM;  Surgeon: Gretta Lonni PARAS, MD;  Location: [facility] OR;  Service: Vascular;  Laterality: Left;   LOWER EXTREMITY ANGIOGRAPHY Bilateral 11/03/2021   Procedure: Lower Extremity Angiography;  Surgeon: Court Dorn PARAS, MD;  Location: [facility] INVASIVE CV LAB;  Service: Cardiovascular;  Laterality: Bilateral;  Limited Study   PERIPHERAL VASCULAR CATHETERIZATION Bilateral 06/21/2015   Procedure: Lower Extremity Angiography;  Surgeon: Dorn PARAS Court, MD;  Location: [facility] INVASIVE CV LAB;  Service: Cardiovascular;  Laterality: Bilateral;   PERIPHERAL VASCULAR CATHETERIZATION N/A 06/21/2015   Procedure: Abdominal Aortogram;  Surgeon: Dorn PARAS Court, MD;  Location: [facility] INVASIVE CV LAB;  Service: Cardiovascular;  Laterality: N/A;   PERIPHERAL VASCULAR CATHETERIZATION N/A 07/26/2015   Procedure: Lower Extremity Angiography;  Surgeon: Dorn PARAS Court, MD;  Location: [facility] INVASIVE CV LAB;  Service: Cardiovascular;  Laterality: N/A;   PERIPHERAL VASCULAR CATHETERIZATION N/A 08/09/2015   Procedure: Lower Extremity Angiography;  Surgeon: Dorn PARAS Court, MD;  Location: [facility] INVASIVE CV LAB;  Service: Cardiovascular;  Laterality: N/A;   PERIPHERAL VASCULAR CATHETERIZATION  08/09/2015   Procedure: Peripheral Vascular Intervention;  Surgeon: Dorn PARAS Court, MD;  Location: [facility] INVASIVE CV LAB;  Service: Cardiovascular;;  rt ext. iliac atherectomy and stent   PERIPHERAL VASCULAR CATHETERIZATION N/A 09/20/2015   Procedure: Lower Extremity Angiography;  Surgeon: Dorn PARAS Court, MD;  Location: [facility] INVASIVE CV LAB;  Service: Cardiovascular;  Laterality: N/A;    PERIPHERAL VASCULAR CATHETERIZATION Right 09/20/2015   Procedure: Peripheral Vascular Intervention;  Surgeon: Dorn PARAS Court, MD;  Location: [facility] INVASIVE CV LAB;  Service: Cardiovascular;  Laterality: Right;  SFA   ROTATOR CUFF REPAIR Left 7/15   Dr Rubie   WOUND EXPLORATION Left 12/17/2021   Procedure: LEFT LEG WOUND EXPLORATION AND WASH OUT WITH MYRIAD MORCELLS;  Surgeon: Lanis Fonda BRAVO, MD;  Location: [facility] OR;  Service: Vascular;  Laterality: Left;     reports that he quit smoking about 33 years ago. His smoking use included cigarettes. He started smoking about 63 years ago. He has a 30 pack-year smoking history. He has never been exposed to tobacco smoke. He has never used smokeless tobacco. He reports that he does not drink alcohol and does not use drugs.  Allergies  Allergen Reactions   Cardizem [Diltiazem Hcl] Hives and Rash    Family History  Problem Relation Age of Onset   Lung cancer Mother    Diabetes Mother    Hypertension Father        And siblings   Heart disease Father        And second-degree relatives   Transient ischemic attack Father    Brain cancer Sister    Heart disease Sister    Heart disease Sister  Thyroid disease Sister    Diabetes Brother    Atrial fibrillation Brother    Ulcers Son    Diabetes Son    Diabetes Son    Colon cancer Neg Hx    Stomach cancer Neg Hx    Rectal cancer Neg Hx      Prior to Admission medications   Medication Sig Start Date End Date Taking? Authorizing Provider  aspirin  81 MG tablet Take 81 mg by mouth every evening.   Yes [provider]  chlorthalidone  (HYGROTON ) 25 MG tablet TAKE 1/2 TABLET(12.5 MG) BY MOUTH DAILY 03/15/23  Yes Branch, Dorn FALCON, MD  clopidogrel  (PLAVIX ) 75 MG tablet TAKE 1 TABLET(75 MG) BY MOUTH DAILY WITH BREAKFAST 11/24/22  Yes Branch, Dorn FALCON, MD  Cyanocobalamin  (VITAMIN B-12) 500 MCG SUBL Place 500 mcg under the tongue daily.   Yes [provider]  lenalidomide  (REVLIMID ) 5 MG  capsule Take 1 capsule (5 mg total) by mouth daily. TAKE 1 CAPSULE (5MG ) BY MOUTH EVERY DAY 05/10/23  Yes Rogers Hai, MD  loperamide  (IMODIUM  A-D) 2 MG tablet Take 2 mg by mouth 4 (four) times daily as needed for diarrhea or loose stools.   Yes [provider]  losartan  (COZAAR ) 25 MG tablet TAKE 1 TABLET(25 MG) BY MOUTH DAILY 02/01/23  Yes Letvak, Richard I, MD  metoprolol  succinate (TOPROL -XL) 100 MG 24 hr tablet TAKE 1 TABLET(100 MG) BY MOUTH DAILY WITH OR IMMEDIATELY FOLLOWING A MEAL 01/01/23  Yes Branch, Dorn FALCON, MD  nitroGLYCERIN  (NITROSTAT ) 0.4 MG SL tablet Place 1 tablet (0.4 mg total) under the tongue every 5 (five) minutes as needed for chest pain. 10/12/21  Yes Court Dorn PARAS, MD  Omega-3 Fatty Acids (FISH OIL) 1000 MG CAPS Take 1,000 mg by mouth daily.   Yes [provider]  omeprazole (PRILOSEC) 20 MG capsule Take 20 mg by mouth every other day.   Yes [provider]  pravastatin  (PRAVACHOL ) 40 MG tablet TAKE 1 TABLET(40 MG) BY MOUTH DAILY 01/01/23  Yes Jimmy Ade I, MD  traMADol  (ULTRAM ) 50 MG tablet TAKE 1 TABLET BY MOUTH EVERY 8 HOURS AS NEEDED 04/16/23  Yes Jimmy Ade FERNS, MD    Physical Exam: Vitals:   05/19/23 0657 05/19/23 0701 05/19/23 0701 05/19/23 0814  BP:    (!) 108/47  Pulse: 64   65  Resp:    20  Temp:    99.6 F (37.6 C)  TempSrc:    Oral  SpO2: (!) 85% (!) 85% 94% 97%  Weight:      Height:        Constitutional: NAD, calm, comfortable Vitals:   05/19/23 0657 05/19/23 0701 05/19/23 0701 05/19/23 0814  BP:    (!) 108/47  Pulse: 64   65  Resp:    20  Temp:    99.6 F (37.6 C)  TempSrc:    Oral  SpO2: (!) 85% (!) 85% 94% 97%  Weight:      Height:       Eyes: PERRL, lids and conjunctivae normal ENMT: Mucous membranes are moist. Posterior pharynx clear of any exudate or lesions.Normal dentition.  Neck: normal, supple, no masses, no thyromegaly Respiratory: clear to auscultation bilaterally, no wheezing, coarse  crackles on right lower fields, increasing respiratory effort. No accessory muscle use.  Cardiovascular: Regular rate and rhythm, no murmurs / rubs / gallops. No extremity edema. 2+ pedal pulses. No carotid bruits.  Abdomen: no tenderness, no masses palpated. No hepatosplenomegaly. Bowel sounds positive.  Musculoskeletal: Tenderness on right hip area, significant reduced ROM, flexion to 90 degrees triggered significant right groin pain Skin: no rashes, lesions, ulcers. No induration Neurologic: CN 2-12 grossly intact. Sensation intact, DTR normal. Strength 5/5 in all 4.  Psychiatric: Normal judgment and insight. Alert and oriented x 3. Normal mood.     Labs on Admission: I have personally reviewed following labs and imaging studies  CBC: Recent Labs  Lab 05/18/23 2309  WBC 2.3*  HGB 7.7*  HCT 22.8*  MCV 107.5*  PLT 58*   Basic Metabolic Panel: Recent Labs  Lab 05/18/23 2309  NA 124*  K 3.2*  CL 90*  CO2 22  GLUCOSE 114*  BUN 34*  CREATININE 1.54*  CALCIUM 8.4*   GFR: Estimated Creatinine Clearance: 41.3 mL/min (A) (by C-G formula based on SCr of 1.54 mg/dL (H)). Liver Function Tests: Recent Labs  Lab 05/18/23 2309  AST 107*  ALT 69*  ALKPHOS 86  BILITOT 0.6  PROT 7.5  ALBUMIN  3.1*   No results for input(s): LIPASE, AMYLASE in the last 168 hours. No results for input(s): AMMONIA in the last 168 hours. Coagulation Profile: No results for input(s): INR, PROTIME in the last 168 hours. Cardiac Enzymes: No results for input(s): CKTOTAL, CKMB, CKMBINDEX, TROPONINI in the last 168 hours. BNP (last 3 results) No results for input(s): PROBNP in the last 8760 hours. HbA1C: No results for input(s): HGBA1C in the last 72 hours. CBG: No results for input(s): GLUCAP in the last 168 hours. Lipid Profile: No results for input(s): CHOL, HDL, LDLCALC, TRIG, CHOLHDL, LDLDIRECT in the last 72 hours. Thyroid Function Tests: No results for  input(s): TSH, T4TOTAL, FREET4, T3FREE, THYROIDAB in the last 72 hours. Anemia Panel: No results for input(s): VITAMINB12, FOLATE, FERRITIN, TIBC, IRON, RETICCTPCT in the last 72 hours. Urine analysis:    Component Value Date/Time   COLORURINE YELLOW (A) 05/18/2023 2306   APPEARANCEUR HAZY (A) 05/18/2023 2306   LABSPEC 1.015 05/18/2023 2306   PHURINE 5.0 05/18/2023 2306   GLUCOSEU NEGATIVE 05/18/2023 2306   HGBUR SMALL (A) 05/18/2023 2306   BILIRUBINUR NEGATIVE 05/18/2023 2306   KETONESUR NEGATIVE 05/18/2023 2306   PROTEINUR 30 (A) 05/18/2023 2306   NITRITE NEGATIVE 05/18/2023 2306   LEUKOCYTESUR NEGATIVE 05/18/2023 2306    Radiological Exams on Admission: CT ABDOMEN PELVIS W CONTRAST Result Date: 05/19/2023 CLINICAL DATA:  Right groin pain x2 days. EXAM: CT ABDOMEN AND PELVIS WITH CONTRAST TECHNIQUE: Multidetector CT imaging of the abdomen and pelvis was performed using the standard protocol following bolus administration of intravenous contrast. RADIATION DOSE REDUCTION: This exam was performed according to the departmental dose-optimization program which includes automated exposure control, adjustment of the mA and/or kV according to patient size and/or use of iterative reconstruction technique. CONTRAST:  OMNIPAQUE  IOHEXOL  300 MG/ML  SOLN COMPARISON:  March 03, 2004 FINDINGS: Lower chest: A 5 mm pleural based lung nodule is seen within the anterior medial aspect of the right lower lobe (axial CT image 15, CT series 4). A 5 mm lung nodule is seen within the posterolateral aspect of the left upper lobe (axial CT image 2, CT series 4). An additional 2 mm lung nodule is seen within the posterolateral aspect of the left lower lobe (axial CT image 15, CT series 4). Moderate to marked severity atelectasis and/or infiltrate is present within the posterior aspect of the right lower lobe. A 6.4 cm x 2.3 cm x 4.8 cm mass-like area of heterogeneous low attenuation is seen  within the adjacent portion of the posteromedial right lung base. A very small right pleural effusion is noted. Hepatobiliary: No focal liver abnormality is seen. No gallstones, gallbladder wall thickening, or biliary dilatation. Pancreas: Unremarkable. No pancreatic ductal dilatation or surrounding inflammatory changes. Spleen: Normal in size without focal abnormality. Adrenals/Urinary Tract: Adrenal glands are unremarkable. Kidneys are normal, without renal calculi, focal lesion, or hydronephrosis. Bladder is unremarkable. Stomach/Bowel: Stomach is within normal limits. The appendix is not identified, with multiple surgical clips seen within the region posterior to the cecum. No evidence of bowel wall thickening, distention, or inflammatory changes. Small, noninflamed diverticula are seen throughout the sigmoid colon. Vascular/Lymphatic: Extensive aortic atherosclerosis with extensive calcification and atherosclerosis of the arterial structures of the pelvis and visualized lower extremities. A right common iliac artery stent is seen. Bypass graft material is seen along the left common femoral artery. No enlarged abdominal or pelvic lymph nodes. Reproductive: The prostate gland is mildly enlarged. Other: A 2.0 cm x 2.3 cm x 2.2 cm fat containing umbilical hernia is noted. No abdominopelvic ascites. Musculoskeletal: Marked severity multilevel degenerative changes seen throughout the lumbar spine. IMPRESSION: 1. Moderate to marked severity right lower lobe atelectasis and/or infiltrate with a mass-like area of heterogeneous low attenuation within the adjacent portion of the posteromedial right lung base. While this may represent a focal area of necrotizing pneumonia, correlation with dedicated chest CT and follow-up to resolution is recommended to exclude the presence of an underlying neoplastic process. 2. Very small right pleural effusion. 3. Multiple bilateral lung nodules, as described above. Dedicated  nonemergent chest CT is again recommended. 4. Sigmoid diverticulosis. 5. Small fat containing umbilical hernia. 6. 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 7. Aortic atherosclerosis. Aortic Atherosclerosis (ICD10-I70.0). Electronically Signed   By: Suzen Dials M.D.   On: 05/19/2023 03:25   CT Hip Right Wo Contrast Result Date: 05/19/2023 CLINICAL DATA:  Right groin pain. EXAM: CT OF THE RIGHT HIP WITHOUT CONTRAST TECHNIQUE: Multidetector CT imaging of the right hip was performed according to the standard protocol. Multiplanar CT image reconstructions were also generated. RADIATION DOSE REDUCTION: This exam was performed according to the departmental dose-optimization program which includes automated exposure control, adjustment of the mA and/or kV according to patient size and/or use of iterative reconstruction technique. COMPARISON:  None Available. FINDINGS: Bones/Joint/Cartilage There is no evidence of an acute fracture or dislocation. Mild degenerative changes are seen involving the right hip in the form of joint space narrowing, acetabular sclerosis and lateral acetabular bony spurring. Moderate severity degenerative changes are also seen along the visualized portion of the right sacroiliac joint. Ligaments Suboptimally assessed by CT. Muscles and Tendons Unremarkable. Soft tissues There is marked severity vascular calcification. Mild to moderate severity prostatomegaly is also seen. No hernia is identified. IMPRESSION: 1. No acute osseous abnormality. 2. Mild degenerative changes involving the right hip. 3. Moderate severity degenerative changes along the visualized portion of the right sacroiliac joint. 4. Mild to moderate severity prostatomegaly. Electronically Signed   By: Suzen Dials M.D.   On: 05/19/2023 02:47   US  Venous Img Lower Unilateral Right Result Date: 05/19/2023 CLINICAL DATA:  cough, right leg pain EXAM: Right LOWER EXTREMITY VENOUS DOPPLER  ULTRASOUND TECHNIQUE: Gray-scale sonography with compression, as well as color and duplex ultrasound, were performed to evaluate the deep venous system(s) from the level of the common femoral vein through the popliteal and proximal calf veins. COMPARISON:  None Available. FINDINGS: VENOUS Normal compressibility of the common femoral,  superficial femoral, and popliteal veins, as well as the visualized calf veins. Visualized portions of profunda femoral vein and great saphenous vein unremarkable. No filling defects to suggest DVT on grayscale or color Doppler imaging. Doppler waveforms show normal direction of venous flow, normal respiratory plasticity and response to augmentation. Limited views of the contralateral common femoral vein are unremarkable. OTHER None. Limitations: none IMPRESSION: Negative. Electronically Signed   By: Morgane  Naveau M.D.   On: 05/19/2023 00:41   DG Chest 2 View Result Date: 05/19/2023 CLINICAL DATA:  cough, right leg pain EXAM: CHEST - 2 VIEW COMPARISON:  Chest x-ray 12/09/2021 FINDINGS: The heart and mediastinal contours are grossly unchanged given low lung volumes. Low lung volumes. No focal consolidation. No pulmonary edema. No pleural effusion. No pneumothorax. No acute osseous abnormality. IMPRESSION: Low lung volumes with no active cardiopulmonary disease. Electronically Signed   By: Morgane  Naveau M.D.   On: 05/19/2023 00:37    EKG: Ordered  Assessment/Plan Principal Problem:   Community acquired pneumonia Active Problems:   Right hip pain   PNA (pneumonia)  (please populate well all problems here in Problem List. (For example, if patient is on BP meds at home and you resume or decide to hold them, it is a problem that needs to be her. Same for CAD, COPD, HLD and so on)  Right lower lobe necrotizing pneumonia -Will treat as CAP, silent aspiration cannot be ruled out as patient is quite deconditioning -Coverage for both CAP and aspiration pneumonia, Unasyn  plus  azithromycin  -Check sputum culture, atypical antigen study -Incentive spirometry and flutter valve -With evidence of possible underlying mass on the right lower lobe, recommend repeat CAT scan in 4 weeks to document resolution of pneumonia -As per recommendation from pharmacy, will hold off current chemotherapy of Revlimid   Right groin pain Acute ambulation impairment -Likely from right hip joint OA and/or the right sacroiliac OA -Symptomatic management -PT evaluation -Other DDx, with no claudication and most recent TBI/ABI study 4 months ago showed normal result, low suspicion for PVD related etiologies such as ischemia limb  Hyponatremia -Euvolemic, likely secondary to chlorthalidone  -Discontinue chlorthalidone  and recheck sodium level tonight and tomorrow morning  Hypokalemia -P.o. 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 Consults called: None Admission status: Tele admit   Cort ONEIDA Mana MD [facility] Pager [phone]  05/19/2023, 9:26 AM

## 2023-05-19 NOTE — ED Notes (Signed)
 Rocephin pulled from pyxis. It would not reconstitute. Pharmacy called for replacement. They will be sending replacement

## 2023-05-19 NOTE — Consult Note (Signed)
 Pharmacy Antibiotic Note  Aaron Sexton is a 78 y.o. male admitted on 05/18/2023 with pneumonia.  Patient currently treated with Revlimid  for MDS. ANC is 0.9 1/2. Currently low white count 2.5. Afebrile on admission. Patient already received 1 dose ceftriaxone  and 1 dose of zithromax  this admission for concern for pneumonia. Imaging CT chest showed no focal consolidation.CT of the abdomen pelvis shows a moderate to marked severity right lower lobe atelectasis versus infiltrate with right-sided pleural effusion. Pharmacy has been consulted for Unasyn  dosing. Pharmacy has been consulted to dose Unasyn  for concern for aspiration pneumonia.   Plan: Start 3g Unasyn  IV Q6H  Scr 1.54; CrCl 41.3 mL/min Monitor for renal adjustment if CrCl < 30 mL/min Follow culture data and monitor renal function  Height: 5' 7 (170.2 cm) Weight: 88.5 kg (195 lb) IBW/kg (Calculated) : 66.1  Temp (24hrs), Avg:98.6 F (37 C), Min:97.7 F (36.5 C), Max:99.6 F (37.6 C)  Recent Labs  Lab 05/18/23 2309  WBC 2.3*  CREATININE 1.54*    Estimated Creatinine Clearance: 41.3 mL/min (A) (by C-G formula based on SCr of 1.54 mg/dL (H)).    Allergies  Allergen Reactions   Cardizem [Diltiazem Hcl] Hives and Rash    Antimicrobials this admission: 1/11 Ceftriaxone  x 1 1/11 Azithromycin  x 1 1/11 Unasyn  >>   Dose adjustments this admission: NA  Microbiology results: 1/11 BCx: pending  Thank you for allowing pharmacy to be a part of this patient's care.  Alfonso MARLA Buys, PharmD Pharmacy Resident  05/19/2023 9:16 AM

## 2023-05-20 DIAGNOSIS — J189 Pneumonia, unspecified organism: Secondary | ICD-10-CM | POA: Diagnosis not present

## 2023-05-20 LAB — CBC
HCT: 22.6 % — ABNORMAL LOW (ref 39.0–52.0)
Hemoglobin: 7.6 g/dL — ABNORMAL LOW (ref 13.0–17.0)
MCH: 36.5 pg — ABNORMAL HIGH (ref 26.0–34.0)
MCHC: 33.6 g/dL (ref 30.0–36.0)
MCV: 108.7 fL — ABNORMAL HIGH (ref 80.0–100.0)
Platelets: 55 10*3/uL — ABNORMAL LOW (ref 150–400)
RBC: 2.08 MIL/uL — ABNORMAL LOW (ref 4.22–5.81)
RDW: 18.7 % — ABNORMAL HIGH (ref 11.5–15.5)
WBC: 2 10*3/uL — ABNORMAL LOW (ref 4.0–10.5)
nRBC: 0 % (ref 0.0–0.2)

## 2023-05-20 LAB — BASIC METABOLIC PANEL WITH GFR
Anion gap: 9 (ref 5–15)
BUN: 25 mg/dL — ABNORMAL HIGH (ref 8–23)
CO2: 24 mmol/L (ref 22–32)
Calcium: 8.3 mg/dL — ABNORMAL LOW (ref 8.9–10.3)
Chloride: 95 mmol/L — ABNORMAL LOW (ref 98–111)
Creatinine, Ser: 1.47 mg/dL — ABNORMAL HIGH (ref 0.61–1.24)
GFR, Estimated: 48 mL/min — ABNORMAL LOW
Glucose, Bld: 93 mg/dL (ref 70–99)
Potassium: 3.4 mmol/L — ABNORMAL LOW (ref 3.5–5.1)
Sodium: 128 mmol/L — ABNORMAL LOW (ref 135–145)

## 2023-05-20 MED ORDER — AZITHROMYCIN 500 MG PO TABS
500.0000 mg | ORAL_TABLET | Freq: Every day | ORAL | Status: DC
  Administered 2023-05-21 – 2023-05-23 (×3): 500 mg via ORAL
  Filled 2023-05-20 (×3): qty 1

## 2023-05-20 MED ORDER — ACETAMINOPHEN 325 MG PO TABS
650.0000 mg | ORAL_TABLET | Freq: Four times a day (QID) | ORAL | Status: DC | PRN
  Administered 2023-05-20: 650 mg via ORAL
  Filled 2023-05-20: qty 2

## 2023-05-20 NOTE — Progress Notes (Signed)
 OT Cancellation Note  Patient Details Name: Aaron Sexton MRN: [id number] DOB: 08/28/1945   Cancelled Treatment:    Reason Eval/Treat Not Completed: OT screened, no needs identified, will sign off. Order received, chart reviewed. Per conversation with PT, pt and spouse report back to baseline functional independence. No skilled OT needs identified. Will sign off. Please re-consult if additional needs arise.   [name], M.S. OTR/L  05/20/23, 9:44 AM  ascom [phone]

## 2023-05-20 NOTE — Progress Notes (Signed)
 PROGRESS NOTE    Aaron Sexton  FMW:[id number] DOB: August 29, 1945 DOA: 05/18/2023 PCP: Jimmy Charlie FERNS, MD  Chief Complaint  Patient presents with   Groin Pain    Hospital Course:  Aaron Sexton is 78 y.o. 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  On arrival to the ED CT abdomen pelvis revealed right lower lobe pneumonia suspicious for necrotizing pneumonia with questionable underlying mass.  Right hip CT showed mild OA, severe right-sided sacroiliac OA.  Blood work revealed pancytopenia with a WBC of 3.3, platelets of 58, hemoglobin 7.7, and sodium of 124.  He was started on ceftriaxone  and sisomicin  Subjective: This morning patient reports he is feeling a lot better.  Still has occasional cough but overall has more energy and feels like he is returning to his baseline.   Objective: Vitals:   05/20/23 0550 05/20/23 0915 05/20/23 1254 05/20/23 1255  BP:  (!) 150/54  (!) 164/62  Pulse:  89  84  Resp:  (!) 24  (!) 24  Temp: 98 F (36.7 C)  98 F (36.7 C)   TempSrc: Oral  Oral   SpO2: 98% 97%  95%  Weight:      Height:        Intake/Output Summary (Last 24 hours) at 05/20/2023 1328 Last data filed at 05/20/2023 9561 Gross per 24 hour  Intake 200 ml  Output --  Net 200 ml   Filed Weights   05/18/23 2251  Weight: 88.5 kg    Examination: General exam: Appears calm and comfortable, NAD Respiratory system: No work of breathing, symmetric chest wall expansion Cardiovascular system: S1 & S2 heard, RRR.  Gastrointestinal system: Abdomen is nondistended, soft and nontender.  Neuro: Alert and oriented. No focal neurological deficits. Extremities: Symmetric, expected ROM Skin: No rashes, lesions Psychiatry: Demonstrates appropriate  judgement and insight. Mood & affect appropriate for situation.   Assessment & Plan:  Principal Problem:   Community acquired pneumonia Active Problems:   Right hip pain   PNA (pneumonia)    Right lower lobe necrotizing pneumonia - Treat as community-acquired pneumonia now - Cannot rule out silent aspiration as he is quite deconditioned - Continue with Unasyn  and azithromycin  - Sputum culture sent with atypical antigens - Continue incentive spirometry and flutter valve - Given patient's history of malignancy and CT findings, will need to repeat CT scan in 4 weeks to document resolution of this pneumonia and evaluate for underlying mass - Admitting MD has discussed with pharmacy, will hold off on chemotherapy Revlimid  for now given current infection  Right groin pain Acute ambulation impairment - 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  PT score: 22, would benefit from outpatient physical therapy - Does have recent TBI/ABI study 4 months ago with minimal PAD.  Hyponatremia - Likely secondary to chlorthalidone  which she is taking outpatient - Sodium improving now - Avoid diuretics at discharge  Hypokalemia - Replace as needed  Hypertension - Will restart gradually given low Bps on arrival  Peripheral vascular disease - Continue Plavix  and statin  Pancytopenia Chronic anemia Myelodysplastic syndrome - Patient is actively on chemotherapy at home, will hold Revlimid  while he is admitted - Needs follow-up closely with outpatient oncology - Hold chemical DVT prophylaxis due to severe thrombocytopenia  CKD stage IIIa - Appears to be at baseline creatinine 1.4 - Avoid nephrotoxic meds - Renally dose when needed with creatinine clearance 43     DVT prophylaxis: SCDs   Code Status: Full Code Family Communication: Daughter in-law at bedside Disposition:  Status is: Inpatient     Consultants:      Procedures:     Antimicrobials:  Anti-infectives (From admission, onward)    Start     Dose/Rate Route Frequency Ordered Stop   05/21/23 1000  azithromycin  (ZITHROMAX ) tablet 500 mg        500 mg Oral Daily 05/20/23 1121     05/20/23 1000  cefTRIAXone  (ROCEPHIN ) 2 g in sodium chloride  0.9 % 100 mL IVPB  Status:  Discontinued        2 g 200 mL/hr over 30 Minutes Intravenous Every 24 hours 05/19/23 0858 05/19/23 0901   05/20/23 1000  azithromycin  (ZITHROMAX ) 500 mg in sodium chloride  0.9 % 250 mL IVPB        500 mg 250 mL/hr over 60 Minutes Intravenous Every 24 hours 05/19/23 0859 05/20/23 2359   05/19/23 1600  Ampicillin -Sulbactam (UNASYN ) 3 g in sodium chloride  0.9 % 100 mL IVPB        3 g 200 mL/hr over 30 Minutes Intravenous Every 6 hours 05/19/23 0907     05/19/23 0330  cefTRIAXone  (ROCEPHIN ) 2 g in sodium chloride  0.9 % 100 mL IVPB        2 g 200 mL/hr over 30 Minutes Intravenous  Once 05/19/23 0329 05/19/23 0546   05/19/23 0330  azithromycin  (ZITHROMAX ) 500 mg in sodium chloride  0.9 % 250 mL IVPB        500 mg 250 mL/hr over 60 Minutes Intravenous  Once 05/19/23 0329 05/19/23 9385       Data Reviewed: I have personally reviewed following labs and imaging studies CBC: Recent Labs  Lab 05/18/23 2309 05/20/23 0611  WBC 2.3* 2.0*  HGB 7.7* 7.6*  HCT 22.8* 22.6*  MCV 107.5* 108.7*  PLT 58* 55*   Basic Metabolic Panel: Recent Labs  Lab 05/18/23 2309 05/19/23 1611 05/20/23 0611  NA 124* 127* 128*  K 3.2*  --  3.4*  CL 90*  --  95*  CO2 22  --  24  GLUCOSE 114*  --  93  BUN 34*  --  25*  CREATININE 1.54*  --  1.47*  CALCIUM 8.4*  --  8.3*   GFR: Estimated Creatinine Clearance: 43.3 mL/min (A) (by C-G formula based on SCr of 1.47 mg/dL (H)). Liver Function Tests: Recent Labs  Lab 05/18/23 2309  AST 107*  ALT 69*  ALKPHOS 86  BILITOT 0.6  PROT 7.5  ALBUMIN  3.1*   CBG: No results for input(s): GLUCAP in the last 168 hours.  Recent Results (from the past 240  hours)  Resp panel by RT-PCR (RSV, Flu A&B, Covid) Anterior Nasal Swab     Status: None   Collection Time: 05/19/23  1:23 AM   Specimen: Anterior Nasal Swab  Result Value Ref Range Status   SARS Coronavirus 2 by RT PCR NEGATIVE NEGATIVE Final    Comment: (NOTE) SARS-CoV-2 target nucleic acids are NOT DETECTED.  The SARS-CoV-2 RNA is generally detectable in upper respiratory specimens during the acute phase of infection. The lowest concentration of SARS-CoV-2 viral copies this assay can detect is 138 copies/mL. A negative result does not preclude SARS-Cov-2 infection and should not be used as the sole basis for treatment or other  patient management decisions. A negative result may occur with  improper specimen collection/handling, submission of specimen other than nasopharyngeal swab, presence of viral mutation(s) within the areas targeted by this assay, and inadequate number of viral copies(<138 copies/mL). A negative result must be combined with clinical observations, patient history, and epidemiological information. The expected result is Negative.  Fact Sheet for Patients:  bloggercourse.com  Fact Sheet for Healthcare Providers:  seriousbroker.it  This test is no t yet approved or cleared by the [place]  FDA and  has been authorized for detection and/or diagnosis of SARS-CoV-2 by FDA under an Emergency Use Authorization (EUA). This EUA will remain  in effect (meaning this test can be used) for the duration of the COVID-19 declaration under Section 564(b)(1) of the Act, 21 U.S.C.section 360bbb-3(b)(1), unless the authorization is terminated  or revoked sooner.       Influenza A by PCR NEGATIVE NEGATIVE Final   Influenza B by PCR NEGATIVE NEGATIVE Final    Comment: (NOTE) The Xpert Xpress SARS-CoV-2/FLU/RSV plus assay is intended as an aid in the diagnosis of influenza from Nasopharyngeal swab specimens and should not  be used as a sole basis for treatment. Nasal washings and aspirates are unacceptable for Xpert Xpress SARS-CoV-2/FLU/RSV testing.  Fact Sheet for Patients: bloggercourse.com  Fact Sheet for Healthcare Providers: seriousbroker.it  This test is not yet approved or cleared by the [place]  FDA and has been authorized for detection and/or diagnosis of SARS-CoV-2 by FDA under an Emergency Use Authorization (EUA). This EUA will remain in effect (meaning this test can be used) for the duration of the COVID-19 declaration under Section 564(b)(1) of the Act, 21 U.S.C. section 360bbb-3(b)(1), unless the authorization is terminated or revoked.     Resp Syncytial Virus by PCR NEGATIVE NEGATIVE Final    Comment: (NOTE) Fact Sheet for Patients: bloggercourse.com  Fact Sheet for Healthcare Providers: seriousbroker.it  This test is not yet approved or cleared by the [place]  FDA and has been authorized for detection and/or diagnosis of SARS-CoV-2 by FDA under an Emergency Use Authorization (EUA). This EUA will remain in effect (meaning this test can be used) for the duration of the COVID-19 declaration under Section 564(b)(1) of the Act, 21 U.S.C. section 360bbb-3(b)(1), unless the authorization is terminated or revoked.  Performed at [facility], [address] [place], [place] [address]   Blood culture (routine x 2)     Status: None (Preliminary result)   Collection Time: 05/19/23  4:35 AM   Specimen: BLOOD RIGHT HAND  Result Value Ref Range Status   Specimen Description BLOOD RIGHT HAND  Final   Special Requests   Final    BOTTLES DRAWN AEROBIC AND ANAEROBIC Blood Culture results may not be optimal due to an inadequate volume of blood received in culture bottles   Culture   Final    NO GROWTH 1 DAY Performed at [facility], [address]  [place], [place] [address]    Report Status PENDING  Incomplete  Blood culture (routine x 2)     Status: None (Preliminary result)   Collection Time: 05/19/23  4:36 AM   Specimen: BLOOD RIGHT ARM  Result Value Ref Range Status   Specimen Description BLOOD RIGHT ARM  Final   Special Requests   Final    BOTTLES DRAWN AEROBIC AND ANAEROBIC Blood Culture results may not be optimal due to an inadequate volume of blood received in culture bottles   Culture   Final  NO GROWTH < 24 HOURS Performed at [facility], [address], [place], [place] [address]    Report Status PENDING  Incomplete     Radiology Studies: CT ABDOMEN PELVIS W CONTRAST Result Date: 05/19/2023 CLINICAL DATA:  Right groin pain x2 days. EXAM: CT ABDOMEN AND PELVIS WITH CONTRAST TECHNIQUE: Multidetector CT imaging of the abdomen and pelvis was performed using the standard protocol following bolus administration of intravenous contrast. RADIATION DOSE REDUCTION: This exam was performed according to the departmental dose-optimization program which includes automated exposure control, adjustment of the mA and/or kV according to patient size and/or use of iterative reconstruction technique. CONTRAST:  OMNIPAQUE  IOHEXOL  300 MG/ML  SOLN COMPARISON:  March 03, 2004 FINDINGS: Lower chest: A 5 mm pleural based lung nodule is seen within the anterior medial aspect of the right lower lobe (axial CT image 15, CT series 4). A 5 mm lung nodule is seen within the posterolateral aspect of the left upper lobe (axial CT image 2, CT series 4). An additional 2 mm lung nodule is seen within the posterolateral aspect of the left lower lobe (axial CT image 15, CT series 4). Moderate to marked severity atelectasis and/or infiltrate is present within the posterior aspect of the right lower lobe. A 6.4 cm x 2.3 cm x 4.8 cm mass-like area of heterogeneous low attenuation is seen within the adjacent portion of the posteromedial right lung base. A  very small right pleural effusion is noted. Hepatobiliary: No focal liver abnormality is seen. No gallstones, gallbladder wall thickening, or biliary dilatation. Pancreas: Unremarkable. No pancreatic ductal dilatation or surrounding inflammatory changes. Spleen: Normal in size without focal abnormality. Adrenals/Urinary Tract: Adrenal glands are unremarkable. Kidneys are normal, without renal calculi, focal lesion, or hydronephrosis. Bladder is unremarkable. Stomach/Bowel: Stomach is within normal limits. The appendix is not identified, with multiple surgical clips seen within the region posterior to the cecum. No evidence of bowel wall thickening, distention, or inflammatory changes. Small, noninflamed diverticula are seen throughout the sigmoid colon. Vascular/Lymphatic: Extensive aortic atherosclerosis with extensive calcification and atherosclerosis of the arterial structures of the pelvis and visualized lower extremities. A right common iliac artery stent is seen. Bypass graft material is seen along the left common femoral artery. No enlarged abdominal or pelvic lymph nodes. Reproductive: The prostate gland is mildly enlarged. Other: A 2.0 cm x 2.3 cm x 2.2 cm fat containing umbilical hernia is noted. No abdominopelvic ascites. Musculoskeletal: Marked severity multilevel degenerative changes seen throughout the lumbar spine. IMPRESSION: 1. Moderate to marked severity right lower lobe atelectasis and/or infiltrate with a mass-like area of heterogeneous low attenuation within the adjacent portion of the posteromedial right lung base. While this may represent a focal area of necrotizing pneumonia, correlation with dedicated chest CT and follow-up to resolution is recommended to exclude the presence of an underlying neoplastic process. 2. Very small right pleural effusion. 3. Multiple bilateral lung nodules, as described above. Dedicated nonemergent chest CT is again recommended. 4. Sigmoid diverticulosis. 5. Small  fat containing umbilical hernia. 6. 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 7. Aortic atherosclerosis. Aortic Atherosclerosis (ICD10-I70.0). Electronically Signed   By: Suzen Dials M.D.   On: 05/19/2023 03:25   CT Hip Right Wo Contrast Result Date: 05/19/2023 CLINICAL DATA:  Right groin pain. EXAM: CT OF THE RIGHT HIP WITHOUT CONTRAST TECHNIQUE: Multidetector CT imaging of the right hip was performed according to the standard protocol. Multiplanar CT image reconstructions were also generated. RADIATION DOSE REDUCTION: This  exam was performed according to the departmental dose-optimization program which includes automated exposure control, adjustment of the mA and/or kV according to patient size and/or use of iterative reconstruction technique. COMPARISON:  None Available. FINDINGS: Bones/Joint/Cartilage There is no evidence of an acute fracture or dislocation. Mild degenerative changes are seen involving the right hip in the form of joint space narrowing, acetabular sclerosis and lateral acetabular bony spurring. Moderate severity degenerative changes are also seen along the visualized portion of the right sacroiliac joint. Ligaments Suboptimally assessed by CT. Muscles and Tendons Unremarkable. Soft tissues There is marked severity vascular calcification. Mild to moderate severity prostatomegaly is also seen. No hernia is identified. IMPRESSION: 1. No acute osseous abnormality. 2. Mild degenerative changes involving the right hip. 3. Moderate severity degenerative changes along the visualized portion of the right sacroiliac joint. 4. Mild to moderate severity prostatomegaly. Electronically Signed   By: Suzen Dials M.D.   On: 05/19/2023 02:47   US  Venous Img Lower Unilateral Right Result Date: 05/19/2023 CLINICAL DATA:  cough, right leg pain EXAM: Right LOWER EXTREMITY VENOUS DOPPLER ULTRASOUND TECHNIQUE: Gray-scale sonography with compression, as well as color  and duplex ultrasound, were performed to evaluate the deep venous system(s) from the level of the common femoral vein through the popliteal and proximal calf veins. COMPARISON:  None Available. FINDINGS: VENOUS Normal compressibility of the common femoral, superficial femoral, and popliteal veins, as well as the visualized calf veins. Visualized portions of profunda femoral vein and great saphenous vein unremarkable. No filling defects to suggest DVT on grayscale or color Doppler imaging. Doppler waveforms show normal direction of venous flow, normal respiratory plasticity and response to augmentation. Limited views of the contralateral common femoral vein are unremarkable. OTHER None. Limitations: none IMPRESSION: Negative. Electronically Signed   By: Morgane  Naveau M.D.   On: 05/19/2023 00:41   DG Chest 2 View Result Date: 05/19/2023 CLINICAL DATA:  cough, right leg pain EXAM: CHEST - 2 VIEW COMPARISON:  Chest x-ray 12/09/2021 FINDINGS: The heart and mediastinal contours are grossly unchanged given low lung volumes. Low lung volumes. No focal consolidation. No pulmonary edema. No pleural effusion. No pneumothorax. No acute osseous abnormality. IMPRESSION: Low lung volumes with no active cardiopulmonary disease. Electronically Signed   By: Morgane  Naveau M.D.   On: 05/19/2023 00:37    Scheduled Meds:  aspirin  EC  81 mg Oral QPM   [START ON 05/21/2023] azithromycin   500 mg Oral Daily   clopidogrel   75 mg Oral Daily   guaiFENesin   1,200 mg Oral BID   pantoprazole   40 mg Oral Daily   pravastatin   40 mg Oral q1800   sodium chloride   1 g Oral BID WC   Continuous Infusions:  ampicillin -sulbactam (UNASYN ) IV Stopped (05/20/23 0438)   azithromycin        LOS: 1 day    Time spent:  55min  Lavonn Maxcy, DO [facility]  To contact the attending physician between 7A-7P please use Epic Chat. To contact the covering physician during after hours 7P-7A, please review Amion.   05/20/2023, 1:28  PM   *This document has been created with the assistance of dictation software. Please excuse typographical errors. *

## 2023-05-20 NOTE — Evaluation (Signed)
 Physical Therapy Evaluation Patient Details Name: Aaron Sexton MRN: [id number] DOB: Aug 02, 1945 Today's Date: 05/20/2023  History of Present Illness  Aaron Sexton is a 77yoM who [facility] worsening productive cough and acute Rt hip pain. Workup revealing of right lower lobe pneumonia. 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  Clinical Impression   At time of PT evaluation, pt very much back to his baseline for basic mobility, albeit with some short distance AMB limitation which is within the continuum of baseline fluctuations per his reports. Pt is satting at 98% on room air. DIL in room at end of session. No recommended PT services at DC, no DME needs.       If plan is discharge home, recommend the following: A little help with walking and/or transfers;Help with stairs or ramp for entrance   Can travel by private vehicle        Equipment Recommendations None recommended by PT  Recommendations for Other Services       Functional Status Assessment Patient has had a recent decline in their functional status and demonstrates the ability to make significant improvements in function in a reasonable and predictable amount of time.     Precautions / Restrictions Precautions Precautions: Fall Restrictions Weight Bearing Restrictions Per Provider Order: No      Mobility  Bed Mobility Overal bed mobility: Modified Independent                  Transfers Overall transfer level: Modified independent Equipment used: Rolling walker (2 wheels)                    Ambulation/Gait Ambulation/Gait assistance: Supervision, Contact guard assist Gait Distance (Feet): 60 Feet Assistive device: Rolling walker (2 wheels) Gait Pattern/deviations: WFL(Within Functional Limits), Step-to pattern       General  Gait Details: slow, steady confident; is limited bybilat knee weakness/stiffness, which is a fluctuating and chronic problem.  Stairs            Wheelchair Mobility     Tilt Bed    Modified Rankin (Stroke Patients Only)       Balance Overall balance assessment: Modified Independent                                           Pertinent Vitals/Pain Pain Assessment Pain Assessment: No/denies pain    Home Living Family/patient expects to be discharged to:: Private residence Living Arrangements: Spouse/significant other Available Help at Discharge: Family;Available 24 hours/day Type of Home: House Home Access: Stairs to enter Entrance Stairs-Rails: Can reach both Entrance Stairs-Number of Steps: 2   Home Layout: One level Home Equipment: Rollator (4 wheels);Rolling Walker (2 wheels);Grab bars - toilet;Grab bars - tub/shower      Prior Function Prior Level of Function : Independent/Modified Independent;Driving             Mobility Comments: uses an upwalker for past couple years; ADLs Comments: modI     Extremity/Trunk Assessment                Communication      Cognition Arousal: Alert Behavior During Therapy: WFL for tasks assessed/performed Overall Cognitive Status: Within Functional Limits for tasks assessed  General Comments      Exercises     Assessment/Plan    PT Assessment Patient needs continued PT services  PT Problem List Decreased activity tolerance       PT Treatment Interventions DME instruction;Patient/family education;Gait training;Functional mobility training;Therapeutic activities;Therapeutic exercise;Balance training    PT Goals (Current goals can be found in the Care Plan section)  Acute Rehab PT Goals Patient Stated Goal: get strnegth back PT Goal Formulation: With patient Time For Goal Achievement: 06/03/23 Potential to Achieve Goals:  Good    Frequency Min 1X/week     Co-evaluation               AM-PAC PT 6 Clicks Mobility  Outcome Measure Help needed turning from your back to your side while in a flat bed without using bedrails?: None Help needed moving from lying on your back to sitting on the side of a flat bed without using bedrails?: None Help needed moving to and from a bed to a chair (including a wheelchair)?: None Help needed standing up from a chair using your arms (e.g., wheelchair or bedside chair)?: None Help needed to walk in hospital room?: A Little Help needed climbing 3-5 steps with a railing? : A Little 6 Click Score: 22    End of Session Equipment Utilized During Treatment: Oxygen Activity Tolerance: Patient tolerated treatment well;No increased pain Patient left: in bed;with call bell/phone within reach;with family/visitor present Nurse Communication: Mobility status PT Visit Diagnosis: Other abnormalities of gait and mobility (R26.89);Muscle weakness (generalized) (M62.81)    Time: 9141-9083 PT Time Calculation (min) (ACUTE ONLY): 18 min   Charges:   PT Evaluation $PT Eval Low Complexity: 1 Low   PT General Charges $$ ACUTE PT VISIT: 1 Visit        11:50 AM, 05/20/23 Peggye JAYSON Linear, PT, DPT Physical Therapist - [facility]  [phone] (ASCOM)    Larue Drawdy C 05/20/2023, 11:48 AM

## 2023-05-21 ENCOUNTER — Inpatient Hospital Stay: Payer: Medicare PPO

## 2023-05-21 ENCOUNTER — Encounter: Payer: Self-pay | Admitting: Internal Medicine

## 2023-05-21 DIAGNOSIS — J189 Pneumonia, unspecified organism: Secondary | ICD-10-CM | POA: Diagnosis not present

## 2023-05-21 LAB — CBC WITH DIFFERENTIAL/PLATELET
Abs Immature Granulocytes: 0.03 10*3/uL (ref 0.00–0.07)
Basophils Absolute: 0 10*3/uL (ref 0.0–0.1)
Basophils Relative: 0 %
Eosinophils Absolute: 0 10*3/uL (ref 0.0–0.5)
Eosinophils Relative: 2 %
HCT: 22.5 % — ABNORMAL LOW (ref 39.0–52.0)
Hemoglobin: 7.7 g/dL — ABNORMAL LOW (ref 13.0–17.0)
Immature Granulocytes: 1 %
Lymphocytes Relative: 31 %
Lymphs Abs: 0.7 10*3/uL (ref 0.7–4.0)
MCH: 36.2 pg — ABNORMAL HIGH (ref 26.0–34.0)
MCHC: 34.2 g/dL (ref 30.0–36.0)
MCV: 105.6 fL — ABNORMAL HIGH (ref 80.0–100.0)
Monocytes Absolute: 0.3 10*3/uL (ref 0.1–1.0)
Monocytes Relative: 12 %
Neutro Abs: 1.2 10*3/uL — ABNORMAL LOW (ref 1.7–7.7)
Neutrophils Relative %: 54 %
Platelets: 54 10*3/uL — ABNORMAL LOW (ref 150–400)
RBC: 2.13 MIL/uL — ABNORMAL LOW (ref 4.22–5.81)
RDW: 18.8 % — ABNORMAL HIGH (ref 11.5–15.5)
Smear Review: NORMAL
WBC: 2.3 10*3/uL — ABNORMAL LOW (ref 4.0–10.5)
nRBC: 0 % (ref 0.0–0.2)

## 2023-05-21 LAB — COMPREHENSIVE METABOLIC PANEL
ALT: 91 U/L — ABNORMAL HIGH (ref 0–44)
AST: 94 U/L — ABNORMAL HIGH (ref 15–41)
Albumin: 2.6 g/dL — ABNORMAL LOW (ref 3.5–5.0)
Alkaline Phosphatase: 129 U/L — ABNORMAL HIGH (ref 38–126)
Anion gap: 11 (ref 5–15)
BUN: 21 mg/dL (ref 8–23)
CO2: 24 mmol/L (ref 22–32)
Calcium: 8.1 mg/dL — ABNORMAL LOW (ref 8.9–10.3)
Chloride: 97 mmol/L — ABNORMAL LOW (ref 98–111)
Creatinine, Ser: 1.26 mg/dL — ABNORMAL HIGH (ref 0.61–1.24)
GFR, Estimated: 58 mL/min — ABNORMAL LOW (ref >60)
Glucose, Bld: 94 mg/dL (ref 70–99)
Potassium: 3.2 mmol/L — ABNORMAL LOW (ref 3.5–5.1)
Sodium: 132 mmol/L — ABNORMAL LOW (ref 135–145)
Total Bilirubin: 0.9 mg/dL (ref 0.0–1.2)
Total Protein: 6.9 g/dL (ref 6.5–8.1)

## 2023-05-21 LAB — PHOSPHORUS: Phosphorus: 3.1 mg/dL (ref 2.5–4.6)

## 2023-05-21 LAB — MYCOPLASMA PNEUMONIAE ANTIBODY, IGM: Mycoplasma pneumo IgM: <770 U/mL (ref 0–769)

## 2023-05-21 LAB — MAGNESIUM: Magnesium: 2.1 mg/dL (ref 1.7–2.4)

## 2023-05-21 MED ORDER — POTASSIUM CHLORIDE CRYS ER 20 MEQ PO TBCR
40.0000 meq | EXTENDED_RELEASE_TABLET | Freq: Once | ORAL | Status: AC
Start: 2023-05-21 — End: 2023-05-21
  Administered 2023-05-21: 40 meq via ORAL
  Filled 2023-05-21: qty 2

## 2023-05-21 MED ORDER — IOHEXOL 350 MG/ML SOLN
75.0000 mL | Freq: Once | INTRAVENOUS | Status: AC | PRN
  Administered 2023-05-21: 75 mL via INTRAVENOUS

## 2023-05-21 NOTE — Progress Notes (Signed)
 PROGRESS NOTE    Aaron Sexton  FMW:[id number] DOB: 11-09-1945 DOA: 05/18/2023 PCP: Jimmy Charlie FERNS, MD  Chief Complaint  Patient presents with   Groin Pain    Hospital Course:  Aaron Sexton is 78 y.o. 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  On arrival to the ED CT abdomen pelvis revealed right lower lobe pneumonia suspicious for necrotizing pneumonia with questionable underlying mass.  Right hip CT showed mild OA, severe right-sided sacroiliac OA.  Blood work revealed pancytopenia with a WBC of 3.3, platelets of 58, hemoglobin 7.7, and sodium of 124.  He was started on ceftriaxone  and sisomicin  Subjective: Patient with worsening tachypnea this morning.  Requiring 2 to 3 L of oxygen.  He reports he feels well.  Still endorsing occasional cough.  Objective: Vitals:   05/21/23 1300 05/21/23 1433 05/21/23 1500 05/21/23 1600  BP: (!) 130/54  (!) 132/53 (!) 137/58  Pulse: 89  81 87  Resp: (!) 32   19  Temp:  97.6 F (36.4 C)  97.9 F (36.6 C)  TempSrc:  Oral  Oral  SpO2: 95%  96% 95%  Weight:      Height:        Intake/Output Summary (Last 24 hours) at 05/21/2023 1721 Last data filed at 05/20/2023 2321 Gross per 24 hour  Intake 200 ml  Output --  Net 200 ml   Filed Weights   05/18/23 2251  Weight: 88.5 kg    Examination: General exam: Appears calm and comfortable, NAD Respiratory system: Tachypnea, chest wall expansion, shallow breaths. Cardiovascular system: S1 & S2 heard, RRR.  Gastrointestinal system: Abdomen is nondistended, soft and nontender.  Neuro: Alert and oriented. No focal neurological deficits. Extremities: Symmetric, expected ROM Skin: No rashes, lesions Psychiatry: Demonstrates appropriate judgement and  insight. Mood & affect appropriate for situation.   Assessment & Plan:  Principal Problem:   Community acquired pneumonia Active Problems:   Right hip pain   PNA (pneumonia)    Right lower lobe necrotizing pneumonia - Given worsening tachypnea and hypoxia today have performed CTA.  CT negative for pulmonary embolism but does reveal pulmonary nodules, lymphadenopathy, and growing right-sided pleural effusion.  This may all be secondary to pneumonia, however cannot rule out malignancy. - Consult IR to perform thoracentesis tomorrow.  Discussed with the patient and his wife at bedside.  Will follow cytology/pathology, cell counts and cultures - Hold Plavix  - N.p.o. 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  PT score: 22, would benefit from outpatient physical therapy - Does have recent TBI/ABI study 4 months ago with minimal PAD.  Hyponatremia - Likely secondary to chlorthalidone  which he is taking outpatient - Sodium improving now - Avoid diuretics at discharge  Hypokalemia - Replace as needed  Hypertension - Will restart gradually given low Bps on arrival  Peripheral vascular disease - Continue statin and aspirin  for now.  Hold Plavix  in light  of upcoming thoracentesis  Pancytopenia Chronic anemia Myelodysplastic syndrome - Patient is actively on chemotherapy at home, will hold Revlimid  while he is admitted - Needs follow-up closely with outpatient oncology - Hold chemical DVT prophylaxis due to severe thrombocytopenia - Continue to trend hemoglobin,  currently stable at 7.7.  Transfuse if he falls below 7  CKD stage IIIa - Appears to be at baseline creatinine 1.4 - Avoid nephrotoxic meds - Renally dose when needed with creatinine clearance 43     DVT prophylaxis: SCDs   Code Status: Full Code Family Communication: Have discussed with wife at bedside and his daughter on the phone. Disposition:  Status is: Inpatient     Consultants:      Procedures:    Antimicrobials:  Anti-infectives (From admission, onward)    Start     Dose/Rate Route Frequency Ordered Stop   05/21/23 1000  azithromycin  (ZITHROMAX ) tablet 500 mg        500 mg Oral Daily 05/20/23 1121     05/20/23 1000  cefTRIAXone  (ROCEPHIN ) 2 g in sodium chloride  0.9 % 100 mL IVPB  Status:  Discontinued        2 g 200 mL/hr over 30 Minutes Intravenous Every 24 hours 05/19/23 0858 05/19/23 0901   05/20/23 1000  azithromycin  (ZITHROMAX ) 500 mg in sodium chloride  0.9 % 250 mL IVPB        500 mg 250 mL/hr over 60 Minutes Intravenous Every 24 hours 05/19/23 0859 05/20/23 1758   05/19/23 1600  Ampicillin -Sulbactam (UNASYN ) 3 g in sodium chloride  0.9 % 100 mL IVPB        3 g 200 mL/hr over 30 Minutes Intravenous Every 6 hours 05/19/23 0907     05/19/23 0330  cefTRIAXone  (ROCEPHIN ) 2 g in sodium chloride  0.9 % 100 mL IVPB        2 g 200 mL/hr over 30 Minutes Intravenous  Once 05/19/23 0329 05/19/23 0546   05/19/23 0330  azithromycin  (ZITHROMAX ) 500 mg in sodium chloride  0.9 % 250 mL IVPB        500 mg 250 mL/hr over 60 Minutes Intravenous  Once 05/19/23 0329 05/19/23 9385       Data Reviewed: I have personally reviewed following labs and imaging studies CBC: Recent Labs  Lab 05/18/23 2309 05/20/23 0611 05/21/23 0653  WBC 2.3* 2.0* 2.3*  NEUTROABS  --   --  1.2*  HGB 7.7* 7.6* 7.7*  HCT 22.8* 22.6* 22.5*  MCV 107.5* 108.7* 105.6*  PLT 58* 55* 54*   Basic Metabolic Panel: Recent Labs  Lab 05/18/23 2309 05/19/23 1611 05/20/23 0611 05/21/23 0653  NA 124*  127* 128* 132*  K 3.2*  --  3.4* 3.2*  CL 90*  --  95* 97*  CO2 22  --  24 24  GLUCOSE 114*  --  93 94  BUN 34*  --  25* 21  CREATININE 1.54*  --  1.47* 1.26*  CALCIUM 8.4*  --  8.3* 8.1*  MG  --   --   --  2.1  PHOS  --   --   --  3.1   GFR: Estimated Creatinine Clearance: 50.5 mL/min (A) (by C-G formula based on SCr of 1.26 mg/dL (H)). Liver Function Tests: Recent Labs  Lab 05/18/23 2309 05/21/23 0653  AST 107* 94*  ALT 69* 91*  ALKPHOS 86 129*  BILITOT 0.6 0.9  PROT 7.5 6.9  ALBUMIN  3.1* 2.6*   CBG: No results for input(s): GLUCAP in the last  168 hours.  Recent Results (from the past 240 hours)  Resp panel by RT-PCR (RSV, Flu A&B, Covid) Anterior Nasal Swab     Status: None   Collection Time: 05/19/23  1:23 AM   Specimen: Anterior Nasal Swab  Result Value Ref Range Status   SARS Coronavirus 2 by RT PCR NEGATIVE NEGATIVE Final    Comment: (NOTE) SARS-CoV-2 target nucleic acids are NOT DETECTED.  The SARS-CoV-2 RNA is generally detectable in upper respiratory specimens during the acute phase of infection. The lowest concentration of SARS-CoV-2 viral copies this assay can detect is 138 copies/mL. A negative result does not preclude SARS-Cov-2 infection and should not be used as the sole basis for treatment or other patient management decisions. A negative result may occur with  improper specimen collection/handling, submission of specimen other than nasopharyngeal swab, presence of viral mutation(s) within the areas targeted by this assay, and inadequate number of viral copies(<138 copies/mL). A negative result must be combined with clinical observations, patient history, and epidemiological information. The expected result is Negative.  Fact Sheet for Patients:  bloggercourse.com  Fact Sheet for Healthcare Providers:  seriousbroker.it  This test is no t yet approved or cleared by the [place]  FDA and   has been authorized for detection and/or diagnosis of SARS-CoV-2 by FDA under an Emergency Use Authorization (EUA). This EUA will remain  in effect (meaning this test can be used) for the duration of the COVID-19 declaration under Section 564(b)(1) of the Act, 21 U.S.C.section 360bbb-3(b)(1), unless the authorization is terminated  or revoked sooner.       Influenza A by PCR NEGATIVE NEGATIVE Final   Influenza B by PCR NEGATIVE NEGATIVE Final    Comment: (NOTE) The Xpert Xpress SARS-CoV-2/FLU/RSV plus assay is intended as an aid in the diagnosis of influenza from Nasopharyngeal swab specimens and should not be used as a sole basis for treatment. Nasal washings and aspirates are unacceptable for Xpert Xpress SARS-CoV-2/FLU/RSV testing.  Fact Sheet for Patients: bloggercourse.com  Fact Sheet for Healthcare Providers: seriousbroker.it  This test is not yet approved or cleared by the [place]  FDA and has been authorized for detection and/or diagnosis of SARS-CoV-2 by FDA under an Emergency Use Authorization (EUA). This EUA will remain in effect (meaning this test can be used) for the duration of the COVID-19 declaration under Section 564(b)(1) of the Act, 21 U.S.C. section 360bbb-3(b)(1), unless the authorization is terminated or revoked.     Resp Syncytial Virus by PCR NEGATIVE NEGATIVE Final    Comment: (NOTE) Fact Sheet for Patients: bloggercourse.com  Fact Sheet for Healthcare Providers: seriousbroker.it  This test is not yet approved or cleared by the [place]  FDA and has been authorized for detection and/or diagnosis of SARS-CoV-2 by FDA under an Emergency Use Authorization (EUA). This EUA will remain in effect (meaning this test can be used) for the duration of the COVID-19 declaration under Section 564(b)(1) of the Act, 21 U.S.C. section 360bbb-3(b)(1),  unless the authorization is terminated or revoked.  Performed at [facility], [address] [place], [place] [address]   Blood culture (routine x 2)     Status: None (Preliminary result)   Collection Time: 05/19/23  4:35 AM   Specimen: BLOOD RIGHT HAND  Result Value Ref Range Status   Specimen Description BLOOD RIGHT HAND  Final   Special Requests   Final    BOTTLES DRAWN AEROBIC AND ANAEROBIC Blood Culture results may not be optimal due to an inadequate volume  of blood received in culture bottles   Culture   Final    NO GROWTH 2 DAYS Performed at [facility], [address] [place], [place] [address]    Report Status PENDING  Incomplete  Blood culture (routine x 2)     Status: None (Preliminary result)   Collection Time: 05/19/23  4:36 AM   Specimen: BLOOD RIGHT ARM  Result Value Ref Range Status   Specimen Description BLOOD RIGHT ARM  Final   Special Requests   Final    BOTTLES DRAWN AEROBIC AND ANAEROBIC Blood Culture results may not be optimal due to an inadequate volume of blood received in culture bottles   Culture   Final    NO GROWTH 2 DAYS Performed at [facility], [address] [place], [place] [address]    Report Status PENDING  Incomplete     Radiology Studies: CT Angio Chest Pulmonary Embolism (PE) W or WO Contrast Result Date: 05/21/2023 CLINICAL DATA:  Worsening shortness of breath and tachypnea this morning. Pulmonary embolism suspected. Possible necrotizing pneumonia. EXAM: CT ANGIOGRAPHY CHEST WITH CONTRAST TECHNIQUE: Multidetector CT imaging of the chest was performed using the standard protocol during bolus administration of intravenous contrast. Multiplanar CT image reconstructions and MIPs were obtained to evaluate the vascular anatomy. RADIATION DOSE REDUCTION: This exam was performed according to the departmental dose-optimization program which includes automated exposure control, adjustment of the mA and/or kV according  to patient size and/or use of iterative reconstruction technique. CONTRAST:  75mL OMNIPAQUE  IOHEXOL  350 MG/ML SOLN COMPARISON:  Chest radiographs and abdominal CT 05/19/2023. No prior chest CT. FINDINGS: Cardiovascular: The pulmonary arteries are well opacified with contrast to the level of the segmental branches. There is no evidence of acute pulmonary embolism. No acute systemic arterial abnormalities are identified. There is atherosclerosis of the aorta, great vessels and coronary arteries. The heart size is normal. There is no pericardial effusion. Mediastinum/Nodes: Mildly enlarged right paratracheal node measures 1.5 cm short axis on image 114/5. There are prominent right hilar lymph nodes measuring up to 1 cm in diameter. There is right pericardiac nodularity which may reflect small lymph nodes or pleural base nodules. No other enlarged mediastinal or axillary lymph nodes are seen. The thyroid gland, trachea and esophagus demonstrate no significant findings. Lungs/Pleura: Small dependent right pleural effusion has increased in volume compared with the recent abdominal CT. Interval increased volume loss in the right lower lobe with associated consolidation or ill-defined mass, slightly increased from the recent prior study. There are scattered small pulmonary nodules bilaterally which are primarily subpleural in location, including a 6 mm right middle lobe nodule on image 81/6, a 6 mm lingular nodule on image 82/6 and a 7 mm left lower lobe nodule on image 100/6, unchanged from recent abdominal CT. Additional scattered nodules are present measuring up to 6 mm anteriorly in the left upper lobe on image 37/6. Upper abdomen: No acute findings are seen in the visualized upper abdomen. Musculoskeletal/Chest wall: There is no chest wall mass or suspicious osseous finding. Mild multilevel spondylosis. Review of the MIP images confirms the above findings. IMPRESSION: 1. No evidence of acute pulmonary embolism or other  acute vascular findings in the chest. 2. Interval increased volume loss in the right lower lobe with associated consolidation and enlarging adjacent pleural effusion, suspicious for pneumonia. Radiographic follow-up recommended to ensure resolution. 3. Scattered small pulmonary nodules bilaterally, primarily subpleural in location, nonspecific. Metastatic disease not excluded. Non-contrast chest CT at 3-6 months is  recommended. If the nodules are stable at time of repeat CT, then future CT at 18-24 months (from today's scan) is considered optional for low-risk patients, but is recommended for high-risk patients. This recommendation follows the consensus statement: Guidelines for Management of Incidental Pulmonary Nodules Detected on CT Images: From the Fleischner Society 2017; Radiology 2017; 284:228-243. 4. Mildly enlarged right paratracheal and right hilar lymph nodes, also nonspecific and potentially reactive. Recommend attention on follow-up CT. 5.  Aortic Atherosclerosis (ICD10-I70.0). Electronically Signed   By: Elsie Perone M.D.   On: 05/21/2023 11:39    Scheduled Meds:  aspirin  EC  81 mg Oral QPM   azithromycin   500 mg Oral Daily   guaiFENesin   1,200 mg Oral BID   pantoprazole   40 mg Oral Daily   pravastatin   40 mg Oral q1800   sodium chloride   1 g Oral BID WC   Continuous Infusions:  ampicillin -sulbactam (UNASYN ) IV Stopped (05/21/23 1650)     LOS: 2 days    Time spent:  55min  Yocelin Vanlue, DO [facility]  To contact the attending physician between 7A-7P please use Epic Chat. To contact the covering physician during after hours 7P-7A, please review Amion.   05/21/2023, 5:21 PM   *This document has been created with the assistance of dictation software. Please excuse typographical errors. *

## 2023-05-22 ENCOUNTER — Inpatient Hospital Stay: Payer: Medicare PPO

## 2023-05-22 DIAGNOSIS — M25551 Pain in right hip: Secondary | ICD-10-CM | POA: Diagnosis not present

## 2023-05-22 DIAGNOSIS — J189 Pneumonia, unspecified organism: Secondary | ICD-10-CM | POA: Diagnosis not present

## 2023-05-22 LAB — CBC WITH DIFFERENTIAL/PLATELET
Abs Immature Granulocytes: 0.05 10*3/uL (ref 0.00–0.07)
Basophils Absolute: 0 10*3/uL (ref 0.0–0.1)
Basophils Relative: 1 %
Eosinophils Absolute: 0.1 10*3/uL (ref 0.0–0.5)
Eosinophils Relative: 2 %
HCT: 21.2 % — ABNORMAL LOW (ref 39.0–52.0)
Hemoglobin: 7.2 g/dL — ABNORMAL LOW (ref 13.0–17.0)
Immature Granulocytes: 2 %
Lymphocytes Relative: 39 %
Lymphs Abs: 1.2 10*3/uL (ref 0.7–4.0)
MCH: 36 pg — ABNORMAL HIGH (ref 26.0–34.0)
MCHC: 34 g/dL (ref 30.0–36.0)
MCV: 106 fL — ABNORMAL HIGH (ref 80.0–100.0)
Monocytes Absolute: 0.2 10*3/uL (ref 0.1–1.0)
Monocytes Relative: 8 %
Neutro Abs: 1.6 10*3/uL — ABNORMAL LOW (ref 1.7–7.7)
Neutrophils Relative %: 48 %
Platelets: 59 10*3/uL — ABNORMAL LOW (ref 150–400)
RBC: 2 MIL/uL — ABNORMAL LOW (ref 4.22–5.81)
RDW: 19 % — ABNORMAL HIGH (ref 11.5–15.5)
Smear Review: NORMAL
WBC: 3.1 10*3/uL — ABNORMAL LOW (ref 4.0–10.5)
nRBC: 0 % (ref 0.0–0.2)

## 2023-05-22 LAB — COMPREHENSIVE METABOLIC PANEL
ALT: 110 U/L — ABNORMAL HIGH (ref 0–44)
AST: 118 U/L — ABNORMAL HIGH (ref 15–41)
Albumin: 2.4 g/dL — ABNORMAL LOW (ref 3.5–5.0)
Alkaline Phosphatase: 138 U/L — ABNORMAL HIGH (ref 38–126)
Anion gap: 8 (ref 5–15)
BUN: 22 mg/dL (ref 8–23)
CO2: 25 mmol/L (ref 22–32)
Calcium: 8.2 mg/dL — ABNORMAL LOW (ref 8.9–10.3)
Chloride: 100 mmol/L (ref 98–111)
Creatinine, Ser: 1.28 mg/dL — ABNORMAL HIGH (ref 0.61–1.24)
GFR, Estimated: 57 mL/min — ABNORMAL LOW (ref >60)
Glucose, Bld: 95 mg/dL (ref 70–99)
Potassium: 3.4 mmol/L — ABNORMAL LOW (ref 3.5–5.1)
Sodium: 133 mmol/L — ABNORMAL LOW (ref 135–145)
Total Bilirubin: 0.5 mg/dL (ref 0.0–1.2)
Total Protein: 6.8 g/dL (ref 6.5–8.1)

## 2023-05-22 LAB — PHOSPHORUS: Phosphorus: 3.4 mg/dL (ref 2.5–4.6)

## 2023-05-22 LAB — LEGIONELLA PNEUMOPHILA SEROGP 1 UR AG: L. pneumophila Serogp 1 Ur Ag: NEGATIVE

## 2023-05-22 LAB — MAGNESIUM: Magnesium: 2.1 mg/dL (ref 1.7–2.4)

## 2023-05-22 MED ORDER — CLOPIDOGREL BISULFATE 75 MG PO TABS
75.0000 mg | ORAL_TABLET | Freq: Every day | ORAL | Status: DC
  Administered 2023-05-22 – 2023-05-23 (×2): 75 mg via ORAL
  Filled 2023-05-22 (×2): qty 1

## 2023-05-22 NOTE — Progress Notes (Signed)
 Patient presents for  therapeutic and diagnostic thoracentesis. US  limited chest  shows small amount of pleural fluid. After discussion of the risks versus the benefits of the procedure the Patient elected to defer the thoracentesis at this time.  Procedure not performed.

## 2023-05-22 NOTE — Plan of Care (Signed)

## 2023-05-22 NOTE — Care Management Important Message (Signed)
 Important Message  Patient Details  Name: Aaron Sexton MRN: [id number] Date of Birth: [birth year]   Important Message Given:  Yes - Medicare IM     Cristela Blue, CMA 05/22/2023, 9:10 AM

## 2023-05-22 NOTE — Progress Notes (Signed)
 PROGRESS NOTE    Aaron Sexton  FMW:[id number] DOB: [birth year] DOA: 05/18/2023 PCP: Jimmy Charlie FERNS, MD  Chief Complaint  Patient presents with   Groin Pain    Hospital Course:  Aaron Sexton is 78 y.o. 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  On arrival to the ED CT abdomen pelvis revealed right lower lobe pneumonia suspicious for necrotizing pneumonia with questionable underlying mass.  Right hip CT showed mild OA, severe right-sided sacroiliac OA.  Blood work revealed pancytopenia with a WBC of 3.3, platelets of 58, hemoglobin 7.7, and sodium of 124.  He was started on ceftriaxone  and sisomicin  Subjective: Tachypnea improving some this morning.  Still requiring oxygen.  Patient was meant to go for thoracentesis with IR.  When he got to IR suite he decided he did not want to proceed due to the risk of pneumothorax. I have discussed this at bedside with he and his wife.  They will proceed with outpatient follow-up and repeat CT scan to ensure resolution.   Objective: Vitals:   05/21/23 1854 05/21/23 2157 05/22/23 0949 05/22/23 1044  BP: (!) 142/55 (!) 143/73 (!) 139/92 118/77  Pulse: 90 96 72 68  Resp: (!) 22 (!) 21 20   Temp: 97.9 F (36.6 C) 98.1 F (36.7 C) (!) 97.5 F (36.4 C)   TempSrc: Oral Oral Oral   SpO2: 96% 93% 94% 97%  Weight:      Height:        Intake/Output Summary (Last 24 hours) at 05/22/2023 1621 Last data filed at 05/21/2023 1815 Gross per 24 hour  Intake --  Output 200 ml  Net -200 ml   Filed Weights   05/18/23 2251  Weight: 88.5 kg    Examination: General exam: Appears calm and comfortable, NAD Respiratory system: Tachypnea, chest wall expansion, shallow breaths. Cardiovascular system: S1 & S2 heard,  RRR.  Gastrointestinal system: Abdomen is nondistended, soft and nontender.  Neuro: Alert and oriented. No focal neurological deficits. Extremities: Symmetric, expected ROM Skin: No rashes, lesions Psychiatry: Demonstrates appropriate judgement and insight. Mood & affect appropriate for situation.   Assessment & Plan:  Principal Problem:   Community acquired pneumonia Active Problems:   Right hip pain   PNA (pneumonia)    Right lower lobe necrotizing pneumonia - Worsening tachypnea and hypoxia 1/13.  CTA revealed pulmonary nodules, lymphadenopathy, and growing right-sided pleural effusion.  May all be secondary to pneumonia but cannot rule out malignancy - IR was consulted to perform thoracentesis on 1/14 but patient ended up declining procedure. - For now we will proceed with antibiotics and continue to wean oxygen as tolerated - Patient will need to follow-up with his oncologist, whom I have communicated with directly, to repeat CT scan in the future and ensure resolution of effusion.  He may require outpatient biopsy if nodules and lymphadenopathy persist.  I discussed all this at bedside with the patient and his wife.  They endorsed understanding. - silent Aspiration pneumonia is on the differential given patient's deconditioned status. - Continue with Unasyn  and azithromycin  - Sputum culture sent with atypical antigens, negative so far - Continue incentive spirometry and flutter valve - Hold off on chemotherapy Revlimid  for now given current infection  Right groin pain Acute ambulation impairment - osteoarthritis of the right hip -  Proceed with symptomatic management - PT/OT.  PT score: 22, would benefit from outpatient physical therapy - Does have recent TBI/ABI study 4 months ago with minimal PAD.  Hyponatremia - Likely secondary to chlorthalidone  which he is taking outpatient - Sodium improving now - Avoid diuretics at discharge  Hypokalemia - Replace as  needed  Hypertension - Will restart gradually given low Bps on arrival  CAD Peripheral vascular disease - Continue statin and DAPT  Pancytopenia Chronic anemia Myelodysplastic syndrome - Patient is actively on chemotherapy at home, will hold Revlimid  while he is admitted - Needs follow-up closely with outpatient oncology - Hold chemical DVT prophylaxis due to severe thrombocytopenia - Continue to trend hemoglobin, currently stable at 7.7.  Transfuse if he falls below 7  CKD stage IIIa - Appears to be at baseline creatinine 1.4 - Avoid nephrotoxic meds - Renally dose when needed with creatinine clearance 43     DVT prophylaxis: SCDs   Code Status: Full Code Family Communication: Have discussed with wife at bedside.  Disposition:  Status is: Inpatient     Consultants:      Procedures:    Antimicrobials:  Anti-infectives (From admission, onward)    Start     Dose/Rate Route Frequency Ordered Stop   05/21/23 1000  azithromycin  (ZITHROMAX ) tablet 500 mg        500 mg Oral Daily 05/20/23 1121     05/20/23 1000  cefTRIAXone  (ROCEPHIN ) 2 g in sodium chloride  0.9 % 100 mL IVPB  Status:  Discontinued        2 g 200 mL/hr over 30 Minutes Intravenous Every 24 hours 05/19/23 0858 05/19/23 0901   05/20/23 1000  azithromycin  (ZITHROMAX ) 500 mg in sodium chloride  0.9 % 250 mL IVPB        500 mg 250 mL/hr over 60 Minutes Intravenous Every 24 hours 05/19/23 0859 05/20/23 1758   05/19/23 1600  Ampicillin -Sulbactam (UNASYN ) 3 g in sodium chloride  0.9 % 100 mL IVPB        3 g 200 mL/hr over 30 Minutes Intravenous Every 6 hours 05/19/23 0907     05/19/23 0330  cefTRIAXone  (ROCEPHIN ) 2 g in sodium chloride  0.9 % 100 mL IVPB        2 g 200 mL/hr over 30 Minutes Intravenous  Once 05/19/23 0329 05/19/23 0546   05/19/23 0330  azithromycin  (ZITHROMAX ) 500 mg in sodium chloride  0.9 % 250 mL IVPB        500 mg 250 mL/hr over 60 Minutes Intravenous  Once 05/19/23 0329 05/19/23 9385        Data Reviewed: I have personally reviewed following labs and imaging studies CBC: Recent Labs  Lab 05/18/23 2309 05/20/23 0611 05/21/23 0653 05/22/23 0547  WBC 2.3* 2.0* 2.3* 3.1*  NEUTROABS  --   --  1.2* 1.6*  HGB 7.7* 7.6* 7.7* 7.2*  HCT 22.8* 22.6* 22.5* 21.2*  MCV 107.5* 108.7* 105.6* 106.0*  PLT 58* 55* 54* 59*   Basic Metabolic Panel: Recent Labs  Lab 05/18/23 2309 05/19/23 1611 05/20/23 0611 05/21/23 0653 05/22/23 0547  NA 124* 127* 128* 132* 133*  K 3.2*  --  3.4* 3.2* 3.4*  CL 90*  --  95* 97* 100  CO2 22  --  24 24 25   GLUCOSE 114*  --  93 94 95  BUN 34*  --  25* 21 22  CREATININE 1.54*  --  1.47* 1.26* 1.28*  CALCIUM 8.4*  --  8.3* 8.1* 8.2*  MG  --   --   --  2.1 2.1  PHOS  --   --   --  3.1 3.4   GFR: Estimated Creatinine Clearance: 49.7 mL/min (A) (by C-G formula based on SCr of 1.28 mg/dL (H)). Liver Function Tests: Recent Labs  Lab 05/18/23 2309 05/21/23 0653 05/22/23 0547  AST 107* 94* 118*  ALT 69* 91* 110*  ALKPHOS 86 129* 138*  BILITOT 0.6 0.9 0.5  PROT 7.5 6.9 6.8  ALBUMIN  3.1* 2.6* 2.4*   CBG: No results for input(s): GLUCAP in the last 168 hours.  Recent Results (from the past 240 hours)  Resp panel by RT-PCR (RSV, Flu A&B, Covid) Anterior Nasal Swab     Status: None   Collection Time: 05/19/23  1:23 AM   Specimen: Anterior Nasal Swab  Result Value Ref Range Status   SARS Coronavirus 2 by RT PCR NEGATIVE NEGATIVE Final    Comment: (NOTE) SARS-CoV-2 target nucleic acids are NOT DETECTED.  The SARS-CoV-2 RNA is generally detectable in upper respiratory specimens during the acute phase of infection. The lowest concentration of SARS-CoV-2 viral copies this assay can detect is 138 copies/mL. A negative result does not preclude SARS-Cov-2 infection and should not be used as the sole basis for treatment or other patient management decisions. A negative result may occur with  improper specimen collection/handling, submission  of specimen other than nasopharyngeal swab, presence of viral mutation(s) within the areas targeted by this assay, and inadequate number of viral copies(<138 copies/mL). A negative result must be combined with clinical observations, patient history, and epidemiological information. The expected result is Negative.  Fact Sheet for Patients:  bloggercourse.com  Fact Sheet for Healthcare Providers:  seriousbroker.it  This test is no t yet approved or cleared by the [place]  FDA and  has been authorized for detection and/or diagnosis of SARS-CoV-2 by FDA under an Emergency Use Authorization (EUA). This EUA will remain  in effect (meaning this test can be used) for the duration of the COVID-19 declaration under Section 564(b)(1) of the Act, 21 U.S.C.section 360bbb-3(b)(1), unless the authorization is terminated  or revoked sooner.       Influenza A by PCR NEGATIVE NEGATIVE Final   Influenza B by PCR NEGATIVE NEGATIVE Final    Comment: (NOTE) The Xpert Xpress SARS-CoV-2/FLU/RSV plus assay is intended as an aid in the diagnosis of influenza from Nasopharyngeal swab specimens and should not be used as a sole basis for treatment. Nasal washings and aspirates are unacceptable for Xpert Xpress SARS-CoV-2/FLU/RSV testing.  Fact Sheet for Patients: bloggercourse.com  Fact Sheet for Healthcare Providers: seriousbroker.it  This test is not yet approved or cleared by the [place]  FDA and has been authorized for detection and/or diagnosis of SARS-CoV-2 by FDA under an Emergency Use Authorization (EUA). This EUA will remain in effect (meaning this test can be used) for the duration of the COVID-19 declaration under Section 564(b)(1) of the Act, 21 U.S.C. section 360bbb-3(b)(1), unless the authorization is terminated or revoked.     Resp Syncytial Virus by PCR NEGATIVE NEGATIVE  Final    Comment: (NOTE) Fact Sheet for Patients: bloggercourse.com  Fact Sheet for Healthcare Providers: seriousbroker.it  This test is not yet approved or cleared by the [place]  FDA and has been authorized for detection and/or diagnosis of SARS-CoV-2 by FDA under an Emergency Use Authorization (EUA). This EUA will remain in effect (meaning this test can be used) for the duration of the COVID-19 declaration under Section 564(b)(1) of the Act, 21 U.S.C. section 360bbb-3(b)(1), unless the authorization  is terminated or revoked.  Performed at [facility], [address] [place], [place] [address]   Blood culture (routine x 2)     Status: None (Preliminary result)   Collection Time: 05/19/23  4:35 AM   Specimen: BLOOD RIGHT HAND  Result Value Ref Range Status   Specimen Description BLOOD RIGHT HAND  Final   Special Requests   Final    BOTTLES DRAWN AEROBIC AND ANAEROBIC Blood Culture results may not be optimal due to an inadequate volume of blood received in culture bottles   Culture   Final    NO GROWTH 3 DAYS Performed at [facility], [address] [place], [place] [address]    Report Status PENDING  Incomplete  Blood culture (routine x 2)     Status: None (Preliminary result)   Collection Time: 05/19/23  4:36 AM   Specimen: BLOOD RIGHT ARM  Result Value Ref Range Status   Specimen Description BLOOD RIGHT ARM  Final   Special Requests   Final    BOTTLES DRAWN AEROBIC AND ANAEROBIC Blood Culture results may not be optimal due to an inadequate volume of blood received in culture bottles   Culture   Final    NO GROWTH 3 DAYS Performed at [facility], [address] [place], [place] [address]    Report Status PENDING  Incomplete     Radiology Studies: US  CHEST (PLEURAL EFFUSION) Result Date: 05/22/2023 INDICATION: History of worsening shortness of breath. Found to have a small  right-sided pleural effusion. Request is for therapeutic and diagnostic right-sided thoracentesis EXAM: CHEST ULTRASOUND COMPARISON:  CT angio chest dated May 21, 2023 FINDINGS: Small right-sided pleural effusion. IMPRESSION: Small left-sided pleural effusion. After discussion of the risks versus benefits of the procedure the patient elected to defer the thoracentesis at this time. Read by: Delon Beagle, NP Electronically Signed   By: CHRISTELLA.  Shick M.D.   On: 05/22/2023 11:10   CT Angio Chest Pulmonary Embolism (PE) W or WO Contrast Result Date: 05/21/2023 CLINICAL DATA:  Worsening shortness of breath and tachypnea this morning. Pulmonary embolism suspected. Possible necrotizing pneumonia. EXAM: CT ANGIOGRAPHY CHEST WITH CONTRAST TECHNIQUE: Multidetector CT imaging of the chest was performed using the standard protocol during bolus administration of intravenous contrast. Multiplanar CT image reconstructions and MIPs were obtained to evaluate the vascular anatomy. RADIATION DOSE REDUCTION: This exam was performed according to the departmental dose-optimization program which includes automated exposure control, adjustment of the mA and/or kV according to patient size and/or use of iterative reconstruction technique. CONTRAST:  75mL OMNIPAQUE  IOHEXOL  350 MG/ML SOLN COMPARISON:  Chest radiographs and abdominal CT 05/19/2023. No prior chest CT. FINDINGS: Cardiovascular: The pulmonary arteries are well opacified with contrast to the level of the segmental branches. There is no evidence of acute pulmonary embolism. No acute systemic arterial abnormalities are identified. There is atherosclerosis of the aorta, great vessels and coronary arteries. The heart size is normal. There is no pericardial effusion. Mediastinum/Nodes: Mildly enlarged right paratracheal node measures 1.5 cm short axis on image 114/5. There are prominent right hilar lymph nodes measuring up to 1 cm in diameter. There is right pericardiac  nodularity which may reflect small lymph nodes or pleural base nodules. No other enlarged mediastinal or axillary lymph nodes are seen. The thyroid gland, trachea and esophagus demonstrate no significant findings. Lungs/Pleura: Small dependent right pleural effusion has increased in volume compared with the recent abdominal CT. Interval increased volume loss in the right lower lobe  with associated consolidation or ill-defined mass, slightly increased from the recent prior study. There are scattered small pulmonary nodules bilaterally which are primarily subpleural in location, including a 6 mm right middle lobe nodule on image 81/6, a 6 mm lingular nodule on image 82/6 and a 7 mm left lower lobe nodule on image 100/6, unchanged from recent abdominal CT. Additional scattered nodules are present measuring up to 6 mm anteriorly in the left upper lobe on image 37/6. Upper abdomen: No acute findings are seen in the visualized upper abdomen. Musculoskeletal/Chest wall: There is no chest wall mass or suspicious osseous finding. Mild multilevel spondylosis. Review of the MIP images confirms the above findings. IMPRESSION: 1. No evidence of acute pulmonary embolism or other acute vascular findings in the chest. 2. Interval increased volume loss in the right lower lobe with associated consolidation and enlarging adjacent pleural effusion, suspicious for pneumonia. Radiographic follow-up recommended to ensure resolution. 3. Scattered small pulmonary nodules bilaterally, primarily subpleural in location, nonspecific. Metastatic disease not excluded. Non-contrast chest CT at 3-6 months is recommended. If the nodules are stable at time of repeat CT, then future CT at 18-24 months (from today's scan) is considered optional for low-risk patients, but is recommended for high-risk patients. This recommendation follows the consensus statement: Guidelines for Management of Incidental Pulmonary Nodules Detected on CT Images: From the  Fleischner Society 2017; Radiology 2017; 284:228-243. 4. Mildly enlarged right paratracheal and right hilar lymph nodes, also nonspecific and potentially reactive. Recommend attention on follow-up CT. 5.  Aortic Atherosclerosis (ICD10-I70.0). Electronically Signed   By: Elsie Perone M.D.   On: 05/21/2023 11:39    Scheduled Meds:  aspirin  EC  81 mg Oral QPM   azithromycin   500 mg Oral Daily   guaiFENesin   1,200 mg Oral BID   pantoprazole   40 mg Oral Daily   pravastatin   40 mg Oral q1800   sodium chloride   1 g Oral BID WC   Continuous Infusions:  ampicillin -sulbactam (UNASYN ) IV 3 g (05/22/23 0917)     LOS: 3 days    Time spent:  55min  Jeniece Hannis, DO [facility]  To contact the attending physician between 7A-7P please use Epic Chat. To contact the covering physician during after hours 7P-7A, please review Amion.   05/22/2023, 4:21 PM   *This document has been created with the assistance of dictation software. Please excuse typographical errors. *

## 2023-05-22 NOTE — TOC Initial Note (Signed)
 Transition of Care [facility]) - Initial/Assessment Note    Patient Details  Name: Aaron Sexton MRN: [id number] Date of Birth: 1945/10/18  Transition of Care [facility]) CM/SW Contact:    Royanne JINNY Bernheim, RN Phone Number: 05/22/2023, 12:15 PM  Clinical Narrative:                  Transition of Care (TOC) Screening Note  The patient lives at home with his spouse, he is physically at his baseline PCP  Jimmy Charlie FERNS, MD     General - Internal Medicine    [phone]   Patient Details  Name: Aaron Sexton Date of Birth: Jun 28, 1945   Transition of Care [facility]) CM/SW Contact:    Royanne JINNY Bernheim, RN Phone Number: 05/22/2023, 12:15 PM    Transition of Care Department [facility]) has reviewed patient and no TOC needs have been identified at this time. We will continue to monitor patient advancement through interdisciplinary progression rounds. If new patient transition needs arise, please place a TOC consult.          Patient Goals and CMS Choice            Expected Discharge Plan and Services                                              Prior Living Arrangements/Services                       Activities of Daily Living   ADL Screening (condition at time of admission) Independently performs ADLs?: Yes (appropriate for developmental age) Is the patient deaf or have difficulty hearing?: No Does the patient have difficulty seeing, even when wearing glasses/contacts?: No Does the patient have difficulty concentrating, remembering, or making decisions?: No  Permission Sought/Granted                  Emotional Assessment              Admission diagnosis:  Hyponatremia [E87.1] Community acquired pneumonia [J18.9] Right hip pain [M25.551] Pleural effusion on right [J90] Right lower quadrant abdominal pain [R10.31] PNA (pneumonia) [J18.9] Pneumonia of right lower lobe due to infectious organism [J18.9] Patient Active Problem List    Diagnosis Date Noted   Community acquired pneumonia 05/19/2023   Right hip pain 05/19/2023   PNA (pneumonia) 05/19/2023   Preventative health care 02/12/2023   MDS (myelodysplastic syndrome) (HCC) 01/01/2023   History of colonic polyps 10/27/2022   Essential hypertension, benign 02/09/2022   PAD (peripheral artery disease) (HCC) 12/09/2021   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) 11/29/2021   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   Carotid arterial disease (HCC) 11/03/2015   Claudication (HCC) 08/09/2015   Advance directive discussed with patient 08/25/2014   Anemia 08/19/2013   Antiplatelet or antithrombotic long-term use 08/16/2012   Adenomatous colon polyp    Vitamin B12 deficiency    Arteriosclerotic cardiovascular disease (ASCVD)    Cerebrovascular disease    Peripheral vascular disease (HCC) 03/11/2010   Obesity, unspecified 03/08/2009   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   HYPERLIPIDEMIA 12/07/2006   Gastroesophageal reflux disease 11/30/2006   PCP:  Jimmy Charlie FERNS, MD Pharmacy:   [name] [place], [place] - [address] AT [facility] [address] [place] [address] Phone:  [phone] Fax: [phone]  [facility] [address] [place] [address] Phone: [phone] Fax: [phone]  [facility] - [place], [place] - [address] [place] [address] Phone: [phone] Fax: [phone]     Social Drivers of Health (SDOH) Social History: SDOH Screenings   Food Insecurity: No Food Insecurity (05/21/2023)  Housing: Low Risk  (05/21/2023)  Transportation Needs: No Transportation Needs (05/21/2023)  Utilities: At Risk (05/21/2023)  Alcohol Screen: Low Risk  (03/10/2020)  Depression (PHQ2-9): Low Risk  (02/12/2023)  Financial Resource Strain: Low Risk  (03/10/2020)  Physical Activity: Inactive (03/10/2020)  Social  Connections: Moderately Integrated (05/21/2023)  Stress: No Stress Concern Present (03/10/2020)  Tobacco Use: Medium Risk (05/18/2023)   SDOH Interventions:     Readmission Risk Interventions    01/12/2022   10:42 AM 12/19/2021   10:04 AM 12/13/2021   10:33 AM  Readmission Risk Prevention Plan  Transportation Screening Complete Complete Complete  PCP or Specialist Appt within 5-7 Days Complete Complete Complete  Home Care Screening Complete Complete Complete  Medication Review (RN CM) Complete Complete Complete

## 2023-05-23 ENCOUNTER — Inpatient Hospital Stay: Payer: Medicare PPO

## 2023-05-23 ENCOUNTER — Inpatient Hospital Stay: Payer: Medicare PPO | Admitting: Hematology

## 2023-05-23 DIAGNOSIS — N1831 Chronic kidney disease, stage 3a: Secondary | ICD-10-CM

## 2023-05-23 DIAGNOSIS — E871 Hypo-osmolality and hyponatremia: Principal | ICD-10-CM

## 2023-05-23 DIAGNOSIS — J189 Pneumonia, unspecified organism: Secondary | ICD-10-CM | POA: Diagnosis not present

## 2023-05-23 DIAGNOSIS — D469 Myelodysplastic syndrome, unspecified: Secondary | ICD-10-CM | POA: Diagnosis not present

## 2023-05-23 DIAGNOSIS — I10 Essential (primary) hypertension: Secondary | ICD-10-CM

## 2023-05-23 DIAGNOSIS — K219 Gastro-esophageal reflux disease without esophagitis: Secondary | ICD-10-CM

## 2023-05-23 DIAGNOSIS — E876 Hypokalemia: Secondary | ICD-10-CM | POA: Insufficient documentation

## 2023-05-23 LAB — CBC WITH DIFFERENTIAL/PLATELET
Abs Immature Granulocytes: 0.04 10*3/uL (ref 0.00–0.07)
Basophils Absolute: 0 10*3/uL (ref 0.0–0.1)
Basophils Relative: 1 %
Eosinophils Absolute: 0.1 10*3/uL (ref 0.0–0.5)
Eosinophils Relative: 3 %
HCT: 21 % — ABNORMAL LOW (ref 39.0–52.0)
Hemoglobin: 7.3 g/dL — ABNORMAL LOW (ref 13.0–17.0)
Immature Granulocytes: 1 %
Lymphocytes Relative: 35 %
Lymphs Abs: 1.2 10*3/uL (ref 0.7–4.0)
MCH: 35.8 pg — ABNORMAL HIGH (ref 26.0–34.0)
MCHC: 34.8 g/dL (ref 30.0–36.0)
MCV: 102.9 fL — ABNORMAL HIGH (ref 80.0–100.0)
Monocytes Absolute: 0.2 10*3/uL (ref 0.1–1.0)
Monocytes Relative: 7 %
Neutro Abs: 1.8 10*3/uL (ref 1.7–7.7)
Neutrophils Relative %: 53 %
Platelets: 53 10*3/uL — ABNORMAL LOW (ref 150–400)
RBC: 2.04 MIL/uL — ABNORMAL LOW (ref 4.22–5.81)
RDW: 18.5 % — ABNORMAL HIGH (ref 11.5–15.5)
Smear Review: DECREASED
WBC Morphology: INCREASED
WBC: 3.4 10*3/uL — ABNORMAL LOW (ref 4.0–10.5)
nRBC: 0 % (ref 0.0–0.2)

## 2023-05-23 LAB — PROCALCITONIN: Procalcitonin: 0.19 ng/mL

## 2023-05-23 LAB — COMPREHENSIVE METABOLIC PANEL
ALT: 160 U/L — ABNORMAL HIGH (ref 0–44)
AST: 206 U/L — ABNORMAL HIGH (ref 15–41)
Albumin: 2.4 g/dL — ABNORMAL LOW (ref 3.5–5.0)
Alkaline Phosphatase: 156 U/L — ABNORMAL HIGH (ref 38–126)
Anion gap: 13 (ref 5–15)
BUN: 28 mg/dL — ABNORMAL HIGH (ref 8–23)
CO2: 22 mmol/L (ref 22–32)
Calcium: 8.1 mg/dL — ABNORMAL LOW (ref 8.9–10.3)
Chloride: 100 mmol/L (ref 98–111)
Creatinine, Ser: 1.33 mg/dL — ABNORMAL HIGH (ref 0.61–1.24)
GFR, Estimated: 54 mL/min — ABNORMAL LOW (ref >60)
Glucose, Bld: 101 mg/dL — ABNORMAL HIGH (ref 70–99)
Potassium: 3.3 mmol/L — ABNORMAL LOW (ref 3.5–5.1)
Sodium: 135 mmol/L (ref 135–145)
Total Bilirubin: 0.6 mg/dL (ref 0.0–1.2)
Total Protein: 6.7 g/dL (ref 6.5–8.1)

## 2023-05-23 LAB — MAGNESIUM: Magnesium: 2.1 mg/dL (ref 1.7–2.4)

## 2023-05-23 LAB — PHOSPHORUS: Phosphorus: 3.8 mg/dL (ref 2.5–4.6)

## 2023-05-23 MED ORDER — AMOXICILLIN-POT CLAVULANATE 875-125 MG PO TABS
1.0000 | ORAL_TABLET | Freq: Two times a day (BID) | ORAL | Status: DC
  Administered 2023-05-23: 1 via ORAL
  Filled 2023-05-23: qty 1

## 2023-05-23 MED ORDER — AMOXICILLIN-POT CLAVULANATE 875-125 MG PO TABS: 1.0000 | ORAL_TABLET | Freq: Two times a day (BID) | ORAL | 0 refills | Status: DC

## 2023-05-23 MED ORDER — POTASSIUM CHLORIDE CRYS ER 20 MEQ PO TBCR
40.0000 meq | EXTENDED_RELEASE_TABLET | Freq: Once | ORAL | Status: AC
  Administered 2023-05-23: 40 meq via ORAL
  Filled 2023-05-23: qty 2

## 2023-05-23 MED ORDER — IPRATROPIUM-ALBUTEROL 0.5-2.5 (3) MG/3ML IN SOLN: 3.0000 mL | Freq: Four times a day (QID) | RESPIRATORY_TRACT | 0 refills | Status: DC | PRN

## 2023-05-23 MED ORDER — GUAIFENESIN ER 600 MG PO TB12: 1200.0000 mg | ORAL_TABLET | Freq: Two times a day (BID) | ORAL | Status: AC

## 2023-05-23 MED ORDER — ONDANSETRON HCL 4 MG PO TABS: 4.0000 mg | ORAL_TABLET | Freq: Four times a day (QID) | ORAL | 0 refills | Status: DC | PRN

## 2023-05-23 NOTE — Assessment & Plan Note (Signed)
 05-23-2023 due to chlorthalidone . This has been stopped.

## 2023-05-23 NOTE — Assessment & Plan Note (Signed)
 05-23-2023 stable. On protonix  40 mg qday.

## 2023-05-23 NOTE — Progress Notes (Signed)
 Physical Therapy Treatment Patient Details Name: Aaron Sexton MRN: [id number] DOB: 11-16-1945 Today's Date: 05/23/2023   History of Present Illness Aaron Sexton is a 77yoM who comes to [facility] 1/10 worsening productive cough and acute Rt hip pain. Workup revealing of right lower lobe pneumonia. 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    PT Comments  Pt is making good progress towards goals. Pt uses RW with flexed posture. Reports he is using a upright RW at home, however not available in hospital setting. All mobility performed on RA. Encouraged to continue to perform breathing exercises. WIll continue to progress as able.  SaO2 on room air at rest = 95% SaO2 on room air while ambulating = 94% SaO2 on n/a liters of O2 while ambulating = n/a%     If plan is discharge home, recommend the following: A little help with walking and/or transfers;Help with stairs or ramp for entrance   Can travel by private vehicle        Equipment Recommendations  None recommended by PT    Recommendations for Other Services       Precautions / Restrictions Precautions Precautions: Fall Restrictions Weight Bearing Restrictions Per Provider Order: No     Mobility  Bed Mobility Overal bed mobility: Modified Independent             General bed mobility comments: Safe technique    Transfers Overall transfer level: Modified independent Equipment used: Rolling walker (2 wheels)               General transfer comment: safe technique    Ambulation/Gait Ambulation/Gait assistance: Contact guard assist Gait Distance (Feet): 100 Feet Assistive device: Rolling walker (2 wheels) Gait Pattern/deviations: Step-through pattern       General Gait Details: flexed posture. Needs cues for sequencing and slow speed.   Stairs              Wheelchair Mobility     Tilt Bed    Modified Rankin (Stroke Patients Only)       Balance Overall balance assessment: Modified Independent                                          Cognition Arousal: Alert Behavior During Therapy: WFL for tasks assessed/performed Overall Cognitive Status: Within Functional Limits for tasks assessed                                          Exercises      General Comments        Pertinent Vitals/Pain Pain Assessment Pain Assessment: No/denies pain    Home Living                          Prior Function            PT Goals (current goals can now be found in the care plan section) Acute Rehab PT Goals Patient Stated Goal: get strnegth back PT Goal Formulation: With patient Time For Goal Achievement: 06/03/23 Potential to Achieve Goals: Good Progress towards PT goals: Progressing toward goals    Frequency  Min 1X/week      PT Plan      Co-evaluation              AM-PAC PT "6 Clicks" Mobility   Outcome Measure  Help needed turning from your back to your side while in a flat bed without using bedrails?: None Help needed moving from lying on your back to sitting on the side of a flat bed without using bedrails?: None Help needed moving to and from a bed to a chair (including a wheelchair)?: None Help needed standing up from a chair using your arms (e.g., wheelchair or bedside chair)?: None Help needed to walk in hospital room?: A Little Help needed climbing 3-5 steps with a railing? : A Little 6 Click Score: 22    End of Session Equipment Utilized During Treatment: Gait belt Activity Tolerance: Patient tolerated treatment well;No increased pain Patient left: in bed;with call bell/phone within reach;with family/visitor present Nurse Communication: Mobility status PT Visit Diagnosis: Other abnormalities of gait and mobility (R26.89);Muscle weakness  (generalized) (M62.81)     Time: 2956-2130 PT Time Calculation (min) (ACUTE ONLY): 15 min  Charges:    $Gait Training: 8-22 mins PT General Charges $$ ACUTE PT VISIT: 1 Visit                     Amparo Balk, PT, DPT, GCS [phone]    Clotiel Troop 05/23/2023, 1:47 PM

## 2023-05-23 NOTE — Assessment & Plan Note (Addendum)
 05-23-2023 follows with heme/onc at [facility]. HgB 7.3 g/dl. I offered to give him a PRBC transfusion before he left the hospital. Pt refuses and want to f/u with his heme/onc provider to decide if he needs PRBC transfusion.  *update. Pt's revlimid  is on hold until he has completed po abx therapy and has been seen by his oncologist after discharge.

## 2023-05-23 NOTE — Discharge Summary (Signed)
 [facility] Physician Discharge Summary   Patient name: Aaron Sexton  Admit date:     05/18/2023  Discharge date: 05/23/2023  Attending Physician: Aaron Sexton [[id number]]  Discharge Physician: Aaron Sexton   PCP: Aaron Llanos, MD  Admitted From: Home  Disposition:  Home  Recommendations for Outpatient Follow-up:  Follow up with PCP in 1-2 weeks to determine if HTN meds needs to be restarted. Follow up with heme/onc in 1-2 weeks  Please follow up on the following pending results: pt will need repeat CT chest in 3-6 months to follow his pulmonary nodules. He can also be referred to pulmonology as outpatient.  Home Health:Yes: PT, OT Equipment/Devices: None  Discharge Condition:Stable CODE STATUS:FULL Diet recommendation: Heart Healthy Fluid Restriction: None  Hospital Summary: HPI: Aaron Sexton is a 78 y.o. 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   Patient started to have a productive cough about 1 week ago, with occasional whitish phlegm production, denied any chest pain no fever or chills.  Denied any urinary symptoms such as runny nose sore throat, denied any sick contact.  Last few days patient has developed generalized weakness and 2 days ago he started to have severe right hip pain, located on the right groin area nonradiating, constant worsening with movement and cough, denied any radiation pain, denied any claudications.  Patient denied any trouble swallowing and no choke coughing after eating or drinking.  Significant Events: Admitted 05/18/2023 for RLL pneumoniia   Significant Labs: WBC 3.3, hemoglobin 7.7, platelet 58, sodium 124, K3.2, creatinine 1.5  bicarb 22.   Significant Imaging Studies: 05-19-2023 CT abdomen pelvis incidental finding of right lower lobe pneumonia, suspicious for necrotizing pneumonia with question of underlying mass 05-19-2023 Right hip CT showed mild OA, severe right-sided sacroiliac OA  05-19-2023 Right LE U/S negative for DVT 05-21-2023 CTPA No evidence of acute pulmonary embolism or other acute vascular findings in the chest. 2. Interval increased volume loss in the right lower lobe with associated consolidation and enlarging adjacent pleural effusion, suspicious for pneumonia. Radiographic follow-up recommended to ensure resolution. 3. Scattered small pulmonary nodules bilaterally, primarily subpleural in location, nonspecific. Metastatic disease not excluded. Non-contrast chest CT at 3-6 months is recommended. 4. Mildly enlarged right paratracheal and right hilar lymph nodes, also nonspecific and potentially reactive. Recommend attention on follow-up CT. 5.  Aortic Atherosclerosis  Antibiotic Therapy: Anti-infectives (From admission, onward)    Start     Dose/Rate Route Frequency Ordered Stop   05/21/23 1000  azithromycin  (ZITHROMAX ) tablet 500 mg        500 mg Oral Daily 05/20/23 1121     05/20/23 1000  cefTRIAXone  (ROCEPHIN ) 2 g in sodium chloride  0.9 % 100 mL IVPB  Status:  Discontinued        2 g 200 mL/hr over 30 Minutes Intravenous Every 24 hours 05/19/23 0858 05/19/23 0901   05/20/23 1000  azithromycin  (ZITHROMAX ) 500 mg in sodium chloride  0.9 % 250 mL IVPB        500 mg 250 mL/hr over 60 Minutes Intravenous Every 24 hours 05/19/23 0859 05/20/23 1758   05/19/23 1600  Ampicillin -Sulbactam (UNASYN ) 3 g in sodium chloride  0.9 % 100 mL IVPB  3 g 200 mL/hr over 30 Minutes Intravenous Every 6 hours 05/19/23 0907     05/19/23 0330  cefTRIAXone  (ROCEPHIN ) 2 g in sodium chloride  0.9 % 100 mL IVPB        2 g 200 mL/hr over 30 Minutes Intravenous  Once 05/19/23 0329 05/19/23 0546   05/19/23 0330   azithromycin  (ZITHROMAX ) 500 mg in sodium chloride  0.9 % 250 mL IVPB        500 mg 250 mL/hr over 60 Minutes Intravenous  Once 05/19/23 0329 05/19/23 1610       Procedures:   Consultants:    Hospital Course by Problem: * Community acquired pneumonia 05-23-2023 procal 0.19. WBC is 3.4. blood cx still no growth after 4 days. Change to po augmentin  875 mg bid. Has completed 5 days of IV/po zithromax  500 mg daily.  *update. Pt will go home with 5 days of po augmentin  875 mg bid x 5 days. This will complete 10 days of po abx. He may need repeat CXR VS CT chest in 4-6 weeks to document resolution of his pneumonia.  MDS (myelodysplastic syndrome) (HCC) 05-23-2023 follows with heme/onc at [facility]. HgB 7.3 g/dl. I offered to give him a PRBC transfusion before he left the hospital. Pt refuses and want to f/u with his heme/onc provider to decide if he needs PRBC transfusion.  *update. Pt's revlimid  is on hold until he has completed po abx therapy and has been seen by his oncologist after discharge.  Hyponatremia 05-23-2023 due to chlorthalidone . This has been stopped.  CKD stage 3a, GFR 45-59 ml/min (HCC) - baseline Scr 1.2-1.6 05-23-2023 Scr 1.33 today.   Right hip pain 05-23-2023 due to OA on xray.  Essential hypertension, benign 05-23-2023 off chlorthalidone  due to hyponatremia. BP is stable off HTN meds for now. F/u with PCP after discharge.  *update. All of pt's HTN meds were held during his hospitalization due to poor po intake and borderline BP. Pt's lopressor , losartan  and chlorthalidone  will all be held at discharge. Pt to f/u with PCP to restart HTN meds if needed. At this point, I would highly recommended only restarting 1 medication at a time. If it impairs his renal function such as chlorthalidone  or losartan , he will need BMET/BMP 1 week after initiation of therapy.  Gastroesophageal reflux disease 05-23-2023 stable. On protonix  40 mg qday.  Hypokalemia 05-23-2023  replete with po kcl.    Discharge Diagnoses:  Principal Problem:   Community acquired pneumonia Active Problems:   MDS (myelodysplastic syndrome) (HCC)   Gastroesophageal reflux disease   Essential hypertension, benign   Right hip pain   CKD stage 3a, GFR 45-59 ml/min (HCC) - baseline Scr 1.2-1.6   Hyponatremia   Hypokalemia   Discharge Instructions  Discharge Instructions     Call MD for:  difficulty breathing, headache or visual disturbances   Complete by: As directed    Call MD for:  extreme fatigue   Complete by: As directed    Call MD for:  hives   Complete by: As directed    Call MD for:  persistant dizziness or light-headedness   Complete by: As directed    Call MD for:  persistant nausea and vomiting   Complete by: As directed    Call MD for:  redness, tenderness, or signs of infection (pain, swelling, redness, odor or green/yellow discharge around incision site)   Complete by: As directed    Call MD for:  severe uncontrolled pain   Complete by: As  directed    Call MD for:  temperature >100.4   Complete by: As directed    Diet - low sodium heart healthy   Complete by: As directed    Discharge instructions   Complete by: As directed    1. Follow up with your primary care provider in 1 week after discharge from hospital to decide on how to restart your blood pressure medications 2. Follow up with your oncologist regarding your blood count and need for blood transfusion.   Increase activity slowly   Complete by: As directed       Allergies as of 05/23/2023       Reactions   Cardizem [diltiazem Hcl] Hives, Rash        Medication List     PAUSE taking these medications    chlorthalidone  25 MG tablet Wait to take this until your doctor or other care provider tells you to start again. Commonly known as: HYGROTON  TAKE 1/2 TABLET(12.5 MG) BY MOUTH DAILY   lenalidomide  5 MG capsule Wait to take this until your doctor or other care provider tells you to  start again. Commonly known as: REVLIMID  Take 1 capsule (5 mg total) by mouth daily. TAKE 1 CAPSULE (5MG ) BY MOUTH EVERY DAY   losartan  25 MG tablet Wait to take this until your doctor or other care provider tells you to start again. Commonly known as: COZAAR  TAKE 1 TABLET(25 MG) BY MOUTH DAILY   metoprolol  succinate 100 MG 24 hr tablet Wait to take this until your doctor or other care provider tells you to start again. Commonly known as: TOPROL -XL TAKE 1 TABLET(100 MG) BY MOUTH DAILY WITH OR IMMEDIATELY FOLLOWING A MEAL       TAKE these medications    amoxicillin -clavulanate 875-125 MG tablet Commonly known as: AUGMENTIN  Take 1 tablet by mouth every 12 (twelve) hours for 5 days.   aspirin  81 MG tablet Take 81 mg by mouth every evening.   clopidogrel  75 MG tablet Commonly known as: PLAVIX  TAKE 1 TABLET(75 MG) BY MOUTH DAILY WITH BREAKFAST   Fish Oil 1000 MG Caps Take 1,000 mg by mouth daily.   guaiFENesin  600 MG 12 hr tablet Commonly known as: MUCINEX  Take 2 tablets (1,200 mg total) by mouth 2 (two) times daily for 10 days.   ipratropium-albuterol  0.5-2.5 (3) MG/3ML Soln Commonly known as: DUONEB Take 3 mLs by nebulization every 6 (six) hours as needed (SOB, cough, wheezing).   loperamide  2 MG tablet Commonly known as: IMODIUM  A-D Take 2 mg by mouth 4 (four) times daily as needed for diarrhea or loose stools.   nitroGLYCERIN  0.4 MG SL tablet Commonly known as: NITROSTAT  Place 1 tablet (0.4 mg total) under the tongue every 5 (five) minutes as needed for chest pain.   omeprazole 20 MG capsule Commonly known as: PRILOSEC Take 20 mg by mouth every other day.   ondansetron  4 MG tablet Commonly known as: ZOFRAN  Take 1 tablet (4 mg total) by mouth every 6 (six) hours as needed for nausea.   pravastatin  40 MG tablet Commonly known as: PRAVACHOL  TAKE 1 TABLET(40 MG) BY MOUTH DAILY   traMADol  50 MG tablet Commonly known as: ULTRAM  TAKE 1 TABLET BY MOUTH EVERY 8  HOURS AS NEEDED   Vitamin B-12 500 MCG Subl Place 500 mcg under the tongue daily.               Durable Medical Equipment  (From admission, onward)  Start     Ordered   05/23/23 0951  For home use only DME Nebulizer machine  Once       Question Answer Comment  Patient needs a nebulizer to treat with the following condition Acute bronchospasm   Length of Need Lifetime   Additional equipment included Filter   Additional equipment included Administration kit      05/23/23 0950            Allergies  Allergen Reactions   Cardizem [Diltiazem Hcl] Hives and Rash    Discharge Exam: Vitals:   05/22/23 2347 05/23/23 0751  BP: (!) 152/60 136/61  Pulse:  76  Resp: 18 17  Temp: 98.3 F (36.8 C) 98.8 F (37.1 C)  SpO2: 93% 95%    Physical Exam Vitals and nursing note reviewed.  Constitutional:      General: He is not in acute distress.    Appearance: He is not toxic-appearing or diaphoretic.     Comments: Chronically ill appearing  HENT:     Head: Normocephalic and atraumatic.     Nose: Nose normal.  Cardiovascular:     Rate and Rhythm: Normal rate and regular rhythm.  Pulmonary:     Effort: Pulmonary effort is normal. No respiratory distress.     Breath sounds: Examination of the right-lower field reveals decreased breath sounds. Examination of the left-lower field reveals decreased breath sounds. Decreased breath sounds present.  Abdominal:     General: Bowel sounds are normal. There is no distension.     Palpations: Abdomen is soft.     Tenderness: There is no abdominal tenderness.  Musculoskeletal:     Right lower leg: No edema.     Left lower leg: No edema.  Skin:    General: Skin is warm and dry.     Capillary Refill: Capillary refill takes less than 2 seconds.  Neurological:     General: No focal deficit present.     Mental Status: He is alert and oriented to person, place, and time.     The results of significant diagnostics from  this hospitalization (including imaging, microbiology, ancillary and laboratory) are listed below for reference.    Microbiology: Recent Results (from the past 240 hours)  Resp panel by RT-PCR (RSV, Flu A&B, Covid) Anterior Nasal Swab     Status: None   Collection Time: 05/19/23  1:23 AM   Specimen: Anterior Nasal Swab  Result Value Ref Range Status   SARS Coronavirus 2 by RT PCR NEGATIVE NEGATIVE Final    Comment: (NOTE) SARS-CoV-2 target nucleic acids are NOT DETECTED.  The SARS-CoV-2 RNA is generally detectable in upper respiratory specimens during the acute phase of infection. The lowest concentration of SARS-CoV-2 viral copies this assay can detect is 138 copies/mL. A negative result does not preclude SARS-Cov-2 infection and should not be used as the sole basis for treatment or other patient management decisions. A negative result may occur with  improper specimen collection/handling, submission of specimen other than nasopharyngeal swab, presence of viral mutation(s) within the areas targeted by this assay, and inadequate number of viral copies(<138 copies/mL). A negative result must be combined with clinical observations, patient history, and epidemiological information. The expected result is Negative.  Fact Sheet for Patients:  BloggerCourse.com  Fact Sheet for Healthcare Providers:  SeriousBroker.it  This test is no t yet approved or cleared by the [place]  FDA and  has been authorized for detection and/or diagnosis of SARS-CoV-2 by FDA under an Emergency  Use Authorization (EUA). This EUA will remain  in effect (meaning this test can be used) for the duration of the COVID-19 declaration under Section 564(b)(1) of the Act, 21 U.S.C.section 360bbb-3(b)(1), unless the authorization is terminated  or revoked sooner.       Influenza A by PCR NEGATIVE NEGATIVE Final   Influenza B by PCR NEGATIVE NEGATIVE Final     Comment: (NOTE) The Xpert Xpress SARS-CoV-2/FLU/RSV plus assay is intended as an aid in the diagnosis of influenza from Nasopharyngeal swab specimens and should not be used as a sole basis for treatment. Nasal washings and aspirates are unacceptable for Xpert Xpress SARS-CoV-2/FLU/RSV testing.  Fact Sheet for Patients: BloggerCourse.com  Fact Sheet for Healthcare Providers: SeriousBroker.it  This test is not yet approved or cleared by the [place]  FDA and has been authorized for detection and/or diagnosis of SARS-CoV-2 by FDA under an Emergency Use Authorization (EUA). This EUA will remain in effect (meaning this test can be used) for the duration of the COVID-19 declaration under Section 564(b)(1) of the Act, 21 U.S.C. section 360bbb-3(b)(1), unless the authorization is terminated or revoked.     Resp Syncytial Virus by PCR NEGATIVE NEGATIVE Final    Comment: (NOTE) Fact Sheet for Patients: BloggerCourse.com  Fact Sheet for Healthcare Providers: SeriousBroker.it  This test is not yet approved or cleared by the [place]  FDA and has been authorized for detection and/or diagnosis of SARS-CoV-2 by FDA under an Emergency Use Authorization (EUA). This EUA will remain in effect (meaning this test can be used) for the duration of the COVID-19 declaration under Section 564(b)(1) of the Act, 21 U.S.C. section 360bbb-3(b)(1), unless the authorization is terminated or revoked.  Performed at [facility], [address] [place], [place] [address]   Blood culture (routine x 2)     Status: None (Preliminary result)   Collection Time: 05/19/23  4:35 AM   Specimen: BLOOD RIGHT HAND  Result Value Ref Range Status   Specimen Description BLOOD RIGHT HAND  Final   Special Requests   Final    BOTTLES DRAWN AEROBIC AND ANAEROBIC Blood Culture results may not be optimal due  to an inadequate volume of blood received in culture bottles   Culture   Final    NO GROWTH 4 DAYS Performed at [facility], [address] [place], [place] [address]    Report Status PENDING  Incomplete  Blood culture (routine x 2)     Status: None (Preliminary result)   Collection Time: 05/19/23  4:36 AM   Specimen: BLOOD RIGHT ARM  Result Value Ref Range Status   Specimen Description BLOOD RIGHT ARM  Final   Special Requests   Final    BOTTLES DRAWN AEROBIC AND ANAEROBIC Blood Culture results may not be optimal due to an inadequate volume of blood received in culture bottles   Culture   Final    NO GROWTH 4 DAYS Performed at [facility], [address] [place], [place] [address]    Report Status PENDING  Incomplete     Labs:  Basic Metabolic Panel: Recent Labs  Lab 05/18/23 2309 05/19/23 1611 05/20/23 0611 05/21/23 0653 05/22/23 0547 05/23/23 0343  NA 124* 127* 128* 132* 133* 135  K 3.2*  --  3.4* 3.2* 3.4* 3.3*  CL 90*  --  95* 97* 100 100  CO2 22  --  24 24 25 22   GLUCOSE 114*  --  93 94 95 101*  BUN 34*  --  25* 21 22 28*  CREATININE 1.54*  --  1.47* 1.26* 1.28* 1.33*  CALCIUM 8.4*  --  8.3* 8.1* 8.2* 8.1*  MG  --   --   --  2.1 2.1 2.1  PHOS  --   --   --  3.1 3.4 3.8   Liver Function Tests: Recent Labs  Lab 05/18/23 2309 05/21/23 0653 05/22/23 0547 05/23/23 0343  AST 107* 94* 118* 206*  ALT 69* 91* 110* 160*  ALKPHOS 86 129* 138* 156*  BILITOT 0.6 0.9 0.5 0.6  PROT 7.5 6.9 6.8 6.7  ALBUMIN  3.1* 2.6* 2.4* 2.4*   CBC: Recent Labs  Lab 05/18/23 2309 05/20/23 0611 05/21/23 0653 05/22/23 0547 05/23/23 0343  WBC 2.3* 2.0* 2.3* 3.1* 3.4*  NEUTROABS  --   --  1.2* 1.6* 1.8  HGB 7.7* 7.6* 7.7* 7.2* 7.3*  HCT 22.8* 22.6* 22.5* 21.2* 21.0*  MCV 107.5* 108.7* 105.6* 106.0* 102.9*  PLT 58* 55* 54* 59* 53*   Urinalysis    Component Value Date/Time   COLORURINE YELLOW (A) 05/18/2023 2306   APPEARANCEUR HAZY (A) 05/18/2023  2306   LABSPEC 1.015 05/18/2023 2306   PHURINE 5.0 05/18/2023 2306   GLUCOSEU NEGATIVE 05/18/2023 2306   HGBUR SMALL (A) 05/18/2023 2306   BILIRUBINUR NEGATIVE 05/18/2023 2306   KETONESUR NEGATIVE 05/18/2023 2306   PROTEINUR 30 (A) 05/18/2023 2306   NITRITE NEGATIVE 05/18/2023 2306   LEUKOCYTESUR NEGATIVE 05/18/2023 2306   Sepsis Labs Recent Labs  Lab 05/20/23 0611 05/21/23 0653 05/22/23 0547 05/23/23 0343  WBC 2.0* 2.3* 3.1* 3.4*   Microbiology Recent Results (from the past 240 hours)  Resp panel by RT-PCR (RSV, Flu A&B, Covid) Anterior Nasal Swab     Status: None   Collection Time: 05/19/23  1:23 AM   Specimen: Anterior Nasal Swab  Result Value Ref Range Status   SARS Coronavirus 2 by RT PCR NEGATIVE NEGATIVE Final    Comment: (NOTE) SARS-CoV-2 target nucleic acids are NOT DETECTED.  The SARS-CoV-2 RNA is generally detectable in upper respiratory specimens during the acute phase of infection. The lowest concentration of SARS-CoV-2 viral copies this assay can detect is 138 copies/mL. A negative result does not preclude SARS-Cov-2 infection and should not be used as the sole basis for treatment or other patient management decisions. A negative result may occur with  improper specimen collection/handling, submission of specimen other than nasopharyngeal swab, presence of viral mutation(s) within the areas targeted by this assay, and inadequate number of viral copies(<138 copies/mL). A negative result must be combined with clinical observations, patient history, and epidemiological information. The expected result is Negative.  Fact Sheet for Patients:  BloggerCourse.com  Fact Sheet for Healthcare Providers:  SeriousBroker.it  This test is no t yet approved or cleared by the [place]  FDA and  has been authorized for detection and/or diagnosis of SARS-CoV-2 by FDA under an Emergency Use Authorization (EUA). This EUA  will remain  in effect (meaning this test can be used) for the duration of the COVID-19 declaration under Section 564(b)(1) of the Act, 21 U.S.C.section 360bbb-3(b)(1), unless the authorization is terminated  or revoked sooner.       Influenza A by PCR NEGATIVE NEGATIVE Final   Influenza B by PCR NEGATIVE NEGATIVE Final    Comment: (NOTE) The Xpert Xpress SARS-CoV-2/FLU/RSV plus assay is intended as an aid in the diagnosis of influenza from Nasopharyngeal swab specimens and should not be used as a sole basis for treatment. Nasal washings and aspirates  are unacceptable for Xpert Xpress SARS-CoV-2/FLU/RSV testing.  Fact Sheet for Patients: BloggerCourse.com  Fact Sheet for Healthcare Providers: SeriousBroker.it  This test is not yet approved or cleared by the [place]  FDA and has been authorized for detection and/or diagnosis of SARS-CoV-2 by FDA under an Emergency Use Authorization (EUA). This EUA will remain in effect (meaning this test can be used) for the duration of the COVID-19 declaration under Section 564(b)(1) of the Act, 21 U.S.C. section 360bbb-3(b)(1), unless the authorization is terminated or revoked.     Resp Syncytial Virus by PCR NEGATIVE NEGATIVE Final    Comment: (NOTE) Fact Sheet for Patients: BloggerCourse.com  Fact Sheet for Healthcare Providers: SeriousBroker.it  This test is not yet approved or cleared by the [place]  FDA and has been authorized for detection and/or diagnosis of SARS-CoV-2 by FDA under an Emergency Use Authorization (EUA). This EUA will remain in effect (meaning this test can be used) for the duration of the COVID-19 declaration under Section 564(b)(1) of the Act, 21 U.S.C. section 360bbb-3(b)(1), unless the authorization is terminated or revoked.  Performed at [facility], [address] [place], [place]  [address]   Blood culture (routine x 2)     Status: None (Preliminary result)   Collection Time: 05/19/23  4:35 AM   Specimen: BLOOD RIGHT HAND  Result Value Ref Range Status   Specimen Description BLOOD RIGHT HAND  Final   Special Requests   Final    BOTTLES DRAWN AEROBIC AND ANAEROBIC Blood Culture results may not be optimal due to an inadequate volume of blood received in culture bottles   Culture   Final    NO GROWTH 4 DAYS Performed at [facility], [address] [place], [place] [address]    Report Status PENDING  Incomplete  Blood culture (routine x 2)     Status: None (Preliminary result)   Collection Time: 05/19/23  4:36 AM   Specimen: BLOOD RIGHT ARM  Result Value Ref Range Status   Specimen Description BLOOD RIGHT ARM  Final   Special Requests   Final    BOTTLES DRAWN AEROBIC AND ANAEROBIC Blood Culture results may not be optimal due to an inadequate volume of blood received in culture bottles   Culture   Final    NO GROWTH 4 DAYS Performed at [facility], [address] [place], [place] [address]    Report Status PENDING  Incomplete    Procedures/Studies: US  CHEST (PLEURAL EFFUSION) Result Date: 05/22/2023 INDICATION: History of worsening shortness of breath. Found to have a small right-sided pleural effusion. Request is for therapeutic and diagnostic right-sided thoracentesis EXAM: CHEST ULTRASOUND COMPARISON:  CT angio chest dated May 21, 2023 FINDINGS: Small right-sided pleural effusion. IMPRESSION: Small left-sided pleural effusion. After discussion of the risks versus benefits of the procedure the patient elected to defer the thoracentesis at this time. Read by: Reagan Camera, NP Electronically Signed   By: Melven Stable.  Shick M.D.   On: 05/22/2023 11:10   CT Angio Chest Pulmonary Embolism (PE) W or WO Contrast Result Date: 05/21/2023 CLINICAL DATA:  Worsening shortness of breath and tachypnea this morning. Pulmonary embolism suspected. Possible  necrotizing pneumonia. EXAM: CT ANGIOGRAPHY CHEST WITH CONTRAST TECHNIQUE: Multidetector CT imaging of the chest was performed using the standard protocol during bolus administration of intravenous contrast. Multiplanar CT image reconstructions and MIPs were obtained to evaluate the vascular anatomy. RADIATION DOSE REDUCTION: This exam was performed according to the departmental dose-optimization program which includes automated  exposure control, adjustment of the mA and/or kV according to patient size and/or use of iterative reconstruction technique. CONTRAST:  75mL OMNIPAQUE  IOHEXOL  350 MG/ML SOLN COMPARISON:  Chest radiographs and abdominal CT 05/19/2023. No prior chest CT. FINDINGS: Cardiovascular: The pulmonary arteries are well opacified with contrast to the level of the segmental branches. There is no evidence of acute pulmonary embolism. No acute systemic arterial abnormalities are identified. There is atherosclerosis of the aorta, great vessels and coronary arteries. The heart size is normal. There is no pericardial effusion. Mediastinum/Nodes: Mildly enlarged right paratracheal node measures 1.5 cm short axis on image 114/5. There are prominent right hilar lymph nodes measuring up to 1 cm in diameter. There is right pericardiac nodularity which may reflect small lymph nodes or pleural base nodules. No other enlarged mediastinal or axillary lymph nodes are seen. The thyroid gland, trachea and esophagus demonstrate no significant findings. Lungs/Pleura: Small dependent right pleural effusion has increased in volume compared with the recent abdominal CT. Interval increased volume loss in the right lower lobe with associated consolidation or ill-defined mass, slightly increased from the recent prior study. There are scattered small pulmonary nodules bilaterally which are primarily subpleural in location, including a 6 mm right middle lobe nodule on image 81/6, a 6 mm lingular nodule on image 82/6 and a 7 mm  left lower lobe nodule on image 100/6, unchanged from recent abdominal CT. Additional scattered nodules are present measuring up to 6 mm anteriorly in the left upper lobe on image 37/6. Upper abdomen: No acute findings are seen in the visualized upper abdomen. Musculoskeletal/Chest wall: There is no chest wall mass or suspicious osseous finding. Mild multilevel spondylosis. Review of the MIP images confirms the above findings. IMPRESSION: 1. No evidence of acute pulmonary embolism or other acute vascular findings in the chest. 2. Interval increased volume loss in the right lower lobe with associated consolidation and enlarging adjacent pleural effusion, suspicious for pneumonia. Radiographic follow-up recommended to ensure resolution. 3. Scattered small pulmonary nodules bilaterally, primarily subpleural in location, nonspecific. Metastatic disease not excluded. Non-contrast chest CT at 3-6 months is recommended. If the nodules are stable at time of repeat CT, then future CT at 18-24 months (from today's scan) is considered optional for low-risk patients, but is recommended for high-risk patients. This recommendation follows the consensus statement: Guidelines for Management of Incidental Pulmonary Nodules Detected on CT Images: From the Fleischner Society 2017; Radiology 2017; 284:228-243. 4. Mildly enlarged right paratracheal and right hilar lymph nodes, also nonspecific and potentially reactive. Recommend attention on follow-up CT. 5.  Aortic Atherosclerosis (ICD10-I70.0). Electronically Signed   By: Elmon Hagedorn M.D.   On: 05/21/2023 11:39   CT ABDOMEN PELVIS W CONTRAST Result Date: 05/19/2023 CLINICAL DATA:  Right groin pain x2 days. EXAM: CT ABDOMEN AND PELVIS WITH CONTRAST TECHNIQUE: Multidetector CT imaging of the abdomen and pelvis was performed using the standard protocol following bolus administration of intravenous contrast. RADIATION DOSE REDUCTION: This exam was performed according to the  departmental dose-optimization program which includes automated exposure control, adjustment of the mA and/or kV according to patient size and/or use of iterative reconstruction technique. CONTRAST:  OMNIPAQUE  IOHEXOL  300 MG/ML  SOLN COMPARISON:  March 03, 2004 FINDINGS: Lower chest: A 5 mm pleural based lung nodule is seen within the anterior medial aspect of the right lower lobe (axial CT image 15, CT series 4). A 5 mm lung nodule is seen within the posterolateral aspect of the left upper lobe (axial CT image  2, CT series 4). An additional 2 mm lung nodule is seen within the posterolateral aspect of the left lower lobe (axial CT image 15, CT series 4). Moderate to marked severity atelectasis and/or infiltrate is present within the posterior aspect of the right lower lobe. A 6.4 cm x 2.3 cm x 4.8 cm mass-like area of heterogeneous low attenuation is seen within the adjacent portion of the posteromedial right lung base. A very small right pleural effusion is noted. Hepatobiliary: No focal liver abnormality is seen. No gallstones, gallbladder wall thickening, or biliary dilatation. Pancreas: Unremarkable. No pancreatic ductal dilatation or surrounding inflammatory changes. Spleen: Normal in size without focal abnormality. Adrenals/Urinary Tract: Adrenal glands are unremarkable. Kidneys are normal, without renal calculi, focal lesion, or hydronephrosis. Bladder is unremarkable. Stomach/Bowel: Stomach is within normal limits. The appendix is not identified, with multiple surgical clips seen within the region posterior to the cecum. No evidence of bowel wall thickening, distention, or inflammatory changes. Small, noninflamed diverticula are seen throughout the sigmoid colon. Vascular/Lymphatic: Extensive aortic atherosclerosis with extensive calcification and atherosclerosis of the arterial structures of the pelvis and visualized lower extremities. A right common iliac artery stent is seen. Bypass graft material  is seen along the left common femoral artery. No enlarged abdominal or pelvic lymph nodes. Reproductive: The prostate gland is mildly enlarged. Other: A 2.0 cm x 2.3 cm x 2.2 cm fat containing umbilical hernia is noted. No abdominopelvic ascites. Musculoskeletal: Marked severity multilevel degenerative changes seen throughout the lumbar spine. IMPRESSION: 1. Moderate to marked severity right lower lobe atelectasis and/or infiltrate with a mass-like area of heterogeneous low attenuation within the adjacent portion of the posteromedial right lung base. While this may represent a focal area of necrotizing pneumonia, correlation with dedicated chest CT and follow-up to resolution is recommended to exclude the presence of an underlying neoplastic process. 2. Very small right pleural effusion. 3. Multiple bilateral lung nodules, as described above. Dedicated nonemergent chest CT is again recommended. 4. Sigmoid diverticulosis. 5. Small fat containing umbilical hernia. 6. 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 7. Aortic atherosclerosis. Aortic Atherosclerosis (ICD10-I70.0). Electronically Signed   By: Virgle Grime M.D.   On: 05/19/2023 03:25   CT Hip Right Wo Contrast Result Date: 05/19/2023 CLINICAL DATA:  Right groin pain. EXAM: CT OF THE RIGHT HIP WITHOUT CONTRAST TECHNIQUE: Multidetector CT imaging of the right hip was performed according to the standard protocol. Multiplanar CT image reconstructions were also generated. RADIATION DOSE REDUCTION: This exam was performed according to the departmental dose-optimization program which includes automated exposure control, adjustment of the mA and/or kV according to patient size and/or use of iterative reconstruction technique. COMPARISON:  None Available. FINDINGS: Bones/Joint/Cartilage There is no evidence of an acute fracture or dislocation. Mild degenerative changes are seen involving the right hip in the form of joint space  narrowing, acetabular sclerosis and lateral acetabular bony spurring. Moderate severity degenerative changes are also seen along the visualized portion of the right sacroiliac joint. Ligaments Suboptimally assessed by CT. Muscles and Tendons Unremarkable. Soft tissues There is marked severity vascular calcification. Mild to moderate severity prostatomegaly is also seen. No hernia is identified. IMPRESSION: 1. No acute osseous abnormality. 2. Mild degenerative changes involving the right hip. 3. Moderate severity degenerative changes along the visualized portion of the right sacroiliac joint. 4. Mild to moderate severity prostatomegaly. Electronically Signed   By: Virgle Grime M.D.   On: 05/19/2023 02:47   US  Venous Img Lower Unilateral Right Result  Date: 05/19/2023 CLINICAL DATA:  cough, right leg pain EXAM: Right LOWER EXTREMITY VENOUS DOPPLER ULTRASOUND TECHNIQUE: Gray-scale sonography with compression, as well as color and duplex ultrasound, were performed to evaluate the deep venous system(s) from the level of the common femoral vein through the popliteal and proximal calf veins. COMPARISON:  None Available. FINDINGS: VENOUS Normal compressibility of the common femoral, superficial femoral, and popliteal veins, as well as the visualized calf veins. Visualized portions of profunda femoral vein and great saphenous vein unremarkable. No filling defects to suggest DVT on grayscale or color Doppler imaging. Doppler waveforms show normal direction of venous flow, normal respiratory plasticity and response to augmentation. Limited views of the contralateral common femoral vein are unremarkable. OTHER None. Limitations: none IMPRESSION: Negative. Electronically Signed   By: Morgane  Naveau M.D.   On: 05/19/2023 00:41   DG Chest 2 View Result Date: 05/19/2023 CLINICAL DATA:  cough, right leg pain EXAM: CHEST - 2 VIEW COMPARISON:  Chest x-ray 12/09/2021 FINDINGS: The heart and mediastinal contours are grossly  unchanged given low lung volumes. Low lung volumes. No focal consolidation. No pulmonary edema. No pleural effusion. No pneumothorax. No acute osseous abnormality. IMPRESSION: Low lung volumes with no active cardiopulmonary disease. Electronically Signed   By: Morgane  Naveau M.D.   On: 05/19/2023 00:37    Time coordinating discharge: 45 mins  SIGNED:  Unk Garb, DO [facility] 05/23/23, 2:54 PM

## 2023-05-23 NOTE — Progress Notes (Signed)
 Mobility Specialist - Progress Note  Post-mobility: HR 103, SPO2 95%   05/23/23 1354  Mobility  Activity Ambulated with assistance in hallway  Level of Assistance Standby assist, set-up cues, supervision of patient - no hands on  Assistive Device Front wheel walker  Distance Ambulated (ft) 80 ft  Activity Response Tolerated well  Mobility visit 1 Mobility  Mobility Specialist Start Time (ACUTE ONLY) 1332  Mobility Specialist Stop Time (ACUTE ONLY) 1346  Mobility Specialist Time Calculation (min) (ACUTE ONLY) 14 min   Pt supine upon entry, utilizing RA. Pt agreeable to OOB amb in the hallway this date, denied knee soreness. Pt completed bed mob ModI, STS to RW MinG and amb 80 ft in the hallway MinG-SBA-- min cuing to hold head up/forward during amb, no coughing noted during amb. Pt returned to the room, left supine with needs within reach. RN notified.   Versa Gore Mobility Specialist 05/23/23 1:59 PM

## 2023-05-23 NOTE — Subjective & Objective (Signed)
 Pt seen and examined. Met with pt and wife at bedside. Pt is feeling better wants to go home. Did not qualify for home O2. 88% O2 sats while sleeping. Did not require O2 while ambulating.

## 2023-05-23 NOTE — Progress Notes (Signed)
 PROGRESS NOTE    Aaron Sexton  UJW:[id number] DOB: 1946-02-07 DOA: 05/18/2023 PCP: Helaine Llanos, MD  Subjective: Pt seen and examined. Met with pt and wife at bedside. Pt is feeling better wants to go home. Did not qualify for home O2. 88% O2 sats while sleeping. Did not require O2 while ambulating.    Hospital Course: HPI: Aaron Sexton is a 78 y.o. 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   Patient started to have a productive cough about 1 week ago, with occasional whitish phlegm production, denied any chest pain no fever or chills.  Denied any urinary symptoms such as runny nose sore throat, denied any sick contact.  Last few days patient has developed generalized weakness and 2 days ago he started to have severe right hip pain, located on the right groin area nonradiating, constant worsening with movement and cough, denied any radiation pain, denied any claudications.  Patient denied any trouble swallowing and no choke coughing after eating or drinking.  Significant Events: Admitted 05/18/2023 for RLL pneumoniia   Significant Labs: WBC 3.3, hemoglobin 7.7, platelet 58, sodium 124, K3.2, creatinine 1.5 bicarb 22.   Significant Imaging Studies: 05-19-2023 CT abdomen pelvis incidental finding of right lower lobe pneumonia, suspicious for necrotizing pneumonia with question of underlying mass 05-19-2023 Right hip CT showed mild OA, severe right-sided sacroiliac OA  05-19-2023 Right LE U/S negative for DVT 05-21-2023 CTPA No evidence of acute pulmonary embolism or other acute vascular findings in the chest. 2. Interval increased volume loss in the right lower lobe with  associated consolidation and enlarging adjacent pleural effusion, suspicious for pneumonia. Radiographic follow-up recommended to ensure resolution. 3. Scattered small pulmonary nodules bilaterally, primarily subpleural in location, nonspecific. Metastatic disease not excluded. Non-contrast chest CT at 3-6 months is recommended. 4. Mildly enlarged right paratracheal and right hilar lymph nodes, also nonspecific and potentially reactive. Recommend attention on follow-up CT. 5.  Aortic Atherosclerosis  Antibiotic Therapy: Anti-infectives (From admission, onward)    Start     Dose/Rate Route Frequency Ordered Stop   05/21/23 1000  azithromycin  (ZITHROMAX ) tablet 500 mg        500 mg Oral Daily 05/20/23 1121     05/20/23 1000  cefTRIAXone  (ROCEPHIN ) 2 g in sodium chloride  0.9 % 100 mL IVPB  Status:  Discontinued        2 g 200 mL/hr over 30 Minutes Intravenous Every 24 hours 05/19/23 0858 05/19/23 0901   05/20/23 1000  azithromycin  (ZITHROMAX ) 500 mg in sodium chloride  0.9 % 250 mL IVPB        500 mg 250 mL/hr over 60 Minutes Intravenous Every 24 hours 05/19/23 0859 05/20/23 1758   05/19/23 1600  Ampicillin -Sulbactam (UNASYN ) 3 g in sodium chloride  0.9 % 100 mL IVPB        3 g 200 mL/hr over 30 Minutes Intravenous Every 6 hours 05/19/23 0907     05/19/23 0330  cefTRIAXone  (ROCEPHIN ) 2 g in sodium chloride  0.9 % 100 mL IVPB        2 g 200 mL/hr over 30 Minutes Intravenous  Once 05/19/23 0329 05/19/23 0546   05/19/23 0330  azithromycin  (ZITHROMAX ) 500 mg in sodium chloride  0.9 % 250 mL IVPB  500 mg 250 mL/hr over 60 Minutes Intravenous  Once 05/19/23 0329 05/19/23 1610       Procedures:   Consultants:     Assessment and Plan: * Community acquired pneumonia 05-23-2023 procal 0.19. WBC is 3.4. blood cx still no growth after 4 days. Change to po augmentin  875 mg bid. Has completed 5 days of IV/po zithromax  500 mg daily.  MDS (myelodysplastic syndrome) (HCC) 05-23-2023 follows with  heme/onc at [facility]. HgB 7.3 g/dl. I offered to give him a PRBC transfusion before he left the hospital. Pt refuses and want to f/u with his heme/onc provider to decide if he needs PRBC transfusion.  Hyponatremia 05-23-2023 due to chlorthalidone . This has been stopped.  CKD stage 3a, GFR 45-59 ml/min (HCC) - baseline Scr 1.2-1.6 05-23-2023 Scr 1.33 today.   Right hip pain 05-23-2023 due to OA on xray.  Essential hypertension, benign 05-23-2023 off chlorthalidone  due to hyponatremia. BP is stable off HTN meds for now. F/u with PCP after discharge.  Gastroesophageal reflux disease 05-23-2023 stable. On protonix  40 mg qday.  Hypokalemia 05-23-2023 replete with po kcl.  DVT prophylaxis: SCDs Start: 05/19/23 0855     Code Status: Full Code Family Communication: discussed with pt and wife Annette at bedside Disposition Plan: return home Reason for continuing need for hospitalization: medically stable for DC.  Objective: Vitals:   05/22/23 1700 05/22/23 2150 05/22/23 2347 05/23/23 0751  BP:   (!) 152/60 136/61  Pulse:    76  Resp:   18 17  Temp:   98.3 F (36.8 C) 98.8 F (37.1 C)  TempSrc:    Oral  SpO2: 93% (!) 88% 93% 95%  Weight:      Height:        Intake/Output Summary (Last 24 hours) at 05/23/2023 1310 Last data filed at 05/23/2023 0946 Gross per 24 hour  Intake 300 ml  Output --  Net 300 ml   Filed Weights   05/18/23 2251  Weight: 88.5 kg    Examination:  Physical Exam Vitals and nursing note reviewed.  Constitutional:      General: He is not in acute distress.    Appearance: He is not toxic-appearing or diaphoretic.     Comments: Chronically ill appearing  HENT:     Head: Normocephalic and atraumatic.     Nose: Nose normal.  Cardiovascular:     Rate and Rhythm: Normal rate and regular rhythm.  Pulmonary:     Effort: Pulmonary effort is normal. No respiratory distress.     Breath sounds: Examination of the right-lower field reveals decreased  breath sounds. Examination of the left-lower field reveals decreased breath sounds. Decreased breath sounds present.  Abdominal:     General: Bowel sounds are normal. There is no distension.     Palpations: Abdomen is soft.     Tenderness: There is no abdominal tenderness.  Musculoskeletal:     Right lower leg: No edema.     Left lower leg: No edema.  Skin:    General: Skin is warm and dry.     Capillary Refill: Capillary refill takes less than 2 seconds.  Neurological:     General: No focal deficit present.     Mental Status: He is alert and oriented to person, place, and time.     Data Reviewed: I have personally reviewed following labs and imaging studies  CBC: Recent Labs  Lab 05/18/23 2309 05/20/23 0611 05/21/23 0653 05/22/23 0547 05/23/23 0343  WBC 2.3* 2.0* 2.3*  3.1* 3.4*  NEUTROABS  --   --  1.2* 1.6* 1.8  HGB 7.7* 7.6* 7.7* 7.2* 7.3*  HCT 22.8* 22.6* 22.5* 21.2* 21.0*  MCV 107.5* 108.7* 105.6* 106.0* 102.9*  PLT 58* 55* 54* 59* 53*   Basic Metabolic Panel: Recent Labs  Lab 05/18/23 2309 05/19/23 1611 05/20/23 0611 05/21/23 0653 05/22/23 0547 05/23/23 0343  NA 124* 127* 128* 132* 133* 135  K 3.2*  --  3.4* 3.2* 3.4* 3.3*  CL 90*  --  95* 97* 100 100  CO2 22  --  24 24 25 22   GLUCOSE 114*  --  93 94 95 101*  BUN 34*  --  25* 21 22 28*  CREATININE 1.54*  --  1.47* 1.26* 1.28* 1.33*  CALCIUM 8.4*  --  8.3* 8.1* 8.2* 8.1*  MG  --   --   --  2.1 2.1 2.1  PHOS  --   --   --  3.1 3.4 3.8   GFR: Estimated Creatinine Clearance: 47.8 mL/min (A) (by C-G formula based on SCr of 1.33 mg/dL (H)). Liver Function Tests: Recent Labs  Lab 05/18/23 2309 05/21/23 0653 05/22/23 0547 05/23/23 0343  AST 107* 94* 118* 206*  ALT 69* 91* 110* 160*  ALKPHOS 86 129* 138* 156*  BILITOT 0.6 0.9 0.5 0.6  PROT 7.5 6.9 6.8 6.7  ALBUMIN  3.1* 2.6* 2.4* 2.4*   Sepsis Labs: Recent Labs  Lab 05/23/23 0343  PROCALCITON 0.19    Recent Results (from the past 240 hours)   Resp panel by RT-PCR (RSV, Flu A&B, Covid) Anterior Nasal Swab     Status: None   Collection Time: 05/19/23  1:23 AM   Specimen: Anterior Nasal Swab  Result Value Ref Range Status   SARS Coronavirus 2 by RT PCR NEGATIVE NEGATIVE Final    Comment: (NOTE) SARS-CoV-2 target nucleic acids are NOT DETECTED.  The SARS-CoV-2 RNA is generally detectable in upper respiratory specimens during the acute phase of infection. The lowest concentration of SARS-CoV-2 viral copies this assay can detect is 138 copies/mL. A negative result does not preclude SARS-Cov-2 infection and should not be used as the sole basis for treatment or other patient management decisions. A negative result may occur with  improper specimen collection/handling, submission of specimen other than nasopharyngeal swab, presence of viral mutation(s) within the areas targeted by this assay, and inadequate number of viral copies(<138 copies/mL). A negative result must be combined with clinical observations, patient history, and epidemiological information. The expected result is Negative.  Fact Sheet for Patients:  BloggerCourse.com  Fact Sheet for Healthcare Providers:  SeriousBroker.it  This test is no t yet approved or cleared by the [place]  FDA and  has been authorized for detection and/or diagnosis of SARS-CoV-2 by FDA under an Emergency Use Authorization (EUA). This EUA will remain  in effect (meaning this test can be used) for the duration of the COVID-19 declaration under Section 564(b)(1) of the Act, 21 U.S.C.section 360bbb-3(b)(1), unless the authorization is terminated  or revoked sooner.       Influenza A by PCR NEGATIVE NEGATIVE Final   Influenza B by PCR NEGATIVE NEGATIVE Final    Comment: (NOTE) The Xpert Xpress SARS-CoV-2/FLU/RSV plus assay is intended as an aid in the diagnosis of influenza from Nasopharyngeal swab specimens and should not be used  as a sole basis for treatment. Nasal washings and aspirates are unacceptable for Xpert Xpress SARS-CoV-2/FLU/RSV testing.  Fact Sheet for Patients: BloggerCourse.com  Fact Sheet for Healthcare  Providers: SeriousBroker.it  This test is not yet approved or cleared by the [place]  FDA and has been authorized for detection and/or diagnosis of SARS-CoV-2 by FDA under an Emergency Use Authorization (EUA). This EUA will remain in effect (meaning this test can be used) for the duration of the COVID-19 declaration under Section 564(b)(1) of the Act, 21 U.S.C. section 360bbb-3(b)(1), unless the authorization is terminated or revoked.     Resp Syncytial Virus by PCR NEGATIVE NEGATIVE Final    Comment: (NOTE) Fact Sheet for Patients: BloggerCourse.com  Fact Sheet for Healthcare Providers: SeriousBroker.it  This test is not yet approved or cleared by the [place]  FDA and has been authorized for detection and/or diagnosis of SARS-CoV-2 by FDA under an Emergency Use Authorization (EUA). This EUA will remain in effect (meaning this test can be used) for the duration of the COVID-19 declaration under Section 564(b)(1) of the Act, 21 U.S.C. section 360bbb-3(b)(1), unless the authorization is terminated or revoked.  Performed at [facility], [address] [place], [place] [address]   Blood culture (routine x 2)     Status: None (Preliminary result)   Collection Time: 05/19/23  4:35 AM   Specimen: BLOOD RIGHT HAND  Result Value Ref Range Status   Specimen Description BLOOD RIGHT HAND  Final   Special Requests   Final    BOTTLES DRAWN AEROBIC AND ANAEROBIC Blood Culture results may not be optimal due to an inadequate volume of blood received in culture bottles   Culture   Final    NO GROWTH 4 DAYS Performed at [facility], [address], [place], [place]  [address]    Report Status PENDING  Incomplete  Blood culture (routine x 2)     Status: None (Preliminary result)   Collection Time: 05/19/23  4:36 AM   Specimen: BLOOD RIGHT ARM  Result Value Ref Range Status   Specimen Description BLOOD RIGHT ARM  Final   Special Requests   Final    BOTTLES DRAWN AEROBIC AND ANAEROBIC Blood Culture results may not be optimal due to an inadequate volume of blood received in culture bottles   Culture   Final    NO GROWTH 4 DAYS Performed at [facility], [address] [place], [place] [address]    Report Status PENDING  Incomplete     Radiology Studies: US  CHEST (PLEURAL EFFUSION) Result Date: 05/22/2023 INDICATION: History of worsening shortness of breath. Found to have a small right-sided pleural effusion. Request is for therapeutic and diagnostic right-sided thoracentesis EXAM: CHEST ULTRASOUND COMPARISON:  CT angio chest dated May 21, 2023 FINDINGS: Small right-sided pleural effusion. IMPRESSION: Small left-sided pleural effusion. After discussion of the risks versus benefits of the procedure the patient elected to defer the thoracentesis at this time. Read by: Reagan Camera, NP Electronically Signed   By: Melven Stable.  Shick M.D.   On: 05/22/2023 11:10    Scheduled Meds:  amoxicillin -clavulanate  1 tablet Oral Q12H   aspirin  EC  81 mg Oral QPM   clopidogrel   75 mg Oral Daily   guaiFENesin   1,200 mg Oral BID   pantoprazole   40 mg Oral Daily   potassium chloride   40 mEq Oral Once   pravastatin   40 mg Oral q1800   Continuous Infusions:   LOS: 4 days   Time spent: 45 minutes  Unk Garb, DO  [facility]  05/23/2023, 1:10 PM

## 2023-05-23 NOTE — Assessment & Plan Note (Signed)
 05-23-2023 replete with po kcl.

## 2023-05-23 NOTE — Assessment & Plan Note (Signed)
 05-23-2023 due to OA on xray.

## 2023-05-23 NOTE — Assessment & Plan Note (Signed)
 05-23-2023 Scr 1.33 today.

## 2023-05-23 NOTE — Discharge Instructions (Addendum)
 [facility] Address: [address] [place], [place], [place] [address] Hours:  Open 24 hours Phone: [phone]  They will call you to set up when they can come for assessment

## 2023-05-23 NOTE — Assessment & Plan Note (Addendum)
 05-23-2023 off chlorthalidone  due to hyponatremia. BP is stable off HTN meds for now. F/u with PCP after discharge.  *update. All of pt's HTN meds were held during his hospitalization due to poor po intake and borderline BP. Pt's lopressor , losartan  and chlorthalidone  will all be held at discharge. Pt to f/u with PCP to restart HTN meds if needed. At this point, I would highly recommended only restarting 1 medication at a time. If it impairs his renal function such as chlorthalidone  or losartan , he will need BMET/BMP 1 week after initiation of therapy.

## 2023-05-23 NOTE — TOC Progression Note (Addendum)
 Transition of Care [facility]) - Progression Note    Patient Details  Name: Aaron Sexton MRN: [id number] Date of Birth: 1946-04-10  Transition of Care [facility]) CM/SW Contact  Abagail Hoar, RN Phone Number: 05/23/2023, 1:04 PM  Clinical Narrative:     The patient lives at home with spouse, his family would like him to go home with [facility] since he is not at his baseline, they want to use [facility] out of [place]  I reached out to [facility]  (Cory)and they accept him Spoke with Spouse and let them know  Expected Discharge Plan: Home w Home Health Services Barriers to Discharge: Barriers Resolved  Expected Discharge Plan and Services   Discharge Planning Services: CM Consult   Living arrangements for the past 2 months: Skilled Nursing Facility                 DME Arranged: N/A         HH Arranged: PT, OT HH Agency: [facility] Date [facility] Agency Contacted: 05/23/23 Time HH Agency Contacted: 1303 Representative spoke with at [facility] Agency: Randel Buss   Social Determinants of Health (SDOH) Interventions SDOH Screenings   Food Insecurity: No Food Insecurity (05/21/2023)  Housing: Low Risk  (05/21/2023)  Transportation Needs: No Transportation Needs (05/21/2023)  Utilities: At Risk (05/21/2023)  Alcohol Screen: Low Risk  (03/10/2020)  Depression (PHQ2-9): Low Risk  (02/12/2023)  Financial Resource Strain: Low Risk  (03/10/2020)  Physical Activity: Inactive (03/10/2020)  Social Connections: Moderately Integrated (05/21/2023)  Stress: No Stress Concern Present (03/10/2020)  Tobacco Use: Medium Risk (05/18/2023)    Readmission Risk Interventions    01/12/2022   10:42 AM 12/19/2021   10:04 AM 12/13/2021   10:33 AM  Readmission Risk Prevention Plan  Transportation Screening Complete Complete Complete  PCP or Specialist Appt within 5-7 Days Complete Complete Complete  Home Care Screening Complete Complete Complete  Medication Review (RN CM) Complete Complete Complete

## 2023-05-23 NOTE — Hospital Course (Signed)
 HPI: Aaron Sexton is a 78 y.o. 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   Patient started to have a productive cough about 1 week ago, with occasional whitish phlegm production, denied any chest pain no fever or chills.  Denied any urinary symptoms such as runny nose sore throat, denied any sick contact.  Last few days patient has developed generalized weakness and 2 days ago he started to have severe right hip pain, located on the right groin area nonradiating, constant worsening with movement and cough, denied any radiation pain, denied any claudications.  Patient denied any trouble swallowing and no choke coughing after eating or drinking.  Significant Events: Admitted 05/18/2023 for RLL pneumoniia   Significant Labs: WBC 3.3, hemoglobin 7.7, platelet 58, sodium 124, K3.2, creatinine 1.5 bicarb 22.   Significant Imaging Studies: 05-19-2023 CT abdomen pelvis incidental finding of right lower lobe pneumonia, suspicious for necrotizing pneumonia with question of underlying mass 05-19-2023 Right hip CT showed mild OA, severe right-sided sacroiliac OA  05-19-2023 Right LE U/S negative for DVT 05-21-2023 CTPA No evidence of acute pulmonary embolism or other acute vascular findings in the chest. 2. Interval increased volume loss in the right lower lobe with associated consolidation and enlarging adjacent pleural effusion, suspicious for pneumonia. Radiographic follow-up recommended to ensure resolution. 3. Scattered small pulmonary nodules bilaterally, primarily subpleural in location, nonspecific. Metastatic disease not excluded. Non-contrast chest CT at 3-6 months is recommended. 4. Mildly enlarged  right paratracheal and right hilar lymph nodes, also nonspecific and potentially reactive. Recommend attention on follow-up CT. 5.  Aortic Atherosclerosis  Antibiotic Therapy: Anti-infectives (From admission, onward)    Start     Dose/Rate Route Frequency Ordered Stop   05/21/23 1000  azithromycin  (ZITHROMAX ) tablet 500 mg        500 mg Oral Daily 05/20/23 1121     05/20/23 1000  cefTRIAXone  (ROCEPHIN ) 2 g in sodium chloride  0.9 % 100 mL IVPB  Status:  Discontinued        2 g 200 mL/hr over 30 Minutes Intravenous Every 24 hours 05/19/23 0858 05/19/23 0901   05/20/23 1000  azithromycin  (ZITHROMAX ) 500 mg in sodium chloride  0.9 % 250 mL IVPB        500 mg 250 mL/hr over 60 Minutes Intravenous Every 24 hours 05/19/23 0859 05/20/23 1758   05/19/23 1600  Ampicillin -Sulbactam (UNASYN ) 3 g in sodium chloride  0.9 % 100 mL IVPB        3 g 200 mL/hr over 30 Minutes Intravenous Every 6 hours 05/19/23 0907     05/19/23 0330  cefTRIAXone  (ROCEPHIN ) 2 g in sodium chloride  0.9 % 100 mL IVPB        2 g 200 mL/hr over 30 Minutes Intravenous  Once 05/19/23 0329 05/19/23 0546   05/19/23 0330  azithromycin  (ZITHROMAX ) 500 mg in sodium chloride  0.9 % 250 mL IVPB        500 mg 250 mL/hr over 60 Minutes Intravenous  Once 05/19/23 0329 05/19/23 4098       Procedures:   Consultants:

## 2023-05-23 NOTE — Assessment & Plan Note (Addendum)
 05-23-2023 procal 0.19. WBC is 3.4. blood cx still no growth after 4 days. Change to po augmentin  875 mg bid. Has completed 5 days of IV/po zithromax  500 mg daily.  *update. Pt will go home with 5 days of po augmentin  875 mg bid x 5 days. This will complete 10 days of po abx. He may need repeat CXR VS CT chest in 4-6 weeks to document resolution of his pneumonia.

## 2023-05-24 ENCOUNTER — Telehealth: Payer: Self-pay

## 2023-05-24 ENCOUNTER — Encounter: Payer: Self-pay | Admitting: *Deleted

## 2023-05-24 ENCOUNTER — Telehealth: Payer: Self-pay | Admitting: Internal Medicine

## 2023-05-24 LAB — CULTURE, BLOOD (ROUTINE X 2)
Culture: NO GROWTH
Culture: NO GROWTH

## 2023-05-24 NOTE — Telephone Encounter (Signed)
Copied from CRM [id number]. Topic: General - Call Back - No Documentation >> May 24, 2023  3:23 PM Samuel Jester B wrote: Reason for CRM: Laya called from [facility] and stated that she is calling to confirm that the pcp will file and confirm on the pt home health request. Pt was diagnosed with luber pneumonia and specified organism. Callback number is [phone]

## 2023-05-24 NOTE — Transitions of Care (Post Inpatient/ED Visit) (Signed)
05/24/2023  Name: Aaron Sexton MRN: [id number] DOB: November 10, 1945  Today's TOC FU Call Status: Today's TOC FU Call Status:: Successful TOC FU Call Completed TOC FU Call Complete Date: 05/24/23 Patient's Name and Date of Birth confirmed.  Transition Care Management Follow-up Telephone Call Date of Discharge: 05/23/23 Discharge Facility: [facility]) Type of Discharge: Inpatient Admission Primary Inpatient Discharge Diagnosis:: Pneumonia How have you been since you were released from the hospital?: Better Any questions or concerns?: No  Items Reviewed: Did you receive and understand the discharge instructions provided?: Yes Medications obtained,verified, and reconciled?: Yes (Medications Reviewed) Any new allergies since your discharge?: No Dietary orders reviewed?: Yes Type of Diet Ordered:: Heart Healthy Do you have support at home?: Yes People in Home: spouse Name of Support/Comfort Primary Source: Mariella Saa  Medications Reviewed Today: Medications Reviewed Today     Reviewed by Redge Gainer, RN (Case Manager) on 05/24/23 at 1002  Med List Status: <None>   Medication Order Taking? Sig Documenting Provider Last Dose Status Informant  amoxicillin-clavulanate (AUGMENTIN) 875-125 MG tablet [id number]  Take 1 tablet by mouth every 12 (twelve) hours for 5 days. Carollee Herter, DO  Active   aspirin 81 MG tablet [id number] No Take 81 mg by mouth every evening. [provider] 05/18/2023 Evening Active Spouse/Significant Other  chlorthalidone (HYGROTON) 25 MG tablet [id number] No TAKE 1/2 TABLET(12.5 MG) BY MOUTH DAILY Antoine Poche, MD 05/18/2023 Active Spouse/Significant Other  clopidogrel (PLAVIX) 75 MG tablet [id number] No TAKE 1 TABLET(75 MG) BY MOUTH DAILY WITH Mardee Postin, MD 05/18/2023 Morning Active Spouse/Significant Other  Cyanocobalamin (VITAMIN B-12) 500 MCG SUBL [id number] No Place 500 mcg under the tongue daily.  [provider] 05/18/2023 Active Spouse/Significant Other  guaiFENesin (MUCINEX) 600 MG 12 hr tablet [id number]  Take 2 tablets (1,200 mg total) by mouth 2 (two) times daily for 10 days. Carollee Herter, DO  Active   ipratropium-albuterol (DUONEB) 0.5-2.5 (3) MG/3ML SOLN [id number]  Take 3 mLs by nebulization every 6 (six) hours as needed (SOB, cough, wheezing). Carollee Herter, DO  Active   lenalidomide (REVLIMID) 5 MG capsule [id number] No Take 1 capsule (5 mg total) by mouth daily. TAKE 1 CAPSULE (5MG ) BY MOUTH EVERY DAY Doreatha Massed, MD 05/18/2023 Noon Active Spouse/Significant Other  loperamide (IMODIUM A-D) 2 MG tablet [id number] No Take 2 mg by mouth 4 (four) times daily as needed for diarrhea or loose stools. [provider] Taking Active Spouse/Significant Other  losartan (COZAAR) 25 MG tablet [id number] No TAKE 1 TABLET(25 MG) BY MOUTH DAILY Karie Schwalbe, MD 05/18/2023 Active Spouse/Significant Other  metoprolol succinate (TOPROL-XL) 100 MG 24 hr tablet [id number] No TAKE 1 TABLET(100 MG) BY MOUTH DAILY WITH OR IMMEDIATELY FOLLOWING A MEAL Branch, Dorothe Pea, MD 05/18/2023 Active Spouse/Significant Other  nitroGLYCERIN (NITROSTAT) 0.4 MG SL tablet [id number] No Place 1 tablet (0.4 mg total) under the tongue every 5 (five) minutes as needed for chest pain. Runell Gess, MD Taking Active Spouse/Significant Other           Med Note Ileene Musa Dec 05, 2021 12:10 PM)    Omega-3 Fatty Acids (FISH OIL) 1000 MG CAPS [id number] No Take 1,000 mg by mouth daily. [provider] 05/18/2023 Active Spouse/Significant Other  omeprazole (PRILOSEC) 20 MG capsule [id number] No Take 20 mg by mouth every other day. [provider] 05/18/2023 Active Spouse/Significant Other  ondansetron (ZOFRAN) 4 MG tablet [id number]  Take 1 tablet (4 mg  total) by mouth every 6 (six) hours as needed for nausea. Carollee Herter, DO  Active   pravastatin (PRAVACHOL) 40 MG tablet [id number] No TAKE  1 TABLET(40 MG) BY MOUTH DAILY Karie Schwalbe, MD 05/18/2023 Active Spouse/Significant Other  traMADol (ULTRAM) 50 MG tablet [id number] No TAKE 1 TABLET BY MOUTH EVERY 8 HOURS AS NEEDED Karie Schwalbe, MD 05/18/2023 Active Spouse/Significant Other  Med List Note Remi Haggard, RPH-CPP 01/05/23 1515): Lenalidomide filled through [facility]            Home Care and Equipment/Supplies: Were Home Health Services Ordered?: Yes Name of Home Health Agency:: [facility] Has Agency set up a time to come to your home?: No EMR reviewed for Home Health Orders: Orders present/patient has not received call (refer to CM for follow-up) Any new equipment or medical supplies ordered?: No  Functional Questionnaire: Do you need assistance with bathing/showering or dressing?: No Do you need assistance with meal preparation?: No Do you need assistance with eating?: No Do you have difficulty maintaining continence: No Do you need assistance with getting out of bed/getting out of a chair/moving?: No Do you have difficulty managing or taking your medications?: No  Follow up appointments reviewed: PCP Follow-up appointment confirmed?: Yes Date of PCP follow-up appointment?: 05/30/23 Follow-up Provider: Tillman Abide Specialist [facility] Follow-up appointment confirmed?: Yes Date of Specialist follow-up appointment?: 05/28/23 Follow-Up Specialty Provider:: Dr. Ellin Saba Do you need transportation to your follow-up appointment?: No Do you understand care options if your condition(s) worsen?: Yes-patient verbalized understanding  SDOH Interventions Today    Flowsheet Row Most Recent Value  SDOH Interventions   Food Insecurity Interventions Intervention Not Indicated  Housing Interventions Intervention Not Indicated  Transportation Interventions Intervention Not Indicated  Utilities Interventions Intervention Not Indicated  Social Connections Interventions Intervention Not  Indicated      Interventions Today    Flowsheet Row Most Recent Value  Chronic Disease   Chronic disease during today's visit Hypertension (HTN)  General Interventions   General Interventions Discussed/Reviewed General Interventions Discussed, Doctor Visits  Doctor Visits Discussed/Reviewed Doctor Visits Discussed, Specialist  PCP/Specialist Visits Compliance with follow-up visit  Exercise Interventions   Exercise Discussed/Reviewed Physical Activity  Physical Activity Discussed/Reviewed Physical Activity Discussed  Education Interventions   Education Provided Provided Education  Provided Verbal Education On When to see the doctor, Medication  Nutrition Interventions   Nutrition Discussed/Reviewed Nutrition Reviewed  Pharmacy Interventions   Pharmacy Dicussed/Reviewed Medications and their functions  Safety Interventions   Safety Discussed/Reviewed Home Safety  Home Safety Contact home health agency      [facility] Outreach today for the patient who spent four days in the hospital due to Pneumonia. His BP medications were stopped in the hospital with a recommendation to not start them again until he has a PCP appointment. Scheduled appointment for January 22nd. BP today is 121/71 and his HR is 140. The spouse states that his HR has been elevated since he arrived home last night. Contacted the provider for update. The patient is to re-start his Metoprolol Tartrate 25mg . The patient states he is tired and weak. HHPT with [facility] has not contacted him yet. He also has Myelodysplastic Syndrome and follow with Oncology. The patient and spouse feel like they have a lot of appointments to keep up and do not want to add more Outreach at this time.   Deidre Ala, RN Medical illustrator [facility] [phone]

## 2023-05-24 NOTE — Telephone Encounter (Signed)
Spoke to [place] at [place]. Advised Dr Alphonsus Sias will do his home health stuff.

## 2023-05-24 NOTE — Progress Notes (Signed)
Aaron Sexton was contacted by telephone to reschedule missed appointment while hospitalized. Discharge on the date:  05/23/23.  Inpatient discharge AVS was re-reviewed with patient, along with cancer center appointments.  Verification of understanding for oncology specific follow-up was validated using the Teach Back method.    Transportation to appointments were confirmed for the patient as being self/caregiver.  Aaron Sexton's questions were addressed to their satisfaction upon completion of this post discharge follow-up call for outpatient oncology.

## 2023-05-25 ENCOUNTER — Other Ambulatory Visit: Payer: Self-pay

## 2023-05-25 DIAGNOSIS — D649 Anemia, unspecified: Secondary | ICD-10-CM

## 2023-05-25 DIAGNOSIS — D469 Myelodysplastic syndrome, unspecified: Secondary | ICD-10-CM

## 2023-05-28 ENCOUNTER — Inpatient Hospital Stay: Payer: Medicare PPO

## 2023-05-28 ENCOUNTER — Inpatient Hospital Stay: Payer: Medicare PPO | Admitting: Dietician

## 2023-05-28 ENCOUNTER — Inpatient Hospital Stay: Payer: Medicare PPO | Admitting: Hematology

## 2023-05-28 VITALS — BP 138/64 | HR 71 | Temp 97.5°F | Resp 18 | Wt 176.8 lb

## 2023-05-28 DIAGNOSIS — D469 Myelodysplastic syndrome, unspecified: Secondary | ICD-10-CM

## 2023-05-28 DIAGNOSIS — D649 Anemia, unspecified: Secondary | ICD-10-CM

## 2023-05-28 DIAGNOSIS — E876 Hypokalemia: Secondary | ICD-10-CM | POA: Diagnosis not present

## 2023-05-28 DIAGNOSIS — R944 Abnormal results of kidney function studies: Secondary | ICD-10-CM | POA: Diagnosis not present

## 2023-05-28 DIAGNOSIS — I2510 Atherosclerotic heart disease of native coronary artery without angina pectoris: Secondary | ICD-10-CM | POA: Diagnosis not present

## 2023-05-28 DIAGNOSIS — J90 Pleural effusion, not elsewhere classified: Secondary | ICD-10-CM | POA: Diagnosis not present

## 2023-05-28 LAB — IRON AND TIBC
Iron: 56 ug/dL (ref 45–182)
Saturation Ratios: 29 % (ref 17.9–39.5)
TIBC: 191 ug/dL — ABNORMAL LOW (ref 250–450)
UIBC: 135 ug/dL

## 2023-05-28 LAB — CBC WITH DIFFERENTIAL/PLATELET
Abs Immature Granulocytes: 0.02 10*3/uL (ref 0.00–0.07)
Basophils Absolute: 0 10*3/uL (ref 0.0–0.1)
Basophils Relative: 1 %
Eosinophils Absolute: 0 10*3/uL (ref 0.0–0.5)
Eosinophils Relative: 1 %
HCT: 23.6 % — ABNORMAL LOW (ref 39.0–52.0)
Hemoglobin: 7.8 g/dL — ABNORMAL LOW (ref 13.0–17.0)
Immature Granulocytes: 1 %
Lymphocytes Relative: 29 %
Lymphs Abs: 1.2 10*3/uL (ref 0.7–4.0)
MCH: 36.4 pg — ABNORMAL HIGH (ref 26.0–34.0)
MCHC: 33.1 g/dL (ref 30.0–36.0)
MCV: 110.3 fL — ABNORMAL HIGH (ref 80.0–100.0)
Monocytes Absolute: 0.2 10*3/uL (ref 0.1–1.0)
Monocytes Relative: 6 %
Neutro Abs: 2.5 10*3/uL (ref 1.7–7.7)
Neutrophils Relative %: 62 %
Platelets: 77 10*3/uL — ABNORMAL LOW (ref 150–400)
RBC: 2.14 MIL/uL — ABNORMAL LOW (ref 4.22–5.81)
RDW: 19.3 % — ABNORMAL HIGH (ref 11.5–15.5)
WBC: 4.1 10*3/uL (ref 4.0–10.5)
nRBC: 0 % (ref 0.0–0.2)

## 2023-05-28 LAB — COMPREHENSIVE METABOLIC PANEL
ALT: 106 U/L — ABNORMAL HIGH (ref 0–44)
AST: 71 U/L — ABNORMAL HIGH (ref 15–41)
Albumin: 2.7 g/dL — ABNORMAL LOW (ref 3.5–5.0)
Alkaline Phosphatase: 168 U/L — ABNORMAL HIGH (ref 38–126)
Anion gap: 9 (ref 5–15)
BUN: 18 mg/dL (ref 8–23)
CO2: 22 mmol/L (ref 22–32)
Calcium: 8.9 mg/dL (ref 8.9–10.3)
Chloride: 100 mmol/L (ref 98–111)
Creatinine, Ser: 1.12 mg/dL (ref 0.61–1.24)
GFR, Estimated: >60 mL/min (ref >60)
Glucose, Bld: 104 mg/dL — ABNORMAL HIGH (ref 70–99)
Potassium: 3.4 mmol/L — ABNORMAL LOW (ref 3.5–5.1)
Sodium: 131 mmol/L — ABNORMAL LOW (ref 135–145)
Total Bilirubin: 0.4 mg/dL (ref 0.0–1.2)
Total Protein: 7.7 g/dL (ref 6.5–8.1)

## 2023-05-28 LAB — FERRITIN: Ferritin: 1166 ng/mL — ABNORMAL HIGH (ref 24–336)

## 2023-05-28 LAB — LACTATE DEHYDROGENASE: LDH: 171 U/L (ref 98–192)

## 2023-05-28 NOTE — Patient Instructions (Addendum)
[facility] at [facility] Discharge Instructions   You were seen and examined today by Dr. Ellin Saba.  He reviewed your lab work from today which is mostly normal/stable. Your hemoglobin is 7.8 and your platelet count is 77. Your kidney number is good and your liver enzymes are slightly elevated but stable.   Continue to hold the Revlimid pill for now.   We will see you back next week for lab work. If your numbers have improved then we will discuss restarting the Revlimid pills.   Return as scheduled.    Thank you for choosing [facility] at [facility] to provide your oncology and hematology care.  To afford each patient quality time with our provider, please arrive at least 15 minutes before your scheduled appointment time.   If you have a lab appointment with the Cancer Center please come in thru the Main Entrance and check in at the main information desk.  You need to re-schedule your appointment should you arrive 10 or more minutes late.  We strive to give you quality time with our providers, and arriving late affects you and other patients whose appointments are after yours.  Also, if you no show three or more times for appointments you may be dismissed from the clinic at the providers discretion.     Again, thank you for choosing [facility].  Our hope is that these requests will decrease the amount of time that you wait before being seen by our physicians.       _____________________________________________________________  Should you have questions after your visit to [facility], please contact our office at [phone] and follow the prompts.  Our office hours are 8:00 a.m. and 4:30 p.m. Monday - Friday.  Please note that voicemails left after 4:00 p.m. may not be returned until the following business day.  We are closed weekends and major holidays.  You do have access to a nurse 24-7, just call the main  number to the clinic [phone] and do not press any options, hold on the line and a nurse will answer the phone.    For prescription refill requests, have your pharmacy contact our office and allow 72 hours.    Due to Covid, you will need to wear a mask upon entering the hospital. If you do not have a mask, a mask will be given to you at the Main Entrance upon arrival. For doctor visits, patients may have 1 support person age 40 or older with them. For treatment visits, patients can not have anyone with them due to social distancing guidelines and our immunocompromised population.

## 2023-05-28 NOTE — Progress Notes (Signed)
[facility] [address] [place] [address]    Clinic Day:  05/28/2023  Referring physician: Karie Schwalbe, MD  Patient Care Team: Karie Schwalbe, MD as PCP - General (Internal Medicine) Wyline Mood Dorothe Pea, MD as PCP - Cardiology (Cardiology) Aaron Massed, MD as Medical Oncologist (Hematology)   ASSESSMENT & PLAN:   Assessment: 1.  Hereditary hemochromatosis: -Patient seen for elevated ferritin levels. -EMR evaluation shows hemochromatosis testing on 01/27/2019 which showed heterozygosity for C282Y and H63D variants. -Given the elevated ferritin levels above 1000 and the results of genetic testing, this is compatible with hereditary hemochromatosis. -Patient does have arthritis of the small joints of the hands and arthritis of the back. -No history of CHF but has CAD.  No diabetes. -MRI of the liver on 12/11/2019 shows estimated liver iron concentration approximately 7.7 mg/g compatible with moderate liver iron.  Estimated fat fraction ranges from 7-12%, more pronounced in the right lobe compared to left hepatic lobe.  No focal hepatic lesions seen.  No morphological changes of significant liver disease. -Jadenu 540 mg daily started on 12/24/2019, dose increased to 720 mg daily on 02/09/2020.  - MRI liver (09/21/2021): Diminished liver iron concentration less than 2 mg/g, slightly below "mild" designation (previously well into the "moderate" range). - Dose decreased to 2 tablets daily on 07/04/2022   2.  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 Myrtie Neither on 02/17/2019 shows diverticulosis in the left colon, 12 mm polyp in the cecum, 4 to 6 mm polyp in the ascending colon and 5 mm polyp in the descending colon.  Pathology was consistent with tubular adenomas. - Colonoscopy (11/30/2022): 8 mm polyp in the transverse colon.  Multiple diverticula found in the left colon.  Internal hemorrhoids were found.  Exam was otherwise normal. - EGD  (11/30/2022): Normal esophagus.  Multiple erosions with no bleeding and no stigmata of recent bleeding in the prepyloric region of the stomach. - Serum EPO level 54.4 - NGS: SF3B1 (variant frequency 31%) - Bone marrow biopsy: Hypercellular marrow with dyspoietic changes primarily involving erythroid series associated with ring sideroblasts.  No increase in blasts.  Findings consistent with low-grade MDS with ring sideroblasts (More than 15% of erythroid precursors) - Karyotype: 46, XY,del(20)(q11.2q13.3)[3]/46,XY[17] - MDS FISH: Deletion 5 q. and deletion 20 q. - IPSS-R: Score 2.5 points, low risk MDS - IPSS-M: Score 0.83.  Leukemia free survival: 5.9 years, 1.7% by 1 year and 5.1% by 4 years AML transformation. 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 - Revlimid 10 mg daily started on 01/05/2023, held on 02/12/2023 due to thrombocytopenia and leukopenia.  Revlimid 5 mg daily started on 02/22/2023.    Plan: 1.  Hereditary hemochromatosis: - Aaron Sexton was held since 09/13/2022 due to severe anemia. - Ferritin today is 1166, up from 279 on 05/08/2023 despite not having blood transfusion.  This is most likely acute phase reactant from recent pneumonia.  No intervention necessary.  Will follow-up on it.   2.  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 - He was hospitalized from 05/18/2023 through 05/23/2023 with pneumonia.  At that time Revlimid was held.  His hemoglobin dropped to as low as 7.2 while in the hospital.  He did not receive any transfusion. - I have reviewed hospitalization records. - I have also reviewed CT angiogram of the chest from 05/21/2023:  No evidence of PE.  Increased volume loss in the right lower lobe with consolidation and enlarging pleural effusion suspicious for pneumonia.  Scattered small pulmonary nodules bilaterally nonspecific.  Mildly enlarged  right paratracheal and right hilar lymph nodes, nonspecific. - He is completing Augmentin today. - He is very weak.  He is planning to start physical therapy tomorrow at home. - Reviewed labs today: Hemoglobin 7.8 and platelet count improved to 77.  White count is normal at 4.1.  LFTs which were elevated during hospitalization are trending down.  AST 71 and ALT is 106.  Creatinine has normalized at 1.12. - Recommend continuing to hold Revlimid at this time.  I will reevaluate him in 2 weeks and if his physical condition improves, will restart back at 5 mg dose.   3.  Elevated creatinine and hypokalemia: - Creatinine is normal today at 1.12.  Potassium is also better at 3.4.    No orders of the defined types were placed in this encounter.     [name],acting as a Neurosurgeon for Aaron Massed, MD.,have documented all relevant documentation on the behalf of Aaron Massed, MD,as directed by  Aaron Massed, MD while in the presence of Aaron Massed, MD.  I, Aaron Massed MD, have reviewed the above documentation for accuracy and completeness, and I agree with the above.    Aaron Massed, MD   1/20/20254:06 PM  CHIEF COMPLAINT:   Diagnosis: hereditary hemochromatosis and low risk MDS    Cancer Staging  No matching staging information was found for the patient.    Prior Therapy: Jadenu, 12/24/19 - 09/13/22, held due to severe anemia   Current Therapy:  Revlimid 5 mg daily    HISTORY OF PRESENT ILLNESS:   Oncology History   No history exists.     INTERVAL HISTORY:   Aaron Sexton is a 78 y.o. male presenting to clinic today for follow up of MDS and hemochromatosis. He was last seen by me on 04/25/23.  Since his last visit, he was admitted to the hospital from 05/21/23 to 05/23/23 for community acquired pneumonia and was treated with IV and po Zithromax and discharged with po Augmentin 875 mg BID. While hospitalized he was found to hyponatremia due  to chlorthalidone, which had been stopped during his stay.    Today, he states that he is doing well overall. His appetite level is at 25%. His energy level is at 10%.  PAST MEDICAL HISTORY:   Past Medical History: 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   Cerebrovascular disease    Moderate ASVD without focal stenosis in 01/2007   Clotting disorder (HCC)    Colon polyps 07/2010   single 2mm polyp--tubular adenoma   Diverticulosis 2012   found on colonoscopy   Erectile dysfunction    Family history of adverse reaction to anesthesia    difficult for son & sistor to wake    GERD (gastroesophageal reflux disease)    Hematochezia 05/24/2010   Hyperlipidemia    Hypertension    Osteoarthritis    knees/hands-Dr Lucey   Overweight(278.02)    Peripheral vascular disease (HCC) 03/11/2010   Moderate SFA stenosis; history of claudication   Pneumonia    Rosacea    Vitamin B12 deficiency     Surgical History: Past Surgical History:  Procedure Laterality Date   APPENDECTOMY     APPLICATION OF WOUND VAC  Left 12/17/2021   Procedure: APPLICATION OF PREVENA WOUND VAC LEFT GROIN;  Surgeon: Victorino Sparrow, MD;  Location: [facility] OR;  Service: Vascular;  Laterality: Left;   APPLICATION OF WOUND VAC Left 01/08/2022   Procedure: APPLICATION OF WOUND VAC left groin.;  Surgeon: Cephus Shelling, MD;  Location: [facility] OR;  Service: Vascular;  Laterality: Left;   COLONOSCOPY W/ POLYPECTOMY  2012   ENDARTERECTOMY FEMORAL Left 12/09/2021   Procedure: LEFT COMMON FEMORAL ENDARTERECTOMY WITH 1 CM X 6 CM XENOSURE BOVINE PATCH ANGIOPLASTY;  Surgeon: Cephus Shelling, MD;  Location: [facility] OR;  Service: Vascular;  Laterality: Left;   FEMORAL-POPLITEAL BYPASS GRAFT Left 12/09/2021   Procedure: LEFT FEMORAL- ABOVE KNEE POPLITEAL BYPASS WITH 6 mm x 80 cm PROPATEN GRAFT;  Surgeon: Cephus Shelling, MD;   Location: [facility] OR;  Service: Vascular;  Laterality: Left;  INSERT ARTERIAL LINE   ILIAC VEIN ANGIOPLASTY / STENTING Right 08/09/2015   INCISION AND DRAINAGE OF WOUND Left 01/08/2022   Procedure: IRRIGATION AND DEBRIDEMENT GROIN LEFT application of Myriad Morcells.;  Surgeon: Cephus Shelling, MD;  Location: [facility] OR;  Service: Vascular;  Laterality: Left;   INSERTION OF ILIAC STENT  12/09/2021   Procedure: INSERTION OF LEFT COMMON AND EXTERNAL  ILIAC STENTS WITH ANGIOPLASTY;  Surgeon: Cephus Shelling, MD;  Location: [facility] OR;  Service: Vascular;;   LOWER EXTREMITY ANGIOGRAM Left 12/09/2021   Procedure: LEFT ILIAC AND LOWER EXTREMITY ANGIOGRAM;  Surgeon: Cephus Shelling, MD;  Location: [facility] OR;  Service: Vascular;  Laterality: Left;   LOWER EXTREMITY ANGIOGRAPHY Bilateral 11/03/2021   Procedure: Lower Extremity Angiography;  Surgeon: Runell Gess, MD;  Location: [facility] INVASIVE CV LAB;  Service: Cardiovascular;  Laterality: Bilateral;  Limited Study   PERIPHERAL VASCULAR CATHETERIZATION Bilateral 06/21/2015   Procedure: Lower Extremity Angiography;  Surgeon: Runell Gess, MD;  Location: [facility] INVASIVE CV LAB;  Service: Cardiovascular;  Laterality: Bilateral;   PERIPHERAL VASCULAR CATHETERIZATION N/A 06/21/2015   Procedure: Abdominal Aortogram;  Surgeon: Runell Gess, MD;  Location: [facility] INVASIVE CV LAB;  Service: Cardiovascular;  Laterality: N/A;   PERIPHERAL VASCULAR CATHETERIZATION N/A 07/26/2015   Procedure: Lower Extremity Angiography;  Surgeon: Runell Gess, MD;  Location: [facility] INVASIVE CV LAB;  Service: Cardiovascular;  Laterality: N/A;   PERIPHERAL VASCULAR CATHETERIZATION N/A 08/09/2015   Procedure: Lower Extremity Angiography;  Surgeon: Runell Gess, MD;  Location: [facility] INVASIVE CV LAB;  Service: Cardiovascular;  Laterality: N/A;   PERIPHERAL VASCULAR CATHETERIZATION  08/09/2015   Procedure: Peripheral Vascular Intervention;  Surgeon: Runell Gess, MD;  Location: [facility] INVASIVE CV LAB;   Service: Cardiovascular;;  rt ext. iliac atherectomy and stent   PERIPHERAL VASCULAR CATHETERIZATION N/A 09/20/2015   Procedure: Lower Extremity Angiography;  Surgeon: Runell Gess, MD;  Location: [facility] INVASIVE CV LAB;  Service: Cardiovascular;  Laterality: N/A;   PERIPHERAL VASCULAR CATHETERIZATION Right 09/20/2015   Procedure: Peripheral Vascular Intervention;  Surgeon: Runell Gess, MD;  Location: [facility] INVASIVE CV LAB;  Service: Cardiovascular;  Laterality: Right;  SFA   ROTATOR CUFF REPAIR Left 7/15   Dr Sherlean Foot   WOUND EXPLORATION Left 12/17/2021   Procedure: LEFT LEG WOUND EXPLORATION AND WASH OUT WITH MYRIAD MORCELLS;  Surgeon: Victorino Sparrow, MD;  Location: [facility] OR;  Service: Vascular;  Laterality: Left;    Social History: Social History   Socioeconomic History   Marital status: Married    Spouse name: Not on file   Number of children: 3  Years of education: Not on file   Highest education level: Not on file  Occupational History   Occupation: Managed supply chain--- retired 2013    Comment: [employer]  Tobacco Use   Smoking status: Former    Current packs/day: 0.00    Average packs/day: 1 pack/day for 30.0 years (30.0 ttl pk-yrs)    Types: Cigarettes    Start date: 05/16/1960    Quit date: 05/16/1990    Years since quitting: 33.0    Passive exposure: Never   Smokeless tobacco: Never   Tobacco comments:    Quit in 1993  Vaping Use   Vaping status: Never Used  Substance and Sexual Activity   Alcohol use: No    Alcohol/week: 0.0 standard drinks of alcohol   Drug use: No   Sexual activity: Not on file  Other Topics Concern   Not on file  Social History Narrative   Has living will   Wife is health care POA---alternate is son or daughter   Would accept resuscitation but no prolonged artificial ventilation.   No tube feeds if cognitively unaware   Social Drivers of Health   Financial Resource Strain: Low Risk  (03/10/2020)   Overall Financial  Resource Strain (CARDIA)    Difficulty of Paying Living Expenses: Not hard at all  Food Insecurity: No Food Insecurity (05/24/2023)   Hunger Vital Sign    Worried About Running Out of Food in the Last Year: Never true    Ran Out of Food in the Last Year: Never true  Transportation Needs: No Transportation Needs (05/24/2023)   PRAPARE - Administrator, Civil Service (Medical): No    Lack of Transportation (Non-Medical): No  Physical Activity: Inactive (03/10/2020)   Exercise Vital Sign    Days of Exercise per Week: 0 days    Minutes of Exercise per Session: 0 min  Stress: No Stress Concern Present (03/10/2020)   [employer] of Occupational Health - Occupational Stress Questionnaire    Feeling of Stress : Not at all  Social Connections: Socially Integrated (05/24/2023)   Social Connection and Isolation Panel [NHANES]    Frequency of Communication with Friends and Family: More than three times a week    Frequency of Social Gatherings with Friends and Family: More than three times a week    Attends Religious Services: More than 4 times per year    Active Member of [employer] or Organizations: Yes    Attends Engineer, structural: More than 4 times per year    Marital Status: Married  Catering manager Violence: Not At Risk (05/24/2023)   Humiliation, Afraid, Rape, and Kick questionnaire    Fear of Current or Ex-Partner: No    Emotionally Abused: No    Physically Abused: No    Sexually Abused: No    Family History: Family History  Problem Relation Age of Onset   Lung cancer Mother    Diabetes Mother    Hypertension Father        And siblings   Heart disease Father        And second-degree relatives   Transient ischemic attack Father    Brain cancer Sister    Heart disease Sister    Heart disease Sister    Thyroid disease Sister    Diabetes Brother    Atrial fibrillation Brother    Ulcers Son    Diabetes Son    Diabetes Son    Colon cancer Neg Hx  Stomach  cancer Neg Hx    Rectal cancer Neg Hx     Current Medications:  Current Outpatient Medications:    aspirin 81 MG tablet, Take 81 mg by mouth every evening., Disp: , Rfl:    clopidogrel (PLAVIX) 75 MG tablet, TAKE 1 TABLET(75 MG) BY MOUTH DAILY WITH BREAKFAST, Disp: 90 tablet, Rfl: 3   Cyanocobalamin (VITAMIN B-12) 500 MCG SUBL, Place 500 mcg under the tongue daily., Disp: , Rfl:    guaiFENesin (MUCINEX) 600 MG 12 hr tablet, Take 2 tablets (1,200 mg total) by mouth 2 (two) times daily for 10 days., Disp: , Rfl:    ipratropium-albuterol (DUONEB) 0.5-2.5 (3) MG/3ML SOLN, Take 3 mLs by nebulization every 6 (six) hours as needed (SOB, cough, wheezing)., Disp: 360 mL, Rfl: 0   loperamide (IMODIUM A-D) 2 MG tablet, Take 2 mg by mouth 4 (four) times daily as needed for diarrhea or loose stools., Disp: , Rfl:    [Paused] metoprolol succinate (TOPROL-XL) 100 MG 24 hr tablet, TAKE 1 TABLET(100 MG) BY MOUTH DAILY WITH OR IMMEDIATELY FOLLOWING A MEAL, Disp: 90 tablet, Rfl: 1   Omega-3 Fatty Acids (FISH OIL) 1000 MG CAPS, Take 1,000 mg by mouth daily., Disp: , Rfl:    omeprazole (PRILOSEC) 20 MG capsule, Take 20 mg by mouth every other day., Disp: , Rfl:    pravastatin (PRAVACHOL) 40 MG tablet, TAKE 1 TABLET(40 MG) BY MOUTH DAILY, Disp: 90 tablet, Rfl: 3   traMADol (ULTRAM) 50 MG tablet, TAKE 1 TABLET BY MOUTH EVERY 8 HOURS AS NEEDED, Disp: 90 tablet, Rfl: 0   [Paused] chlorthalidone (HYGROTON) 25 MG tablet, TAKE 1/2 TABLET(12.5 MG) BY MOUTH DAILY (Patient not taking: Reported on 05/28/2023), Disp: 30 tablet, Rfl: 3   [Paused] lenalidomide (REVLIMID) 5 MG capsule, Take 1 capsule (5 mg total) by mouth daily. TAKE 1 CAPSULE (5MG ) BY MOUTH EVERY DAY (Patient not taking: Reported on 05/28/2023), Disp: 28 capsule, Rfl: 0   [Paused] losartan (COZAAR) 25 MG tablet, TAKE 1 TABLET(25 MG) BY MOUTH DAILY (Patient not taking: Reported on 05/28/2023), Disp: 90 tablet, Rfl: 3   nitroGLYCERIN (NITROSTAT) 0.4 MG SL tablet,  Place 1 tablet (0.4 mg total) under the tongue every 5 (five) minutes as needed for chest pain. (Patient not taking: Reported on 05/28/2023), Disp: 25 tablet, Rfl: 1   ondansetron (ZOFRAN) 4 MG tablet, Take 1 tablet (4 mg total) by mouth every 6 (six) hours as needed for nausea. (Patient not taking: Reported on 05/28/2023), Disp: 30 tablet, Rfl: 0   Allergies: Allergies  Allergen Reactions   Cardizem [Diltiazem Hcl] Hives and Rash    REVIEW OF SYSTEMS:   Review of Systems  Constitutional:  Negative for chills, fatigue and fever.  HENT:   Negative for lump/mass, mouth sores, nosebleeds, sore throat and trouble swallowing.   Eyes:  Negative for eye problems.  Respiratory:  Positive for cough. Negative for shortness of breath.   Cardiovascular:  Negative for chest pain, leg swelling and palpitations.  Gastrointestinal:  Positive for diarrhea. Negative for abdominal pain, constipation, nausea and vomiting.  Genitourinary:  Negative for bladder incontinence, difficulty urinating, dysuria, frequency, hematuria and nocturia.   Musculoskeletal:  Negative for arthralgias, back pain, flank pain, myalgias and neck pain.  Skin:  Negative for itching and rash.  Neurological:  Positive for numbness. Negative for dizziness and headaches.  Hematological:  Does not bruise/bleed easily.  Psychiatric/Behavioral:  Positive for sleep disturbance. Negative for depression and suicidal ideas. The patient is not nervous/anxious.  All other systems reviewed and are negative.    VITALS:   Blood pressure 138/64, pulse 71, temperature (!) 97.5 F (36.4 C), temperature source Oral, resp. rate 18, weight 176 lb 12.8 oz (80.2 kg), SpO2 99%.  Wt Readings from Last 3 Encounters:  05/28/23 176 lb 12.8 oz (80.2 kg)  05/18/23 195 lb (88.5 kg)  04/25/23 195 lb (88.5 kg)    Body mass index is 27.69 kg/m.  Performance status (ECOG): 1 - Symptomatic but completely ambulatory  PHYSICAL EXAM:   Physical Exam Vitals  and nursing note reviewed. Exam conducted with a chaperone present.  Constitutional:      Appearance: Normal appearance.  Cardiovascular:     Rate and Rhythm: Normal rate and regular rhythm.     Pulses: Normal pulses.     Heart sounds: Normal heart sounds.  Pulmonary:     Effort: Pulmonary effort is normal.     Breath sounds: Normal breath sounds.  Abdominal:     Palpations: Abdomen is soft. There is no hepatomegaly, splenomegaly or mass.     Tenderness: There is no abdominal tenderness.  Musculoskeletal:     Right lower leg: No edema.     Left lower leg: No edema.  Lymphadenopathy:     Cervical: No cervical adenopathy.     Right cervical: No superficial, deep or posterior cervical adenopathy.    Left cervical: No superficial, deep or posterior cervical adenopathy.     Upper Body:     Right upper body: No supraclavicular or axillary adenopathy.     Left upper body: No supraclavicular or axillary adenopathy.  Neurological:     General: No focal deficit present.     Mental Status: He is alert and oriented to person, place, and time.  Psychiatric:        Mood and Affect: Mood normal.        Behavior: Behavior normal.     LABS:      Latest Ref Rng & Units 05/28/2023    1:07 PM 05/23/2023    3:43 AM 05/22/2023    5:47 AM  CBC  WBC 4.0 - 10.5 K/uL 4.1  3.4  3.1   Hemoglobin 13.0 - 17.0 g/dL 7.8  7.3  7.2   Hematocrit 39.0 - 52.0 % 23.6  21.0  21.2   Platelets 150 - 400 K/uL 77  53  59       Latest Ref Rng & Units 05/28/2023    1:07 PM 05/23/2023    3:43 AM 05/22/2023    5:47 AM  CMP  Glucose 70 - 99 mg/dL 213  086  95   BUN 8 - 23 mg/dL 18  28  22    Creatinine 0.61 - 1.24 mg/dL 5.78  4.69  6.29   Sodium 135 - 145 mmol/L 131  135  133   Potassium 3.5 - 5.1 mmol/L 3.4  3.3  3.4   Chloride 98 - 111 mmol/L 100  100  100   CO2 22 - 32 mmol/L 22  22  25    Calcium 8.9 - 10.3 mg/dL 8.9  8.1  8.2   Total Protein 6.5 - 8.1 g/dL 7.7  6.7  6.8   Total Bilirubin 0.0 - 1.2 mg/dL 0.4   0.6  0.5   Alkaline Phos 38 - 126 U/L 168  156  138   AST 15 - 41 U/L 71  206  118   ALT 0 - 44 U/L 106  160  110  No results found for: "CEA1", "CEA" / No results found for: "CEA1", "CEA" No results found for: "PSA1" No results found for: "CAN199" No results found for: "CAN125"  Lab Results  Component Value Date   TOTALPROTELP 7.2 10/04/2022   ALBUMINELP 4.0 08/16/2022   A1GS 0.2 08/16/2022   A2GS 0.7 08/16/2022   BETS 1.0 08/16/2022   GAMS 1.3 08/16/2022   MSPIKE Not Observed 08/16/2022   SPEI Comment 08/16/2022   Lab Results  Component Value Date   TIBC 191 (L) 05/28/2023   TIBC 244 (L) 05/08/2023   TIBC 252 02/13/2023   FERRITIN 1,166 (H) 05/28/2023   FERRITIN 279 05/08/2023   FERRITIN 389 (H) 02/13/2023   IRONPCTSAT 29 05/28/2023   IRONPCTSAT 32 05/08/2023   IRONPCTSAT 50 (H) 02/13/2023   Lab Results  Component Value Date   LDH 171 05/28/2023   LDH 110 04/25/2023   LDH 107 03/08/2023     STUDIES:   [place] CHEST (PLEURAL EFFUSION) Result Date: 05/22/2023 INDICATION: History of worsening shortness of breath. Found to have a small right-sided pleural effusion. Request is for therapeutic and diagnostic right-sided thoracentesis EXAM: CHEST ULTRASOUND COMPARISON:  CT angio chest dated May 21, 2023 FINDINGS: Small right-sided pleural effusion. IMPRESSION: Small left-sided pleural effusion. After discussion of the risks versus benefits of the procedure the patient elected to defer the thoracentesis at this time. Read by: Anders Grant, NP Electronically Signed   By: Judie Petit.  Shick M.D.   On: 05/22/2023 11:10   CT Angio Chest Pulmonary Embolism (PE) W or WO Contrast Result Date: 05/21/2023 CLINICAL DATA:  Worsening shortness of breath and tachypnea this morning. Pulmonary embolism suspected. Possible necrotizing pneumonia. EXAM: CT ANGIOGRAPHY CHEST WITH CONTRAST TECHNIQUE: Multidetector CT imaging of the chest was performed using the standard protocol during bolus  administration of intravenous contrast. Multiplanar CT image reconstructions and MIPs were obtained to evaluate the vascular anatomy. RADIATION DOSE REDUCTION: This exam was performed according to the departmental dose-optimization program which includes automated exposure control, adjustment of the mA and/or kV according to patient size and/or use of iterative reconstruction technique. CONTRAST:  75mL OMNIPAQUE IOHEXOL 350 MG/ML SOLN COMPARISON:  Chest radiographs and abdominal CT 05/19/2023. No prior chest CT. FINDINGS: Cardiovascular: The pulmonary arteries are well opacified with contrast to the level of the segmental branches. There is no evidence of acute pulmonary embolism. No acute systemic arterial abnormalities are identified. There is atherosclerosis of the aorta, great vessels and coronary arteries. The heart size is normal. There is no pericardial effusion. Mediastinum/Nodes: Mildly enlarged right paratracheal node measures 1.5 cm short axis on image 114/5. There are prominent right hilar lymph nodes measuring up to 1 cm in diameter. There is right pericardiac nodularity which may reflect small lymph nodes or pleural base nodules. No other enlarged mediastinal or axillary lymph nodes are seen. The thyroid gland, trachea and esophagus demonstrate no significant findings. Lungs/Pleura: Small dependent right pleural effusion has increased in volume compared with the recent abdominal CT. Interval increased volume loss in the right lower lobe with associated consolidation or ill-defined mass, slightly increased from the recent prior study. There are scattered small pulmonary nodules bilaterally which are primarily subpleural in location, including a 6 mm right middle lobe nodule on image 81/6, a 6 mm lingular nodule on image 82/6 and a 7 mm left lower lobe nodule on image 100/6, unchanged from recent abdominal CT. Additional scattered nodules are present measuring up to 6 mm anteriorly in the left upper lobe  on  image 37/6. Upper abdomen: No acute findings are seen in the visualized upper abdomen. Musculoskeletal/Chest wall: There is no chest wall mass or suspicious osseous finding. Mild multilevel spondylosis. Review of the MIP images confirms the above findings. IMPRESSION: 1. No evidence of acute pulmonary embolism or other acute vascular findings in the chest. 2. Interval increased volume loss in the right lower lobe with associated consolidation and enlarging adjacent pleural effusion, suspicious for pneumonia. Radiographic follow-up recommended to ensure resolution. 3. Scattered small pulmonary nodules bilaterally, primarily subpleural in location, nonspecific. Metastatic disease not excluded. Non-contrast chest CT at 3-6 months is recommended. If the nodules are stable at time of repeat CT, then future CT at 18-24 months (from today's scan) is considered optional for low-risk patients, but is recommended for high-risk patients. This recommendation follows the consensus statement: Guidelines for Management of Incidental Pulmonary Nodules Detected on CT Images: From the Fleischner Society 2017; Radiology 2017; 284:228-243. 4. Mildly enlarged right paratracheal and right hilar lymph nodes, also nonspecific and potentially reactive. Recommend attention on follow-up CT. 5.  Aortic Atherosclerosis (ICD10-I70.0). Electronically Signed   By: Carey Bullocks M.D.   On: 05/21/2023 11:39   CT ABDOMEN PELVIS W CONTRAST Result Date: 05/19/2023 CLINICAL DATA:  Right groin pain x2 days. EXAM: CT ABDOMEN AND PELVIS WITH CONTRAST TECHNIQUE: Multidetector CT imaging of the abdomen and pelvis was performed using the standard protocol following bolus administration of intravenous contrast. RADIATION DOSE REDUCTION: This exam was performed according to the departmental dose-optimization program which includes automated exposure control, adjustment of the mA and/or kV according to patient size and/or use of iterative reconstruction  technique. CONTRAST:  OMNIPAQUE IOHEXOL 300 MG/ML  SOLN COMPARISON:  March 03, 2004 FINDINGS: Lower chest: A 5 mm pleural based lung nodule is seen within the anterior medial aspect of the right lower lobe (axial CT image 15, CT series 4). A 5 mm lung nodule is seen within the posterolateral aspect of the left upper lobe (axial CT image 2, CT series 4). An additional 2 mm lung nodule is seen within the posterolateral aspect of the left lower lobe (axial CT image 15, CT series 4). Moderate to marked severity atelectasis and/or infiltrate is present within the posterior aspect of the right lower lobe. A 6.4 cm x 2.3 cm x 4.8 cm mass-like area of heterogeneous low attenuation is seen within the adjacent portion of the posteromedial right lung base. A very small right pleural effusion is noted. Hepatobiliary: No focal liver abnormality is seen. No gallstones, gallbladder wall thickening, or biliary dilatation. Pancreas: Unremarkable. No pancreatic ductal dilatation or surrounding inflammatory changes. Spleen: Normal in size without focal abnormality. Adrenals/Urinary Tract: Adrenal glands are unremarkable. Kidneys are normal, without renal calculi, focal lesion, or hydronephrosis. Bladder is unremarkable. Stomach/Bowel: Stomach is within normal limits. The appendix is not identified, with multiple surgical clips seen within the region posterior to the cecum. No evidence of bowel wall thickening, distention, or inflammatory changes. Small, noninflamed diverticula are seen throughout the sigmoid colon. Vascular/Lymphatic: Extensive aortic atherosclerosis with extensive calcification and atherosclerosis of the arterial structures of the pelvis and visualized lower extremities. A right common iliac artery stent is seen. Bypass graft material is seen along the left common femoral artery. No enlarged abdominal or pelvic lymph nodes. Reproductive: The prostate gland is mildly enlarged. Other: A 2.0 cm x 2.3 cm x 2.2 cm  fat containing umbilical hernia is noted. No abdominopelvic ascites. Musculoskeletal: Marked severity multilevel degenerative changes seen throughout the lumbar spine.  IMPRESSION: 1. Moderate to marked severity right lower lobe atelectasis and/or infiltrate with a mass-like area of heterogeneous low attenuation within the adjacent portion of the posteromedial right lung base. While this may represent a focal area of necrotizing pneumonia, correlation with dedicated chest CT and follow-up to resolution is recommended to exclude the presence of an underlying neoplastic process. 2. Very small right pleural effusion. 3. Multiple bilateral lung nodules, as described above. Dedicated nonemergent chest CT is again recommended. 4. Sigmoid diverticulosis. 5. Small fat containing umbilical hernia. 6. 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 7. Aortic atherosclerosis. Aortic Atherosclerosis (ICD10-I70.0). Electronically Signed   By: Aram Candela M.D.   On: 05/19/2023 03:25   CT Hip Right Wo Contrast Result Date: 05/19/2023 CLINICAL DATA:  Right groin pain. EXAM: CT OF THE RIGHT HIP WITHOUT CONTRAST TECHNIQUE: Multidetector CT imaging of the right hip was performed according to the standard protocol. Multiplanar CT image reconstructions were also generated. RADIATION DOSE REDUCTION: This exam was performed according to the departmental dose-optimization program which includes automated exposure control, adjustment of the mA and/or kV according to patient size and/or use of iterative reconstruction technique. COMPARISON:  None Available. FINDINGS: Bones/Joint/Cartilage There is no evidence of an acute fracture or dislocation. Mild degenerative changes are seen involving the right hip in the form of joint space narrowing, acetabular sclerosis and lateral acetabular bony spurring. Moderate severity degenerative changes are also seen along the visualized portion of the right sacroiliac  joint. Ligaments Suboptimally assessed by CT. Muscles and Tendons Unremarkable. Soft tissues There is marked severity vascular calcification. Mild to moderate severity prostatomegaly is also seen. No hernia is identified. IMPRESSION: 1. No acute osseous abnormality. 2. Mild degenerative changes involving the right hip. 3. Moderate severity degenerative changes along the visualized portion of the right sacroiliac joint. 4. Mild to moderate severity prostatomegaly. Electronically Signed   By: Aram Candela M.D.   On: 05/19/2023 02:47   US Venous Img Lower Unilateral Right Result Date: 05/19/2023 CLINICAL DATA:  cough, right leg pain EXAM: Right LOWER EXTREMITY VENOUS DOPPLER ULTRASOUND TECHNIQUE: Gray-scale sonography with compression, as well as color and duplex ultrasound, were performed to evaluate the deep venous system(s) from the level of the common femoral vein through the popliteal and proximal calf veins. COMPARISON:  None Available. FINDINGS: VENOUS Normal compressibility of the common femoral, superficial femoral, and popliteal veins, as well as the visualized calf veins. Visualized portions of profunda femoral vein and great saphenous vein unremarkable. No filling defects to suggest DVT on grayscale or color Doppler imaging. Doppler waveforms show normal direction of venous flow, normal respiratory plasticity and response to augmentation. Limited views of the contralateral common femoral vein are unremarkable. OTHER None. Limitations: none IMPRESSION: Negative. Electronically Signed   By: Tish Frederickson M.D.   On: 05/19/2023 00:41   DG Chest 2 View Result Date: 05/19/2023 CLINICAL DATA:  cough, right leg pain EXAM: CHEST - 2 VIEW COMPARISON:  Chest x-ray 12/09/2021 FINDINGS: The heart and mediastinal contours are grossly unchanged given low lung volumes. Low lung volumes. No focal consolidation. No pulmonary edema. No pleural effusion. No pneumothorax. No acute osseous abnormality. IMPRESSION:  Low lung volumes with no active cardiopulmonary disease. Electronically Signed   By: Tish Frederickson M.D.   On: 05/19/2023 00:37

## 2023-05-28 NOTE — Progress Notes (Signed)
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 He is currently receiving Revlimid 5 mg  1/10-1/15 [facility] admission with CAP  Met with patient and wife in clinic. Patient in wheel chair, reports increased weakness since hospital discharge. Patient unable to get comfortable and ready to go. Per wife, patient has poor appetite at home over the last month. He endorses diminished taste of foods. Currently eating 2 small meals. For breakfast, pt will have bowl of cereal or egg/cheese/sausage croissant. He will eat a mandarin orange at lunch. Patient had a small baked potato with margarine, salt/pepper for dinner. He does not like Ensure or Boost. Patient denies nausea, vomiting, diarrhea, constipation.     Nutrition Focused Physical Exam: deferred     Medications: B12, losartan, prilosec, zofran, pravastatin, tramadol   Labs: Na 131, K 3.4, albumin 2.7, Hgb 7.8   Anthropometrics:   Height: 5'7" Weight: 176 lb 12.8 oz  UBW: 205 BMI: 27.69    NUTRITION DIAGNOSIS: Unintended wt loss related to acute/chronic illness as evidenced by 14% decrease from usual weight in the last 4 months - this is severe for time frame    INTERVENTION:  Educated on smaller more frequent meals/snacks vs encouraging 2 larger meals - high cal/protein snack ideas  Discussed strategies for adding calories and protein to foods Encouraged soft moist textures for ease of intake - soft moist high protein foods list Pt likes strawberry flavored foods - he is agreeable to try strawberry Ensure Complete - samples of Ensure Complete + Ensure Clear provided  Contact information provided   MONITORING, EVALUATION, GOAL: Patient will tolerate increased calories and protein to minimize further wt loss    Next Visit: Thursday February 6 via telephone

## 2023-05-29 DIAGNOSIS — J850 Gangrene and necrosis of lung: Secondary | ICD-10-CM | POA: Diagnosis not present

## 2023-05-29 DIAGNOSIS — D631 Anemia in chronic kidney disease: Secondary | ICD-10-CM | POA: Diagnosis not present

## 2023-05-29 DIAGNOSIS — M19042 Primary osteoarthritis, left hand: Secondary | ICD-10-CM | POA: Diagnosis not present

## 2023-05-29 DIAGNOSIS — M1611 Unilateral primary osteoarthritis, right hip: Secondary | ICD-10-CM | POA: Diagnosis not present

## 2023-05-29 DIAGNOSIS — M19041 Primary osteoarthritis, right hand: Secondary | ICD-10-CM | POA: Diagnosis not present

## 2023-05-29 DIAGNOSIS — I129 Hypertensive chronic kidney disease with stage 1 through stage 4 chronic kidney disease, or unspecified chronic kidney disease: Secondary | ICD-10-CM | POA: Diagnosis not present

## 2023-05-29 DIAGNOSIS — I2510 Atherosclerotic heart disease of native coronary artery without angina pectoris: Secondary | ICD-10-CM | POA: Diagnosis not present

## 2023-05-29 DIAGNOSIS — N1831 Chronic kidney disease, stage 3a: Secondary | ICD-10-CM | POA: Diagnosis not present

## 2023-05-29 DIAGNOSIS — M170 Bilateral primary osteoarthritis of knee: Secondary | ICD-10-CM | POA: Diagnosis not present

## 2023-05-30 ENCOUNTER — Inpatient Hospital Stay: Payer: Medicare PPO | Admitting: Internal Medicine

## 2023-06-04 ENCOUNTER — Other Ambulatory Visit: Payer: Self-pay | Admitting: Hematology

## 2023-06-04 DIAGNOSIS — D469 Myelodysplastic syndrome, unspecified: Secondary | ICD-10-CM

## 2023-06-07 ENCOUNTER — Encounter: Payer: Self-pay | Admitting: Internal Medicine

## 2023-06-07 ENCOUNTER — Ambulatory Visit: Payer: Medicare PPO | Admitting: Internal Medicine

## 2023-06-07 VITALS — BP 130/84 | HR 87 | Temp 99.3°F | Ht 67.0 in

## 2023-06-07 DIAGNOSIS — I10 Essential (primary) hypertension: Secondary | ICD-10-CM

## 2023-06-07 DIAGNOSIS — D469 Myelodysplastic syndrome, unspecified: Secondary | ICD-10-CM

## 2023-06-07 DIAGNOSIS — J181 Lobar pneumonia, unspecified organism: Secondary | ICD-10-CM

## 2023-06-07 NOTE — Assessment & Plan Note (Signed)
Revlimid on hold Going back to Dr Kirtland Bouchard next week Had pancytopenia while in hospital

## 2023-06-07 NOTE — Assessment & Plan Note (Signed)
Will stop chlorthalidone as he was hyponatremic and probably doesn't need it now Restarted the metoprolol 100mg  daily already due to tachycardia Will also restart losartan

## 2023-06-07 NOTE — Assessment & Plan Note (Signed)
Likely some immune compromise Just seen on CT in [place] Done with antibiotics Still weak and not eating Discussed boost/ensure Will need repeat CT in 3-7 months for follow up

## 2023-06-07 NOTE — Progress Notes (Signed)
Subjective:    Patient ID: Aaron Sexton, male    DOB: December 27, 1945, 78 y.o.   MRN: [id number]  HPI Here with wife and son for hospital follow up  Admitted 1/10 with cough and worsening hip pain--[facility] (wouldn't take him at [facility]) Then got generalized weakness and hip pain worsened Diagnosed with [place] pneumonia (incidental finding on abd/pelvis CT)---confirmed with chest CTA (no pulm embolus but nodules and probably reactive nodes) Treated with ceftriaxone/azithromycin/unasyn  BP meds were held--chlorthalidone due to hyponatremia, metoprolol and losartan due to poor intake and low BP Sent home 1/15 with 5 days of augmentin  Still very weak Appetite is poor Hard moving around Did start with home PT last week Larey Seat this morning getting off the commode---leaned forward and lost balance  Cough is improving Mucinex helps No SOB--just weak Needs help with dressing and bathing still  Current Outpatient Medications on File Prior to Visit  Medication Sig Dispense Refill   aspirin 81 MG tablet Take 81 mg by mouth every evening.     clopidogrel (PLAVIX) 75 MG tablet TAKE 1 TABLET(75 MG) BY MOUTH DAILY WITH BREAKFAST 90 tablet 3   Cyanocobalamin (VITAMIN B-12) 500 MCG SUBL Place 500 mcg under the tongue daily.     loperamide (IMODIUM A-D) 2 MG tablet Take 2 mg by mouth 4 (four) times daily as needed for diarrhea or loose stools.     [Paused] metoprolol succinate (TOPROL-XL) 100 MG 24 hr tablet TAKE 1 TABLET(100 MG) BY MOUTH DAILY WITH OR IMMEDIATELY FOLLOWING A MEAL 90 tablet 1   nitroGLYCERIN (NITROSTAT) 0.4 MG SL tablet Place 1 tablet (0.4 mg total) under the tongue every 5 (five) minutes as needed for chest pain. 25 tablet 1   Omega-3 Fatty Acids (FISH OIL) 1000 MG CAPS Take 1,000 mg by mouth daily.     omeprazole (PRILOSEC) 20 MG capsule Take 20 mg by mouth every other day.     pravastatin (PRAVACHOL) 40 MG tablet TAKE 1 TABLET(40 MG) BY MOUTH DAILY 90 tablet 3   traMADol (ULTRAM)  50 MG tablet TAKE 1 TABLET BY MOUTH EVERY 8 HOURS AS NEEDED 90 tablet 0   [Paused] chlorthalidone (HYGROTON) 25 MG tablet TAKE 1/2 TABLET(12.5 MG) BY MOUTH DAILY (Patient not taking: No sig reported) 30 tablet 3   [Paused] lenalidomide (REVLIMID) 5 MG capsule Take 1 capsule (5 mg total) by mouth daily. TAKE 1 CAPSULE (5MG ) BY MOUTH EVERY DAY (Patient not taking: Reported on 05/28/2023) 28 capsule 0   [Paused] losartan (COZAAR) 25 MG tablet TAKE 1 TABLET(25 MG) BY MOUTH DAILY (Patient not taking: No sig reported) 90 tablet 3   No current facility-administered medications on file prior to visit.    Allergies  Allergen Reactions   Cardizem [Diltiazem Hcl] Hives and Rash    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   Cerebrovascular disease    Moderate ASVD without focal stenosis in 01/2007   Clotting disorder (HCC)    Colon polyps 07/2010   single 2mm polyp--tubular adenoma   Diverticulosis 2012   found on colonoscopy   Erectile dysfunction    Family history of adverse reaction to anesthesia    difficult for son & sistor to wake    GERD (gastroesophageal reflux disease)    Hematochezia 05/24/2010   Hyperlipidemia    Hypertension    Osteoarthritis    knees/hands-Dr Sherlean Foot  Overweight(278.02)    Peripheral vascular disease (HCC) 03/11/2010   Moderate SFA stenosis; history of claudication   Pneumonia    Rosacea    Vitamin B12 deficiency     Past Surgical History:  Procedure Laterality Date   APPENDECTOMY     APPLICATION OF WOUND VAC Left 12/17/2021   Procedure: APPLICATION OF PREVENA WOUND VAC LEFT GROIN;  Surgeon: Victorino Sparrow, MD;  Location: [facility] OR;  Service: Vascular;  Laterality: Left;   APPLICATION OF WOUND VAC Left 01/08/2022   Procedure: APPLICATION OF WOUND VAC left groin.;  Surgeon: Cephus Shelling, MD;  Location: [facility] OR;  Service: Vascular;  Laterality:  Left;   COLONOSCOPY W/ POLYPECTOMY  2012   ENDARTERECTOMY FEMORAL Left 12/09/2021   Procedure: LEFT COMMON FEMORAL ENDARTERECTOMY WITH 1 CM X 6 CM XENOSURE BOVINE PATCH ANGIOPLASTY;  Surgeon: Cephus Shelling, MD;  Location: [facility] OR;  Service: Vascular;  Laterality: Left;   FEMORAL-POPLITEAL BYPASS GRAFT Left 12/09/2021   Procedure: LEFT FEMORAL- ABOVE KNEE POPLITEAL BYPASS WITH 6 mm x 80 cm PROPATEN GRAFT;  Surgeon: Cephus Shelling, MD;  Location: [facility] OR;  Service: Vascular;  Laterality: Left;  INSERT ARTERIAL LINE   ILIAC VEIN ANGIOPLASTY / STENTING Right 08/09/2015   INCISION AND DRAINAGE OF WOUND Left 01/08/2022   Procedure: IRRIGATION AND DEBRIDEMENT GROIN LEFT application of Myriad Morcells.;  Surgeon: Cephus Shelling, MD;  Location: [facility] OR;  Service: Vascular;  Laterality: Left;   INSERTION OF ILIAC STENT  12/09/2021   Procedure: INSERTION OF LEFT COMMON AND EXTERNAL  ILIAC STENTS WITH ANGIOPLASTY;  Surgeon: Cephus Shelling, MD;  Location: [facility] OR;  Service: Vascular;;   LOWER EXTREMITY ANGIOGRAM Left 12/09/2021   Procedure: LEFT ILIAC AND LOWER EXTREMITY ANGIOGRAM;  Surgeon: Cephus Shelling, MD;  Location: [facility] OR;  Service: Vascular;  Laterality: Left;   LOWER EXTREMITY ANGIOGRAPHY Bilateral 11/03/2021   Procedure: Lower Extremity Angiography;  Surgeon: Runell Gess, MD;  Location: [facility] INVASIVE CV LAB;  Service: Cardiovascular;  Laterality: Bilateral;  Limited Study   PERIPHERAL VASCULAR CATHETERIZATION Bilateral 06/21/2015   Procedure: Lower Extremity Angiography;  Surgeon: Runell Gess, MD;  Location: [facility] INVASIVE CV LAB;  Service: Cardiovascular;  Laterality: Bilateral;   PERIPHERAL VASCULAR CATHETERIZATION N/A 06/21/2015   Procedure: Abdominal Aortogram;  Surgeon: Runell Gess, MD;  Location: [facility] INVASIVE CV LAB;  Service: Cardiovascular;  Laterality: N/A;   PERIPHERAL VASCULAR CATHETERIZATION N/A 07/26/2015   Procedure: Lower Extremity Angiography;  Surgeon: Runell Gess, MD;  Location: [facility] INVASIVE CV LAB;  Service: Cardiovascular;  Laterality: N/A;   PERIPHERAL VASCULAR CATHETERIZATION N/A 08/09/2015   Procedure: Lower Extremity Angiography;  Surgeon: Runell Gess, MD;  Location: [facility] INVASIVE CV LAB;  Service: Cardiovascular;  Laterality: N/A;   PERIPHERAL VASCULAR CATHETERIZATION  08/09/2015   Procedure: Peripheral Vascular Intervention;  Surgeon: Runell Gess, MD;  Location: [facility] INVASIVE CV LAB;  Service: Cardiovascular;;  rt ext. iliac atherectomy and stent   PERIPHERAL VASCULAR CATHETERIZATION N/A 09/20/2015   Procedure: Lower Extremity Angiography;  Surgeon: Runell Gess, MD;  Location: [facility] INVASIVE CV LAB;  Service: Cardiovascular;  Laterality: N/A;   PERIPHERAL VASCULAR CATHETERIZATION Right 09/20/2015   Procedure: Peripheral Vascular Intervention;  Surgeon: Runell Gess, MD;  Location: [facility] INVASIVE CV LAB;  Service: Cardiovascular;  Laterality: Right;  SFA   ROTATOR CUFF REPAIR Left 7/15   Dr Sherlean Foot   WOUND EXPLORATION Left 12/17/2021   Procedure: LEFT LEG WOUND EXPLORATION AND [facility]  OUT WITH MYRIAD MORCELLS;  Surgeon: Victorino Sparrow, MD;  Location: [facility] OR;  Service: Vascular;  Laterality: Left;    Family History  Problem Relation Age of Onset   Lung cancer Mother    Diabetes Mother    Hypertension Father        And siblings   Heart disease Father        And second-degree relatives   Transient ischemic attack Father    Brain cancer Sister    Heart disease Sister    Heart disease Sister    Thyroid disease Sister    Diabetes Brother    Atrial fibrillation Brother    Ulcers Son    Diabetes Son    Diabetes Son    Colon cancer Neg Hx    Stomach cancer Neg Hx    Rectal cancer Neg Hx     Social History   Socioeconomic History   Marital status: Married    Spouse name: Not on file   Number of children: 3   Years of education: Not on file   Highest education level: Not on file  Occupational History   Occupation: Managed supply  chain--- retired 2013    Comment: [employer]  Tobacco Use   Smoking status: Former    Current packs/day: 0.00    Average packs/day: 1 pack/day for 30.0 years (30.0 ttl pk-yrs)    Types: Cigarettes    Start date: 05/16/1960    Quit date: 05/16/1990    Years since quitting: 33.0    Passive exposure: Never   Smokeless tobacco: Never   Tobacco comments:    Quit in 1993  Vaping Use   Vaping status: Never Used  Substance and Sexual Activity   Alcohol use: No    Alcohol/week: 0.0 standard drinks of alcohol   Drug use: No   Sexual activity: Not on file  Other Topics Concern   Not on file  Social History Narrative   Has living will   Wife is health care POA---alternate is son or daughter   Would accept resuscitation but no prolonged artificial ventilation.   No tube feeds if cognitively unaware   Social Drivers of Health   Financial Resource Strain: Low Risk  (03/10/2020)   Overall Financial Resource Strain (CARDIA)    Difficulty of Paying Living Expenses: Not hard at all  Food Insecurity: No Food Insecurity (05/24/2023)   Hunger Vital Sign    Worried About Running Out of Food in the Last Year: Never true    Ran Out of Food in the Last Year: Never true  Transportation Needs: No Transportation Needs (05/24/2023)   PRAPARE - Administrator, Civil Service (Medical): No    Lack of Transportation (Non-Medical): No  Physical Activity: Inactive (03/10/2020)   Exercise Vital Sign    Days of Exercise per Week: 0 days    Minutes of Exercise per Session: 0 min  Stress: No Stress Concern Present (03/10/2020)   [employer] of Occupational Health - Occupational Stress Questionnaire    Feeling of Stress : Not at all  Social Connections: Socially Integrated (05/24/2023)   Social Connection and Isolation Panel [NHANES]    Frequency of Communication with Friends and Family: More than three times a week    Frequency of Social Gatherings with Friends and Family: More  than three times a week    Attends Religious Services: More than 4 times per year    Active Member of [employer] or Organizations: Yes  Attends Banker Meetings: More than 4 times per year    Marital Status: Married  Catering manager Violence: Not At Risk (05/24/2023)   Humiliation, Afraid, Rape, and Kick questionnaire    Fear of Current or Ex-Partner: No    Emotionally Abused: No    Physically Abused: No    Sexually Abused: No   Review of Systems Sleeps okay No N/V Had tachycardia noted by nurse---restarted the metoprolol then    Objective:   Physical Exam Constitutional:      Comments: Looks mildly ill but no distress  Cardiovascular:     Rate and Rhythm: Normal rate and regular rhythm.     Heart sounds: No murmur heard.    No gallop.  Pulmonary:     Effort: Pulmonary effort is normal.     Breath sounds: Normal breath sounds. No wheezing or rales.  Abdominal:     Palpations: Abdomen is soft.     Tenderness: There is no abdominal tenderness.  Musculoskeletal:     Cervical back: Neck supple.     Right lower leg: No edema.     Left lower leg: No edema.  Lymphadenopathy:     Cervical: No cervical adenopathy.  Neurological:     Mental Status: He is alert.            Assessment & Plan:

## 2023-06-08 ENCOUNTER — Other Ambulatory Visit: Payer: Self-pay

## 2023-06-08 DIAGNOSIS — D469 Myelodysplastic syndrome, unspecified: Secondary | ICD-10-CM

## 2023-06-09 DIAGNOSIS — D631 Anemia in chronic kidney disease: Secondary | ICD-10-CM | POA: Diagnosis not present

## 2023-06-09 DIAGNOSIS — J850 Gangrene and necrosis of lung: Secondary | ICD-10-CM | POA: Diagnosis not present

## 2023-06-09 DIAGNOSIS — M170 Bilateral primary osteoarthritis of knee: Secondary | ICD-10-CM | POA: Diagnosis not present

## 2023-06-09 DIAGNOSIS — M19041 Primary osteoarthritis, right hand: Secondary | ICD-10-CM | POA: Diagnosis not present

## 2023-06-09 DIAGNOSIS — I2510 Atherosclerotic heart disease of native coronary artery without angina pectoris: Secondary | ICD-10-CM | POA: Diagnosis not present

## 2023-06-09 DIAGNOSIS — I129 Hypertensive chronic kidney disease with stage 1 through stage 4 chronic kidney disease, or unspecified chronic kidney disease: Secondary | ICD-10-CM | POA: Diagnosis not present

## 2023-06-09 DIAGNOSIS — N1831 Chronic kidney disease, stage 3a: Secondary | ICD-10-CM | POA: Diagnosis not present

## 2023-06-09 DIAGNOSIS — M19042 Primary osteoarthritis, left hand: Secondary | ICD-10-CM | POA: Diagnosis not present

## 2023-06-09 DIAGNOSIS — M1611 Unilateral primary osteoarthritis, right hip: Secondary | ICD-10-CM | POA: Diagnosis not present

## 2023-06-11 DIAGNOSIS — D631 Anemia in chronic kidney disease: Secondary | ICD-10-CM | POA: Diagnosis not present

## 2023-06-11 DIAGNOSIS — N1831 Chronic kidney disease, stage 3a: Secondary | ICD-10-CM | POA: Diagnosis not present

## 2023-06-11 DIAGNOSIS — J850 Gangrene and necrosis of lung: Secondary | ICD-10-CM | POA: Diagnosis not present

## 2023-06-11 DIAGNOSIS — M19041 Primary osteoarthritis, right hand: Secondary | ICD-10-CM | POA: Diagnosis not present

## 2023-06-11 DIAGNOSIS — M1611 Unilateral primary osteoarthritis, right hip: Secondary | ICD-10-CM | POA: Diagnosis not present

## 2023-06-11 DIAGNOSIS — I2510 Atherosclerotic heart disease of native coronary artery without angina pectoris: Secondary | ICD-10-CM | POA: Diagnosis not present

## 2023-06-11 DIAGNOSIS — M170 Bilateral primary osteoarthritis of knee: Secondary | ICD-10-CM | POA: Diagnosis not present

## 2023-06-11 DIAGNOSIS — M19042 Primary osteoarthritis, left hand: Secondary | ICD-10-CM | POA: Diagnosis not present

## 2023-06-11 DIAGNOSIS — I129 Hypertensive chronic kidney disease with stage 1 through stage 4 chronic kidney disease, or unspecified chronic kidney disease: Secondary | ICD-10-CM | POA: Diagnosis not present

## 2023-06-12 NOTE — Progress Notes (Signed)
 [facility] [address], [place] [address]    Clinic Day:  06/13/2023  Referring physician: Jimmy Charlie FERNS, MD  Patient Care Team: Jimmy Charlie FERNS, MD as PCP - General (Internal Medicine) Alvan Dorn FALCON, MD as PCP - Cardiology (Cardiology) Rogers Hai, MD as Medical Oncologist (Hematology)   ASSESSMENT & PLAN:   Assessment: 1.  Hereditary hemochromatosis: -Patient seen for elevated ferritin levels. -EMR evaluation shows hemochromatosis testing on 01/27/2019 which showed heterozygosity for C282Y and H63D variants. -Given the elevated ferritin levels above 1000 and the results of genetic testing, this is compatible with hereditary hemochromatosis. -Patient does have arthritis of the small joints of the hands and arthritis of the back. -No history of CHF but has CAD.  No diabetes. -MRI of the liver on 12/11/2019 shows estimated liver iron concentration approximately 7.7 mg/g compatible with moderate liver iron.  Estimated fat fraction ranges from 7-12%, more pronounced in the right lobe compared to left hepatic lobe.  No focal hepatic lesions seen.  No morphological changes of significant liver disease. -Jadenu  540 mg daily started on 12/24/2019, dose increased to 720 mg daily on 02/09/2020.  - MRI liver (09/21/2021): Diminished liver iron concentration less than 2 mg/g, slightly below mild designation (previously well into the moderate range). - Dose decreased to 2 tablets daily on 07/04/2022   2.  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 Legrand on 02/17/2019 shows diverticulosis in the left colon, 12 mm polyp in the cecum, 4 to 6 mm polyp in the ascending colon and 5 mm polyp in the descending colon.  Pathology was consistent with tubular adenomas. - Colonoscopy (11/30/2022): 8 mm polyp in the transverse colon.  Multiple diverticula found in the left colon.  Internal hemorrhoids were found.  Exam was otherwise normal. - EGD  (11/30/2022): Normal esophagus.  Multiple erosions with no bleeding and no stigmata of recent bleeding in the prepyloric region of the stomach. - Serum EPO level 54.4 - NGS: SF3B1 (variant frequency 31%) - Bone marrow biopsy: Hypercellular marrow with dyspoietic changes primarily involving erythroid series associated with ring sideroblasts.  No increase in blasts.  Findings consistent with low-grade MDS with ring sideroblasts (More than 15% of erythroid precursors) - Karyotype: 46, XY,del(20)(q11.2q13.3)[3]/46,XY[17] - MDS FISH: Deletion 5 q. and deletion 20 q. - IPSS-R: Score 2.5 points, low risk MDS - IPSS-M: Score 0.83.  Leukemia free survival: 5.9 years, 1.7% by 1 year and 5.1% by 4 years AML transformation. 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 deletion and transfusion dependent anemia, recommend treatment with lenalidomide  10 mg daily. - Revlimid  10 mg daily started on 01/05/2023, held on 02/12/2023 due to thrombocytopenia and leukopenia.  Revlimid  5 mg daily started on 02/22/2023.  Revlimid  held on 05/18/2023 when he was admitted to the hospital with pneumonia.  Restarted back on 06/13/2023.    Plan: 1.  Hereditary hemochromatosis: - Jadenu  held on 09/13/2022 due to severe anemia. - Ferritin today improved to 807 from 1166 on 05/28/2023.   2.  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 - He reports decreased appetite and energy levels.   - Reviewed labs today: AST and ALT improved to 44 and 46 respectively.  Creatinine normalized at 1.03.  Sodium is mildly low at 129.  Hemoglobin today 7.1 and platelet count is 89. - Recommend giving 1 unit PRBC tomorrow. - Will start back on Revlimid  5 mg daily.  RTC 2 weeks for  follow-up to repeat blood work.   3.  Decreased appetite and weight loss: - He lost 3 pounds in the last 2 weeks. - Eats good breakfast, drinks Ensure or eats ice cream at lunch and only occasionally eats dinner. - Will start him on Megace   400 mg twice daily.  4.  Bilateral lung nodules: - CT angiogram on 05/21/2023 showed scattered small nodules bilaterally, primarily subpleural in location, nonspecific.  Mildly enlarged right paratracheal and right hilar lymph nodes nonspecific. - Will repeat noncontrast CT in 3 to 6 months.    Orders Placed This Encounter  Procedures   CBC with Differential    Standing Status:   Future    Expected Date:   06/27/2023    Expiration Date:   06/12/2024   Comprehensive metabolic panel    Standing Status:   Future    Expected Date:   06/27/2023    Expiration Date:   06/12/2024   Care order/instruction    Transfuse Parameters    Standing Status:   Future    Expiration Date:   06/12/2024   Type and screen         Standing Status:   Future    Number of Occurrences:   1    Expiration Date:   06/12/2024   Prepare RBC (crossmatch)    Standing Status:   Standing    Number of Occurrences:   1    # of Units:   1 unit    Transfusion Indications:   Other    Comments:   symptomatic anemia    Number of Units to Keep Ahead:   NO units ahead    If emergent release call blood bank:   Not emergent release   Sample to Blood Bank(Blood Bank Hold)    Standing Status:   Future    Expected Date:   06/27/2023    Expiration Date:   06/12/2024      LILLETTE Verneta SAUNDERS Teague,acting as a scribe for Alean Stands, MD.,have documented all relevant documentation on the behalf of Alean Stands, MD,as directed by  Alean Stands, MD while in the presence of Alean Stands, MD.  I, Alean Stands MD, have reviewed the above documentation for accuracy and completeness, and I agree with the above.     Alean Stands, MD   2/5/20252:51 PM  CHIEF COMPLAINT:   Diagnosis: hereditary hemochromatosis and low risk MDS    Cancer Staging  No matching staging information was found for the patient.    Prior Therapy: Jadenu , 12/24/19 - 09/13/22, held due to severe anemia   Current Therapy:   Revlimid  5 mg daily    HISTORY OF PRESENT ILLNESS:   Oncology History   No history exists.     INTERVAL HISTORY:   Aaron Sexton is a 78 y.o. male presenting to clinic today for follow up of MDS and hemochromatosis. He was last seen by me on 05/28/23.  Today, he states that he is doing well overall. His appetite level is at 25%. His energy level is at 25%.  PAST MEDICAL HISTORY:   Past Medical History: 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   Cerebrovascular disease    Moderate ASVD without focal stenosis in 01/2007   Clotting disorder (HCC)    Colon polyps 07/2010   single 2mm polyp--tubular adenoma   Diverticulosis 2012   found on colonoscopy  Erectile dysfunction    Family history of adverse reaction to anesthesia    difficult for son & sistor to wake    GERD (gastroesophageal reflux disease)    Hematochezia 05/24/2010   Hyperlipidemia    Hypertension    Osteoarthritis    knees/hands-Dr Lucey   Overweight(278.02)    Peripheral vascular disease (HCC) 03/11/2010   Moderate SFA stenosis; history of claudication   Pneumonia    Rosacea    Vitamin B12 deficiency     Surgical History: Past Surgical History:  Procedure Laterality Date   APPENDECTOMY     APPLICATION OF WOUND VAC Left 12/17/2021   Procedure: APPLICATION OF PREVENA WOUND VAC LEFT GROIN;  Surgeon: Lanis Fonda BRAVO, MD;  Location[facility] OR;  Service: Vascular;  Laterality: Left;   APPLICATION OF WOUND VAC Left 01/08/2022   Procedure: APPLICATION OF WOUND VAC left groin.;  Surgeon: Gretta Lonni PARAS, MD;  Location: [facility] OR;  Service: Vascular;  Laterality: Left;   COLONOSCOPY W/ POLYPECTOMY  2012   ENDARTERECTOMY FEMORAL Left 12/09/2021   Procedure: LEFT COMMON FEMORAL ENDARTERECTOMY WITH 1 CM X 6 CM XENOSURE BOVINE PATCH ANGIOPLASTY;  Surgeon: Gretta Lonni PARAS, MD;  Location: [facility] OR;  Service: Vascular;   Laterality: Left;   FEMORAL-POPLITEAL BYPASS GRAFT Left 12/09/2021   Procedure: LEFT FEMORAL- ABOVE KNEE POPLITEAL BYPASS WITH 6 mm x 80 cm PROPATEN GRAFT;  Surgeon: Gretta Lonni PARAS, MD;  Location: [facility] OR;  Service: Vascular;  Laterality: Left;  INSERT ARTERIAL LINE   ILIAC VEIN ANGIOPLASTY / STENTING Right 08/09/2015   INCISION AND DRAINAGE OF WOUND Left 01/08/2022   Procedure: IRRIGATION AND DEBRIDEMENT GROIN LEFT application of Myriad Morcells.;  Surgeon: Gretta Lonni PARAS, MD;  Location: [facility] OR;  Service: Vascular;  Laterality: Left;   INSERTION OF ILIAC STENT  12/09/2021   Procedure: INSERTION OF LEFT COMMON AND EXTERNAL  ILIAC STENTS WITH ANGIOPLASTY;  Surgeon: Gretta Lonni PARAS, MD;  Location: [facility] OR;  Service: Vascular;;   LOWER EXTREMITY ANGIOGRAM Left 12/09/2021   Procedure: LEFT ILIAC AND LOWER EXTREMITY ANGIOGRAM;  Surgeon: Gretta Lonni PARAS, MD;  Location: [facility] OR;  Service: Vascular;  Laterality: Left;   LOWER EXTREMITY ANGIOGRAPHY Bilateral 11/03/2021   Procedure: Lower Extremity Angiography;  Surgeon: Court Dorn PARAS, MD;  Location: [facility] INVASIVE CV LAB;  Service: Cardiovascular;  Laterality: Bilateral;  Limited Study   PERIPHERAL VASCULAR CATHETERIZATION Bilateral 06/21/2015   Procedure: Lower Extremity Angiography;  Surgeon: Dorn PARAS Court, MD;  Location: [facility] INVASIVE CV LAB;  Service: Cardiovascular;  Laterality: Bilateral;   PERIPHERAL VASCULAR CATHETERIZATION N/A 06/21/2015   Procedure: Abdominal Aortogram;  Surgeon: Dorn PARAS Court, MD;  Location: [facility] INVASIVE CV LAB;  Service: Cardiovascular;  Laterality: N/A;   PERIPHERAL VASCULAR CATHETERIZATION N/A 07/26/2015   Procedure: Lower Extremity Angiography;  Surgeon: Dorn PARAS Court, MD;  Location: [facility] INVASIVE CV LAB;  Service: Cardiovascular;  Laterality: N/A;   PERIPHERAL VASCULAR CATHETERIZATION N/A 08/09/2015   Procedure: Lower Extremity Angiography;  Surgeon: Dorn PARAS Court, MD;  Location: [facility] INVASIVE CV LAB;  Service:  Cardiovascular;  Laterality: N/A;   PERIPHERAL VASCULAR CATHETERIZATION  08/09/2015   Procedure: Peripheral Vascular Intervention;  Surgeon: Dorn PARAS Court, MD;  Location: [facility] INVASIVE CV LAB;  Service: Cardiovascular;;  rt ext. iliac atherectomy and stent   PERIPHERAL VASCULAR CATHETERIZATION N/A 09/20/2015   Procedure: Lower Extremity Angiography;  Surgeon: Dorn PARAS Court, MD;  Location: [facility] INVASIVE CV LAB;  Service: Cardiovascular;  Laterality: N/A;   PERIPHERAL VASCULAR CATHETERIZATION  Right 09/20/2015   Procedure: Peripheral Vascular Intervention;  Surgeon: Dorn JINNY Lesches, MD;  Location: [facility] INVASIVE CV LAB;  Service: Cardiovascular;  Laterality: Right;  SFA   ROTATOR CUFF REPAIR Left 7/15   Dr Rubie   WOUND EXPLORATION Left 12/17/2021   Procedure: LEFT LEG WOUND EXPLORATION AND WASH OUT WITH MYRIAD MORCELLS;  Surgeon: Lanis Fonda BRAVO, MD;  Location: [facility] OR;  Service: Vascular;  Laterality: Left;    Social History: Social History   Socioeconomic History   Marital status: Married    Spouse name: Not on file   Number of children: 3   Years of education: Not on file   Highest education level: Not on file  Occupational History   Occupation: Managed supply chain--- retired 2013    Comment: [employer]  Tobacco Use   Smoking status: Former    Current packs/day: 0.00    Average packs/day: 1 pack/day for 30.0 years (30.0 ttl pk-yrs)    Types: Cigarettes    Start date: 05/16/1960    Quit date: 05/16/1990    Years since quitting: 33.0    Passive exposure: Never   Smokeless tobacco: Never   Tobacco comments:    Quit in 1993  Vaping Use   Vaping status: Never Used  Substance and Sexual Activity   Alcohol use: No    Alcohol/week: 0.0 standard drinks of alcohol   Drug use: No   Sexual activity: Not on file  Other Topics Concern   Not on file  Social History Narrative   Has living will   Wife is health care POA---alternate is son or daughter   Would accept  resuscitation but no prolonged artificial ventilation.   No tube feeds if cognitively unaware   Social Drivers of Health   Financial Resource Strain: Low Risk  (03/10/2020)   Overall Financial Resource Strain (CARDIA)    Difficulty of Paying Living Expenses: Not hard at all  Food Insecurity: No Food Insecurity (05/24/2023)   Hunger Vital Sign    Worried About Running Out of Food in the Last Year: Never true    Ran Out of Food in the Last Year: Never true  Transportation Needs: No Transportation Needs (05/24/2023)   PRAPARE - Administrator, Civil Service (Medical): No    Lack of Transportation (Non-Medical): No  Physical Activity: Inactive (03/10/2020)   Exercise Vital Sign    Days of Exercise per Week: 0 days    Minutes of Exercise per Session: 0 min  Stress: No Stress Concern Present (03/10/2020)   [employer] of Occupational Health - Occupational Stress Questionnaire    Feeling of Stress : Not at all  Social Connections: Socially Integrated (05/24/2023)   Social Connection and Isolation Panel [NHANES]    Frequency of Communication with Friends and Family: More than three times a week    Frequency of Social Gatherings with Friends and Family: More than three times a week    Attends Religious Services: More than 4 times per year    Active Member of [employer] or Organizations: Yes    Attends Banker Meetings: More than 4 times per year    Marital Status: Married  Catering Manager Violence: Not At Risk (05/24/2023)   Humiliation, Afraid, Rape, and Kick questionnaire    Fear of Current or Ex-Partner: No    Emotionally Abused: No    Physically Abused: No    Sexually Abused: No    Family History: Family History  Problem Relation  Age of Onset   Lung cancer Mother    Diabetes Mother    Hypertension Father        And siblings   Heart disease Father        And second-degree relatives   Transient ischemic attack Father    Brain cancer Sister    Heart  disease Sister    Heart disease Sister    Thyroid disease Sister    Diabetes Brother    Atrial fibrillation Brother    Ulcers Son    Diabetes Son    Diabetes Son    Colon cancer Neg Hx    Stomach cancer Neg Hx    Rectal cancer Neg Hx     Current Medications:  Current Outpatient Medications:    megestrol  (MEGACE ) 400 MG/10ML suspension, Take 10 mLs (400 mg total) by mouth 2 (two) times daily., Disp: 480 mL, Rfl: 3   aspirin  81 MG tablet, Take 81 mg by mouth every evening., Disp: , Rfl:    clopidogrel  (PLAVIX ) 75 MG tablet, TAKE 1 TABLET(75 MG) BY MOUTH DAILY WITH BREAKFAST, Disp: 90 tablet, Rfl: 3   Cyanocobalamin  (VITAMIN B-12) 500 MCG SUBL, Place 500 mcg under the tongue daily., Disp: , Rfl:    lenalidomide  (REVLIMID ) 5 MG capsule, Take 1 capsule (5 mg total) by mouth daily. TAKE 1 CAPSULE (5MG ) BY MOUTH EVERY DAY, Disp: 28 capsule, Rfl: 0   loperamide  (IMODIUM  A-D) 2 MG tablet, Take 2 mg by mouth 4 (four) times daily as needed for diarrhea or loose stools., Disp: , Rfl:    losartan  (COZAAR ) 25 MG tablet, TAKE 1 TABLET(25 MG) BY MOUTH DAILY (Patient not taking: No sig reported), Disp: 90 tablet, Rfl: 3   metoprolol  succinate (TOPROL -XL) 100 MG 24 hr tablet, TAKE 1 TABLET(100 MG) BY MOUTH DAILY WITH OR IMMEDIATELY FOLLOWING A MEAL, Disp: 90 tablet, Rfl: 1   nitroGLYCERIN  (NITROSTAT ) 0.4 MG SL tablet, Place 1 tablet (0.4 mg total) under the tongue every 5 (five) minutes as needed for chest pain., Disp: 25 tablet, Rfl: 1   Omega-3 Fatty Acids (FISH OIL) 1000 MG CAPS, Take 1,000 mg by mouth daily., Disp: , Rfl:    omeprazole (PRILOSEC) 20 MG capsule, Take 20 mg by mouth every other day., Disp: , Rfl:    pravastatin  (PRAVACHOL ) 40 MG tablet, TAKE 1 TABLET(40 MG) BY MOUTH DAILY, Disp: 90 tablet, Rfl: 3   traMADol  (ULTRAM ) 50 MG tablet, TAKE 1 TABLET BY MOUTH EVERY 8 HOURS AS NEEDED, Disp: 90 tablet, Rfl: 0   Allergies: Allergies  Allergen Reactions   Cardizem [Diltiazem Hcl] Hives and  Rash    REVIEW OF SYSTEMS:   Review of Systems  Constitutional:  Positive for fatigue. Negative for chills and fever.  HENT:   Negative for lump/mass, mouth sores, nosebleeds, sore throat and trouble swallowing.   Eyes:  Negative for eye problems.  Respiratory:  Negative for cough and shortness of breath.   Cardiovascular:  Negative for chest pain, leg swelling and palpitations.  Gastrointestinal:  Positive for diarrhea. Negative for abdominal pain, constipation, nausea and vomiting.  Genitourinary:  Negative for bladder incontinence, difficulty urinating, dysuria, frequency, hematuria and nocturia.   Musculoskeletal:  Negative for arthralgias, back pain, flank pain, myalgias and neck pain.  Skin:  Negative for itching and rash.  Neurological:  Negative for dizziness, headaches and numbness.  Hematological:  Bruises/bleeds easily.  Psychiatric/Behavioral:  Negative for depression, sleep disturbance and suicidal ideas. The patient is not nervous/anxious.   All  other systems reviewed and are negative.    VITALS:   Blood pressure (!) 149/72, pulse (!) 104, temperature 99 F (37.2 C), temperature source Tympanic, resp. rate 18, height 5' 7 (1.702 m), weight 173 lb 8 oz (78.7 kg).  Wt Readings from Last 3 Encounters:  06/13/23 173 lb 8 oz (78.7 kg)  05/28/23 176 lb 12.8 oz (80.2 kg)  05/18/23 195 lb (88.5 kg)    Body mass index is 27.17 kg/m.  Performance status (ECOG): 1 - Symptomatic but completely ambulatory  PHYSICAL EXAM:   Physical Exam Vitals and nursing note reviewed. Exam conducted with a chaperone present.  Constitutional:      Appearance: Normal appearance.  Cardiovascular:     Rate and Rhythm: Normal rate and regular rhythm.     Pulses: Normal pulses.     Heart sounds: Normal heart sounds.  Pulmonary:     Effort: Pulmonary effort is normal.     Breath sounds: Normal breath sounds.  Abdominal:     Palpations: Abdomen is soft. There is no hepatomegaly,  splenomegaly or mass.     Tenderness: There is no abdominal tenderness.  Musculoskeletal:     Right lower leg: No edema.     Left lower leg: No edema.  Lymphadenopathy:     Cervical: No cervical adenopathy.     Right cervical: No superficial, deep or posterior cervical adenopathy.    Left cervical: No superficial, deep or posterior cervical adenopathy.     Upper Body:     Right upper body: No supraclavicular or axillary adenopathy.     Left upper body: No supraclavicular or axillary adenopathy.  Neurological:     General: No focal deficit present.     Mental Status: He is alert and oriented to person, place, and time.  Psychiatric:        Mood and Affect: Mood normal.        Behavior: Behavior normal.     LABS:      Latest Ref Rng & Units 06/13/2023   12:50 PM 05/28/2023    1:07 PM 05/23/2023    3:43 AM  CBC  WBC 4.0 - 10.5 K/uL 4.5  4.1  3.4   Hemoglobin 13.0 - 17.0 g/dL 7.1  7.8  7.3   Hematocrit 39.0 - 52.0 % 21.7  23.6  21.0   Platelets 150 - 400 K/uL 89  77  53       Latest Ref Rng & Units 06/13/2023   12:50 PM 05/28/2023    1:07 PM 05/23/2023    3:43 AM  CMP  Glucose 70 - 99 mg/dL 877  895  898   BUN 8 - 23 mg/dL 17  18  28    Creatinine 0.61 - 1.24 mg/dL 8.96  8.87  8.66   Sodium 135 - 145 mmol/L 129  131  135   Potassium 3.5 - 5.1 mmol/L 3.9  3.4  3.3   Chloride 98 - 111 mmol/L 98  100  100   CO2 22 - 32 mmol/L 22  22  22    Calcium 8.9 - 10.3 mg/dL 9.4  8.9  8.1   Total Protein 6.5 - 8.1 g/dL 7.4  7.7  6.7   Total Bilirubin 0.0 - 1.2 mg/dL 0.5  0.4  0.6   Alkaline Phos 38 - 126 U/L 93  168  156   AST 15 - 41 U/L 44  71  206   ALT 0 - 44 U/L 46  106  160      No results found for: CEA1, CEA / No results found for: CEA1, CEA No results found for: PSA1 No results found for: CAN199 No results found for: CAN125  Lab Results  Component Value Date   TOTALPROTELP 7.2 10/04/2022   ALBUMINELP 4.0 08/16/2022   A1GS 0.2 08/16/2022   A2GS 0.7  08/16/2022   BETS 1.0 08/16/2022   GAMS 1.3 08/16/2022   MSPIKE Not Observed 08/16/2022   SPEI Comment 08/16/2022   Lab Results  Component Value Date   TIBC 184 (L) 06/13/2023   TIBC 191 (L) 05/28/2023   TIBC 244 (L) 05/08/2023   FERRITIN 807 (H) 06/13/2023   FERRITIN 1,166 (H) 05/28/2023   FERRITIN 279 05/08/2023   IRONPCTSAT 23 06/13/2023   IRONPCTSAT 29 05/28/2023   IRONPCTSAT 32 05/08/2023   Lab Results  Component Value Date   LDH 171 05/28/2023   LDH 110 04/25/2023   LDH 107 03/08/2023     STUDIES:   US  CHEST (PLEURAL EFFUSION) Result Date: 05/22/2023 INDICATION: History of worsening shortness of breath. Found to have a small right-sided pleural effusion. Request is for therapeutic and diagnostic right-sided thoracentesis EXAM: CHEST ULTRASOUND COMPARISON:  CT angio chest dated May 21, 2023 FINDINGS: Small right-sided pleural effusion. IMPRESSION: Small left-sided pleural effusion. After discussion of the risks versus benefits of the procedure the patient elected to defer the thoracentesis at this time. Read by: Delon Beagle, NP Electronically Signed   By: CHRISTELLA.  Shick M.D.   On: 05/22/2023 11:10   CT Angio Chest Pulmonary Embolism (PE) W or WO Contrast Result Date: 05/21/2023 CLINICAL DATA:  Worsening shortness of breath and tachypnea this morning. Pulmonary embolism suspected. Possible necrotizing pneumonia. EXAM: CT ANGIOGRAPHY CHEST WITH CONTRAST TECHNIQUE: Multidetector CT imaging of the chest was performed using the standard protocol during bolus administration of intravenous contrast. Multiplanar CT image reconstructions and MIPs were obtained to evaluate the vascular anatomy. RADIATION DOSE REDUCTION: This exam was performed according to the departmental dose-optimization program which includes automated exposure control, adjustment of the mA and/or kV according to patient size and/or use of iterative reconstruction technique. CONTRAST:  75mL OMNIPAQUE  IOHEXOL  350  MG/ML SOLN COMPARISON:  Chest radiographs and abdominal CT 05/19/2023. No prior chest CT. FINDINGS: Cardiovascular: The pulmonary arteries are well opacified with contrast to the level of the segmental branches. There is no evidence of acute pulmonary embolism. No acute systemic arterial abnormalities are identified. There is atherosclerosis of the aorta, great vessels and coronary arteries. The heart size is normal. There is no pericardial effusion. Mediastinum/Nodes: Mildly enlarged right paratracheal node measures 1.5 cm short axis on image 114/5. There are prominent right hilar lymph nodes measuring up to 1 cm in diameter. There is right pericardiac nodularity which may reflect small lymph nodes or pleural base nodules. No other enlarged mediastinal or axillary lymph nodes are seen. The thyroid gland, trachea and esophagus demonstrate no significant findings. Lungs/Pleura: Small dependent right pleural effusion has increased in volume compared with the recent abdominal CT. Interval increased volume loss in the right lower lobe with associated consolidation or ill-defined mass, slightly increased from the recent prior study. There are scattered small pulmonary nodules bilaterally which are primarily subpleural in location, including a 6 mm right middle lobe nodule on image 81/6, a 6 mm lingular nodule on image 82/6 and a 7 mm left lower lobe nodule on image 100/6, unchanged from recent abdominal CT. Additional scattered nodules are present measuring up to 6 mm anteriorly  in the left upper lobe on image 37/6. Upper abdomen: No acute findings are seen in the visualized upper abdomen. Musculoskeletal/Chest wall: There is no chest wall mass or suspicious osseous finding. Mild multilevel spondylosis. Review of the MIP images confirms the above findings. IMPRESSION: 1. No evidence of acute pulmonary embolism or other acute vascular findings in the chest. 2. Interval increased volume loss in the right lower lobe with  associated consolidation and enlarging adjacent pleural effusion, suspicious for pneumonia. Radiographic follow-up recommended to ensure resolution. 3. Scattered small pulmonary nodules bilaterally, primarily subpleural in location, nonspecific. Metastatic disease not excluded. Non-contrast chest CT at 3-6 months is recommended. If the nodules are stable at time of repeat CT, then future CT at 18-24 months (from today's scan) is considered optional for low-risk patients, but is recommended for high-risk patients. This recommendation follows the consensus statement: Guidelines for Management of Incidental Pulmonary Nodules Detected on CT Images: From the Fleischner Society 2017; Radiology 2017; 284:228-243. 4. Mildly enlarged right paratracheal and right hilar lymph nodes, also nonspecific and potentially reactive. Recommend attention on follow-up CT. 5.  Aortic Atherosclerosis (ICD10-I70.0). Electronically Signed   By: Elsie Perone M.D.   On: 05/21/2023 11:39   CT ABDOMEN PELVIS W CONTRAST Result Date: 05/19/2023 CLINICAL DATA:  Right groin pain x2 days. EXAM: CT ABDOMEN AND PELVIS WITH CONTRAST TECHNIQUE: Multidetector CT imaging of the abdomen and pelvis was performed using the standard protocol following bolus administration of intravenous contrast. RADIATION DOSE REDUCTION: This exam was performed according to the departmental dose-optimization program which includes automated exposure control, adjustment of the mA and/or kV according to patient size and/or use of iterative reconstruction technique. CONTRAST:  OMNIPAQUE  IOHEXOL  300 MG/ML  SOLN COMPARISON:  March 03, 2004 FINDINGS: Lower chest: A 5 mm pleural based lung nodule is seen within the anterior medial aspect of the right lower lobe (axial CT image 15, CT series 4). A 5 mm lung nodule is seen within the posterolateral aspect of the left upper lobe (axial CT image 2, CT series 4). An additional 2 mm lung nodule is seen within the  posterolateral aspect of the left lower lobe (axial CT image 15, CT series 4). Moderate to marked severity atelectasis and/or infiltrate is present within the posterior aspect of the right lower lobe. A 6.4 cm x 2.3 cm x 4.8 cm mass-like area of heterogeneous low attenuation is seen within the adjacent portion of the posteromedial right lung base. A very small right pleural effusion is noted. Hepatobiliary: No focal liver abnormality is seen. No gallstones, gallbladder wall thickening, or biliary dilatation. Pancreas: Unremarkable. No pancreatic ductal dilatation or surrounding inflammatory changes. Spleen: Normal in size without focal abnormality. Adrenals/Urinary Tract: Adrenal glands are unremarkable. Kidneys are normal, without renal calculi, focal lesion, or hydronephrosis. Bladder is unremarkable. Stomach/Bowel: Stomach is within normal limits. The appendix is not identified, with multiple surgical clips seen within the region posterior to the cecum. No evidence of bowel wall thickening, distention, or inflammatory changes. Small, noninflamed diverticula are seen throughout the sigmoid colon. Vascular/Lymphatic: Extensive aortic atherosclerosis with extensive calcification and atherosclerosis of the arterial structures of the pelvis and visualized lower extremities. A right common iliac artery stent is seen. Bypass graft material is seen along the left common femoral artery. No enlarged abdominal or pelvic lymph nodes. Reproductive: The prostate gland is mildly enlarged. Other: A 2.0 cm x 2.3 cm x 2.2 cm fat containing umbilical hernia is noted. No abdominopelvic ascites. Musculoskeletal: Marked severity multilevel degenerative  changes seen throughout the lumbar spine. IMPRESSION: 1. Moderate to marked severity right lower lobe atelectasis and/or infiltrate with a mass-like area of heterogeneous low attenuation within the adjacent portion of the posteromedial right lung base. While this may represent a focal  area of necrotizing pneumonia, correlation with dedicated chest CT and follow-up to resolution is recommended to exclude the presence of an underlying neoplastic process. 2. Very small right pleural effusion. 3. Multiple bilateral lung nodules, as described above. Dedicated nonemergent chest CT is again recommended. 4. Sigmoid diverticulosis. 5. Small fat containing umbilical hernia. 6. 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 7. Aortic atherosclerosis. Aortic Atherosclerosis (ICD10-I70.0). Electronically Signed   By: Suzen Dials M.D.   On: 05/19/2023 03:25   CT Hip Right Wo Contrast Result Date: 05/19/2023 CLINICAL DATA:  Right groin pain. EXAM: CT OF THE RIGHT HIP WITHOUT CONTRAST TECHNIQUE: Multidetector CT imaging of the right hip was performed according to the standard protocol. Multiplanar CT image reconstructions were also generated. RADIATION DOSE REDUCTION: This exam was performed according to the departmental dose-optimization program which includes automated exposure control, adjustment of the mA and/or kV according to patient size and/or use of iterative reconstruction technique. COMPARISON:  None Available. FINDINGS: Bones/Joint/Cartilage There is no evidence of an acute fracture or dislocation. Mild degenerative changes are seen involving the right hip in the form of joint space narrowing, acetabular sclerosis and lateral acetabular bony spurring. Moderate severity degenerative changes are also seen along the visualized portion of the right sacroiliac joint. Ligaments Suboptimally assessed by CT. Muscles and Tendons Unremarkable. Soft tissues There is marked severity vascular calcification. Mild to moderate severity prostatomegaly is also seen. No hernia is identified. IMPRESSION: 1. No acute osseous abnormality. 2. Mild degenerative changes involving the right hip. 3. Moderate severity degenerative changes along the visualized portion of the right  sacroiliac joint. 4. Mild to moderate severity prostatomegaly. Electronically Signed   By: Suzen Dials M.D.   On: 05/19/2023 02:47   US  Venous Img Lower Unilateral Right Result Date: 05/19/2023 CLINICAL DATA:  cough, right leg pain EXAM: Right LOWER EXTREMITY VENOUS DOPPLER ULTRASOUND TECHNIQUE: Gray-scale sonography with compression, as well as color and duplex ultrasound, were performed to evaluate the deep venous system(s) from the level of the common femoral vein through the popliteal and proximal calf veins. COMPARISON:  None Available. FINDINGS: VENOUS Normal compressibility of the common femoral, superficial femoral, and popliteal veins, as well as the visualized calf veins. Visualized portions of profunda femoral vein and great saphenous vein unremarkable. No filling defects to suggest DVT on grayscale or color Doppler imaging. Doppler waveforms show normal direction of venous flow, normal respiratory plasticity and response to augmentation. Limited views of the contralateral common femoral vein are unremarkable. OTHER None. Limitations: none IMPRESSION: Negative. Electronically Signed   By: Morgane  Naveau M.D.   On: 05/19/2023 00:41   DG Chest 2 View Result Date: 05/19/2023 CLINICAL DATA:  cough, right leg pain EXAM: CHEST - 2 VIEW COMPARISON:  Chest x-ray 12/09/2021 FINDINGS: The heart and mediastinal contours are grossly unchanged given low lung volumes. Low lung volumes. No focal consolidation. No pulmonary edema. No pleural effusion. No pneumothorax. No acute osseous abnormality. IMPRESSION: Low lung volumes with no active cardiopulmonary disease. Electronically Signed   By: Morgane  Naveau M.D.   On: 05/19/2023 00:37

## 2023-06-13 ENCOUNTER — Inpatient Hospital Stay: Payer: Medicare PPO

## 2023-06-13 ENCOUNTER — Inpatient Hospital Stay: Payer: Medicare PPO | Admitting: Hematology

## 2023-06-13 ENCOUNTER — Other Ambulatory Visit: Payer: Self-pay

## 2023-06-13 ENCOUNTER — Inpatient Hospital Stay: Payer: Medicare PPO | Attending: Hematology

## 2023-06-13 VITALS — BP 149/72 | HR 104 | Temp 99.0°F | Resp 18 | Ht 67.0 in | Wt 173.5 lb

## 2023-06-13 DIAGNOSIS — D469 Myelodysplastic syndrome, unspecified: Secondary | ICD-10-CM

## 2023-06-13 DIAGNOSIS — Z79899 Other long term (current) drug therapy: Secondary | ICD-10-CM | POA: Diagnosis not present

## 2023-06-13 DIAGNOSIS — D649 Anemia, unspecified: Secondary | ICD-10-CM

## 2023-06-13 LAB — COMPREHENSIVE METABOLIC PANEL
ALT: 46 U/L — ABNORMAL HIGH (ref 0–44)
AST: 44 U/L — ABNORMAL HIGH (ref 15–41)
Albumin: 2.8 g/dL — ABNORMAL LOW (ref 3.5–5.0)
Alkaline Phosphatase: 93 U/L (ref 38–126)
Anion gap: 9 (ref 5–15)
BUN: 17 mg/dL (ref 8–23)
CO2: 22 mmol/L (ref 22–32)
Calcium: 9.4 mg/dL (ref 8.9–10.3)
Chloride: 98 mmol/L (ref 98–111)
Creatinine, Ser: 1.03 mg/dL (ref 0.61–1.24)
GFR, Estimated: >60 mL/min (ref >60)
Glucose, Bld: 122 mg/dL — ABNORMAL HIGH (ref 70–99)
Potassium: 3.9 mmol/L (ref 3.5–5.1)
Sodium: 129 mmol/L — ABNORMAL LOW (ref 135–145)
Total Bilirubin: 0.5 mg/dL (ref 0.0–1.2)
Total Protein: 7.4 g/dL (ref 6.5–8.1)

## 2023-06-13 LAB — CBC WITH DIFFERENTIAL/PLATELET
Abs Immature Granulocytes: 0.03 10*3/uL (ref 0.00–0.07)
Basophils Absolute: 0 10*3/uL (ref 0.0–0.1)
Basophils Relative: 0 %
Eosinophils Absolute: 0 10*3/uL (ref 0.0–0.5)
Eosinophils Relative: 1 %
HCT: 21.7 % — ABNORMAL LOW (ref 39.0–52.0)
Hemoglobin: 7.1 g/dL — ABNORMAL LOW (ref 13.0–17.0)
Immature Granulocytes: 1 %
Lymphocytes Relative: 25 %
Lymphs Abs: 1.1 10*3/uL (ref 0.7–4.0)
MCH: 36.6 pg — ABNORMAL HIGH (ref 26.0–34.0)
MCHC: 32.7 g/dL (ref 30.0–36.0)
MCV: 111.9 fL — ABNORMAL HIGH (ref 80.0–100.0)
Monocytes Absolute: 0.4 10*3/uL (ref 0.1–1.0)
Monocytes Relative: 8 %
Neutro Abs: 3 10*3/uL (ref 1.7–7.7)
Neutrophils Relative %: 65 %
Platelets: 89 10*3/uL — ABNORMAL LOW (ref 150–400)
RBC: 1.94 MIL/uL — ABNORMAL LOW (ref 4.22–5.81)
RDW: 20.1 % — ABNORMAL HIGH (ref 11.5–15.5)
Smear Review: DECREASED
WBC: 4.5 10*3/uL (ref 4.0–10.5)
nRBC: 0 % (ref 0.0–0.2)

## 2023-06-13 LAB — IRON AND TIBC
Iron: 43 ug/dL — ABNORMAL LOW (ref 45–182)
Saturation Ratios: 23 % (ref 17.9–39.5)
TIBC: 184 ug/dL — ABNORMAL LOW (ref 250–450)
UIBC: 141 ug/dL

## 2023-06-13 LAB — FERRITIN: Ferritin: 807 ng/mL — ABNORMAL HIGH (ref 24–336)

## 2023-06-13 LAB — SAMPLE TO BLOOD BANK

## 2023-06-13 LAB — PREPARE RBC (CROSSMATCH)

## 2023-06-13 MED ORDER — MEGESTROL ACETATE 400 MG/10ML PO SUSP: 400.0000 mg | Freq: Two times a day (BID) | ORAL | 3 refills | Status: DC

## 2023-06-13 MED ORDER — LENALIDOMIDE 5 MG PO CAPS: 5.0000 mg | ORAL_CAPSULE | Freq: Every day | ORAL | 0 refills | Status: DC

## 2023-06-13 NOTE — Telephone Encounter (Signed)
 Chart reviewed. Revlimid  refilled per today's office visit with Dr. Cheree Cords.

## 2023-06-13 NOTE — Patient Instructions (Addendum)
 [facility] at [facility] Discharge Instructions   You were seen and examined today by Dr. Rogers.  He reviewed the results of your lab work which are mostly normal/stable. Your hemoglobin is low at 7.1. We will plan to give you a unit of blood tomorrow.   Dr. MARLA wants you to restart the Revlimid .   We sent a prescription to your pharmacy. This is a liquid called Megace . This is to help stimulate your appetite. You take 2 teaspoons twice a day.   We will see you back in 2 weeks. We will repeat lab work at that visit.    Return as scheduled.    Thank you for choosing [facility] at [facility] to provide your oncology and hematology care.  To afford each patient quality time with our provider, please arrive at least 15 minutes before your scheduled appointment time.   If you have a lab appointment with the Cancer Center please come in thru the Main Entrance and check in at the main information desk.  You need to re-schedule your appointment should you arrive 10 or more minutes late.  We strive to give you quality time with our providers, and arriving late affects you and other patients whose appointments are after yours.  Also, if you no show three or more times for appointments you may be dismissed from the clinic at the providers discretion.     Again, thank you for choosing [facility].  Our hope is that these requests will decrease the amount of time that you wait before being seen by our physicians.       _____________________________________________________________  Should you have questions after your visit to [facility], please contact our office at [phone] and follow the prompts.  Our office hours are 8:00 a.m. and 4:30 p.m. Monday - Friday.  Please note that voicemails left after 4:00 p.m. may not be returned until the following business day.  We are closed weekends and major holidays.  You do have  access to a nurse 24-7, just call the main number to the clinic [phone] and do not press any options, hold on the line and a nurse will answer the phone.    For prescription refill requests, have your pharmacy contact our office and allow 72 hours.    Due to Covid, you will need to wear a mask upon entering the hospital. If you do not have a mask, a mask will be given to you at the Main Entrance upon arrival. For doctor visits, patients may have 1 support person age 45 or older with them. For treatment visits, patients can not have anyone with them due to social distancing guidelines and our immunocompromised population.

## 2023-06-14 ENCOUNTER — Inpatient Hospital Stay: Payer: Medicare PPO

## 2023-06-14 ENCOUNTER — Telehealth: Payer: Self-pay | Admitting: Dietician

## 2023-06-14 ENCOUNTER — Inpatient Hospital Stay: Payer: Medicare PPO | Admitting: Dietician

## 2023-06-14 DIAGNOSIS — D469 Myelodysplastic syndrome, unspecified: Secondary | ICD-10-CM

## 2023-06-14 DIAGNOSIS — Z79899 Other long term (current) drug therapy: Secondary | ICD-10-CM | POA: Diagnosis not present

## 2023-06-14 DIAGNOSIS — D649 Anemia, unspecified: Secondary | ICD-10-CM

## 2023-06-14 MED ORDER — SODIUM CHLORIDE 0.9% IV SOLUTION
250.0000 mL | INTRAVENOUS | Status: DC
  Administered 2023-06-14: 100 mL via INTRAVENOUS

## 2023-06-14 NOTE — Progress Notes (Signed)
 Patient presents today for 1 unit of blood. Vital signs stable. Consent and attestation on file. Patient took Tylenol  650 mg PO and Benadryl  25 mg PO prior to arrival at 07:30 am .   Treatment given today per MD orders. Tolerated infusion without adverse affects. Vital signs stable. No complaints at this time. Discharged from clinic by wheel chair in stable condition. Alert and oriented x 3. F/U with [facility] as scheduled.

## 2023-06-14 NOTE — Patient Instructions (Signed)
 [facility] - A DEPT [facility]  Discharge Instructions: Thank you for choosing [facility] to provide your oncology and hematology care.  If you have a lab appointment with the Cancer Center - please note that after April 8th, 2024, all labs will be drawn in the cancer center.  You do not have to check in or register with the main entrance as you have in the past but will complete your check-in in the cancer center.  Wear comfortable clothing and clothing appropriate for easy access to any Portacath or PICC line.   We strive to give you quality time with your provider. You may need to reschedule your appointment if you arrive late (15 or more minutes).  Arriving late affects you and other patients whose appointments are after yours.  Also, if you miss three or more appointments without notifying the office, you may be dismissed from the clinic at the provider's discretion.      For prescription refill requests, have your pharmacy contact our office and allow 72 hours for refills to be completed.    Today you received the following chemotherapy and/or immunotherapy agents blood products. Blood Transfusion, Adult, Care After The following information offers guidance on how to care for yourself after your procedure. Your health care provider may also give you more specific instructions. If you have problems or questions, contact your health care provider. What can I expect after the procedure? After the procedure, it is common to have: Bruising and soreness where the IV was inserted. A headache. Follow these instructions at home: IV insertion site care     Follow instructions from your health care provider about how to take care of your IV insertion site. Make sure you: Wash your hands with soap and water for at least 20 seconds before and after you change your bandage (dressing). If soap and water are not available, use hand sanitizer. Change  your dressing as told by your health care provider. Check your IV insertion site every day for signs of infection. Check for: Redness, swelling, or pain. Bleeding from the site. Warmth. Pus or a bad smell. General instructions Take over-the-counter and prescription medicines only as told by your health care provider. Rest as told by your health care provider. Return to your normal activities as told by your health care provider. Keep all follow-up visits. Lab tests may need to be done at certain periods to recheck your blood counts. Contact a health care provider if: You have itching or red, swollen areas of skin (hives). You have a fever or chills. You have pain in the head, back, or chest. You feel anxious or you feel weak after doing your normal activities. You have redness, swelling, warmth, or pain around the IV insertion site. You have blood coming from the IV insertion site that does not stop with pressure. You have pus or a bad smell coming from your IV insertion site. If you received your blood transfusion in an outpatient setting, you will be told whom to contact to report any reactions. Get help right away if: You have symptoms of a serious allergic or immune system reaction, including: Trouble breathing or shortness of breath. Swelling of the face, feeling flushed, or widespread rash. Dark urine or blood in the urine. Fast heartbeat. These symptoms may be an emergency. Get help right away. Call 911. Do not wait to see if the symptoms will go away. Do not drive yourself  to the hospital. Summary Bruising and soreness around the IV insertion site are common. Check your IV insertion site every day for signs of infection. Rest as told by your health care provider. Return to your normal activities as told by your health care provider. Get help right away for symptoms of a serious allergic or immune system reaction to the blood transfusion. This information is not intended to  replace advice given to you by your health care provider. Make sure you discuss any questions you have with your health care provider. Document Revised: 07/22/2021 Document Reviewed: 07/22/2021 Elsevier Patient Education  2024 Elsevier Inc.      To help prevent nausea and vomiting after your treatment, we encourage you to take your nausea medication as directed.  BELOW ARE SYMPTOMS THAT SHOULD BE REPORTED IMMEDIATELY: *FEVER GREATER THAN 100.4 F (38 C) OR HIGHER *CHILLS OR SWEATING *NAUSEA AND VOMITING THAT IS NOT CONTROLLED WITH YOUR NAUSEA MEDICATION *UNUSUAL SHORTNESS OF BREATH *UNUSUAL BRUISING OR BLEEDING *URINARY PROBLEMS (pain or burning when urinating, or frequent urination) *BOWEL PROBLEMS (unusual diarrhea, constipation, pain near the anus) TENDERNESS IN MOUTH AND THROAT WITH OR WITHOUT PRESENCE OF ULCERS (sore throat, sores in mouth, or a toothache) UNUSUAL RASH, SWELLING OR PAIN  UNUSUAL VAGINAL DISCHARGE OR ITCHING   Items with * indicate a potential emergency and should be followed up as soon as possible or go to the Emergency Department if any problems should occur.  Please show the CHEMOTHERAPY ALERT CARD or IMMUNOTHERAPY ALERT CARD at check-in to the Emergency Department and triage nurse.  Should you have questions after your visit or need to cancel or reschedule your appointment, please contact [facility] [place] - A DEPT OF Eligha Bridegroom [facility] [phone]  and follow the prompts.  Office hours are 8:00 a.m. to 4:30 p.m. Monday - Friday. Please note that voicemails left after 4:00 p.m. may not be returned until the following business day.  We are closed weekends and major holidays. You have access to a nurse at all times for urgent questions. Please call the main number to the clinic [phone] and follow the prompts.  For any non-urgent questions, you may also contact your provider using MyChart. We now offer e-Visits for anyone 62 and older to  request care online for non-urgent symptoms. For details visit [email or web address].   Also download the MyChart app! Go to the app store, search "MyChart", open the app, select [place], and log in with your MyChart username and password.

## 2023-06-14 NOTE — Telephone Encounter (Signed)
 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 Treatment held 1/10 due to hospitalization. Restarted 2/5.  1/10-1/15 admission - [facility]  Spoke with patient and wife via telephone. Patient reports feeling some better s/p blood products this morning. He reports eating a really good lunch today - ate 6 in sub with roast beef, ham, turkey. Patient usually eats a good breakfast, however early appt today did not allow for that. His appetite has not been good the last couple of weeks. Wife reports pt did not like strawberry Ensure. It took 4 days for him to drink one. Patient had first dose of Megace  for appetite last night. He denies nausea, vomiting, diarrhea, constipation.    Medications: megace  (2/5)  Labs: 2/5 - Hgb 7.1, Na 129, glucose 122, albumin  2.8  Anthropometrics: Wt 173 lb 8 oz on 2/5 decreased ~2% in 2 weeks - significant  1/20 - 176 lb 12.8 oz  NUTRITION DIAGNOSIS: Unintended wt loss continues    INTERVENTION:  Continued encouragement for smaller meals/snacks q2-3h Encourage soft moist textures and high calorie/protein foods Patient agreeable to bedtime snack Megace  for appetite per MD    MONITORING, EVALUATION, GOAL: wt trends, intake   NEXT VISIT: To be scheduled in collaboration with upcoming [facility] appointments

## 2023-06-15 LAB — TYPE AND SCREEN
ABO/RH(D): B POS
Antibody Screen: NEGATIVE
Unit division: 0

## 2023-06-15 LAB — BPAM RBC
Blood Product Expiration Date: 202502142359
ISSUE DATE / TIME: 202502060824
Unit Type and Rh: 1700

## 2023-06-17 ENCOUNTER — Other Ambulatory Visit: Payer: Self-pay

## 2023-06-17 ENCOUNTER — Encounter (HOSPITAL_COMMUNITY): Payer: Self-pay | Admitting: Emergency Medicine

## 2023-06-17 ENCOUNTER — Inpatient Hospital Stay (HOSPITAL_COMMUNITY)
Admission: EM | Admit: 2023-06-17 | Discharge: 2023-06-20 | DRG: 180 | Disposition: A | Discharge status: Hospice | Payer: Medicare PPO | Attending: Student in an Organized Health Care Education/Training Program | Admitting: Student in an Organized Health Care Education/Training Program

## 2023-06-17 ENCOUNTER — Emergency Department (HOSPITAL_COMMUNITY): Payer: Medicare PPO

## 2023-06-17 DIAGNOSIS — J90 Pleural effusion, not elsewhere classified: Secondary | ICD-10-CM | POA: Diagnosis not present

## 2023-06-17 DIAGNOSIS — D696 Thrombocytopenia, unspecified: Secondary | ICD-10-CM | POA: Diagnosis present

## 2023-06-17 DIAGNOSIS — Z1152 Encounter for screening for COVID-19: Secondary | ICD-10-CM | POA: Diagnosis not present

## 2023-06-17 DIAGNOSIS — C7951 Secondary malignant neoplasm of bone: Secondary | ICD-10-CM | POA: Diagnosis present

## 2023-06-17 DIAGNOSIS — Z515 Encounter for palliative care: Secondary | ICD-10-CM | POA: Diagnosis not present

## 2023-06-17 DIAGNOSIS — C771 Secondary and unspecified malignant neoplasm of intrathoracic lymph nodes: Secondary | ICD-10-CM | POA: Diagnosis present

## 2023-06-17 DIAGNOSIS — E8809 Other disorders of plasma-protein metabolism, not elsewhere classified: Secondary | ICD-10-CM | POA: Diagnosis present

## 2023-06-17 DIAGNOSIS — K219 Gastro-esophageal reflux disease without esophagitis: Secondary | ICD-10-CM | POA: Diagnosis present

## 2023-06-17 DIAGNOSIS — I10 Essential (primary) hypertension: Secondary | ICD-10-CM | POA: Diagnosis not present

## 2023-06-17 DIAGNOSIS — R410 Disorientation, unspecified: Secondary | ICD-10-CM | POA: Diagnosis not present

## 2023-06-17 DIAGNOSIS — Z808 Family history of malignant neoplasm of other organs or systems: Secondary | ICD-10-CM

## 2023-06-17 DIAGNOSIS — N1831 Chronic kidney disease, stage 3a: Secondary | ICD-10-CM | POA: Diagnosis present

## 2023-06-17 DIAGNOSIS — R531 Weakness: Principal | ICD-10-CM

## 2023-06-17 DIAGNOSIS — Z79899 Other long term (current) drug therapy: Secondary | ICD-10-CM

## 2023-06-17 DIAGNOSIS — C801 Malignant (primary) neoplasm, unspecified: Secondary | ICD-10-CM | POA: Diagnosis not present

## 2023-06-17 DIAGNOSIS — R918 Other nonspecific abnormal finding of lung field: Secondary | ICD-10-CM | POA: Diagnosis not present

## 2023-06-17 DIAGNOSIS — I129 Hypertensive chronic kidney disease with stage 1 through stage 4 chronic kidney disease, or unspecified chronic kidney disease: Secondary | ICD-10-CM | POA: Diagnosis present

## 2023-06-17 DIAGNOSIS — I21A1 Myocardial infarction type 2: Secondary | ICD-10-CM | POA: Diagnosis present

## 2023-06-17 DIAGNOSIS — C799 Secondary malignant neoplasm of unspecified site: Secondary | ICD-10-CM | POA: Insufficient documentation

## 2023-06-17 DIAGNOSIS — R627 Adult failure to thrive: Secondary | ICD-10-CM | POA: Diagnosis present

## 2023-06-17 DIAGNOSIS — M19042 Primary osteoarthritis, left hand: Secondary | ICD-10-CM | POA: Diagnosis present

## 2023-06-17 DIAGNOSIS — J910 Malignant pleural effusion: Secondary | ICD-10-CM | POA: Diagnosis present

## 2023-06-17 DIAGNOSIS — I700 Atherosclerosis of aorta: Secondary | ICD-10-CM | POA: Diagnosis not present

## 2023-06-17 DIAGNOSIS — J811 Chronic pulmonary edema: Secondary | ICD-10-CM | POA: Diagnosis not present

## 2023-06-17 DIAGNOSIS — D509 Iron deficiency anemia, unspecified: Secondary | ICD-10-CM | POA: Diagnosis present

## 2023-06-17 DIAGNOSIS — D539 Nutritional anemia, unspecified: Secondary | ICD-10-CM | POA: Diagnosis present

## 2023-06-17 DIAGNOSIS — Z888 Allergy status to other drugs, medicaments and biological substances status: Secondary | ICD-10-CM

## 2023-06-17 DIAGNOSIS — M170 Bilateral primary osteoarthritis of knee: Secondary | ICD-10-CM | POA: Diagnosis present

## 2023-06-17 DIAGNOSIS — I2510 Atherosclerotic heart disease of native coronary artery without angina pectoris: Secondary | ICD-10-CM | POA: Diagnosis present

## 2023-06-17 DIAGNOSIS — D469 Myelodysplastic syndrome, unspecified: Secondary | ICD-10-CM | POA: Diagnosis present

## 2023-06-17 DIAGNOSIS — Z7902 Long term (current) use of antithrombotics/antiplatelets: Secondary | ICD-10-CM | POA: Diagnosis not present

## 2023-06-17 DIAGNOSIS — Z833 Family history of diabetes mellitus: Secondary | ICD-10-CM | POA: Diagnosis not present

## 2023-06-17 DIAGNOSIS — E871 Hypo-osmolality and hyponatremia: Secondary | ICD-10-CM | POA: Diagnosis present

## 2023-06-17 DIAGNOSIS — Z8249 Family history of ischemic heart disease and other diseases of the circulatory system: Secondary | ICD-10-CM

## 2023-06-17 DIAGNOSIS — Z801 Family history of malignant neoplasm of trachea, bronchus and lung: Secondary | ICD-10-CM

## 2023-06-17 DIAGNOSIS — Z95820 Peripheral vascular angioplasty status with implants and grafts: Secondary | ICD-10-CM

## 2023-06-17 DIAGNOSIS — Z48813 Encounter for surgical aftercare following surgery on the respiratory system: Secondary | ICD-10-CM | POA: Diagnosis not present

## 2023-06-17 DIAGNOSIS — Z7982 Long term (current) use of aspirin: Secondary | ICD-10-CM

## 2023-06-17 DIAGNOSIS — R059 Cough, unspecified: Secondary | ICD-10-CM | POA: Diagnosis not present

## 2023-06-17 DIAGNOSIS — I639 Cerebral infarction, unspecified: Secondary | ICD-10-CM | POA: Diagnosis not present

## 2023-06-17 DIAGNOSIS — Z87891 Personal history of nicotine dependence: Secondary | ICD-10-CM

## 2023-06-17 DIAGNOSIS — E43 Unspecified severe protein-calorie malnutrition: Secondary | ICD-10-CM | POA: Diagnosis present

## 2023-06-17 DIAGNOSIS — I517 Cardiomegaly: Secondary | ICD-10-CM | POA: Diagnosis not present

## 2023-06-17 DIAGNOSIS — Z6827 Body mass index (BMI) 27.0-27.9, adult: Secondary | ICD-10-CM

## 2023-06-17 DIAGNOSIS — E782 Mixed hyperlipidemia: Secondary | ICD-10-CM | POA: Diagnosis present

## 2023-06-17 DIAGNOSIS — Z7961 Long term (current) use of immunomodulator: Secondary | ICD-10-CM

## 2023-06-17 DIAGNOSIS — R590 Localized enlarged lymph nodes: Secondary | ICD-10-CM | POA: Diagnosis not present

## 2023-06-17 DIAGNOSIS — C3431 Malignant neoplasm of lower lobe, right bronchus or lung: Secondary | ICD-10-CM | POA: Diagnosis present

## 2023-06-17 DIAGNOSIS — R7401 Elevation of levels of liver transaminase levels: Secondary | ICD-10-CM | POA: Diagnosis not present

## 2023-06-17 DIAGNOSIS — I672 Cerebral atherosclerosis: Secondary | ICD-10-CM | POA: Diagnosis not present

## 2023-06-17 DIAGNOSIS — E538 Deficiency of other specified B group vitamins: Secondary | ICD-10-CM | POA: Diagnosis present

## 2023-06-17 DIAGNOSIS — I7121 Aneurysm of the ascending aorta, without rupture: Secondary | ICD-10-CM | POA: Diagnosis not present

## 2023-06-17 DIAGNOSIS — M19041 Primary osteoarthritis, right hand: Secondary | ICD-10-CM | POA: Diagnosis present

## 2023-06-17 DIAGNOSIS — Z9049 Acquired absence of other specified parts of digestive tract: Secondary | ICD-10-CM

## 2023-06-17 DIAGNOSIS — Z860100 Personal history of colon polyps, unspecified: Secondary | ICD-10-CM

## 2023-06-17 DIAGNOSIS — Z8349 Family history of other endocrine, nutritional and metabolic diseases: Secondary | ICD-10-CM

## 2023-06-17 LAB — CBC WITH DIFFERENTIAL/PLATELET
Abs Immature Granulocytes: 0.02 10*3/uL (ref 0.00–0.07)
Basophils Absolute: 0 10*3/uL (ref 0.0–0.1)
Basophils Relative: 0 %
Eosinophils Absolute: 0.1 10*3/uL (ref 0.0–0.5)
Eosinophils Relative: 1 %
HCT: 22.6 % — ABNORMAL LOW (ref 39.0–52.0)
Hemoglobin: 7.5 g/dL — ABNORMAL LOW (ref 13.0–17.0)
Immature Granulocytes: 1 %
Lymphocytes Relative: 26 %
Lymphs Abs: 1.1 10*3/uL (ref 0.7–4.0)
MCH: 35.2 pg — ABNORMAL HIGH (ref 26.0–34.0)
MCHC: 33.2 g/dL (ref 30.0–36.0)
MCV: 106.1 fL — ABNORMAL HIGH (ref 80.0–100.0)
Monocytes Absolute: 0.3 10*3/uL (ref 0.1–1.0)
Monocytes Relative: 7 %
Neutro Abs: 2.8 10*3/uL (ref 1.7–7.7)
Neutrophils Relative %: 65 %
Platelets: 62 10*3/uL — ABNORMAL LOW (ref 150–400)
RBC: 2.13 MIL/uL — ABNORMAL LOW (ref 4.22–5.81)
RDW: 21.1 % — ABNORMAL HIGH (ref 11.5–15.5)
WBC: 4.3 10*3/uL (ref 4.0–10.5)
nRBC: 0 % (ref 0.0–0.2)

## 2023-06-17 LAB — COMPREHENSIVE METABOLIC PANEL
ALT: 108 U/L — ABNORMAL HIGH (ref 0–44)
AST: 144 U/L — ABNORMAL HIGH (ref 15–41)
Albumin: 2.5 g/dL — ABNORMAL LOW (ref 3.5–5.0)
Alkaline Phosphatase: 140 U/L — ABNORMAL HIGH (ref 38–126)
Anion gap: 10 (ref 5–15)
BUN: 19 mg/dL (ref 8–23)
CO2: 20 mmol/L — ABNORMAL LOW (ref 22–32)
Calcium: 10 mg/dL (ref 8.9–10.3)
Chloride: 99 mmol/L (ref 98–111)
Creatinine, Ser: 1.1 mg/dL (ref 0.61–1.24)
GFR, Estimated: >60 mL/min (ref >60)
Glucose, Bld: 105 mg/dL — ABNORMAL HIGH (ref 70–99)
Potassium: 3.9 mmol/L (ref 3.5–5.1)
Sodium: 129 mmol/L — ABNORMAL LOW (ref 135–145)
Total Bilirubin: 0.7 mg/dL (ref 0.0–1.2)
Total Protein: 6.9 g/dL (ref 6.5–8.1)

## 2023-06-17 LAB — URINALYSIS, ROUTINE W REFLEX MICROSCOPIC
Bacteria, UA: NONE SEEN
Bilirubin Urine: NEGATIVE
Glucose, UA: NEGATIVE mg/dL
Hgb urine dipstick: NEGATIVE
Ketones, ur: NEGATIVE mg/dL
Leukocytes,Ua: NEGATIVE
Nitrite: NEGATIVE
Protein, ur: 30 mg/dL — AB
Specific Gravity, Urine: 1.019 (ref 1.005–1.030)
pH: 5 (ref 5.0–8.0)

## 2023-06-17 LAB — TROPONIN I (HIGH SENSITIVITY)
Troponin I (High Sensitivity): 35 ng/L — ABNORMAL HIGH (ref <18)
Troponin I (High Sensitivity): 37 ng/L — ABNORMAL HIGH (ref <18)

## 2023-06-17 LAB — AMMONIA: Ammonia: <10 umol/L (ref 9–35)

## 2023-06-17 LAB — RESP PANEL BY RT-PCR (RSV, FLU A&B, COVID)  RVPGX2
Influenza A by PCR: NEGATIVE
Influenza B by PCR: NEGATIVE
Resp Syncytial Virus by PCR: NEGATIVE
SARS Coronavirus 2 by RT PCR: NEGATIVE

## 2023-06-17 LAB — LIPASE, BLOOD: Lipase: 39 U/L (ref 11–51)

## 2023-06-17 MED ORDER — SODIUM CHLORIDE 0.9 % IV BOLUS
1000.0000 mL | Freq: Once | INTRAVENOUS | Status: AC
  Administered 2023-06-17: 1000 mL via INTRAVENOUS

## 2023-06-17 MED ORDER — IOHEXOL 300 MG/ML  SOLN
75.0000 mL | Freq: Once | INTRAMUSCULAR | Status: AC | PRN
  Administered 2023-06-17: 75 mL via INTRAVENOUS

## 2023-06-17 NOTE — ED Notes (Signed)
 Gave PT urinal and made aware that we needed a urine sample

## 2023-06-17 NOTE — ED Provider Notes (Addendum)
 [place] EMERGENCY DEPARTMENT AT [facility] Provider Note  CSN: [id number] Arrival date & time: 06/17/23 1821  Chief Complaint(s) Weakness  HPI Aaron Sexton is a 78 y.o. male with past medical history as below, significant for ASVD, GERD, HLD, HTN, PVD, MDS f/w Dr Ivana who presents to the ED with complaint of weakness.   Patient is here with family.  Reports that over the past 2 days he has been very weak, unable to get out of bed, not eating or drinking.  He has been hallucinating intermittently.  Has not been walking, usually uses a walker at baseline.  Could not get out of bed due to weakness.  He was seen by Dr. Rogers last week, given blood transfusion 2/6. He takes Revlimid  last 2/5. Per oncology notes his appetite has been poor over the last few weeks but seems to have worsened in the last days/week. He was started on Megace  on 2/5 for appetite stimulation.    Patient denies any acute complaints at this time, reports he is ready go home.  Past Medical History 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   Cerebrovascular disease    Moderate ASVD without focal stenosis in 01/2007   Clotting disorder (HCC)    Colon polyps 07/2010   single 2mm polyp--tubular adenoma   Diverticulosis 2012   found on colonoscopy   Erectile dysfunction    Family history of adverse reaction to anesthesia    difficult for son & sistor to wake    GERD (gastroesophageal reflux disease)    Hematochezia 05/24/2010   Hyperlipidemia    Hypertension    Osteoarthritis    knees/hands-Dr Lucey   Overweight(278.02)    Peripheral vascular disease (HCC) 03/11/2010   Moderate SFA stenosis; history of claudication   Pneumonia    Rosacea    Vitamin B12 deficiency    Patient Active Problem List   Diagnosis Date Noted   CKD stage 3a, GFR 45-59 ml/min (HCC) - baseline Scr  1.2-1.6 05/23/2023   Hyponatremia 05/23/2023   Hypokalemia 05/23/2023   Community acquired pneumonia 05/19/2023   Right hip pain 05/19/2023   Preventative health care 02/12/2023   MDS (myelodysplastic syndrome) (HCC) 01/01/2023   History of colonic polyps 10/27/2022   Essential hypertension, benign 02/09/2022   PAD (peripheral artery disease) (HCC) 12/09/2021   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) 11/29/2021   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   Lobar pneumonia (HCC) 09/07/2017   Carotid arterial disease (HCC) 11/03/2015   Claudication (HCC) 08/09/2015   Advance directive discussed with patient 08/25/2014   Anemia 08/19/2013   Antiplatelet or antithrombotic long-term use 08/16/2012   Adenomatous colon polyp    Vitamin B12 deficiency    Arteriosclerotic cardiovascular disease (ASCVD)    Cerebrovascular disease    Peripheral vascular disease (HCC) 03/11/2010   Obesity, unspecified 03/08/2009   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   HYPERLIPIDEMIA 12/07/2006   Gastroesophageal reflux disease 11/30/2006   Home Medication(s) Prior to Admission medications   Medication Sig Start Date End Date Taking? Authorizing Provider  acetaminophen  (TYLENOL ) 500 MG tablet Take 1,000 mg by mouth every 6 (six) hours as needed for mild pain (pain score 1-3).   Yes [provider]  aspirin  81 MG tablet Take 81 mg by mouth every evening.   Yes [provider]  clopidogrel  (PLAVIX ) 75 MG tablet TAKE 1 TABLET(75 MG)  BY MOUTH DAILY WITH BREAKFAST 11/24/22  Yes Branch, Dorn FALCON, MD  Cyanocobalamin  (VITAMIN B-12) 500 MCG SUBL Place 500 mcg under the tongue daily.   Yes [provider]  diphenhydrAMINE  (BENADRYL ) 25 mg capsule Take 25 mg by mouth every 6 (six) hours as needed for allergies.   Yes [provider]  lenalidomide  (REVLIMID ) 5 MG capsule Take 1 capsule (5 mg total) by mouth daily. TAKE 1 CAPSULE (5MG ) BY MOUTH EVERY DAY 06/13/23  Yes Rogers Hai, MD  loperamide  (IMODIUM  A-D) 2 MG tablet Take 2 mg by mouth 4 (four) times daily as needed for diarrhea or loose stools.   Yes [provider]  losartan  (COZAAR ) 25 MG tablet TAKE 1 TABLET(25 MG) BY MOUTH DAILY 02/01/23  Yes Letvak, Richard I, MD  megestrol  (MEGACE ) 400 MG/10ML suspension Take 10 mLs (400 mg total) by mouth 2 (two) times daily. 06/13/23  Yes Katragadda, Sreedhar, MD  metoprolol  succinate (TOPROL -XL) 100 MG 24 hr tablet TAKE 1 TABLET(100 MG) BY MOUTH DAILY WITH OR IMMEDIATELY FOLLOWING A MEAL 01/01/23  Yes Branch, Dorn FALCON, MD  nitroGLYCERIN  (NITROSTAT ) 0.4 MG SL tablet Place 1 tablet (0.4 mg total) under the tongue every 5 (five) minutes as needed for chest pain. 10/12/21  Yes Court Dorn PARAS, MD  Omega-3 Fatty Acids (FISH OIL) 1000 MG CAPS Take 1,000 mg by mouth daily.   Yes [provider]  omeprazole (PRILOSEC) 20 MG capsule Take 20 mg by mouth every other day.   Yes [provider]  pravastatin  (PRAVACHOL ) 40 MG tablet TAKE 1 TABLET(40 MG) BY MOUTH DAILY 01/01/23  Yes Letvak, Richard I, MD  traMADol  (ULTRAM ) 50 MG tablet TAKE 1 TABLET BY MOUTH EVERY 8 HOURS AS NEEDED Patient taking differently: Take 50 mg by mouth every 8 (eight) hours as needed for moderate pain (pain score 4-6). 04/16/23  Yes Jimmy Charlie FERNS, MD                                                                                                                                    Past Surgical History Past Surgical History:  Procedure Laterality Date   APPENDECTOMY     APPLICATION OF WOUND VAC Left 12/17/2021   Procedure: APPLICATION OF PREVENA WOUND VAC LEFT GROIN;  Surgeon: Lanis Fonda BRAVO, MD;  Location: [facility] OR;  Service: Vascular;  Laterality: Left;   APPLICATION OF WOUND VAC Left 01/08/2022   Procedure: APPLICATION OF WOUND VAC left groin.;  Surgeon: Gretta Lonni PARAS, MD;  Location: [facility] OR;  Service: Vascular;  Laterality: Left;   COLONOSCOPY W/ POLYPECTOMY  2012    ENDARTERECTOMY FEMORAL Left 12/09/2021   Procedure: LEFT COMMON FEMORAL ENDARTERECTOMY WITH 1 CM X 6 CM XENOSURE BOVINE PATCH ANGIOPLASTY;  Surgeon: Gretta Lonni PARAS, MD;  Location: [facility] OR;  Service: Vascular;  Laterality: Left;   FEMORAL-POPLITEAL BYPASS GRAFT Left 12/09/2021   Procedure: LEFT FEMORAL- ABOVE KNEE POPLITEAL  BYPASS WITH 6 mm x 80 cm PROPATEN GRAFT;  Surgeon: Gretta Lonni PARAS, MD;  Location: [facility] OR;  Service: Vascular;  Laterality: Left;  INSERT ARTERIAL LINE   ILIAC VEIN ANGIOPLASTY / STENTING Right 08/09/2015   INCISION AND DRAINAGE OF WOUND Left 01/08/2022   Procedure: IRRIGATION AND DEBRIDEMENT GROIN LEFT application of Myriad Morcells.;  Surgeon: Gretta Lonni PARAS, MD;  Location: [facility] OR;  Service: Vascular;  Laterality: Left;   INSERTION OF ILIAC STENT  12/09/2021   Procedure: INSERTION OF LEFT COMMON AND EXTERNAL  ILIAC STENTS WITH ANGIOPLASTY;  Surgeon: Gretta Lonni PARAS, MD;  Location: [facility] OR;  Service: Vascular;;   LOWER EXTREMITY ANGIOGRAM Left 12/09/2021   Procedure: LEFT ILIAC AND LOWER EXTREMITY ANGIOGRAM;  Surgeon: Gretta Lonni PARAS, MD;  Location: [facility] OR;  Service: Vascular;  Laterality: Left;   LOWER EXTREMITY ANGIOGRAPHY Bilateral 11/03/2021   Procedure: Lower Extremity Angiography;  Surgeon: Court Dorn PARAS, MD;  Location: [facility] INVASIVE CV LAB;  Service: Cardiovascular;  Laterality: Bilateral;  Limited Study   PERIPHERAL VASCULAR CATHETERIZATION Bilateral 06/21/2015   Procedure: Lower Extremity Angiography;  Surgeon: Dorn PARAS Court, MD;  Location: [facility] INVASIVE CV LAB;  Service: Cardiovascular;  Laterality: Bilateral;   PERIPHERAL VASCULAR CATHETERIZATION N/A 06/21/2015   Procedure: Abdominal Aortogram;  Surgeon: Dorn PARAS Court, MD;  Location: [facility] INVASIVE CV LAB;  Service: Cardiovascular;  Laterality: N/A;   PERIPHERAL VASCULAR CATHETERIZATION N/A 07/26/2015   Procedure: Lower Extremity Angiography;  Surgeon: Dorn PARAS Court, MD;  Location: [facility] INVASIVE CV LAB;   Service: Cardiovascular;  Laterality: N/A;   PERIPHERAL VASCULAR CATHETERIZATION N/A 08/09/2015   Procedure: Lower Extremity Angiography;  Surgeon: Dorn PARAS Court, MD;  Location: [facility] INVASIVE CV LAB;  Service: Cardiovascular;  Laterality: N/A;   PERIPHERAL VASCULAR CATHETERIZATION  08/09/2015   Procedure: Peripheral Vascular Intervention;  Surgeon: Dorn PARAS Court, MD;  Location: [facility] INVASIVE CV LAB;  Service: Cardiovascular;;  rt ext. iliac atherectomy and stent   PERIPHERAL VASCULAR CATHETERIZATION N/A 09/20/2015   Procedure: Lower Extremity Angiography;  Surgeon: Dorn PARAS Court, MD;  Location: [facility] INVASIVE CV LAB;  Service: Cardiovascular;  Laterality: N/A;   PERIPHERAL VASCULAR CATHETERIZATION Right 09/20/2015   Procedure: Peripheral Vascular Intervention;  Surgeon: Dorn PARAS Court, MD;  Location: [facility] INVASIVE CV LAB;  Service: Cardiovascular;  Laterality: Right;  SFA   ROTATOR CUFF REPAIR Left 7/15   Dr Rubie   WOUND EXPLORATION Left 12/17/2021   Procedure: LEFT LEG WOUND EXPLORATION AND WASH OUT WITH MYRIAD MORCELLS;  Surgeon: Lanis Fonda BRAVO, MD;  Location: [facility] OR;  Service: Vascular;  Laterality: Left;   Family History Family History  Problem Relation Age of Onset   Lung cancer Mother    Diabetes Mother    Hypertension Father        And siblings   Heart disease Father        And second-degree relatives   Transient ischemic attack Father    Brain cancer Sister    Heart disease Sister    Heart disease Sister    Thyroid disease Sister    Diabetes Brother    Atrial fibrillation Brother    Ulcers Son    Diabetes Son    Diabetes Son    Colon cancer Neg Hx    Stomach cancer Neg Hx    Rectal cancer Neg Hx     Social History Social History   Tobacco Use   Smoking status: Former    Current packs/day: 0.00    Average  packs/day: 1 pack/day for 30.0 years (30.0 ttl pk-yrs)    Types: Cigarettes    Start date: 05/16/1960    Quit date: 05/16/1990    Years since quitting: 33.1     Passive exposure: Never   Smokeless tobacco: Never   Tobacco comments:    Quit in 1993  Vaping Use   Vaping status: Never Used  Substance Use Topics   Alcohol use: No    Alcohol/week: 0.0 standard drinks of alcohol   Drug use: No   Allergies Cardizem [diltiazem hcl]  Review of Systems Review of Systems  Constitutional:  Negative for chills and fever.  Respiratory:  Positive for cough.   Neurological:  Positive for weakness.  Psychiatric/Behavioral:  Positive for hallucinations.     Physical Exam Vital Signs  I have reviewed the triage vital signs BP 115/77   Pulse 88   Temp 98.1 F (36.7 C) (Oral)   Resp 20   Ht 5' 7 (1.702 m)   Wt 78.5 kg   SpO2 95%   BMI 27.10 kg/m  Physical Exam Vitals and nursing note reviewed.  Constitutional:      General: He is not in acute distress.    Appearance: He is not ill-appearing.  HENT:     Head: Normocephalic and atraumatic.     Right Ear: External ear normal.     Left Ear: External ear normal.     Mouth/Throat:     Mouth: Mucous membranes are dry.  Eyes:     General: No scleral icterus.    Conjunctiva/sclera: Conjunctivae normal.     Pupils: Pupils are equal, round, and reactive to light.  Cardiovascular:     Rate and Rhythm: Normal rate.     Pulses: Normal pulses.     Heart sounds: Normal heart sounds.  Pulmonary:     Effort: Pulmonary effort is normal.     Breath sounds: Decreased breath sounds present.     Comments: Coarse lower b/l Abdominal:     General: Abdomen is flat. There is no distension.     Palpations: Abdomen is soft.  Musculoskeletal:     Cervical back: No rigidity.     Right lower leg: No edema.     Left lower leg: No edema.  Skin:    General: Skin is warm.     Capillary Refill: Capillary refill takes less than 2 seconds.     Coloration: Skin is not jaundiced.  Neurological:     General: No focal deficit present.     Mental Status: He is alert.     ED Results and Treatments Labs (all labs  ordered are listed, but only abnormal results are displayed) Labs Reviewed  CBC WITH DIFFERENTIAL/PLATELET - Abnormal; Notable for the following components:      Result Value   RBC 2.13 (*)    Hemoglobin 7.5 (*)    HCT 22.6 (*)    MCV 106.1 (*)    MCH 35.2 (*)    RDW 21.1 (*)    Platelets 62 (*)    All other components within normal limits  COMPREHENSIVE METABOLIC PANEL - Abnormal; Notable for the following components:   Sodium 129 (*)    CO2 20 (*)    Glucose, Bld 105 (*)    Albumin  2.5 (*)    AST 144 (*)    ALT 108 (*)    Alkaline Phosphatase 140 (*)    All other components within normal limits  URINALYSIS, ROUTINE W REFLEX MICROSCOPIC -  Abnormal; Notable for the following components:   Protein, ur 30 (*)    All other components within normal limits  TROPONIN I (HIGH SENSITIVITY) - Abnormal; Notable for the following components:   Troponin I (High Sensitivity) 35 (*)    All other components within normal limits  TROPONIN I (HIGH SENSITIVITY) - Abnormal; Notable for the following components:   Troponin I (High Sensitivity) 37 (*)    All other components within normal limits  RESP PANEL BY RT-PCR (RSV, FLU A&B, COVID)  RVPGX2  LIPASE, BLOOD  AMMONIA                                                                                                                          Radiology CT Chest W Contrast Result Date: 06/17/2023 CLINICAL DATA:  Pneumonia, complication suspected, xray done possible right sided pneumonia, persistent x1 month EXAM: CT CHEST WITH CONTRAST TECHNIQUE: Multidetector CT imaging of the chest was performed during intravenous contrast administration. RADIATION DOSE REDUCTION: This exam was performed according to the departmental dose-optimization program which includes automated exposure control, adjustment of the mA and/or kV according to patient size and/or use of iterative reconstruction technique. CONTRAST:  75mL OMNIPAQUE  IOHEXOL  300 MG/ML  SOLN COMPARISON:   06/17/2023, 05/21/2023 FINDINGS: Cardiovascular: The heart is unremarkable without pericardial effusion. 4 cm ascending thoracic aortic aneurysm. No evidence of dissection. Atherosclerosis of the aorta and coronary vasculature. Mediastinum/Nodes: Persistent mediastinal adenopathy, with largest lymph node in the right paratracheal region measuring 2 cm in short axis. Thyroid, trachea, and esophagus are unremarkable. Lungs/Pleura: Moderate right pleural effusion, increased since prior exam. Pleural nodularity within the right anterior costophrenic angle concerning for pleural based metastases. There is progressive dense consolidation within the right lower lobe. The appearance is somewhat masslike, and underlying neoplasm is suspected. Multiple bilateral pulmonary nodules are again identified, not appreciably changed and suspicious for metastatic disease. Index nodules are as follows: Right middle lobe, 9 mm, image 95/3. Left lower lobe, 9 mm, image 101/3 Left upper lobe, 8 mm, image 40/3. No pneumothorax. Upper Abdomen: Soft tissue nodularity within the lower anterior mediastinum anterior to the right hemidiaphragm measures up to 1.9 cm, reference image 123/2, suspicious for metastatic disease. Small calcified gallstones are identified without cholecystitis. No other acute upper abdominal findings. Musculoskeletal: There is a lytic lesion within the right anterolateral aspect of the T8 vertebral body, reference image 85/2, new since prior study and concerning for bony metastasis. No other destructive bony abnormalities or acute fractures. Reconstructed images demonstrate no additional findings. IMPRESSION: 1. Progressive dense masslike consolidation within the right lower lobe, concerning for underlying neoplasm. Further evaluation with PET scan may be useful if the patient would be a therapy candidate should neoplasm be detected. 2. Numerous bilateral pulmonary nodules, concerning for metastatic disease. 3.  Progressive mediastinal lymphadenopathy, consistent with nodal metastases. 4. Enlarging right pleural effusion, with soft tissue nodularity along the right anterior costophrenic angle concerning for pleural based metastases. 5. New lytic lesion  within the right anterolateral aspect of the T8 vertebral body, concerning for bony metastasis. 6. 4 cm ascending thoracic aortic aneurysm. Recommend annual imaging followup by CTA or MRA. This recommendation follows 2010 ACCF/AHA/AATS/ACR/ASA/SCA/SCAI/SIR/STS/SVM Guidelines for the Diagnosis and Management of Patients with Thoracic Aortic Disease. Circulation. 2010; 121: Z733-z630. Aortic aneurysm NOS (ICD10-I71.9) 7.  Aortic Atherosclerosis (ICD10-I70.0). Electronically Signed   By: Ozell Daring M.D.   On: 06/17/2023 23:42   DG Chest 2 View Result Date: 06/17/2023 CLINICAL DATA:  cough EXAM: CHEST - 2 VIEW COMPARISON:  Chest x-ray 05/19/2023 FINDINGS: The heart and mediastinal contours are unchanged. Atherosclerotic plaque. Right mid to lower lung zone airspace opacity. No pulmonary edema. Interval development of a moderate volume right pleural effusion. Pleural effusion. No pneumothorax. No acute osseous abnormality. IMPRESSION: 1. Interval development of a moderate volume right pleural effusion. 2. Right mid to lower lung zone airspace opacity. 3.  Aortic Atherosclerosis (ICD10-I70.0). Electronically Signed   By: Morgane  Naveau M.D.   On: 06/17/2023 21:39   CT Head Wo Contrast Result Date: 06/17/2023 CLINICAL DATA:  Delirium Pt had worsening weakness past 2 days where he needed assistance getting off the toilet. Pt fell 1/30. Reports decreased appetite and started on megestro EXAM: CT HEAD WITHOUT CONTRAST TECHNIQUE: Contiguous axial images were obtained from the base of the skull through the vertex without intravenous contrast. RADIATION DOSE REDUCTION: This exam was performed according to the departmental dose-optimization program which includes automated  exposure control, adjustment of the mA and/or kV according to patient size and/or use of iterative reconstruction technique. COMPARISON:  None Available. FINDINGS: Brain: Cerebral ventricle sizes are concordant with the degree of cerebral volume loss. Patchy and confluent areas of decreased attenuation are noted throughout the deep and periventricular white matter of the cerebral hemispheres bilaterally, compatible with chronic microvascular ischemic disease. Age-indeterminate right cerebellar loss of Danielys Madry-white matter differentiation. No evidence of large-territorial acute infarction. No parenchymal hemorrhage. No mass lesion. No extra-axial collection. No mass effect or midline shift. No hydrocephalus. Basilar cisterns are patent. Vascular: No hyperdense vessel. Atherosclerotic calcifications are present within the cavernous internal carotid and vertebral arteries. Skull: No acute fracture or focal lesion. Sinuses/Orbits: Paranasal sinuses and mastoid air cells are clear. The orbits are unremarkable. Other: None. IMPRESSION: 1. 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 2. No acute intracranial hemorrhage. These results were called by telephone at the time of interpretation on 06/17/2023 at 9:33 pm to provider JAYSON PEREYRA , who verbally acknowledged these results. Electronically Signed   By: Morgane  Naveau M.D.   On: 06/17/2023 21:33    Pertinent labs & imaging results that were available during my care of the patient were reviewed by me and considered in my medical decision making (see MDM for details).  Medications Ordered in ED Medications  sodium chloride  0.9 % bolus 1,000 mL (1,000 mLs Intravenous New Bag/Given 06/17/23 2120)  iohexol  (OMNIPAQUE ) 300 MG/ML solution 75 mL (75 mLs Intravenous Contrast Given 06/17/23 2256)  Procedures .Critical Care  Performed by: Elnor Jayson LABOR, DO Authorized by: Elnor Jayson LABOR, DO   Critical care provider statement:    Critical care time (minutes):  33   Critical care time was exclusive of:  Separately billable procedures and treating other patients   Critical care was necessary to treat or prevent imminent or life-threatening deterioration of the following conditions:  Cardiac failure   Critical care was time spent personally by me on the following activities:  Development of treatment plan with patient or surrogate, discussions with consultants, evaluation of patient's response to treatment, examination of patient, ordering and review of laboratory studies, ordering and review of radiographic studies, ordering and performing treatments and interventions, pulse oximetry, re-evaluation of patient's condition, review of old charts and obtaining history from patient or surrogate   (including critical care time)  Medical Decision Making / ED Course    Medical Decision Making:    Aaron Sexton is a 78 y.o. male with past medical history as below, significant for ASVD, GERD, HLD, HTN, PVD, MDS f/w Dr Ivana who presents to the ED with complaint of weakness. . The complaint involves an extensive differential diagnosis and also carries with it a high risk of complications and morbidity.  Serious etiology was considered. Ddx includes but is not limited to: Differential diagnoses for altered mental status includes but is not exclusive to alcohol, illicit or prescription medications, intracranial pathology such as stroke, intracerebral hemorrhage, fever or infectious causes including sepsis, hypoxemia, uremia, trauma, endocrine related disorders such as diabetes, hypoglycemia, thyroid-related diseases, etc.   Complete initial physical exam performed, notably the patient was in no distress, no hypoxia, neuro non-focal.    Reviewed and confirmed nursing documentation for past medical history, family history, social history.  Vital  signs reviewed.     Clinical Course as of 06/17/23 2348  [place] Jun 17, 2023  2006 Hemoglobin(!): 7.5 Similar to prior [SG]  2006 Sodium(!): 129 Similar to prior [SG]  2006 LFT's elev, similar to prior  [SG]  2130 He was resumed on REVLIMID  on 2/5 and started on megestrol  at that time as well. Possible adverse effect  [SG]  2159 Troponin I (High Sensitivity)(!): 37 Trop 35 > 37, no chest pain, EKG w/o acute ischemic changes.  [SG]  2242 CXR w/ PNA, he was admitted around 1 month ago w/ right sided pna. Will get CT chest to confirm. CTH with possible cerebellar infarct. Would likely benefit from MRI [SG]    Clinical Course User Index [SG] Elnor Jayson LABOR, DO    Brief summary: 78 yo male, hx as above including MDS here with AMS, weakness, poor PO. Reduced po chronically, worse in the past 2 to 3 days.  Per family difficulty getting up from chair getting out of bed the past 2 to 3 days.  Labs reviewed, troponin mildly elevated, sodium is also mildly reduced.  Hemoglobin similar to baseline.  Liver enzymes similar to prior.  Chest x-ray with possible persistent ammonia, he was admitted around month ago with right side pneumonia, will get CT chest.  CT head with possible cerebral infarct, recommend MRI brain.  Recommend admission given persistent pneumonia, possible CVA.  Patient agreeable.    Handoff to incoming EDP pending CT / admission                  Additional history obtained: -Additional history obtained from family -External records from outside source obtained and reviewed including: Chart review including previous notes,  labs, imaging, consultation notes including  Oncology documentation, prior labs and imaging, medications   Lab Tests: -I ordered, reviewed, and interpreted labs.   The pertinent results include:   Labs Reviewed  CBC WITH DIFFERENTIAL/PLATELET - Abnormal; Notable for the following components:      Result Value   RBC 2.13 (*)    Hemoglobin 7.5  (*)    HCT 22.6 (*)    MCV 106.1 (*)    MCH 35.2 (*)    RDW 21.1 (*)    Platelets 62 (*)    All other components within normal limits  COMPREHENSIVE METABOLIC PANEL - Abnormal; Notable for the following components:   Sodium 129 (*)    CO2 20 (*)    Glucose, Bld 105 (*)    Albumin  2.5 (*)    AST 144 (*)    ALT 108 (*)    Alkaline Phosphatase 140 (*)    All other components within normal limits  URINALYSIS, ROUTINE W REFLEX MICROSCOPIC - Abnormal; Notable for the following components:   Protein, ur 30 (*)    All other components within normal limits  TROPONIN I (HIGH SENSITIVITY) - Abnormal; Notable for the following components:   Troponin I (High Sensitivity) 35 (*)    All other components within normal limits  TROPONIN I (HIGH SENSITIVITY) - Abnormal; Notable for the following components:   Troponin I (High Sensitivity) 37 (*)    All other components within normal limits  RESP PANEL BY RT-PCR (RSV, FLU A&B, COVID)  [id number]  LIPASE, BLOOD  AMMONIA    Notable for mild troponin elevation, as above  EKG   EKG Interpretation Date/Time:    Ventricular Rate:    PR Interval:    QRS Duration:    QT Interval:    QTC Calculation:   R Axis:      Text Interpretation:           Imaging Studies ordered: I ordered imaging studies including CT head, chest x-ray, CT chest I independently visualized the following imaging with scope of interpretation limited to determining acute life threatening conditions related to emergency care; findings noted above I independently visualized and interpreted imaging. I agree with the radiologist interpretation   Medicines ordered and prescription drug management: Meds ordered this encounter  Medications   sodium chloride  0.9 % bolus 1,000 mL   iohexol  (OMNIPAQUE ) 300 MG/ML solution 75 mL    -I have reviewed the patients home medicines and have made adjustments as needed   Consultations Obtained: na   Cardiac Monitoring: Continuous  pulse oximetry interpreted by myself, 96% on RA.    Social Determinants of Health:  Diagnosis or treatment significantly limited by social determinants of health: former smoker   Reevaluation: After the interventions noted above, I reevaluated the patient and found that they have stayed the same  Co morbidities that complicate the patient evaluation  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   Cerebrovascular disease    Moderate ASVD without focal stenosis in 01/2007   Clotting disorder (HCC)    Colon polyps 07/2010   single 2mm polyp--tubular adenoma   Diverticulosis 2012   found on colonoscopy   Erectile dysfunction    Family history of adverse reaction to anesthesia    difficult for son & sistor to wake    GERD (gastroesophageal reflux disease)    Hematochezia 05/24/2010  Hyperlipidemia    Hypertension    Osteoarthritis    knees/hands-Dr Lucey   Overweight(278.02)    Peripheral vascular disease (HCC) 03/11/2010   Moderate SFA stenosis; history of claudication   Pneumonia    Rosacea    Vitamin B12 deficiency       Dispostion: Disposition decision including need for hospitalization was considered, and patient disposition pending at time of sign out.    Final Clinical Impression(s) / ED Diagnoses Final diagnoses:  Weakness        Elnor Jayson LABOR, DO 06/17/23 2348    Elnor Jayson LABOR, DO 06/27/23 2053

## 2023-06-17 NOTE — ED Triage Notes (Signed)
 Pt given blood tx Thursday. Pt had worsening weakness past 2 days where he needed assistance getting off the toilet. Pt fell 1/30. Reports decreased appetite and started on megestrol .

## 2023-06-17 NOTE — ED Provider Notes (Signed)
  Provider Note MRN:  [id number]  Arrival date & time: 06/18/23    ED Course and Medical Decision Making  Assumed care of patient at sign-out or upon transfer.  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  Also report of intermittent hallucinations.  Workup revealing persistent pleural effusion, mild troponin and LFT elevation, age indeterminant cerebellar infarct.  Plan is for admission.  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  These findings discussed with patient and patient's wife, admitted to medicine.  Procedures  Final Clinical Impressions(s) / ED Diagnoses     ICD-10-CM   1. Weakness  R53.1       ED Discharge Orders     None       Discharge Instructions   None     Aaron Sexton. Theadore, MD [facility] Emergency Medicine [facility] [email or web address]    Theadore Aaron HERO, MD 06/18/23 [phone]

## 2023-06-18 ENCOUNTER — Inpatient Hospital Stay (HOSPITAL_COMMUNITY): Payer: Medicare PPO

## 2023-06-18 ENCOUNTER — Encounter (HOSPITAL_COMMUNITY): Payer: Self-pay | Admitting: Internal Medicine

## 2023-06-18 ENCOUNTER — Other Ambulatory Visit: Payer: Self-pay

## 2023-06-18 DIAGNOSIS — N1831 Chronic kidney disease, stage 3a: Secondary | ICD-10-CM | POA: Diagnosis present

## 2023-06-18 DIAGNOSIS — R7401 Elevation of levels of liver transaminase levels: Secondary | ICD-10-CM | POA: Diagnosis not present

## 2023-06-18 DIAGNOSIS — I21A1 Myocardial infarction type 2: Secondary | ICD-10-CM | POA: Diagnosis present

## 2023-06-18 DIAGNOSIS — I10 Essential (primary) hypertension: Secondary | ICD-10-CM | POA: Diagnosis not present

## 2023-06-18 DIAGNOSIS — I129 Hypertensive chronic kidney disease with stage 1 through stage 4 chronic kidney disease, or unspecified chronic kidney disease: Secondary | ICD-10-CM | POA: Diagnosis present

## 2023-06-18 DIAGNOSIS — Z7902 Long term (current) use of antithrombotics/antiplatelets: Secondary | ICD-10-CM | POA: Diagnosis not present

## 2023-06-18 DIAGNOSIS — D469 Myelodysplastic syndrome, unspecified: Secondary | ICD-10-CM

## 2023-06-18 DIAGNOSIS — E782 Mixed hyperlipidemia: Secondary | ICD-10-CM | POA: Diagnosis present

## 2023-06-18 DIAGNOSIS — Z48813 Encounter for surgical aftercare following surgery on the respiratory system: Secondary | ICD-10-CM | POA: Diagnosis not present

## 2023-06-18 DIAGNOSIS — C799 Secondary malignant neoplasm of unspecified site: Secondary | ICD-10-CM | POA: Diagnosis not present

## 2023-06-18 DIAGNOSIS — Z833 Family history of diabetes mellitus: Secondary | ICD-10-CM | POA: Diagnosis not present

## 2023-06-18 DIAGNOSIS — D539 Nutritional anemia, unspecified: Secondary | ICD-10-CM | POA: Diagnosis present

## 2023-06-18 DIAGNOSIS — J90 Pleural effusion, not elsewhere classified: Secondary | ICD-10-CM | POA: Insufficient documentation

## 2023-06-18 DIAGNOSIS — Z1152 Encounter for screening for COVID-19: Secondary | ICD-10-CM | POA: Diagnosis not present

## 2023-06-18 DIAGNOSIS — R918 Other nonspecific abnormal finding of lung field: Secondary | ICD-10-CM | POA: Diagnosis not present

## 2023-06-18 DIAGNOSIS — Z515 Encounter for palliative care: Secondary | ICD-10-CM | POA: Diagnosis not present

## 2023-06-18 DIAGNOSIS — Z8249 Family history of ischemic heart disease and other diseases of the circulatory system: Secondary | ICD-10-CM | POA: Diagnosis not present

## 2023-06-18 DIAGNOSIS — Z7982 Long term (current) use of aspirin: Secondary | ICD-10-CM | POA: Diagnosis not present

## 2023-06-18 DIAGNOSIS — D696 Thrombocytopenia, unspecified: Secondary | ICD-10-CM | POA: Diagnosis present

## 2023-06-18 DIAGNOSIS — C7951 Secondary malignant neoplasm of bone: Secondary | ICD-10-CM | POA: Diagnosis present

## 2023-06-18 DIAGNOSIS — J811 Chronic pulmonary edema: Secondary | ICD-10-CM | POA: Diagnosis not present

## 2023-06-18 DIAGNOSIS — D509 Iron deficiency anemia, unspecified: Secondary | ICD-10-CM | POA: Diagnosis present

## 2023-06-18 DIAGNOSIS — C3431 Malignant neoplasm of lower lobe, right bronchus or lung: Secondary | ICD-10-CM | POA: Diagnosis present

## 2023-06-18 DIAGNOSIS — R627 Adult failure to thrive: Secondary | ICD-10-CM | POA: Diagnosis present

## 2023-06-18 DIAGNOSIS — J910 Malignant pleural effusion: Secondary | ICD-10-CM | POA: Diagnosis present

## 2023-06-18 DIAGNOSIS — E8809 Other disorders of plasma-protein metabolism, not elsewhere classified: Secondary | ICD-10-CM | POA: Diagnosis present

## 2023-06-18 DIAGNOSIS — R531 Weakness: Secondary | ICD-10-CM | POA: Diagnosis present

## 2023-06-18 DIAGNOSIS — E43 Unspecified severe protein-calorie malnutrition: Secondary | ICD-10-CM | POA: Diagnosis present

## 2023-06-18 DIAGNOSIS — I2510 Atherosclerotic heart disease of native coronary artery without angina pectoris: Secondary | ICD-10-CM | POA: Diagnosis present

## 2023-06-18 DIAGNOSIS — E871 Hypo-osmolality and hyponatremia: Secondary | ICD-10-CM

## 2023-06-18 DIAGNOSIS — C771 Secondary and unspecified malignant neoplasm of intrathoracic lymph nodes: Secondary | ICD-10-CM | POA: Diagnosis present

## 2023-06-18 DIAGNOSIS — I517 Cardiomegaly: Secondary | ICD-10-CM | POA: Diagnosis not present

## 2023-06-18 DIAGNOSIS — Z95820 Peripheral vascular angioplasty status with implants and grafts: Secondary | ICD-10-CM | POA: Diagnosis not present

## 2023-06-18 LAB — TROPONIN I (HIGH SENSITIVITY)
Troponin I (High Sensitivity): 28 ng/L — ABNORMAL HIGH
Troponin I (High Sensitivity): 34 ng/L — ABNORMAL HIGH (ref <18)

## 2023-06-18 LAB — CBC
HCT: 21 % — ABNORMAL LOW (ref 39.0–52.0)
Hemoglobin: 7 g/dL — ABNORMAL LOW (ref 13.0–17.0)
MCH: 35.9 pg — ABNORMAL HIGH (ref 26.0–34.0)
MCHC: 33.3 g/dL (ref 30.0–36.0)
MCV: 107.7 fL — ABNORMAL HIGH (ref 80.0–100.0)
Platelets: 57 10*3/uL — ABNORMAL LOW (ref 150–400)
RBC: 1.95 MIL/uL — ABNORMAL LOW (ref 4.22–5.81)
RDW: 21 % — ABNORMAL HIGH (ref 11.5–15.5)
WBC: 3.7 10*3/uL — ABNORMAL LOW (ref 4.0–10.5)
nRBC: 0 % (ref 0.0–0.2)

## 2023-06-18 LAB — ALBUMIN, PLEURAL OR PERITONEAL FLUID: Albumin, Fluid: 2.1 g/dL

## 2023-06-18 LAB — HEPATITIS PANEL, ACUTE
HCV Ab: NONREACTIVE
Hep A IgM: NONREACTIVE
Hep B C IgM: NONREACTIVE
Hepatitis B Surface Ag: NONREACTIVE

## 2023-06-18 LAB — BODY FLUID CELL COUNT WITH DIFFERENTIAL
Eos, Fluid: 0 %
Lymphs, Fluid: 43 %
Monocyte-Macrophage-Serous Fluid: 20 % — ABNORMAL LOW (ref 50–90)
Neutrophil Count, Fluid: 37 % — ABNORMAL HIGH (ref 0–25)
Total Nucleated Cell Count, Fluid: 393 uL (ref 0–1000)

## 2023-06-18 LAB — PROTEIN, PLEURAL OR PERITONEAL FLUID: Total protein, fluid: 4.7 g/dL

## 2023-06-18 LAB — COMPREHENSIVE METABOLIC PANEL
ALT: 85 U/L — ABNORMAL HIGH (ref 0–44)
AST: 85 U/L — ABNORMAL HIGH (ref 15–41)
Albumin: 2.4 g/dL — ABNORMAL LOW (ref 3.5–5.0)
Alkaline Phosphatase: 123 U/L (ref 38–126)
Anion gap: 7 (ref 5–15)
BUN: 17 mg/dL (ref 8–23)
CO2: 20 mmol/L — ABNORMAL LOW (ref 22–32)
Calcium: 9.7 mg/dL (ref 8.9–10.3)
Chloride: 101 mmol/L (ref 98–111)
Creatinine, Ser: 0.95 mg/dL (ref 0.61–1.24)
GFR, Estimated: >60 mL/min (ref >60)
Glucose, Bld: 85 mg/dL (ref 70–99)
Potassium: 3.8 mmol/L (ref 3.5–5.1)
Sodium: 128 mmol/L — ABNORMAL LOW (ref 135–145)
Total Bilirubin: 0.6 mg/dL (ref 0.0–1.2)
Total Protein: 6.3 g/dL — ABNORMAL LOW (ref 6.5–8.1)

## 2023-06-18 LAB — VITAMIN B12: Vitamin B-12: 291 pg/mL (ref 180–914)

## 2023-06-18 LAB — GLUCOSE, PLEURAL OR PERITONEAL FLUID: Glucose, Fluid: 83 mg/dL

## 2023-06-18 LAB — OSMOLALITY, URINE: Osmolality, Ur: 686 mosm/kg (ref 300–900)

## 2023-06-18 LAB — FOLATE: Folate: 5.2 ng/mL — ABNORMAL LOW (ref >5.9)

## 2023-06-18 LAB — OSMOLALITY: Osmolality: 282 mosm/kg (ref 275–295)

## 2023-06-18 LAB — SODIUM, URINE, RANDOM: Sodium, Ur: 76 mmol/L

## 2023-06-18 MED ORDER — ACETAMINOPHEN 325 MG PO TABS: 650.0000 mg | ORAL_TABLET | Freq: Four times a day (QID) | ORAL | Status: DC | PRN

## 2023-06-18 MED ORDER — ACETAMINOPHEN 650 MG RE SUPP: 650.0000 mg | Freq: Four times a day (QID) | RECTAL | Status: DC | PRN

## 2023-06-18 MED ORDER — ENSURE ENLIVE PO LIQD
237.0000 mL | Freq: Two times a day (BID) | ORAL | Status: DC
  Administered 2023-06-18 – 2023-06-19 (×2): 237 mL via ORAL

## 2023-06-18 MED ORDER — ONDANSETRON HCL 4 MG/2ML IJ SOLN: 4.0000 mg | Freq: Four times a day (QID) | INTRAMUSCULAR | Status: DC | PRN

## 2023-06-18 MED ORDER — MEGESTROL ACETATE 400 MG/10ML PO SUSP
400.0000 mg | Freq: Two times a day (BID) | ORAL | Status: DC
  Administered 2023-06-18 – 2023-06-20 (×6): 400 mg via ORAL
  Filled 2023-06-18 (×6): qty 10

## 2023-06-18 MED ORDER — LOSARTAN POTASSIUM 25 MG PO TABS
25.0000 mg | ORAL_TABLET | Freq: Every day | ORAL | Status: DC
  Administered 2023-06-18 – 2023-06-20 (×3): 25 mg via ORAL
  Filled 2023-06-18 (×3): qty 1

## 2023-06-18 MED ORDER — PANTOPRAZOLE SODIUM 40 MG PO TBEC
40.0000 mg | DELAYED_RELEASE_TABLET | Freq: Every evening | ORAL | Status: DC
  Administered 2023-06-18 – 2023-06-20 (×3): 40 mg via ORAL
  Filled 2023-06-18 (×3): qty 1

## 2023-06-18 MED ORDER — ONDANSETRON HCL 4 MG PO TABS: 4.0000 mg | ORAL_TABLET | Freq: Four times a day (QID) | ORAL | Status: DC | PRN

## 2023-06-18 MED ORDER — LIDOCAINE HCL (PF) 2 % IJ SOLN
10.0000 mL | Freq: Once | INTRAMUSCULAR | Status: AC
  Administered 2023-06-18: 10 mL
  Filled 2023-06-18: qty 10

## 2023-06-18 NOTE — Procedures (Signed)
 Pre procedural Dx: Symptomatic pleural effusion Post procedural Dx: Same  Successful US  guided right sided thoracentesis yielding 1.4 L of serous pleural fluid.   Samples sent to lab for analysis.  EBL: None Complications: None immediate.  Josem Nick, MD Pager #: [phone]

## 2023-06-18 NOTE — Plan of Care (Signed)
  Problem: Acute Rehab OT Goals (only OT should resolve) Goal: Pt. Will Perform Grooming Flowsheets (Taken 06/18/2023 0919) Pt Will Perform Grooming:  with modified independence  standing Goal: Pt. Will Perform Lower Body Dressing Flowsheets (Taken 06/18/2023 0919) Pt Will Perform Lower Body Dressing:  with contact guard assist  sitting/lateral leans Goal: Pt. Will Transfer To Toilet Flowsheets (Taken 06/18/2023 0919) Pt Will Transfer to Toilet:  with modified independence  ambulating Goal: Pt. Will Perform Toileting-Clothing Manipulation Flowsheets (Taken 06/18/2023 0919) Pt Will Perform Toileting - Clothing Manipulation and hygiene: with modified independence Goal: Pt/Caregiver Will Perform Home Exercise Program Flowsheets (Taken 06/18/2023 0919) Pt/caregiver will Perform Home Exercise Program:  Increased strength  Both right and left upper extremity  Independently  Kerigan Narvaez OT, MOT

## 2023-06-18 NOTE — ED Notes (Signed)
 ED TO INPATIENT HANDOFF REPORT  ED Nurse Name and Phone #: [phone]  S Name/Age/Gender Aaron Sexton 78 y.o. male Room/Bed: APA09/APA09  Code Status   Code Status: Prior  Home/SNF/Other Home Patient oriented to: self and place Is this baseline? Yes   Triage Complete: Triage complete  Chief Complaint Generalized weakness [R53.1]  Triage Note Pt given blood tx Thursday. Pt had worsening weakness past 2 days where he needed assistance getting off the toilet. Pt fell 1/30. Reports decreased appetite and started on megestrol .    Allergies Allergies  Allergen Reactions   Cardizem [Diltiazem Hcl] Hives and Rash    Level of Care/Admitting Diagnosis ED Disposition     ED Disposition  Admit   Condition  --   Comment  Hospital Area: [facility] [[id number]]  Level of Care: Med-Surg [16]  Covid Evaluation: Asymptomatic - no recent exposure (last 10 days) testing not required  Diagnosis: Generalized weakness [454098]  Admitting Physician: ADEFESO, OLADAPO [[id number]]  Attending Physician: ADEFESO, OLADAPO [[id number]]  Certification:: I certify this patient will need inpatient services for at least 2 midnights  Expected Medical Readiness: 06/21/2023          B Medical/Surgery History 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   Cerebrovascular disease    Moderate ASVD without focal stenosis in 01/2007   Clotting disorder (HCC)    Colon polyps 07/2010   single 2mm polyp--tubular adenoma   Diverticulosis 2012   found on colonoscopy   Erectile dysfunction    Family history of adverse reaction to anesthesia    difficult for son & sistor to wake    GERD (gastroesophageal reflux disease)    Hematochezia 05/24/2010   Hyperlipidemia    Hypertension    Osteoarthritis    knees/hands-Dr Lucey   Overweight(278.02)    Peripheral vascular disease (HCC)  03/11/2010   Moderate SFA stenosis; history of claudication   Pneumonia    Rosacea    Vitamin B12 deficiency    Past Surgical History:  Procedure Laterality Date   APPENDECTOMY     APPLICATION OF WOUND VAC Left 12/17/2021   Procedure: APPLICATION OF PREVENA WOUND VAC LEFT GROIN;  Surgeon: Kayla Part, MD;  Location: [facility] OR;  Service: Vascular;  Laterality: Left;   APPLICATION OF WOUND VAC Left 01/08/2022   Procedure: APPLICATION OF WOUND VAC left groin.;  Surgeon: Young Hensen, MD;  Location: [facility] OR;  Service: Vascular;  Laterality: Left;   COLONOSCOPY W/ POLYPECTOMY  2012   ENDARTERECTOMY FEMORAL Left 12/09/2021   Procedure: LEFT COMMON FEMORAL ENDARTERECTOMY WITH 1 CM X 6 CM XENOSURE BOVINE PATCH ANGIOPLASTY;  Surgeon: Young Hensen, MD;  Location: [facility] OR;  Service: Vascular;  Laterality: Left;   FEMORAL-POPLITEAL BYPASS GRAFT Left 12/09/2021   Procedure: LEFT FEMORAL- ABOVE KNEE POPLITEAL BYPASS WITH 6 mm x 80 cm PROPATEN GRAFT;  Surgeon: Young Hensen, MD;  Location: [facility] OR;  Service: Vascular;  Laterality: Left;  INSERT ARTERIAL LINE   ILIAC VEIN ANGIOPLASTY / STENTING Right 08/09/2015   INCISION AND DRAINAGE OF WOUND Left 01/08/2022   Procedure: IRRIGATION AND DEBRIDEMENT GROIN LEFT application of Myriad Morcells.;  Surgeon: Young Hensen, MD;  Location: [facility] OR;  Service: Vascular;  Laterality: Left;   INSERTION OF ILIAC STENT  12/09/2021   Procedure: INSERTION OF LEFT COMMON AND EXTERNAL  ILIAC STENTS WITH ANGIOPLASTY;  Surgeon: Young Hensen, MD;  Location: [facility] OR;  Service: Vascular;;   LOWER EXTREMITY ANGIOGRAM Left 12/09/2021   Procedure: LEFT ILIAC AND LOWER EXTREMITY ANGIOGRAM;  Surgeon: Young Hensen, MD;  Location: [facility] OR;  Service: Vascular;  Laterality: Left;   LOWER EXTREMITY ANGIOGRAPHY Bilateral 11/03/2021   Procedure: Lower Extremity Angiography;  Surgeon: Avanell Leigh, MD;  Location: [facility] INVASIVE CV LAB;  Service: Cardiovascular;   Laterality: Bilateral;  Limited Study   PERIPHERAL VASCULAR CATHETERIZATION Bilateral 06/21/2015   Procedure: Lower Extremity Angiography;  Surgeon: Avanell Leigh, MD;  Location: [facility] INVASIVE CV LAB;  Service: Cardiovascular;  Laterality: Bilateral;   PERIPHERAL VASCULAR CATHETERIZATION N/A 06/21/2015   Procedure: Abdominal Aortogram;  Surgeon: Avanell Leigh, MD;  Location: [facility] INVASIVE CV LAB;  Service: Cardiovascular;  Laterality: N/A;   PERIPHERAL VASCULAR CATHETERIZATION N/A 07/26/2015   Procedure: Lower Extremity Angiography;  Surgeon: Avanell Leigh, MD;  Location: [facility] INVASIVE CV LAB;  Service: Cardiovascular;  Laterality: N/A;   PERIPHERAL VASCULAR CATHETERIZATION N/A 08/09/2015   Procedure: Lower Extremity Angiography;  Surgeon: Avanell Leigh, MD;  Location: [facility] INVASIVE CV LAB;  Service: Cardiovascular;  Laterality: N/A;   PERIPHERAL VASCULAR CATHETERIZATION  08/09/2015   Procedure: Peripheral Vascular Intervention;  Surgeon: Avanell Leigh, MD;  Location: [facility] INVASIVE CV LAB;  Service: Cardiovascular;;  rt ext. iliac atherectomy and stent   PERIPHERAL VASCULAR CATHETERIZATION N/A 09/20/2015   Procedure: Lower Extremity Angiography;  Surgeon: Avanell Leigh, MD;  Location: [facility] INVASIVE CV LAB;  Service: Cardiovascular;  Laterality: N/A;   PERIPHERAL VASCULAR CATHETERIZATION Right 09/20/2015   Procedure: Peripheral Vascular Intervention;  Surgeon: Avanell Leigh, MD;  Location: [facility] INVASIVE CV LAB;  Service: Cardiovascular;  Laterality: Right;  SFA   ROTATOR CUFF REPAIR Left 7/15   Dr Genevive Ket   WOUND EXPLORATION Left 12/17/2021   Procedure: LEFT LEG WOUND EXPLORATION AND WASH OUT WITH MYRIAD MORCELLS;  Surgeon: Kayla Part, MD;  Location: [facility] OR;  Service: Vascular;  Laterality: Left;     A IV Location/Drains/Wounds Patient Lines/Drains/Airways Status     Active Line/Drains/Airways     Name Placement date Placement time Site Days   Peripheral IV 06/17/23 20 G Posterior;Right  Hand 06/17/23  2120  Hand  1   Negative Pressure Wound Therapy Groin Anterior;Left;Proximal 01/08/22  0841  --  526   Wound / Incision (Open or Dehisced) 12/09/21 Non-pressure wound Toe (Comment  which one) Left;Medial Between 1&2nd toe 12/09/21  2000  Toe (Comment  which one)  556            Intake/Output Last 24 hours No intake or output data in the 24 hours ending 06/18/23 0051  Labs/Imaging Results for orders placed or performed during the hospital encounter of 06/17/23 (from the past 48 hours)  Urinalysis, Routine w reflex microscopic -Urine, Clean Catch     Status: Abnormal   Collection Time: 06/17/23  7:15 PM  Result Value Ref Range   Color, Urine YELLOW YELLOW   APPearance CLEAR CLEAR   Specific Gravity, Urine 1.019 1.005 - 1.030   pH 5.0 5.0 - 8.0   Glucose, UA NEGATIVE NEGATIVE mg/dL   Hgb urine dipstick NEGATIVE NEGATIVE   Bilirubin Urine NEGATIVE NEGATIVE   Ketones, ur NEGATIVE NEGATIVE mg/dL   Protein, ur 30 (A) NEGATIVE mg/dL   Nitrite NEGATIVE NEGATIVE   Leukocytes,Ua NEGATIVE NEGATIVE   RBC / HPF 0-5 0 - 5 RBC/hpf   WBC, UA 0-5 0 -  5 WBC/hpf   Bacteria, UA NONE SEEN NONE SEEN   Squamous Epithelial / HPF 0-5 0 - 5 /HPF   Mucus PRESENT     Comment: Performed at [facility], [address] [place], [place] [address]  Resp panel by RT-PCR (RSV, Flu A&B, Covid) Anterior Nasal Swab     Status: None   Collection Time: 06/17/23  7:26 PM   Specimen: Anterior Nasal Swab  Result Value Ref Range   SARS Coronavirus 2 by RT PCR NEGATIVE NEGATIVE    Comment: (NOTE) SARS-CoV-2 target nucleic acids are NOT DETECTED.  The SARS-CoV-2 RNA is generally detectable in upper respiratory specimens during the acute phase of infection. The lowest concentration of SARS-CoV-2 viral copies this assay can detect is 138 copies/mL. A negative result does not preclude SARS-Cov-2 infection and should not be used as the sole basis for treatment or other patient management decisions. A  negative result may occur with  improper specimen collection/handling, submission of specimen other than nasopharyngeal swab, presence of viral mutation(s) within the areas targeted by this assay, and inadequate number of viral copies(<138 copies/mL). A negative result must be combined with clinical observations, patient history, and epidemiological information. The expected result is Negative.  Fact Sheet for Patients:  BloggerCourse.com  Fact Sheet for Healthcare Providers:  SeriousBroker.it  This test is no t yet approved or cleared by the [place]  FDA and  has been authorized for detection and/or diagnosis of SARS-CoV-2 by FDA under an Emergency Use Authorization (EUA). This EUA will remain  in effect (meaning this test can be used) for the duration of the COVID-19 declaration under Section 564(b)(1) of the Act, 21 U.S.C.section 360bbb-3(b)(1), unless the authorization is terminated  or revoked sooner.       Influenza A by PCR NEGATIVE NEGATIVE   Influenza B by PCR NEGATIVE NEGATIVE    Comment: (NOTE) The Xpert Xpress SARS-CoV-2/FLU/RSV plus assay is intended as an aid in the diagnosis of influenza from Nasopharyngeal swab specimens and should not be used as a sole basis for treatment. Nasal washings and aspirates are unacceptable for Xpert Xpress SARS-CoV-2/FLU/RSV testing.  Fact Sheet for Patients: BloggerCourse.com  Fact Sheet for Healthcare Providers: SeriousBroker.it  This test is not yet approved or cleared by the [place]  FDA and has been authorized for detection and/or diagnosis of SARS-CoV-2 by FDA under an Emergency Use Authorization (EUA). This EUA will remain in effect (meaning this test can be used) for the duration of the COVID-19 declaration under Section 564(b)(1) of the Act, 21 U.S.C. section 360bbb-3(b)(1), unless the authorization is terminated  or revoked.     Resp Syncytial Virus by PCR NEGATIVE NEGATIVE    Comment: (NOTE) Fact Sheet for Patients: BloggerCourse.com  Fact Sheet for Healthcare Providers: SeriousBroker.it  This test is not yet approved or cleared by the [place]  FDA and has been authorized for detection and/or diagnosis of SARS-CoV-2 by FDA under an Emergency Use Authorization (EUA). This EUA will remain in effect (meaning this test can be used) for the duration of the COVID-19 declaration under Section 564(b)(1) of the Act, 21 U.S.C. section 360bbb-3(b)(1), unless the authorization is terminated or revoked.  Performed at [facility], [address] [place], [place] [address]   CBC with Differential     Status: Abnormal   Collection Time: 06/17/23  7:27 PM  Result Value Ref Range   WBC 4.3 4.0 - 10.5 K/uL   RBC 2.13 (L) 4.22 - 5.81 MIL/uL   Hemoglobin 7.5 (L)  13.0 - 17.0 g/dL   HCT 16.1 (L) 09.6 - 04.5 %   MCV 106.1 (H) 80.0 - 100.0 fL   MCH 35.2 (H) 26.0 - 34.0 pg   MCHC 33.2 30.0 - 36.0 g/dL   RDW 40.9 (H) 81.1 - 91.4 %   Platelets 62 (L) 150 - 400 K/uL    Comment: SPECIMEN CHECKED FOR CLOTS REPEATED TO VERIFY    nRBC 0.0 0.0 - 0.2 %   Neutrophils Relative % 65 %   Neutro Abs 2.8 1.7 - 7.7 K/uL   Lymphocytes Relative 26 %   Lymphs Abs 1.1 0.7 - 4.0 K/uL   Monocytes Relative 7 %   Monocytes Absolute 0.3 0.1 - 1.0 K/uL   Eosinophils Relative 1 %   Eosinophils Absolute 0.1 0.0 - 0.5 K/uL   Basophils Relative 0 %   Basophils Absolute 0.0 0.0 - 0.1 K/uL   Immature Granulocytes 1 %   Abs Immature Granulocytes 0.02 0.00 - 0.07 K/uL    Comment: Performed at [facility], [address], [place], [place] [address]  Comprehensive metabolic panel     Status: Abnormal   Collection Time: 06/17/23  7:27 PM  Result Value Ref Range   Sodium 129 (L) 135 - 145 mmol/L   Potassium 3.9 3.5 - 5.1 mmol/L   Chloride 99 98 - 111 mmol/L   CO2 20 (L) 22 -  32 mmol/L   Glucose, Bld 105 (H) 70 - 99 mg/dL    Comment: Glucose reference range applies only to samples taken after fasting for at least 8 hours.   BUN 19 8 - 23 mg/dL   Creatinine, Ser 6.21 0.61 - 1.24 mg/dL   Calcium 30.8 8.9 - 65.7 mg/dL   Total Protein 6.9 6.5 - 8.1 g/dL   Albumin  2.5 (L) 3.5 - 5.0 g/dL   AST 846 (H) 15 - 41 U/L   ALT 108 (H) 0 - 44 U/L   Alkaline Phosphatase 140 (H) 38 - 126 U/L   Total Bilirubin 0.7 0.0 - 1.2 mg/dL   GFR, Estimated >96 >29 mL/min    Comment: (NOTE) Calculated using the CKD-EPI Creatinine Equation (2021)    Anion gap 10 5 - 15    Comment: Performed at [facility], [address] [place], [place] [address]  Lipase, blood     Status: None   Collection Time: 06/17/23  7:27 PM  Result Value Ref Range   Lipase 39 11 - 51 U/L    Comment: Performed at [facility], [address] [place], [place] [address]  Troponin I (High Sensitivity)     Status: Abnormal   Collection Time: 06/17/23  7:27 PM  Result Value Ref Range   Troponin I (High Sensitivity) 35 (H) <18 ng/L    Comment: (NOTE) Elevated high sensitivity troponin I (hsTnI) values and significant  changes across serial measurements may suggest ACS but many other  chronic and acute conditions are known to elevate hsTnI results.  Refer to the "Links" section for chest pain algorithms and additional  guidance. Performed at [facility], [address] [place], [place] [address]   Ammonia     Status: None   Collection Time: 06/17/23  8:52 PM  Result Value Ref Range   Ammonia <10 9 - 35 umol/L    Comment: Performed at [facility], [address] [place], [place] [address]  Troponin I (High Sensitivity)     Status: Abnormal   Collection Time: 06/17/23  9:03 PM  Result  Value Ref Range   Troponin I (High Sensitivity) 37 (H) <18 ng/L    Comment: (NOTE) Elevated high sensitivity troponin I (hsTnI) values and significant  changes across serial measurements may suggest ACS but many other   chronic and acute conditions are known to elevate hsTnI results.  Refer to the "Links" section for chest pain algorithms and additional  guidance. Performed at [facility], [address] [place], [place] [address]    CT Chest W Contrast Result Date: 06/17/2023 CLINICAL DATA:  Pneumonia, complication suspected, xray done possible right sided pneumonia, persistent x1 month EXAM: CT CHEST WITH CONTRAST TECHNIQUE: Multidetector CT imaging of the chest was performed during intravenous contrast administration. RADIATION DOSE REDUCTION: This exam was performed according to the departmental dose-optimization program which includes automated exposure control, adjustment of the mA and/or kV according to patient size and/or use of iterative reconstruction technique. CONTRAST:  75mL OMNIPAQUE  IOHEXOL  300 MG/ML  SOLN COMPARISON:  06/17/2023, 05/21/2023 FINDINGS: Cardiovascular: The heart is unremarkable without pericardial effusion. 4 cm ascending thoracic aortic aneurysm. No evidence of dissection. Atherosclerosis of the aorta and coronary vasculature. Mediastinum/Nodes: Persistent mediastinal adenopathy, with largest lymph node in the right paratracheal region measuring 2 cm in short axis. Thyroid, trachea, and esophagus are unremarkable. Lungs/Pleura: Moderate right pleural effusion, increased since prior exam. Pleural nodularity within the right anterior costophrenic angle concerning for pleural based metastases. There is progressive dense consolidation within the right lower lobe. The appearance is somewhat masslike, and underlying neoplasm is suspected. Multiple bilateral pulmonary nodules are again identified, not appreciably changed and suspicious for metastatic disease. Index nodules are as follows: Right middle lobe, 9 mm, image 95/3. Left lower lobe, 9 mm, image 101/3 Left upper lobe, 8 mm, image 40/3. No pneumothorax. Upper Abdomen: Soft tissue nodularity within the lower anterior mediastinum anterior to  the right hemidiaphragm measures up to 1.9 cm, reference image 123/2, suspicious for metastatic disease. Small calcified gallstones are identified without cholecystitis. No other acute upper abdominal findings. Musculoskeletal: There is a lytic lesion within the right anterolateral aspect of the T8 vertebral body, reference image 85/2, new since prior study and concerning for bony metastasis. No other destructive bony abnormalities or acute fractures. Reconstructed images demonstrate no additional findings. IMPRESSION: 1. Progressive dense masslike consolidation within the right lower lobe, concerning for underlying neoplasm. Further evaluation with PET scan may be useful if the patient would be a therapy candidate should neoplasm be detected. 2. Numerous bilateral pulmonary nodules, concerning for metastatic disease. 3. Progressive mediastinal lymphadenopathy, consistent with nodal metastases. 4. Enlarging right pleural effusion, with soft tissue nodularity along the right anterior costophrenic angle concerning for pleural based metastases. 5. New lytic lesion within the right anterolateral aspect of the T8 vertebral body, concerning for bony metastasis. 6. 4 cm ascending thoracic aortic aneurysm. Recommend annual imaging followup by CTA or MRA. This recommendation follows 2010 ACCF/AHA/AATS/ACR/ASA/SCA/SCAI/SIR/STS/SVM Guidelines for the Diagnosis and Management of Patients with Thoracic Aortic Disease. Circulation. 2010; 121: U045-W098. Aortic aneurysm NOS (ICD10-I71.9) 7.  Aortic Atherosclerosis (ICD10-I70.0). Electronically Signed   By: Bobbye Burrow M.D.   On: 06/17/2023 23:42   DG Chest 2 View Result Date: 06/17/2023 CLINICAL DATA:  cough EXAM: CHEST - 2 VIEW COMPARISON:  Chest x-ray 05/19/2023 FINDINGS: The heart and mediastinal contours are unchanged. Atherosclerotic plaque. Right mid to lower lung zone airspace opacity. No pulmonary edema. Interval development of a moderate volume right pleural  effusion. Pleural effusion. No pneumothorax. No acute osseous abnormality. IMPRESSION: 1. Interval development of  a moderate volume right pleural effusion. 2. Right mid to lower lung zone airspace opacity. 3.  Aortic Atherosclerosis (ICD10-I70.0). Electronically Signed   By: Morgane  Naveau M.D.   On: 06/17/2023 21:39   CT Head Wo Contrast Result Date: 06/17/2023 CLINICAL DATA:  Delirium Pt had worsening weakness past 2 days where he needed assistance getting off the toilet. Pt fell 1/30. Reports decreased appetite and started on megestro EXAM: CT HEAD WITHOUT CONTRAST TECHNIQUE: Contiguous axial images were obtained from the base of the skull through the vertex without intravenous contrast. RADIATION DOSE REDUCTION: This exam was performed according to the departmental dose-optimization program which includes automated exposure control, adjustment of the mA and/or kV according to patient size and/or use of iterative reconstruction technique. COMPARISON:  None Available. FINDINGS: Brain: Cerebral ventricle sizes are concordant with the degree of cerebral volume loss. Patchy and confluent areas of decreased attenuation are noted throughout the deep and periventricular white matter of the cerebral hemispheres bilaterally, compatible with chronic microvascular ischemic disease. Age-indeterminate right cerebellar loss of gray-white matter differentiation. No evidence of large-territorial acute infarction. No parenchymal hemorrhage. No mass lesion. No extra-axial collection. No mass effect or midline shift. No hydrocephalus. Basilar cisterns are patent. Vascular: No hyperdense vessel. Atherosclerotic calcifications are present within the cavernous internal carotid and vertebral arteries. Skull: No acute fracture or focal lesion. Sinuses/Orbits: Paranasal sinuses and mastoid air cells are clear. The orbits are unremarkable. Other: None. IMPRESSION: 1. 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 2. No acute  intracranial hemorrhage. These results were called by telephone at the time of interpretation on 06/17/2023 at 9:33 pm to provider Russella Courts , who verbally acknowledged these results. Electronically Signed   By: Morgane  Naveau M.D.   On: 06/17/2023 21:33    Pending Labs Unresulted Labs (From admission, onward)    None       Vitals/Pain Today's Vitals   06/17/23 2350 06/17/23 2355 06/17/23 2356 06/18/23 0015  BP: (!) 114/55   (!) 126/56  Pulse:  71 73 72  Resp:  17 18 (!) 23  Temp:      TempSrc:      SpO2:  96% 98% 97%  Weight:      Height:      PainSc:        Isolation Precautions No active isolations  Medications Medications  sodium chloride  0.9 % bolus 1,000 mL (0 mLs Intravenous Stopped 06/18/23 0020)  iohexol  (OMNIPAQUE ) 300 MG/ML solution 75 mL (75 mLs Intravenous Contrast Given 06/17/23 2256)    Mobility walks with device     Focused Assessments     R Recommendations: See Admitting Provider Note  Report given to:   Additional Notes:

## 2023-06-18 NOTE — Progress Notes (Signed)
 Patient tolerated right sided Thoracentesis procedure well today and 1.4 Liters of pleural fluid removed and sent to lab for processing. Patient transport via stretcher for post chest xray then back to inpatient bed assignment. Patient's wife and son verbalized understanding of post procedure instructions and updated post procedure of amount of fluid drawn. Patient had no acute distress noted at departure from department.

## 2023-06-18 NOTE — Evaluation (Signed)
 Occupational Therapy Evaluation Patient Details Name: HANIEL MYSLINSKI MRN: [id number] DOB: 1945/12/04 Today's Date: 06/18/2023   History of Present Illness MARVON MOUSLEY is a 78 y.o. 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  Patient usually ambulates with a walker at baseline, but he has not been able to walk.  He presented to electrocardiography last week continue Revlimid  was administered on 2/5.  Patient follows with Dr. Katragadda, per oncology medical record, his appetite has been poor over the last few weeks but rapidly worsening within last few days to 1 week.  He was started on Megace  2/5 for appetite stimulation.  Patient has lost over 20 pounds within the last 6 to 7 weeks per son at bedside. (per DO)   Clinical Impression   Pt agreeable to OT and PT co-evaluation. Pt required min A for bed mobility and mostly CGA for transfer to chair and ambulation in the room. Wife reports that the pt has trouble getting off the toilet at times. Wife assist pt for bathing and dressing at baseline. Pt generally weak in B UE. Pt was left in the chair with call bell within reach and family present. Pt will benefit from continued OT in the hospital and recommended venue below to increase strength, balance, and endurance for safe ADL's.         If plan is discharge home, recommend the following: A little help with walking and/or transfers;A lot of help with bathing/dressing/bathroom;Assistance with cooking/housework;Assist for transportation;Help with stairs or  ramp for entrance    Functional Status Assessment  Patient has had a recent decline in their functional status and demonstrates the ability to make significant improvements in function in a reasonable and predictable amount of time.  Equipment Recommendations  BSC/3in1           Precautions / Restrictions Precautions Precautions: Fall Restrictions Weight Bearing Restrictions Per Provider Order: No      Mobility Bed Mobility Overal bed mobility: Needs Assistance Bed Mobility: Supine to Sit     Supine to sit: Min assist     General bed mobility comments: Hand held assist to pull to sit; labored effort.    Transfers Overall transfer level: Needs assistance Equipment used: Rolling walker (2 wheels) Transfers: Sit to/from Stand, Bed to chair/wheelchair/BSC Sit to Stand: Contact guard assist, Min assist     Step pivot transfers: Contact guard assist     General transfer comment: Able to improve sit to stand with coaching to CGA. CGA step pivot to chair as well.      Balance                                           ADL either performed or assessed with clinical judgement   ADL Overall ADL's : Needs assistance/impaired Eating/Feeding: Set up;Modified independent;Sitting   Grooming: Set up;Sitting   Upper Body Bathing: Set up;Contact guard assist;Sitting   Lower Body Bathing: Maximal assistance;Sitting/lateral leans   Upper Body Dressing : Set up;Contact guard assist;Sitting  Lower Body Dressing: Maximal assistance;Sitting/lateral leans   Toilet Transfer: Contact guard assist;Rolling walker (2 wheels);Ambulation;Stand-pivot Toilet Transfer Details (indicate cue type and reason): Simualted via EOB to chair and ambulation in the room and hall. Toileting- Clothing Manipulation and Hygiene: Set up;Contact guard assist;Sitting/lateral lean       Functional mobility during ADLs: Contact guard assist;Rolling walker (2 wheels) General ADL  Comments: Able to ambulate several feet in the hall with RW     Vision Baseline Vision/History: 1 Wears glasses Ability to See in Adequate Light: 1 Impaired Patient Visual Report: No change from baseline Vision Assessment?: No apparent visual deficits     Perception Perception: Not tested       Praxis Praxis: Not tested       Pertinent Vitals/Pain Pain Assessment Pain Assessment: No/denies pain     Extremity/Trunk Assessment Upper Extremity Assessment Upper Extremity Assessment: Generalized weakness   Lower Extremity Assessment Lower Extremity Assessment: Defer to PT evaluation   Cervical / Trunk Assessment Cervical / Trunk Assessment: Kyphotic   Communication Communication Communication: No apparent difficulties   Cognition Arousal: Alert Behavior During Therapy: WFL for tasks assessed/performed Overall Cognitive Status: Within Functional Limits for tasks assessed                                                        Home Living Family/patient expects to be discharged to:: Private residence Living Arrangements: Spouse/significant other Available Help at Discharge: Family;Available 24 hours/day Type of Home: House Home Access: Stairs to enter [employer] of Steps: 2 Entrance Stairs-Rails: Can reach both Home Layout: One level     Bathroom Shower/Tub: Producer, television/film/video: Handicapped height     Home Equipment: Rollator (4 wheels);Rolling Walker (2 wheels);Grab bars - toilet;Grab bars - tub/shower (upwalker)          Prior Functioning/Environment Prior Level of Function : Needs assist;History of Falls (last six months) (1 in past 6 months)       Physical Assist : ADLs (physical);Mobility (physical) Mobility (physical): Transfers ADLs (physical): Bathing;Dressing;Toileting;IADLs Mobility Comments: has used an Astronomer for some time, but more recently using RW to get off the toilet, cane used to get into  the car. ADLs Comments: Assit with bathing and dressing from wife. Usually independent with toileting. Abele to groom and feed.        OT Problem List: Decreased strength;Decreased activity tolerance;Impaired balance (sitting and/or standing);Decreased safety awareness      OT Treatment/Interventions: Self-care/ADL training;Therapeutic exercise;Therapeutic activities;Patient/family education;Energy conservation    OT Goals(Current goals can be found in the care plan section) Acute Rehab OT Goals Patient Stated Goal: return home OT Goal Formulation: With patient/family Time For Goal Achievement: 07/02/23 Potential to Achieve Goals: Good  OT Frequency: Min 1X/week    Co-evaluation PT/OT/SLP Co-Evaluation/Treatment: Yes Reason for Co-Treatment: To address functional/ADL transfers   OT goals addressed during session: ADL's and self-care                       End of Session Equipment Utilized During Treatment: Rolling walker (2 wheels);Gait belt  Activity Tolerance: Patient tolerated treatment well Patient left: in chair;with call bell/phone within reach;with family/visitor present  OT Visit Diagnosis: Unsteadiness on feet (R26.81);Other abnormalities of gait and mobility (R26.89);Muscle weakness (generalized) (M62.81)  Time: 8295-6213 OT Time Calculation (min): 15 min Charges:  OT General Charges $OT Visit: 1 Visit OT Evaluation $OT Eval Low Complexity: 1 Low  Amiria Orrison OT, MOT  Thurnell Floss 06/18/2023, 9:17 AM

## 2023-06-18 NOTE — Progress Notes (Signed)
 Pt arrived to unit 300 alert and oriented to self, situation, and location. Disoriented to age.  He is pleasant. Denies pain. C/o generalized weakness. Lungs sounds clear bilaterally.  Audible bowel sounds. H has bruising to Right hip, right shoulder, and buttock from a fall a week ago. Redness to sacrum but no broken skin. Sacral foam placed. Wife is supportive and at bedside.  Pt reports he has not had his ASA or prevastatin today and would like to take it. Jacinto Martins, RN

## 2023-06-18 NOTE — Evaluation (Signed)
 Physical Therapy Evaluation Patient Details Name: Aaron Sexton MRN: [id number] DOB: 1946-04-30 Today's Date: 06/18/2023  History of Present Illness  Aaron Sexton is a 78 y.o. 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  Patient usually ambulates with a walker at baseline, but he has not been able to walk.  He presented to electrocardiography last week continue Revlimid  was administered on 2/5.  Patient follows with Dr. Katragadda, per oncology medical record, his appetite has been poor over the last few weeks but rapidly worsening within last few days to 1 week.  He was started on Megace  2/5 for appetite stimulation.  Patient has lost over 20 pounds within the last 6 to 7 weeks per son at bedside.   Clinical Impression  Patient demonstrates slow labored movement for sitting up at bedside, required verbal cueing for proper hand placement on RW with good carryover during sit to stands, able to ambulate in room/hallway without loss of balance, but limited mostly due to fatigue.  Patient tolerated sitting up in chair after therapy with spouse present. Patient will benefit from continued skilled physical therapy in hospital and recommended venue below to increase strength, balance, endurance for safe ADLs and gait.            If plan is discharge home, recommend the following: A little help with walking and/or transfers;A little help with bathing/dressing/bathroom;Help with stairs or ramp for entrance;Assistance with cooking/housework   Can travel by private  vehicle        Equipment Recommendations None recommended by PT  Recommendations for Other Services       Functional Status Assessment Patient has had a recent decline in their functional status and demonstrates the ability to make significant improvements in function in a reasonable and predictable amount of time.     Precautions / Restrictions Precautions Precautions: Fall Restrictions Weight Bearing Restrictions Per Provider Order: No      Mobility  Bed Mobility Overal bed mobility: Needs Assistance Bed Mobility: Supine to Sit     Supine to sit: Min assist     General bed mobility comments: increased time, labored movement    Transfers Overall transfer level: Needs assistance Equipment used: Rolling walker (2 wheels) Transfers: Sit to/from Stand, Bed to chair/wheelchair/BSC Sit to Stand: Contact guard assist, Min assist   Step pivot transfers: Contact guard assist       General transfer comment: required verbal cueing for proper hand placement on walker during sit to stands with fair/good carryover    Ambulation/Gait Ambulation/Gait assistance: Contact guard assist, Min assist Gait Distance (Feet): 50 Feet Assistive device: Rolling walker (2 wheels) Gait Pattern/deviations: Decreased step length - right, Decreased step length - left, Decreased stride length Gait velocity: decreased     General Gait Details: slow slightly labored cadence without loss of balance, limited mostly due to fatigue  Stairs            Wheelchair Mobility     Tilt Bed    Modified Rankin (Stroke Patients Only)       Balance Overall balance assessment: Needs assistance Sitting-balance support: Feet supported,  No upper extremity supported Sitting balance-Leahy Scale: Fair Sitting balance - Comments: fair/good seated at EOB   Standing balance support: During functional activity, Reliant on assistive device for balance, Bilateral upper extremity supported Standing  balance-Leahy Scale: Fair Standing balance comment: using RW                             Pertinent Vitals/Pain Pain Assessment Pain Assessment: No/denies pain    Home Living Family/patient expects to be discharged to:: Private residence Living Arrangements: Spouse/significant other Available Help at Discharge: Family;Available 24 hours/day Type of Home: House Home Access: Stairs to enter Entrance Stairs-Rails: Can reach both Entrance Stairs-Number of Steps: 2   Home Layout: One level Home Equipment: Rollator (4 wheels);Rolling Walker (2 wheels);Grab bars - toilet;Grab bars - tub/shower      Prior Function Prior Level of Function : Needs assist;History of Falls (last six months)       Physical Assist : ADLs (physical);Mobility (physical) Mobility (physical): Transfers;Bed mobility;Gait;Stairs   Mobility Comments: has used an upwalker for some time, but more recently using RW to get off the toilet, cane used to get into the car. ADLs Comments: Assit with bathing and dressing from wife. Usually independent with toileting. Abele to groom and feed.     Extremity/Trunk Assessment   Upper Extremity Assessment Upper Extremity Assessment: Defer to OT evaluation    Lower Extremity Assessment Lower Extremity Assessment: Generalized weakness    Cervical / Trunk Assessment Cervical / Trunk Assessment: Kyphotic  Communication   Communication Communication: No apparent difficulties  Cognition Arousal: Alert Behavior During Therapy: WFL for tasks assessed/performed Overall Cognitive Status: Within Functional Limits for tasks assessed                                          General Comments      Exercises     Assessment/Plan    PT Assessment Patient needs continued PT services  PT Problem List Decreased strength;Decreased activity tolerance;Decreased balance;Decreased mobility       PT Treatment Interventions DME instruction;Gait  training;Stair training;Functional mobility training;Therapeutic activities;Therapeutic exercise;Balance training;Patient/family education    PT Goals (Current goals can be found in the Care Plan section)  Acute Rehab PT Goals Patient Stated Goal: return home with family to assist PT Goal Formulation: With patient/family Time For Goal Achievement: 06/22/23 Potential to Achieve Goals: Good    Frequency Min 3X/week     Co-evaluation PT/OT/SLP Co-Evaluation/Treatment: Yes Reason for Co-Treatment: To address functional/ADL transfers PT goals addressed during session: Mobility/safety with mobility;Balance;Proper use of DME         AM-PAC PT "6 Clicks" Mobility  Outcome Measure Help needed turning from your back to your side while in a flat bed without using bedrails?: A Little Help needed moving from lying on your back to sitting on the side of a flat bed without using bedrails?: A Lot Help needed moving to and from a bed to a chair (including a wheelchair)?: A Little Help needed standing up from a chair using your arms (e.g., wheelchair or bedside chair)?: A Little Help needed to walk in hospital room?: A Little Help needed climbing 3-5 steps with a railing? : A Lot 6 Click Score: 16    End of Session   Activity Tolerance: Patient tolerated treatment well;Patient limited by fatigue Patient left: in chair;with call  bell/phone within reach;with family/visitor present Nurse Communication: Mobility status PT Visit Diagnosis: Unsteadiness on feet (R26.81);Other abnormalities of gait and mobility (R26.89);Muscle weakness (generalized) (M62.81)    Time: 0981-1914 PT Time Calculation (min) (ACUTE ONLY): 15 min   Charges:   PT Evaluation $PT Eval Low Complexity: 1 Low PT Treatments $Therapeutic Activity: 8-22 mins PT General Charges $$ ACUTE PT VISIT: 1 Visit         1:51 PM, 06/18/23 Walton Guppy, MPT Physical Therapist with [facility] 336 [phone]  office [phone] mobile phone

## 2023-06-18 NOTE — Consult Note (Signed)
 [facility] Consultation Oncology  Name: Aaron Sexton      MRN: [id number]    Location: W098/J191-47  Date: 06/18/2023 Time:4:19 PM   REFERRING PHYSICIAN: Dr. Alva Jewels  REASON FOR CONSULT: Right lung mass with lung nodules and mediastinal adenopathy    HISTORY OF PRESENT ILLNESS: Aaron Sexton is a 78 year old male known to me from office visits.  He was evaluated in the ER on 06/17/2023 with weakness.  Family including wife and son and daughter-in-law at bedside tells me that the weakness started around Saturday morning.  CT chest showed progressive dense mass and numerous bilateral lung nodules concerning for metastatic disease.  There was also progressive mediastinal lymphadenopathy.  Enlarging right pleural effusion with new lytic lesion within the right anterolateral aspect of the T8 vertebral body.  He underwent right thoracentesis yielding 1.4 L of serous pleural fluid.  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  Prior to that he was admitted to [facility] and was treated for pneumonia based on CT scan findings.  PAST MEDICAL HISTORY:   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   Cerebrovascular disease    Moderate ASVD without focal stenosis in 01/2007   Clotting disorder (HCC)    Colon polyps 07/2010   single 2mm polyp--tubular adenoma   Diverticulosis 2012   found on colonoscopy   Erectile dysfunction    Family history of adverse reaction to anesthesia    difficult for son & sistor to wake    GERD (gastroesophageal reflux disease)    Hematochezia 05/24/2010   Hyperlipidemia    Hypertension    Osteoarthritis    knees/hands-Dr Genevive Ket   Overweight(278.02)    Peripheral vascular disease (HCC) 03/11/2010   Moderate SFA stenosis; history of claudication   Pneumonia    Rosacea     Vitamin B12 deficiency     ALLERGIES: Allergies  Allergen Reactions   Cardizem [Diltiazem Hcl] Hives and Rash      MEDICATIONS: I have reviewed the patient's current medications.     PAST SURGICAL HISTORY Past Surgical History:  Procedure Laterality Date   APPENDECTOMY     APPLICATION OF WOUND VAC Left 12/17/2021   Procedure: APPLICATION OF PREVENA WOUND VAC LEFT GROIN;  Surgeon: Kayla Part, MD;  Location: [facility] OR;  Service: Vascular;  Laterality: Left;   APPLICATION OF WOUND VAC Left 01/08/2022   Procedure: APPLICATION OF WOUND VAC left groin.;  Surgeon: Young Hensen, MD;  Location: [facility] OR;  Service: Vascular;  Laterality: Left;   COLONOSCOPY W/ POLYPECTOMY  2012   ENDARTERECTOMY FEMORAL Left 12/09/2021   Procedure: LEFT COMMON FEMORAL ENDARTERECTOMY WITH 1 CM X 6 CM XENOSURE BOVINE PATCH ANGIOPLASTY;  Surgeon: Young Hensen, MD;  Location: [facility] OR;  Service: Vascular;  Laterality: Left;   FEMORAL-POPLITEAL BYPASS GRAFT Left 12/09/2021   Procedure: LEFT FEMORAL- ABOVE KNEE POPLITEAL BYPASS WITH 6 mm x 80 cm PROPATEN GRAFT;  Surgeon: Young Hensen, MD;  Location: [facility] OR;  Service: Vascular;  Laterality: Left;  INSERT ARTERIAL LINE   ILIAC VEIN ANGIOPLASTY / STENTING Right 08/09/2015   INCISION AND DRAINAGE OF WOUND Left 01/08/2022   Procedure: IRRIGATION AND DEBRIDEMENT GROIN LEFT application of Myriad Morcells.;  Surgeon: Young Hensen, MD;  Location: [facility] OR;  Service:  Vascular;  Laterality: Left;   INSERTION OF ILIAC STENT  12/09/2021   Procedure: INSERTION OF LEFT COMMON AND EXTERNAL  ILIAC STENTS WITH ANGIOPLASTY;  Surgeon: Young Hensen, MD;  Location: [facility] OR;  Service: Vascular;;   LOWER EXTREMITY ANGIOGRAM Left 12/09/2021   Procedure: LEFT ILIAC AND LOWER EXTREMITY ANGIOGRAM;  Surgeon: Young Hensen, MD;  Location: [facility] OR;  Service: Vascular;  Laterality: Left;   LOWER EXTREMITY ANGIOGRAPHY Bilateral 11/03/2021   Procedure: Lower Extremity Angiography;   Surgeon: Avanell Leigh, MD;  Location: [facility] INVASIVE CV LAB;  Service: Cardiovascular;  Laterality: Bilateral;  Limited Study   PERIPHERAL VASCULAR CATHETERIZATION Bilateral 06/21/2015   Procedure: Lower Extremity Angiography;  Surgeon: Avanell Leigh, MD;  Location: [facility] INVASIVE CV LAB;  Service: Cardiovascular;  Laterality: Bilateral;   PERIPHERAL VASCULAR CATHETERIZATION N/A 06/21/2015   Procedure: Abdominal Aortogram;  Surgeon: Avanell Leigh, MD;  Location: [facility] INVASIVE CV LAB;  Service: Cardiovascular;  Laterality: N/A;   PERIPHERAL VASCULAR CATHETERIZATION N/A 07/26/2015   Procedure: Lower Extremity Angiography;  Surgeon: Avanell Leigh, MD;  Location: [facility] INVASIVE CV LAB;  Service: Cardiovascular;  Laterality: N/A;   PERIPHERAL VASCULAR CATHETERIZATION N/A 08/09/2015   Procedure: Lower Extremity Angiography;  Surgeon: Avanell Leigh, MD;  Location: [facility] INVASIVE CV LAB;  Service: Cardiovascular;  Laterality: N/A;   PERIPHERAL VASCULAR CATHETERIZATION  08/09/2015   Procedure: Peripheral Vascular Intervention;  Surgeon: Avanell Leigh, MD;  Location: [facility] INVASIVE CV LAB;  Service: Cardiovascular;;  rt ext. iliac atherectomy and stent   PERIPHERAL VASCULAR CATHETERIZATION N/A 09/20/2015   Procedure: Lower Extremity Angiography;  Surgeon: Avanell Leigh, MD;  Location: [facility] INVASIVE CV LAB;  Service: Cardiovascular;  Laterality: N/A;   PERIPHERAL VASCULAR CATHETERIZATION Right 09/20/2015   Procedure: Peripheral Vascular Intervention;  Surgeon: Avanell Leigh, MD;  Location: [facility] INVASIVE CV LAB;  Service: Cardiovascular;  Laterality: Right;  SFA   ROTATOR CUFF REPAIR Left 7/15   Dr Genevive Ket   WOUND EXPLORATION Left 12/17/2021   Procedure: LEFT LEG WOUND EXPLORATION AND WASH OUT WITH MYRIAD MORCELLS;  Surgeon: Kayla Part, MD;  Location: [facility] OR;  Service: Vascular;  Laterality: Left;    FAMILY HISTORY: Family History  Problem Relation Age of Onset   Lung cancer Mother    Diabetes Mother     Hypertension Father        And siblings   Heart disease Father        And second-degree relatives   Transient ischemic attack Father    Brain cancer Sister    Heart disease Sister    Heart disease Sister    Thyroid disease Sister    Diabetes Brother    Atrial fibrillation Brother    Ulcers Son    Diabetes Son    Diabetes Son    Colon cancer Neg Hx    Stomach cancer Neg Hx    Rectal cancer Neg Hx     SOCIAL HISTORY:  reports that he quit smoking about 33 years ago. His smoking use included cigarettes. He started smoking about 63 years ago. He has a 30 pack-year smoking history. He has never been exposed to tobacco smoke. He has never used smokeless tobacco. He reports that he does not drink alcohol and does not use drugs.  PERFORMANCE STATUS: The patient's performance status is 2 - Symptomatic, <50% confined to bed  PHYSICAL EXAM: Most Recent Vital Signs: Blood pressure 138/67, pulse 81, temperature 98.3 F (36.8 C), resp. rate  18, height 5\' 7"  (1.702 m), weight 173 lb (78.5 kg), SpO2 98%. BP 138/67 (BP Location: Right Arm)   Pulse 81   Temp 98.3 F (36.8 C)   Resp 18   Ht 5\' 7"  (1.702 m)   Wt 173 lb (78.5 kg)   SpO2 98%   BMI 27.10 kg/m  General appearance: appears stated age, sleeping  LABORATORY DATA:  Results for orders placed or performed during the hospital encounter of 06/17/23 (from the past 48 hours)  Urinalysis, Routine w reflex microscopic -Urine, Clean Catch     Status: Abnormal   Collection Time: 06/17/23  7:15 PM  Result Value Ref Range   Color, Urine YELLOW YELLOW   APPearance CLEAR CLEAR   Specific Gravity, Urine 1.019 1.005 - 1.030   pH 5.0 5.0 - 8.0   Glucose, UA NEGATIVE NEGATIVE mg/dL   Hgb urine dipstick NEGATIVE NEGATIVE   Bilirubin Urine NEGATIVE NEGATIVE   Ketones, ur NEGATIVE NEGATIVE mg/dL   Protein, ur 30 (A) NEGATIVE mg/dL   Nitrite NEGATIVE NEGATIVE   Leukocytes,Ua NEGATIVE NEGATIVE   RBC / HPF 0-5 0 - 5 RBC/hpf   WBC, UA 0-5 0 - 5  WBC/hpf   Bacteria, UA NONE SEEN NONE SEEN   Squamous Epithelial / HPF 0-5 0 - 5 /HPF   Mucus PRESENT     Comment: Performed at [facility], [address], [place], [place] [address]  Sodium, urine, random     Status: None   Collection Time: 06/17/23  7:15 PM  Result Value Ref Range   Sodium, Ur 76 mmol/L    Comment: Performed at [facility], [address] [place], [place] [address]  Resp panel by RT-PCR (RSV, Flu A&B, Covid) Anterior Nasal Swab     Status: None   Collection Time: 06/17/23  7:26 PM   Specimen: Anterior Nasal Swab  Result Value Ref Range   SARS Coronavirus 2 by RT PCR NEGATIVE NEGATIVE    Comment: (NOTE) SARS-CoV-2 target nucleic acids are NOT DETECTED.  The SARS-CoV-2 RNA is generally detectable in upper respiratory specimens during the acute phase of infection. The lowest concentration of SARS-CoV-2 viral copies this assay can detect is 138 copies/mL. A negative result does not preclude SARS-Cov-2 infection and should not be used as the sole basis for treatment or other patient management decisions. A negative result may occur with  improper specimen collection/handling, submission of specimen other than nasopharyngeal swab, presence of viral mutation(s) within the areas targeted by this assay, and inadequate number of viral copies(<138 copies/mL). A negative result must be combined with clinical observations, patient history, and epidemiological information. The expected result is Negative.  Fact Sheet for Patients:  BloggerCourse.com  Fact Sheet for Healthcare Providers:  SeriousBroker.it  This test is no t yet approved or cleared by the [place]  FDA and  has been authorized for detection and/or diagnosis of SARS-CoV-2 by FDA under an Emergency Use Authorization (EUA). This EUA will remain  in effect (meaning this test can be used) for the duration of the COVID-19 declaration under Section 564(b)(1)  of the Act, 21 U.S.C.section 360bbb-3(b)(1), unless the authorization is terminated  or revoked sooner.       Influenza A by PCR NEGATIVE NEGATIVE   Influenza B by PCR NEGATIVE NEGATIVE    Comment: (NOTE) The Xpert Xpress SARS-CoV-2/FLU/RSV plus assay is intended as an aid in the diagnosis of influenza from Nasopharyngeal swab specimens and should not be used as a sole basis for treatment.  Nasal washings and aspirates are unacceptable for Xpert Xpress SARS-CoV-2/FLU/RSV testing.  Fact Sheet for Patients: BloggerCourse.com  Fact Sheet for Healthcare Providers: SeriousBroker.it  This test is not yet approved or cleared by the [place]  FDA and has been authorized for detection and/or diagnosis of SARS-CoV-2 by FDA under an Emergency Use Authorization (EUA). This EUA will remain in effect (meaning this test can be used) for the duration of the COVID-19 declaration under Section 564(b)(1) of the Act, 21 U.S.C. section 360bbb-3(b)(1), unless the authorization is terminated or revoked.     Resp Syncytial Virus by PCR NEGATIVE NEGATIVE    Comment: (NOTE) Fact Sheet for Patients: BloggerCourse.com  Fact Sheet for Healthcare Providers: SeriousBroker.it  This test is not yet approved or cleared by the [place]  FDA and has been authorized for detection and/or diagnosis of SARS-CoV-2 by FDA under an Emergency Use Authorization (EUA). This EUA will remain in effect (meaning this test can be used) for the duration of the COVID-19 declaration under Section 564(b)(1) of the Act, 21 U.S.C. section 360bbb-3(b)(1), unless the authorization is terminated or revoked.  Performed at [facility], [address], [place], [place] [address]   CBC with Differential     Status: Abnormal   Collection Time: 06/17/23  7:27 PM  Result Value Ref Range   WBC 4.3 4.0 - 10.5 K/uL   RBC 2.13 (L)  4.22 - 5.81 MIL/uL   Hemoglobin 7.5 (L) 13.0 - 17.0 g/dL   HCT 60.4 (L) 54.0 - 98.1 %   MCV 106.1 (H) 80.0 - 100.0 fL   MCH 35.2 (H) 26.0 - 34.0 pg   MCHC 33.2 30.0 - 36.0 g/dL   RDW 19.1 (H) 47.8 - 29.5 %   Platelets 62 (L) 150 - 400 K/uL    Comment: SPECIMEN CHECKED FOR CLOTS REPEATED TO VERIFY    nRBC 0.0 0.0 - 0.2 %   Neutrophils Relative % 65 %   Neutro Abs 2.8 1.7 - 7.7 K/uL   Lymphocytes Relative 26 %   Lymphs Abs 1.1 0.7 - 4.0 K/uL   Monocytes Relative 7 %   Monocytes Absolute 0.3 0.1 - 1.0 K/uL   Eosinophils Relative 1 %   Eosinophils Absolute 0.1 0.0 - 0.5 K/uL   Basophils Relative 0 %   Basophils Absolute 0.0 0.0 - 0.1 K/uL   Immature Granulocytes 1 %   Abs Immature Granulocytes 0.02 0.00 - 0.07 K/uL    Comment: Performed at [facility], [address] [place], [place] [address]  Comprehensive metabolic panel     Status: Abnormal   Collection Time: 06/17/23  7:27 PM  Result Value Ref Range   Sodium 129 (L) 135 - 145 mmol/L   Potassium 3.9 3.5 - 5.1 mmol/L   Chloride 99 98 - 111 mmol/L   CO2 20 (L) 22 - 32 mmol/L   Glucose, Bld 105 (H) 70 - 99 mg/dL    Comment: Glucose reference range applies only to samples taken after fasting for at least 8 hours.   BUN 19 8 - 23 mg/dL   Creatinine, Ser 8.65 0.61 - 1.24 mg/dL   Calcium 78.4 8.9 - 69.6 mg/dL   Total Protein 6.9 6.5 - 8.1 g/dL   Albumin  2.5 (L) 3.5 - 5.0 g/dL   AST 295 (H) 15 - 41 U/L   ALT 108 (H) 0 - 44 U/L   Alkaline Phosphatase 140 (H) 38 - 126 U/L   Total Bilirubin 0.7 0.0 - 1.2 mg/dL   GFR,  Estimated >60 >60 mL/min    Comment: (NOTE) Calculated using the CKD-EPI Creatinine Equation (2021)    Anion gap 10 5 - 15    Comment: Performed at [facility], [address], [place], [place] [address]  Lipase, blood     Status: None   Collection Time: 06/17/23  7:27 PM  Result Value Ref Range   Lipase 39 11 - 51 U/L    Comment: Performed at [facility], [address], [place], [place] [address]  Troponin I  (High Sensitivity)     Status: Abnormal   Collection Time: 06/17/23  7:27 PM  Result Value Ref Range   Troponin I (High Sensitivity) 35 (H) <18 ng/L    Comment: (NOTE) Elevated high sensitivity troponin I (hsTnI) values and significant  changes across serial measurements may suggest ACS but many other  chronic and acute conditions are known to elevate hsTnI results.  Refer to the "Links" section for chest pain algorithms and additional  guidance. Performed at [facility], [address] [place], [place] [address]   Ammonia     Status: None   Collection Time: 06/17/23  8:52 PM  Result Value Ref Range   Ammonia <10 9 - 35 umol/L    Comment: Performed at [facility], [address] [place], [place] [address]  Troponin I (High Sensitivity)     Status: Abnormal   Collection Time: 06/17/23  9:03 PM  Result Value Ref Range   Troponin I (High Sensitivity) 37 (H) <18 ng/L    Comment: (NOTE) Elevated high sensitivity troponin I (hsTnI) values and significant  changes across serial measurements may suggest ACS but many other  chronic and acute conditions are known to elevate hsTnI results.  Refer to the "Links" section for chest pain algorithms and additional  guidance. Performed at [facility], [address], [place], [place] [address]   Vitamin B12     Status: None   Collection Time: 06/18/23  4:05 AM  Result Value Ref Range   Vitamin B-12 291 180 - 914 pg/mL    Comment: (NOTE) This assay is not validated for testing neonatal or myeloproliferative syndrome specimens for Vitamin B12 levels. Performed at [facility], [address], [place], [place] [address]   Folate     Status: Abnormal   Collection Time: 06/18/23  4:05 AM  Result Value Ref Range   Folate 5.2 (L) >5.9 ng/mL    Comment: Performed at [facility] [place], [address] [place], [place] [address]  Osmolality     Status: None   Collection Time: 06/18/23  4:05 AM  Result Value Ref Range   Osmolality 282 275 - 295 mOsm/kg     Comment: Performed at [facility], [address], [place], [place] [address]  Hepatitis panel, acute     Status: None   Collection Time: 06/18/23  4:05 AM  Result Value Ref Range   Hepatitis B Surface Ag NON REACTIVE NON REACTIVE   HCV Ab NON REACTIVE NON REACTIVE    Comment: (NOTE) Nonreactive HCV antibody screen is consistent with no HCV infections,  unless recent infection is suspected or other evidence exists to indicate HCV infection.     Hep A IgM NON REACTIVE NON REACTIVE   Hep B C IgM NON REACTIVE NON REACTIVE    Comment: Performed at [facility], [address] [place], [place] [address]  Comprehensive metabolic panel     Status: Abnormal   Collection Time: 06/18/23  4:05 AM  Result Value Ref Range   Sodium 128 (L) 135 - 145 mmol/L   Potassium 3.8 3.5 - 5.1 mmol/L   Chloride 101 98 - 111 mmol/L   CO2 20 (L) 22 - 32 mmol/L   Glucose, Bld 85 70 - 99 mg/dL    Comment: Glucose reference range applies only to samples taken after fasting for at least 8 hours.   BUN 17 8 - 23 mg/dL   Creatinine, Ser 4.09 0.61 - 1.24 mg/dL   Calcium 9.7 8.9 - 81.1 mg/dL   Total Protein 6.3 (L) 6.5 - 8.1 g/dL   Albumin  2.4 (L) 3.5 - 5.0 g/dL   AST 85 (H) 15 - 41 U/L   ALT 85 (H) 0 - 44 U/L   Alkaline Phosphatase 123 38 - 126 U/L   Total Bilirubin 0.6 0.0 - 1.2 mg/dL   GFR, Estimated >91 >47 mL/min    Comment: (NOTE) Calculated using the CKD-EPI Creatinine Equation (2021)    Anion gap 7 5 - 15    Comment: Performed at [facility], [address] [place], [place] [address]  CBC     Status: Abnormal   Collection Time: 06/18/23  4:05 AM  Result Value Ref Range   WBC 3.7 (L) 4.0 - 10.5 K/uL   RBC 1.95 (L) 4.22 - 5.81 MIL/uL   Hemoglobin 7.0 (L) 13.0 - 17.0 g/dL   HCT 21.3 (L) 08.6 - 57.8 %   MCV 107.7 (H) 80.0 - 100.0 fL   MCH 35.9 (H) 26.0 - 34.0 pg   MCHC 33.3 30.0 - 36.0 g/dL   RDW 46.9 (H) 62.9 - 52.8 %   Platelets 57 (L) 150 - 400 K/uL    Comment: Immature  Platelet Fraction may be clinically indicated, consider ordering this additional test [id number]    nRBC 0.0 0.0 - 0.2 %    Comment: Performed at [facility], [address], [place], [place] [address]  Troponin I (High Sensitivity)     Status: Abnormal   Collection Time: 06/18/23  4:05 AM  Result Value Ref Range   Troponin I (High Sensitivity) 28 (H) <18 ng/L    Comment: (NOTE) Elevated high sensitivity troponin I (hsTnI) values and significant  changes across serial measurements may suggest ACS but many other  chronic and acute conditions are known to elevate hsTnI results.  Refer to the "Links" section for chest pain algorithms and additional  guidance. Performed at [facility], [address] [place], [place] [address]   Troponin I (High Sensitivity)     Status: Abnormal   Collection Time: 06/18/23  9:33 AM  Result Value Ref Range   Troponin I (High Sensitivity) 34 (H) <18 ng/L    Comment: (NOTE) Elevated high sensitivity troponin I (hsTnI) values and significant  changes across serial measurements may suggest ACS but many other  chronic and acute conditions are known to elevate hsTnI results.  Refer to the "Links" section for chest pain algorithms and additional  guidance. Performed at [facility], [address], [place], [place] [address]   Body fluid cell count with differential     Status: Abnormal   Collection Time: 06/18/23 11:45 AM  Result Value Ref Range   Fluid Type-FCT PLEURAL     Comment: CORRECTED ON 02/10 AT 1215: PREVIOUSLY REPORTED AS Pleural R   Color, Fluid YELLOW (A) YELLOW   Appearance, Fluid HAZY (A) CLEAR   Total Nucleated Cell Count, Fluid 393 0 - 1,000 cu mm  Neutrophil Count, Fluid 37 (H) 0 - 25 %   Lymphs, Fluid 43 %   Monocyte-Macrophage-Serous Fluid 20 (L) 50 - 90 %   Eos, Fluid 0 %   Other Cells, Fluid MESOTHELIAL CELLS PRESENT %    Comment: Performed at [facility], [address], [place], [place] [address]      RADIOGRAPHY: US   Abdomen Limited Result Date: 06/18/2023 CLINICAL DATA:  Transaminitis EXAM: ULTRASOUND ABDOMEN LIMITED RIGHT UPPER QUADRANT COMPARISON:  05/19/2023 FINDINGS: Gallbladder: No gallstones or wall thickening visualized. No sonographic Murphy sign noted by sonographer. Common bile duct: Diameter: 3 mm Liver: No focal lesion identified. Within normal limits in parenchymal echogenicity. Portal vein is patent on color Doppler imaging with normal direction of blood flow towards the liver. Other: Incidental right pleural effusion. IMPRESSION: 1. Incidental right pleural effusion. 2. Otherwise unremarkable right upper quadrant ultrasound. Electronically Signed   By: Bobbye Burrow M.D.   On: 06/18/2023 16:02   US  THORACENTESIS ASP PLEURAL SPACE W/IMG GUIDE Result Date: 06/18/2023 INDICATION: Symptomatic right sided pleural effusion EXAM: US  THORACENTESIS ASP PLEURAL SPACE W/IMG GUIDE COMPARISON:  Chest CT-06/17/2023 MEDICATIONS: None. COMPLICATIONS: None immediate. TECHNIQUE: Informed written consent was obtained from the patient after a discussion of the risks, benefits and alternatives to treatment. A timeout was performed prior to the initiation of the procedure. Initial ultrasound scanning demonstrates a large right-sided pleural effusion. The lower chest was prepped and draped in the usual sterile fashion. 1% lidocaine  was used for local anesthesia. An ultrasound image was saved for documentation purposes. An 8 Fr Safe-T-Centesis catheter was introduced. The thoracentesis was performed. The catheter was removed and a dressing was applied. The patient tolerated the procedure well without immediate post procedural complication. The patient was escorted to have an upright chest radiograph. FINDINGS: A total of approximately 1.4 liters of serous fluid was removed. Requested samples were sent to the laboratory. IMPRESSION: Successful ultrasound-guided right sided thoracentesis yielding 1.4 liters of pleural fluid.  Electronically Signed   By: Robbi Childs M.D.   On: 06/18/2023 12:31   DG Chest 1 View Result Date: 06/18/2023 CLINICAL DATA:  Post right-sided thoracentesis EXAM: CHEST  1 VIEW COMPARISON:  06/17/2023; chest CT-06/17/2023 FINDINGS: Unchanged enlarged cardiac silhouette and mediastinal contours with atherosclerotic plaque thoracic aorta. Interval reduction/near resolution of right-sided pleural effusion post thoracentesis with aeration base. No pneumothorax. Pulmonary vasculature remains indistinct with cephalization of flow. Suspected trace left-sided pleural effusion. No acute osseous abnormalities. IMPRESSION: 1. Interval reduction/near resolution of right-sided pleural effusion post thoracentesis with aeration of the base. No pneumothorax. 2. Similar findings of cardiomegaly and pulmonary edema with suspected trace left-sided pleural effusion. Electronically Signed   By: Robbi Childs M.D.   On: 06/18/2023 12:30   CT Chest W Contrast Result Date: 06/17/2023 CLINICAL DATA:  Pneumonia, complication suspected, xray done possible right sided pneumonia, persistent x1 month EXAM: CT CHEST WITH CONTRAST TECHNIQUE: Multidetector CT imaging of the chest was performed during intravenous contrast administration. RADIATION DOSE REDUCTION: This exam was performed according to the departmental dose-optimization program which includes automated exposure control, adjustment of the mA and/or kV according to patient size and/or use of iterative reconstruction technique. CONTRAST:  75mL OMNIPAQUE  IOHEXOL  300 MG/ML  SOLN COMPARISON:  06/17/2023, 05/21/2023 FINDINGS: Cardiovascular: The heart is unremarkable without pericardial effusion. 4 cm ascending thoracic aortic aneurysm. No evidence of dissection. Atherosclerosis of the aorta and coronary vasculature. Mediastinum/Nodes: Persistent mediastinal adenopathy, with largest lymph node in the right paratracheal region measuring 2 cm in short  axis. Thyroid, trachea, and esophagus  are unremarkable. Lungs/Pleura: Moderate right pleural effusion, increased since prior exam. Pleural nodularity within the right anterior costophrenic angle concerning for pleural based metastases. There is progressive dense consolidation within the right lower lobe. The appearance is somewhat masslike, and underlying neoplasm is suspected. Multiple bilateral pulmonary nodules are again identified, not appreciably changed and suspicious for metastatic disease. Index nodules are as follows: Right middle lobe, 9 mm, image 95/3. Left lower lobe, 9 mm, image 101/3 Left upper lobe, 8 mm, image 40/3. No pneumothorax. Upper Abdomen: Soft tissue nodularity within the lower anterior mediastinum anterior to the right hemidiaphragm measures up to 1.9 cm, reference image 123/2, suspicious for metastatic disease. Small calcified gallstones are identified without cholecystitis. No other acute upper abdominal findings. Musculoskeletal: There is a lytic lesion within the right anterolateral aspect of the T8 vertebral body, reference image 85/2, new since prior study and concerning for bony metastasis. No other destructive bony abnormalities or acute fractures. Reconstructed images demonstrate no additional findings. IMPRESSION: 1. Progressive dense masslike consolidation within the right lower lobe, concerning for underlying neoplasm. Further evaluation with PET scan may be useful if the patient would be a therapy candidate should neoplasm be detected. 2. Numerous bilateral pulmonary nodules, concerning for metastatic disease. 3. Progressive mediastinal lymphadenopathy, consistent with nodal metastases. 4. Enlarging right pleural effusion, with soft tissue nodularity along the right anterior costophrenic angle concerning for pleural based metastases. 5. New lytic lesion within the right anterolateral aspect of the T8 vertebral body, concerning for bony metastasis. 6. 4 cm ascending thoracic aortic aneurysm. Recommend annual imaging  followup by CTA or MRA. This recommendation follows 2010 ACCF/AHA/AATS/ACR/ASA/SCA/SCAI/SIR/STS/SVM Guidelines for the Diagnosis and Management of Patients with Thoracic Aortic Disease. Circulation. 2010; 121: Z610-R604. Aortic aneurysm NOS (ICD10-I71.9) 7.  Aortic Atherosclerosis (ICD10-I70.0). Electronically Signed   By: Bobbye Burrow M.D.   On: 06/17/2023 23:42   DG Chest 2 View Result Date: 06/17/2023 CLINICAL DATA:  cough EXAM: CHEST - 2 VIEW COMPARISON:  Chest x-ray 05/19/2023 FINDINGS: The heart and mediastinal contours are unchanged. Atherosclerotic plaque. Right mid to lower lung zone airspace opacity. No pulmonary edema. Interval development of a moderate volume right pleural effusion. Pleural effusion. No pneumothorax. No acute osseous abnormality. IMPRESSION: 1. Interval development of a moderate volume right pleural effusion. 2. Right mid to lower lung zone airspace opacity. 3.  Aortic Atherosclerosis (ICD10-I70.0). Electronically Signed   By: Morgane  Naveau M.D.   On: 06/17/2023 21:39   CT Head Wo Contrast Result Date: 06/17/2023 CLINICAL DATA:  Delirium Pt had worsening weakness past 2 days where he needed assistance getting off the toilet. Pt fell 1/30. Reports decreased appetite and started on megestro EXAM: CT HEAD WITHOUT CONTRAST TECHNIQUE: Contiguous axial images were obtained from the base of the skull through the vertex without intravenous contrast. RADIATION DOSE REDUCTION: This exam was performed according to the departmental dose-optimization program which includes automated exposure control, adjustment of the mA and/or kV according to patient size and/or use of iterative reconstruction technique. COMPARISON:  None Available. FINDINGS: Brain: Cerebral ventricle sizes are concordant with the degree of cerebral volume loss. Patchy and confluent areas of decreased attenuation are noted throughout the deep and periventricular white matter of the cerebral hemispheres bilaterally,  compatible with chronic microvascular ischemic disease. Age-indeterminate right cerebellar loss of gray-white matter differentiation. No evidence of large-territorial acute infarction. No parenchymal hemorrhage. No mass lesion. No extra-axial collection. No mass effect or midline shift. No hydrocephalus. Basilar cisterns are  patent. Vascular: No hyperdense vessel. Atherosclerotic calcifications are present within the cavernous internal carotid and vertebral arteries. Skull: No acute fracture or focal lesion. Sinuses/Orbits: Paranasal sinuses and mastoid air cells are clear. The orbits are unremarkable. Other: None. IMPRESSION: 1. 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 2. No acute intracranial hemorrhage. These results were called by telephone at the time of interpretation on 06/17/2023 at 9:33 pm to provider Russella Courts , who verbally acknowledged these results. Electronically Signed   By: Morgane  Naveau M.D.   On: 06/17/2023 21:33       PATHOLOGY: Cytology from right pleural fluid is pending.  ASSESSMENT and PLAN:  1.  Right lung mass with mediastinal adenopathy in the bilateral lung nodules: - Highly concerning for pulm neoplasm. - CT chest showed new lytic lesion in the right anterolateral aspect of the T8 vertebral body. - He underwent thoracentesis today 1.4 L removed.  Cytology is pending.  2.  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  Continue to hold Revlimid . - Latest CBC on 06/17/2018 shows hemoglobin 7.  Folic acid  was low at 5.2.  B12 was normal - Will start him on folic acid  1 mg tablet daily and B12 1 mg IM x 1. - Will also give him 1 unit of blood transfusion.  3.  Generalized weakness/mental status changes: - Family reports that he had visual hallucinations in the ER last night.  He keeps repeating questions. - CT head showed age-indeterminate right cerebellar infarct. - Wife and son report that his cough has gotten  worse in the last couple of days.  Consider adding antibiotic empirically for possible pneumonia. - He also has mild hypercalcemia with corrected calcium of 11-11.3. - Will consider adding 1 L normal saline IV daily. - Will discuss with Dr. Alva Jewels.  All questions were answered. The patient knows to call the clinic with any problems, questions or concerns. We can certainly see the patient much sooner if necessary.   Lanee Chain

## 2023-06-18 NOTE — Plan of Care (Signed)
  Problem: Acute Rehab PT Goals(only PT should resolve) Goal: Pt Will Go Supine/Side To Sit Outcome: Progressing Flowsheets (Taken 06/18/2023 1353) Pt will go Supine/Side to Sit:  with contact guard assist  with supervision Goal: Patient Will Transfer Sit To/From Stand Outcome: Progressing Flowsheets (Taken 06/18/2023 1353) Patient will transfer sit to/from stand:  with supervision  with contact guard assist Goal: Pt Will Transfer Bed To Chair/Chair To Bed Outcome: Progressing Flowsheets (Taken 06/18/2023 1353) Pt will Transfer Bed to Chair/Chair to Bed:  with supervision  with contact guard assist Goal: Pt Will Ambulate Outcome: Progressing Flowsheets (Taken 06/18/2023 1353) Pt will Ambulate:  100 feet  with supervision  with contact guard assist  with rolling walker   1:53 PM, 06/18/23 Walton Guppy, MPT Physical Therapist with [facility] 336 [phone] office [phone] mobile phone

## 2023-06-18 NOTE — Progress Notes (Signed)
  INTERVAL PROGRESS NOTE    Aaron Sexton- 78 y.o. male  LOS: 0 __________________________________________________________________  SUBJECTIVE: Admitted 06/17/2023 with cc of  Chief Complaint  Patient presents with   Weakness   Since admission, seen to have pleural effusion and likely metastatic disease. Remains stable ORA.   OBJECTIVE: Blood pressure (!) 138/56, pulse 74, temperature 98.8 F (37.1 C), temperature source Oral, resp. rate 20, height 5\' 7"  (1.702 m), weight 78.5 kg, SpO2 96%.  General: NAD, pleasant, able to participate in exam Cardiac: RRR, normal heart sounds, no murmurs. 2+ radial and PT pulses bilaterally Respiratory: CTAB, normal effort, No wheezes, rales or rhonchi Extremities: no edema. WWP. Skin: warm and dry, no rashes noted Neuro: alert and oriented, no focal deficits Psych: Normal affect and mood   ASSESSMENT/PLAN:  I have reviewed the full H&P by Dr. Elyse Hand, and I agree with the assessment and plan as outlined therein. In addition:  Discussed thoracentesis with IR- planning US  guided procedure today. Order for labs on aspirated fluid placed.   Consulted oncology to initiate treatment plan.   Discussed plan with wife at bedside.    Principal Problem:   Generalized weakness Active Problems:   Myelodysplastic syndrome (HCC)   Essential hypertension, benign   Hyponatremia   Mixed hyperlipidemia   Macrocytic anemia   Failure to thrive in adult   Pleural effusion on right   Multiple lesions of metastatic malignancy (HCC)   Thrombocytopenia (HCC)   Transaminitis      Ree Candy, DO [facility] 06/18/2023, 9:47 AM    [email or web address] Available by Epic secure chat 7AM-7PM. If 7PM-7AM, please contact night-coverage   No Charge

## 2023-06-18 NOTE — H&P (Addendum)
 History and Physical    Patient: Aaron Sexton:[id number] DOB: 1945/08/10 DOA: 06/17/2023 DOS: the patient was seen and examined on 06/18/2023 PCP: Helaine Llanos, MD  Patient coming from: Home  Chief Complaint:  Chief Complaint  Patient presents with   Weakness   HPI: Aaron Sexton is a 78 y.o. 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  Patient usually ambulates with a walker at baseline, but he has not been able to walk.  He presented to electrocardiography last week continue Revlimid  was administered on 2/5.  Patient follows with Dr. Katragadda, per oncology medical record, his appetite has been poor over the last few weeks but rapidly worsening within last few days to 1 week.  He was started on Megace  2/5 for appetite stimulation. Patient has lost over 20 pounds within the last 6 to 7 weeks per son at bedside.  ED Course:  In the emergency department, BP was 130/56, other vital signs are within the normal range.  Workup in the ED showed microcytic anemia, thrombocytopenia.  BMP was normal except for sodium of 129, bicarb 20, glucose 105.  Albumin  2.5, AST 144, ALT 108, ALP 140, troponin 35 > 37.  Urinalysis was normal, lipase 39.  Influenza A, B, SARS, respiratory, RSV was negative. CT chest with contrast presents with: 1. Progressive dense masslike consolidation within the right lower lobe, concerning for underlying neoplasm. Further evaluation with PET scan may be useful if the patient would be a therapy candidate should neoplasm be  detected. 2. Numerous bilateral pulmonary nodules, concerning for metastatic disease. 3. Progressive mediastinal lymphadenopathy, consistent with nodal metastases. 4. Enlarging right pleural effusion, with soft tissue nodularity along the right anterior costophrenic angle concerning for pleural based metastases. 5. New lytic lesion within the right anterolateral aspect of the T8 vertebral body, concerning for bony metastasis. 6. 4 cm ascending thoracic aortic aneurysm Chest x-ray showed interval development of a moderate volume right pleural effusion CT head without contrast showed no acute intracranial hemorrhage and age indeterminate right cerebellar infarction IV hydration with 1 L NS was provided.  Hospitalist was asked admit patient for further evaluation and management.  Review of Systems: Review of systems as noted in the HPI. All other systems reviewed and are negative.   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   Cerebrovascular disease    Moderate ASVD without focal stenosis in 01/2007   Clotting disorder (HCC)    Colon polyps 07/2010   single 2mm polyp--tubular adenoma   Diverticulosis 2012   found on colonoscopy   Erectile dysfunction    Family history of adverse reaction to anesthesia    difficult for son & sistor to wake    GERD (gastroesophageal reflux disease)    Hematochezia 05/24/2010   Hyperlipidemia    Hypertension    Osteoarthritis    knees/hands-Dr Lucey   Overweight(278.02)    Peripheral vascular disease (HCC) 03/11/2010   Moderate SFA stenosis; history of claudication   Pneumonia  Rosacea    Vitamin B12 deficiency    Past Surgical History:  Procedure Laterality Date   APPENDECTOMY     APPLICATION OF WOUND VAC Left 12/17/2021   Procedure: APPLICATION OF PREVENA WOUND VAC LEFT GROIN;  Surgeon: Kayla Part, MD;  Location: [facility] OR;   Service: Vascular;  Laterality: Left;   APPLICATION OF WOUND VAC Left 01/08/2022   Procedure: APPLICATION OF WOUND VAC left groin.;  Surgeon: Young Hensen, MD;  Location: [facility] OR;  Service: Vascular;  Laterality: Left;   COLONOSCOPY W/ POLYPECTOMY  2012   ENDARTERECTOMY FEMORAL Left 12/09/2021   Procedure: LEFT COMMON FEMORAL ENDARTERECTOMY WITH 1 CM X 6 CM XENOSURE BOVINE PATCH ANGIOPLASTY;  Surgeon: Young Hensen, MD;  Location: [facility] OR;  Service: Vascular;  Laterality: Left;   FEMORAL-POPLITEAL BYPASS GRAFT Left 12/09/2021   Procedure: LEFT FEMORAL- ABOVE KNEE POPLITEAL BYPASS WITH 6 mm x 80 cm PROPATEN GRAFT;  Surgeon: Young Hensen, MD;  Location: [facility] OR;  Service: Vascular;  Laterality: Left;  INSERT ARTERIAL LINE   ILIAC VEIN ANGIOPLASTY / STENTING Right 08/09/2015   INCISION AND DRAINAGE OF WOUND Left 01/08/2022   Procedure: IRRIGATION AND DEBRIDEMENT GROIN LEFT application of Myriad Morcells.;  Surgeon: Young Hensen, MD;  Location: [facility] OR;  Service: Vascular;  Laterality: Left;   INSERTION OF ILIAC STENT  12/09/2021   Procedure: INSERTION OF LEFT COMMON AND EXTERNAL  ILIAC STENTS WITH ANGIOPLASTY;  Surgeon: Young Hensen, MD;  Location: [facility] OR;  Service: Vascular;;   LOWER EXTREMITY ANGIOGRAM Left 12/09/2021   Procedure: LEFT ILIAC AND LOWER EXTREMITY ANGIOGRAM;  Surgeon: Young Hensen, MD;  Location: [facility] OR;  Service: Vascular;  Laterality: Left;   LOWER EXTREMITY ANGIOGRAPHY Bilateral 11/03/2021   Procedure: Lower Extremity Angiography;  Surgeon: Avanell Leigh, MD;  Location: [facility] INVASIVE CV LAB;  Service: Cardiovascular;  Laterality: Bilateral;  Limited Study   PERIPHERAL VASCULAR CATHETERIZATION Bilateral 06/21/2015   Procedure: Lower Extremity Angiography;  Surgeon: Avanell Leigh, MD;  Location: [facility] INVASIVE CV LAB;  Service: Cardiovascular;  Laterality: Bilateral;   PERIPHERAL VASCULAR CATHETERIZATION N/A 06/21/2015   Procedure: Abdominal Aortogram;   Surgeon: Avanell Leigh, MD;  Location: [facility] INVASIVE CV LAB;  Service: Cardiovascular;  Laterality: N/A;   PERIPHERAL VASCULAR CATHETERIZATION N/A 07/26/2015   Procedure: Lower Extremity Angiography;  Surgeon: Avanell Leigh, MD;  Location: [facility] INVASIVE CV LAB;  Service: Cardiovascular;  Laterality: N/A;   PERIPHERAL VASCULAR CATHETERIZATION N/A 08/09/2015   Procedure: Lower Extremity Angiography;  Surgeon: Avanell Leigh, MD;  Location: [facility] INVASIVE CV LAB;  Service: Cardiovascular;  Laterality: N/A;   PERIPHERAL VASCULAR CATHETERIZATION  08/09/2015   Procedure: Peripheral Vascular Intervention;  Surgeon: Avanell Leigh, MD;  Location: [facility] INVASIVE CV LAB;  Service: Cardiovascular;;  rt ext. iliac atherectomy and stent   PERIPHERAL VASCULAR CATHETERIZATION N/A 09/20/2015   Procedure: Lower Extremity Angiography;  Surgeon: Avanell Leigh, MD;  Location: [facility] INVASIVE CV LAB;  Service: Cardiovascular;  Laterality: N/A;   PERIPHERAL VASCULAR CATHETERIZATION Right 09/20/2015   Procedure: Peripheral Vascular Intervention;  Surgeon: Avanell Leigh, MD;  Location: [facility] INVASIVE CV LAB;  Service: Cardiovascular;  Laterality: Right;  SFA   ROTATOR CUFF REPAIR Left 7/15   Dr Genevive Ket   WOUND EXPLORATION Left 12/17/2021   Procedure: LEFT LEG WOUND EXPLORATION AND WASH OUT WITH MYRIAD MORCELLS;  Surgeon: Kayla Part, MD;  Location: [facility] OR;  Service: Vascular;  Laterality: Left;    Social  History:  reports that he quit smoking about 33 years ago. His smoking use included cigarettes. He started smoking about 63 years ago. He has a 30 pack-year smoking history. He has never been exposed to tobacco smoke. He has never used smokeless tobacco. He reports that he does not drink alcohol and does not use drugs.   Allergies  Allergen Reactions   Cardizem [Diltiazem Hcl] Hives and Rash    Family History  Problem Relation Age of Onset   Lung cancer Mother    Diabetes Mother    Hypertension Father        And  siblings   Heart disease Father        And second-degree relatives   Transient ischemic attack Father    Brain cancer Sister    Heart disease Sister    Heart disease Sister    Thyroid disease Sister    Diabetes Brother    Atrial fibrillation Brother    Ulcers Son    Diabetes Son    Diabetes Son    Colon cancer Neg Hx    Stomach cancer Neg Hx    Rectal cancer Neg Hx      Prior to Admission medications   Medication Sig Start Date End Date Taking? Authorizing Provider  acetaminophen  (TYLENOL ) 500 MG tablet Take 1,000 mg by mouth every 6 (six) hours as needed for mild pain (pain score 1-3).   Yes [provider]  aspirin  81 MG tablet Take 81 mg by mouth every evening.   Yes [provider]  clopidogrel  (PLAVIX ) 75 MG tablet TAKE 1 TABLET(75 MG) BY MOUTH DAILY WITH BREAKFAST 11/24/22  Yes Branch, Joyceann No, MD  Cyanocobalamin  (VITAMIN B-12) 500 MCG SUBL Place 500 mcg under the tongue daily.   Yes [provider]  diphenhydrAMINE  (BENADRYL ) 25 mg capsule Take 25 mg by mouth every 6 (six) hours as needed for allergies.   Yes [provider]  lenalidomide  (REVLIMID ) 5 MG capsule Take 1 capsule (5 mg total) by mouth daily. TAKE 1 CAPSULE (5MG ) BY MOUTH EVERY DAY 06/13/23  Yes Paulett Boros, MD  loperamide  (IMODIUM  A-D) 2 MG tablet Take 2 mg by mouth 4 (four) times daily as needed for diarrhea or loose stools.   Yes [provider]  losartan  (COZAAR ) 25 MG tablet TAKE 1 TABLET(25 MG) BY MOUTH DAILY 02/01/23  Yes Letvak, Richard I, MD  megestrol  (MEGACE ) 400 MG/10ML suspension Take 10 mLs (400 mg total) by mouth 2 (two) times daily. 06/13/23  Yes Katragadda, Sreedhar, MD  metoprolol  succinate (TOPROL -XL) 100 MG 24 hr tablet TAKE 1 TABLET(100 MG) BY MOUTH DAILY WITH OR IMMEDIATELY FOLLOWING A MEAL 01/01/23  Yes Branch, Joyceann No, MD  nitroGLYCERIN  (NITROSTAT ) 0.4 MG SL tablet Place 1 tablet (0.4 mg total) under the tongue every 5 (five) minutes as  needed for chest pain. 10/12/21  Yes Avanell Leigh, MD  Omega-3 Fatty Acids (FISH OIL) 1000 MG CAPS Take 1,000 mg by mouth daily.   Yes [provider]  omeprazole (PRILOSEC) 20 MG capsule Take 20 mg by mouth every other day.   Yes [provider]  pravastatin  (PRAVACHOL ) 40 MG tablet TAKE 1 TABLET(40 MG) BY MOUTH DAILY 01/01/23  Yes Letvak, Richard I, MD  traMADol  (ULTRAM ) 50 MG tablet TAKE 1 TABLET BY MOUTH EVERY 8 HOURS AS NEEDED Patient taking differently: Take 50 mg by mouth every 8 (eight) hours as needed for moderate pain (pain score 4-6). 04/16/23  Yes Helaine Llanos,  MD    Physical Exam: BP 138/66 (BP Location: Right Arm)   Pulse 72   Temp 97.6 F (36.4 C)   Resp 20   Ht 5\' 7"  (1.702 m)   Wt 78.5 kg   SpO2 97%   BMI 27.10 kg/m   General: 78 y.o. year-old male well developed well nourished in no acute distress.  Alert and oriented x3. HEENT: NCAT, EOMI Neck: Supple, trachea medial Cardiovascular: Regular rate and rhythm with no rubs or gallops.  No thyromegaly or JVD noted.  No lower extremity edema. 2/4 pulses in all 4 extremities. Respiratory: Decreased breath sounds with some rales in right lower lobes. Abdomen: Soft, nontender nondistended with normal bowel sounds x4 quadrants. Muskuloskeletal: No cyanosis, clubbing or edema noted bilaterally Neuro: CN II-XII intact, strength 5/5 x 4, sensation, reflexes intact Skin: No ulcerative lesions noted or rashes Psychiatry: Judgement and insight appear normal. Mood is appropriate for condition and setting          Labs on Admission:  Basic Metabolic Panel: Recent Labs  Lab 06/13/23 1250 06/17/23 1927  NA 129* 129*  K 3.9 3.9  CL 98 99  CO2 22 20*  GLUCOSE 122* 105*  BUN 17 19  CREATININE 1.03 1.10  CALCIUM 9.4 10.0   Liver Function Tests: Recent Labs  Lab 06/13/23 1250 06/17/23 1927  AST 44* 144*  ALT 46* 108*  ALKPHOS 93 140*  BILITOT 0.5 0.7  PROT 7.4 6.9  ALBUMIN  2.8* 2.5*    Recent Labs  Lab 06/17/23 1927  LIPASE 39   Recent Labs  Lab 06/17/23 2052  AMMONIA <10   CBC: Recent Labs  Lab 06/13/23 1250 06/17/23 1927  WBC 4.5 4.3  NEUTROABS 3.0 2.8  HGB 7.1* 7.5*  HCT 21.7* 22.6*  MCV 111.9* 106.1*  PLT 89* 62*   Cardiac Enzymes: No results for input(s): "CKTOTAL", "CKMB", "CKMBINDEX", "TROPONINI" in the last 168 hours.  BNP (last 3 results) No results for input(s): "BNP" in the last 8760 hours.  ProBNP (last 3 results) No results for input(s): "PROBNP" in the last 8760 hours.  CBG: No results for input(s): "GLUCAP" in the last 168 hours.  Radiological Exams on Admission: CT Chest W Contrast Result Date: 06/17/2023 CLINICAL DATA:  Pneumonia, complication suspected, xray done possible right sided pneumonia, persistent x1 month EXAM: CT CHEST WITH CONTRAST TECHNIQUE: Multidetector CT imaging of the chest was performed during intravenous contrast administration. RADIATION DOSE REDUCTION: This exam was performed according to the departmental dose-optimization program which includes automated exposure control, adjustment of the mA and/or kV according to patient size and/or use of iterative reconstruction technique. CONTRAST:  75mL OMNIPAQUE  IOHEXOL  300 MG/ML  SOLN COMPARISON:  06/17/2023, 05/21/2023 FINDINGS: Cardiovascular: The heart is unremarkable without pericardial effusion. 4 cm ascending thoracic aortic aneurysm. No evidence of dissection. Atherosclerosis of the aorta and coronary vasculature. Mediastinum/Nodes: Persistent mediastinal adenopathy, with largest lymph node in the right paratracheal region measuring 2 cm in short axis. Thyroid, trachea, and esophagus are unremarkable. Lungs/Pleura: Moderate right pleural effusion, increased since prior exam. Pleural nodularity within the right anterior costophrenic angle concerning for pleural based metastases. There is progressive dense consolidation within the right lower lobe. The appearance is  somewhat masslike, and underlying neoplasm is suspected. Multiple bilateral pulmonary nodules are again identified, not appreciably changed and suspicious for metastatic disease. Index nodules are as follows: Right middle lobe, 9 mm, image 95/3. Left lower lobe, 9 mm, image 101/3 Left upper lobe, 8 mm, image 40/3. No  pneumothorax. Upper Abdomen: Soft tissue nodularity within the lower anterior mediastinum anterior to the right hemidiaphragm measures up to 1.9 cm, reference image 123/2, suspicious for metastatic disease. Small calcified gallstones are identified without cholecystitis. No other acute upper abdominal findings. Musculoskeletal: There is a lytic lesion within the right anterolateral aspect of the T8 vertebral body, reference image 85/2, new since prior study and concerning for bony metastasis. No other destructive bony abnormalities or acute fractures. Reconstructed images demonstrate no additional findings. IMPRESSION: 1. Progressive dense masslike consolidation within the right lower lobe, concerning for underlying neoplasm. Further evaluation with PET scan may be useful if the patient would be a therapy candidate should neoplasm be detected. 2. Numerous bilateral pulmonary nodules, concerning for metastatic disease. 3. Progressive mediastinal lymphadenopathy, consistent with nodal metastases. 4. Enlarging right pleural effusion, with soft tissue nodularity along the right anterior costophrenic angle concerning for pleural based metastases. 5. New lytic lesion within the right anterolateral aspect of the T8 vertebral body, concerning for bony metastasis. 6. 4 cm ascending thoracic aortic aneurysm. Recommend annual imaging followup by CTA or MRA. This recommendation follows 2010 ACCF/AHA/AATS/ACR/ASA/SCA/SCAI/SIR/STS/SVM Guidelines for the Diagnosis and Management of Patients with Thoracic Aortic Disease. Circulation. 2010; 121: U725-D664. Aortic aneurysm NOS (ICD10-I71.9) 7.  Aortic Atherosclerosis  (ICD10-I70.0). Electronically Signed   By: Bobbye Burrow M.D.   On: 06/17/2023 23:42   DG Chest 2 View Result Date: 06/17/2023 CLINICAL DATA:  cough EXAM: CHEST - 2 VIEW COMPARISON:  Chest x-ray 05/19/2023 FINDINGS: The heart and mediastinal contours are unchanged. Atherosclerotic plaque. Right mid to lower lung zone airspace opacity. No pulmonary edema. Interval development of a moderate volume right pleural effusion. Pleural effusion. No pneumothorax. No acute osseous abnormality. IMPRESSION: 1. Interval development of a moderate volume right pleural effusion. 2. Right mid to lower lung zone airspace opacity. 3.  Aortic Atherosclerosis (ICD10-I70.0). Electronically Signed   By: Morgane  Naveau M.D.   On: 06/17/2023 21:39   CT Head Wo Contrast Result Date: 06/17/2023 CLINICAL DATA:  Delirium Pt had worsening weakness past 2 days where he needed assistance getting off the toilet. Pt fell 1/30. Reports decreased appetite and started on megestro EXAM: CT HEAD WITHOUT CONTRAST TECHNIQUE: Contiguous axial images were obtained from the base of the skull through the vertex without intravenous contrast. RADIATION DOSE REDUCTION: This exam was performed according to the departmental dose-optimization program which includes automated exposure control, adjustment of the mA and/or kV according to patient size and/or use of iterative reconstruction technique. COMPARISON:  None Available. FINDINGS: Brain: Cerebral ventricle sizes are concordant with the degree of cerebral volume loss. Patchy and confluent areas of decreased attenuation are noted throughout the deep and periventricular white matter of the cerebral hemispheres bilaterally, compatible with chronic microvascular ischemic disease. Age-indeterminate right cerebellar loss of gray-white matter differentiation. No evidence of large-territorial acute infarction. No parenchymal hemorrhage. No mass lesion. No extra-axial collection. No mass effect or midline shift. No  hydrocephalus. Basilar cisterns are patent. Vascular: No hyperdense vessel. Atherosclerotic calcifications are present within the cavernous internal carotid and vertebral arteries. Skull: No acute fracture or focal lesion. Sinuses/Orbits: Paranasal sinuses and mastoid air cells are clear. The orbits are unremarkable. Other: None. IMPRESSION: 1. 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 2. No acute intracranial hemorrhage. These results were called by telephone at the time of interpretation on 06/17/2023 at 9:33 pm to provider Russella Courts , who verbally acknowledged these results. Electronically Signed   By: Morgane  Naveau M.D.   On: 06/17/2023 21:33  EKG: I independently viewed the EKG done and my findings are as followed: Normal sinus rhythm at a rate of 83 bpm  Assessment/Plan Present on Admission:  Macrocytic anemia  Hyponatremia  Essential hypertension, benign  Mixed hyperlipidemia  Myelodysplastic syndrome (HCC)  Principal Problem:   Generalized weakness Active Problems:   Myelodysplastic syndrome (HCC)   Essential hypertension, benign   Hyponatremia   Mixed hyperlipidemia   Macrocytic anemia   Failure to thrive in adult   Pleural effusion on right   Multiple lesions of metastatic malignancy (HCC)   Thrombocytopenia (HCC)   Transaminitis  Generalized weakness Failure to thrive in adult Hypoalbuminemia possibly secondary to moderate protein calorie malnutrition Albumin  2.5 Protein supplement will be provided Continue Megace  Dietitian will be consulted Continue PT/OT eval and treat  Right pleural effusion Chest x-ray showed interval development of a moderate volume right pleural effusion IR will be consulted for thoracentesis  Metastatic lesions CT chest with contrast presents with Progressive dense masslike consolidation within the right lower lobe, concerning for underlying neoplasm. Multiple bilateral pulmonary nodules suspected to be metastatic New lytic lesion  within the right anterolateral aspect of the T8 vertebral body, concerning for bony metastasis Patient follows with Dr. Cheree Cords due to MDS.  Consider consult to oncology  Macrocytic anemia MCV 106.1, hemoglobin 7.5 Vitamin B12 and folate levels will be checked  Elevated troponin possibly due to type II demand ischemia Troponin 35 > 37; patient denies chest pain Continue to trend troponin  Chronic thrombocytopenia Platelets 62, continue to monitor  Hyponatremia may be due to dehydration Na 129, IV hydration was provided in the ED Orthostatic BP will be checked to rule out postural hypotension Continue to monitor sodium with serial BMPs Urine osmolality, serum osmolality and urine sodium will be checked  Transaminitis AST 144, ALT 108, ALP 140 This may also be due to metastatic disease RUQ ultrasound in the morning Hepatitis panel will be checked Consider CT abdomen and pelvis  MDS (myelodysplastic syndrome) Hemoglobin at 7.5, this was 7.3 on discharge on 05/23/2023 Patient follows with Dr. Cheree Cords  Essential hypertension Continue losartan   Mixed hyperlipidemia Statin will be held at this time due to elevated liver enzymes  DVT prophylaxis: SCDs  Code Status: Full code  Family Communication: Wife and son at bedside (all questions answered to satisfaction)  Consults: Dietitian   Severity of Illness: The appropriate patient status for this patient is INPATIENT. Inpatient status is judged to be reasonable and necessary in order to provide the required intensity of service to ensure the patient's safety. The patient's presenting symptoms, physical exam findings, and initial radiographic and laboratory data in the context of their chronic comorbidities is felt to place them at high risk for further clinical deterioration. Furthermore, it is not anticipated that the patient will be medically stable for discharge from the hospital within 2 midnights of admission.   * I  certify that at the point of admission it is my clinical judgment that the patient will require inpatient hospital care spanning beyond 2 midnights from the point of admission due to high intensity of service, high risk for further deterioration and high frequency of surveillance required.*  Author: Eustacio Ellen, DO 06/18/2023 4:18 AM  For on call review [email or web address].

## 2023-06-18 NOTE — Plan of Care (Addendum)
 Pt is alert and oriented to person place. Wife at bedside. Pt educated on need for urine specimen. Pt verbalized understanding. Vitals stable. Denies pain.  Problem: Education: Goal: Knowledge of General Education information will improve Description: Including pain rating scale, medication(s)/side effects and non-pharmacologic comfort measures Outcome: Progressing   Problem: Health Behavior/Discharge Planning: Goal: Ability to manage health-related needs will improve Outcome: Progressing   Problem: Clinical Measurements: Goal: Ability to maintain clinical measurements within normal limits will improve Outcome: Progressing Goal: Will remain free from infection Outcome: Progressing Goal: Diagnostic test results will improve Outcome: Progressing Goal: Respiratory complications will improve Outcome: Progressing Goal: Cardiovascular complication will be avoided Outcome: Progressing   Problem: Activity: Goal: Risk for activity intolerance will decrease Outcome: Progressing   Problem: Nutrition: Goal: Adequate nutrition will be maintained Outcome: Progressing   Problem: Coping: Goal: Level of anxiety will decrease Outcome: Progressing   Problem: Elimination: Goal: Will not experience complications related to bowel motility Outcome: Progressing Goal: Will not experience complications related to urinary retention Outcome: Progressing   Problem: Pain Managment: Goal: General experience of comfort will improve and/or be controlled Outcome: Progressing   Problem: Safety: Goal: Ability to remain free from injury will improve Outcome: Progressing   Problem: Skin Integrity: Goal: Risk for impaired skin integrity will decrease Outcome: Progressing

## 2023-06-19 ENCOUNTER — Other Ambulatory Visit (HOSPITAL_COMMUNITY): Payer: Medicare PPO

## 2023-06-19 ENCOUNTER — Encounter (HOSPITAL_COMMUNITY): Payer: Medicare PPO

## 2023-06-19 ENCOUNTER — Ambulatory Visit: Payer: Medicare PPO | Admitting: Vascular Surgery

## 2023-06-19 DIAGNOSIS — E43 Unspecified severe protein-calorie malnutrition: Secondary | ICD-10-CM | POA: Insufficient documentation

## 2023-06-19 DIAGNOSIS — J90 Pleural effusion, not elsewhere classified: Secondary | ICD-10-CM | POA: Diagnosis not present

## 2023-06-19 DIAGNOSIS — R531 Weakness: Secondary | ICD-10-CM | POA: Diagnosis not present

## 2023-06-19 DIAGNOSIS — R918 Other nonspecific abnormal finding of lung field: Secondary | ICD-10-CM

## 2023-06-19 DIAGNOSIS — D469 Myelodysplastic syndrome, unspecified: Secondary | ICD-10-CM | POA: Diagnosis not present

## 2023-06-19 LAB — CBC
HCT: 23.4 % — ABNORMAL LOW (ref 39.0–52.0)
Hemoglobin: 7.7 g/dL — ABNORMAL LOW (ref 13.0–17.0)
MCH: 35 pg — ABNORMAL HIGH (ref 26.0–34.0)
MCHC: 32.9 g/dL (ref 30.0–36.0)
MCV: 106.4 fL — ABNORMAL HIGH (ref 80.0–100.0)
Platelets: 55 10*3/uL — ABNORMAL LOW (ref 150–400)
RBC: 2.2 MIL/uL — ABNORMAL LOW (ref 4.22–5.81)
RDW: 20.6 % — ABNORMAL HIGH (ref 11.5–15.5)
WBC: 4.3 10*3/uL (ref 4.0–10.5)
nRBC: 0 % (ref 0.0–0.2)

## 2023-06-19 LAB — PHOSPHORUS: Phosphorus: 3.2 mg/dL (ref 2.5–4.6)

## 2023-06-19 LAB — MAGNESIUM: Magnesium: 1.9 mg/dL (ref 1.7–2.4)

## 2023-06-19 MED ORDER — CLOPIDOGREL BISULFATE 75 MG PO TABS
75.0000 mg | ORAL_TABLET | Freq: Every day | ORAL | Status: DC
  Administered 2023-06-19 – 2023-06-20 (×2): 75 mg via ORAL
  Filled 2023-06-19 (×2): qty 1

## 2023-06-19 MED ORDER — ASPIRIN 81 MG PO TBEC
81.0000 mg | DELAYED_RELEASE_TABLET | Freq: Every evening | ORAL | Status: DC
  Administered 2023-06-19 – 2023-06-20 (×2): 81 mg via ORAL
  Filled 2023-06-19 (×2): qty 1

## 2023-06-19 MED ORDER — METOPROLOL SUCCINATE ER 25 MG PO TB24
50.0000 mg | ORAL_TABLET | Freq: Every day | ORAL | Status: DC
  Administered 2023-06-19 – 2023-06-20 (×2): 50 mg via ORAL
  Filled 2023-06-19 (×2): qty 2

## 2023-06-19 MED ORDER — PRAVASTATIN SODIUM 40 MG PO TABS
40.0000 mg | ORAL_TABLET | Freq: Every day | ORAL | Status: DC
  Administered 2023-06-19 – 2023-06-20 (×2): 40 mg via ORAL
  Filled 2023-06-19 (×2): qty 1

## 2023-06-19 MED ORDER — ADULT MULTIVITAMIN W/MINERALS CH
1.0000 | ORAL_TABLET | Freq: Every day | ORAL | Status: DC
  Administered 2023-06-19 – 2023-06-20 (×2): 1 via ORAL
  Filled 2023-06-19 (×2): qty 1

## 2023-06-19 MED ORDER — TRAMADOL HCL 50 MG PO TABS: 50.0000 mg | ORAL_TABLET | Freq: Three times a day (TID) | ORAL | Status: DC | PRN

## 2023-06-19 NOTE — Progress Notes (Signed)
Patient requires frequent re-positioning of the body in ways that cannot be achieved with an ordinary bed or wedge pillow, to eliminate pain, reduce pressure, and the head of the bed to be elevated more than 30 degrees most of the time due to metastatic lung disease and cardiovascular disease.

## 2023-06-19 NOTE — Progress Notes (Signed)
Aaron Sexton [facility] Liaison Note: Notified by [facility] manager of patient/family request for [facility] Palliative services at home after discharge. Please call with any hospice or outpatient palliative care related questions. Thank you for the opportunity to participate in this patient's care. Henderson Newcomer [facility] Liaison [phone]

## 2023-06-19 NOTE — Progress Notes (Addendum)
Initial Nutrition Assessment  DOCUMENTATION CODES:   Severe malnutrition in context of acute illness/injury  INTERVENTION:   D/C Ensure, patient dislikes and refuses to drink. Add Magic cup TID with meals, each supplement provides 290 kcal and 9 grams of protein. MVI with minerals daily. Liberalize diet to regular for additional menu options.   NUTRITION DIAGNOSIS:   Severe Malnutrition related to acute illness (R lung mass with lung nodules) as evidenced by moderate muscle depletion, severe muscle depletion, moderate fat depletion, percent weight loss (11% weight loss within one month).  GOAL:   Patient will meet greater than or equal to 90% of their needs  MONITOR:   PO intake, Supplement acceptance  REASON FOR ASSESSMENT:   Consult Assessment of nutrition requirement/status  ASSESSMENT:   78 yo male admitted with generalized weakness, macrocytic anemia, FTT. PMH includes myelodysplastic syndrome, GERD, ASCVD, HLD, CVD, PVD, HTN, B12 deficiency, hereditary hemochromatosis.  CT chest on admission showed R lung mass with lung nodules concerning for pulmonary neoplasm.  Spoke with patient and his wife at bedside. They report that patient has been eating poorly since the beginning of January ~5-6 weeks ago. He endorses a lot of weight loss and decreased strength. His appetite is good, but he just cannot eat very much. He has sausage and eggs or Frosted Flakes and milk for breakfast. The rest of the day he eats minimally. His wife gets him whatever he thinks he can eat, but he only eats a small amount before he is full. He does not like the PO supplements (Ensure Complete and Ensure Clear) that he received from the RD at Dr. Marice Potter office a few weeks ago.   Labs reviewed. Na 128, folate 5.2, iron 43 (06/13/23), Hgb 7.7  Medications reviewed and include megace.  Weight history reviewed. Patient with 11% weight loss in the past month.   Patient meets criteria for severe  malnutrition in the context of acute illness, given moderate depletion of subcutaneous fat mass, moderate-severe depletion of muscle mass, and 11% weight loss x 1 month.  NUTRITION - FOCUSED PHYSICAL EXAM:  Flowsheet Row Most Recent Value  Orbital Region Moderate depletion  Upper Arm Region Moderate depletion  Thoracic and Lumbar Region Moderate depletion  Buccal Region Moderate depletion  Temple Region Moderate depletion  Clavicle Bone Region Severe depletion  Clavicle and Acromion Bone Region Severe depletion  Scapular Bone Region Moderate depletion  Dorsal Hand No depletion  Patellar Region Moderate depletion  Anterior Thigh Region Moderate depletion  Posterior Calf Region Severe depletion  Edema (RD Assessment) None  Hair Reviewed  Eyes Reviewed  Mouth Reviewed  Skin Reviewed  Nails Reviewed       Diet Order:   Diet Order             Diet Heart Room service appropriate? Yes; Fluid consistency: Thin  Diet effective now                   EDUCATION NEEDS:   Education needs have been addressed  Skin:  Skin Assessment: Reviewed RN Assessment  Last BM:  2/9  Height:   Ht Readings from Last 1 Encounters:  06/17/23 5\' 7"  (1.702 m)    Weight:   Wt Readings from Last 1 Encounters:  06/17/23 78.5 kg    Ideal Body Weight:  67.3 kg  BMI:  Body mass index is 27.1 kg/m.  Estimated Nutritional Needs:   Kcal:  2200-2400  Protein:  115-135 gm  Fluid:  2.2-2.4 L  Gabriel Rainwater RD, LDN, CNSC Contact via secure chat. If unavailable, use group chat "RD Inpatient."

## 2023-06-19 NOTE — Progress Notes (Signed)
Bed side commode needed at home as patient confined to one room.

## 2023-06-19 NOTE — Plan of Care (Signed)

## 2023-06-19 NOTE — TOC Initial Note (Addendum)
Transition of Care [facility]) - Initial/Assessment Note    Patient Details  Name: Aaron Sexton MRN: [id number] Date of Birth: [birth year]  Transition of Care [facility]) CM/SW Contact:    Elliot Gault, LCSW Phone Number: 06/19/2023, 1:11 PM  Clinical Narrative:                  Pt medically stable for dc today per MD.   Sherron Monday with pt/wife for dc planning. Pt resides with his wife. They have some help from sons and other family. Pt active with [facility] for [facility].  Pt's wife asking for hospital bed and [facility] for home use. She states they can take pt home and wait on the bed delivery for later in the week once they clear out space. She would like BSC delivered to the room prior to dc. CMS provider options reviewed. Referred to [facility] as requested.  Updated Cory at [place] of pt's dc with new HH orders entered.  Pt's wife states she and her son will transport pt home.  No other TOC needs for dc.  1357: Pt's son here now and stating that they do need the hospital bed in place before they take pt home and that they had been told dc would be at the end of the week. He states that they can make room for the bed at home tonight and accept delivery of the bed tomorrow AM and take pt home after that. Updated MD and RN. Updated [facility] and they will call son to schedule delivery of bed and BSC to the home tomorrow AM.  Referral given to [facility] for outpatient palliative at request of wife and son. Morrie Sheldon from [facility] will contact family.  TOC will follow up in AM.  Expected Discharge Plan: Home w Home Health Services Barriers to Discharge: Barriers Resolved   Patient Goals and CMS Choice Patient states their goals for this hospitalization and ongoing recovery are:: return home CMS Medicare.gov Compare Post Acute Care list provided to:: Patient Represenative (must comment) Choice offered to / list presented to : Spouse      Expected Discharge Plan and Services In-house Referral: Clinical Social  Work   Post Acute Care Choice: Home Health, Durable Medical Equipment Living arrangements for the past 2 months: Single Family Home Expected Discharge Date: 06/19/23               DME Arranged: 3-N-1, Hospital bed DME Agency: [facility] Date DME Agency Contacted: 06/19/23                Prior Living Arrangements/Services Living arrangements for the past 2 months: Single Family Home Lives with:: Spouse Patient language and need for interpreter reviewed:: Yes Do you feel safe going back to the place where you live?: Yes      Need for Family Participation in Patient Care: Yes (Comment) Care giver support system in place?: Yes (comment) Current home services: Home PT Criminal Activity/Legal Involvement Pertinent to Current Situation/Hospitalization: No - Comment as needed  Activities of Daily Living   ADL Screening (condition at time of admission) Independently performs ADLs?: No Does the patient have a NEW difficulty with bathing/dressing/toileting/self-feeding that is expected to last >3 days?: Yes (Initiates electronic notice to provider for possible OT consult) Does the patient have a NEW difficulty with getting in/out of bed, walking, or climbing stairs that is expected to last >3 days?: Yes (Initiates electronic notice to provider for possible PT consult) Does the patient have a NEW difficulty with communication that  is expected to last >3 days?: Yes (Initiates electronic notice to provider for possible SLP consult) Is the patient deaf or have difficulty hearing?: No Does the patient have difficulty seeing, even when wearing glasses/contacts?: No Does the patient have difficulty concentrating, remembering, or making decisions?: Yes  Permission Sought/Granted Permission sought to share information with : Facility Industrial/product designer granted to share information with : Yes, Verbal Permission Granted     Permission granted to share info w AGENCY: DME         Emotional Assessment       Orientation: : Oriented to Self, Oriented to Place, Oriented to  Time, Oriented to Situation Alcohol / Substance Use: Not Applicable Psych Involvement: No (comment)  Admission diagnosis:  Weakness [R53.1] Generalized weakness [R53.1] Patient Active Problem List   Diagnosis Date Noted   Protein-calorie malnutrition, severe 06/19/2023   Lung mass 06/19/2023   Pleural effusion 06/19/2023   Generalized weakness 06/18/2023   Failure to thrive in adult 06/18/2023   Pleural effusion on right 06/18/2023   Multiple lesions of metastatic malignancy (HCC) 06/18/2023   Thrombocytopenia (HCC) 06/18/2023   Transaminitis 06/18/2023   CKD stage 3a, GFR 45-59 ml/min (HCC) - baseline Scr 1.2-1.6 05/23/2023   Hyponatremia 05/23/2023   Hypokalemia 05/23/2023   Community acquired pneumonia 05/19/2023   Right hip pain 05/19/2023   Preventative health care 02/12/2023   Myelodysplastic syndrome (HCC) 01/01/2023   History of colonic polyps 10/27/2022   Essential hypertension, benign 02/09/2022   PAD (peripheral artery disease) (HCC) 12/09/2021   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) 11/29/2021   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   Lobar pneumonia (HCC) 09/07/2017   Carotid arterial disease (HCC) 11/03/2015   Claudication (HCC) 08/09/2015   Advance directive discussed with patient 08/25/2014   Macrocytic anemia 08/19/2013   Antiplatelet or antithrombotic long-term use 08/16/2012   Adenomatous colon polyp    Vitamin B12 deficiency    Arteriosclerotic cardiovascular disease (ASCVD)    Cerebrovascular disease    Peripheral vascular disease (HCC) 03/11/2010   Obesity, unspecified 03/08/2009   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 [place] [address] Phone: [phone] Fax: [phone]  [facility] - [place], [place] - [address] [place] [address] Phone: [phone] Fax: [phone]     Social Drivers of Health (SDOH) Social History: SDOH Screenings   Food Insecurity: No Food Insecurity (06/18/2023)  Housing: Low Risk  (06/18/2023)  Transportation Needs: No Transportation Needs (06/18/2023)  Utilities: Not At Risk (06/18/2023)  Recent Concern: Utilities - At Risk (05/21/2023)  Alcohol Screen: Low Risk  (03/10/2020)  Depression (PHQ2-9): Low Risk  (02/12/2023)  Financial Resource Strain: Low Risk  (03/10/2020)  Physical Activity: Inactive (03/10/2020)  Social Connections: Socially Integrated (06/18/2023)  Stress: No Stress Concern Present (03/10/2020)  Tobacco Use: Medium Risk (06/18/2023)   SDOH Interventions:     Readmission Risk Interventions    01/12/2022   10:42 AM 12/19/2021   10:04 AM 12/13/2021   10:33 AM  Readmission Risk Prevention Plan  Transportation Screening Complete Complete Complete  PCP or Specialist Appt within 5-7 Days Complete Complete Complete  Home Care Screening Complete Complete Complete  Medication Review (RN CM) Complete Complete Complete

## 2023-06-19 NOTE — Discharge Instructions (Addendum)
Your home medications have been continued without any new changes.  Your lab results from the chest fluid are still pending and you will follow up with Dr. Kirtland Bouchard to get these results in their office.  Home health has been ordered so physical therapy and occupational therapy will come to your home.  I have also ordered a bedside commode and hospital bed.

## 2023-06-19 NOTE — Discharge Summary (Signed)
Physician Discharge Summary  Patient: Aaron Sexton ZOX:[id number] DOB: 04/07/1946   Code Status: Full Code Admit date: 06/17/2023 Discharge date: 06/19/2023 Disposition: Home, PT and OT PCP: Karie Schwalbe, MD  Recommendations for Outpatient Follow-up:  Follow up with PCP within 1-2 weeks Regarding general hospital follow up and preventative care Follow up with heme/oncology Hgb 7.7 on day of discharge Final results on pleural aspiration still pending.   Discharge Diagnoses:  Principal Problem:   Generalized weakness Active Problems:   Myelodysplastic syndrome (HCC)   Essential hypertension, benign   Hyponatremia   Mixed hyperlipidemia   Macrocytic anemia   Failure to thrive in adult   Pleural effusion on right   Multiple lesions of metastatic malignancy (HCC)   Thrombocytopenia (HCC)   Transaminitis   Protein-calorie malnutrition, severe  Brief Hospital Course Summary: Aaron Sexton is a 78 y.o. 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 He was started on Megace 2/5 for appetite stimulation. Patient has lost over 20 pounds within the last 6 to 7 weeks per son at bedside.   ED Course: BP was 130/56, other vital signs are within the normal range.  microcytic anemia, thrombocytopenia.  BMP was normal except for sodium of 129, bicarb 20, glucose 105.  Albumin 2.5, AST 144, ALT 108, ALP 140, troponin 35 > 37.  Urinalysis was normal, lipase 39.  Influenza A, B, SARS, respiratory, RSV was negative. CT chest with contrast: 1. Progressive dense masslike consolidation within the right lower lobe, concerning for underlying neoplasm. 2. Numerous bilateral pulmonary nodules, concerning for metastatic disease. 3.  Progressive mediastinal lymphadenopathy, consistent with nodal metastases. 4. Enlarging right pleural effusion, with soft tissue nodularity along the right anterior costophrenic angle concerning for pleural based metastases. 5. New lytic lesion within the right anterolateral aspect of the T8 vertebral body, concerning for bony metastasis. 6. 4 cm ascending thoracic aortic aneurysm  IV hydration with 1 L NS was provided.  Hospitalist was asked admit patient for further evaluation and management.  IR consulted and performed thoracentesis with 1.4L right chest removed. Results are pending.  Oncology was consulted to guide management of presumed malignancy.  Patient remained stable ORA and able to dc home while awaiting the lab results from thoracentesis.  PT/OT evaluated and recommended HH.  Family requested hospital bed at home which was ordered but they said they needed another day to move things around at home to accommodate the bed.  He is discharged in stable condition and awaiting home arrangements to go home.   All other chronic conditions were treated with home medications.    Discharge Condition: Good, improved Recommended discharge diet: Regular healthy diet  Consultations: IR Oncology   Procedures/Studies: Thoracentesis US guided  Discharge Instructions     Discharge patient   Complete by: As directed    Discharge disposition: 06-Home-Health Care Svc   Discharge patient date: 06/19/2023      Allergies as of 06/19/2023       Reactions   Cardizem [diltiazem Hcl] Hives, Rash        Medication List     STOP taking these medications    diphenhydrAMINE 25 mg capsule Commonly known as: BENADRYL       TAKE these medications    acetaminophen 500  MG tablet Commonly known as: TYLENOL Take 1,000 mg by mouth every 6 (six) hours as needed for mild pain (pain score 1-3).   aspirin 81 MG tablet Take 81 mg by mouth every evening.   clopidogrel 75 MG  tablet Commonly known as: PLAVIX TAKE 1 TABLET(75 MG) BY MOUTH DAILY WITH BREAKFAST   Fish Oil 1000 MG Caps Take 1,000 mg by mouth daily.   lenalidomide 5 MG capsule Commonly known as: REVLIMID Take 1 capsule (5 mg total) by mouth daily. TAKE 1 CAPSULE (5MG ) BY MOUTH EVERY DAY   loperamide 2 MG tablet Commonly known as: IMODIUM A-D Take 2 mg by mouth 4 (four) times daily as needed for diarrhea or loose stools.   losartan 25 MG tablet Commonly known as: COZAAR TAKE 1 TABLET(25 MG) BY MOUTH DAILY   megestrol 400 MG/10ML suspension Commonly known as: MEGACE Take 10 mLs (400 mg total) by mouth 2 (two) times daily.   metoprolol succinate 100 MG 24 hr tablet Commonly known as: TOPROL-XL TAKE 1 TABLET(100 MG) BY MOUTH DAILY WITH OR IMMEDIATELY FOLLOWING A MEAL   nitroGLYCERIN 0.4 MG SL tablet Commonly known as: NITROSTAT Place 1 tablet (0.4 mg total) under the tongue every 5 (five) minutes as needed for chest pain.   omeprazole 20 MG capsule Commonly known as: PRILOSEC Take 20 mg by mouth every other day.   pravastatin 40 MG tablet Commonly known as: PRAVACHOL TAKE 1 TABLET(40 MG) BY MOUTH DAILY   traMADol 50 MG tablet Commonly known as: ULTRAM TAKE 1 TABLET BY MOUTH EVERY 8 HOURS AS NEEDED What changed: reasons to take this   Vitamin B-12 500 MCG Subl Place 500 mcg under the tongue daily.               Durable Medical Equipment  (From admission, onward)           Start     Ordered   06/19/23 1247  For home use only DME Hospital bed  Once       Question Answer Comment  Length of Need 12 Months   Bed type Semi-electric      06/19/23 1246   06/18/23 0922  For home use only DME 3 n 1  Once        06/18/23 0921             Subjective   Pt reports feeling well. Denies chest pain, SOB. No palpitations. Able to tolerate diet. Some generalized weakness unchanged from his recent baseline.   All questions and concerns were addressed at time of  discharge of patient and wife at bedside.  Objective  Blood pressure 126/68, pulse 100, temperature 97.9 F (36.6 C), temperature source Oral, resp. rate 18, height 5\' 7"  (1.702 m), weight 78.5 kg, SpO2 97%.   General: Pt is alert, awake, not in acute distress Cardiovascular: RRR, S1/S2 +, no rubs, no gallops Respiratory: CTA bilaterally, no wheezing, no rhonchi. Posterior back without drainage or erythema. Clean bandage over insertion site of thoracentesis.  Abdominal: Soft, NT, ND, bowel sounds + Extremities: no edema, no cyanosis Clean bandage over the sacral area  The results of significant diagnostics from this hospitalization (including imaging, microbiology, ancillary and laboratory) are listed below for reference.   Imaging studies: US Abdomen Limited Result Date: 06/18/2023 CLINICAL DATA:  Transaminitis EXAM: ULTRASOUND ABDOMEN LIMITED RIGHT UPPER QUADRANT COMPARISON:  05/19/2023 FINDINGS: Gallbladder: No gallstones or wall thickening visualized. No sonographic Murphy sign noted by sonographer. Common bile duct: Diameter:  3 mm Liver: No focal lesion identified. Within normal limits in parenchymal echogenicity. Portal vein is patent on color Doppler imaging with normal direction of blood flow towards the liver. Other: Incidental right pleural effusion. IMPRESSION: 1. Incidental right pleural effusion. 2. Otherwise unremarkable right upper quadrant ultrasound. Electronically Signed   By: Sharlet Salina M.D.   On: 06/18/2023 16:02   US THORACENTESIS ASP PLEURAL SPACE W/IMG GUIDE Result Date: 06/18/2023 INDICATION: Symptomatic right sided pleural effusion EXAM: US THORACENTESIS ASP PLEURAL SPACE W/IMG GUIDE COMPARISON:  Chest CT-06/17/2023 MEDICATIONS: None. COMPLICATIONS: None immediate. TECHNIQUE: Informed written consent was obtained from the patient after a discussion of the risks, benefits and alternatives to treatment. A timeout was performed prior to the initiation of the procedure.  Initial ultrasound scanning demonstrates a large right-sided pleural effusion. The lower chest was prepped and draped in the usual sterile fashion. 1% lidocaine was used for local anesthesia. An ultrasound image was saved for documentation purposes. An 8 Fr Safe-T-Centesis catheter was introduced. The thoracentesis was performed. The catheter was removed and a dressing was applied. The patient tolerated the procedure well without immediate post procedural complication. The patient was escorted to have an upright chest radiograph. FINDINGS: A total of approximately 1.4 liters of serous fluid was removed. Requested samples were sent to the laboratory. IMPRESSION: Successful ultrasound-guided right sided thoracentesis yielding 1.4 liters of pleural fluid. Electronically Signed   By: Simonne Come M.D.   On: 06/18/2023 12:31   DG Chest 1 View Result Date: 06/18/2023 CLINICAL DATA:  Post right-sided thoracentesis EXAM: CHEST  1 VIEW COMPARISON:  06/17/2023; chest CT-06/17/2023 FINDINGS: Unchanged enlarged cardiac silhouette and mediastinal contours with atherosclerotic plaque thoracic aorta. Interval reduction/near resolution of right-sided pleural effusion post thoracentesis with aeration base. No pneumothorax. Pulmonary vasculature remains indistinct with cephalization of flow. Suspected trace left-sided pleural effusion. No acute osseous abnormalities. IMPRESSION: 1. Interval reduction/near resolution of right-sided pleural effusion post thoracentesis with aeration of the base. No pneumothorax. 2. Similar findings of cardiomegaly and pulmonary edema with suspected trace left-sided pleural effusion. Electronically Signed   By: Simonne Come M.D.   On: 06/18/2023 12:30   CT Chest W Contrast Result Date: 06/17/2023 CLINICAL DATA:  Pneumonia, complication suspected, xray done possible right sided pneumonia, persistent x1 month EXAM: CT CHEST WITH CONTRAST TECHNIQUE: Multidetector CT imaging of the chest was performed  during intravenous contrast administration. RADIATION DOSE REDUCTION: This exam was performed according to the departmental dose-optimization program which includes automated exposure control, adjustment of the mA and/or kV according to patient size and/or use of iterative reconstruction technique. CONTRAST:  75mL OMNIPAQUE IOHEXOL 300 MG/ML  SOLN COMPARISON:  06/17/2023, 05/21/2023 FINDINGS: Cardiovascular: The heart is unremarkable without pericardial effusion. 4 cm ascending thoracic aortic aneurysm. No evidence of dissection. Atherosclerosis of the aorta and coronary vasculature. Mediastinum/Nodes: Persistent mediastinal adenopathy, with largest lymph node in the right paratracheal region measuring 2 cm in short axis. Thyroid, trachea, and esophagus are unremarkable. Lungs/Pleura: Moderate right pleural effusion, increased since prior exam. Pleural nodularity within the right anterior costophrenic angle concerning for pleural based metastases. There is progressive dense consolidation within the right lower lobe. The appearance is somewhat masslike, and underlying neoplasm is suspected. Multiple bilateral pulmonary nodules are again identified, not appreciably changed and suspicious for metastatic disease. Index nodules are as follows: Right middle lobe, 9 mm, image 95/3. Left lower lobe, 9 mm, image 101/3 Left upper lobe, 8 mm, image 40/3. No pneumothorax. Upper Abdomen: Soft tissue nodularity within the lower  anterior mediastinum anterior to the right hemidiaphragm measures up to 1.9 cm, reference image 123/2, suspicious for metastatic disease. Small calcified gallstones are identified without cholecystitis. No other acute upper abdominal findings. Musculoskeletal: There is a lytic lesion within the right anterolateral aspect of the T8 vertebral body, reference image 85/2, new since prior study and concerning for bony metastasis. No other destructive bony abnormalities or acute fractures. Reconstructed images  demonstrate no additional findings. IMPRESSION: 1. Progressive dense masslike consolidation within the right lower lobe, concerning for underlying neoplasm. Further evaluation with PET scan may be useful if the patient would be a therapy candidate should neoplasm be detected. 2. Numerous bilateral pulmonary nodules, concerning for metastatic disease. 3. Progressive mediastinal lymphadenopathy, consistent with nodal metastases. 4. Enlarging right pleural effusion, with soft tissue nodularity along the right anterior costophrenic angle concerning for pleural based metastases. 5. New lytic lesion within the right anterolateral aspect of the T8 vertebral body, concerning for bony metastasis. 6. 4 cm ascending thoracic aortic aneurysm. Recommend annual imaging followup by CTA or MRA. This recommendation follows 2010 ACCF/AHA/AATS/ACR/ASA/SCA/SCAI/SIR/STS/SVM Guidelines for the Diagnosis and Management of Patients with Thoracic Aortic Disease. Circulation. 2010; 121: Z610-R604. Aortic aneurysm NOS (ICD10-I71.9) 7.  Aortic Atherosclerosis (ICD10-I70.0). Electronically Signed   By: Sharlet Salina M.D.   On: 06/17/2023 23:42   DG Chest 2 View Result Date: 06/17/2023 CLINICAL DATA:  cough EXAM: CHEST - 2 VIEW COMPARISON:  Chest x-ray 05/19/2023 FINDINGS: The heart and mediastinal contours are unchanged. Atherosclerotic plaque. Right mid to lower lung zone airspace opacity. No pulmonary edema. Interval development of a moderate volume right pleural effusion. Pleural effusion. No pneumothorax. No acute osseous abnormality. IMPRESSION: 1. Interval development of a moderate volume right pleural effusion. 2. Right mid to lower lung zone airspace opacity. 3.  Aortic Atherosclerosis (ICD10-I70.0). Electronically Signed   By: Tish Frederickson M.D.   On: 06/17/2023 21:39   CT Head Wo Contrast Result Date: 06/17/2023 CLINICAL DATA:  Delirium Pt had worsening weakness past 2 days where he needed assistance getting off the toilet. Pt  fell 1/30. Reports decreased appetite and started on megestro EXAM: CT HEAD WITHOUT CONTRAST TECHNIQUE: Contiguous axial images were obtained from the base of the skull through the vertex without intravenous contrast. RADIATION DOSE REDUCTION: This exam was performed according to the departmental dose-optimization program which includes automated exposure control, adjustment of the mA and/or kV according to patient size and/or use of iterative reconstruction technique. COMPARISON:  None Available. FINDINGS: Brain: Cerebral ventricle sizes are concordant with the degree of cerebral volume loss. Patchy and confluent areas of decreased attenuation are noted throughout the deep and periventricular white matter of the cerebral hemispheres bilaterally, compatible with chronic microvascular ischemic disease. Age-indeterminate right cerebellar loss of gray-white matter differentiation. No evidence of large-territorial acute infarction. No parenchymal hemorrhage. No mass lesion. No extra-axial collection. No mass effect or midline shift. No hydrocephalus. Basilar cisterns are patent. Vascular: No hyperdense vessel. Atherosclerotic calcifications are present within the cavernous internal carotid and vertebral arteries. Skull: No acute fracture or focal lesion. Sinuses/Orbits: Paranasal sinuses and mastoid air cells are clear. The orbits are unremarkable. Other: None. IMPRESSION: 1. 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 2. No acute intracranial hemorrhage. These results were called by telephone at the time of interpretation on 06/17/2023 at 9:33 pm to provider Tanda Rockers , who verbally acknowledged these results. Electronically Signed   By: Tish Frederickson M.D.   On: 06/17/2023 21:33   [place] CHEST (PLEURAL EFFUSION) Result Date: 05/22/2023 INDICATION: History  of worsening shortness of breath. Found to have a small right-sided pleural effusion. Request is for therapeutic and diagnostic right-sided thoracentesis EXAM:  CHEST ULTRASOUND COMPARISON:  CT angio chest dated May 21, 2023 FINDINGS: Small right-sided pleural effusion. IMPRESSION: Small left-sided pleural effusion. After discussion of the risks versus benefits of the procedure the patient elected to defer the thoracentesis at this time. Read by: Anders Grant, NP Electronically Signed   By: Judie Petit.  Shick M.D.   On: 05/22/2023 11:10   CT Angio Chest Pulmonary Embolism (PE) W or WO Contrast Result Date: 05/21/2023 CLINICAL DATA:  Worsening shortness of breath and tachypnea this morning. Pulmonary embolism suspected. Possible necrotizing pneumonia. EXAM: CT ANGIOGRAPHY CHEST WITH CONTRAST TECHNIQUE: Multidetector CT imaging of the chest was performed using the standard protocol during bolus administration of intravenous contrast. Multiplanar CT image reconstructions and MIPs were obtained to evaluate the vascular anatomy. RADIATION DOSE REDUCTION: This exam was performed according to the departmental dose-optimization program which includes automated exposure control, adjustment of the mA and/or kV according to patient size and/or use of iterative reconstruction technique. CONTRAST:  75mL OMNIPAQUE IOHEXOL 350 MG/ML SOLN COMPARISON:  Chest radiographs and abdominal CT 05/19/2023. No prior chest CT. FINDINGS: Cardiovascular: The pulmonary arteries are well opacified with contrast to the level of the segmental branches. There is no evidence of acute pulmonary embolism. No acute systemic arterial abnormalities are identified. There is atherosclerosis of the aorta, great vessels and coronary arteries. The heart size is normal. There is no pericardial effusion. Mediastinum/Nodes: Mildly enlarged right paratracheal node measures 1.5 cm short axis on image 114/5. There are prominent right hilar lymph nodes measuring up to 1 cm in diameter. There is right pericardiac nodularity which may reflect small lymph nodes or pleural base nodules. No other enlarged mediastinal or  axillary lymph nodes are seen. The thyroid gland, trachea and esophagus demonstrate no significant findings. Lungs/Pleura: Small dependent right pleural effusion has increased in volume compared with the recent abdominal CT. Interval increased volume loss in the right lower lobe with associated consolidation or ill-defined mass, slightly increased from the recent prior study. There are scattered small pulmonary nodules bilaterally which are primarily subpleural in location, including a 6 mm right middle lobe nodule on image 81/6, a 6 mm lingular nodule on image 82/6 and a 7 mm left lower lobe nodule on image 100/6, unchanged from recent abdominal CT. Additional scattered nodules are present measuring up to 6 mm anteriorly in the left upper lobe on image 37/6. Upper abdomen: No acute findings are seen in the visualized upper abdomen. Musculoskeletal/Chest wall: There is no chest wall mass or suspicious osseous finding. Mild multilevel spondylosis. Review of the MIP images confirms the above findings. IMPRESSION: 1. No evidence of acute pulmonary embolism or other acute vascular findings in the chest. 2. Interval increased volume loss in the right lower lobe with associated consolidation and enlarging adjacent pleural effusion, suspicious for pneumonia. Radiographic follow-up recommended to ensure resolution. 3. Scattered small pulmonary nodules bilaterally, primarily subpleural in location, nonspecific. Metastatic disease not excluded. Non-contrast chest CT at 3-6 months is recommended. If the nodules are stable at time of repeat CT, then future CT at 18-24 months (from today's scan) is considered optional for low-risk patients, but is recommended for high-risk patients. This recommendation follows the consensus statement: Guidelines for Management of Incidental Pulmonary Nodules Detected on CT Images: From the Fleischner Society 2017; Radiology 2017; 284:228-243. 4. Mildly enlarged right paratracheal and right hilar  lymph nodes, also nonspecific  and potentially reactive. Recommend attention on follow-up CT. 5.  Aortic Atherosclerosis (ICD10-I70.0). Electronically Signed   By: Carey Bullocks M.D.   On: 05/21/2023 11:39    Labs: Basic Metabolic Panel: Recent Labs  Lab 06/13/23 1250 06/17/23 1927 06/18/23 0405 06/19/23 0426  NA 129* 129* 128*  --   K 3.9 3.9 3.8  --   CL 98 99 101  --   CO2 22 20* 20*  --   GLUCOSE 122* 105* 85  --   BUN 17 19 17   --   CREATININE 1.03 1.10 0.95  --   CALCIUM 9.4 10.0 9.7  --   MG  --   --   --  1.9  PHOS  --   --   --  3.2   CBC: Recent Labs  Lab 06/13/23 1250 06/17/23 1927 06/18/23 0405 06/19/23 0426  WBC 4.5 4.3 3.7* 4.3  NEUTROABS 3.0 2.8  --   --   HGB 7.1* 7.5* 7.0* 7.7*  HCT 21.7* 22.6* 21.0* 23.4*  MCV 111.9* 106.1* 107.7* 106.4*  PLT 89* 62* 57* 55*   Microbiology: Results for orders placed or performed during the hospital encounter of 06/17/23  Resp panel by RT-PCR (RSV, Flu A&B, Covid) Anterior Nasal Swab     Status: None   Collection Time: 06/17/23  7:26 PM   Specimen: Anterior Nasal Swab  Result Value Ref Range Status   SARS Coronavirus 2 by RT PCR NEGATIVE NEGATIVE Final    Comment: (NOTE) SARS-CoV-2 target nucleic acids are NOT DETECTED.  The SARS-CoV-2 RNA is generally detectable in upper respiratory specimens during the acute phase of infection. The lowest concentration of SARS-CoV-2 viral copies this assay can detect is 138 copies/mL. A negative result does not preclude SARS-Cov-2 infection and should not be used as the sole basis for treatment or other patient management decisions. A negative result may occur with  improper specimen collection/handling, submission of specimen other than nasopharyngeal swab, presence of viral mutation(s) within the areas targeted by this assay, and inadequate number of viral copies(<138 copies/mL). A negative result must be combined with clinical observations, patient history, and  epidemiological information. The expected result is Negative.  Fact Sheet for Patients:  BloggerCourse.com  Fact Sheet for Healthcare Providers:  SeriousBroker.it  This test is no t yet approved or cleared by the [place] FDA and  has been authorized for detection and/or diagnosis of SARS-CoV-2 by FDA under an Emergency Use Authorization (EUA). This EUA will remain  in effect (meaning this test can be used) for the duration of the COVID-19 declaration under Section 564(b)(1) of the Act, 21 U.S.C.section 360bbb-3(b)(1), unless the authorization is terminated  or revoked sooner.       Influenza A by PCR NEGATIVE NEGATIVE Final   Influenza B by PCR NEGATIVE NEGATIVE Final    Comment: (NOTE) The Xpert Xpress SARS-CoV-2/FLU/RSV plus assay is intended as an aid in the diagnosis of influenza from Nasopharyngeal swab specimens and should not be used as a sole basis for treatment. Nasal washings and aspirates are unacceptable for Xpert Xpress SARS-CoV-2/FLU/RSV testing.  Fact Sheet for Patients: BloggerCourse.com  Fact Sheet for Healthcare Providers: SeriousBroker.it  This test is not yet approved or cleared by the [place] FDA and has been authorized for detection and/or diagnosis of SARS-CoV-2 by FDA under an Emergency Use Authorization (EUA). This EUA will remain in effect (meaning this test can be used) for the duration of the COVID-19 declaration under Section 564(b)(1) of the  Act, 21 U.S.C. section 360bbb-3(b)(1), unless the authorization is terminated or revoked.     Resp Syncytial Virus by PCR NEGATIVE NEGATIVE Final    Comment: (NOTE) Fact Sheet for Patients: BloggerCourse.com  Fact Sheet for Healthcare Providers: SeriousBroker.it  This test is not yet approved or cleared by the [place] FDA and has been  authorized for detection and/or diagnosis of SARS-CoV-2 by FDA under an Emergency Use Authorization (EUA). This EUA will remain in effect (meaning this test can be used) for the duration of the COVID-19 declaration under Section 564(b)(1) of the Act, 21 U.S.C. section 360bbb-3(b)(1), unless the authorization is terminated or revoked.  Performed at [facility], [address], [place], [place] [address]     Time coordinating discharge: Over 30 minutes  Leeroy Bock, MD  [facility] 06/19/2023, 12:47 PM

## 2023-06-19 NOTE — Consult Note (Signed)
[facility] Oncology Progress Note  Name: Aaron Sexton      MRN: [id number]    Location: [id number]/[id number]  Date: 06/19/2023 Time:4:16 PM   Subjective: Interval History:Aaron Sexton is seen this morning.  He is lying in bed.  He reports that he feels slightly better.  He still has some cough but very limited expectoration.  He had a light breakfast this morning.  Objective: Vital signs in last 24 hours: Temp:  [97.8 F (36.6 C)-98.9 F (37.2 C)] 98.9 F (37.2 C) (02/11 1327) Pulse Rate:  [86-100] 86 (02/11 1327) Resp:  [16-24] 18 (02/11 1327) BP: (126-140)/(58-73) 140/73 (02/11 1327) SpO2:  [97 %] 97 % (02/11 1327)    Intake/Output from previous day: 02/10 0800 - 02/11 0759 In: 1260 [P.O.:1260] Out: 900 [Urine:900]    Intake/Output this shift: Total I/O In: 470 [P.O.:470] Out: -    PHYSICAL EXAM: BP (!) 140/73 (BP Location: Right Arm)   Pulse 86   Temp 98.9 F (37.2 C)   Resp 18   Ht 5\' 7"  (1.702 m)   Wt 173 lb (78.5 kg)   SpO2 97%   BMI 27.10 kg/m  General appearance: alert, cooperative, and appears stated age Extremities:  No edema Neurologic: Grossly normal   Studies/Results: Results for orders placed or performed during the hospital encounter of 06/17/23 (from the past 48 hours)  Urinalysis, Routine w reflex microscopic -Urine, Clean Catch     Status: Abnormal   Collection Time: 06/17/23  7:15 PM  Result Value Ref Range   Color, Urine YELLOW YELLOW   APPearance CLEAR CLEAR   Specific Gravity, Urine 1.019 1.005 - 1.030   pH 5.0 5.0 - 8.0   Glucose, UA NEGATIVE NEGATIVE mg/dL   Hgb urine dipstick NEGATIVE NEGATIVE   Bilirubin Urine NEGATIVE NEGATIVE   Ketones, ur NEGATIVE NEGATIVE mg/dL   Protein, ur 30 (A) NEGATIVE mg/dL   Nitrite NEGATIVE NEGATIVE   Leukocytes,Ua NEGATIVE NEGATIVE   RBC / HPF 0-5 0 - 5 RBC/hpf   WBC, UA 0-5 0 - 5 WBC/hpf   Bacteria, UA NONE SEEN NONE SEEN   Squamous Epithelial / HPF 0-5 0 - 5 /HPF   Mucus PRESENT      Comment: Performed at [facility], [address] [place], [place] [address]  Osmolality, urine     Status: None   Collection Time: 06/17/23  7:15 PM  Result Value Ref Range   Osmolality, Ur 686 300 - 900 mOsm/kg    Comment: REPEATED TO VERIFY Performed at [facility], [address] [place], [place] [address]   Sodium, urine, random     Status: None   Collection Time: 06/17/23  7:15 PM  Result Value Ref Range   Sodium, Ur 76 mmol/L    Comment: Performed at [facility], [address] [place], [place] [address]  Resp panel by RT-PCR (RSV, Flu A&B, Covid) Anterior Nasal Swab     Status: None   Collection Time: 06/17/23  7:26 PM   Specimen: Anterior Nasal Swab  Result Value Ref Range   SARS Coronavirus 2 by RT PCR NEGATIVE NEGATIVE    Comment: (NOTE) SARS-CoV-2 target nucleic acids are NOT DETECTED.  The SARS-CoV-2 RNA is generally detectable in upper respiratory specimens during the acute phase of infection. The lowest concentration of SARS-CoV-2 viral copies this assay can detect is 138 copies/mL. A negative result does not preclude SARS-Cov-2 infection and should not be used as the sole basis for  treatment or other patient management decisions. A negative result may occur with  improper specimen collection/handling, submission of specimen other than nasopharyngeal swab, presence of viral mutation(s) within the areas targeted by this assay, and inadequate number of viral copies(<138 copies/mL). A negative result must be combined with clinical observations, patient history, and epidemiological information. The expected result is Negative.  Fact Sheet for Patients:  BloggerCourse.com  Fact Sheet for Healthcare Providers:  SeriousBroker.it  This test is no t yet approved or cleared by the [place] FDA and  has been authorized for detection and/or diagnosis of SARS-CoV-2 by FDA under an Emergency Use Authorization  (EUA). This EUA will remain  in effect (meaning this test can be used) for the duration of the COVID-19 declaration under Section 564(b)(1) of the Act, 21 U.S.C.section 360bbb-3(b)(1), unless the authorization is terminated  or revoked sooner.       Influenza A by PCR NEGATIVE NEGATIVE   Influenza B by PCR NEGATIVE NEGATIVE    Comment: (NOTE) The Xpert Xpress SARS-CoV-2/FLU/RSV plus assay is intended as an aid in the diagnosis of influenza from Nasopharyngeal swab specimens and should not be used as a sole basis for treatment. Nasal washings and aspirates are unacceptable for Xpert Xpress SARS-CoV-2/FLU/RSV testing.  Fact Sheet for Patients: BloggerCourse.com  Fact Sheet for Healthcare Providers: SeriousBroker.it  This test is not yet approved or cleared by the [place] FDA and has been authorized for detection and/or diagnosis of SARS-CoV-2 by FDA under an Emergency Use Authorization (EUA). This EUA will remain in effect (meaning this test can be used) for the duration of the COVID-19 declaration under Section 564(b)(1) of the Act, 21 U.S.C. section 360bbb-3(b)(1), unless the authorization is terminated or revoked.     Resp Syncytial Virus by PCR NEGATIVE NEGATIVE    Comment: (NOTE) Fact Sheet for Patients: BloggerCourse.com  Fact Sheet for Healthcare Providers: SeriousBroker.it  This test is not yet approved or cleared by the [place] FDA and has been authorized for detection and/or diagnosis of SARS-CoV-2 by FDA under an Emergency Use Authorization (EUA). This EUA will remain in effect (meaning this test can be used) for the duration of the COVID-19 declaration under Section 564(b)(1) of the Act, 21 U.S.C. section 360bbb-3(b)(1), unless the authorization is terminated or revoked.  Performed at [facility], [address], [place], [place] [address]   CBC  with Differential     Status: Abnormal   Collection Time: 06/17/23  7:27 PM  Result Value Ref Range   WBC 4.3 4.0 - 10.5 K/uL   RBC 2.13 (L) 4.22 - 5.81 MIL/uL   Hemoglobin 7.5 (L) 13.0 - 17.0 g/dL   HCT 91.4 (L) 78.2 - 95.6 %   MCV 106.1 (H) 80.0 - 100.0 fL   MCH 35.2 (H) 26.0 - 34.0 pg   MCHC 33.2 30.0 - 36.0 g/dL   RDW 21.3 (H) 08.6 - 57.8 %   Platelets 62 (L) 150 - 400 K/uL    Comment: SPECIMEN CHECKED FOR CLOTS REPEATED TO VERIFY    nRBC 0.0 0.0 - 0.2 %   Neutrophils Relative % 65 %   Neutro Abs 2.8 1.7 - 7.7 K/uL   Lymphocytes Relative 26 %   Lymphs Abs 1.1 0.7 - 4.0 K/uL   Monocytes Relative 7 %   Monocytes Absolute 0.3 0.1 - 1.0 K/uL   Eosinophils Relative 1 %   Eosinophils Absolute 0.1 0.0 - 0.5 K/uL   Basophils Relative 0 %   Basophils Absolute 0.0  0.0 - 0.1 K/uL   Immature Granulocytes 1 %   Abs Immature Granulocytes 0.02 0.00 - 0.07 K/uL    Comment: Performed at [facility], [address], [place], [place] [address]  Comprehensive metabolic panel     Status: Abnormal   Collection Time: 06/17/23  7:27 PM  Result Value Ref Range   Sodium 129 (L) 135 - 145 mmol/L   Potassium 3.9 3.5 - 5.1 mmol/L   Chloride 99 98 - 111 mmol/L   CO2 20 (L) 22 - 32 mmol/L   Glucose, Bld 105 (H) 70 - 99 mg/dL    Comment: Glucose reference range applies only to samples taken after fasting for at least 8 hours.   BUN 19 8 - 23 mg/dL   Creatinine, Ser 6.04 0.61 - 1.24 mg/dL   Calcium 54.0 8.9 - 98.1 mg/dL   Total Protein 6.9 6.5 - 8.1 g/dL   Albumin 2.5 (L) 3.5 - 5.0 g/dL   AST 191 (H) 15 - 41 U/L   ALT 108 (H) 0 - 44 U/L   Alkaline Phosphatase 140 (H) 38 - 126 U/L   Total Bilirubin 0.7 0.0 - 1.2 mg/dL   GFR, Estimated >47 >82 mL/min    Comment: (NOTE) Calculated using the CKD-EPI Creatinine Equation (2021)    Anion gap 10 5 - 15    Comment: Performed at [facility], [address] [place], [place] [address]  Lipase, blood     Status: None   Collection Time: 06/17/23  7:27 PM   Result Value Ref Range   Lipase 39 11 - 51 U/L    Comment: Performed at [facility], [address] [place], [place] [address]  Troponin I (High Sensitivity)     Status: Abnormal   Collection Time: 06/17/23  7:27 PM  Result Value Ref Range   Troponin I (High Sensitivity) 35 (H) <18 ng/L    Comment: (NOTE) Elevated high sensitivity troponin I (hsTnI) values and significant  changes across serial measurements may suggest ACS but many other  chronic and acute conditions are known to elevate hsTnI results.  Refer to the "Links" section for chest pain algorithms and additional  guidance. Performed at [facility], [address] [place], [place] [address]   Ammonia     Status: None   Collection Time: 06/17/23  8:52 PM  Result Value Ref Range   Ammonia <10 9 - 35 umol/L    Comment: Performed at [facility], [address] [place], [place] [address]  Troponin I (High Sensitivity)     Status: Abnormal   Collection Time: 06/17/23  9:03 PM  Result Value Ref Range   Troponin I (High Sensitivity) 37 (H) <18 ng/L    Comment: (NOTE) Elevated high sensitivity troponin I (hsTnI) values and significant  changes across serial measurements may suggest ACS but many other  chronic and acute conditions are known to elevate hsTnI results.  Refer to the "Links" section for chest pain algorithms and additional  guidance. Performed at [facility], [address] [place], [place] [address]   Vitamin B12     Status: None   Collection Time: 06/18/23  4:05 AM  Result Value Ref Range   Vitamin B-12 291 180 - 914 pg/mL    Comment: (NOTE) This assay is not validated for testing neonatal or myeloproliferative syndrome specimens for Vitamin B12 levels. Performed at [facility], [address], [place], [place] [address]   Folate     Status: Abnormal  Collection Time: 06/18/23  4:05 AM  Result Value Ref Range   Folate 5.2 (L) >5.9 ng/mL    Comment: Performed at [facility], [address]  [place], [place] [address]  Osmolality     Status: None   Collection Time: 06/18/23  4:05 AM  Result Value Ref Range   Osmolality 282 275 - 295 mOsm/kg    Comment: Performed at [facility], [address], [place], [place] [address]  Hepatitis panel, acute     Status: None   Collection Time: 06/18/23  4:05 AM  Result Value Ref Range   Hepatitis B Surface Ag NON REACTIVE NON REACTIVE   HCV Ab NON REACTIVE NON REACTIVE    Comment: (NOTE) Nonreactive HCV antibody screen is consistent with no HCV infections,  unless recent infection is suspected or other evidence exists to indicate HCV infection.     Hep A IgM NON REACTIVE NON REACTIVE   Hep B C IgM NON REACTIVE NON REACTIVE    Comment: Performed at [facility], [address] [place], [place] [address]  Comprehensive metabolic panel     Status: Abnormal   Collection Time: 06/18/23  4:05 AM  Result Value Ref Range   Sodium 128 (L) 135 - 145 mmol/L   Potassium 3.8 3.5 - 5.1 mmol/L   Chloride 101 98 - 111 mmol/L   CO2 20 (L) 22 - 32 mmol/L   Glucose, Bld 85 70 - 99 mg/dL    Comment: Glucose reference range applies only to samples taken after fasting for at least 8 hours.   BUN 17 8 - 23 mg/dL   Creatinine, Ser 6.96 0.61 - 1.24 mg/dL   Calcium 9.7 8.9 - 29.5 mg/dL   Total Protein 6.3 (L) 6.5 - 8.1 g/dL   Albumin 2.4 (L) 3.5 - 5.0 g/dL   AST 85 (H) 15 - 41 U/L   ALT 85 (H) 0 - 44 U/L   Alkaline Phosphatase 123 38 - 126 U/L   Total Bilirubin 0.6 0.0 - 1.2 mg/dL   GFR, Estimated >28 >41 mL/min    Comment: (NOTE) Calculated using the CKD-EPI Creatinine Equation (2021)    Anion gap 7 5 - 15    Comment: Performed at [facility], [address] [place], [place] [address]  CBC     Status: Abnormal   Collection Time: 06/18/23  4:05 AM  Result Value Ref Range   WBC 3.7 (L) 4.0 - 10.5 K/uL   RBC 1.95 (L) 4.22 - 5.81 MIL/uL   Hemoglobin 7.0 (L) 13.0 - 17.0 g/dL   HCT 10.2 (L) 72.5 - 36.6 %   MCV 107.7 (H) 80.0 - 100.0 fL    MCH 35.9 (H) 26.0 - 34.0 pg   MCHC 33.3 30.0 - 36.0 g/dL   RDW 44.0 (H) 34.7 - 42.5 %   Platelets 57 (L) 150 - 400 K/uL    Comment: Immature Platelet Fraction may be clinically indicated, consider ordering this additional test [id number]    nRBC 0.0 0.0 - 0.2 %    Comment: Performed at [facility], [address] [place], [place] [address]  Troponin I (High Sensitivity)     Status: Abnormal   Collection Time: 06/18/23  4:05 AM  Result Value Ref Range   Troponin I (High Sensitivity) 28 (H) <18 ng/L    Comment: (NOTE) Elevated high sensitivity troponin I (hsTnI) values and significant  changes across serial measurements may suggest ACS but many other  chronic  and acute conditions are known to elevate hsTnI results.  Refer to the "Links" section for chest pain algorithms and additional  guidance. Performed at [facility], [address], [place], [place] [address]   Troponin I (High Sensitivity)     Status: Abnormal   Collection Time: 06/18/23  9:33 AM  Result Value Ref Range   Troponin I (High Sensitivity) 34 (H) <18 ng/L    Comment: (NOTE) Elevated high sensitivity troponin I (hsTnI) values and significant  changes across serial measurements may suggest ACS but many other  chronic and acute conditions are known to elevate hsTnI results.  Refer to the "Links" section for chest pain algorithms and additional  guidance. Performed at [facility], [address] [place], [place] [address]   Body fluid cell count with differential     Status: Abnormal   Collection Time: 06/18/23 11:45 AM  Result Value Ref Range   Fluid Type-FCT PLEURAL     Comment: CORRECTED ON 02/10 AT 1215: PREVIOUSLY REPORTED AS Pleural R   Color, Fluid YELLOW (A) YELLOW   Appearance, Fluid HAZY (A) CLEAR   Total Nucleated Cell Count, Fluid 393 0 - 1,000 cu mm   Neutrophil Count, Fluid 37 (H) 0 - 25 %   Lymphs, Fluid 43 %   Monocyte-Macrophage-Serous Fluid 20 (L) 50 - 90 %   Eos, Fluid 0 %   Other Cells,  Fluid MESOTHELIAL CELLS PRESENT %    Comment: Performed at [facility], [address] [place], [place] [address]  Protein, pleural or peritoneal fluid     Status: None   Collection Time: 06/18/23 11:45 AM  Result Value Ref Range   Total protein, fluid 4.7 g/dL    Comment: (NOTE) No normal range established for this test Results should be evaluated in conjunction with serum values    Fluid Type-FTP PLEURAL     Comment: Performed at [facility], [address], [place], [place] [address] CORRECTED ON 02/10 AT 1215: PREVIOUSLY REPORTED AS Pleural R   Glucose, pleural or peritoneal fluid     Status: None   Collection Time: 06/18/23 11:45 AM  Result Value Ref Range   Glucose, Fluid 83 mg/dL    Comment: (NOTE) No normal range established for this test Results should be evaluated in conjunction with serum values    Fluid Type-FGLU PLEURAL     Comment: Performed at [facility], [address], [place], [place] [address] CORRECTED ON 02/10 AT 1215: PREVIOUSLY REPORTED AS Pleural R   Albumin, pleural or peritoneal fluid      Status: None   Collection Time: 06/18/23 11:45 AM  Result Value Ref Range   Albumin, Fluid 2.1 g/dL    Comment: (NOTE) No normal range established for this test Results should be evaluated in conjunction with serum values    Fluid Type-FALB PLEURAL     Comment: Performed at [facility], [address] [place], [place] [address] CORRECTED ON 02/10 AT 1215: PREVIOUSLY REPORTED AS Pleural R   Magnesium     Status: None   Collection Time: 06/19/23  4:26 AM  Result Value Ref Range   Magnesium 1.9 1.7 - 2.4 mg/dL    Comment: Performed at [facility], [address], [place], [place] [address]  Phosphorus     Status: None   Collection Time: 06/19/23  4:26 AM  Result Value Ref Range   Phosphorus 3.2 2.5 - 4.6 mg/dL    Comment: Performed at [facility], [address],  [place], [place] [address]  CBC     Status: Abnormal   Collection Time: 06/19/23  4:26 AM   Result Value Ref Range   WBC 4.3 4.0 - 10.5 K/uL   RBC 2.20 (L) 4.22 - 5.81 MIL/uL   Hemoglobin 7.7 (L) 13.0 - 17.0 g/dL   HCT 19.1 (L) 47.8 - 29.5 %   MCV 106.4 (H) 80.0 - 100.0 fL   MCH 35.0 (H) 26.0 - 34.0 pg   MCHC 32.9 30.0 - 36.0 g/dL   RDW 62.1 (H) 30.8 - 65.7 %   Platelets 55 (L) 150 - 400 K/uL    Comment: SPECIMEN CHECKED FOR CLOTS Immature Platelet Fraction may be clinically indicated, consider ordering this additional test [id number] REPEATED TO VERIFY PLATELETS APPEAR DECREASED    nRBC 0.0 0.0 - 0.2 %    Comment: Performed at [facility], [address], [place], [place] [address]   US Abdomen Limited Result Date: 06/18/2023 CLINICAL DATA:  Transaminitis EXAM: ULTRASOUND ABDOMEN LIMITED RIGHT UPPER QUADRANT COMPARISON:  05/19/2023 FINDINGS: Gallbladder: No gallstones or wall thickening visualized. No sonographic Murphy sign noted by sonographer. Common bile duct: Diameter: 3 mm Liver: No focal lesion identified. Within normal limits in parenchymal echogenicity. Portal vein is patent on color Doppler imaging with normal direction of blood flow towards the liver. Other: Incidental right pleural effusion. IMPRESSION: 1. Incidental right pleural effusion. 2. Otherwise unremarkable right upper quadrant ultrasound. Electronically Signed   By: Sharlet Salina M.D.   On: 06/18/2023 16:02   US THORACENTESIS ASP PLEURAL SPACE W/IMG GUIDE Result Date: 06/18/2023 INDICATION: Symptomatic right sided pleural effusion EXAM: US THORACENTESIS ASP PLEURAL SPACE W/IMG GUIDE COMPARISON:  Chest CT-06/17/2023 MEDICATIONS: None. COMPLICATIONS: None immediate. TECHNIQUE: Informed written consent was obtained from the patient after a discussion of the risks, benefits and alternatives to treatment. A timeout was performed prior to the initiation of the procedure. Initial ultrasound scanning demonstrates a large right-sided pleural effusion. The lower chest was prepped and draped in the usual sterile  fashion. 1% lidocaine was used for local anesthesia. An ultrasound image was saved for documentation purposes. An 8 Fr Safe-T-Centesis catheter was introduced. The thoracentesis was performed. The catheter was removed and a dressing was applied. The patient tolerated the procedure well without immediate post procedural complication. The patient was escorted to have an upright chest radiograph. FINDINGS: A total of approximately 1.4 liters of serous fluid was removed. Requested samples were sent to the laboratory. IMPRESSION: Successful ultrasound-guided right sided thoracentesis yielding 1.4 liters of pleural fluid. Electronically Signed   By: Simonne Come M.D.   On: 06/18/2023 12:31   DG Chest 1 View Result Date: 06/18/2023 CLINICAL DATA:  Post right-sided thoracentesis EXAM: CHEST  1 VIEW COMPARISON:  06/17/2023; chest CT-06/17/2023 FINDINGS: Unchanged enlarged cardiac silhouette and mediastinal contours with atherosclerotic plaque thoracic aorta. Interval reduction/near resolution of right-sided pleural effusion post thoracentesis with aeration base. No pneumothorax. Pulmonary vasculature remains indistinct with cephalization of flow. Suspected trace left-sided pleural effusion. No acute osseous abnormalities. IMPRESSION: 1. Interval reduction/near resolution of right-sided pleural effusion post thoracentesis with aeration of the base. No pneumothorax. 2. Similar findings of cardiomegaly and pulmonary edema with suspected trace left-sided pleural effusion. Electronically Signed   By: Simonne Come M.D.   On: 06/18/2023 12:30   CT Chest W Contrast Result Date: 06/17/2023 CLINICAL DATA:  Pneumonia, complication suspected, xray done possible right sided pneumonia, persistent x1 month EXAM: CT CHEST WITH CONTRAST TECHNIQUE: Multidetector CT imaging of the chest was performed  during intravenous contrast administration. RADIATION DOSE REDUCTION: This exam was performed according to the departmental dose-optimization  program which includes automated exposure control, adjustment of the mA and/or kV according to patient size and/or use of iterative reconstruction technique. CONTRAST:  75mL OMNIPAQUE IOHEXOL 300 MG/ML  SOLN COMPARISON:  06/17/2023, 05/21/2023 FINDINGS: Cardiovascular: The heart is unremarkable without pericardial effusion. 4 cm ascending thoracic aortic aneurysm. No evidence of dissection. Atherosclerosis of the aorta and coronary vasculature. Mediastinum/Nodes: Persistent mediastinal adenopathy, with largest lymph node in the right paratracheal region measuring 2 cm in short axis. Thyroid, trachea, and esophagus are unremarkable. Lungs/Pleura: Moderate right pleural effusion, increased since prior exam. Pleural nodularity within the right anterior costophrenic angle concerning for pleural based metastases. There is progressive dense consolidation within the right lower lobe. The appearance is somewhat masslike, and underlying neoplasm is suspected. Multiple bilateral pulmonary nodules are again identified, not appreciably changed and suspicious for metastatic disease. Index nodules are as follows: Right middle lobe, 9 mm, image 95/3. Left lower lobe, 9 mm, image 101/3 Left upper lobe, 8 mm, image 40/3. No pneumothorax. Upper Abdomen: Soft tissue nodularity within the lower anterior mediastinum anterior to the right hemidiaphragm measures up to 1.9 cm, reference image 123/2, suspicious for metastatic disease. Small calcified gallstones are identified without cholecystitis. No other acute upper abdominal findings. Musculoskeletal: There is a lytic lesion within the right anterolateral aspect of the T8 vertebral body, reference image 85/2, new since prior study and concerning for bony metastasis. No other destructive bony abnormalities or acute fractures. Reconstructed images demonstrate no additional findings. IMPRESSION: 1. Progressive dense masslike consolidation within the right lower lobe, concerning for  underlying neoplasm. Further evaluation with PET scan may be useful if the patient would be a therapy candidate should neoplasm be detected. 2. Numerous bilateral pulmonary nodules, concerning for metastatic disease. 3. Progressive mediastinal lymphadenopathy, consistent with nodal metastases. 4. Enlarging right pleural effusion, with soft tissue nodularity along the right anterior costophrenic angle concerning for pleural based metastases. 5. New lytic lesion within the right anterolateral aspect of the T8 vertebral body, concerning for bony metastasis. 6. 4 cm ascending thoracic aortic aneurysm. Recommend annual imaging followup by CTA or MRA. This recommendation follows 2010 ACCF/AHA/AATS/ACR/ASA/SCA/SCAI/SIR/STS/SVM Guidelines for the Diagnosis and Management of Patients with Thoracic Aortic Disease. Circulation. 2010; 121: W295-A213. Aortic aneurysm NOS (ICD10-I71.9) 7.  Aortic Atherosclerosis (ICD10-I70.0). Electronically Signed   By: Sharlet Salina M.D.   On: 06/17/2023 23:42   DG Chest 2 View Result Date: 06/17/2023 CLINICAL DATA:  cough EXAM: CHEST - 2 VIEW COMPARISON:  Chest x-ray 05/19/2023 FINDINGS: The heart and mediastinal contours are unchanged. Atherosclerotic plaque. Right mid to lower lung zone airspace opacity. No pulmonary edema. Interval development of a moderate volume right pleural effusion. Pleural effusion. No pneumothorax. No acute osseous abnormality. IMPRESSION: 1. Interval development of a moderate volume right pleural effusion. 2. Right mid to lower lung zone airspace opacity. 3.  Aortic Atherosclerosis (ICD10-I70.0). Electronically Signed   By: Tish Frederickson M.D.   On: 06/17/2023 21:39   CT Head Wo Contrast Result Date: 06/17/2023 CLINICAL DATA:  Delirium Pt had worsening weakness past 2 days where he needed assistance getting off the toilet. Pt fell 1/30. Reports decreased appetite and started on megestro EXAM: CT HEAD WITHOUT CONTRAST TECHNIQUE: Contiguous axial images were  obtained from the base of the skull through the vertex without intravenous contrast. RADIATION DOSE REDUCTION: This exam was performed according to the departmental dose-optimization program which includes automated exposure control,  adjustment of the mA and/or kV according to patient size and/or use of iterative reconstruction technique. COMPARISON:  None Available. FINDINGS: Brain: Cerebral ventricle sizes are concordant with the degree of cerebral volume loss. Patchy and confluent areas of decreased attenuation are noted throughout the deep and periventricular white matter of the cerebral hemispheres bilaterally, compatible with chronic microvascular ischemic disease. Age-indeterminate right cerebellar loss of gray-white matter differentiation. No evidence of large-territorial acute infarction. No parenchymal hemorrhage. No mass lesion. No extra-axial collection. No mass effect or midline shift. No hydrocephalus. Basilar cisterns are patent. Vascular: No hyperdense vessel. Atherosclerotic calcifications are present within the cavernous internal carotid and vertebral arteries. Skull: No acute fracture or focal lesion. Sinuses/Orbits: Paranasal sinuses and mastoid air cells are clear. The orbits are unremarkable. Other: None. IMPRESSION: 1. 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 2. No acute intracranial hemorrhage. These results were called by telephone at the time of interpretation on 06/17/2023 at 9:33 pm to provider Tanda Rockers , who verbally acknowledged these results. Electronically Signed   By: Tish Frederickson M.D.   On: 06/17/2023 21:33     MEDICATIONS: I have reviewed the patient's current medications.     Assessment/Plan:  1.  Right lung mass with adenopathy and bilateral lung nodules: - Highly concerning for pulmonary neoplasm.  Thoracentesis on 06/18/2023 with 1.4 L removed right effusion.  Cytology from the fluid is pending. - Will do further workup as outpatient with PET scan and biopsy  if needed.  2.  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  He will continue folic acid 1 mg tablet daily.  3.  Generalized weakness: -He continues to be weak.  He is being discharged home to home health.  All questions were answered. The patient knows to call the clinic with any problems, questions or concerns. We can certainly see the patient much sooner if necessary.     Doreatha Massed

## 2023-06-20 DIAGNOSIS — R918 Other nonspecific abnormal finding of lung field: Secondary | ICD-10-CM | POA: Diagnosis not present

## 2023-06-20 DIAGNOSIS — R531 Weakness: Secondary | ICD-10-CM | POA: Diagnosis not present

## 2023-06-20 DIAGNOSIS — J90 Pleural effusion, not elsewhere classified: Secondary | ICD-10-CM | POA: Diagnosis not present

## 2023-06-20 MED ORDER — HYOSCYAMINE SULFATE 0.125 MG SL SUBL: 0.1250 mg | SUBLINGUAL_TABLET | SUBLINGUAL | 0 refills | Status: DC | PRN

## 2023-06-20 MED ORDER — DM-GUAIFENESIN ER 30-600 MG PO TB12: 1.0000 | ORAL_TABLET | Freq: Two times a day (BID) | ORAL | 0 refills | Status: DC

## 2023-06-20 NOTE — Care Management Important Message (Signed)
Important Message  Patient Details  Name: Aaron Sexton MRN: [id number] Date of Birth: 1945/12/08   Important Message Given:  N/A - LOS <3 / Initial given by admissions     Corey Harold 06/20/2023, 9:30 AM

## 2023-06-20 NOTE — Discharge Summary (Signed)
Physician Discharge Summary  Patient: Aaron Sexton:[id number] DOB: 04-17-1946   Code Status: Full Code Admit date: 06/17/2023 Discharge date: 06/20/2023 Disposition: Home, PT and OT PCP: Karie Schwalbe, MD  Recommendations for Outpatient Follow-up:  Follow up with PCP within 1-2 weeks Regarding general hospital follow up and preventative care Follow up with heme/oncology Hgb 7.7 on day of discharge Final results on pleural aspiration still pending.  Follow up with hospice. Family and patient do not want to go to SNF. They would like to be discharged home and pursue comfort care.   Discharge Diagnoses:  Principal Problem:   Generalized weakness Active Problems:   Myelodysplastic syndrome (HCC)   Essential hypertension, benign   Hyponatremia   Mixed hyperlipidemia   Macrocytic anemia   Failure to thrive in adult   Pleural effusion on right   Multiple lesions of metastatic malignancy (HCC)   Thrombocytopenia (HCC)   Transaminitis   Protein-calorie malnutrition, severe   Lung mass   Pleural effusion  Brief Hospital Course Summary: Aaron Sexton is a 78 y.o. 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 He was started on Megace 2/5 for appetite stimulation. Patient has lost over 20 pounds within the last 6 to 7 weeks per son at bedside.   ED Course: BP was 130/56, other vital signs are within the normal range.  microcytic anemia, thrombocytopenia.  BMP was normal except for sodium of 129, bicarb 20, glucose 105.  Albumin 2.5, AST 144, ALT 108, ALP 140, troponin 35 > 37.  Urinalysis was normal, lipase 39.  Influenza A, B, SARS, respiratory, RSV was negative. CT chest with contrast: 1. Progressive dense  masslike consolidation within the right lower lobe, concerning for underlying neoplasm. 2. Numerous bilateral pulmonary nodules, concerning for metastatic disease. 3. Progressive mediastinal lymphadenopathy, consistent with nodal metastases. 4. Enlarging right pleural effusion, with soft tissue nodularity along the right anterior costophrenic angle concerning for pleural based metastases. 5. New lytic lesion within the right anterolateral aspect of the T8 vertebral body, concerning for bony metastasis. 6. 4 cm ascending thoracic aortic aneurysm  IV hydration with 1 L NS was provided.  Hospitalist was asked admit patient for further evaluation and management.  IR consulted and performed thoracentesis with 1.4L right chest removed. Results are pending.  Oncology was consulted to guide management of presumed malignancy.  Patient remained stable ORA and able to dc home while awaiting the lab results from thoracentesis.  PT/OT evaluated and recommended HH.  Family requested hospital bed at home which was ordered but they said they needed another day to move things around at home to accommodate the bed.  He is discharged in stable condition and awaiting home arrangements to go home.   All other chronic conditions were treated with home medications.    Discharge Condition: Good, improved Recommended discharge diet: Regular healthy diet  Consultations: IR Oncology   Procedures/Studies: Thoracentesis US guided  Discharge Instructions     Discharge patient   Complete by: As directed    Discharge disposition: 06-Home-Health Care Svc   Discharge patient date: 06/19/2023   Discharge patient   Complete by: As directed    Discharge disposition: 51-Hospice/Medical Facility   Discharge patient date: 06/20/2023   Discharge wound care:   Complete  by: As directed    Sacral wound- rotate patient every 2 hours, clean and dry dressing   Increase activity slowly   Complete by: As directed        Allergies as of 06/20/2023       Reactions   Cardizem [diltiazem Hcl] Hives, Rash        Medication List     STOP taking these medications    diphenhydrAMINE 25 mg capsule Commonly known as: BENADRYL   pravastatin 40 MG tablet Commonly known as: PRAVACHOL       TAKE these medications    acetaminophen 500 MG tablet Commonly known as: TYLENOL Take 1,000 mg by mouth every 6 (six) hours as needed for mild pain (pain score 1-3).   aspirin 81 MG tablet Take 81 mg by mouth every evening.   clopidogrel 75 MG tablet Commonly known as: PLAVIX TAKE 1 TABLET(75 MG) BY MOUTH DAILY WITH BREAKFAST   dextromethorphan-guaiFENesin 30-600 MG 12hr tablet Commonly known as: MUCINEX DM Take 1 tablet by mouth 2 (two) times daily.   Fish Oil 1000 MG Caps Take 1,000 mg by mouth daily.   hyoscyamine 0.125 MG SL tablet Commonly known as: LEVSIN SL Place 1 tablet (0.125 mg total) under the tongue every 4 (four) hours as needed (excess oral secretions).   lenalidomide 5 MG capsule Commonly known as: REVLIMID Take 1 capsule (5 mg total) by mouth daily. TAKE 1 CAPSULE (5MG ) BY MOUTH EVERY DAY   loperamide 2 MG tablet Commonly known as: IMODIUM A-D Take 2 mg by mouth 4 (four) times daily as needed for diarrhea or loose stools.   losartan 25 MG tablet Commonly known as: COZAAR TAKE 1 TABLET(25 MG) BY MOUTH DAILY   megestrol 400 MG/10ML suspension Commonly known as: MEGACE Take 10 mLs (400 mg total) by mouth 2 (two) times daily.   metoprolol succinate 100 MG 24 hr tablet Commonly known as: TOPROL-XL TAKE 1 TABLET(100 MG) BY MOUTH DAILY WITH OR IMMEDIATELY FOLLOWING A MEAL   nitroGLYCERIN 0.4 MG SL tablet Commonly known as: NITROSTAT Place 1 tablet (0.4 mg total) under the tongue every 5 (five) minutes as needed for chest pain.   omeprazole 20 MG capsule Commonly known as: PRILOSEC Take 20 mg by mouth every other day.   traMADol 50 MG tablet Commonly known as: ULTRAM TAKE  1 TABLET BY MOUTH EVERY 8 HOURS AS NEEDED What changed: reasons to take this   Vitamin B-12 500 MCG Subl Place 500 mcg under the tongue daily.               Durable Medical Equipment  (From admission, onward)           Start     Ordered   06/19/23 1247  For home use only DME Hospital bed  Once       Question Answer Comment  Length of Need 12 Months   Bed type Semi-electric      06/19/23 1246   06/18/23 0922  For home use only DME 3 n 1  Once        06/18/23 1610              Discharge Care Instructions  (From admission, onward)           Start     Ordered   06/20/23 0000  Discharge wound care:       Comments: Sacral wound- rotate patient every 2 hours, clean and dry dressing   06/20/23 1122  Follow-up Information     Doreatha Massed, MD. Schedule an appointment as soon as possible for a visit in 3 day(s).   Specialty: Hematology Contact information: [address] [place] [address] [phone]         [facility] Follow up.   Why: Your BSC and Hospital bed will be delivered today. They will call you once they are on the way with delivery.        [facility] Follow up.   Specialty: Hospice and Palliative Medicine Why: This will be your hospice agency for home Contact information: [address] [facility] [place] [address] [phone]                Subjective   Pt reports feeling well. Denies chest pain, SOB. No palpitations. Able to tolerate diet. Some generalized weakness unchanged from his recent baseline.   All questions and concerns were addressed at time of discharge of patient and wife at bedside.  Objective  Blood pressure 126/68, pulse 100, temperature 97.9 F (36.6 C), temperature source Oral, resp. rate 18, height 5\' 7"  (1.702 m), weight 78.5 kg, SpO2 97%.   General: Pt is alert, awake, not in acute distress Cardiovascular: RRR, S1/S2 +, no rubs, no gallops Respiratory:  CTA bilaterally, no wheezing, no rhonchi. Posterior back without drainage or erythema. Clean bandage over insertion site of thoracentesis.  Abdominal: Soft, NT, ND, bowel sounds + Extremities: no edema, no cyanosis Clean bandage over the sacral area  The results of significant diagnostics from this hospitalization (including imaging, microbiology, ancillary and laboratory) are listed below for reference.   Imaging studies: US Abdomen Limited Result Date: 06/18/2023 CLINICAL DATA:  Transaminitis EXAM: ULTRASOUND ABDOMEN LIMITED RIGHT UPPER QUADRANT COMPARISON:  05/19/2023 FINDINGS: Gallbladder: No gallstones or wall thickening visualized. No sonographic Murphy sign noted by sonographer. Common bile duct: Diameter: 3 mm Liver: No focal lesion identified. Within normal limits in parenchymal echogenicity. Portal vein is patent on color Doppler imaging with normal direction of blood flow towards the liver. Other: Incidental right pleural effusion. IMPRESSION: 1. Incidental right pleural effusion. 2. Otherwise unremarkable right upper quadrant ultrasound. Electronically Signed   By: Sharlet Salina M.D.   On: 06/18/2023 16:02   US THORACENTESIS ASP PLEURAL SPACE W/IMG GUIDE Result Date: 06/18/2023 INDICATION: Symptomatic right sided pleural effusion EXAM: US THORACENTESIS ASP PLEURAL SPACE W/IMG GUIDE COMPARISON:  Chest CT-06/17/2023 MEDICATIONS: None. COMPLICATIONS: None immediate. TECHNIQUE: Informed written consent was obtained from the patient after a discussion of the risks, benefits and alternatives to treatment. A timeout was performed prior to the initiation of the procedure. Initial ultrasound scanning demonstrates a large right-sided pleural effusion. The lower chest was prepped and draped in the usual sterile fashion. 1% lidocaine was used for local anesthesia. An ultrasound image was saved for documentation purposes. An 8 Fr Safe-T-Centesis catheter was introduced. The thoracentesis was performed.  The catheter was removed and a dressing was applied. The patient tolerated the procedure well without immediate post procedural complication. The patient was escorted to have an upright chest radiograph. FINDINGS: A total of approximately 1.4 liters of serous fluid was removed. Requested samples were sent to the laboratory. IMPRESSION: Successful ultrasound-guided right sided thoracentesis yielding 1.4 liters of pleural fluid. Electronically Signed   By: Simonne Come M.D.   On: 06/18/2023 12:31   DG Chest 1 View Result Date: 06/18/2023 CLINICAL DATA:  Post right-sided thoracentesis EXAM: CHEST  1 VIEW COMPARISON:  06/17/2023; chest CT-06/17/2023 FINDINGS: Unchanged enlarged cardiac silhouette and  mediastinal contours with atherosclerotic plaque thoracic aorta. Interval reduction/near resolution of right-sided pleural effusion post thoracentesis with aeration base. No pneumothorax. Pulmonary vasculature remains indistinct with cephalization of flow. Suspected trace left-sided pleural effusion. No acute osseous abnormalities. IMPRESSION: 1. Interval reduction/near resolution of right-sided pleural effusion post thoracentesis with aeration of the base. No pneumothorax. 2. Similar findings of cardiomegaly and pulmonary edema with suspected trace left-sided pleural effusion. Electronically Signed   By: Simonne Come M.D.   On: 06/18/2023 12:30   CT Chest W Contrast Result Date: 06/17/2023 CLINICAL DATA:  Pneumonia, complication suspected, xray done possible right sided pneumonia, persistent x1 month EXAM: CT CHEST WITH CONTRAST TECHNIQUE: Multidetector CT imaging of the chest was performed during intravenous contrast administration. RADIATION DOSE REDUCTION: This exam was performed according to the departmental dose-optimization program which includes automated exposure control, adjustment of the mA and/or kV according to patient size and/or use of iterative reconstruction technique. CONTRAST:  75mL OMNIPAQUE IOHEXOL  300 MG/ML  SOLN COMPARISON:  06/17/2023, 05/21/2023 FINDINGS: Cardiovascular: The heart is unremarkable without pericardial effusion. 4 cm ascending thoracic aortic aneurysm. No evidence of dissection. Atherosclerosis of the aorta and coronary vasculature. Mediastinum/Nodes: Persistent mediastinal adenopathy, with largest lymph node in the right paratracheal region measuring 2 cm in short axis. Thyroid, trachea, and esophagus are unremarkable. Lungs/Pleura: Moderate right pleural effusion, increased since prior exam. Pleural nodularity within the right anterior costophrenic angle concerning for pleural based metastases. There is progressive dense consolidation within the right lower lobe. The appearance is somewhat masslike, and underlying neoplasm is suspected. Multiple bilateral pulmonary nodules are again identified, not appreciably changed and suspicious for metastatic disease. Index nodules are as follows: Right middle lobe, 9 mm, image 95/3. Left lower lobe, 9 mm, image 101/3 Left upper lobe, 8 mm, image 40/3. No pneumothorax. Upper Abdomen: Soft tissue nodularity within the lower anterior mediastinum anterior to the right hemidiaphragm measures up to 1.9 cm, reference image 123/2, suspicious for metastatic disease. Small calcified gallstones are identified without cholecystitis. No other acute upper abdominal findings. Musculoskeletal: There is a lytic lesion within the right anterolateral aspect of the T8 vertebral body, reference image 85/2, new since prior study and concerning for bony metastasis. No other destructive bony abnormalities or acute fractures. Reconstructed images demonstrate no additional findings. IMPRESSION: 1. Progressive dense masslike consolidation within the right lower lobe, concerning for underlying neoplasm. Further evaluation with PET scan may be useful if the patient would be a therapy candidate should neoplasm be detected. 2. Numerous bilateral pulmonary nodules, concerning for  metastatic disease. 3. Progressive mediastinal lymphadenopathy, consistent with nodal metastases. 4. Enlarging right pleural effusion, with soft tissue nodularity along the right anterior costophrenic angle concerning for pleural based metastases. 5. New lytic lesion within the right anterolateral aspect of the T8 vertebral body, concerning for bony metastasis. 6. 4 cm ascending thoracic aortic aneurysm. Recommend annual imaging followup by CTA or MRA. This recommendation follows 2010 ACCF/AHA/AATS/ACR/ASA/SCA/SCAI/SIR/STS/SVM Guidelines for the Diagnosis and Management of Patients with Thoracic Aortic Disease. Circulation. 2010; 121: Z610-R604. Aortic aneurysm NOS (ICD10-I71.9) 7.  Aortic Atherosclerosis (ICD10-I70.0). Electronically Signed   By: Sharlet Salina M.D.   On: 06/17/2023 23:42   DG Chest 2 View Result Date: 06/17/2023 CLINICAL DATA:  cough EXAM: CHEST - 2 VIEW COMPARISON:  Chest x-ray 05/19/2023 FINDINGS: The heart and mediastinal contours are unchanged. Atherosclerotic plaque. Right mid to lower lung zone airspace opacity. No pulmonary edema. Interval development of a moderate volume right pleural effusion. Pleural effusion. No pneumothorax. No  acute osseous abnormality. IMPRESSION: 1. Interval development of a moderate volume right pleural effusion. 2. Right mid to lower lung zone airspace opacity. 3.  Aortic Atherosclerosis (ICD10-I70.0). Electronically Signed   By: Tish Frederickson M.D.   On: 06/17/2023 21:39   CT Head Wo Contrast Result Date: 06/17/2023 CLINICAL DATA:  Delirium Pt had worsening weakness past 2 days where he needed assistance getting off the toilet. Pt fell 1/30. Reports decreased appetite and started on megestro EXAM: CT HEAD WITHOUT CONTRAST TECHNIQUE: Contiguous axial images were obtained from the base of the skull through the vertex without intravenous contrast. RADIATION DOSE REDUCTION: This exam was performed according to the departmental dose-optimization program which  includes automated exposure control, adjustment of the mA and/or kV according to patient size and/or use of iterative reconstruction technique. COMPARISON:  None Available. FINDINGS: Brain: Cerebral ventricle sizes are concordant with the degree of cerebral volume loss. Patchy and confluent areas of decreased attenuation are noted throughout the deep and periventricular white matter of the cerebral hemispheres bilaterally, compatible with chronic microvascular ischemic disease. Age-indeterminate right cerebellar loss of gray-white matter differentiation. No evidence of large-territorial acute infarction. No parenchymal hemorrhage. No mass lesion. No extra-axial collection. No mass effect or midline shift. No hydrocephalus. Basilar cisterns are patent. Vascular: No hyperdense vessel. Atherosclerotic calcifications are present within the cavernous internal carotid and vertebral arteries. Skull: No acute fracture or focal lesion. Sinuses/Orbits: Paranasal sinuses and mastoid air cells are clear. The orbits are unremarkable. Other: None. IMPRESSION: 1. 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 2. No acute intracranial hemorrhage. These results were called by telephone at the time of interpretation on 06/17/2023 at 9:33 pm to provider Tanda Rockers , who verbally acknowledged these results. Electronically Signed   By: Tish Frederickson M.D.   On: 06/17/2023 21:33   [place] CHEST (PLEURAL EFFUSION) Result Date: 05/22/2023 INDICATION: History of worsening shortness of breath. Found to have a small right-sided pleural effusion. Request is for therapeutic and diagnostic right-sided thoracentesis EXAM: CHEST ULTRASOUND COMPARISON:  CT angio chest dated May 21, 2023 FINDINGS: Small right-sided pleural effusion. IMPRESSION: Small left-sided pleural effusion. After discussion of the risks versus benefits of the procedure the patient elected to defer the thoracentesis at this time. Read by: Anders Grant, NP Electronically  Signed   By: Judie Petit.  Shick M.D.   On: 05/22/2023 11:10    Labs: Basic Metabolic Panel: Recent Labs  Lab 06/17/23 1927 06/18/23 0405 06/19/23 0426  NA 129* 128*  --   K 3.9 3.8  --   CL 99 101  --   CO2 20* 20*  --   GLUCOSE 105* 85  --   BUN 19 17  --   CREATININE 1.10 0.95  --   CALCIUM 10.0 9.7  --   MG  --   --  1.9  PHOS  --   --  3.2   CBC: Recent Labs  Lab 06/17/23 1927 06/18/23 0405 06/19/23 0426  WBC 4.3 3.7* 4.3  NEUTROABS 2.8  --   --   HGB 7.5* 7.0* 7.7*  HCT 22.6* 21.0* 23.4*  MCV 106.1* 107.7* 106.4*  PLT 62* 57* 55*   Microbiology: Results for orders placed or performed during the hospital encounter of 06/17/23  Resp panel by RT-PCR (RSV, Flu A&B, Covid) Anterior Nasal Swab     Status: None   Collection Time: 06/17/23  7:26 PM   Specimen: Anterior Nasal Swab  Result Value Ref Range Status   SARS Coronavirus 2 by RT PCR  NEGATIVE NEGATIVE Final    Comment: (NOTE) SARS-CoV-2 target nucleic acids are NOT DETECTED.  The SARS-CoV-2 RNA is generally detectable in upper respiratory specimens during the acute phase of infection. The lowest concentration of SARS-CoV-2 viral copies this assay can detect is 138 copies/mL. A negative result does not preclude SARS-Cov-2 infection and should not be used as the sole basis for treatment or other patient management decisions. A negative result may occur with  improper specimen collection/handling, submission of specimen other than nasopharyngeal swab, presence of viral mutation(s) within the areas targeted by this assay, and inadequate number of viral copies(<138 copies/mL). A negative result must be combined with clinical observations, patient history, and epidemiological information. The expected result is Negative.  Fact Sheet for Patients:  BloggerCourse.com  Fact Sheet for Healthcare Providers:  SeriousBroker.it  This test is no t yet approved or cleared by  the [place] FDA and  has been authorized for detection and/or diagnosis of SARS-CoV-2 by FDA under an Emergency Use Authorization (EUA). This EUA will remain  in effect (meaning this test can be used) for the duration of the COVID-19 declaration under Section 564(b)(1) of the Act, 21 U.S.C.section 360bbb-3(b)(1), unless the authorization is terminated  or revoked sooner.       Influenza A by PCR NEGATIVE NEGATIVE Final   Influenza B by PCR NEGATIVE NEGATIVE Final    Comment: (NOTE) The Xpert Xpress SARS-CoV-2/FLU/RSV plus assay is intended as an aid in the diagnosis of influenza from Nasopharyngeal swab specimens and should not be used as a sole basis for treatment. Nasal washings and aspirates are unacceptable for Xpert Xpress SARS-CoV-2/FLU/RSV testing.  Fact Sheet for Patients: BloggerCourse.com  Fact Sheet for Healthcare Providers: SeriousBroker.it  This test is not yet approved or cleared by the [place] FDA and has been authorized for detection and/or diagnosis of SARS-CoV-2 by FDA under an Emergency Use Authorization (EUA). This EUA will remain in effect (meaning this test can be used) for the duration of the COVID-19 declaration under Section 564(b)(1) of the Act, 21 U.S.C. section 360bbb-3(b)(1), unless the authorization is terminated or revoked.     Resp Syncytial Virus by PCR NEGATIVE NEGATIVE Final    Comment: (NOTE) Fact Sheet for Patients: BloggerCourse.com  Fact Sheet for Healthcare Providers: SeriousBroker.it  This test is not yet approved or cleared by the [place] FDA and has been authorized for detection and/or diagnosis of SARS-CoV-2 by FDA under an Emergency Use Authorization (EUA). This EUA will remain in effect (meaning this test can be used) for the duration of the COVID-19 declaration under Section 564(b)(1) of the Act, 21  U.S.C. section 360bbb-3(b)(1), unless the authorization is terminated or revoked.  Performed at [facility], [address] [place], [place] [address]     Time coordinating discharge: Over 30 minutes  Leeroy Bock, MD  [facility] 06/20/2023, 3:59 PM

## 2023-06-20 NOTE — TOC Transition Note (Addendum)
Transition of Care [facility]) - Discharge Note   Patient Details  Name: Aaron Sexton MRN: [id number] Date of Birth: 05-04-1946  Transition of Care [facility]) CM/SW Contact:  Isabella Bowens, LCSWA Phone Number: 06/20/2023, 10:03 AM   Clinical Narrative:    CSW spoke with spouse who is at bedside regarding DC today. Spouse shared that patient is weak this morning and will need to be transported by ambulance. CSW notified nurse. CSW also shared with spouse that patient hospital bed and [facility] will be delivered today , per [facility] with [employer] and they will call son once they are on route to drop off. Morrie Sheldon with [facility] was contacted and made aware that patient is discharging today since they will provide palliative in the home. TOC signing off.   MD secure messaged CSW and shared that patient is even more weaker today and that family is wanting hospice in the home instead of palliative. CSW reached out Morrie Sheldon with [employer] who stated that she will visit patient and family at bedside today , to discuss goals of care for Hospice.    Morrie Sheldon spoke with patient and spouse at bedside, both are agreeable to Hospice at home. Morrie Sheldon is working on getting patient bed and BSC delivered today , MD aware and shared that pt can still DC today as long as equipment is delivered today and will place order for hospice.   Final next level of care: Home/Self Care Barriers to Discharge: Barriers Resolved   Patient Goals and CMS Choice Patient states their goals for this hospitalization and ongoing recovery are:: return home CMS Medicare.gov Compare Post Acute Care list provided to:: Patient Represenative (must comment) (Spouse - Annette) Choice offered to / list presented to : Spouse      Discharge Placement   Patient to be transferred to facility by: Ambulance - back home Name of family member notified: Drinda Butts - Spouse Patient and family notified of of transfer: 06/20/23  Discharge Plan and  Services Additional resources added to the After Visit Summary for   In-house Referral: Clinical Social Work   Post Acute Care Choice: Home Health, Durable Medical Equipment          DME Arranged: 3-N-1, Hospital bed DME Agency: [employer] Date DME Agency Contacted: 06/20/23 Time DME Agency Contacted: 1002 Representative spoke with at DME Agency: Ian Malkin            Social Drivers of Health (SDOH) Interventions SDOH Screenings   Food Insecurity: No Food Insecurity (06/18/2023)  Housing: Low Risk  (06/18/2023)  Transportation Needs: No Transportation Needs (06/18/2023)  Utilities: Not At Risk (06/18/2023)  Recent Concern: Utilities - At Risk (05/21/2023)  Alcohol Screen: Low Risk  (03/10/2020)  Depression (PHQ2-9): Low Risk  (02/12/2023)  Financial Resource Strain: Low Risk  (03/10/2020)  Physical Activity: Inactive (03/10/2020)  Social Connections: Socially Integrated (06/18/2023)  Stress: No Stress Concern Present (03/10/2020)  Tobacco Use: Medium Risk (06/18/2023)     Readmission Risk Interventions    06/20/2023    9:54 AM 01/12/2022   10:42 AM 12/19/2021   10:04 AM  Readmission Risk Prevention Plan  Transportation Screening Complete Complete Complete  PCP or Specialist Appt within 5-7 Days  Complete Complete  Home Care Screening Complete Complete Complete  Medication Review (RN CM) Complete Complete Complete

## 2023-06-20 NOTE — Progress Notes (Signed)
Went over discharge instructions w/ pt wife.

## 2023-06-20 NOTE — Progress Notes (Signed)
[facility] Liaison Note  Received request from [facility] for hospice services at home after discharge. Spoke with patient's son, Aaron Sexton to initiate education related to hospice philosophy, services and team approach to care. Son verbalized understanding of information given. Per discussion, the plan is for discharge home today after DME is delivered.  DME needs discussed. Patient has the following equipment in the home: walker Family requests the following equipment for delivery: hospital bed, [place] [place]  Please send signed and completed DNR home with patient/family. Please provide prescriptions at discharge as needed to ensure ongoing symptom management.  [facility] information and contact numbers given to [employer]. Please call with any concerns.  Thank you for the opportunity to participate in this patient's care.   Henderson Newcomer, LPN [facility] Liaison [phone]

## 2023-06-21 ENCOUNTER — Telehealth: Payer: Self-pay

## 2023-06-21 LAB — CYTOLOGY - NON PAP

## 2023-06-21 NOTE — Telephone Encounter (Signed)
Copied from CRM [id number]. Topic: Clinical - Medical Advice >> Jun 20, 2023  4:29 PM Isabell A wrote: Reason for CRM: Cordelia Pen from [facility] states they received a referral for hospice, patients family would like to confirm if he has 6 months or less & will Dr.Letvak serve as an attending of record.   Callback number: [phone]

## 2023-06-21 NOTE — Telephone Encounter (Signed)
Spoke to [place] at [facility]

## 2023-06-22 LAB — FLOW CYTOMETRY REQUEST - FLUID (INPATIENT)

## 2023-06-26 ENCOUNTER — Other Ambulatory Visit: Payer: Self-pay | Admitting: Cardiology

## 2023-06-27 ENCOUNTER — Inpatient Hospital Stay: Payer: Medicare PPO | Admitting: Hematology

## 2023-06-27 ENCOUNTER — Inpatient Hospital Stay: Payer: Medicare PPO

## 2023-07-03 ENCOUNTER — Telehealth: Payer: Self-pay | Admitting: Internal Medicine

## 2023-07-03 NOTE — Telephone Encounter (Signed)
 Copied from CRM [id number]. Topic: General - Deceased Patient >> 07/06/23  8:19 AM Almira Coaster wrote: Name of caller: Mariella Saa  Date of death: July 02, 2023   Name of funeral home: [facility] [employer] [facility] [employer] [facility]  Phone number of funeral home: [phone]  Provider that needs to sign form: No  Timeline for signing: N/A

## 2023-07-07 DEATH — deceased

## 2023-08-16 ENCOUNTER — Ambulatory Visit: Payer: Medicare PPO | Admitting: Internal Medicine

## 2023-09-18 ENCOUNTER — Encounter (HOSPITAL_COMMUNITY): Payer: Medicare PPO

## 2023-09-18 ENCOUNTER — Ambulatory Visit: Payer: Medicare PPO | Admitting: Vascular Surgery

## 2023-09-18 ENCOUNTER — Other Ambulatory Visit (HOSPITAL_COMMUNITY): Payer: Medicare PPO

## 2023-10-30 NOTE — Progress Notes (Signed)
 Lab no charge
